# Patient Record
Sex: Female | Born: 1950
Health system: Southern US, Community
[De-identification: ages and names within clinical notes are randomized; demographics above are authoritative.]

## PROBLEM LIST (undated history)

## (undated) DIAGNOSIS — I10 Essential (primary) hypertension: Secondary | ICD-10-CM

## (undated) DIAGNOSIS — I509 Heart failure, unspecified: Secondary | ICD-10-CM

## (undated) DIAGNOSIS — G8190 Hemiplegia, unspecified affecting unspecified side: Secondary | ICD-10-CM

## (undated) DIAGNOSIS — D571 Sickle-cell disease without crisis: Secondary | ICD-10-CM

## (undated) DIAGNOSIS — I639 Cerebral infarction, unspecified: Secondary | ICD-10-CM

## (undated) DIAGNOSIS — R569 Unspecified convulsions: Secondary | ICD-10-CM

## (undated) DIAGNOSIS — E559 Vitamin D deficiency, unspecified: Secondary | ICD-10-CM

## (undated) DIAGNOSIS — D126 Benign neoplasm of colon, unspecified: Secondary | ICD-10-CM

## (undated) DIAGNOSIS — E785 Hyperlipidemia, unspecified: Secondary | ICD-10-CM

## (undated) HISTORY — DX: Unspecified convulsions: R56.9

## (undated) HISTORY — DX: Heart failure, unspecified: I50.9

## (undated) HISTORY — PX: HIP SURGERY: SHX245

## (undated) HISTORY — DX: Hyperlipidemia, unspecified: E78.5

## (undated) HISTORY — PX: TUBAL LIGATION: SHX77

## (undated) HISTORY — DX: Sickle-cell disease without crisis: D57.1

## (undated) HISTORY — DX: Hemiplegia, unspecified affecting unspecified side: G81.90

## (undated) HISTORY — PX: ABDOMINAL HYSTERECTOMY: SHX81

## (undated) HISTORY — DX: Cerebral infarction, unspecified: I63.9

## (undated) HISTORY — PX: FRACTURE SURGERY: SHX138

## (undated) HISTORY — DX: Benign neoplasm of colon, unspecified: D12.6

## (undated) HISTORY — DX: Vitamin D deficiency, unspecified: E55.9

---

## 1998-04-28 ENCOUNTER — Other Ambulatory Visit: Admission: RE | Admit: 1998-04-28 | Discharge: 1998-04-28 | Payer: Self-pay | Admitting: Obstetrics and Gynecology

## 2000-05-29 ENCOUNTER — Ambulatory Visit (HOSPITAL_COMMUNITY): Admission: RE | Admit: 2000-05-29 | Discharge: 2000-05-29 | Payer: Self-pay | Admitting: Gastroenterology

## 2000-06-03 ENCOUNTER — Other Ambulatory Visit: Admission: RE | Admit: 2000-06-03 | Discharge: 2000-06-03 | Payer: Self-pay | Admitting: Obstetrics and Gynecology

## 2003-08-27 ENCOUNTER — Ambulatory Visit (HOSPITAL_COMMUNITY): Admission: RE | Admit: 2003-08-27 | Discharge: 2003-08-27 | Payer: Self-pay | Admitting: Gastroenterology

## 2005-08-23 ENCOUNTER — Emergency Department (HOSPITAL_COMMUNITY): Admission: EM | Admit: 2005-08-23 | Discharge: 2005-08-23 | Payer: Self-pay | Admitting: Emergency Medicine

## 2005-09-06 ENCOUNTER — Ambulatory Visit (HOSPITAL_COMMUNITY): Admission: RE | Admit: 2005-09-06 | Discharge: 2005-09-06 | Payer: Self-pay | Admitting: Family Medicine

## 2007-01-12 ENCOUNTER — Emergency Department (HOSPITAL_COMMUNITY): Admission: EM | Admit: 2007-01-12 | Discharge: 2007-01-12 | Payer: Self-pay | Admitting: Emergency Medicine

## 2007-04-12 ENCOUNTER — Emergency Department (HOSPITAL_COMMUNITY): Admission: EM | Admit: 2007-04-12 | Discharge: 2007-04-12 | Payer: Self-pay | Admitting: Emergency Medicine

## 2007-12-23 DIAGNOSIS — I639 Cerebral infarction, unspecified: Secondary | ICD-10-CM

## 2007-12-23 HISTORY — DX: Cerebral infarction, unspecified: I63.9

## 2008-01-13 ENCOUNTER — Ambulatory Visit: Payer: Self-pay | Admitting: Cardiology

## 2008-01-13 ENCOUNTER — Inpatient Hospital Stay (HOSPITAL_COMMUNITY): Admission: EM | Admit: 2008-01-13 | Discharge: 2008-02-04 | Payer: Self-pay | Admitting: Emergency Medicine

## 2008-01-14 ENCOUNTER — Encounter (INDEPENDENT_AMBULATORY_CARE_PROVIDER_SITE_OTHER): Payer: Self-pay | Admitting: Neurology

## 2008-01-15 ENCOUNTER — Encounter (INDEPENDENT_AMBULATORY_CARE_PROVIDER_SITE_OTHER): Payer: Self-pay | Admitting: Neurology

## 2008-01-20 ENCOUNTER — Ambulatory Visit: Payer: Self-pay | Admitting: Physical Medicine & Rehabilitation

## 2008-01-23 DIAGNOSIS — E559 Vitamin D deficiency, unspecified: Secondary | ICD-10-CM

## 2008-01-23 HISTORY — DX: Vitamin D deficiency, unspecified: E55.9

## 2008-01-27 ENCOUNTER — Encounter (INDEPENDENT_AMBULATORY_CARE_PROVIDER_SITE_OTHER): Payer: Self-pay | Admitting: Neurology

## 2008-08-03 ENCOUNTER — Encounter: Admission: RE | Admit: 2008-08-03 | Discharge: 2008-09-01 | Payer: Self-pay | Admitting: Pediatrics

## 2008-08-26 ENCOUNTER — Ambulatory Visit: Payer: Self-pay | Admitting: Family Medicine

## 2008-08-26 ENCOUNTER — Inpatient Hospital Stay (HOSPITAL_COMMUNITY): Admission: EM | Admit: 2008-08-26 | Discharge: 2008-09-03 | Payer: Self-pay | Admitting: Emergency Medicine

## 2008-10-14 ENCOUNTER — Encounter: Admission: RE | Admit: 2008-10-14 | Discharge: 2009-01-03 | Payer: Self-pay | Admitting: Orthopaedic Surgery

## 2008-10-29 ENCOUNTER — Encounter: Admission: RE | Admit: 2008-10-29 | Discharge: 2009-01-03 | Payer: Self-pay | Admitting: Pediatrics

## 2009-02-16 DIAGNOSIS — I639 Cerebral infarction, unspecified: Secondary | ICD-10-CM | POA: Insufficient documentation

## 2009-02-16 DIAGNOSIS — Z7901 Long term (current) use of anticoagulants: Secondary | ICD-10-CM | POA: Insufficient documentation

## 2009-03-07 ENCOUNTER — Encounter: Admission: RE | Admit: 2009-03-07 | Discharge: 2009-04-20 | Payer: Self-pay | Admitting: Family

## 2009-09-03 IMAGING — CR DG ABD PORTABLE 1V
1 series · 1 of 1 positions shown · non-contrast
Comparison: 01/18/2008.

CLINICAL DATA: Feeding tube placement.

ABDOMEN - 1 VIEW

[view not recorded]
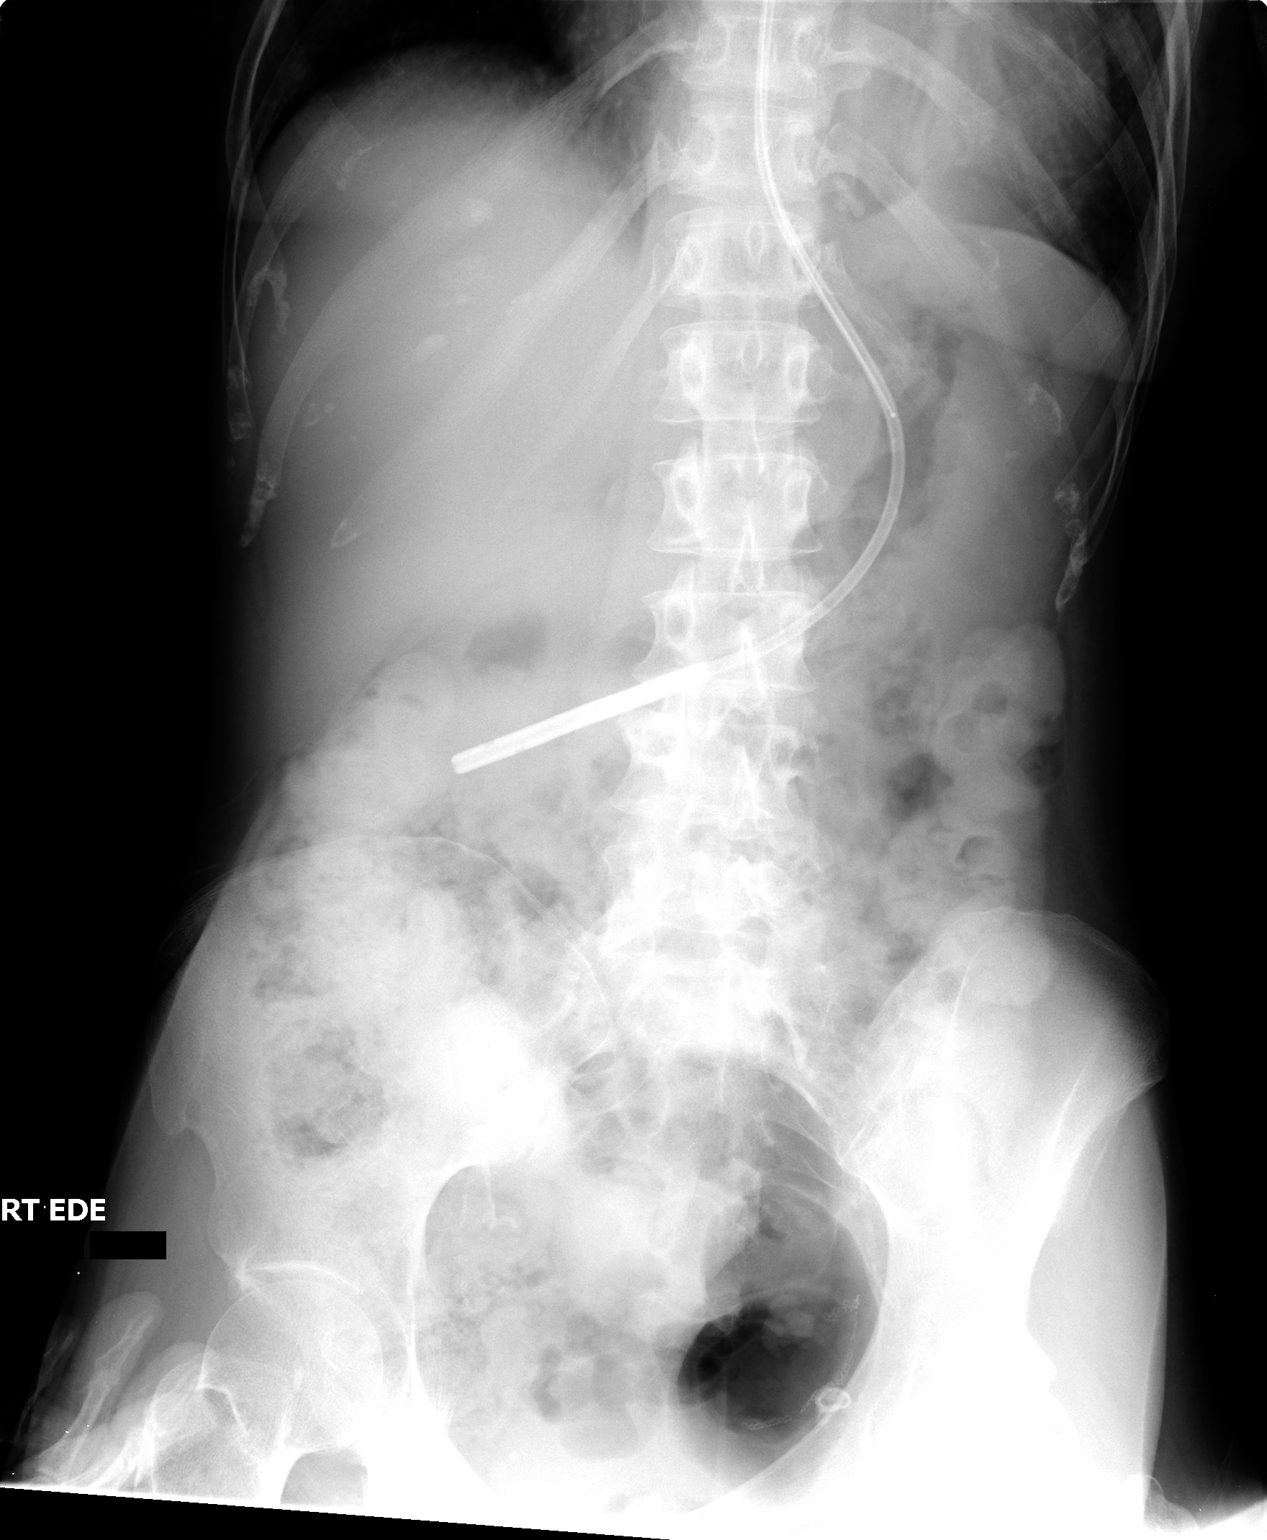

[1 of 1 positions shown; findings below may reference images not displayed]

FINDINGS: The tip of the feeding tube is in the distal stomach, in
the antral pyloric region.  The tip is directed towards the
pylorus.  Bowel gas pattern is nonspecific with contrast material
visible in the colon.  Atelectasis or infiltrate is seen in the
retrocardiac left lung base.
IMPRESSION: Feeding tube tip is in the distal stomach, directed towards the
pylorus.

## 2010-04-29 LAB — CBC
HCT: 25.9 % — ABNORMAL LOW (ref 36.0–46.0)
HCT: 30.6 % — ABNORMAL LOW (ref 36.0–46.0)
HCT: 36.1 % (ref 36.0–46.0)
Hemoglobin: 9 g/dL — ABNORMAL LOW (ref 12.0–15.0)
MCHC: 33.5 g/dL (ref 30.0–36.0)
MCHC: 33.5 g/dL (ref 30.0–36.0)
MCHC: 33.6 g/dL (ref 30.0–36.0)
MCHC: 33.8 g/dL (ref 30.0–36.0)
MCV: 85.2 fL (ref 78.0–100.0)
MCV: 86 fL (ref 78.0–100.0)
Platelets: 241 10*3/uL (ref 150–400)
Platelets: 243 10*3/uL (ref 150–400)
Platelets: 269 10*3/uL (ref 150–400)
Platelets: 276 10*3/uL (ref 150–400)
Platelets: 276 10*3/uL (ref 150–400)
Platelets: 285 10*3/uL (ref 150–400)
Platelets: 288 10*3/uL (ref 150–400)
Platelets: 322 10*3/uL (ref 150–400)
RBC: 3.03 MIL/uL — ABNORMAL LOW (ref 3.87–5.11)
RBC: 3.43 MIL/uL — ABNORMAL LOW (ref 3.87–5.11)
RBC: 3.65 MIL/uL — ABNORMAL LOW (ref 3.87–5.11)
RDW: 13.7 % (ref 11.5–15.5)
RDW: 14 % (ref 11.5–15.5)
RDW: 14.1 % (ref 11.5–15.5)
RDW: 14.1 % (ref 11.5–15.5)
RDW: 14.2 % (ref 11.5–15.5)
WBC: 10.3 10*3/uL (ref 4.0–10.5)
WBC: 12.9 10*3/uL — ABNORMAL HIGH (ref 4.0–10.5)
WBC: 13.9 10*3/uL — ABNORMAL HIGH (ref 4.0–10.5)
WBC: 8.9 10*3/uL (ref 4.0–10.5)

## 2010-04-29 LAB — BASIC METABOLIC PANEL
BUN: 11 mg/dL (ref 6–23)
BUN: 17 mg/dL (ref 6–23)
BUN: 18 mg/dL (ref 6–23)
BUN: 21 mg/dL (ref 6–23)
CO2: 24 mEq/L (ref 19–32)
CO2: 26 mEq/L (ref 19–32)
CO2: 27 mEq/L (ref 19–32)
Calcium: 8.5 mg/dL (ref 8.4–10.5)
Calcium: 8.6 mg/dL (ref 8.4–10.5)
Calcium: 8.7 mg/dL (ref 8.4–10.5)
Calcium: 8.7 mg/dL (ref 8.4–10.5)
Calcium: 8.9 mg/dL (ref 8.4–10.5)
Creatinine, Ser: 0.6 mg/dL (ref 0.4–1.2)
Creatinine, Ser: 0.67 mg/dL (ref 0.4–1.2)
Creatinine, Ser: 0.67 mg/dL (ref 0.4–1.2)
Creatinine, Ser: 0.73 mg/dL (ref 0.4–1.2)
Creatinine, Ser: 0.85 mg/dL (ref 0.4–1.2)
GFR calc Af Amer: >60 mL/min (ref >60)
GFR calc Af Amer: >60 mL/min (ref >60)
GFR calc Af Amer: >60 mL/min (ref >60)
GFR calc non Af Amer: >60 mL/min (ref >60)
GFR calc non Af Amer: >60 mL/min (ref >60)
GFR calc non Af Amer: >60 mL/min (ref >60)
GFR calc non Af Amer: >60 mL/min (ref >60)
Glucose, Bld: 100 mg/dL — ABNORMAL HIGH (ref 70–99)
Glucose, Bld: 101 mg/dL — ABNORMAL HIGH (ref 70–99)
Glucose, Bld: 113 mg/dL — ABNORMAL HIGH (ref 70–99)
Glucose, Bld: 98 mg/dL (ref 70–99)
Potassium: 3.7 mEq/L (ref 3.5–5.1)
Potassium: 4.1 mEq/L (ref 3.5–5.1)
Potassium: 4.4 mEq/L (ref 3.5–5.1)
Sodium: 133 mEq/L — ABNORMAL LOW (ref 135–145)
Sodium: 135 mEq/L (ref 135–145)

## 2010-04-29 LAB — URINE MICROSCOPIC-ADD ON

## 2010-04-29 LAB — CROSSMATCH
ABO/RH(D): O POS
Antibody Screen: NEGATIVE

## 2010-04-29 LAB — DIFFERENTIAL
Basophils Absolute: 0.1 10*3/uL (ref 0.0–0.1)
Lymphocytes Relative: 9 % — ABNORMAL LOW (ref 12–46)
Monocytes Absolute: 0.8 10*3/uL (ref 0.1–1.0)
Neutro Abs: 11 10*3/uL — ABNORMAL HIGH (ref 1.7–7.7)

## 2010-04-29 LAB — POCT CARDIAC MARKERS: Troponin i, poc: <0.05 ng/mL (ref 0.00–0.09)

## 2010-04-29 LAB — URINE CULTURE
Colony Count: NO GROWTH
Culture: NO GROWTH

## 2010-04-29 LAB — PROTIME-INR
INR: 1.6 — ABNORMAL HIGH (ref 0.00–1.49)
INR: 2 — ABNORMAL HIGH (ref 0.00–1.49)
INR: 2.2 — ABNORMAL HIGH (ref 0.00–1.49)
INR: 2.6 — ABNORMAL HIGH (ref 0.00–1.49)
INR: 5.8 (ref 0.00–1.49)
Prothrombin Time: 14.3 seconds (ref 11.6–15.2)
Prothrombin Time: 16.7 seconds — ABNORMAL HIGH (ref 11.6–15.2)
Prothrombin Time: 22.2 seconds — ABNORMAL HIGH (ref 11.6–15.2)
Prothrombin Time: 51.5 seconds — ABNORMAL HIGH (ref 11.6–15.2)
Prothrombin Time: 52 seconds — ABNORMAL HIGH (ref 11.6–15.2)

## 2010-04-29 LAB — COMPREHENSIVE METABOLIC PANEL
Albumin: 4.2 g/dL (ref 3.5–5.2)
BUN: 15 mg/dL (ref 6–23)
Calcium: 9.2 mg/dL (ref 8.4–10.5)
Chloride: 104 mEq/L (ref 96–112)
Creatinine, Ser: 0.65 mg/dL (ref 0.4–1.2)
Total Bilirubin: 0.7 mg/dL (ref 0.3–1.2)
Total Protein: 7.6 g/dL (ref 6.0–8.3)

## 2010-04-29 LAB — URINALYSIS, ROUTINE W REFLEX MICROSCOPIC
Glucose, UA: NEGATIVE mg/dL
Ketones, ur: 15 mg/dL — AB
pH: 6 (ref 5.0–8.0)

## 2010-04-29 LAB — BRAIN NATRIURETIC PEPTIDE: Pro B Natriuretic peptide (BNP): <30 pg/mL (ref 0.0–100.0)

## 2010-04-29 LAB — APTT: aPTT: 63 seconds — ABNORMAL HIGH (ref 24–37)

## 2010-04-29 LAB — DIGOXIN LEVEL: Digoxin Level: 1.1 ng/mL (ref 0.8–2.0)

## 2010-05-08 LAB — PROTIME-INR
INR: 1 (ref 0.00–1.49)
INR: 1 (ref 0.00–1.49)
INR: 1.1 (ref 0.00–1.49)
INR: 1.1 (ref 0.00–1.49)
Prothrombin Time: 13.1 seconds (ref 11.6–15.2)
Prothrombin Time: 13.5 seconds (ref 11.6–15.2)
Prothrombin Time: 14.1 seconds (ref 11.6–15.2)
Prothrombin Time: 14.3 seconds (ref 11.6–15.2)

## 2010-05-08 LAB — CBC
HCT: 29.6 % — ABNORMAL LOW (ref 36.0–46.0)
HCT: 34.3 % — ABNORMAL LOW (ref 36.0–46.0)
HCT: 34.5 % — ABNORMAL LOW (ref 36.0–46.0)
HCT: 36.6 % (ref 36.0–46.0)
HCT: 36.7 % (ref 36.0–46.0)
HCT: 36.8 % (ref 36.0–46.0)
HCT: 38.9 % (ref 36.0–46.0)
Hemoglobin: 11 g/dL — ABNORMAL LOW (ref 12.0–15.0)
Hemoglobin: 11.6 g/dL — ABNORMAL LOW (ref 12.0–15.0)
Hemoglobin: 12 g/dL (ref 12.0–15.0)
Hemoglobin: 12.3 g/dL (ref 12.0–15.0)
Hemoglobin: 12.4 g/dL (ref 12.0–15.0)
Hemoglobin: 9.6 g/dL — ABNORMAL LOW (ref 12.0–15.0)
Hemoglobin: 9.8 g/dL — ABNORMAL LOW (ref 12.0–15.0)
MCHC: 32.3 g/dL (ref 30.0–36.0)
MCHC: 32.6 g/dL (ref 30.0–36.0)
MCHC: 32.7 g/dL (ref 30.0–36.0)
MCHC: 32.9 g/dL (ref 30.0–36.0)
MCHC: 33 g/dL (ref 30.0–36.0)
MCHC: 33.1 g/dL (ref 30.0–36.0)
MCV: 89.9 fL (ref 78.0–100.0)
MCV: 90 fL (ref 78.0–100.0)
MCV: 90.5 fL (ref 78.0–100.0)
MCV: 90.8 fL (ref 78.0–100.0)
MCV: 90.9 fL (ref 78.0–100.0)
MCV: 91.7 fL (ref 78.0–100.0)
MCV: 91.8 fL (ref 78.0–100.0)
MCV: 92.1 fL (ref 78.0–100.0)
Platelets: 324 10*3/uL (ref 150–400)
Platelets: 389 10*3/uL (ref 150–400)
Platelets: 402 10*3/uL — ABNORMAL HIGH (ref 150–400)
Platelets: 403 10*3/uL — ABNORMAL HIGH (ref 150–400)
Platelets: 418 10*3/uL — ABNORMAL HIGH (ref 150–400)
Platelets: 424 10*3/uL — ABNORMAL HIGH (ref 150–400)
RBC: 3.23 MIL/uL — ABNORMAL LOW (ref 3.87–5.11)
RBC: 3.3 MIL/uL — ABNORMAL LOW (ref 3.87–5.11)
RBC: 3.47 MIL/uL — ABNORMAL LOW (ref 3.87–5.11)
RBC: 3.56 MIL/uL — ABNORMAL LOW (ref 3.87–5.11)
RBC: 3.79 MIL/uL — ABNORMAL LOW (ref 3.87–5.11)
RBC: 3.84 MIL/uL — ABNORMAL LOW (ref 3.87–5.11)
RBC: 4 MIL/uL (ref 3.87–5.11)
RBC: 4.01 MIL/uL (ref 3.87–5.11)
RBC: 4.06 MIL/uL (ref 3.87–5.11)
RDW: 13.9 % (ref 11.5–15.5)
RDW: 13.9 % (ref 11.5–15.5)
RDW: 14 % (ref 11.5–15.5)
RDW: 14.2 % (ref 11.5–15.5)
RDW: 14.7 % (ref 11.5–15.5)
RDW: 14.7 % (ref 11.5–15.5)
WBC: 10 10*3/uL (ref 4.0–10.5)
WBC: 10.6 10*3/uL — ABNORMAL HIGH (ref 4.0–10.5)
WBC: 10.9 10*3/uL — ABNORMAL HIGH (ref 4.0–10.5)
WBC: 11.3 10*3/uL — ABNORMAL HIGH (ref 4.0–10.5)
WBC: 8 10*3/uL (ref 4.0–10.5)
WBC: 9 10*3/uL (ref 4.0–10.5)
WBC: 9.2 10*3/uL (ref 4.0–10.5)

## 2010-05-08 LAB — BASIC METABOLIC PANEL
BUN: 14 mg/dL (ref 6–23)
BUN: 15 mg/dL (ref 6–23)
BUN: 19 mg/dL (ref 6–23)
CO2: 25 mEq/L (ref 19–32)
CO2: 25 mEq/L (ref 19–32)
CO2: 25 mEq/L (ref 19–32)
CO2: 27 mEq/L (ref 19–32)
CO2: 27 mEq/L (ref 19–32)
Calcium: 8.6 mg/dL (ref 8.4–10.5)
Calcium: 8.9 mg/dL (ref 8.4–10.5)
Calcium: 9 mg/dL (ref 8.4–10.5)
Calcium: 9 mg/dL (ref 8.4–10.5)
Chloride: 100 mEq/L (ref 96–112)
Chloride: 101 mEq/L (ref 96–112)
Chloride: 101 mEq/L (ref 96–112)
Chloride: 106 mEq/L (ref 96–112)
Chloride: 106 mEq/L (ref 96–112)
Creatinine, Ser: 0.45 mg/dL (ref 0.4–1.2)
Creatinine, Ser: 0.48 mg/dL (ref 0.4–1.2)
Creatinine, Ser: 0.5 mg/dL (ref 0.4–1.2)
Creatinine, Ser: 0.54 mg/dL (ref 0.4–1.2)
Creatinine, Ser: 0.57 mg/dL (ref 0.4–1.2)
Creatinine, Ser: 0.64 mg/dL (ref 0.4–1.2)
GFR calc Af Amer: >60 mL/min (ref >60)
GFR calc Af Amer: >60 mL/min (ref >60)
GFR calc Af Amer: >60 mL/min (ref >60)
GFR calc Af Amer: >60 mL/min (ref >60)
GFR calc Af Amer: >60 mL/min (ref >60)
GFR calc Af Amer: >60 mL/min (ref >60)
GFR calc Af Amer: >60 mL/min (ref >60)
GFR calc Af Amer: >60 mL/min (ref >60)
GFR calc Af Amer: >60 mL/min (ref >60)
GFR calc Af Amer: >60 mL/min (ref >60)
GFR calc non Af Amer: >60 mL/min (ref >60)
GFR calc non Af Amer: >60 mL/min (ref >60)
GFR calc non Af Amer: >60 mL/min (ref >60)
GFR calc non Af Amer: >60 mL/min (ref >60)
GFR calc non Af Amer: >60 mL/min (ref >60)
GFR calc non Af Amer: >60 mL/min (ref >60)
GFR calc non Af Amer: >60 mL/min (ref >60)
GFR calc non Af Amer: >60 mL/min (ref >60)
Glucose, Bld: 101 mg/dL — ABNORMAL HIGH (ref 70–99)
Glucose, Bld: 104 mg/dL — ABNORMAL HIGH (ref 70–99)
Glucose, Bld: 121 mg/dL — ABNORMAL HIGH (ref 70–99)
Potassium: 4.5 mEq/L (ref 3.5–5.1)
Potassium: 4.6 mEq/L (ref 3.5–5.1)
Potassium: 4.6 mEq/L (ref 3.5–5.1)
Potassium: 4.7 mEq/L (ref 3.5–5.1)
Potassium: 5.1 mEq/L (ref 3.5–5.1)
Sodium: 133 mEq/L — ABNORMAL LOW (ref 135–145)
Sodium: 134 mEq/L — ABNORMAL LOW (ref 135–145)
Sodium: 134 mEq/L — ABNORMAL LOW (ref 135–145)
Sodium: 135 mEq/L (ref 135–145)
Sodium: 136 mEq/L (ref 135–145)
Sodium: 137 mEq/L (ref 135–145)
Sodium: 138 mEq/L (ref 135–145)

## 2010-05-08 LAB — HEPARIN LEVEL (UNFRACTIONATED)
Heparin Unfractionated: 0.19 IU/mL — ABNORMAL LOW (ref 0.30–0.70)
Heparin Unfractionated: 0.22 IU/mL — ABNORMAL LOW (ref 0.30–0.70)
Heparin Unfractionated: 0.34 IU/mL (ref 0.30–0.70)
Heparin Unfractionated: 0.36 IU/mL (ref 0.30–0.70)
Heparin Unfractionated: 0.41 IU/mL (ref 0.30–0.70)
Heparin Unfractionated: 0.42 IU/mL (ref 0.30–0.70)
Heparin Unfractionated: 0.47 IU/mL (ref 0.30–0.70)
Heparin Unfractionated: 0.48 IU/mL (ref 0.30–0.70)
Heparin Unfractionated: 0.56 IU/mL (ref 0.30–0.70)
Heparin Unfractionated: 0.61 IU/mL (ref 0.30–0.70)
Heparin Unfractionated: 0.62 IU/mL (ref 0.30–0.70)
Heparin Unfractionated: 0.76 IU/mL — ABNORMAL HIGH (ref 0.30–0.70)
Heparin Unfractionated: <0.1 IU/mL — ABNORMAL LOW (ref 0.30–0.70)

## 2010-05-08 LAB — GLUCOSE, CAPILLARY
Glucose-Capillary: 100 mg/dL — ABNORMAL HIGH (ref 70–99)
Glucose-Capillary: 102 mg/dL — ABNORMAL HIGH (ref 70–99)
Glucose-Capillary: 107 mg/dL — ABNORMAL HIGH (ref 70–99)
Glucose-Capillary: 113 mg/dL — ABNORMAL HIGH (ref 70–99)
Glucose-Capillary: 121 mg/dL — ABNORMAL HIGH (ref 70–99)
Glucose-Capillary: 122 mg/dL — ABNORMAL HIGH (ref 70–99)
Glucose-Capillary: 123 mg/dL — ABNORMAL HIGH (ref 70–99)
Glucose-Capillary: 124 mg/dL — ABNORMAL HIGH (ref 70–99)
Glucose-Capillary: 127 mg/dL — ABNORMAL HIGH (ref 70–99)
Glucose-Capillary: 131 mg/dL — ABNORMAL HIGH (ref 70–99)
Glucose-Capillary: 131 mg/dL — ABNORMAL HIGH (ref 70–99)
Glucose-Capillary: 132 mg/dL — ABNORMAL HIGH (ref 70–99)
Glucose-Capillary: 136 mg/dL — ABNORMAL HIGH (ref 70–99)
Glucose-Capillary: 136 mg/dL — ABNORMAL HIGH (ref 70–99)
Glucose-Capillary: 137 mg/dL — ABNORMAL HIGH (ref 70–99)
Glucose-Capillary: 140 mg/dL — ABNORMAL HIGH (ref 70–99)
Glucose-Capillary: 142 mg/dL — ABNORMAL HIGH (ref 70–99)
Glucose-Capillary: 144 mg/dL — ABNORMAL HIGH (ref 70–99)
Glucose-Capillary: 145 mg/dL — ABNORMAL HIGH (ref 70–99)
Glucose-Capillary: 145 mg/dL — ABNORMAL HIGH (ref 70–99)
Glucose-Capillary: 145 mg/dL — ABNORMAL HIGH (ref 70–99)
Glucose-Capillary: 154 mg/dL — ABNORMAL HIGH (ref 70–99)

## 2010-05-08 LAB — MAGNESIUM
Magnesium: 2 mg/dL (ref 1.5–2.5)
Magnesium: 2 mg/dL (ref 1.5–2.5)
Magnesium: 2.1 mg/dL (ref 1.5–2.5)
Magnesium: 2.2 mg/dL (ref 1.5–2.5)
Magnesium: 2.3 mg/dL (ref 1.5–2.5)

## 2010-05-08 LAB — URINALYSIS, ROUTINE W REFLEX MICROSCOPIC
Glucose, UA: NEGATIVE mg/dL
Nitrite: POSITIVE — AB
pH: 7.5 (ref 5.0–8.0)

## 2010-05-08 LAB — BRAIN NATRIURETIC PEPTIDE
Pro B Natriuretic peptide (BNP): 105 pg/mL — ABNORMAL HIGH (ref 0.0–100.0)
Pro B Natriuretic peptide (BNP): 105 pg/mL — ABNORMAL HIGH (ref 0.0–100.0)
Pro B Natriuretic peptide (BNP): 147 pg/mL — ABNORMAL HIGH (ref 0.0–100.0)
Pro B Natriuretic peptide (BNP): 74 pg/mL (ref 0.0–100.0)
Pro B Natriuretic peptide (BNP): 81 pg/mL (ref 0.0–100.0)
Pro B Natriuretic peptide (BNP): 89 pg/mL (ref 0.0–100.0)

## 2010-05-08 LAB — URINE MICROSCOPIC-ADD ON

## 2010-05-08 LAB — TYPE AND SCREEN
ABO/RH(D): O POS
Antibody Screen: NEGATIVE

## 2010-06-06 NOTE — H&P (Signed)
NAME:  [name], [name]              ACCOUNT NO.:  [id number]   MEDICAL RECORD NO.:  [id number]          PATIENT TYPE:  INP   LOCATION:  4733                         FACILITY:  [facility]   PHYSICIAN:  Wayne A. Sheffield Slider, M.D.    DATE OF BIRTH:  [birth year]   DATE OF ADMISSION:  08/26/2008  DATE OF DISCHARGE:                              HISTORY & PHYSICAL   PRIMARY CARE PHYSICIAN:  [facility].   NEUROLOGY:  Deanna Artis. Hickling, MD   CHIEF COMPLAINT:  Right hip fracture, supratherapeutic INR.   CODE STATUS:  Per patient, she would wish to be DNR/DNI; however  daughter is health care power of attorney.   HISTORY OF PRESENT ILLNESS:  This is a 60 year old female with past  medical history significant for hypertension, CVA, systolic CHF, seizure  disorder, on Coumadin therapy who presents with right intertrochanteric  hip fracture.  History is given per the patient and caregiver.  Patient  with right hemiparesis, expressive aphasia at baseline.  The patient was  on her way to the bathroom at 3 a.m. on August 24, 2008, using her four  prong cane when her right foot got caught up and she tripped and fell on  her right side.  The patient denies hitting head, loss of consciousness,  seizure activity, chest pain, dyspnea, presyncope, nausea, vomiting,  diarrhea, fever, urinary, or bladder issues.  The patient has had  worsening hip pain and unable to bear weight since fall.  The patient  reports swelling and bruising in right upper extremity and right hip.  The patient is on Coumadin per caregiver, last appointment for INR check  was in May 2010.   PAST MEDICAL HISTORY:  1. Left MCA CVA in December 2009 on Coumadin therapy.  2. Hypertension.  3. Systolic CHF with EF of 15% to 20% secondary to ischemic      cardiomyopathy.  4. Seizure disorder on Keppra, last seizure July 2009.  5. Small hiatal hernia.  6. History of right basal ganglia infarct several years ago.  7. Former  tobacco abuse.   PAST SURGICAL HISTORY:  None.   ALLERGIES:  None.   MEDICATIONS:  1. Keppra 750 mg p.o. q.12 h.  2. Claritin 10 mg p.o. q.a.m.  3. Lisinopril 10 mg p.o. daily.  4. Crestor 20 mg p.o. at bedtime.  5. Coreg 6.25 mg p.o. b.i.d.  6. Digoxin 0.25 mg p.o. q.a.m.  7. Isordil 20 mg p.o. t.i.d.  8. Prilosec 20 mg p.o. daily.  9. Coumadin 10 mg p.o. daily.   FAMILY HISTORY:  Unable to obtain secondary to patient aphasia.   SOCIAL HISTORY:  Lives at home with husband.  Has caregiver 5 days a  week, during day.  The patient receives speech therapy, physical  therapy, occupational therapy weekly.  The patient denies alcohol or  drug use.  The patient is a former smoker.  Daughter, Marylene Land is health  care power of attorney, [phone].   REVIEW OF SYSTEMS:  As per HPI, otherwise negative.   PHYSICAL EXAMINATION:  VITAL SIGNS:  Temperature 98.9, heart rate 95,  blood pressure 127/67, respiratory rate 20, 98% on room air.  GENERAL:  NAD, alert, expressive aphasia; however, able to answer by  shaking and nodding head and occasional one-word answers.  HEENT:  NCAT, PERRL, EOMI, moist mucous membranes.  NECK:  Supple.  No carotid bruits.  LUNGS:  Clear to auscultation bilaterally anteriorly, good work of  breathing.  CARDIOVASCULAR:  Chest nontender.  Regular rate and rhythm.  No murmurs  or rubs, 2+ radials, faint pedal pulses bilaterally.  ABDOMEN:  Soft, nontender, nondistended, positive bowel sounds.  MSK:  4-/5 strength left lower extremity, not moving right upper or  lower extremities.  The patient unwilling to have this exam in lower  extremities secondary to pain.  Large bruise lateral surface of right  upper arm and shoulder.  NEUROLOGIC:  Cranial nerves II through XII grossly intact, right  hemiparesis upper and lower extremity.  Again the patient unwilling  after a while to participate in neurological exam secondary to pain.  EXTREMITIES:  No edema or  cyanosis.   LABORATORY DATA:  BMET, sodium 138, potassium 3.9, chloride 104, bicarb  24, BUN 15, creatinine 0.65, glucose 123, AST 25, ALT 14, protein 7.6,  albumin 4.2, calcium 9.2.  CBC is as follows; white blood cells 6.9,  hemoglobin 12.3, hematocrit 36.2, platelets 233, INR 5.8.  Chest x-ray,  no active disease.  Head CT, remote large left MCA infarction and  additional remote lacunar-type infarctions.  No definite acute  intracranial finding by noncontrast CT.  Right temporal scalp  hematoma/swelling without underlying fracture.  Right hip film shows  acute right intertrochanteric fracture with an element of impaction.   ASSESSMENT/PLAN:  This is a 60 year old female with a past medical  history significant for CVA on Coumadin, systolic CHF, ejection fraction  15% to 20%, hypertension who presents with right hip fracture and  supratherapeutic INR.  1. Fall with right hip fracture.  Orthopedic is following.  Family      Practice Teaching Service asked to admit for medical optimization      prior to surgical repair.  We will consult pharmacy regarding      reversing anticoagulation with vitamin K versus expectant      management of INR.  Pain control with morphine 1-2 mg IV q.2 h.      p.r.n. with scheduled Tylenol.  We will avoid NSAIDs with bleeding      risk, fracture.  We will obtain records from [facility] with film of      upper extremity to ensure no fracture.  CT negative for      intracranial hemorrhage.  Chest x-ray negative for fracture or      acute disease.  2. Supratherapeutic INR.  INR is 5.8.  We will attempt to obtain      records from [facility] regarding her anticoagulation management.  We      will consult pharmacy regarding reversing anticoagulation in this      patient with surgical repair of right hip fracture as above.  3. Seizure disorder, stable.  We will continue on home dose Keppra.  4. Hypertension, pressure is stable.  We will continue on home dose       Coreg, digoxin, isosorbide dinitrate, lisinopril.  5. Hyperlipidemia, Crestor 20 mg p.o. daily.  6. Prophylaxis.  INR is supratherapeutic.  No heparin at this time.      We will hold off on SCDs with hip fracture.  Protonix in place of  Prilosec.  7. Fluid, electrolytes, nutrition/gastrointestinal.  We will give IV      with normal saline at this time.  Regular diet for now, is not a      candidate for surgery currently with supratherapeutic INR.  8. Disposition.  The patient will go to OR for surgical repair of      right hip fracture after INR is within normal limits, medical      optimization for surgery.      Bobby Rumpf, MD  Electronically Signed      Arnette Norris. Sheffield Slider, M.D.  Electronically Signed    KC/MEDQ  D:  08/26/2008  T:  08/27/2008  Job:  [id number]

## 2010-06-06 NOTE — Op Note (Signed)
NAME:  [name], [name]              ACCOUNT NO.:  [id number]   MEDICAL RECORD NO.:  [id number]          PATIENT TYPE:  INP   LOCATION:  3005                         FACILITY:  [facility]   PHYSICIAN:  Petra Kuba, M.D.    DATE OF BIRTH:  03-Apr-1950   DATE OF PROCEDURE:  01/27/2008  DATE OF DISCHARGE:                               OPERATIVE REPORT   PROCEDURE:  PEG placement.   INDICATION:  Patient with CVA failed swallowing study.  Consent was  signed after risks, benefits, methods, options thoroughly discussed with  multiple family members.   MEDICINES USED:  1. Fentanyl 75 mcg.  2. Versed 3 mg.   PROCEDURE:  The video endoscope was inserted by direct vision following  the feeding tube into her esophagus.  The feeding tube was removed.  The  esophagus was normal.  She did have a small hiatal hernia.  Scope passed  into the stomach and there were a few erosions probably from her feeding  tube in her stomach proximally but no other abnormalities were seen.  Scope was advanced through a normal antrum, pylorus to a normal duodenal  bulb around the C-loop to a normal second portion of the duodenum.  Scope was withdrawn back to the bulb and a good look there ruled out  abnormalities in that location.  Scope was withdrawn back to the stomach  to the distal greater curve.  The light reflex was seen in the  midepigastric area.  One-to-one finger palpation confirmed the proper  position while the abdomen was being dressed and draped.  We went ahead  and evaluated the stomach on retroflex and straight visualization.  No  additional findings were seen except for the hiatal hernia being  confirmed in the cardia.  The scope was slowly withdrawn back to 18 cm  into her esophagus which confirmed its normal appearance.  Scope was  then re-advanced down to the distal greater curve.  Again the light  reflex was confirmed and the proper position of the placing the PEG was  confirmed with one-to-one  finger palpation.  With the assistance of our  physician's assistant Algis Downs, a small wheal was made to numb the  skin using 1% lidocaine in the customary fashion.  A small incision was  used with the blade.  The introducer needle was advanced into the  stomach on the first attempt and the blue [place] cord was advanced  through the introducer, grabbed with the snare and the cord, snare, and  scope were withdrawn through the patient's mouth.  The 24-[employer] [place]  [employer] Pull PEG was attached to the cord in the customary fashion  and withdrawn through the abdominal incision to about the three and  quarter mark while the bumper was being fastened.  The scope was  reinserted.  There were no signs of bleeding or obvious complication.  The proper tension on the bumper was confirmed endoscopically and with  turning.  The scope was removed after air was suctioned.  The PEG was  dressed.  The patient tolerated the procedure well.  There was no  obvious  immediate complication.   ENDOSCOPIC DIAGNOSES:  1. Small hiatal hernia.  2. A few stomach erosions proximally from the nasogastric tube      probably.  3. Otherwise, normal EGD status post 24-[employer] Pull      PEG placed without obvious complication or problem.   PLAN:  Customary post PEG orders.  We will need dilation of the bumper  in a few days and begin pump inhibitors based on her hiatal hernia but  may not need to continue long term.  We will follow with you.  If no  complications, may resume heparin later today without a bolus and  aspirin tomorrow.           ______________________________  Petra Kuba, M.D.     MEM/MEDQ  D:  01/27/2008  T:  01/27/2008  Job:  [id number]   cc:   Pramod P. Pearlean Brownie, MD

## 2010-06-06 NOTE — Op Note (Signed)
NAME:  [name], [name]              ACCOUNT NO.:  [id number]   MEDICAL RECORD NO.:  [id number]          PATIENT TYPE:  INP   LOCATION:  4733                         FACILITY:  [facility]   PHYSICIAN:  Lubertha Basque. Dalldorf, M.D.DATE OF BIRTH:  05/07/1950   DATE OF PROCEDURE:  08/30/2008  DATE OF DISCHARGE:                               OPERATIVE REPORT   PREOPERATIVE DIAGNOSIS:  Right hip intertrochanteric fracture.   POSTOPERATIVE DIAGNOSIS:  Right hip intertrochanteric fracture.   PROCEDURE:  Right hip open reduction and internal fixation.   ANESTHESIA:  General.   ATTENDING SURGEON:  Lubertha Basque. Dalldorf, MD   INDICATIONS FOR PROCEDURE:  The patient is a 60 year old woman who fell  several days ago in an unwitnessed fashion.  She could not get up and  had terrible pain.  By x-ray, she has a minimally displaced  intertrochanteric fracture of the right hip.  She is beset with a stroke  and is on Coumadin.  With an INR of more than 5, we elected to wait on  surgery for several days to let this equilibrate somewhat to a safe  range.  She is offered ORIF and informed operative consent was obtained  after discussion of possible complications including reaction to  anesthesia, infection, DVT, CVA, and death.   SUMMARY OF FINDINGS AND PROCEDURE:  Under general anesthesia through a  lateral incision, a right hip fracture was exposed and stabilized with a  TK2 compression hip screw by DePuy.  We used a 85 length hip screw with  a four-hole 140-degree side plate.  She had good bone quality.  We used  fluoroscopy throughout the case to make appropriate intraoperative  decisions and I read all these views myself.  She was closed primarily  admitted for appropriate care to include immediate resumption of her  Coumadin along with some Lovenox and a course of perioperative  antibiotic.   DESCRIPTION OF PROCEDURE:  The patient was taken to the operating suite  where general anesthetic was applied  without difficulty.  She was  positioned on the fracture table in traction and her fracture was  reduced under fluoroscopic guidance.  She was then prepped and draped in  normal sterile fashion.  After administration of IV Kefzol, an incision  was made on the lateral aspect of the right hip.  Dissection was carried  down to the IT band, which was incised.  Below this, the vastus  lateralis fascia was incised and blunt dissection was carried down to  the full layer of the femur.  I then placed a guidewire up in to the  femoral head, seen to be central on 2 views, and over reamed to a depth  of 80 mm followed by placement of an 85 mm length TK2 hip screw.  We  then affixed a Mellody Dance 140-degree four-hole side plate.  This was affixed  to the femur with four screws achieving bicortical purchase on each.  Fluoroscopy was used to confirm adequate placement of hardware in 2  planes and I read these views myself.  The wound was irrigated followed  by reapproximation of vastus  lateralis with 0-Vicryl in running fashion.  IT band was reapproximated with #1 Vicryl in running fashion.  Subcutaneous tissues were reapproximated with 0 and 2-0 undyed Vicryl in  interrupted fashion and skin was closed with staples.  A Mepilex  dressing was applied.  Estimated blood loss was 50 mL and intraoperative  fluids can be obtained from anesthesia records.   DISPOSITION:  The patient was extubated in the operating room and taken  to recovery room in stable addition.  She was admitted back to the  medical service for appropriate care to include immediate resumption of  her Coumadin and Lovenox along with a course of perioperative  antibiotic.      Lubertha Basque Jerl Santos, M.D.  Electronically Signed     PGD/MEDQ  D:  08/30/2008  T:  08/31/2008  Job:  [id number]

## 2010-06-06 NOTE — H&P (Signed)
NAME:  [name], Keitra              ACCOUNT NO.:  [id number]   MEDICAL RECORD NO.:  [id number]          PATIENT TYPE:  INP   LOCATION:                               FACILITY:  [facility]   PHYSICIAN:  Pramod P. Pearlean Brownie, MD    DATE OF BIRTH:  10-22-1950   DATE OF ADMISSION:  01/13/2008  DATE OF DISCHARGE:                              HISTORY & PHYSICAL   REFERRING PHYSICIAN:  Katherine Roan, MD   REASON FOR REFERRAL:  Code stroke.   HISTORY OF PRESENT ILLNESS:  [name] is a 60 year old African American  lady who was brought in by EMS as a code stroke.  She was apparently  last seen normal at 7:30.  According to EMS when husband noticed, she  had speech difficulties and trouble moving the right side.  However,  after the patient reached here and I spoke to the husband, the husband  stated that he last saw her normal at 2:00 a.m. when he had fallen  asleep watching television on a couch and his wife woke him up and they  both went back to sleep.  At about 7 o'clock, when he woke up, he  noticed that she was mumbling, trying to speak, but unable to get words  out and she was weak on the right side and he had to help her up.  He  called EMS, and code stroke was called en route.  The patient has not  had any witnessed seizure at this time but does have a previous history  of seizure disorder for which she has seen Dr. Nash Shearer in our office,  and she was on Keppra 500 twice a day.  According to the husband, there  have been no recent seizures.  The patient's past neurological history  is significant for right basal ganglia infarction a couple of years ago  with practically no significant residual deficits.   Past medical history also includes hypertension.   Her home medications include aspirin, Hyzaar, Keppra, fosinopril, and  Vytorin.   SOCIAL HISTORY:  The patient is married, lives with her husband.  She  smokes half a pack per day.  He does not drink alcohol.  She is  independent with  activities of daily living.   MEDICATION ALLERGIES:  None known.   FAMILY HISTORY:  Noncontributory.   SOCIAL HISTORY:  As discussed above.   REVIEW OF SYSTEMS:  Negative for any recent fever, cough, shortness of  breath, diarrhea, or other illness.   PHYSICAL EXAMINATION:  GENERAL:  A frail, middle-aged [place] American  lady who is at present not in distress.  VITAL SIGNS:  She is afebrile with pulse rate of 104 per minute,  regular, sinus.  Blood pressure was 161/94, respiratory rate is 20 per  minute but variable.  She has irregular respiration which varies from 20-  40 per minute; however, her saturations are stable.  HEENT:  Head is nontraumatic.  ENT exam is unremarkable.  NECK:  Supple.  There is no bruit.  CARDIAC:  Regular heart sounds.  No gallop or murmur.  NEUROLOGIC:  The patient is  awake.  She is alert.  She is aphasic.  She  is unable to speak any words.  She follows simple midline commands only.  Her tongue deviates to the right, and she sticks it out.  She has  significant right lower facial weakness.  She does not blink to threat  on the right side.  She does follow gaze in all directions.  She has  dense right hemiplegia, 0/5 strength.  She withdraws minimally on the  right side to painful stimuli.  Right plantar is upgoing, left is  downgoing.  Sensation is decreased on the right compared to the left.  On NIH stroke scale, she scored 14.  Modified Rankin is 1.   DATA REVIEWED:  Noncontrast CAT scan of the head done today reveals no  acute abnormality.  Old right basal ganglia infarct of remote age is  noted.   ADMISSION LABORATORY DATA:  Coagulation labs, CBC, and basic chemistries  are all normal.   IMPRESSION:  A 60 year old lady with what appears to be a large left  middle cerebral artery infarct, exact time of onset is unclear, but she  was last seen normal at about 7 hours ago.  The patient does not qualify  for thrombolysis with the tPA or  catheter-based intervention because of  the time of onset.   PLAN:  I had a long discussion with the patient's husband regarding her  symptoms, discussed plan for evaluation and treatment, and answered  questions.  She remains critically ill and at significant risk for  worsening.  The patient may perhaps qualify for the SENTIS acute stroke  intervention trial.  I have discussed this with the patient's husband in  detail, and he was given opportunity to ask questions, which were  answered.  The patient does meet criteria in my opinion and will qualify  for the trial.  We will consider study specific testing only after the  patient's husband signs the consent form.  The patient is critically ill  and at risk for worsening.  I spent 1 hour of critical care time  monitoring and delivering the care and following up on the patient's  status  We will check MRI scan of the brain, admit to the intensive care  unit, and further stroke risk stratification workup.  We will check an  echocardiogram, Doppler studies, stroke risk stratification labs.  Aspirin for now.           ______________________________  Sunny Schlein. Pearlean Brownie, MD     PPS/MEDQ  D:  01/13/2008  T:  01/13/2008  Job:  [id number]

## 2010-06-06 NOTE — Discharge Summary (Signed)
NAME:  [name], Vianne              ACCOUNT NO.:  [id number]   MEDICAL RECORD NO.:  [id number]          PATIENT TYPE:  INP   LOCATION:  3005                         FACILITY:  [facility]   PHYSICIAN:  Pramod P. Pearlean Brownie, MD    DATE OF BIRTH:  [birth year]   DATE OF ADMISSION:  01/13/2008  DATE OF DISCHARGE:  02/03/2008                               DISCHARGE SUMMARY   DISCHARGE DIAGNOSES:  1. Left middle cerebral artery cardioembolic infarct, enrolled in      Synthes trial status post percutaneous endoscopic gastrostomy.  2. Dysphagia secondary to stroke status post percutaneous endoscopic      gastrostomy.  3. Anemia thought to be secondary to frequent blood draws.  No      bleeding source found once bleeding was stopped from percutaneous      endoscopic gastrostomy site insertion which was only minimal over      about 24 hour.  4. Hypertension.  5. Ischemic cardiomyopathy with ejection fraction 15-20%.  6. Elevated cardiac enzymes, questionable significance.  Cardiologist      did not necessarily feel it was indicative of a myocardial necrosis      of a significant/clinical event, i.e., he felt there was no      myocardial infarction.  7. Small hiatal hernia.  8. History of seizure disorder prior to admission.  9. History of right basal ganglia infarct several years ago with no      residual deficits.  10.Cigarette smoker.   DISCHARGE MEDICATIONS:  1. Keppra 750 mg q.12 h.  2. Loratadine 10 mg p.r.n.  3. Crestor 10 mg q.h.s.  4. Lisinopril 10 mg a day.  5. Coreg 12.5 mg b.i.d.  6. Lanoxin 0.25 mg a day.  7. Isosorbide 20/hydralazine 37.5 t.i.d.  8. Protonix 40 mg a day.  9. Coumadin 10 mg a day, adjust until INR is greater than or equal to      2.0.  10.Tube feeding which is Jevity at 55 mL an hour from 7 p.m. to 7 a.m.      daily.   DIAGNOSTICS:  1. CT of the brain on admission shows no acute abnormality.  2. MRI of the brain showed area of acute infarct in the left middle    cerebral artery territory.  3. MRA of the head shows occlusion of the left middle cerebral artery      less than 1 cm from its origin, markedly diminished flow in the      left MCA branch vessels.  4. CT perfusion shows significant increase time to peak diffusion in      left MCA distribution, suspicion for high-grade proximal occlusion      possibly of the left middle cerebral artery.  The near symmetrical      blood volumes in the CT perfusion map suggests possible extensive      left meningeal collateralization via the anterior cerebral and the      posterior cerebral artery branches on the left, probable focal area      of irreversible ischemia just posterior and lateral to the left  putamen.  Follow up CT at 24 hours shows a developing low density      in the left insular subinfarct and corona radiata region.  No      hemorrhage.  5. Chest x-ray shows probable congestive heart failure with bilateral      pneumonia, less likely right greater than left pleural effusions      and that was performed on January 14, 2008.  6. Last chest x-ray performed on January 19, 2008 showed improving      congestive heart failure and bibasilar airspace disease.  7. Abdominal x-rays showed feeding tube in distal stomach directed      towards the pylorus and that was on January 23, 2008.  8. A swallow study on January 29, 2008 allowed dysphagia I nectar-thick      liquid diet.  9. On January 14, 2008, a 2-D echocardiogram showed EF of 25% with      ventricular hypokinesis with severe LV and RV dysfunction and      dilatation of the IVC.  10.Repeat 2-D echo cardiogram 24 hours later showed EF in the 15-20%      range consistent with nondilated cardiomyopathy and severe global      hypokinesis.  No cardiac embolus was seen.  No significant changes      from prior exam.  11.Carotid Doppler shows no ICA stenosis bilaterally.  12.Transesophageal echocardiogram shows EF of 40-45% with mild diffuse       left ventricular hypokinesis, arch atheroma, left atrial appendage      without thrombus identified.  13.  EKG shows sinus rhythm with      short PR, left axis deviation, right bundle branch block and T-wave      abnormality.   LABORATORY DATA:  INR on day of discharge 1.1 with PT of 14.3.  CBC with  hemoglobin 9.6.  Chemistry with glucose 121, otherwise normal.  Urine  pregnancy negative.  Urinalysis with 0-2 white blood cells, 0-2 red  blood cells, no bacteria.  Urine drug screen negative.  Hemoglobin A1c  5.4.  Cardiac enzymes were slightly elevated, troponin 1.2, CK 13.2 and  relative index of 8.5.  Repeat showed troponin 1.17, CK-MB 10.3 and  relative index of 7.6, troponin 1.03, CK 7.8 and index 6.8.  Following  troponin 1.02, CK-MB 6.7 and relative index of 6.3.  There was a  homocystine of 18.9, cholesterol 178, triglycerides 100, HDL 23 and LDL  135.   HISTORY OF PRESENT ILLNESS:  [name] is a 60 year old African  American female who was brought in by EMS as a code stroke.  She was  last seen normal at 7:30.  According to EMS when husband noticed she had  speech difficulties and trouble moving the right side.  However after  the patient reached the hospital, I spoke to the husband who stated she  was last seen normal at 2: a.m. when he had fallen asleep watching TV on  the couch and his wife woke up, and they both went back to sleep.  At  about 7 a.m. when he woke up, he noticed that she was mumbling trying to  speak, but unable to get words out.  She was weak on her right side.  He  had to help her up.  He called EMS and a code stroke was called en  route.  The patient has not had any witnessed seizure, but does have a  history of previous seizure disorder seen  by Dr. Nash Shearer at [facility].  According to her husband, she has had no recent seizures.  The patient has a significant history of right basal ganglia infarct a  few years ago with no  significant residual deficits.  She was not a TPA  candidate secondary to time of onset.  She was a Synthes candidate and  was enrolled in to the trial and was put into the medical treatment arm.  She was admitted to the hospital for further stroke evaluation.   HOSPITAL COURSE:  MRI did confirm an acute stroke in the left MCA  distribution secondary to a left MCA occlusion.  Her stroke was felt to  be embolic secondary to ischemic cardiomyopathy.  [facility] was consulted to perform a TEE to rule out other thrombotic  source as well as to adjust with her medications.  Of note, the patient  did have elevated troponins during her initial hospital stay with  fluctuating periods of apnea.  It was not felt that this was a cardiac  ischemic event.  The patient was moved to the step-down unit for close  monitoring.  She quickly returned to the floor even though she had some  mild congestive heart failure that was improving.  The patient was  placed on Coumadin for low EF for and for secondary stroke prevention.  She was evaluated by speech therapy and was able to be started on a  dysphasia I nectar-thick liquid diet.  At this point, her caloric intake  is not good enough that she can survive on p.o.'s alone and will  continue with tube feedings in the evening hours.   The patient was initially evaluated by rehab, but felt to be unable to  tolerate 3 hours of therapy a day.  Family decided on skilled nursing  facility placement initially wanting her to be in [place] closer to  them and then changed their minds to [place].  At this point, a  hospital bed is available to her.  She is not yet therapeutic on  Coumadin as she has been significantly resistant, this is hospital day  number 7on Coumadin.  We will stop IV heparin at this time and continue  Coumadin without heparin bridge at skilled nursing facility until goal  INR of greater than or equal to 2.0 is reached.   Of note, the patient  did have PEG place during hospitalization secondary to poor p.o. intake  through which her tube feeding is running at night.  The patient remains  severely aphasic with right hemiparesis.  She needs ongoing therapy.   CONDITION ON DISCHARGE:  Awake, alert, expressive aphasia, speaks a few  words.  She follows a few commands, though still likely will have some  receptive aphasia.  Her heart rate is regular.  Her breath sounds are  clear.  She moves her left side and it crosses the midline.  She has  dense right hemiparesis.   DISCHARGE/PLAN:  1. Discharge to a skilled nursing facility for continuation of PT, OT      and speech therapy.  2. Coumadin to goal INR of greater than or equal to 2.0-3.0.  Will      need adjustment at skilled nursing facility and daily PT/INR draws      until therapeutic.  3. Continue ongoing risk factor management.  4. Continue tube feedings at bedtime until p.o. intake increases once      calorie intake is adequate p.o. may stop tube  feedings.  5. The patient is enrolled in Synthes trial and will need follow up      with [name] per their plans.      Annie Main, N.P.    ______________________________  Sunny Schlein. Pearlean Brownie, MD    SB/MEDQ  D:  02/03/2008  T:  02/03/2008  Job:  [id number]   cc:   Pramod P. Pearlean Brownie, MD  Petra Kuba, M.D.  [facility]  Madaline Savage, M.D.  Jonita Albee, M.D.

## 2010-06-06 NOTE — Consult Note (Signed)
NAME:  [name], [name]              ACCOUNT NO.:  [id number]   MEDICAL RECORD NO.:  [id number]          PATIENT TYPE:  INP   LOCATION:  3005                         FACILITY:  [facility]   PHYSICIAN:  Petra Kuba, M.D.    DATE OF BIRTH:  Jul 18, 1950   DATE OF CONSULTATION:  01/26/2008  DATE OF DISCHARGE:                                 CONSULTATION   We were asked to see [name] today in consultation for possible PEG  placement by Dr. Pearlean Brownie of the Stroke Service.   HISTORY OF PRESENT ILLNESS:  This is a patient who has had a previous  stroke who was admitted on January 13, 2008 with a left-sided CVA.  Per  her modified barium swallow study done on January 20, 2008, she has  severe oral dysphagia.  Per followup speech note on January 23, 2008, she  has somewhat improved timeliness of swallow, but still unable to feed  herself and appears to have lack of ability to acknowledge that she  needs nutrition.  The patient does not speak but nods yes to all  questions.  Consequently, history was collected mostly from the chart  and notes.  I spoke with the patient's daughter, [name], who is  aware of the need for PEG tube and she understands the risks of  bleeding, perforation, and sedation.  She wants to discuss the PEG tube  with her father before giving final consent.  [name]'s home phone  number is [phone].  The patient's past medical history is  significant for hypertension.  There are no surgeries listed in E-chart.  Past medical history is also significant for colonoscopy for rectal  bleeding x2 in 2002 and 2005.  Both of these colonoscopies were done by  Dr. Dorena Cookey and were normal except for bleeding internal hemorrhoids.  The patient has a history of right basal ganglia infarct.   Current medications include Aleve, Keppra, loratadine, BC powders,  Vytorin.   She has no known drug allergies.   Review of systems, social history, and family history are per H and  P.   On physical exam, she is awake, drooling, nonverbal but does nod yes to  all my questions.  Her temperature is 97.6, pulse is 76, respirations  18, blood pressure is 119/73.  Heart has a regular rate and rhythm.  Lungs are clear to auscultation without deep inspiration.  Abdomen is  soft, nontender, nondistended with good bowel sounds.  There are no  obvious scars on her abdomen.  She has an NG tube in place, receiving  tube feeds and appears to be tolerating them well.   Current labs show a hemoglobin of 11.8, hematocrit 36.6, white count  8.4, platelets 424,000, BUN is 19, creatinine is 0.51, glucose is 139.   ASSESSMENT:  Dr. Vida Rigger has seen and examined the patient, collected  history and reviewed her chart.  His impression is that this is an  unfortunate 60 year old female who had a stroke in late December and is  unable to sustain herself with nutrition, needs a PEG tube, hopefully  just temporarily.  PLAN:  We will finalize consent with the family and schedule PEG  placement for January 27, 2008.      Stephani Police, PA    ______________________________  Petra Kuba, M.D.    MLY/MEDQ  D:  01/26/2008  T:  01/26/2008  Job:  [id number]

## 2010-06-09 NOTE — Procedures (Signed)
[facility]  Patient:    [name], [name]                     MRN: [id number] Proc. Date: 05/29/00 Adm. Date:  [id number] Attending:  Louie Bun CC:         Rande Brunt. Eda Paschal, M.D.   Procedure Report  PROCEDURE:  Colonoscopy.  SURGEON:  John C. Madilyn Fireman, M.D.  INDICATIONS FOR PROCEDURE:  Diarrhea with fecal incontinence and occasional rectal bleeding in [name] 60 year old patient with no previous colon imaging done.  DESCRIPTION OF PROCEDURE:  The patient was placed in the left lateral decubitus position and placed on the pulse monitor with continuous low flow oxygen delivered by nasal cannula.  She was sedated with 90 mg of IV Demerol and 9 mg of IV Versed.  The Olympus video colonoscope was inserted into the rectum and advanced to the cecum, confirmed by transillumination at McBurneys point, and visualization of the ileocecal valve and appendiceal orifice.  The prep was good.  The cecum, ascending, transverse, descending, and sigmoid colon all appeared normal with no masses, polyps, diverticula, or other mucosal abnormalities.  The rectum likewise appeared normal, and retroflexed view of the anus revealed moderate size internal hemorrhoids with no stigma of hemorrhage.  The colonoscope was then withdrawn and the patient returned to the recovery room in stable condition.  She tolerated the procedure well and there were no immediate complications.  IMPRESSION:  Internal hemorrhoids, otherwise normal colonoscopy.  PLAN:  Will try her on [name] course of Levbid and dietary fiber supplement. Followup in the office in [name] few weeks. DD:  05/29/00 TD:  05/30/00 Job: [id number]

## 2010-06-09 NOTE — Procedures (Signed)
EEG NUMBER:  [id number].   HISTORY:  This 60 year old patient who is being evaluated for episodes of  syncope.  This is a routine EEG.  No skull defection noted.   MEDICATIONS:  Not listed.   EEG CLASSIFICATION:  Normal awake.   DESCRIPTION:  According to the background rhythm, this is coexist of a  fairly well modulated medium amplitude alpha rhythm of 9 Hz as reaction to  eye opening and closure.  As the record progresses, the patient appears to  remain awake and steady throughout the recording period.  Photic stimulation  is performed resulting in the bilateral and symmetric photic drive response.  Hyperventilation is also performed resulting in some minimal build up of the  background rhythm.  Activity is without significant swelling seen.  At no  time during the recording did there appear to be episodes of spike or spike  wave discharges or evidence of focal slowing.  EKG monitor shows no evidence  of cardiac rhythm abnormalities with a heart rate 84.   IMPRESSION:  This is a normal EEG recording awake and steady.  No evidence  of ictal or interictal discharges were seen.      Marlan Palau, M.D.  Electronically Signed     [id number]  D:  09/06/2005 16:05:27  T:  09/06/2005 22:07:06  Job #:  [id number]

## 2010-06-09 NOTE — Discharge Summary (Signed)
NAME:  [name], [name]              ACCOUNT NO.:  [id number]   MEDICAL RECORD NO.:  [id number]          PATIENT TYPE:  INP   LOCATION:  4733                         FACILITY:  [facility]   PHYSICIAN:  Wayne A. Sheffield Slider, M.D.    DATE OF BIRTH:  July 26, 1950   DATE OF ADMISSION:  08/26/2008  DATE OF DISCHARGE:  09/02/2008                               DISCHARGE SUMMARY   PRIMARY CARE Kassius Battiste:  [facility].   DISCHARGE DIAGNOSES:  1. Right intertrochanteric hip fracture.  2. Systolic heart failure secondary to ischemic cardiomyopathy.  3. Seizure disorder.  4. Hypertension.  5. Vitamin D deficiency.  6. Hyperlipidemia.  7. Right hemiparesis.  8. Expressive aphasia.   DISCHARGE MEDICATIONS:  1. Coreg 3.125 mg p.o. b.i.d.  2. Digoxin 0.25 mg p.o. daily.  3. Keppra 750 mg p.o. b.i.d.  4. Zestril 10 mg p.o. daily.  5. Crestor 20 mg p.o. daily.  6. Claritin 10 mg p.o. daily.  7. MiraLax 17 g p.o. at bedtime p.r.n. constipation.  8. Percocet 5 mg/325 mg p.o. q.4 h. p.r.n. pain.  9. Coumadin 5 mg p.o. daily.   DISCONTINUED MEDICATIONS:  Isordil.   CONSULTS:  Orthopedics, Dr. Jerl Santos, we appreciate your assistance with  this patient.   PROCEDURES:  1. Right hip complete x-ray on August 26, 2008, acute right      intratrochanteric hip fracture with element of impaction.  2. Chest x-ray on August 26, 2008, no congestive heart failure or      active disease, normal heart size.  3. CT head on August 26, 2008, remote large left middle cerebral artery      infarct with additional remote lacunar infarcts, no evidence of      intracranial hemorrhage, or acute abnormality.  4. Right hip operative x-ray on August 30, 2008, open reduction and      internal fixation of right intratrochanteric femur fracture.   LABORATORY DATA:  On day of discharge, INR 1.7.  CBC, BMET within normal  limits.  Digoxin level 1.1, vitamin D level 10.   HOSPITAL COURSE:  This is a 60 year old female with  a history of right  hemiparesis and expressive aphasia who presented with a right  intertrochanteric hip fracture and supratherapeutic INR.   PROBLEM:  1. Right hip fracture.  Upon presentation, right hip film showed an      intertrochanteric fracture with some element of impaction.  The      patient was seen by Orthopedics who recommended ORIF.  The patient      had a supratherapeutic INR which prevented immediate surgery.      After normalization of the INR, the patient was taken to the OR on      August 30, 2008, for ORIF with pinning of the right femur.  Postop      course was complicated by a decreasing hemoglobin, which was      managed with blood transfusion.  The patient was seen by PT/OT for      evaluation and treatment who recommended 24-hour supervision at      home with  home health.  2. Anemia.  On admission, the patient's hemoglobin was 12.1 with a      supratherapeutic INR.  After surgery, her hemoglobin trended down      to 8.8.  Due to the patient's systolic dysfunction with an      estimated ejection fraction of 10-20%, goal hemoglobin level was      determined to be greater than 10.  She was transfused 1 unit of      PRBCs followed by 10 mg of Lasix to prevent volume overload.      Following initial transfusion, hemoglobin improved to 9.9,      continued to trend up to 10.5 on day of discharge.  3. Supratherapeutic INR.  The patient was taking Coumadin 7.5 mg daily      for systolic heart failure.  On admission, her INR was 5.8.      Coumadin was held.  She was given a total of 7.5 mg of vitamin K      per pharmacy protocol to normalize her INR for surgery.  The INR      dropped to 1.1 on August 30, 2008, and the patient was taken to      surgery.  Following surgery, the patient was bridged with Lovenox      on a treatment dose for her cardiomyopathy 50 mg b.i.d. until her      INR was therapeutic.  The patient was restarted on 5 mg of Coumadin      on postop day 1.   INR trended back up to 1.7 on day of discharge.      Lovenox was discontinued on discharge.  4. Heart failure.  The patient has a history of ischemic      cardiomyopathy with an ejection fraction of 10-20%, for which she      takes Coumadin and digoxin throughout this admission.  The patient      remained asymptomatic.  5. Hypertension.  The patient developed hypotension after admission.      Her Isordil was discontinued and her Coreg 6.25 mg was held until      her pressures normalized.  The patient was then restarted on half      dose of Coreg at 3.125 mg.  Pressures remained controlled for the      remainder of the admission.  6. Vitamin D deficiency.  The patient was found to have a vitamin D      level of 10.  We recommend the patient that her vitamin D level be      checked as an outpatient and her vitamin D level will be repleted      as an outpatient.  7. Seizure disorder. The patient was maintained on home dose of Keppra      and remained seizure free throughout the duration of this      admission.   DISCHARGE INSTRUCTIONS:  The patient may resume a normal diet.  She  needs 24 hour supervision with daytime home health.  She will need  assistance with ambulation.  She will need to continue her rehab with  PT/OT on discharge.  She is touched down weightbearing on the right  side.  She may wash her wound with soap and warm water and pat dry.  Do  not immerse the wound in bath water for 1 week after discharge.  The  patient will return to clinic for INR check and management of Coumadin.   FOLLOWUP APPOINTMENTS:  On  September 06, 2008, at 1 p.m. [facility] with Dr. Hal Hope.  The patient will have INR  checked at this appointment.   DISCHARGE CONDITION/LOCATION:  The patient was stable on discharge.  She  was discharged to home.     Wayne A. Sheffield Slider, M.D.  Electronically Signed    WAH/MEDQ  D:  09/06/2008  T:  09/06/2008  Job:  [id number]   cc:   Dr.  Hal Hope

## 2010-10-16 LAB — DIFFERENTIAL
Basophils Relative: 0
Lymphs Abs: 1.1
Monocytes Absolute: 0.2
Monocytes Relative: 6
Neutro Abs: 2.5
Neutrophils Relative %: 65

## 2010-10-16 LAB — COMPREHENSIVE METABOLIC PANEL
Albumin: 3.6
Alkaline Phosphatase: 95
BUN: 14
Calcium: 8.8
Potassium: 4
Sodium: 138
Total Protein: 6.2

## 2010-10-16 LAB — CBC
HCT: 36.7
MCHC: 34.2
Platelets: 273
RDW: 16.1 — ABNORMAL HIGH

## 2010-10-27 LAB — BASIC METABOLIC PANEL
BUN: 19 mg/dL (ref 6–23)
BUN: 23 mg/dL (ref 6–23)
BUN: 28 mg/dL — ABNORMAL HIGH (ref 6–23)
BUN: 30 mg/dL — ABNORMAL HIGH (ref 6–23)
BUN: 15
CO2: 17 mEq/L — ABNORMAL LOW (ref 19–32)
CO2: 20 mEq/L (ref 19–32)
CO2: 24 mEq/L (ref 19–32)
CO2: 29 mEq/L (ref 19–32)
CO2: 32 mEq/L (ref 19–32)
CO2: 25
Calcium: 7.9 mg/dL — ABNORMAL LOW (ref 8.4–10.5)
Calcium: 8.1 mg/dL — ABNORMAL LOW (ref 8.4–10.5)
Calcium: 8.9 mg/dL (ref 8.4–10.5)
Calcium: 9.1 mg/dL (ref 8.4–10.5)
Calcium: 9.2 mg/dL (ref 8.4–10.5)
Calcium: 8.3 — ABNORMAL LOW
Chloride: 102 mEq/L (ref 96–112)
Chloride: 102 mEq/L (ref 96–112)
Chloride: 112 mEq/L (ref 96–112)
Chloride: 114 mEq/L — ABNORMAL HIGH (ref 96–112)
Chloride: 114 mEq/L — ABNORMAL HIGH (ref 96–112)
Chloride: 104
Creatinine, Ser: 0.59 mg/dL (ref 0.4–1.2)
Creatinine, Ser: 0.79 mg/dL (ref 0.4–1.2)
Creatinine, Ser: 0.85 mg/dL (ref 0.4–1.2)
Creatinine, Ser: 1.01 mg/dL (ref 0.4–1.2)
Creatinine, Ser: 1.09 mg/dL (ref 0.4–1.2)
Creatinine, Ser: 1.14 mg/dL (ref 0.4–1.2)
Creatinine, Ser: 0.51
GFR calc Af Amer: 59 mL/min — ABNORMAL LOW (ref >60)
GFR calc Af Amer: >60 mL/min (ref >60)
GFR calc Af Amer: >60 mL/min (ref >60)
GFR calc Af Amer: >60 mL/min (ref >60)
GFR calc Af Amer: >60 mL/min (ref >60)
GFR calc Af Amer: >60
GFR calc non Af Amer: >60 mL/min (ref >60)
GFR calc non Af Amer: >60 mL/min (ref >60)
GFR calc non Af Amer: >60 mL/min (ref >60)
GFR calc non Af Amer: >60 mL/min (ref >60)
GFR calc non Af Amer: >60 mL/min (ref >60)
GFR calc non Af Amer: >60
Glucose, Bld: 112 mg/dL — ABNORMAL HIGH (ref 70–99)
Glucose, Bld: 113 mg/dL — ABNORMAL HIGH (ref 70–99)
Glucose, Bld: 120 mg/dL — ABNORMAL HIGH (ref 70–99)
Glucose, Bld: 138 mg/dL — ABNORMAL HIGH (ref 70–99)
Glucose, Bld: 154 mg/dL — ABNORMAL HIGH (ref 70–99)
Glucose, Bld: 103 — ABNORMAL HIGH
Potassium: 3.6 mEq/L (ref 3.5–5.1)
Potassium: 3.7 mEq/L (ref 3.5–5.1)
Potassium: 4 mEq/L (ref 3.5–5.1)
Potassium: 4 mEq/L (ref 3.5–5.1)
Potassium: 3.4 — ABNORMAL LOW
Sodium: 137 mEq/L (ref 135–145)
Sodium: 142 mEq/L (ref 135–145)
Sodium: 143 mEq/L (ref 135–145)
Sodium: 139

## 2010-10-27 LAB — CBC
HCT: 37.9 % (ref 36.0–46.0)
HCT: 38.6 % (ref 36.0–46.0)
HCT: 40 % (ref 36.0–46.0)
HCT: 40.1 % (ref 36.0–46.0)
HCT: 40.8 % (ref 36.0–46.0)
HCT: 41.3 % (ref 36.0–46.0)
HCT: 44.6 % (ref 36.0–46.0)
HCT: 36.1
Hemoglobin: 12.8 g/dL (ref 12.0–15.0)
Hemoglobin: 13 g/dL (ref 12.0–15.0)
Hemoglobin: 13.3 g/dL (ref 12.0–15.0)
Hemoglobin: 13.6 g/dL (ref 12.0–15.0)
Hemoglobin: 13.9 g/dL (ref 12.0–15.0)
Hemoglobin: 14.7 g/dL (ref 12.0–15.0)
Hemoglobin: 12.5
MCHC: 31.7 g/dL (ref 30.0–36.0)
MCHC: 31.9 g/dL (ref 30.0–36.0)
MCHC: 32 g/dL (ref 30.0–36.0)
MCHC: 32.1 g/dL (ref 30.0–36.0)
MCHC: 32.3 g/dL (ref 30.0–36.0)
MCHC: 32.6 g/dL (ref 30.0–36.0)
MCHC: 32.8 g/dL (ref 30.0–36.0)
MCHC: 32.9 g/dL (ref 30.0–36.0)
MCHC: 33 g/dL (ref 30.0–36.0)
MCHC: 34.7
MCV: 91.4 fL (ref 78.0–100.0)
MCV: 91.5 fL (ref 78.0–100.0)
MCV: 92.4 fL (ref 78.0–100.0)
MCV: 93.4 fL (ref 78.0–100.0)
MCV: 93.4 fL (ref 78.0–100.0)
MCV: 93.5 fL (ref 78.0–100.0)
MCV: 93.7 fL (ref 78.0–100.0)
MCV: 96.9
Platelets: 193 10*3/uL (ref 150–400)
Platelets: 223 10*3/uL (ref 150–400)
Platelets: 228 10*3/uL (ref 150–400)
Platelets: 236 10*3/uL (ref 150–400)
Platelets: 154
RBC: 4.22 MIL/uL (ref 3.87–5.11)
RBC: 4.38 MIL/uL (ref 3.87–5.11)
RBC: 4.42 MIL/uL (ref 3.87–5.11)
RBC: 4.5 MIL/uL (ref 3.87–5.11)
RBC: 4.83 MIL/uL (ref 3.87–5.11)
RBC: 3.72 — ABNORMAL LOW
RDW: 14.3 % (ref 11.5–15.5)
RDW: 14.4 % (ref 11.5–15.5)
RDW: 14.6 % (ref 11.5–15.5)
RDW: 14.7 % (ref 11.5–15.5)
RDW: 14.9 % (ref 11.5–15.5)
RDW: 15.1 % (ref 11.5–15.5)
RDW: 15.1 % (ref 11.5–15.5)
RDW: 14.5
WBC: 11.1 10*3/uL — ABNORMAL HIGH (ref 4.0–10.5)
WBC: 7.4 10*3/uL (ref 4.0–10.5)
WBC: 8 10*3/uL (ref 4.0–10.5)
WBC: 9.4 10*3/uL (ref 4.0–10.5)
WBC: 6.1

## 2010-10-27 LAB — CARDIAC PANEL(CRET KIN+CKTOT+MB+TROPI)
CK, MB: 10.3 ng/mL — ABNORMAL HIGH (ref 0.3–4.0)
CK, MB: 6.7 ng/mL — ABNORMAL HIGH (ref 0.3–4.0)
Relative Index: 6.3 — ABNORMAL HIGH (ref 0.0–2.5)
Relative Index: INVALID (ref 0.0–2.5)
Total CK: 107 U/L (ref 7–177)
Troponin I: 1.02 ng/mL (ref 0.00–0.06)
Troponin I: 1.03 ng/mL (ref 0.00–0.06)
Troponin I: 1.2 ng/mL (ref 0.00–0.06)

## 2010-10-27 LAB — DIFFERENTIAL
Basophils Absolute: 0 10*3/uL (ref 0.0–0.1)
Basophils Absolute: 0
Basophils Relative: 1 % (ref 0–1)
Basophils Relative: 0
Eosinophils Absolute: 0 10*3/uL (ref 0.0–0.7)
Eosinophils Absolute: 0 — ABNORMAL LOW
Eosinophils Relative: 0
Lymphocytes Relative: 39
Lymphs Abs: 2.4
Monocytes Absolute: 0.3
Monocytes Relative: 4 % (ref 3–12)
Monocytes Relative: 4
Neutro Abs: 6 10*3/uL (ref 1.7–7.7)
Neutro Abs: 3.4
Neutrophils Relative %: 70 % (ref 43–77)
Neutrophils Relative %: 56

## 2010-10-27 LAB — COMPREHENSIVE METABOLIC PANEL
ALT: 20 U/L (ref 0–35)
ALT: 25 U/L (ref 0–35)
ALT: 26 U/L (ref 0–35)
AST: 37 U/L (ref 0–37)
AST: 41 U/L — ABNORMAL HIGH (ref 0–37)
Albumin: 3.1 g/dL — ABNORMAL LOW (ref 3.5–5.2)
Alkaline Phosphatase: 74 U/L (ref 39–117)
Alkaline Phosphatase: 82 U/L (ref 39–117)
Alkaline Phosphatase: 89 U/L (ref 39–117)
BUN: 12 mg/dL (ref 6–23)
BUN: 18 mg/dL (ref 6–23)
BUN: 19 mg/dL (ref 6–23)
CO2: 21 mEq/L (ref 19–32)
CO2: 22 mEq/L (ref 19–32)
CO2: 22 mEq/L (ref 19–32)
Calcium: 7.5 mg/dL — ABNORMAL LOW (ref 8.4–10.5)
Calcium: 8.1 mg/dL — ABNORMAL LOW (ref 8.4–10.5)
Chloride: 102 mEq/L (ref 96–112)
Chloride: 106 mEq/L (ref 96–112)
Chloride: 112 mEq/L (ref 96–112)
Chloride: 96 mEq/L (ref 96–112)
Creatinine, Ser: 0.78 mg/dL (ref 0.4–1.2)
Creatinine, Ser: 1.06 mg/dL (ref 0.4–1.2)
GFR calc Af Amer: >60 mL/min (ref >60)
GFR calc Af Amer: >60 mL/min (ref >60)
GFR calc non Af Amer: 53 mL/min — ABNORMAL LOW (ref >60)
GFR calc non Af Amer: >60 mL/min (ref >60)
Glucose, Bld: 105 mg/dL — ABNORMAL HIGH (ref 70–99)
Glucose, Bld: 127 mg/dL — ABNORMAL HIGH (ref 70–99)
Glucose, Bld: 164 mg/dL — ABNORMAL HIGH (ref 70–99)
Potassium: 2.9 mEq/L — ABNORMAL LOW (ref 3.5–5.1)
Potassium: 3.5 mEq/L (ref 3.5–5.1)
Potassium: 3.5 mEq/L (ref 3.5–5.1)
Sodium: 140 mEq/L (ref 135–145)
Sodium: 143 mEq/L (ref 135–145)
Sodium: 143 mEq/L (ref 135–145)
Total Bilirubin: 0.8 mg/dL (ref 0.3–1.2)
Total Bilirubin: 1.1 mg/dL (ref 0.3–1.2)
Total Bilirubin: 1.2 mg/dL (ref 0.3–1.2)
Total Protein: 5.5 g/dL — ABNORMAL LOW (ref 6.0–8.3)
Total Protein: 6.2 g/dL (ref 6.0–8.3)
Total Protein: 6.4 g/dL (ref 6.0–8.3)

## 2010-10-27 LAB — URINALYSIS, ROUTINE W REFLEX MICROSCOPIC
Glucose, UA: NEGATIVE mg/dL
Ketones, ur: 15 mg/dL — AB
Ketones, ur: 15 mg/dL — AB
Leukocytes, UA: NEGATIVE
Nitrite: NEGATIVE
Protein, ur: 100 mg/dL — AB
Protein, ur: >300 mg/dL — AB
pH: 6 (ref 5.0–8.0)

## 2010-10-27 LAB — GLUCOSE, CAPILLARY
Glucose-Capillary: 108 mg/dL — ABNORMAL HIGH (ref 70–99)
Glucose-Capillary: 130 mg/dL — ABNORMAL HIGH (ref 70–99)
Glucose-Capillary: 130 mg/dL — ABNORMAL HIGH (ref 70–99)
Glucose-Capillary: 138 mg/dL — ABNORMAL HIGH (ref 70–99)
Glucose-Capillary: 141 mg/dL — ABNORMAL HIGH (ref 70–99)
Glucose-Capillary: 143 mg/dL — ABNORMAL HIGH (ref 70–99)
Glucose-Capillary: 156 mg/dL — ABNORMAL HIGH (ref 70–99)
Glucose-Capillary: 156 mg/dL — ABNORMAL HIGH (ref 70–99)
Glucose-Capillary: 94 mg/dL (ref 70–99)

## 2010-10-27 LAB — HEPARIN LEVEL (UNFRACTIONATED)
Heparin Unfractionated: 0.19 IU/mL — ABNORMAL LOW (ref 0.30–0.70)
Heparin Unfractionated: 0.2 IU/mL — ABNORMAL LOW (ref 0.30–0.70)
Heparin Unfractionated: 0.28 IU/mL — ABNORMAL LOW (ref 0.30–0.70)
Heparin Unfractionated: 0.36 IU/mL (ref 0.30–0.70)
Heparin Unfractionated: 0.42 IU/mL (ref 0.30–0.70)
Heparin Unfractionated: 0.6 IU/mL (ref 0.30–0.70)
Heparin Unfractionated: 1.22 IU/mL — ABNORMAL HIGH (ref 0.30–0.70)
Heparin Unfractionated: <0.1 IU/mL — ABNORMAL LOW (ref 0.30–0.70)

## 2010-10-27 LAB — POCT I-STAT 3, ART BLOOD GAS (G3+)
Acid-Base Excess: 3 mmol/L — ABNORMAL HIGH (ref 0.0–2.0)
O2 Saturation: 100 %

## 2010-10-27 LAB — LIPID PANEL
Cholesterol: 178 mg/dL (ref 0–200)
LDL Cholesterol: 135 mg/dL — ABNORMAL HIGH (ref 0–99)

## 2010-10-27 LAB — MAGNESIUM
Magnesium: 2 mg/dL (ref 1.5–2.5)
Magnesium: 2.3 mg/dL (ref 1.5–2.5)
Magnesium: 2.7 mg/dL — ABNORMAL HIGH (ref 1.5–2.5)

## 2010-10-27 LAB — RAPID URINE DRUG SCREEN, HOSP PERFORMED
Amphetamines: NOT DETECTED
Barbiturates: NOT DETECTED
Benzodiazepines: NOT DETECTED
Opiates: NOT DETECTED

## 2010-10-27 LAB — HEMOGLOBIN A1C: Mean Plasma Glucose: 108 mg/dL

## 2010-10-27 LAB — URINE MICROSCOPIC-ADD ON

## 2010-10-27 LAB — B-NATRIURETIC PEPTIDE (CONVERTED LAB)
Pro B Natriuretic peptide (BNP): 150 pg/mL — ABNORMAL HIGH (ref 0.0–100.0)
Pro B Natriuretic peptide (BNP): 155 pg/mL — ABNORMAL HIGH (ref 0.0–100.0)
Pro B Natriuretic peptide (BNP): 162 pg/mL — ABNORMAL HIGH (ref 0.0–100.0)
Pro B Natriuretic peptide (BNP): 321 pg/mL — ABNORMAL HIGH (ref 0.0–100.0)
Pro B Natriuretic peptide (BNP): >3200 pg/mL — ABNORMAL HIGH (ref 0.0–100.0)
Pro B Natriuretic peptide (BNP): >3200 pg/mL — ABNORMAL HIGH (ref 0.0–100.0)

## 2010-10-27 LAB — APTT
aPTT: 21 seconds — ABNORMAL LOW (ref 24–37)
aPTT: 27 seconds (ref 24–37)

## 2010-10-27 LAB — PROTIME-INR
INR: 1.1 (ref 0.00–1.49)
INR: 1.1 (ref 0.00–1.49)
Prothrombin Time: 14.3 seconds (ref 11.6–15.2)

## 2010-10-27 LAB — CK TOTAL AND CKMB (NOT AT ARMC): CK, MB: 5.9 ng/mL — ABNORMAL HIGH (ref 0.3–4.0)

## 2011-03-12 ENCOUNTER — Other Ambulatory Visit: Payer: Self-pay | Admitting: Physician Assistant

## 2011-03-12 ENCOUNTER — Other Ambulatory Visit: Payer: Self-pay | Admitting: Family Medicine

## 2011-03-12 MED ORDER — LISINOPRIL 10 MG PO TABS: 10.0000 mg | ORAL_TABLET | Freq: Every day | ORAL | Status: DC

## 2011-03-28 ENCOUNTER — Other Ambulatory Visit: Payer: Self-pay | Admitting: Physician Assistant

## 2011-04-11 ENCOUNTER — Other Ambulatory Visit: Payer: Self-pay | Admitting: Physician Assistant

## 2011-04-28 ENCOUNTER — Other Ambulatory Visit: Payer: Self-pay | Admitting: Physician Assistant

## 2011-05-08 ENCOUNTER — Other Ambulatory Visit: Payer: Self-pay | Admitting: Internal Medicine

## 2011-05-11 ENCOUNTER — Other Ambulatory Visit: Payer: Self-pay | Admitting: Physician Assistant

## 2011-05-17 ENCOUNTER — Ambulatory Visit (INDEPENDENT_AMBULATORY_CARE_PROVIDER_SITE_OTHER): Payer: Medicare Other | Admitting: Family Medicine

## 2011-05-17 VITALS — BP 138/69 | HR 71 | Temp 97.4°F | Resp 16 | Ht 66.0 in | Wt 124.0 lb

## 2011-05-17 DIAGNOSIS — Z8679 Personal history of other diseases of the circulatory system: Secondary | ICD-10-CM

## 2011-05-17 DIAGNOSIS — K219 Gastro-esophageal reflux disease without esophagitis: Secondary | ICD-10-CM

## 2011-05-17 DIAGNOSIS — E785 Hyperlipidemia, unspecified: Secondary | ICD-10-CM

## 2011-05-17 DIAGNOSIS — L600 Ingrowing nail: Secondary | ICD-10-CM

## 2011-05-17 DIAGNOSIS — I639 Cerebral infarction, unspecified: Secondary | ICD-10-CM

## 2011-05-17 DIAGNOSIS — I10 Essential (primary) hypertension: Secondary | ICD-10-CM

## 2011-05-17 DIAGNOSIS — Z7901 Long term (current) use of anticoagulants: Secondary | ICD-10-CM

## 2011-05-17 DIAGNOSIS — I6350 Cerebral infarction due to unspecified occlusion or stenosis of unspecified cerebral artery: Secondary | ICD-10-CM

## 2011-05-17 LAB — COMPREHENSIVE METABOLIC PANEL
Albumin: 5 g/dL (ref 3.5–5.2)
Alkaline Phosphatase: 117 U/L (ref 39–117)
BUN: 15 mg/dL (ref 6–23)
CO2: 25 mEq/L (ref 19–32)
Glucose, Bld: 98 mg/dL (ref 70–99)
Total Bilirubin: 0.3 mg/dL (ref 0.3–1.2)
Total Protein: 7.9 g/dL (ref 6.0–8.3)

## 2011-05-17 LAB — TSH: TSH: 1.238 u[IU]/mL (ref 0.350–4.500)

## 2011-05-17 LAB — LIPID PANEL
LDL Cholesterol: 107 mg/dL — ABNORMAL HIGH (ref 0–99)
Total CHOL/HDL Ratio: 4.2 Ratio
VLDL: 38 mg/dL (ref 0–40)

## 2011-05-17 MED ORDER — CARVEDILOL 12.5 MG PO TABS: 12.5000 mg | ORAL_TABLET | Freq: Two times a day (BID) | ORAL | Status: DC

## 2011-05-17 MED ORDER — OMEPRAZOLE 20 MG PO CPDR: 20.0000 mg | DELAYED_RELEASE_CAPSULE | Freq: Every day | ORAL | Status: DC

## 2011-05-17 MED ORDER — LISINOPRIL 10 MG PO TABS: ORAL_TABLET | ORAL | Status: DC

## 2011-05-17 MED ORDER — DIGOXIN 250 MCG PO TABS: 250.0000 ug | ORAL_TABLET | Freq: Every day | ORAL | Status: DC

## 2011-05-17 MED ORDER — PRAVASTATIN SODIUM 80 MG PO TABS: 80.0000 mg | ORAL_TABLET | Freq: Every day | ORAL | Status: DC

## 2011-05-17 NOTE — Progress Notes (Signed)
  Subjective:    Patient ID: [name], female    DOB: 07-Feb-1950, 61 y.o.   MRN: [id number]  HPI Patient presents for medication refills.  ASCVD- S/P large (L) hemispheric stroke. Doing very well with speech therapy.   Speech much more intelligible. Gait more stable. Using 4 point cane without any falls. See's Wynne Dust who monitors PT/INR  Seizure disorder- followed by Neurology; currently seizure free on Keppra  HTN- needs refills of Lisinopril  Ingrown (R) toenail- more pain recently  (R) wrist pain- not wearing wrist splint. Actively participating in stroke rehab.   Review of Systems     Objective:   Physical Exam  Constitutional: She appears well-developed.  Neck: Neck supple. No thyromegaly present.  Cardiovascular: Normal rate, regular rhythm and normal heart sounds.   Pulmonary/Chest: Effort normal and breath sounds normal.  Neurological: She is alert.       (R) sided hemiplegia with increase tone  Skin:       Ingrown ((R) toenail; lateral edge          Assessment & Plan:   1. HTN (hypertension)  lisinopril (PRINIVIL,ZESTRIL) 10 MG tablet, carvedilol (COREG) 12.5 MG tablet, Comprehensive metabolic panel, TSH  2. Stroke, large (L) hemispheric    3. Ingrown toenail without infection  Follow up at 104 for wedge resection of ingrown nail  4. Dyslipidemia  pravastatin (PRAVACHOL) 80 MG tablet, Lipid panel  5. GERD (gastroesophageal reflux disease)  omeprazole (PRILOSEC) 20 MG capsule  6. History of CHF (congestive heart failure)  digoxin (LANOXIN) 0.25 MG tablet, Digoxin level, Ambulatory referral to Cardiology  7. Chronic anticoagulation      Follow up in 2 weeks for CPE

## 2011-05-18 LAB — DIGOXIN LEVEL: Digoxin Level: 1.2 ng/mL (ref 0.8–2.0)

## 2011-05-28 ENCOUNTER — Emergency Department (HOSPITAL_COMMUNITY): Payer: Medicare Other

## 2011-05-28 ENCOUNTER — Inpatient Hospital Stay (HOSPITAL_COMMUNITY)
Admission: EM | Admit: 2011-05-28 | Discharge: 2011-05-30 | DRG: 556 | Disposition: A | Discharge status: Home / Self Care | Payer: Medicare Other | Source: Ambulatory Visit | Attending: Family Medicine | Admitting: Family Medicine

## 2011-05-28 ENCOUNTER — Encounter (HOSPITAL_COMMUNITY): Payer: Self-pay | Admitting: *Deleted

## 2011-05-28 ENCOUNTER — Ambulatory Visit (INDEPENDENT_AMBULATORY_CARE_PROVIDER_SITE_OTHER): Payer: Medicare Other | Admitting: Family Medicine

## 2011-05-28 VITALS — BP 125/65 | HR 64 | Temp 97.9°F | Resp 16

## 2011-05-28 DIAGNOSIS — I10 Essential (primary) hypertension: Secondary | ICD-10-CM | POA: Diagnosis present

## 2011-05-28 DIAGNOSIS — Z87891 Personal history of nicotine dependence: Secondary | ICD-10-CM

## 2011-05-28 DIAGNOSIS — I509 Heart failure, unspecified: Secondary | ICD-10-CM | POA: Diagnosis present

## 2011-05-28 DIAGNOSIS — Z8673 Personal history of transient ischemic attack (TIA), and cerebral infarction without residual deficits: Secondary | ICD-10-CM

## 2011-05-28 DIAGNOSIS — I69920 Aphasia following unspecified cerebrovascular disease: Secondary | ICD-10-CM

## 2011-05-28 DIAGNOSIS — Z79899 Other long term (current) drug therapy: Secondary | ICD-10-CM

## 2011-05-28 DIAGNOSIS — G40909 Epilepsy, unspecified, not intractable, without status epilepticus: Secondary | ICD-10-CM | POA: Diagnosis present

## 2011-05-28 DIAGNOSIS — I6350 Cerebral infarction due to unspecified occlusion or stenosis of unspecified cerebral artery: Secondary | ICD-10-CM

## 2011-05-28 DIAGNOSIS — E785 Hyperlipidemia, unspecified: Secondary | ICD-10-CM | POA: Diagnosis present

## 2011-05-28 DIAGNOSIS — I6359 Cerebral infarction due to unspecified occlusion or stenosis of other cerebral artery: Secondary | ICD-10-CM

## 2011-05-28 DIAGNOSIS — I6329 Cerebral infarction due to unspecified occlusion or stenosis of other precerebral arteries: Secondary | ICD-10-CM

## 2011-05-28 DIAGNOSIS — R531 Weakness: Secondary | ICD-10-CM | POA: Diagnosis present

## 2011-05-28 DIAGNOSIS — R5383 Other fatigue: Secondary | ICD-10-CM

## 2011-05-28 DIAGNOSIS — I639 Cerebral infarction, unspecified: Secondary | ICD-10-CM

## 2011-05-28 DIAGNOSIS — R269 Unspecified abnormalities of gait and mobility: Secondary | ICD-10-CM

## 2011-05-28 DIAGNOSIS — R29898 Other symptoms and signs involving the musculoskeletal system: Principal | ICD-10-CM | POA: Diagnosis present

## 2011-05-28 DIAGNOSIS — R5381 Other malaise: Secondary | ICD-10-CM

## 2011-05-28 DIAGNOSIS — I5022 Chronic systolic (congestive) heart failure: Secondary | ICD-10-CM | POA: Diagnosis present

## 2011-05-28 DIAGNOSIS — I69959 Hemiplegia and hemiparesis following unspecified cerebrovascular disease affecting unspecified side: Secondary | ICD-10-CM

## 2011-05-28 DIAGNOSIS — Z7901 Long term (current) use of anticoagulants: Secondary | ICD-10-CM

## 2011-05-28 HISTORY — DX: Cerebral infarction, unspecified: I63.9

## 2011-05-28 HISTORY — DX: Essential (primary) hypertension: I10

## 2011-05-28 LAB — BASIC METABOLIC PANEL
Calcium: 9.9 mg/dL (ref 8.4–10.5)
GFR calc Af Amer: 82 mL/min — ABNORMAL LOW (ref >90)
GFR calc non Af Amer: 71 mL/min — ABNORMAL LOW (ref >90)
Potassium: 4.5 mEq/L (ref 3.5–5.1)
Sodium: 138 mEq/L (ref 135–145)

## 2011-05-28 LAB — URINALYSIS, ROUTINE W REFLEX MICROSCOPIC
Bilirubin Urine: NEGATIVE
Hgb urine dipstick: NEGATIVE
Nitrite: NEGATIVE
Protein, ur: NEGATIVE mg/dL
Specific Gravity, Urine: 1.019 (ref 1.005–1.030)
Urobilinogen, UA: 0.2 mg/dL (ref 0.0–1.0)

## 2011-05-28 LAB — PROTIME-INR: INR: 2.24 — ABNORMAL HIGH (ref 0.00–1.49)

## 2011-05-28 LAB — CBC
MCH: 28.1 pg (ref 26.0–34.0)
MCHC: 33.7 g/dL (ref 30.0–36.0)
RDW: 13.7 % (ref 11.5–15.5)

## 2011-05-28 NOTE — ED Notes (Signed)
Pt is in "good spirits" and has not been feeling unwell despite this generalized weakness which has been increasing since Friday.  Pt is alert and oriented

## 2011-05-28 NOTE — Progress Notes (Signed)
Date: 05/28/2011  Rate: 65  Rhythm: normal sinus rhythm  QRS Axis: normal  Intervals: normal  ST/T Wave abnormalities: normal  Conduction Disutrbances:right bundle branch block  Narrative Interpretation: Abnormal EKG  Old EKG Reviewed: unchanged

## 2011-05-28 NOTE — Progress Notes (Signed)
Patient Name: [name] Date of Birth: 1950-02-17 Medical Record Number: [id number] Gender: female Date of Encounter: 05/28/2011  History of Present Illness:  [name] is a 61 y.o. very pleasant female patient who presents with the following:  History of stroke- large left hemispheric in 2009.  However, she had been doing well with her rehabilitation and was able to walk with a cane fairly well until the past Friday (today is Monday).  On Friday she went to use the restroom but was feeling weak and had difficulty walking.  Felt better Saturday- then yesterday could not stand up and walk at all.  Her speech has changed according to her husband.  Her right side is normally weak but she feels that it is more weak now.  Her husband is afraid that he may have mixed up her medications- she has not had her keppra in the last few days it seems.  No seizures that they have noted.  No GI symptoms. No dysuria.  She also notes a right sided HA especially when she" tries to get up," ok when she is lying down.    There is no problem list on file for this patient.  No past medical history on file. No past surgical history on file. History  Substance Use Topics  . Smoking status: Former Games developer  . Smokeless tobacco: Not on file  . Alcohol Use: Not on file   No family history on file. No Known Allergies  Medication list has been reviewed and updated.  Review of Systems: As per HPI- otherwise negative.   Physical Examination: Filed Vitals:   05/28/11 1531  BP: 125/65  Pulse: 64  Temp: 97.9 F (36.6 C)  TempSrc: Oral  Resp: 16    There is no height or weight on file to calculate BMI.  GEN: WDWN, NAD, Non-toxic, A & O x 3- looks comfortable   HEENT: Atraumatic, Normocephalic. Neck supple. No masses, No LAD.  TM and oropharynx wnl.  PEERL, EOMI Ears and Nose: No external deformity. CV: RRR, No M/G/R. No JVD. No thrill. No extra heart sounds. PULM: CTA B, no wheezes, crackles,  rhonchi. No retractions. No resp. distress. No accessory muscle use. PSYCH: Normally interactive. Conversant. Not depressed or anxious appearing.  Calm demeanor.  Neuro: I cannot understand the majority of the patient's speech, but she seems to be answering questions appropriately according to her husband who is able to understand her.  She is sometimes slow to follow commands.  Normal strength and sensation left arm and leg, reduced DTR left biceps and patellar tendons.  Normal sensation right arm and leg. No movement of right arm, contracture right wrist.  Minimal movement right leg- strength 1/5.  Increased reflexes on right.   Left sided facial drooping.   [name] is able to get on her feet with two people to support her, but she is not able to walk- according to her husband this is not her baseline  Assessment and Plan: 1. CVA (cerebral infarction)   2. Weakness    [name] has a history of a major CVA and residual right sided weakness.  However, her symptoms are currently worse than her baseline.  She has also been off her Keppra for a few days.  She reports that she had her coumadin level checked quite recently and that it was normal.  I did call and touch base with the physician on call for [name] (she is a patient of Dr. Vickey Huger).  He  advised that [name] go to the ED for evaluation.  [name] and her husband agreed with this plan and will proceed to the ED.

## 2011-05-28 NOTE — ED Provider Notes (Signed)
History     CSN: [id number]  Arrival date & time 05/28/11  1635   First MD Initiated Contact with Patient 05/28/11 1717      Chief Complaint  Patient presents with  . Weakness    (Consider location/radiation/quality/duration/timing/severity/associated sxs/prior treatment) HPI  PCP Dr. Arbutus Leas- Family Practice Medicine Neurologist: Dr. Glenice Laine  Patient presents to the ED with complaints of right side weakness for 3 days. Pt had major stroke which has left her with significant right sided weakness of her arm and leg in which she has been mobile with a cane. Now, over the past 3 days, her weakness has been much more significant. She has been unable to walk with her cane or move her leg or arm. She denies feeling any weakness on her left side. The patient lifts up her left leg and kicks it around while telling me this. She admits to having a headache over the past couple of days. Her husband admits to maybe "messing up on her medications". The patients speech is very difficult to understand. The husband tells me that this speech is her baseline.  Past Medical History  Diagnosis Date  . CVA (cerebral infarction)   . Hypertension     Past Surgical History  Procedure Date  . Hip surgery     No family history on file.  History  Substance Use Topics  . Smoking status: Former Games developer  . Smokeless tobacco: Not on file  . Alcohol Use: No    OB History    Grav Para Term Preterm Abortions TAB SAB Ect Mult Living                  Review of Systems   HEENT: denies blurry vision or change in hearing PULMONARY: Denies difficulty breathing and SOB CARDIAC: denies chest pain or heart palpitations MUSCULOSKELETAL:  denies being unable to ambulate ABDOMEN AL: denies abdominal pain GU: denies loss of bowel or urinary control NEURO: denies numbness and tingling in extremities. Admits to weakness is right side. Denies weakness in the left side   Allergies  Review of patient's  allergies indicates no known allergies.  Home Medications   Current Outpatient Rx  Name Route Sig Dispense Refill  . CARVEDILOL 12.5 MG PO TABS Oral Take 6.25 mg by mouth 2 (two) times daily with a meal.    . DIGOXIN 0.25 MG PO TABS Oral Take 250 mcg by mouth daily. Need office visit for additional refills.    Marland Kitchen LEVETIRACETAM 500 MG PO TABS Oral Take 500 mg by mouth 2 (two) times daily.    Marland Kitchen LISINOPRIL 10 MG PO TABS Oral Take 10 mg by mouth daily.    Marland Kitchen PRAVASTATIN SODIUM 80 MG PO TABS Oral Take 80 mg by mouth daily.    . WARFARIN SODIUM 5 MG PO TABS Oral Take 5 mg by mouth daily.      BP 118/61  Pulse 64  Temp(Src) 98.1 F (36.7 C) (Oral)  Resp 20  SpO2 99%  Physical Exam  Nursing note and vitals reviewed. Constitutional: She is oriented to person, place, and time. She appears well-developed and well-nourished. No distress.  HENT:  Head: Normocephalic and atraumatic.  Eyes: Pupils are equal, round, and reactive to light.  Neck: Normal range of motion. Neck supple.  Cardiovascular: Normal rate and regular rhythm.   Pulmonary/Chest: Effort normal.  Abdominal: Soft.  Neurological: She is oriented to person, place, and time. A cranial nerve deficit is present.  Pt unable to move right leg and right arm. Pt has right side facial weakness.  Skin: Skin is warm and dry.    ED Course  Procedures (including critical care time)  Labs Reviewed  CBC - Abnormal; Notable for the following:    WBC 11.6 (*)    All other components within normal limits  BASIC METABOLIC PANEL - Abnormal; Notable for the following:    GFR calc non Af Amer 71 (*)    GFR calc Af Amer 82 (*)    All other components within normal limits  URINALYSIS, ROUTINE W REFLEX MICROSCOPIC - Abnormal; Notable for the following:    Leukocytes, UA SMALL (*)    All other components within normal limits  PROTIME-INR - Abnormal; Notable for the following:    Prothrombin Time 25.2 (*)    INR 2.24 (*)    All other  components within normal limits  URINE MICROSCOPIC-ADD ON   Ct Head Wo Contrast  05/28/2011  *RADIOLOGY REPORT*  Clinical Data: 61 year old female recently unable to walk.  Right side pain.  History of 2009 stroke.  CT HEAD WITHOUT CONTRAST  Technique:  Contiguous axial images were obtained from the base of the skull through the vertex without contrast.  Comparison: 08/26/2008 and earlier.  Findings: Visualized paranasal sinuses and mastoids are clear. Visualized orbits and scalp soft tissues are within normal limits. No acute osseous abnormality identified.  Calcified atherosclerosis at the skull base.  Chronic encephalomalacia in the left MCA territory with ex vacuo changes to the ventricles.  Chronic right anterior external capsule lacunar infarct has not significantly changed.  Hypodensity in the ventral left thalamus does appear increased, but chronic in nature. Sequelae of layering degeneration at the mid brain.  No midline shift, mass effect, or evidence of mass lesion.  No acute intracranial hemorrhage identified.  No evidence of cortically based acute infarction identified.  No suspicious intracranial vascular hyperdensity.  IMPRESSION: Chronic ischemic disease with some progression in the left hemisphere since 2010, but no acute intracranial abnormality.  Original Report Authenticated By: Harley Hallmark, M.D.   Mr Brain Wo Contrast  05/28/2011  *RADIOLOGY REPORT*  Clinical Data: 61 year old female with recent difficulties with speech.  History of left brain stroke in 2009.  MRI HEAD WITHOUT CONTRAST  Technique:  Multiplanar, multiecho pulse sequences of the brain and surrounding structures were obtained according to standard protocol without intravenous contrast.  Comparison: Head CT from the same day and earlier.  Findings: Widespread left MCA territory encephalomalacia. Involvement of the left deep gray matter nuclei including the left thalamus.  Diffusion imaging is without convincing area of  restricted diffusion to suggest acute infarct.  Ex vacuo changes to the ventricles. No midline shift, mass effect, or evidence of mass lesion.  Small chronic micro hemorrhages in the brainstem. No acute intracranial hemorrhage identified.  Negative pituitary, cervicomedullary junction and visualized cervical spine. Major intracranial vascular flow voids are preserved.  Chronic white matter infarcts in the right corona radiata extending toward the external capsule have not significantly changed. Wallerian degeneration in the brainstem and interval increased pontine white matter T2 and FLAIR hyperintensity.  Mildly increased cerebellar gray matter T2 and FLAIR hyperintensity.  Rightward gaze deviation. Visualized orbit soft tissues are within normal limits.  Visualized bone marrow signal is within normal limits.  Visualized paranasal sinuses and mastoids are clear. Negative scalp soft tissues.  IMPRESSION: 1.  No acute intracranial abnormality. 2.  Extensive left MCA and deep gray matter nuclei chronic  ischemia. Comparatively mild brainstem and cerebellar white matter signal abnormality, likely due to small vessel disease.  Original Report Authenticated By: Harley Hallmark, M.D.     1. Right leg weakness       MDM  pts labs and images have come back without any new changes and her PT/INR is therapeutic. Dr. Ignacia Palma has seen patient and is now going to consult with neurology regarding the patient.  I have consulted Family Practice who has agreed to admit patient.  Patient status has remained stable while in the ED.      Dorthula Matas, PA 05/28/11 2210

## 2011-05-28 NOTE — ED Notes (Signed)
Pt has been getting progressively weaker since Friday (was able to ambulate with cane until then).  She is now unable to ambulate at all per her husband.  Per her husband she has been having pain in her right legs and left since Friday also.  Pt and husband deny any worsening in speech, LOC or any new focal weakness, the weakness is generalized increasing weakness since Friday.  Pt has had several previous stokes with right sided deficit and some speech limitations from this.

## 2011-05-28 NOTE — ED Notes (Signed)
The pt has  Been unable to walk since Friday intermittently.  She had  A stroke 2009 buthas been able to walk with a walker or cane.  She has been having some pain on her rt side.  She is unable to talk very well since the stroke.  The husband is her caretaker and he says the pt has not fallen

## 2011-05-28 NOTE — ED Notes (Signed)
To ct

## 2011-05-28 NOTE — ED Notes (Signed)
The pt remains in mri 

## 2011-05-28 NOTE — ED Notes (Signed)
The pt cannot cough on command her brain does not process the information.  She failed the swallow screen.  Past stroke

## 2011-05-28 NOTE — ED Notes (Signed)
Patient unable to urinate on bedpan. Will I&O cath when patient returns from CT.

## 2011-05-28 NOTE — H&P (Signed)
[name] is an 61 y.o. female.   Chief Complaint: Worsening right LE weakness HPI: 61 yo F with h/o large left hemispheric stroke in 2009 with residual speech and motor deficits presents with worsening R sided weakness.  Normally, at her baseline, patient is able to ambulate with a cane and is actively participating in stroke rehab.  For the past 3 days, she has been unable to ambulate due to increasing weakness on her right side, specifically her leg.  No seizures. No fevers or chills. No nausea, vomiting, diarrhea. No vision changes.  No cough, SOB, chest pain, URI symptoms.  No changes in her speech or facial movements.  No difficulties swallowing at home.  Mood and mentation are at her baseline.  No new falls.  No confusion or mental status changes.  Pt's husband does not she has been using increased weight at PT and wonders if this may have contributed to her weakness.  Patient complaints of some pain in her right leg, most significant in her right foot.  Denies joint pain or swelling, no redness or edema, no warmth.    While in the ED, pt had head CT and MRI done which were negative for new stroke.  Neuro was called and wanted medicine to admit pt.  Husband and pt report mild spontaneous improvement since patient is now able to lift right leg against gravity and some resistance, which she was unable to do earlier today.    Past Medical History  Diagnosis Date  . CVA (cerebral infarction)   . Hypertension     Past Surgical History  Procedure Date  . Hip surgery     No family history on file. Social History:  reports that she has quit smoking. She does not have any smokeless tobacco history on file. She reports that she does not drink alcohol. Her drug history not on file.  Allergies: No Known Allergies   (Not in a hospital admission)  Results for orders placed during the hospital encounter of 05/28/11 (from the past 48 hour(s))  CBC     Status: Abnormal   Collection Time   05/28/11  4:48 PM      Component Value Range Comment   WBC 11.6 (*) 4.0 - 10.5 (K/uL)    RBC 4.99  3.87 - 5.11 (MIL/uL)    Hemoglobin 14.0  12.0 - 15.0 (g/dL)    HCT 16.1  09.6 - 04.5 (%)    MCV 83.4  78.0 - 100.0 (fL)    MCH 28.1  26.0 - 34.0 (pg)    MCHC 33.7  30.0 - 36.0 (g/dL)    RDW 40.9  81.1 - 91.4 (%)    Platelets 326  150 - 400 (K/uL)   BASIC METABOLIC PANEL     Status: Abnormal   Collection Time   05/28/11  4:48 PM      Component Value Range Comment   Sodium 138  135 - 145 (mEq/L)    Potassium 4.5  3.5 - 5.1 (mEq/L)    Chloride 101  96 - 112 (mEq/L)    CO2 26  19 - 32 (mEq/L)    Glucose, Bld 96  70 - 99 (mg/dL)    BUN 15  6 - 23 (mg/dL)    Creatinine, Ser 7.82  0.50 - 1.10 (mg/dL)    Calcium 9.9  8.4 - 10.5 (mg/dL)    GFR calc non Af Amer 71 (*) >90 (mL/min)    GFR calc Af Amer 82 (*) >  90 (mL/min)   PROTIME-INR     Status: Abnormal   Collection Time   05/28/11  5:45 PM      Component Value Range Comment   Prothrombin Time 25.2 (*) 11.6 - 15.2 (seconds)    INR 2.24 (*) 0.00 - 1.49    URINALYSIS, ROUTINE W REFLEX MICROSCOPIC     Status: Abnormal   Collection Time   05/28/11  6:45 PM      Component Value Range Comment   Color, Urine YELLOW  YELLOW     APPearance CLEAR  CLEAR     Specific Gravity, Urine 1.019  1.005 - 1.030     pH 6.0  5.0 - 8.0     Glucose, UA NEGATIVE  NEGATIVE (mg/dL)    Hgb urine dipstick NEGATIVE  NEGATIVE     Bilirubin Urine NEGATIVE  NEGATIVE     Ketones, ur NEGATIVE  NEGATIVE (mg/dL)    Protein, ur NEGATIVE  NEGATIVE (mg/dL)    Urobilinogen, UA 0.2  0.0 - 1.0 (mg/dL)    Nitrite NEGATIVE  NEGATIVE     Leukocytes, UA SMALL (*) NEGATIVE    URINE MICROSCOPIC-ADD ON     Status: Normal   Collection Time   05/28/11  6:45 PM      Component Value Range Comment   Squamous Epithelial / LPF RARE  RARE     WBC, UA 0-2  <3 (WBC/hpf)    Ct Head Wo Contrast  05/28/2011  *RADIOLOGY REPORT*  Clinical Data: 61 year old female recently unable to walk.  Right  side pain.  History of 2009 stroke.  CT HEAD WITHOUT CONTRAST  Technique:  Contiguous axial images were obtained from the base of the skull through the vertex without contrast.  Comparison: 08/26/2008 and earlier.  Findings: Visualized paranasal sinuses and mastoids are clear. Visualized orbits and scalp soft tissues are within normal limits. No acute osseous abnormality identified.  Calcified atherosclerosis at the skull base.  Chronic encephalomalacia in the left MCA territory with ex vacuo changes to the ventricles.  Chronic right anterior external capsule lacunar infarct has not significantly changed.  Hypodensity in the ventral left thalamus does appear increased, but chronic in nature. Sequelae of layering degeneration at the mid brain.  No midline shift, mass effect, or evidence of mass lesion.  No acute intracranial hemorrhage identified.  No evidence of cortically based acute infarction identified.  No suspicious intracranial vascular hyperdensity.  IMPRESSION: Chronic ischemic disease with some progression in the left hemisphere since 2010, but no acute intracranial abnormality.  Original Report Authenticated By: Harley Hallmark, M.D.   Mr Brain Wo Contrast  05/28/2011  *RADIOLOGY REPORT*  Clinical Data: 61 year old female with recent difficulties with speech.  History of left brain stroke in 2009.  MRI HEAD WITHOUT CONTRAST  Technique:  Multiplanar, multiecho pulse sequences of the brain and surrounding structures were obtained according to standard protocol without intravenous contrast.  Comparison: Head CT from the same day and earlier.  Findings: Widespread left MCA territory encephalomalacia. Involvement of the left deep gray matter nuclei including the left thalamus.  Diffusion imaging is without convincing area of restricted diffusion to suggest acute infarct.  Ex vacuo changes to the ventricles. No midline shift, mass effect, or evidence of mass lesion.  Small chronic micro hemorrhages in the  brainstem. No acute intracranial hemorrhage identified.  Negative pituitary, cervicomedullary junction and visualized cervical spine. Major intracranial vascular flow voids are preserved.  Chronic white matter infarcts in the right corona radiata extending  toward the external capsule have not significantly changed. Wallerian degeneration in the brainstem and interval increased pontine white matter T2 and FLAIR hyperintensity.  Mildly increased cerebellar gray matter T2 and FLAIR hyperintensity.  Rightward gaze deviation. Visualized orbit soft tissues are within normal limits.  Visualized bone marrow signal is within normal limits.  Visualized paranasal sinuses and mastoids are clear. Negative scalp soft tissues.  IMPRESSION: 1.  No acute intracranial abnormality. 2.  Extensive left MCA and deep gray matter nuclei chronic ischemia. Comparatively mild brainstem and cerebellar white matter signal abnormality, likely due to small vessel disease.  Original Report Authenticated By: Harley Hallmark, M.D.    Review of Systems  Constitutional: Negative for fever, chills, weight loss and malaise/fatigue.  HENT: Negative for congestion and sore throat.   Eyes: Negative for blurred vision, double vision, discharge and redness.  Respiratory: Negative for cough, shortness of breath and wheezing.   Cardiovascular: Negative for chest pain, palpitations, orthopnea and leg swelling.  Gastrointestinal: Negative for nausea, vomiting, abdominal pain, diarrhea, constipation and blood in stool.  Genitourinary: Negative for dysuria.  Musculoskeletal: Negative for joint pain and falls.  Skin: Negative for rash.  Neurological: Positive for focal weakness, weakness and headaches. Negative for dizziness, sensory change, speech change and seizures.    Blood pressure 118/61, pulse 64, temperature 98.1 F (36.7 C), temperature source Oral, resp. rate 20, SpO2 99.00%. Physical Exam  Constitutional: She appears well-developed and  well-nourished. No distress.  HENT:  Head: Normocephalic and atraumatic.  Right Ear: External ear normal.  Left Ear: External ear normal.  Nose: Nose normal.  Mouth/Throat: Oropharynx is clear and moist. No oropharyngeal exudate.  Eyes: Conjunctivae and EOM are normal. Pupils are equal, round, and reactive to light.  Neck: Normal range of motion. Neck supple.  Cardiovascular: Normal rate, regular rhythm and normal heart sounds.   No murmur heard. Respiratory: Effort normal and breath sounds normal. No respiratory distress. She has no wheezes. She has no rales.  GI: Soft. Bowel sounds are normal. She exhibits no distension. There is no tenderness.  Musculoskeletal:       Decreased ROM RUE (baseline)  Lymphadenopathy:    She has no cervical adenopathy.  Neurological: She is alert. A cranial nerve deficit is present. Coordination abnormal.       4/5 strength left upper and lower ext; 2/5 strength RLE, 0/5 RUE; right sided facial droop  Skin: Skin is warm and dry.     Assessment/Plan 61 yo female with h/o large left sided CVA in 2009 and resdidual weakness p/w worsening R sided weakness  1. Weakness: workup per [facility]; pt is Dr. Oliva Bustard patient -CT and MRI negative for acute stroke -appears to be slowly resolving spontaneously according to pt and husband -will have PT/OT evaluate in AM for possible new patient need -ST to evaluate as well  2. HTN: currently normotensive -continue home lisinopril 10mg , coreg 6.25mg  BID  3. H/o CVA: -continue coumadin, dosing per pharmacy; INR currently 2.24 -continue keppra for seizure ppx  4. H/o CHF: -continue home digoxin 0.25mg   -last dig level, 05/17/11, was 1.2  5. HLD: -continue home statin  6. FEN/GI: NPO until passes swallow evaluation, NS @ 75cc/hr  7. Ppx: coumadin per pharm, prilosec (home med)  8. Dispo: pending Neuro recs, PT/OT eval and rec's    MCGILL,JACQUELYN 05/28/2011, 10:13 PM     I have seen and  examined this patient. I have discussed with Dr Fara Boros.  I agree with their findings  and plans as documented in their admission note.

## 2011-05-28 NOTE — Consult Note (Signed)
[facility] CONSULT NOTE     Reason for Consult: Acute loss of ability to stand and walk.    HPI:    [name] is an 61 y.o. female with a history of left cerebral infarction with severe right hemiparesis and expressive aphasia, hypertension, hyperlipidemia, seizure disorder and right and left ventricular dysfunction, presenting with new onset of inability to stand and walk. Patient has severe expressive aphasia as well as non-complete paralysis of right upper extremity. She had residual spasticity of the right lower extremity but was able to ambulate independently until about 2-3 days ago. She is unable to stand without support at this point and unable to walk including with assistance. Repeat CT scan of her head today showed no acute abnormality. Repeat MRI of her brain showed no signs of acute stroke. She's been on anticoagulation with Coumadin presumably for right and left ventricular severe dysfunction. INR today was 2.4. No seizure activity has been reported. Her husband is not sure she's been taking her medications properly however and doesn't think that she is taking the Keppra otherwise couple days.  Past Medical History  Diagnosis Date  . CVA (cerebral infarction)   . Hypertension     Past Surgical History  Procedure Date  . Hip surgery     No family history on file.  Social History:  reports that she has quit smoking. She does not have any smokeless tobacco history on file. She reports that she does not drink alcohol. Her drug history not on file.  No Known Allergies  Medications:    Medications: Prior to Admission  Coreg 12.5 mg twice a day Digoxin 0.25 mg per day Keppra 500 mg twice a day Fosinopril 10 mg per day Pravachol 80 mg per day Coumadin 5 mg per day  Blood pressure 118/61, pulse 64, temperature 98.1 F (36.7 C), temperature source Oral, resp. rate 20, SpO2 99.00%.   Neurologic Examination:  Mental Status: Alert  and in no distress.  Severe expressive aphasia with essentially unintelligible speech except for single word responses. Able to follow commands without difficulty. Cranial Nerves: II-Visual fields were normal with finger counting bilaterally. III/IV/VI-Pupils were equal and reacted. Extraocular movements were full and conjugate.    V/VII-no facial numbness; moderately severe right lower facial weakness. VIII-normal. X-unintelligible except for single word responses. Motor: 5/5 strength and normal tone of left upper and lower extremities; complete paralysis of right upper extremity; moderate weakness proximally of right lower extremity as well as severe weakness distally; marked spasticity of right lower extremity Sensory: Reduced perception of tactile sensation over right extremities compared to the left. Deep Tendon Reflexes: Asymmetric with greater responses elicited from right extremities compared to her left. Plantars: Right Babinski sign. Cerebellar: Normal finger-to-nose testing with left upper extremity. Carotid auscultation: Normal   Ct Head Wo Contrast  05/28/2011  *RADIOLOGY REPORT*  Clinical Data: 61 year old female recently unable to walk.  Right side pain.  History of 2009 stroke.  CT HEAD WITHOUT CONTRAST  Technique:  Contiguous axial images were obtained from the base of the skull through the vertex without contrast.  Comparison: 08/26/2008 and earlier.  Findings: Visualized paranasal sinuses and mastoids are clear. Visualized orbits and scalp soft tissues are within normal limits. No acute osseous abnormality identified.  Calcified atherosclerosis at the skull base.  Chronic encephalomalacia in the left MCA territory with ex vacuo  changes to the ventricles.  Chronic right anterior external capsule lacunar infarct has not significantly changed.  Hypodensity in the ventral left thalamus does appear increased, but chronic in nature. Sequelae of layering degeneration at the mid brain.  No  midline shift, mass effect, or evidence of mass lesion.  No acute intracranial hemorrhage identified.  No evidence of cortically based acute infarction identified.  No suspicious intracranial vascular hyperdensity.  IMPRESSION: Chronic ischemic disease with some progression in the left hemisphere since 2010, but no acute intracranial abnormality.  Original Report Authenticated By: Harley Hallmark, M.D.   Mr Brain Wo Contrast  05/28/2011  *RADIOLOGY REPORT*  Clinical Data: 61 year old female with recent difficulties with speech.  History of left brain stroke in 2009.  MRI HEAD WITHOUT CONTRAST  Technique:  Multiplanar, multiecho pulse sequences of the brain and surrounding structures were obtained according to standard protocol without intravenous contrast.  Comparison: Head CT from the same day and earlier.  Findings: Widespread left MCA territory encephalomalacia. Involvement of the left deep gray matter nuclei including the left thalamus.  Diffusion imaging is without convincing area of restricted diffusion to suggest acute infarct.  Ex vacuo changes to the ventricles. No midline shift, mass effect, or evidence of mass lesion.  Small chronic micro hemorrhages in the brainstem. No acute intracranial hemorrhage identified.  Negative pituitary, cervicomedullary junction and visualized cervical spine. Major intracranial vascular flow voids are preserved.  Chronic white matter infarcts in the right corona radiata extending toward the external capsule have not significantly changed. Wallerian degeneration in the brainstem and interval increased pontine white matter T2 and FLAIR hyperintensity.  Mildly increased cerebellar gray matter T2 and FLAIR hyperintensity.  Rightward gaze deviation. Visualized orbit soft tissues are within normal limits.  Visualized bone marrow signal is within normal limits.  Visualized paranasal sinuses and mastoids are clear. Negative scalp soft tissues.  IMPRESSION: 1.  No acute intracranial  abnormality. 2.  Extensive left MCA and deep gray matter nuclei chronic ischemia. Comparatively mild brainstem and cerebellar white matter signal abnormality, likely due to small vessel disease.  Original Report Authenticated By: Harley Hallmark, M.D.    Assessment/Plan:  1. Etiology for acute exacerbation of dysfunction of right lower extremity is unclear. Patient has no evidence clinically nor on MRI study of acute stroke. There is no indication of acute neuropathy involving right lower extremity. Conceivable that if she has missed anticonvulsant medication that she could have had focal seizure activity and subsequent Todd's paralysis involving right lower extremity. 2. Status post left middle cerebral artery territory stroke with residual severe expressive aphasia as well as severe right hemiparesis. 3. Chronic seizure disorder. 4. Right and left severe ventricular dysfunction on anticoagulation with Coumadin, with therapeutic INR.  Recommendations:  1. Anticonvulsant management with Keppra 500 mg twice a day. 2. No further neurodiagnostic studies are indicated at this point. 3. Physical therapy evaluation and recommendations regarding lower extremity function and ambulation. 4. No change in anticoagulation management.  Venetia Maxon M.D. [facility] [phone]  05/28/2011, 10:35 PM

## 2011-05-29 DIAGNOSIS — I6990 Unspecified sequelae of unspecified cerebrovascular disease: Secondary | ICD-10-CM

## 2011-05-29 DIAGNOSIS — R5383 Other fatigue: Secondary | ICD-10-CM

## 2011-05-29 DIAGNOSIS — R471 Dysarthria and anarthria: Secondary | ICD-10-CM

## 2011-05-29 DIAGNOSIS — I6359 Cerebral infarction due to unspecified occlusion or stenosis of other cerebral artery: Secondary | ICD-10-CM

## 2011-05-29 DIAGNOSIS — R5381 Other malaise: Secondary | ICD-10-CM

## 2011-05-29 DIAGNOSIS — R269 Unspecified abnormalities of gait and mobility: Secondary | ICD-10-CM

## 2011-05-29 DIAGNOSIS — I6329 Cerebral infarction due to unspecified occlusion or stenosis of other precerebral arteries: Secondary | ICD-10-CM

## 2011-05-29 LAB — CBC
HCT: 37.9 % (ref 36.0–46.0)
Hemoglobin: 12.6 g/dL (ref 12.0–15.0)
MCH: 27.6 pg (ref 26.0–34.0)
MCHC: 33.2 g/dL (ref 30.0–36.0)
MCV: 82.9 fL (ref 78.0–100.0)

## 2011-05-29 LAB — BASIC METABOLIC PANEL
BUN: 16 mg/dL (ref 6–23)
Chloride: 104 mEq/L (ref 96–112)
Glucose, Bld: 95 mg/dL (ref 70–99)
Potassium: 4.1 mEq/L (ref 3.5–5.1)

## 2011-05-29 MED ORDER — CARVEDILOL 6.25 MG PO TABS
6.2500 mg | ORAL_TABLET | Freq: Two times a day (BID) | ORAL | Status: DC
  Administered 2011-05-30: 6.25 mg via ORAL
  Filled 2011-05-29 (×5): qty 1

## 2011-05-29 MED ORDER — SODIUM CHLORIDE 0.9 % IV SOLN
INTRAVENOUS | Status: DC
  Administered 2011-05-29: 75 mL/h via INTRAVENOUS
  Administered 2011-05-29 – 2011-05-30 (×2): via INTRAVENOUS

## 2011-05-29 MED ORDER — ACETAMINOPHEN 650 MG RE SUPP: 650.0000 mg | Freq: Four times a day (QID) | RECTAL | Status: DC | PRN

## 2011-05-29 MED ORDER — WARFARIN SODIUM 5 MG PO TABS
5.0000 mg | ORAL_TABLET | Freq: Every day | ORAL | Status: AC
  Administered 2011-05-29: 5 mg via ORAL
  Filled 2011-05-29: qty 1

## 2011-05-29 MED ORDER — WHITE PETROLATUM GEL
Status: AC
  Administered 2011-05-29: 01:00:00
  Filled 2011-05-29: qty 5

## 2011-05-29 MED ORDER — SIMVASTATIN 40 MG PO TABS
40.0000 mg | ORAL_TABLET | Freq: Every day | ORAL | Status: DC
  Administered 2011-05-29: 40 mg via ORAL
  Filled 2011-05-29 (×2): qty 1

## 2011-05-29 MED ORDER — LEVETIRACETAM 500 MG PO TABS
500.0000 mg | ORAL_TABLET | Freq: Two times a day (BID) | ORAL | Status: DC
  Administered 2011-05-29 – 2011-05-30 (×2): 500 mg via ORAL
  Filled 2011-05-29 (×5): qty 1

## 2011-05-29 MED ORDER — ACETAMINOPHEN 325 MG PO TABS: 650.0000 mg | ORAL_TABLET | Freq: Four times a day (QID) | ORAL | Status: DC | PRN

## 2011-05-29 MED ORDER — BACITRACIN-NEOMYCIN-POLYMYXIN 400-5-5000 EX OINT
TOPICAL_OINTMENT | Freq: Three times a day (TID) | CUTANEOUS | Status: DC
  Filled 2011-05-29 (×3): qty 1

## 2011-05-29 MED ORDER — SODIUM CHLORIDE 0.9 % IV SOLN: 1000.0000 mg | Freq: Once | INTRAVENOUS | Status: DC

## 2011-05-29 MED ORDER — LISINOPRIL 10 MG PO TABS
10.0000 mg | ORAL_TABLET | Freq: Every day | ORAL | Status: DC
  Administered 2011-05-30: 10 mg via ORAL
  Filled 2011-05-29 (×2): qty 1

## 2011-05-29 MED ORDER — BACITRACIN-NEOMYCIN-POLYMYXIN OINTMENT TUBE
TOPICAL_OINTMENT | Freq: Three times a day (TID) | CUTANEOUS | Status: DC
  Administered 2011-05-29: 22:00:00 via TOPICAL
  Administered 2011-05-29: 1 via TOPICAL
  Administered 2011-05-30: 12:00:00 via TOPICAL
  Filled 2011-05-29: qty 15

## 2011-05-29 MED ORDER — DIGOXIN 250 MCG PO TABS
250.0000 ug | ORAL_TABLET | Freq: Every day | ORAL | Status: DC
  Filled 2011-05-29 (×2): qty 1

## 2011-05-29 MED ORDER — SODIUM CHLORIDE 0.9 % IV SOLN
1000.0000 mg | Freq: Once | INTRAVENOUS | Status: AC
  Administered 2011-05-29: 1000 mg via INTRAVENOUS
  Filled 2011-05-29: qty 10

## 2011-05-29 MED ORDER — WARFARIN - PHARMACIST DOSING INPATIENT: Freq: Every day | Status: DC

## 2011-05-29 NOTE — Progress Notes (Signed)
I have seen and examined this patient. I have discussed with Dr Gwenlyn Saran.  I agree with their findings and plans as documented in their progress note for today. See my H&P for today for details.

## 2011-05-29 NOTE — Evaluation (Signed)
Clinical/Bedside Swallow Evaluation Patient Details  Name: [name] MRN: [id number] Date of Birth: Aug 12, 1950  Today's Date: 05/29/2011 Time: 1115-1130 SLP Time Calculation (min): 15 min  Past Medical History:  Past Medical History  Diagnosis Date  . CVA (cerebral infarction)   . Hypertension    Past Surgical History:  Past Surgical History  Procedure Date  . Hip surgery    HPI:  61 y.o. female with a history of left cerebral infarction with severe right hemiparesis and expressive aphasia, hypertension, hyperlipidemia, seizure disorder and right and left ventricular dysfunction, presenting with new onset of inability to stand and walk. Swallow evaluation ordered due to not passing the RN stroke swallow screen due to inability to cough on command.  Husband denied any h/o dysphagia.   Assessment / Plan / Recommendation Clinical Impression  Demonstrates an overall functional oral-pharyngeal swallow without any overt s/s of aspiration or a dysphagia.    Aspiration Risk  None    Diet Recommendation Regular;Thin liquid   Other  Recommendations     Follow Up Recommendations  None    Frequency and Duration        Pertinent Vitals/Pain n/a    Swallow Study Prior Functional Status  Type of Home: House Lives With: Spouse Available Help at Discharge: Family;Available 24 hours/day Vocation: On disability    General     Oral/Motor/Sensory Function Overall Oral Motor/Sensory Function: Appears within functional limits for tasks assessed   Ice Chips Ice chips: Not tested   Thin Liquid Thin Liquid: Within functional limits Presentation: Self Fed;Cup;Straw    Nectar Thick Nectar Thick Liquid: Not tested   Honey Thick Honey Thick Liquid: Not tested   Puree Puree: Within functional limits Presentation: Self Fed   Solid Solid: Within functional limits Presentation: Self Dewaine Oats, M.S.,CCC-SLP Pager [phone] 05/29/2011,12:21 PM

## 2011-05-29 NOTE — Progress Notes (Signed)
OT Cancellation Note  Treatment cancelled today due to Pt. is sleeping soundly.  Chart reviewed, read PT note, husband is reporting pt is back to baseline, however, PT gone for the day, so unable to confirm if pt. at baseline from an ADL standpoint, and husband not present.  Will follow up tomorrow to determine appropriateness of initiating OT eval..  [name] [phone] 05/29/2011, 4:16 PM

## 2011-05-29 NOTE — ED Provider Notes (Signed)
Medical screening examination/treatment/procedure(s) were conducted as a shared visit with non-physician practitioner(s) and myself.  I personally evaluated the patient during the encounter Pt is a 61 year old woman who had a catastrophic stroke 3 years ago, with persistent dysphasia, R arm spastic paralysis, and was able to stand and walk.  For 3 days she has been unable to walk.  Exam shows her to be unable to move the right leg.  She is dysphasic.  Her right arm is held rigidly against her chest.  CT of head and MRI of brain showed no new stroke.  Called Dr. Noel Christmas for neurologic consultation and Dr. Hayden Rasmussen to admit pt.  Has new onset of right leg weakness of uncertain etiology.  Valerie Cooper III, MD 05/29/11 1056

## 2011-05-29 NOTE — Progress Notes (Signed)
[facility] PROGRESS NOTE    SUBJECTIVE  No seizures reported.  Patient failed RN swallowing screen.  STx pending.  Told her husband fed her anyway last night without difficulty.  She indicates she is hungry.  Neuro consult reviewed.  MRI reported as no new stroke.  Indicates no low back pain/ache.   OBJECTIVE   Vital signs in last 24 hours: Temp:  [97.9 F (36.6 C)-98.8 F (37.1 C)] 98.8 F (37.1 C) (05/07 0554) Pulse Rate:  [60-76] 60  (05/07 0751) Resp:  [16-20] 18  (05/07 0554) BP: (93-139)/(52-82) 110/52 mmHg (05/07 0751) SpO2:  [94 %-99 %] 95 % (05/07 0554) Weight:  [56 kg (123 lb 7.3 oz)] 56 kg (123 lb 7.3 oz) (05/07 0037)  Intake/Output from previous day:   Intake/Output this shift:   Nutritional status: NPO  Past Medical History  Diagnosis Date  . CVA (cerebral infarction)   . Hypertension   Dyslipidemia  Neurologic Exam:  Awake, alert, only few sensible words.  Can mimic some instructions.  Does seem to understand some conversation.  No facial asymmetry.  No movement for me in RUE.  RUE in spastic flexion.  Maybe 1-2/5 right hip flexion.  She lifted the right leg up with her left arm but it may have stayed up some on its own.  Power grossly 5/5 LUE and LLE.  Feels pinch (grimace) RLE.  Hyperreflexic both biceps and knees.  Lab Results:  4/25 LFT's normal.  4/25 TSH normal.  4/25 FLP: LDL 107.  Results for orders placed during the hospital encounter of 05/28/11 (from the past 24 hour(s))  CBC     Status: Abnormal   Collection Time   05/28/11  4:48 PM      Component Value Range   WBC 11.6 (*) 4.0 - 10.5 (K/uL)   RBC 4.99  3.87 - 5.11 (MIL/uL)   Hemoglobin 14.0  12.0 - 15.0 (g/dL)   HCT 08.6  57.8 - 46.9 (%)   MCV 83.4  78.0 - 100.0 (fL)   MCH 28.1  26.0 - 34.0 (pg)   MCHC 33.7  30.0 - 36.0 (g/dL)   RDW 62.9  52.8 - 41.3 (%)   Platelets 326  150 - 400 (K/uL)  BASIC METABOLIC PANEL     Status: Abnormal   Collection Time   05/28/11  4:48 PM      Component Value Range   Sodium 138  135 - 145 (mEq/L)   Potassium 4.5  3.5 - 5.1 (mEq/L)   Chloride 101  96 - 112 (mEq/L)   CO2 26  19 - 32 (mEq/L)   Glucose, Bld 96  70 - 99 (mg/dL)   BUN 15  6 - 23 (mg/dL)   Creatinine, Ser 2.44  0.50 - 1.10 (mg/dL)   Calcium 9.9  8.4 - 01.0 (mg/dL)   GFR calc non Af Amer 71 (*) >90 (mL/min)   GFR calc Af Amer 82 (*) >90 (mL/min)  PROTIME-INR     Status: Abnormal   Collection Time   05/28/11  5:45 PM      Component Value Range   Prothrombin Time 25.2 (*) 11.6 - 15.2 (seconds)   INR 2.24 (*) 0.00 - 1.49   URINALYSIS, ROUTINE W REFLEX MICROSCOPIC     Status:  Abnormal   Collection Time   05/28/11  6:45 PM      Component Value Range   Color, Urine YELLOW  YELLOW    APPearance CLEAR  CLEAR    Specific Gravity, Urine 1.019  1.005 - 1.030    pH 6.0  5.0 - 8.0    Glucose, UA NEGATIVE  NEGATIVE (mg/dL)   Hgb urine dipstick NEGATIVE  NEGATIVE    Bilirubin Urine NEGATIVE  NEGATIVE    Ketones, ur NEGATIVE  NEGATIVE (mg/dL)   Protein, ur NEGATIVE  NEGATIVE (mg/dL)   Urobilinogen, UA 0.2  0.0 - 1.0 (mg/dL)   Nitrite NEGATIVE  NEGATIVE    Leukocytes, UA SMALL (*) NEGATIVE   URINE MICROSCOPIC-ADD ON     Status: Normal   Collection Time   05/28/11  6:45 PM      Component Value Range   Squamous Epithelial / LPF RARE  RARE    WBC, UA 0-2  <3 (WBC/hpf)  BASIC METABOLIC PANEL     Status: Abnormal   Collection Time   05/29/11  5:00 AM      Component Value Range   Sodium 139  135 - 145 (mEq/L)   Potassium 4.1  3.5 - 5.1 (mEq/L)   Chloride 104  96 - 112 (mEq/L)   CO2 22  19 - 32 (mEq/L)   Glucose, Bld 95  70 - 99 (mg/dL)   BUN 16  6 - 23 (mg/dL)   Creatinine, Ser 1.61  0.50 - 1.10 (mg/dL)   Calcium 9.2  8.4 - 09.6 (mg/dL)   GFR calc non Af Amer 77 (*) >90 (mL/min)   GFR calc Af Amer 90 (*) >90 (mL/min)  CBC     Status: Abnormal   Collection Time   05/29/11  5:00 AM      Component Value Range   WBC 10.6 (*) 4.0 - 10.5  (K/uL)   RBC 4.57  3.87 - 5.11 (MIL/uL)   Hemoglobin 12.6  12.0 - 15.0 (g/dL)   HCT 04.5  40.9 - 81.1 (%)   MCV 82.9  78.0 - 100.0 (fL)   MCH 27.6  26.0 - 34.0 (pg)   MCHC 33.2  30.0 - 36.0 (g/dL)   RDW 91.4  78.2 - 95.6 (%)   Platelets 300  150 - 400 (K/uL)  PROTIME-INR     Status: Abnormal   Collection Time   05/29/11  5:00 AM      Component Value Range   Prothrombin Time 22.8 (*) 11.6 - 15.2 (seconds)   INR 1.97 (*) 0.00 - 1.49    Studies/Results: Ct Head Wo Contrast  05/28/2011  *RADIOLOGY REPORT*  Clinical Data: 61 year old female recently unable to walk.  Right side pain.  History of 2009 stroke.  CT HEAD WITHOUT CONTRAST  Technique:  Contiguous axial images were obtained from the base of the skull through the vertex without contrast.  Comparison: 08/26/2008 and earlier.  Findings: Visualized paranasal sinuses and mastoids are clear. Visualized orbits and scalp soft tissues are within normal limits. No acute osseous abnormality identified.  Calcified atherosclerosis at the skull base.  Chronic encephalomalacia in the left MCA territory with ex vacuo changes to the ventricles.  Chronic right anterior external capsule lacunar infarct has not significantly changed.  Hypodensity in the ventral left thalamus does appear increased, but chronic in nature. Sequelae of layering degeneration at the mid brain.  No midline shift, mass effect, or evidence of mass lesion.  No acute intracranial hemorrhage identified.  No  evidence of cortically based acute infarction identified.  No suspicious intracranial vascular hyperdensity.  IMPRESSION: Chronic ischemic disease with some progression in the left hemisphere since 2010, but no acute intracranial abnormality.  Original Report Authenticated By: Harley Hallmark, M.D.   Mr Brain Wo Contrast  05/28/2011  *RADIOLOGY REPORT*  Clinical Data: 61 year old female with recent difficulties with speech.  History of left brain stroke in 2009.  MRI HEAD WITHOUT CONTRAST   Technique:  Multiplanar, multiecho pulse sequences of the brain and surrounding structures were obtained according to standard protocol without intravenous contrast.  Comparison: Head CT from the same day and earlier.  Findings: Widespread left MCA territory encephalomalacia. Involvement of the left deep gray matter nuclei including the left thalamus.  Diffusion imaging is without convincing area of restricted diffusion to suggest acute infarct.  Ex vacuo changes to the ventricles. No midline shift, mass effect, or evidence of mass lesion.  Small chronic micro hemorrhages in the brainstem. No acute intracranial hemorrhage identified.  Negative pituitary, cervicomedullary junction and visualized cervical spine. Major intracranial vascular flow voids are preserved.  Chronic white matter infarcts in the right corona radiata extending toward the external capsule have not significantly changed. Wallerian degeneration in the brainstem and interval increased pontine white matter T2 and FLAIR hyperintensity.  Mildly increased cerebellar gray matter T2 and FLAIR hyperintensity.  Rightward gaze deviation. Visualized orbit soft tissues are within normal limits.  Visualized bone marrow signal is within normal limits.  Visualized paranasal sinuses and mastoids are clear. Negative scalp soft tissues.  IMPRESSION: 1.  No acute intracranial abnormality. 2.  Extensive left MCA and deep gray matter nuclei chronic ischemia. Comparatively mild brainstem and cerebellar white matter signal abnormality, likely due to small vessel disease.  Original Report Authenticated By: Harley Hallmark, M.D.    Medications:     Prior to Admission:  Prescriptions prior to admission  Medication Sig Dispense Refill  . carvedilol (COREG) 12.5 MG tablet Take 6.25 mg by mouth 2 (two) times daily with a meal.      . digoxin (LANOXIN) 0.25 MG tablet Take 250 mcg by mouth daily. Need office visit for additional refills.      . levETIRAcetam (KEPPRA)  500 MG tablet Take 500 mg by mouth 2 (two) times daily.      Marland Kitchen lisinopril (PRINIVIL,ZESTRIL) 10 MG tablet Take 10 mg by mouth daily.      . pravastatin (PRAVACHOL) 80 MG tablet Take 80 mg by mouth daily.      Marland Kitchen warfarin (COUMADIN) 5 MG tablet Take 5 mg by mouth daily.       Scheduled:   . carvedilol  6.25 mg Oral BID WC  . digoxin  250 mcg Oral Daily  . levETIRAcetam  500 mg Oral BID  . lisinopril  10 mg Oral Daily  . simvastatin  40 mg Oral q1800  . warfarin  5 mg Oral q1800  . Warfarin - Pharmacist Dosing Inpatient   Does not apply q1800  . white petrolatum        Assessment/Plan:   Assessment:  1.  Old left hemisphere stroke with right hemiparesis with decline in function of RLE of known etiology.  Dr. Roseanne Reno postulated a Todd's paresis phenomenon as her husband said she may not have been taking her Keppra.  No new stroke per MRI.  Does not seem to have pain in the leg.  Does not look toxic. 2.  Presumed focal seizure disorder with secondary generaliation but seizure type  not defined so far, said to be on 1000 mg/d Keppra but may have missed doses.  Recommendations: 1.  F/U pending PTx and STx 2.  Trial of Keppra IV load to see if it makes any difference. 3.   Digoxin level  Hayden Rasmussen, MD [facility] [phone]  05/29/2011, 7:55 AM

## 2011-05-29 NOTE — Progress Notes (Signed)
PHARMACY - ANTICOAGULATION CONSULT Initial Consult Note  Pharmacy Consult for: Coumadin  Indication: H/o CVA    Patient Data:   Allergies: No Known Allergies  Patient Measurements: Height: 5\' 7"  (170.2 cm) Weight: 123 lb 7.3 oz (56 kg) IBW/kg (Calculated) : 61.6    Vital Signs: Temp:  [97.9 F (36.6 C)-98.2 F (36.8 C)] 98.2 F (36.8 C) (05/07 0037) Pulse Rate:  [64-72] 67  (05/07 0037) Resp:  [16-20] 18  (05/07 0037) BP: (118-139)/(61-82) 139/75 mmHg (05/07 0037) SpO2:  [94 %-99 %] 94 % (05/07 0037) Weight:  [123 lb 7.3 oz (56 kg)] 123 lb 7.3 oz (56 kg) (05/07 0037)  Intake/Output from previous day: No intake or output data in the 24 hours ending 05/29/11 0057  Labs:  Basename 05/28/11 1745 05/28/11 1648  HGB -- 14.0  HCT -- 41.6  PLT -- 326  APTT -- --  LABPROT 25.2* --  INR 2.24* --  HEPARINUNFRC -- --  CREATININE -- 0.86  CKTOTAL -- --  CKMB -- --  TROPONINI -- --   Estimated Creatinine Clearance: 60 ml/min (by C-G formula based on Cr of 0.86).  Medical History: Past Medical History  Diagnosis Date  . CVA (cerebral infarction)   . Hypertension     Scheduled medications:     . carvedilol  6.25 mg Oral BID WC  . digoxin  250 mcg Oral Daily  . levETIRAcetam  500 mg Oral BID  . lisinopril  10 mg Oral Daily  . simvastatin  40 mg Oral q1800     Assessment:  61 y.o. female admitted on 05/28/2011, with h/o CVA. Pharmacy consulted to manage Coumadin. INR (2.24) is at-goal.   Goal of Therapy:  1. INR 2-3  Plan:  1. Continue home Coumadin regimen of 5mg  daily. 2. Daily PT / INR.   Dineen Kid Thad Ranger, PharmD 05/29/2011, 12:57 AM

## 2011-05-29 NOTE — Evaluation (Signed)
Physical Therapy Evaluation Patient Details Name: [name] MRN: [id number] DOB: 02/26/1950 Today's Date: 05/29/2011 Time: 9629-5284 PT Time Calculation (min): 30 min  PT Assessment / Plan / Recommendation Clinical Impression  pt presents with concern for increased R sided weakness.  pt with hisptory of CVA causing R sided weakness and per husband appears to be near baseline.  pt with expressive aphasia, but yes/no appropriate.  Husband confirms that pt has 24hr care at home and will continue this after D/C.  Had attempted eval earlier this am however pt frustrated that this PT not understanding her when pt would attempt to speak long sentences to this PT.  RN made aware.  Returned later in am so husband able to A.      PT Assessment  Patent does not need any further PT services    Follow Up Recommendations  No PT follow up    Equipment Recommendations  None recommended by PT    Frequency      Precautions / Restrictions Precautions Precautions: Fall Required Braces or Orthoses: Other Brace/Splint Other Brace/Splint: pt has R AFO.   Restrictions Weight Bearing Restrictions: No   Pertinent Vitals/Pain       Mobility  Bed Mobility Bed Mobility: Supine to Sit;Sitting - Scoot to Edge of Bed Supine to Sit: 6: Modified independent (Device/Increase time) Sitting - Scoot to Edge of Bed: 6: Modified independent (Device/Increase time) Details for Bed Mobility Assistance: pt requires increased time and encouragement to do without PT A as husband notes at home she normally does this on her own.   Transfers Transfers: Sit to Stand;Stand to Sit Sit to Stand: 6: Modified independent (Device/Increase time);With upper extremity assist;From bed Stand to Sit: 6: Modified independent (Device/Increase time);With upper extremity assist;To bed Details for Transfer Assistance: Needs increased time and leans L.  Per husband this is baseline.   Ambulation/Gait Ambulation/Gait Assistance: 5:  Supervision Ambulation Distance (Feet): 180 Feet Assistive device: Large base quad cane Ambulation/Gait Assistance Details: pt demos good use of QC and awareness of obstacles.  pt with one LOB initially during ambulating, however was able to self-correct.   Gait Pattern: Step-to pattern Stairs: No Wheelchair Mobility Wheelchair Mobility: No    Exercises     PT Goals    Visit Information  Last PT Received On: 05/29/11 Assistance Needed: +1    Subjective Data  Subjective: She is normally pretty Independent.   Patient Stated Goal: Per Husband to return home at [facility].     Prior Functioning  Home Living Lives With: Spouse Available Help at Discharge: Family;Available 24 hours/day Type of Home: House Home Access: Stairs to enter [employer] of Steps: 1 Entrance Stairs-Rails: None Home Layout: One level Home Adaptive Equipment: Quad cane Prior Function Level of Independence: Needs assistance Needs Assistance: Dressing;Bathing;Light Housekeeping Bath: Minimal Dressing: Minimal Light Housekeeping: Moderate Able to Take Stairs?: Yes Driving: No Vocation: On disability Comments: pt only needs A with bra and bathing L UE.   Communication Communication: Expressive difficulties    Cognition       Extremity/Trunk Assessment Right Lower Extremity Assessment RLE ROM/Strength/Tone: Deficits RLE ROM/Strength/Tone Deficits: pt with history CVA.  Hip grossly 3+/5, knee 2/5, ankle 1/5.   RLE Sensation: WFL - Light Touch Left Lower Extremity Assessment LLE ROM/Strength/Tone: Within functional levels LLE Sensation: WFL - Light Touch   Balance Balance Balance Assessed: Yes Dynamic Standing Balance Dynamic Standing - Comments: pt needed QC to maintain balance during activities, however this is baseline per husband.  pt seems to have good awareness of safety.    End of Session PT - End of Session Equipment Utilized During Treatment: Gait belt;Right ankle foot  orthosis Activity Tolerance: Patient tolerated treatment well Patient left: in bed;with family/visitor present (Sitting EOB with SLP.  ) Nurse Communication: Mobility status   Magin Balbi, Alison Murray, PT [phone] 05/29/2011, 2:10 PM

## 2011-05-29 NOTE — H&P (Signed)
I have seen and examined this patient. I have discussed with Dr McGill.  I agree with their findings and plans as documented in their admission note.  

## 2011-05-29 NOTE — Progress Notes (Signed)
Family Medicine Teaching Service Daily Progress Note: [id number] Subjective: No acute events overnight. Difficult to communicate with patient.  Objective: Vital signs in last 24 hours: Temp:  [97.9 F (36.6 C)-98.8 F (37.1 C)] 98.8 F (37.1 C) (05/07 0554) Pulse Rate:  [60-76] 60  (05/07 0751) Resp:  [16-20] 18  (05/07 0554) BP: (93-139)/(52-82) 110/52 mmHg (05/07 0751) SpO2:  [94 %-99 %] 95 % (05/07 0554) Weight:  [123 lb 7.3 oz (56 kg)] 123 lb 7.3 oz (56 kg) (05/07 0037) Weight change:  Last BM Date:  (PTA)  Intake/Output from previous day:   Intake/Output this shift:    General appearance: alert, cooperative, I was unable to understand her responses to person, time and place.  Resp: clear to auscultation bilaterally Cardio: S1S2, RRR, 2/6 systolic murmur best heard at left upper sternal border GI: soft, non-tender; bowel sounds normal; no masses,  no organomegaly Neurologic: CN: minimal facial droop on right side, otherwise CN2-12 grossly intact; 4+/5 strength in left leg, 3/5 strength in right leg, decreased sensation in right leg compared to left. Contracture in right arm. 5/5 grip in left hand.  Speech: dysphasia Lab Results:  Basename 05/29/11 0500 05/28/11 1648  WBC 10.6* 11.6*  HGB 12.6 14.0  HCT 37.9 41.6  PLT 300 326   BMET  Basename 05/29/11 0500 05/28/11 1648  NA 139 138  K 4.1 4.5  CL 104 101  CO2 22 26  GLUCOSE 95 96  BUN 16 15  CREATININE 0.80 0.86  CALCIUM 9.2 9.9    Studies/Results: Ct Head Wo Contrast  05/28/2011  *RADIOLOGY REPORT*  Clinical Data: 61 year old female recently unable to walk.  Right side pain.  History of 2009 stroke.  CT HEAD WITHOUT CONTRAST  Technique:  Contiguous axial images were obtained from the base of the skull through the vertex without contrast.  Comparison: 08/26/2008 and earlier.  Findings: Visualized paranasal sinuses and mastoids are clear. Visualized orbits and scalp soft tissues are within normal limits. No acute  osseous abnormality identified.  Calcified atherosclerosis at the skull base.  Chronic encephalomalacia in the left MCA territory with ex vacuo changes to the ventricles.  Chronic right anterior external capsule lacunar infarct has not significantly changed.  Hypodensity in the ventral left thalamus does appear increased, but chronic in nature. Sequelae of layering degeneration at the mid brain.  No midline shift, mass effect, or evidence of mass lesion.  No acute intracranial hemorrhage identified.  No evidence of cortically based acute infarction identified.  No suspicious intracranial vascular hyperdensity.  IMPRESSION: Chronic ischemic disease with some progression in the left hemisphere since 2010, but no acute intracranial abnormality.  Original Report Authenticated By: Harley Hallmark, M.D.   Mr Brain Wo Contrast  05/28/2011  *RADIOLOGY REPORT*  Clinical Data: 61 year old female with recent difficulties with speech.  History of left brain stroke in 2009.  MRI HEAD WITHOUT CONTRAST  Technique:  Multiplanar, multiecho pulse sequences of the brain and surrounding structures were obtained according to standard protocol without intravenous contrast.  Comparison: Head CT from the same day and earlier.  Findings: Widespread left MCA territory encephalomalacia. Involvement of the left deep gray matter nuclei including the left thalamus.  Diffusion imaging is without convincing area of restricted diffusion to suggest acute infarct.  Ex vacuo changes to the ventricles. No midline shift, mass effect, or evidence of mass lesion.  Small chronic micro hemorrhages in the brainstem. No acute intracranial hemorrhage identified.  Negative pituitary, cervicomedullary junction and visualized cervical  spine. Major intracranial vascular flow voids are preserved.  Chronic white matter infarcts in the right corona radiata extending toward the external capsule have not significantly changed. Wallerian degeneration in the brainstem  and interval increased pontine white matter T2 and FLAIR hyperintensity.  Mildly increased cerebellar gray matter T2 and FLAIR hyperintensity.  Rightward gaze deviation. Visualized orbit soft tissues are within normal limits.  Visualized bone marrow signal is within normal limits.  Visualized paranasal sinuses and mastoids are clear. Negative scalp soft tissues.  IMPRESSION: 1.  No acute intracranial abnormality. 2.  Extensive left MCA and deep gray matter nuclei chronic ischemia. Comparatively mild brainstem and cerebellar white matter signal abnormality, likely due to small vessel disease.  Original Report Authenticated By: Harley Hallmark, M.D.    Medications:  Scheduled:   . carvedilol  6.25 mg Oral BID WC  . digoxin  250 mcg Oral Daily  . levetiracetam  1,000 mg Intravenous Once  . levETIRAcetam  500 mg Oral BID  . lisinopril  10 mg Oral Daily  . neomycin-bacitracin-polymyxin   Topical TID  . simvastatin  40 mg Oral q1800  . warfarin  5 mg Oral q1800  . Warfarin - Pharmacist Dosing Inpatient   Does not apply q1800  . white petrolatum      . DISCONTD: levETIRAcetam (KEPPRA) NICU IV syringe 5 mg/mL  1,000 mg Intravenous Once  . DISCONTD: neomycin-bacitracin-polymyxin   Topical TID   Continuous:   . sodium chloride 75 mL/hr at 05/29/11 0100    Assessment/Plan: 61 yo female with h/o large left cerebral hemisphere stroke who presented with worsening right leg weakness in the last 3-4 days.  1. New onset right lower leg weakness  - history of large left cerebral hemisphere stroke with residual right arm contracture and weakness in 2009- outpatient neurologist: Dr. Oliva Bustard. New weakness in right leg is unclear in etiology. no acute stroke on brain MRI or head CT. Possible Todd's paralysis if patient had seizure from missed Kepra doses.  *no meds Hospital Course:   - Head CT and brain MRI: no acute findings - neurology consulted and recommended a trial of Keppra IV load - failed swallow  study since patient cannot cough on demand  PLAN: - trial of IV Keppra load per neuro  - PT/OT to evaluate patient to review any new needs at home since her mobility has been   significantly reduced in the last couple of days. Currently patient has outpatient PT, speech therapy and 4 point cane at home with wheel chair.   2. Hypertension  - stable in the hospital * Meds: lisinopril 10mg  daily Carvedilol 6.25 daily Hospital Course:   - stable on home meds.  To do:  - monitor BP - continue home meds  3. Systolic Congestive Heart failure: EF: 15-20% with severe diffuse left ventricular hypokinesis Echo: 2009  - stable. No evidence of CHF exacerbation.  *Meds: * Digoxin qd * carvedilol 6.25 bid * lisinopril 10mg  daily  Hospital Course:  - Digoxin level: 1.2 on 05/17/11 - on warfarin for left ventricular hypokinesis: INR: 1.97 today. Was therapeutic yesterday  To do:  - monitor fluid status - check INR  4.   Hyperlipidemia:  stable *simvastatin 40 qd    --- FEN NPO since failed swallow study-does eat at home without problems *NS at 75cc/hr  --- PPx: on warfarin    LOS: 1 day   Anaja Monts 05/29/2011, 9:55 AM

## 2011-05-30 LAB — PROTIME-INR: INR: 1.95 — ABNORMAL HIGH (ref 0.00–1.49)

## 2011-05-30 LAB — CBC
HCT: 34.4 % — ABNORMAL LOW (ref 36.0–46.0)
MCV: 83.3 fL (ref 78.0–100.0)
Platelets: 268 10*3/uL (ref 150–400)
RBC: 4.13 MIL/uL (ref 3.87–5.11)
WBC: 8.4 10*3/uL (ref 4.0–10.5)

## 2011-05-30 LAB — BASIC METABOLIC PANEL
CO2: 23 mEq/L (ref 19–32)
Calcium: 8.7 mg/dL (ref 8.4–10.5)
Chloride: 108 mEq/L (ref 96–112)
Sodium: 141 mEq/L (ref 135–145)

## 2011-05-30 MED ORDER — BACITRACIN-NEOMYCIN-POLYMYXIN OINTMENT TUBE: 1.0000 "application " | TOPICAL_OINTMENT | CUTANEOUS | Status: DC | PRN

## 2011-05-30 MED ORDER — WARFARIN SODIUM 7.5 MG PO TABS
7.5000 mg | ORAL_TABLET | Freq: Once | ORAL | Status: DC
  Filled 2011-05-30: qty 1

## 2011-05-30 NOTE — Progress Notes (Signed)
PHARMACY - ANTICOAGULATION CONSULT Follow-up  Pharmacy Consult for: Coumadin  Indication: H/o CVA    Patient Data:   Allergies: No Known Allergies  Patient Measurements: Height: 5\' 7"  (170.2 cm) Weight: 123 lb 7.3 oz (56 kg) IBW/kg (Calculated) : 61.6    Vital Signs: Temp:  [98.2 F (36.8 C)-99 F (37.2 C)] 98.4 F (36.9 C) (05/08 0534) Pulse Rate:  [67-81] 70  (05/08 0843) Resp:  [16-18] 16  (05/08 0534) BP: (96-122)/(54-69) 118/69 mmHg (05/08 0843) SpO2:  [95 %-97 %] 97 % (05/08 0534)  Intake/Output from previous day:  Intake/Output Summary (Last 24 hours) at 05/30/11 1207 Last data filed at 05/29/11 1700  Gross per 24 hour  Intake    360 ml  Output      0 ml  Net    360 ml    Labs:  Basename 05/30/11 0515 05/29/11 0500 05/28/11 1745 05/28/11 1648  HGB 11.4* 12.6 -- --  HCT 34.4* 37.9 -- 41.6  PLT 268 300 -- 326  APTT -- -- -- --  LABPROT 22.6* 22.8* 25.2* --  INR 1.95* 1.97* 2.24* --  HEPARINUNFRC -- -- -- --  CREATININE 0.84 0.80 -- 0.86  CKTOTAL -- -- -- --  CKMB -- -- -- --  TROPONINI -- -- -- --   Estimated Creatinine Clearance: 61.4 ml/min (by C-G formula based on Cr of 0.84).  Medical History: Past Medical History  Diagnosis Date  . CVA (cerebral infarction)   . Hypertension     Scheduled medications:     . carvedilol  6.25 mg Oral BID WC  . digoxin  250 mcg Oral Daily  . levETIRAcetam  500 mg Oral BID  . lisinopril  10 mg Oral Daily  . neomycin-bacitracin-polymyxin   Topical TID  . simvastatin  40 mg Oral q1800  . warfarin  5 mg Oral q1800  . Warfarin - Pharmacist Dosing Inpatient   Does not apply q1800     Assessment:  61 y.o. female admitted on 05/28/2011, with h/o CVA admitted with new RLE weakness. Pharmacy consulted to manage Coumadin. INR is 1.95 (slightly below goal). No bleeding noted, Hgb trending down from admission, plts stable.   Goal of Therapy:  1. INR 2-3  Plan:  1. INR showing signs of trending down, will make  adjustment w/  tonight's coumadin dose, give coumadin 7.5mg  PO x1. 2. Daily PT / INR.   Juliette Alcide, PharmD, BCPS.  Pager: [phone] 05/30/2011 12:13 PM

## 2011-05-30 NOTE — Progress Notes (Signed)
Patient D/C instructions provided to patient and family. All questions answered to patient's satisfaction. D/C home with no signs of acute distress.

## 2011-05-30 NOTE — Care Management Note (Signed)
    Page 1 of 1   05/30/2011     4:35:20 PM   CARE MANAGEMENT NOTE 05/30/2011  Patient:  [name], [name]   Account Number:  [id number]  Date Initiated:  05/30/2011  Documentation initiated by:  Onnie Boer  Subjective/Objective Assessment:   PT WAS ADMITTED WITH CVA     Action/Plan:   PROGRESSION OF CARE AND DISCHARGE PLANNING   Anticipated DC Date:  05/30/2011   Anticipated DC Plan:  HOME/SELF CARE      DC Planning Services  CM consult      Choice offered to / List presented to:             Status of service:  Completed, signed off Medicare Important Message given?   (If response is "NO", the following Medicare IM given date fields will be blank) Date Medicare IM given:   Date Additional Medicare IM given:    Discharge Disposition:  HOME/SELF CARE  Per UR Regulation:    If discussed at Long Length of Stay Meetings, dates discussed:    Comments:  05/30/11 Onnie Boer, RN, BSN [phone] PT WAS ADMITTED WITH CVA.  PTA PT WAS AT HOME WITH SELF CARE.  PT HAS BEEN DC'D TO HOME WITH HER FAMILY AND HAS NO DC NEEDS.

## 2011-05-30 NOTE — Progress Notes (Signed)
OT Discharge Note  Patient is being discharged from OT services secondary to:    Spoke with PT Megan and no acute Ot needs at this time   Pt is at baseline per husband during PT evaluation  Please see latest Therapy Progress Note for current level of functioning and progress toward goals.   OT to sign off  Lucile Shutters   OTR/L Pager: [phone] Office: [phone] .

## 2011-05-30 NOTE — Progress Notes (Signed)
UR COMPLETED  

## 2011-05-31 NOTE — Discharge Summary (Signed)
Family Medicine Teaching Service  DISCHARGE SUMMARY   Patient name: [name] [name] Medical record number: [id number]  Date of birth: 1950/04/06 Age: 61 y.o. Gender: female  Attending Physician: Leighton Roach McDiarmid, MD  Primary Care Provider: Dois Davenport., MD, MD  Consultants: Neurology  Dates of Hospitalization: 05/28/2011 to 05/30/11  Length of Stay: 2 days  Admission Diagnoses:   1. Right lower extremity weakness 2. H/o CVA 3. HTN 4. Hyperlipidemia 5. Seizure disorder Discharge Diagnoses:   No resolved problems to display.  Active Hospital Problems   Diagnoses  Date Noted     .  Weakness  05/28/2011    .  History of CVA (cerebrovascular accident)  05/28/2011    .  HTN (hypertension)  05/28/2011    .  HLD (hyperlipidemia)  05/28/2011    .  Seizure disorder  05/28/2011       Resolved Hospital Problems    Diagnoses  Date Noted  Date Resolved    Patient Active Problem List   Diagnoses  Date Noted   .  Weakness  05/28/2011   .  History of CVA (cerebrovascular accident)  05/28/2011   .  HTN (hypertension)  05/28/2011   .  HLD (hyperlipidemia)  05/28/2011   .  Seizure disorder  05/28/2011    Brief Hospital Course   61 yo female with h/o large left cerebral hemispheric stroke who presented with worsening right leg weakness in the last 3-4 days.   New onset right lower leg weakness .  Patient has a history of large left cerebral hemisphere stroke with residual right arm contracture and weakness from 2009 and was found to have new onset weakness in the right leg. Head CT and brain MRI did not show any acute findings. Neurology was consulted and thought that weakness could be from Todd's paralysis from presumed focal seizure disorder with secondary generalization (seizure type not defined) secondary to missed home Kepra doses. Patient received a load of IV Kepra 1000mg  in the hospital. Patient's symptoms slowly resolved during admission. Physical Therapy was consulted and since  patient's mobility was back to her home baseline, no added PT intervention was needed. Patient did not meet any OT needs either. On discharge, She was indicating that she was at her baseline and that she was ready to resume her home therapy regimen that was established prior to admission.    Hypertension  Stable during hospitalization on home lisinopril 10mg  and carvedilol 6.25mg .   Systolic Congestive Heart failure: EF: 15-20% with severe diffuse left ventricular hypokinesis Echo: 2009  There was no evidence of fluid overload or CHF exacerbation on exam. Patient was continued on digoxin (last digoxin on 05/17/11 being therapeutic at 1.2), carvedilol 6.25 bid and lisinopril 10mg . Patient was also kept on warfarin for ventricular hypokinesis, which was adjusted per pharmacy. INR on discharge was 1.95 and warfarin was adjusted accordingly.   Hyperlipidemia:  Resumed home simvastatin 40mg  daily.   Significant Diagnostics:   LABS:  Basename  05/30/11 0515  05/29/11 0500  05/28/11 1648   WBC  8.4  10.6*  11.6*   HGB  11.4*  12.6  14.0   HCT  34.4*  37.9  41.6   PLT  268  300  326    Basename  05/30/11 0515  05/29/11 0500  05/28/11 1648  05/17/11 1437   NA  141  139  138  140   K  4.1  4.1  4.5  4.7   CL  108  104  101  104   CO2  23  22  26  25    BUN  17  16  15  15    CREATININE  0.84  0.80  0.86  0.91   CALCIUM  8.7  9.2  9.9  9.4   GLUCOSE  98  95  96  98    Basename  05/17/11 1437   ALT  10   AST  22   ALKPHOS  117   BILITOT  0.3   BILIDIR  --    Basename  05/17/11 1437   PROT  7.9   ALBUMIN  5.0   PREALBUMIN  --    Basename  05/17/11 1437   CHOL  191   TRIG  191*   HDL  46     Basename  05/17/11 1437   TSH  1.238   T3FREE  --   FREET4  --   T3TOTAL  --   T4TOTAL  --   THYROIDAB  --    Basename  05/30/11 0515  05/29/11 0500  05/28/11 1648   RDW  13.7  13.8  13.7   MCV  83.3  82.9  83.4   MCHC  33.1  33.2  33.7   MRET  --  --  --    Basename  05/30/11  0515  05/29/11 0500  05/28/11 1745   INR  1.95*  1.97*  2.24*   APTT  --  --  --     Alvira Philips  05/29/11 0905  05/17/11 1437   DIGOXIN  0.8  1.2   Urinalysis  Basename  05/28/11 1845   COLORURINE  YELLOW   APPEARANCEUR  CLEAR   LABSPEC  1.019   PHURINE  6.0   GLUCOSEU  NEGATIVE   HGBUR  NEGATIVE   BILIRUBINUR  NEGATIVE   KETONESUR  NEGATIVE   PROTEINUR  NEGATIVE   UROBILINOGEN  0.2   NITRITE  NEGATIVE   LEUKOCYTESUR  SMALL*     IMAGING:  Ct Head Wo Contrast  05/28/2011 *RADIOLOGY REPORT* Clinical Data: 61 year old female recently unable to walk. Right side pain. History of 2009 stroke. CT HEAD WITHOUT CONTRAST Technique: Contiguous axial images were obtained from the base of the skull through the vertex without contrast. Comparison: 08/26/2008 and earlier. Findings: Visualized paranasal sinuses and mastoids are clear. Visualized orbits and scalp soft tissues are within normal limits. No acute osseous abnormality identified. Calcified atherosclerosis at the skull base. Chronic encephalomalacia in the left MCA territory with ex vacuo changes to the ventricles. Chronic right anterior external capsule lacunar infarct has not significantly changed. Hypodensity in the ventral left thalamus does appear increased, but chronic in nature. Sequelae of layering degeneration at the mid brain. No midline shift, mass effect, or evidence of mass lesion. No acute intracranial hemorrhage identified. No evidence of cortically based acute infarction identified. No suspicious intracranial vascular hyperdensity. IMPRESSION: Chronic ischemic disease with some progression in the left hemisphere since 2010, but no acute intracranial abnormality. Original Report Authenticated By: Harley Hallmark, M.D.  Mr Brain Wo Contrast  05/28/2011 *RADIOLOGY REPORT* Clinical Data: 61 year old female with recent difficulties with speech. History of left brain stroke in 2009. MRI HEAD WITHOUT CONTRAST Technique: Multiplanar, multiecho  pulse sequences of the brain and surrounding structures were obtained according to standard protocol without intravenous contrast. Comparison: Head CT from the same day and earlier. Findings: Widespread left MCA territory encephalomalacia. Involvement of the left deep gray matter nuclei including the left  thalamus. Diffusion imaging is without convincing area of restricted diffusion to suggest acute infarct. Ex vacuo changes to the ventricles. No midline shift, mass effect, or evidence of mass lesion. Small chronic micro hemorrhages in the brainstem. No acute intracranial hemorrhage identified. Negative pituitary, cervicomedullary junction and visualized cervical spine. Major intracranial vascular flow voids are preserved. Chronic white matter infarcts in the right corona radiata extending toward the external capsule have not significantly changed. Wallerian degeneration in the brainstem and interval increased pontine white matter T2 and FLAIR hyperintensity. Mildly increased cerebellar gray matter T2 and FLAIR hyperintensity. Rightward gaze deviation. Visualized orbit soft tissues are within normal limits. Visualized bone marrow signal is within normal limits. Visualized paranasal sinuses and mastoids are clear. Negative scalp soft tissues. IMPRESSION: 1. No acute intracranial abnormality. 2. Extensive left MCA and deep gray matter nuclei chronic ischemia. Comparatively mild brainstem and cerebellar white matter signal abnormality, likely due to small vessel disease. Original Report Authenticated By: Harley Hallmark, M.D.  Procedures:   none Discharge Vitals and Exam:   Vitals:  Patient Vitals for the past 24 hrs:   BP  Temp  Temp src  Pulse  Resp  SpO2   05/30/11 0843  118/69 mmHg  -  -  70  -  -   05/30/11 0534  122/62 mmHg  98.4 F (36.9 C)  Oral  67  16  97 %   05/29/11 2126  96/54 mmHg  99 F (37.2 C)  Oral  81  16  95 %   05/29/11 1711  97/60 mmHg  -  -  78  -  -   05/29/11 1400  103/63 mmHg  98.2  F (36.8 C)  Oral  70  18  97 %    Wt Readings from Last 3 Encounters:   05/29/11  123 lb 7.3 oz (56 kg)   05/17/11  124 lb (56.246 kg)   Intake/Output Summary (Last 24 hours) at 05/30/11 0951 Last data filed at 05/29/11 1700   Gross per 24 hour   Intake  360 ml   Output  0 ml   Net  360 ml   PE:  GENERAL: Adult AA female. Examined in [facility] Laying comfortably in bed In no discomfort; norespiratory distress.  PSYCH: Alert and appropriately interactive; expressive dysphasia but appears to have intact comprehension H&N: AT/[place], MMM, no scleral icterus, EOMi THORAX:  HEART: RRR, S1/S2 heard, 2/6 murmur  LUNGS: CTA B, no wheezes, no crackles ABDOMEN: +BS, soft, non-tender, no rigidity, no guarding, no masses/organomegaly EXTREMITIES: Spastic flexion contracture of R UE;  RLE with sustained clonus. LLE and LUE with 5/5 strength in all myotomes; RLE 3/5 hip flexion; 0/5 vs apraxia to ankle dorsiflexion or plantar flexion >NEURO: minimal facial droop appreciated on R .    Discharge Information   Disposition: To home Discharge Diet: Resume diet  Discharge Condition: Improved  Discharge Activity: Ad lib  Medication List  As of 05/31/2011  2:02 AM   TAKE these medications         carvedilol 12.5 MG tablet   Commonly known as: COREG   Take 6.25 mg by mouth 2 (two) times daily with a meal.      digoxin 0.25 MG tablet   Commonly known as: LANOXIN   Take 250 mcg by mouth daily. Need office visit for additional refills.      levETIRAcetam 500 MG tablet   Commonly known as: KEPPRA   Take 500 mg by mouth 2 (two)  times daily.      lisinopril 10 MG tablet   Commonly known as: PRINIVIL,ZESTRIL   Take 10 mg by mouth daily.      neomycin-bacitracin-polymyxin Oint   Commonly known as: NEOSPORIN   Apply 1 application topically as needed. To toe      pravastatin 80 MG tablet   Commonly known as: PRAVACHOL   Take 80 mg by mouth daily.      warfarin 5 MG tablet   Commonly known as: COUMADIN     Take 5 mg by mouth daily.            Follow Up Issues and Recommendations:    Discharge Orders    Future Appointments:  Provider:  Department:  Dept Phone:  Center:    06/11/2011 3:00 PM  Dois Davenport, MD  [facility]  [phone]  [facility]    06/11/2011 4:00 PM  Sondra Barges, PA-C  [facility]  [phone]  [facility]     Pending Results: none  Issues and Medications to address:  INR as it was at 1.9 on discharge.  Mobility and right leg strength Right big toe paronychia: treated with neosporin ointment  Andrena Mews, DO  [facility] Resident - PGY-1  05/30/2011 9:51 AM

## 2011-06-01 NOTE — Discharge Summary (Signed)
I discussed with Dr Berline Chough.  I agree with their plans documented in their discharge note.

## 2011-06-11 ENCOUNTER — Ambulatory Visit (INDEPENDENT_AMBULATORY_CARE_PROVIDER_SITE_OTHER): Payer: Medicare Other | Admitting: Family Medicine

## 2011-06-11 ENCOUNTER — Ambulatory Visit: Payer: Medicare Other | Admitting: Physician Assistant

## 2011-06-11 ENCOUNTER — Encounter: Payer: Self-pay | Admitting: Physician Assistant

## 2011-06-11 VITALS — BP 120/65 | HR 66 | Temp 97.8°F | Resp 18 | Ht 67.0 in

## 2011-06-11 DIAGNOSIS — Z7901 Long term (current) use of anticoagulants: Secondary | ICD-10-CM

## 2011-06-11 DIAGNOSIS — G40909 Epilepsy, unspecified, not intractable, without status epilepticus: Secondary | ICD-10-CM

## 2011-06-11 DIAGNOSIS — L600 Ingrowing nail: Secondary | ICD-10-CM

## 2011-06-11 DIAGNOSIS — I619 Nontraumatic intracerebral hemorrhage, unspecified: Secondary | ICD-10-CM

## 2011-06-11 DIAGNOSIS — Z0000 Encounter for general adult medical examination without abnormal findings: Secondary | ICD-10-CM

## 2011-06-11 DIAGNOSIS — Z8679 Personal history of other diseases of the circulatory system: Secondary | ICD-10-CM

## 2011-06-11 DIAGNOSIS — E785 Hyperlipidemia, unspecified: Secondary | ICD-10-CM

## 2011-06-11 DIAGNOSIS — I10 Essential (primary) hypertension: Secondary | ICD-10-CM

## 2011-06-11 NOTE — Progress Notes (Signed)
   Patient ID: [name] MRN: [id number], DOB: 20-Mar-1950, 61 y.o. Date of Encounter: 06/11/2011, 6:21 PM  See Dr. Wendee Copp note from today for history and A/P.   PROCEDURE NOTE: Verbal consent obtained. Sterile technique employed. Numbing: Anesthesia obtained with 1:1 ratio 2% plain lidocaine with 0.5% Marcaine  Betadine prep per usual protocol.  Total nail lifted without difficulty and removed in total. Proximal aspect of nail bed explored revealing no nail remnants. Ingrown tissue debrided and irrigated. Xeroform dressing applied. Wound care instructions including precautions covered with patient. Handout given.   SignedEula Listen, PA-C 06/11/2011 6:21 PM

## 2011-06-12 ENCOUNTER — Telehealth: Payer: Self-pay

## 2011-06-12 NOTE — Telephone Encounter (Signed)
[name] PERFORMED A PROCEDURE ON TOE YESTERDAY AND HUSBAND HAS A QUESTION ON HOW TO DRESS IT

## 2011-06-12 NOTE — Telephone Encounter (Signed)
Tried to call patient but the voicemail has not been set up yet.  We will need to try to contact patient again later. LA,cma

## 2011-06-12 NOTE — Telephone Encounter (Signed)
Soak the foot in warm soapy water for 5 minutes daily until the yellow gauze comes off. After that let the foot air dry and can place a regular band-aid on the toe.

## 2011-06-14 NOTE — Telephone Encounter (Signed)
Spoke w/husband and gave him instr's from [place]. He stated that the gauze has come off and he is keeping a band aid on it.

## 2011-06-17 ENCOUNTER — Encounter: Payer: Self-pay | Admitting: Family Medicine

## 2011-06-17 NOTE — Progress Notes (Signed)
  Subjective:    Patient ID: [name], female    DOB: [birth year], 61 y.o.   MRN: [id number]  HPI  Patient presents for CPE   Review of Systems See scanned intake form    Objective:   Physical Exam  Constitutional: She appears well-developed.  HENT:  Head: Normocephalic and atraumatic.  Right Ear: External ear normal.  Left Ear: External ear normal.  Nose: Nose normal.  Mouth/Throat: Oropharynx is clear and moist.  Eyes: EOM are normal. Pupils are equal, round, and reactive to light.  Neck: Neck supple. No JVD present. No thyromegaly present.       No carotid, supra or infraclavicular bruits (B)  Cardiovascular: Normal rate, regular rhythm, normal heart sounds and intact distal pulses.   Pulmonary/Chest: Effort normal and breath sounds normal.  Abdominal: Soft. Bowel sounds are normal.       No HSM  Musculoskeletal:       Right shoulder: Decreased range of motion: contractures of (R) UE and (R) LE.  Neurological: She is alert. No sensory deficit.       Dysarthria though speak much more intelligible Dense (R) sided hemiplegia   Skin: Skin is warm. Lesion: no decubitus. Clubbing: ingrown (R) toenail; s/p removal(see PA surgical note)  Psychiatric: She has a normal mood and affect.          Assessment & Plan:   1. Routine general medical examination at a health care facility   2. HTN (hypertension)   3. Dyslipidemia   4. Seizure disorder   5. Chronic anticoagulation   6. Stroke due to intracerebral hemorrhage   7. H/O CHF   8. Ingrown toenail      See labs from 05/28/11 Coumadin monitoring per [facility] Continue current medications Referral to Dr. Jacinto Halim for 2 D Echo to see if Digoxin still necessary. Wound care for ingrown nail Dr. Audria Nine to care for patient in follow up

## 2011-06-25 ENCOUNTER — Telehealth: Payer: Self-pay | Admitting: *Deleted

## 2011-06-25 ENCOUNTER — Ambulatory Visit: Payer: Medicare Other | Admitting: Family Medicine

## 2011-06-25 ENCOUNTER — Encounter: Payer: Self-pay | Admitting: Family Medicine

## 2011-06-25 VITALS — BP 119/68 | HR 69 | Temp 97.7°F | Resp 18 | Ht 66.5 in

## 2011-06-25 DIAGNOSIS — Z87891 Personal history of nicotine dependence: Secondary | ICD-10-CM | POA: Insufficient documentation

## 2011-06-25 DIAGNOSIS — G40909 Epilepsy, unspecified, not intractable, without status epilepticus: Secondary | ICD-10-CM

## 2011-06-25 DIAGNOSIS — E785 Hyperlipidemia, unspecified: Secondary | ICD-10-CM

## 2011-06-25 DIAGNOSIS — L600 Ingrowing nail: Secondary | ICD-10-CM

## 2011-06-25 DIAGNOSIS — I10 Essential (primary) hypertension: Secondary | ICD-10-CM

## 2011-06-25 DIAGNOSIS — I639 Cerebral infarction, unspecified: Secondary | ICD-10-CM | POA: Insufficient documentation

## 2011-06-25 LAB — LIPID PANEL
HDL: 40 mg/dL (ref >39)
Total CHOL/HDL Ratio: 4.3 Ratio
Triglycerides: 140 mg/dL (ref <150)

## 2011-06-25 LAB — COMPREHENSIVE METABOLIC PANEL
Alkaline Phosphatase: 102 U/L (ref 39–117)
Creat: 0.95 mg/dL (ref 0.50–1.10)
Glucose, Bld: 87 mg/dL (ref 70–99)
Sodium: 139 mEq/L (ref 135–145)
Total Bilirubin: 0.3 mg/dL (ref 0.3–1.2)
Total Protein: 7.7 g/dL (ref 6.0–8.3)

## 2011-06-25 MED ORDER — ACETAMINOPHEN-CODEINE #3 300-30 MG PO TABS: 1.0000 | ORAL_TABLET | ORAL | Status: AC | PRN

## 2011-06-25 NOTE — Progress Notes (Signed)
  Subjective:    Patient ID: [name], female    DOB: 24-Apr-1950, 61 y.o.   MRN: [id number]  HPI  1) Patient presents in follow up of (R) first toenail removal.     States some pain and requests pain medication  2) (L) MCA cardio embolic stroke on chronic coumadin therapy-              - continues to make gains in speech and ROM of (R) extremity to prevent contractures.  3)I Systolic CHF         - ischemic cardiomyopathy with reduced EF            Recent OV to Dr. Jacinto Halim to reassess cardiac function and if improved the need to continue            digoxin   4) Seizure disorder controlled on Keppra         -extensive workup to include structural, metabolic and infectious causes.            Work up did not reveal cause of seizures.(Dr. Shelly Flatten, [name])      Review of Systems     Objective:   Physical Exam  Constitutional: She appears well-developed.  Neck: Neck supple. No thyromegaly present.       No carotid, supra or infraclavicular bruits (B)  Cardiovascular: Normal rate, regular rhythm, normal heart sounds and intact distal pulses.   Pulmonary/Chest: Effort normal and breath sounds normal.  Abdominal: Soft. Bowel sounds are normal.  Neurological: She is alert.       Dense (R) hemiplegia Dysarthric speech  Skin:       Resection of ingrown nail healing without erythema, induration or drainage          Assessment & Plan:   1. Dyslipidemia  Lipid panel, Comprehensive metabolic panel  2. (L) MCA cardio embolic stroke   Continue with stroke rehabilitation   chronic anticoagulation  3. HTN (hypertension)  Comprehensive metabolic panel  4. Seizure disorder     extensive workup without clear etiology  5. Ingrown toenail  acetaminophen-codeine (TYLENOL #3) 300-30 MG per tablet  6. H/O tobacco use     Continue all medications Patient to keep follow up with Dr. Jacinto Halim to assess cardiac function and need for digoxin. Anticipatory guidance

## 2011-06-25 NOTE — Telephone Encounter (Signed)
Called prescription to [facility] @9 :19a [phone] for Tylenol #3 w/ no refills, per Dr Hal Hope.

## 2011-08-09 ENCOUNTER — Other Ambulatory Visit: Payer: Self-pay | Admitting: Physician Assistant

## 2011-08-24 ENCOUNTER — Encounter: Payer: Self-pay | Admitting: Physician Assistant

## 2011-08-24 DIAGNOSIS — I429 Cardiomyopathy, unspecified: Secondary | ICD-10-CM | POA: Insufficient documentation

## 2011-08-24 DIAGNOSIS — I43 Cardiomyopathy in diseases classified elsewhere: Secondary | ICD-10-CM

## 2011-08-24 DIAGNOSIS — I639 Cerebral infarction, unspecified: Secondary | ICD-10-CM

## 2011-08-24 DIAGNOSIS — E785 Hyperlipidemia, unspecified: Secondary | ICD-10-CM

## 2011-08-24 DIAGNOSIS — I10 Essential (primary) hypertension: Secondary | ICD-10-CM

## 2011-09-19 ENCOUNTER — Encounter: Payer: Self-pay | Admitting: Physician Assistant

## 2011-09-19 DIAGNOSIS — Z7901 Long term (current) use of anticoagulants: Secondary | ICD-10-CM

## 2011-11-15 ENCOUNTER — Encounter: Payer: Self-pay | Admitting: Family Medicine

## 2011-11-15 DIAGNOSIS — Z7901 Long term (current) use of anticoagulants: Secondary | ICD-10-CM

## 2011-11-16 ENCOUNTER — Encounter: Payer: Self-pay | Admitting: Family Medicine

## 2012-02-11 ENCOUNTER — Other Ambulatory Visit: Payer: Self-pay | Admitting: *Deleted

## 2012-02-11 MED ORDER — LISINOPRIL 10 MG PO TABS: 10.0000 mg | ORAL_TABLET | Freq: Every day | ORAL | Status: DC

## 2012-02-12 ENCOUNTER — Other Ambulatory Visit: Payer: Self-pay | Admitting: Physician Assistant

## 2012-03-11 ENCOUNTER — Other Ambulatory Visit: Payer: Self-pay | Admitting: Physician Assistant

## 2012-03-13 ENCOUNTER — Telehealth: Payer: Self-pay

## 2012-03-13 NOTE — Telephone Encounter (Signed)
PT SAYS PHARMACY HAS SENT OVER A REQUEST FOR A REFILL ON THE LISINOPRIL SEVERAL TIMES, BUT THEY HAVE NOT HEARD FROM [place].  PLEASE ADVISE PT ASAP. [phone]

## 2012-03-13 NOTE — Telephone Encounter (Signed)
We sent in the Rx for Lisinopril to the pharmacy I have advised patient. #15 is the most we can send in, she is overdue for appt.

## 2012-03-26 ENCOUNTER — Telehealth: Payer: Self-pay

## 2012-03-26 NOTE — Telephone Encounter (Signed)
DUPLICATE

## 2012-03-26 NOTE — Telephone Encounter (Signed)
NEEDS REFILL ON ALL CURRENT MEDS. MADE APPT FOR 3/21 W/DR Clelia Croft

## 2012-04-01 MED ORDER — DIGOXIN 250 MCG PO TABS: 0.2500 mg | ORAL_TABLET | Freq: Every day | ORAL | Status: DC

## 2012-04-01 MED ORDER — LISINOPRIL 10 MG PO TABS: 10.0000 mg | ORAL_TABLET | Freq: Every day | ORAL | Status: DC

## 2012-04-01 MED ORDER — PRAVASTATIN SODIUM 80 MG PO TABS: 80.0000 mg | ORAL_TABLET | Freq: Every day | ORAL | Status: DC

## 2012-04-01 NOTE — Telephone Encounter (Signed)
Pt's husband came into 69 and Darl Pikes called here to ask about RF of meds until pt's appt on 04/11/12. He wasn't sure which meds she needed to have RFd. I called [facility] and pharmacist advised that pt needs RFs of lisinopril, pravastatin and digoxin. I authorized 1 mos RFs of all three since pt has appt scheduled w/in the month.

## 2012-04-11 ENCOUNTER — Encounter: Payer: Self-pay | Admitting: Family Medicine

## 2012-04-11 ENCOUNTER — Ambulatory Visit (INDEPENDENT_AMBULATORY_CARE_PROVIDER_SITE_OTHER): Payer: Medicare Other | Admitting: Family Medicine

## 2012-04-11 ENCOUNTER — Ambulatory Visit: Payer: Medicare Other

## 2012-04-11 VITALS — BP 106/76 | HR 68 | Temp 98.1°F | Resp 16 | Ht 66.5 in | Wt 124.4 lb

## 2012-04-11 DIAGNOSIS — E785 Hyperlipidemia, unspecified: Secondary | ICD-10-CM

## 2012-04-11 DIAGNOSIS — L03039 Cellulitis of unspecified toe: Secondary | ICD-10-CM

## 2012-04-11 DIAGNOSIS — M722 Plantar fascial fibromatosis: Secondary | ICD-10-CM

## 2012-04-11 DIAGNOSIS — M79671 Pain in right foot: Secondary | ICD-10-CM

## 2012-04-11 DIAGNOSIS — I10 Essential (primary) hypertension: Secondary | ICD-10-CM

## 2012-04-11 DIAGNOSIS — L03031 Cellulitis of right toe: Secondary | ICD-10-CM

## 2012-04-11 DIAGNOSIS — Z79899 Other long term (current) drug therapy: Secondary | ICD-10-CM

## 2012-04-11 DIAGNOSIS — G40909 Epilepsy, unspecified, not intractable, without status epilepticus: Secondary | ICD-10-CM

## 2012-04-11 DIAGNOSIS — M79609 Pain in unspecified limb: Secondary | ICD-10-CM

## 2012-04-11 DIAGNOSIS — Z8673 Personal history of transient ischemic attack (TIA), and cerebral infarction without residual deficits: Secondary | ICD-10-CM

## 2012-04-11 DIAGNOSIS — I517 Cardiomegaly: Secondary | ICD-10-CM

## 2012-04-11 LAB — CBC WITH DIFFERENTIAL/PLATELET
Basophils Absolute: 0 10*3/uL (ref 0.0–0.1)
Eosinophils Relative: 1 % (ref 0–5)
HCT: 38.1 % (ref 36.0–46.0)
Hemoglobin: 12.7 g/dL (ref 12.0–15.0)
Lymphocytes Relative: 39 % (ref 12–46)
Lymphs Abs: 2.7 10*3/uL (ref 0.7–4.0)
MCV: 81.2 fL (ref 78.0–100.0)
Monocytes Absolute: 0.6 10*3/uL (ref 0.1–1.0)
Neutro Abs: 3.6 10*3/uL (ref 1.7–7.7)
RBC: 4.69 MIL/uL (ref 3.87–5.11)
RDW: 14.2 % (ref 11.5–15.5)
WBC: 7 10*3/uL (ref 4.0–10.5)

## 2012-04-11 LAB — COMPREHENSIVE METABOLIC PANEL
ALT: 10 U/L (ref 0–35)
AST: 18 U/L (ref 0–37)
Alkaline Phosphatase: 109 U/L (ref 39–117)
Calcium: 9.3 mg/dL (ref 8.4–10.5)
Chloride: 104 mEq/L (ref 96–112)
Creat: 0.77 mg/dL (ref 0.50–1.10)
Potassium: 4.9 mEq/L (ref 3.5–5.3)

## 2012-04-11 LAB — LIPID PANEL: LDL Cholesterol: 87 mg/dL (ref 0–99)

## 2012-04-11 MED ORDER — CEPHALEXIN 500 MG PO CAPS: 500.0000 mg | ORAL_CAPSULE | Freq: Four times a day (QID) | ORAL | Status: DC

## 2012-04-11 MED ORDER — MUPIROCIN 2 % EX OINT: TOPICAL_OINTMENT | Freq: Three times a day (TID) | CUTANEOUS | Status: DC

## 2012-04-11 MED ORDER — LISINOPRIL 10 MG PO TABS: 10.0000 mg | ORAL_TABLET | Freq: Every day | ORAL | Status: DC

## 2012-04-11 MED ORDER — PRAVASTATIN SODIUM 80 MG PO TABS: 80.0000 mg | ORAL_TABLET | Freq: Every day | ORAL | Status: DC

## 2012-04-11 MED ORDER — LEVETIRACETAM 500 MG PO TABS: 500.0000 mg | ORAL_TABLET | Freq: Two times a day (BID) | ORAL | Status: DC

## 2012-04-11 MED ORDER — OMEPRAZOLE 20 MG PO CPDR: 20.0000 mg | DELAYED_RELEASE_CAPSULE | Freq: Every day | ORAL | Status: DC

## 2012-04-11 MED ORDER — DIGOXIN 250 MCG PO TABS: 0.2500 mg | ORAL_TABLET | Freq: Every day | ORAL | Status: DC

## 2012-04-11 MED ORDER — ACETAMINOPHEN-CODEINE #3 300-30 MG PO TABS: 1.0000 | ORAL_TABLET | ORAL | Status: DC | PRN

## 2012-04-11 MED ORDER — CARVEDILOL 6.25 MG PO TABS: 6.2500 mg | ORAL_TABLET | Freq: Two times a day (BID) | ORAL | Status: DC

## 2012-04-11 NOTE — Patient Instructions (Addendum)
Plantar Fasciitis (Heel Spur Syndrome) with Rehab The plantar fascia is a fibrous, ligament-like, soft-tissue structure that spans the bottom of the foot. Plantar fasciitis is a condition that causes pain in the foot due to inflammation of the tissue. SYMPTOMS   Pain and tenderness on the underneath side of the foot.  Pain that worsens with standing or walking. CAUSES  Plantar fasciitis is caused by irritation and injury to the plantar fascia on the underneath side of the foot. Common mechanisms of injury include:  Direct trauma to bottom of the foot.  Damage to a small nerve that runs under the foot where the main fascia attaches to the heel bone.  Stress placed on the plantar fascia due to bone spurs. RISK INCREASES WITH:   Activities that place stress on the plantar fascia (running, jumping, pivoting, or cutting).  Poor strength and flexibility.  Improperly fitted shoes.  Tight calf muscles.  Flat feet.  Failure to warm-up properly before activity.  Obesity. PREVENTION  Warm up and stretch properly before activity.  Allow for adequate recovery between workouts.  Maintain physical fitness:  Strength, flexibility, and endurance.  Cardiovascular fitness.  Maintain a health body weight.  Avoid stress on the plantar fascia.  Wear properly fitted shoes, including arch supports for individuals who have flat feet. PROGNOSIS  If treated properly, then the symptoms of plantar fasciitis usually resolve without surgery. However, occasionally surgery is necessary. RELATED COMPLICATIONS   Recurrent symptoms that may result in a chronic condition.  Problems of the lower back that are caused by compensating for the injury, such as limping.  Pain or weakness of the foot during push-off following surgery.  Chronic inflammation, scarring, and partial or complete fascia tear, occurring more often from repeated injections. TREATMENT  Treatment initially involves the use of  ice and medication to help reduce pain and inflammation. The use of strengthening and stretching exercises may help reduce pain with activity, especially stretches of the Achilles tendon. These exercises may be performed at home or with a therapist. Your caregiver may recommend that you use heel cups of arch supports to help reduce stress on the plantar fascia. Occasionally, corticosteroid injections are given to reduce inflammation. If symptoms persist for greater than 6 months despite non-surgical (conservative), then surgery may be recommended.  MEDICATION   If pain medication is necessary, then nonsteroidal anti-inflammatory medications, such as aspirin and ibuprofen, or other minor pain relievers, such as acetaminophen, are often recommended.  Do not take pain medication within 7 days before surgery.  Prescription pain relievers may be given if deemed necessary by your caregiver. Use only as directed and only as much as you need.  Corticosteroid injections may be given by your caregiver. These injections should be reserved for the most serious cases, because they may only be given a certain number of times. HEAT AND COLD  Cold treatment (icing) relieves pain and reduces inflammation. Cold treatment should be applied for 10 to 15 minutes every 2 to 3 hours for inflammation and pain and immediately after any activity that aggravates your symptoms. Use ice packs or massage the area with a piece of ice (ice massage).  Heat treatment may be used prior to performing the stretching and strengthening activities prescribed by your caregiver, physical therapist, or athletic trainer. Use a heat pack or soak the injury in warm water. SEEK IMMEDIATE MEDICAL CARE IF:  Treatment seems to offer no benefit, or the condition worsens.  Any medications produce adverse side effects. EXERCISES RANGE   OF MOTION (ROM) AND STRETCHING EXERCISES - Plantar Fasciitis (Heel Spur Syndrome) These exercises may help you  when beginning to rehabilitate your injury. Your symptoms may resolve with or without further involvement from your physician, physical therapist or athletic trainer. While completing these exercises, remember:   Restoring tissue flexibility helps normal motion to return to the joints. This allows healthier, less painful movement and activity.  An effective stretch should be held for at least 30 seconds.  A stretch should never be painful. You should only feel a gentle lengthening or release in the stretched tissue. RANGE OF MOTION - Toe Extension, Flexion  Sit with your right / left leg crossed over your opposite knee.  Grasp your toes and gently pull them back toward the top of your foot. You should feel a stretch on the bottom of your toes and/or foot.  Hold this stretch for __________ seconds.  Now, gently pull your toes toward the bottom of your foot. You should feel a stretch on the top of your toes and or foot.  Hold this stretch for __________ seconds. Repeat __________ times. Complete this stretch __________ times per day.  RANGE OF MOTION - Ankle Dorsiflexion, Active Assisted  Remove shoes and sit on a chair that is preferably not on a carpeted surface.  Place right / left foot under knee. Extend your opposite leg for support.  Keeping your heel down, slide your right / left foot back toward the chair until you feel a stretch at your ankle or calf. If you do not feel a stretch, slide your bottom forward to the edge of the chair, while still keeping your heel down.  Hold this stretch for __________ seconds. Repeat __________ times. Complete this stretch __________ times per day.  STRETCH  Gastroc, Standing  Place hands on wall.  Extend right / left leg, keeping the front knee somewhat bent.  Slightly point your toes inward on your back foot.  Keeping your right / left heel on the floor and your knee straight, shift your weight toward the wall, not allowing your back to  arch.  You should feel a gentle stretch in the right / left calf. Hold this position for __________ seconds. Repeat __________ times. Complete this stretch __________ times per day. STRETCH  Soleus, Standing  Place hands on wall.  Extend right / left leg, keeping the other knee somewhat bent.  Slightly point your toes inward on your back foot.  Keep your right / left heel on the floor, bend your back knee, and slightly shift your weight over the back leg so that you feel a gentle stretch deep in your back calf.  Hold this position for __________ seconds. Repeat __________ times. Complete this stretch __________ times per day. STRETCH  Gastrocsoleus, Standing  Note: This exercise can place a lot of stress on your foot and ankle. Please complete this exercise only if specifically instructed by your caregiver.   Place the ball of your right / left foot on a step, keeping your other foot firmly on the same step.  Hold on to the wall or a rail for balance.  Slowly lift your other foot, allowing your body weight to press your heel down over the edge of the step.  You should feel a stretch in your right / left calf.  Hold this position for __________ seconds.  Repeat this exercise with a slight bend in your right / left knee. Repeat __________ times. Complete this stretch __________ times per day.    STRENGTHENING EXERCISES - Plantar Fasciitis (Heel Spur Syndrome)  These exercises may help you when beginning to rehabilitate your injury. They may resolve your symptoms with or without further involvement from your physician, physical therapist or athletic trainer. While completing these exercises, remember:   Muscles can gain both the endurance and the strength needed for everyday activities through controlled exercises.  Complete these exercises as instructed by your physician, physical therapist or athletic trainer. Progress the resistance and repetitions only as guided. STRENGTH - Towel  Curls  Sit in a chair positioned on a non-carpeted surface.  Place your foot on a towel, keeping your heel on the floor.  Pull the towel toward your heel by only curling your toes. Keep your heel on the floor.  If instructed by your physician, physical therapist or athletic trainer, add ____________________ at the end of the towel. Repeat __________ times. Complete this exercise __________ times per day. STRENGTH - Ankle Inversion  Secure one end of a rubber exercise band/tubing to a fixed object (table, pole). Loop the other end around your foot just before your toes.  Place your fists between your knees. This will focus your strengthening at your ankle.  Slowly, pull your big toe up and in, making sure the band/tubing is positioned to resist the entire motion.  Hold this position for __________ seconds.  Have your muscles resist the band/tubing as it slowly pulls your foot back to the starting position. Repeat __________ times. Complete this exercises __________ times per day.  Document Released: 01/08/2005 Document Revised: 04/02/2011 Document Reviewed: 04/22/2008 [facility] Patient Information 2013 [place], [place].   Paronychia Paronychia is an inflammatory reaction involving the folds of the skin surrounding the fingernail. This is commonly caused by an infection in the skin around a nail. The most common cause of paronychia is frequent wetting of the hands (as seen with bartenders, food servers, nurses or others who wet their hands). This makes the skin around the fingernail susceptible to infection by bacteria (germs) or fungus. Other predisposing factors are:  Aggressive manicuring.  Nail biting.  Thumb sucking. The most common cause is a staphylococcal (a type of germ) infection, or a fungal (Candida) infection. When caused by a germ, it usually comes on suddenly with redness, swelling, pus and is often painful. It may get under the nail and form an abscess (collection of  pus), or form an abscess around the nail. If the nail itself is infected with a fungus, the treatment is usually prolonged and may require oral medicine for up to one year. Your caregiver will determine the length of time treatment is required. The paronychia caused by bacteria (germs) may largely be avoided by not pulling on hangnails or picking at cuticles. When the infection occurs at the tips of the finger it is called felon. When the cause of paronychia is from the herpes simplex virus (HSV) it is called herpetic whitlow. TREATMENT  When an abscess is present treatment is often incision and drainage. This means that the abscess must be cut open so the pus can get out. When this is done, the following home care instructions should be followed. HOME CARE INSTRUCTIONS   It is important to keep the affected fingers very dry. Rubber or plastic gloves over cotton gloves should be used whenever the hand must be placed in water.  Keep wound clean, dry and dressed as suggested by your caregiver between warm soaks or warm compresses.  Soak in warm water for fifteen to twenty minutes three  to four times per day for bacterial infections. Fungal infections are very difficult to treat, so often require treatment for long periods of time.  For bacterial (germ) infections take antibiotics (medicine which kill germs) as directed and finish the prescription, even if the problem appears to be solved before the medicine is gone.  Only take over-the-counter or prescription medicines for pain, discomfort, or fever as directed by your caregiver. SEEK IMMEDIATE MEDICAL CARE IF:  You have redness, swelling, or increasing pain in the wound.  You notice pus coming from the wound.  You have a fever.  You notice a bad smell coming from the wound or dressing. Document Released: 07/04/2000 Document Revised: 04/02/2011 Document Reviewed: 03/05/2008 [facility] Patient Information 2013 [place], [place].

## 2012-04-11 NOTE — Progress Notes (Signed)
Subjective:    Patient ID: [name], female    DOB: 04-29-1950, 62 y.o.   MRN: [id number] Chief Complaint  Patient presents with  . Follow-up  . Hyperlipidemia  . Hypertension  . right foot hurts x 2 weeks    HPI  [name] is a 62 yo woman with an unfortunate medical history of a Left cardio-embolic MCA stroke which has left her with right hemiparesis and severe dysarthria.  She is here with her husband who helps with the history.  She has not been here in over 9 months, despite several chronic illnesses. Her prior PCP was Dr. Hal [name] who is no longer working here. She was previously also followed by specialists but reports she is no longer actively seeing any of them. Reports that she saw Dr. Nadara [name], cardiology, saw several times and he didn't want her to come back.  last saw the neurologist for her seizure d/o last yr - has seen 3 different neurologist so hasn't established a realationship with any - they think that they said that they also did not need to see her anymore, just have her Keppra rx'ed by her PCP.  No seizures since she had the stroke in 2009. Her seizure was prior to her stroke - unsure if it was unprovoked or not. Has been on Keppra for the last 5 yrs and seizure free.  Does see [name] at [facility] - I think this is probably a nurse - they follow her coumadin only and might do her vitals (BP checks?) - but pt and her husband do not agree on this.  C/o pain in her right heal with walking x 2 wks.  Also medial aspect of Rt first toe with serosanguinous drainage and pain.  Past Medical History  Diagnosis Date  . CVA (cerebral infarction)   . Hypertension   . Seizures   . Stroke    Past Surgical History  Procedure Laterality Date  . Hip surgery    . Fracture surgery     Current Outpatient Prescriptions on File Prior to Visit  Medication Sig Dispense Refill  . neomycin-bacitracin-polymyxin (NEOSPORIN) OINT Apply 1 application topically as  needed. To toe      . warfarin (COUMADIN) 5 MG tablet Take 5 mg by mouth daily.       No current facility-administered medications on file prior to visit.   No Known Allergies History   Social History  . Marital Status: Married    Spouse Name: N/A    Number of Children: N/A  . Years of Education: 12   Occupational History  .     Social History Main Topics  . Smoking status: Former Games developer  . Smokeless tobacco: None  . Alcohol Use: No  . Drug Use: No  . Sexually Active: No   Other Topics Concern  . None   Social History Narrative  . None   Family History  Problem Relation Age of Onset  . Diabetes Daughter        Review of Systems  Constitutional: Negative for fever, chills, diaphoresis, activity change, appetite change, fatigue and unexpected weight change.  Respiratory: Negative for cough and shortness of breath.   Cardiovascular: Positive for leg swelling. Negative for chest pain and palpitations.  Gastrointestinal: Positive for abdominal pain.  Musculoskeletal: Positive for myalgias, back pain, joint swelling, arthralgias and gait problem.  Skin: Positive for wound. Negative for rash.  Neurological: Positive for dizziness, facial asymmetry, speech difficulty, weakness, light-headedness and  numbness. Negative for seizures and syncope.      BP 106/76  Pulse 68  Temp(Src) 98.1 F (36.7 C) (Oral)  Resp 16  Ht 5' 6.5" (1.689 m)  Wt 124 lb 6.4 oz (56.427 kg)  BMI 19.78 kg/m2  SpO2 97% Objective:   Physical Exam  Constitutional: She appears well-developed and well-nourished. No distress.  HENT:  Head: Normocephalic and atraumatic.  Right Ear: External ear normal.  Left Ear: External ear normal.  Eyes: Conjunctivae are normal. No scleral icterus.  Neck: Normal range of motion. Neck supple. No thyromegaly present.  Cardiovascular: Normal rate, regular rhythm, normal heart sounds and intact distal pulses.   Pulmonary/Chest: Effort normal and breath sounds  normal. No respiratory distress.  Musculoskeletal: She exhibits no edema.       Left foot: She exhibits decreased range of motion, tenderness, bony tenderness, swelling and deformity.  Right first toe with serosanguinous drainage and mild tenderness along medial nail fold. No sig induration or fluctuance, no erythema or warmth.  Right heal with tenderness to palpation over direct plantar palpation  Lymphadenopathy:    She has no cervical adenopathy.  Neurological: She is alert. She displays atrophy. A sensory deficit is present. She exhibits abnormal muscle tone. Coordination and gait abnormal.  Skin: Skin is warm and dry. She is not diaphoretic. No erythema.  Psychiatric: She has a normal mood and affect. Her behavior is normal.     [facility] reading (PRIMARY) by  [name]. Right foot xray: No acute bony abnormality or bony cause of heal pain seen. Thickening of the plantar fascia noted. Assessment & Plan:  Paronychia of great toe of right foot - Plan: CBC with Differential, Ambulatory referral to Podiatry - pt wants her toenail removed but I am seeing [facility] at the [facility] where I do not have the capability of doing this. Offered pt that she could go next door to [facility] but husband declined - will try oral antibiotic, top bactroban, soapy water soaks qid , peeling back nail fold for a wk first and if getting worse or not persisting, then pt will f/u at [facility] for eval vs f/u with podiatry.  Seizure disorder - Get neurology records to confirm if the plan was to keep her on Kepra indefinitely? And to whether the seizure d/o is organic vs induced by other CVA?  HTN (hypertension) - Plan: POCT glucose (manual entry) - BP low - if persists, cons decreasing lisinopril  HLD (hyperlipidemia) - Plan: Lipid panel  History of CVA (cerebrovascular accident) - extensive disability form for insurance completed today which was difficult as this was our first visit. Copy made to scan into chart as looks like she will  need this yearly.  In summary - there is no way pt can work at any time of job due to here severe dysarthria - she can barely be understood at all and this is clearly frustrating to her - as well has her right-sided weakness which leaves her only able to ambulate a few feet and very slowly with cane at that.  Heel pain, right - Plan: DG Foot Complete Right, TSH, Ambulatory referral to Podiatry, acetaminophen-codeine (TYLENOL #3) 300-30 MG per tablet, Ambulatory referral to Physical Therapy. Try tylenol #3 for pain for short-term only.  Encounter for long-term (current) use of other medications - Plan: Comprehensive metabolic panel, POCT glucose (manual entry)   Cardiomegaly  - Plan: TSH - Have released signed so we can get cardiology records to see the plan,  if she needs another echo, etc  Plantar fasciitis of right foot - Plan: Ambulatory referral to Podiatry, Ambulatory referral to Physical Therapy - no heal spurs, i suspect this is plantar fasciitis but not sure. She likely needs new orthotics as well so will defer to PT or podiatry.  I tried to explain the stretching exercises and icing for plantar fasciitis but difficult for pt due to weakness so hopefully PT can helps.  HM - pt is long overdue for physical - not UTD on colonoscopy, pelvic, mammogram, etc - discuss at f/u  Meds ordered this encounter  Medications         . carvedilol (COREG) 6.25 MG tablet    Sig: Take 1 tablet (6.25 mg total) by mouth 2 (two) times daily with a meal.    Dispense:  180 tablet    Refill:  1  . digoxin (LANOXIN) 0.25 MG tablet    Sig: Take 1 tablet (0.25 mg total) by mouth daily.    Dispense:  90 tablet    Refill:  1  . levETIRAcetam (KEPPRA) 500 MG tablet    Sig: Take 1 tablet (500 mg total) by mouth 2 (two) times daily.    Dispense:  180 tablet    Refill:  1  . omeprazole (PRILOSEC) 20 MG capsule    Sig: Take 1 capsule (20 mg total) by mouth daily.    Dispense:  90 capsule    Refill:  1  .  lisinopril (PRINIVIL,ZESTRIL) 10 MG tablet    Sig: Take 1 tablet (10 mg total) by mouth daily.    Dispense:  90 tablet    Refill:  1  . pravastatin (PRAVACHOL) 80 MG tablet    Sig: Take 1 tablet (80 mg total) by mouth daily.    Dispense:  90 tablet    Refill:  1  . cephALEXin (KEFLEX) 500 MG capsule    Sig: Take 1 capsule (500 mg total) by mouth 4 (four) times daily.    Dispense:  28 capsule    Refill:  0  . mupirocin ointment (BACTROBAN) 2 %    Sig: Apply topically 3 (three) times daily.    Dispense:  22 g    Refill:  1  . acetaminophen-codeine (TYLENOL #3) 300-30 MG per tablet    Sig: Take 1 tablet by mouth every 4 (four) hours as needed for pain.    Dispense:  30 tablet    Refill:  0    Spent over 40 min face to face with pt and her husband reviewing her medical care and correlating information.

## 2012-04-12 LAB — TSH: TSH: 1.296 u[IU]/mL (ref 0.350–4.500)

## 2012-04-17 ENCOUNTER — Telehealth: Payer: Self-pay

## 2012-04-17 MED ORDER — LISINOPRIL 10 MG PO TABS: 10.0000 mg | ORAL_TABLET | Freq: Every day | ORAL | Status: DC

## 2012-04-17 NOTE — Telephone Encounter (Signed)
NEEDS REFILL ON LISINOPRIL 1OMG. PHARMACY SAID THAT SHE NEEDS AN OV, BUT PT WAS JUST HERE AND SAW DR Clelia Croft. CAN YOU CHECK ON THIS? THANKS      [facility] ON [address]

## 2012-04-17 NOTE — Telephone Encounter (Signed)
Was sent in 3/21 for 6 mos supply. Called her to advise. It is resent also in case they did not get this. Spoke to her husband, he is going to the pharmacy now to pick up

## 2012-05-01 ENCOUNTER — Other Ambulatory Visit: Payer: Self-pay | Admitting: Family Medicine

## 2012-05-01 ENCOUNTER — Other Ambulatory Visit: Payer: Self-pay | Admitting: Physician Assistant

## 2012-05-01 NOTE — Telephone Encounter (Signed)
LMOM on H# to CB. No VM set up on cell #.

## 2012-05-01 NOTE — Telephone Encounter (Signed)
This was just for pt to use for her foot pain until she saw the podiatrist. And I saw that she saw the podiatrist to take off her ingrown toenail but what is being done about the heal pain?? Ok to refill x 1 but this is not a chronic med - she needs to see the podiatrist for help with her heal pain.

## 2012-05-02 ENCOUNTER — Other Ambulatory Visit: Payer: Self-pay | Admitting: Radiology

## 2012-05-02 NOTE — Telephone Encounter (Signed)
Spoke w/husband and he verified that pt has been to the podiatrist and they didn't rx any other medication for her heal since we had already given her some. Advised husband to have pt f/up w/podiatrist to see what tx they suggest and they will need to manage her treatment and pain medication after this RF Dr Clelia Croft authorized. Husband agreed.

## 2012-05-06 ENCOUNTER — Encounter: Payer: Self-pay | Admitting: Radiology

## 2012-05-06 DIAGNOSIS — Z7901 Long term (current) use of anticoagulants: Secondary | ICD-10-CM

## 2012-06-10 ENCOUNTER — Other Ambulatory Visit: Payer: Self-pay

## 2012-06-10 MED ORDER — DIGOXIN 250 MCG PO TABS: 0.2500 mg | ORAL_TABLET | Freq: Every day | ORAL | Status: DC

## 2012-06-18 ENCOUNTER — Other Ambulatory Visit: Payer: Self-pay | Admitting: Family Medicine

## 2012-07-10 ENCOUNTER — Telehealth: Payer: Self-pay

## 2012-07-10 NOTE — Telephone Encounter (Signed)
Please advise [facility] that the patient was given #90 with RF 1 on 04/11/12. They should not need a RF yet.

## 2012-07-10 NOTE — Telephone Encounter (Signed)
[facility] request refill on Pravastatin 80 mg

## 2012-07-11 MED ORDER — PRAVASTATIN SODIUM 80 MG PO TABS: 80.0000 mg | ORAL_TABLET | Freq: Every day | ORAL | Status: DC

## 2012-07-11 NOTE — Telephone Encounter (Signed)
They do not have record of the refill it is resent.

## 2012-08-15 ENCOUNTER — Ambulatory Visit (INDEPENDENT_AMBULATORY_CARE_PROVIDER_SITE_OTHER): Payer: Medicare Other | Admitting: Family Medicine

## 2012-08-15 ENCOUNTER — Encounter: Payer: Self-pay | Admitting: Family Medicine

## 2012-08-15 VITALS — BP 141/69 | HR 72 | Temp 98.2°F | Resp 16

## 2012-08-15 DIAGNOSIS — Z1211 Encounter for screening for malignant neoplasm of colon: Secondary | ICD-10-CM

## 2012-08-15 DIAGNOSIS — R7309 Other abnormal glucose: Secondary | ICD-10-CM

## 2012-08-15 DIAGNOSIS — R531 Weakness: Secondary | ICD-10-CM

## 2012-08-15 DIAGNOSIS — I639 Cerebral infarction, unspecified: Secondary | ICD-10-CM

## 2012-08-15 DIAGNOSIS — G40909 Epilepsy, unspecified, not intractable, without status epilepticus: Secondary | ICD-10-CM

## 2012-08-15 DIAGNOSIS — I10 Essential (primary) hypertension: Secondary | ICD-10-CM

## 2012-08-15 DIAGNOSIS — I69359 Hemiplegia and hemiparesis following cerebral infarction affecting unspecified side: Secondary | ICD-10-CM

## 2012-08-15 DIAGNOSIS — Z1239 Encounter for other screening for malignant neoplasm of breast: Secondary | ICD-10-CM

## 2012-08-15 DIAGNOSIS — Z8673 Personal history of transient ischemic attack (TIA), and cerebral infarction without residual deficits: Secondary | ICD-10-CM

## 2012-08-15 DIAGNOSIS — Z1231 Encounter for screening mammogram for malignant neoplasm of breast: Secondary | ICD-10-CM

## 2012-08-15 DIAGNOSIS — IMO0002 Reserved for concepts with insufficient information to code with codable children: Secondary | ICD-10-CM

## 2012-08-15 DIAGNOSIS — E785 Hyperlipidemia, unspecified: Secondary | ICD-10-CM

## 2012-08-15 LAB — POCT GLYCOSYLATED HEMOGLOBIN (HGB A1C): Hemoglobin A1C: 5.3

## 2012-08-15 MED ORDER — PRAVASTATIN SODIUM 80 MG PO TABS: 80.0000 mg | ORAL_TABLET | Freq: Every day | ORAL | Status: DC

## 2012-08-15 MED ORDER — LEVETIRACETAM 500 MG PO TABS: 500.0000 mg | ORAL_TABLET | Freq: Two times a day (BID) | ORAL | Status: DC

## 2012-08-15 MED ORDER — DIGOXIN 250 MCG PO TABS: 0.2500 mg | ORAL_TABLET | Freq: Every day | ORAL | Status: DC

## 2012-08-15 MED ORDER — LISINOPRIL 10 MG PO TABS: 10.0000 mg | ORAL_TABLET | Freq: Every day | ORAL | Status: DC

## 2012-08-15 MED ORDER — CARVEDILOL 6.25 MG PO TABS: 6.2500 mg | ORAL_TABLET | Freq: Two times a day (BID) | ORAL | Status: DC

## 2012-08-15 NOTE — Patient Instructions (Addendum)
I am going to refer you to the [employer] - they try to make medical care easier by coordinating a team of nurses, social workers, physical therapists, and physicians (like me and the other doctors here at [facility]) to work with you and make sure that you have the access to community resources that they need.  You should be contacted soon by a case worker or nurse from [facility] - if you don't here from them in 1-2 wks, let us know so we can follow-up and figure out what happened to the referral.  Try going off the omeprazole. If you have more indigestion or heartburn, you can always restart it but you might not need it every day.  In 4 months we will see you for a complete medicare physical along with a pelvic exam at that visit.  We will refer you to follow-up on your mammogram and colonoscopy today.  I recommend that you be FASTING at your next OV in 4 months for routine labs.  Consider checking out the pricing at [facility] - a pharmacy in [place] that has free mail-order shipping - they generally have the best prices around and really don't mark things up as much as most other pharmacies.  [phone]  M-F 9am-6pm & Sat 9am-12pm.  Closed Sunday

## 2012-08-15 NOTE — Progress Notes (Signed)
Subjective:    Patient ID: [name], female    DOB: 24-Mar-1950, 62 y.o.   MRN: [id number] Chief Complaint  Patient presents with  . med review    HPI  [name] is doing well.  She saw the podiatrist who helped with her toenail and she is no longer c/o heal pain.  Her husband has noticed that she is having swelling over her right cheek for the past several weeks, non focal. She has not complained about it though in office states that both cheeks are painful. No rhinitis, sneezing, no changes in skin with redness, warmth, or tenderness. No falls or injuries to area.  They are trying to determine what pharmacy would be cheapest to get her medications at so they have queried several and will start at [facility] for now.  Ran out of lisinopril and pravastatin sev wks ago and never got refills (looks like we may have [facility] when [facility] uses [facility]).  Husband is providing all of pt's home care.  She is having more right hand contractures and weakness (used to be right-dominant).   Past Medical History  Diagnosis Date  . CVA (cerebral infarction)   . Hypertension   . Seizures   . Stroke    Current Outpatient Prescriptions on File Prior to Visit  Medication Sig Dispense Refill  . omeprazole (PRILOSEC) 20 MG capsule Take 1 capsule (20 mg total) by mouth daily.  90 capsule  1  . warfarin (COUMADIN) 5 MG tablet Take 5 mg by mouth daily.       No current facility-administered medications on file prior to visit.   No Known Allergies   Review of Systems  Constitutional: Negative for activity change and appetite change.  HENT: Positive for facial swelling and trouble swallowing. Negative for ear pain, nosebleeds, congestion, rhinorrhea, sneezing, voice change and sinus pressure.   Gastrointestinal: Negative for nausea, abdominal pain and abdominal distention.  Musculoskeletal: Positive for gait problem. Negative for joint swelling and arthralgias.  Skin: Negative for color change,  pallor, rash and wound.  Neurological: Positive for speech difficulty and weakness. Negative for seizures and syncope.  Hematological: Negative for adenopathy. Bruises/bleeds easily.      BP 141/69  Pulse 72  Temp(Src) 98.2 F (36.8 C)  Resp 16 Objective:   Physical Exam  Constitutional: She appears well-developed and well-nourished. No distress.  HENT:  Head: Normocephalic and atraumatic. Head is without raccoon's eyes, without abrasion, without contusion, without right periorbital erythema and without left periorbital erythema.  Right Ear: Tympanic membrane, external ear and ear canal normal.  Left Ear: Tympanic membrane, external ear and ear canal normal.  Nose: No mucosal edema or rhinorrhea. Right sinus exhibits maxillary sinus tenderness. Left sinus exhibits maxillary sinus tenderness.  Mouth/Throat: Uvula is midline, oropharynx is clear and moist and mucous membranes are normal. Abnormal dentition.  Eyes: Conjunctivae are normal. No scleral icterus.  Neck: Normal range of motion. Neck supple. No thyromegaly present.  Cardiovascular: Normal rate, regular rhythm, normal heart sounds and intact distal pulses.   Pulmonary/Chest: Effort normal and breath sounds normal. No respiratory distress.  Musculoskeletal: She exhibits no edema.  Lymphadenopathy:    She has no cervical adenopathy.  Neurological: She is alert. She displays atrophy. She exhibits abnormal muscle tone. Coordination and gait abnormal.  Skin: Skin is warm and dry. She is not diaphoretic. No erythema.      Results for orders placed in visit on 08/15/12  GLUCOSE, POCT (MANUAL RESULT ENTRY)  Result Value Range   POC Glucose 94  70 - 99 mg/dl  POCT GLYCOSYLATED HEMOGLOBIN (HGB A1C)      Result Value Range   Hemoglobin A1C 5.3      Assessment & Plan:  Elevated glucose - Plan: POCT glucose (manual entry), POCT glycosylated hemoglobin (Hb A1C), CANCELED: POCT A1C  Weakness - Plan: Ambulatory referral to Home  Health  Seizure disorder - Pt has seizure d/o PRIOR to CVA, started in 2007, so likely did need life long management on Keppra by neuro note in 2009.  HTN (hypertension) - elev today. restart lisinopril - I suspect this was called to the wrong pharmacy.  HLD (hyperlipidemia) - restart statin.  History of CVA (cerebrovascular accident) in 2009- Plan: Ambulatory referral to Home Health - pt severely disabled and as she is so young, she could really benefit from additional community resources - perhaps even an adult day program?  Will refer to [facility] case management to try to coordinate medicare resources and home care.  (L) MCA cardio embolic stroke   Dysarthria due to cerebrovascular accident - Plan: Ambulatory referral to Home Health  Hemiparesis affecting dominant side as late effect of cerebrovascular accident - Plan: Ambulatory referral to Home Health  HM - referred for mammogram- none prior in chart though noted on flow sheet to have been done in 2001.  Does not need pap as had hysterectomy for benign reasons. Colonoscopy marked on flow sheet to have been done in 1998 but did not know where or results - no records in chart so will refer to GI for screening colonoscopy.  Has been on protonix for years - try d/c - can always restart if necessary.  Meds ordered this encounter  Medications  . carvedilol (COREG) 6.25 MG tablet    Sig: Take 1 tablet (6.25 mg total) by mouth 2 (two) times daily with a meal.    Dispense:  180 tablet    Refill:  1  . digoxin (LANOXIN) 0.25 MG tablet    Sig: Take 1 tablet (0.25 mg total) by mouth daily.    Dispense:  90 tablet    Refill:  1  . levETIRAcetam (KEPPRA) 500 MG tablet    Sig: Take 1 tablet (500 mg total) by mouth 2 (two) times daily.    Dispense:  180 tablet    Refill:  1  . lisinopril (PRINIVIL,ZESTRIL) 10 MG tablet    Sig: Take 1 tablet (10 mg total) by mouth daily.    Dispense:  90 tablet    Refill:  1  . pravastatin (PRAVACHOL) 80 MG  tablet    Sig: Take 1 tablet (80 mg total) by mouth daily.    Dispense:  90 tablet    Refill:  1

## 2012-08-26 ENCOUNTER — Telehealth: Payer: Self-pay

## 2012-08-26 DIAGNOSIS — G8190 Hemiplegia, unspecified affecting unspecified side: Secondary | ICD-10-CM

## 2012-08-26 DIAGNOSIS — Z8673 Personal history of transient ischemic attack (TIA), and cerebral infarction without residual deficits: Secondary | ICD-10-CM

## 2012-08-26 DIAGNOSIS — R531 Weakness: Secondary | ICD-10-CM

## 2012-08-26 NOTE — Telephone Encounter (Signed)
ALLISON FROM [facility] SINCE PT ISN'T HOMEBOUND SHE WOULD LIKE TO REFER HER TO OUT PATIENT THERAPY AND HER FAMILY IS OK WITH IT PLEASE CALL [phone]

## 2012-08-26 NOTE — Telephone Encounter (Signed)
Please advise, pended order for outpatient PT

## 2012-08-28 NOTE — Telephone Encounter (Signed)
Perfect, thanks 

## 2012-09-08 ENCOUNTER — Ambulatory Visit
Admission: RE | Admit: 2012-09-08 | Discharge: 2012-09-08 | Disposition: A | Discharge status: Home / Self Care | Payer: Medicare Other | Source: Ambulatory Visit | Attending: Family Medicine | Admitting: Family Medicine

## 2012-09-08 DIAGNOSIS — Z1231 Encounter for screening mammogram for malignant neoplasm of breast: Secondary | ICD-10-CM

## 2012-09-19 ENCOUNTER — Encounter: Payer: Self-pay | Admitting: Nurse Practitioner

## 2012-09-22 DIAGNOSIS — D126 Benign neoplasm of colon, unspecified: Secondary | ICD-10-CM

## 2012-09-22 HISTORY — DX: Benign neoplasm of colon, unspecified: D12.6

## 2012-09-24 ENCOUNTER — Encounter: Payer: Self-pay | Admitting: *Deleted

## 2012-09-25 ENCOUNTER — Encounter: Payer: Self-pay | Admitting: Nurse Practitioner

## 2012-09-25 ENCOUNTER — Ambulatory Visit (INDEPENDENT_AMBULATORY_CARE_PROVIDER_SITE_OTHER): Payer: Medicare Other | Admitting: Nurse Practitioner

## 2012-09-25 ENCOUNTER — Telehealth: Payer: Self-pay | Admitting: Radiology

## 2012-09-25 VITALS — BP 100/64 | HR 56 | Ht 66.5 in | Wt 127.0 lb

## 2012-09-25 DIAGNOSIS — Z1211 Encounter for screening for malignant neoplasm of colon: Secondary | ICD-10-CM

## 2012-09-25 DIAGNOSIS — Z7901 Long term (current) use of anticoagulants: Secondary | ICD-10-CM

## 2012-09-25 DIAGNOSIS — Z5181 Encounter for therapeutic drug level monitoring: Secondary | ICD-10-CM

## 2012-09-25 NOTE — Telephone Encounter (Signed)
Spoke to [employer] at [place], she is asking why we are seeing patient for primary care but not managing coumadin. I advised we do not have the equipment or the lab capability to dose /manage coumadin here, we send patient to coumadin clinics for this. She is advised we can manage after acute DVT/ or PE, but we then send to the coumadin clinics/ to you [facility]

## 2012-09-25 NOTE — Progress Notes (Signed)
HPI :  Patient is a 62 year old female, new to this practice, here to be evaluated for any colon cancer screening. She is on chronic Coumadin for history of cardioembolic CVA which has left her with right hemiparesis and speech difficulties.  residual right hemiparesis and speech problems. Patient had problems with dysphagia post CVA, apparently had a PEG placed. After working with SLP swallowing improved she can eat anything now per husband. Patient has never had a colonoscopy. She has no gastrointestinal complaints such as abdominal pain, bowel changes, blood in stool, weight loss. Her grandfather had colon cancer.  Past Medical History  Diagnosis Date  . CVA (cerebral infarction)   . Hypertension   . Seizures   . Hyperlipidemia   . Hemiparesis   . CHF (congestive heart failure)     EF 15-20% as of 05/28/11 (Dr [facility] summary)    Family History  Problem Relation Age of Onset  . Diabetes Daughter   . Colon cancer Maternal Grandfather    History  Substance Use Topics  . Smoking status: Former Smoker    Types: Cigarettes    Quit date: 09/26/2007  . Smokeless tobacco: Never Used  . Alcohol Use: No   Current Outpatient Prescriptions  Medication Sig Dispense Refill  . carvedilol (COREG) 6.25 MG tablet Take 1 tablet (6.25 mg total) by mouth 2 (two) times daily with a meal.  180 tablet  1  . digoxin (LANOXIN) 0.25 MG tablet Take 1 tablet (0.25 mg total) by mouth daily.  90 tablet  1  . levETIRAcetam (KEPPRA) 500 MG tablet Take 1 tablet (500 mg total) by mouth 2 (two) times daily.  180 tablet  1  . lisinopril (PRINIVIL,ZESTRIL) 10 MG tablet Take 1 tablet (10 mg total) by mouth daily.  90 tablet  1  . omeprazole (PRILOSEC) 20 MG capsule Take 1 capsule (20 mg total) by mouth daily.  90 capsule  1  . pravastatin (PRAVACHOL) 80 MG tablet Take 1 tablet (80 mg total) by mouth daily.  90 tablet  1  . warfarin (COUMADIN) 5 MG tablet Take 5 mg by mouth daily.       No current  facility-administered medications for this visit.   No Known Allergies   Review of Systems: Positive for swelling of feet / legs. All other reviewed and negative except where noted in HPI.   Physical Exam: BP 100/64  Pulse 56  Ht 5' 6.5" (1.689 m)  Wt 127 lb (57.607 kg)  BMI 20.19 kg/m2 Constitutional: Pleasant,well-developed, black female in no acute distress. HEENT: Normocephalic and atraumatic. Conjunctivae are normal. No scleral icterus. Neck supple.  Cardiovascular: Normal rate, regular rhythm. Murmur heard Pulmonary/chest: Effort normal and breath sounds normal. No wheezing, rales or rhonchi. Abdominal: Soft, nondistended, nontender. Bowel sounds active throughout. There are no masses palpable. No hepatomegaly. Extremities: no edema. Right hemiparesis.  Lymphadenopathy: No cervical adenopathy noted. Neurological: Alert and oriented. Right hemiparesis, speech difficulties.  Skin: Skin is warm and dry. No rashes noted. Psychiatric:pleasant, cooperative  ASSESSMENT AND PLAN: 71. 62 year old female here for colorectal cancer screening. Patient is at increased risk for procedures as she is on chronic coumadin for history of a cardioembolic stroke in 2009. Patient has been tentatively scheduled for colonoscopy with Dr. Jarold Motto. The risks, benefits, and alternatives to colonoscopy with possible biopsy and possible polypectomy were discussed with the patient and she consents to proceed. Will contact Dr. Jacky Kindle about holding coumadin for the procedure. If coumadin cannot be safely held then  we will need to entertain other options (virtual colonoscopy vrs not pursuing screening colonoscopy). Of note, patient has no GI symptoms, hgb in March of this year was normal at 12.7. .   2. Seizure disorder, on Keppra  3. Cardioembolic stroke 2009 with residual right hemiparesis and speech difficulties. On chronic coumadin managed by Dr. Jacky Kindle though patient's PCP is at [facility].   4. History of  congestive heart failure. Her ejection fraction was 5-20% in 2009. Patient didn't know which cardiologist treated her in the past but we did finally determine that she has been seen by Dr. Jacinto Halim. We called his office, a repeat echo last year showed EF had significantly improved to 50%. Cardiology didn't feel need for continued follow up.   5. HTN / hyperlipidemia

## 2012-09-25 NOTE — Telephone Encounter (Signed)
Agree and thanks

## 2012-09-25 NOTE — Patient Instructions (Addendum)
You have been scheduled for a colonoscopy with propofol. Please follow written instructions given to you at your visit today.  Please pick up your prep kit at the pharmacy within the next 1-3 days. If you use inhalers (even only as needed), please bring them with you on the day of your procedure. Your physician has requested that you go to www.startemmi.com and enter the access code given to you at your visit today. This web site gives a general overview about your procedure. However, you should still follow specific instructions given to you by our office regarding your preparation for the procedure.  You will be contaced by our office prior to your procedure for directions on holding your Coumadin/Warfarin.  If you do not hear from our office 1 week prior to your scheduled procedure, please call [phone] to discuss.  CC: Dr Jacinto Halim, Dr Jacky Kindle, Dr Norberto Sorenson

## 2012-10-02 ENCOUNTER — Telehealth: Payer: Self-pay | Admitting: Radiology

## 2012-10-02 ENCOUNTER — Telehealth: Payer: Self-pay | Admitting: Gastroenterology

## 2012-10-02 NOTE — Telephone Encounter (Signed)
[name] states that she spoken with Dr Jacky Kindle (dr rx'ing coumadin) and Wynne Dust (who manages coumadin) and they are unwilling to advise on whether patient can hold coumadin for procedure. They would like Dr Norberto Sorenson (at [facility]) to decide. I have contacted Dr Alver Fisher office and they will ask for Dr Alver Fisher advice.

## 2012-10-02 NOTE — Telephone Encounter (Signed)
Patients PCP is Dr Clelia Croft, Dr Jacky Kindle controls her Coumadin, but will not advise if patient will be able to discontinue the coumadin for a procedure, will you advise if she can d/c coumadin for 5 days for her colonoscopy, call Dottie [phone]. Please advise, and I will call Dottie please advise.

## 2012-10-08 ENCOUNTER — Telehealth: Payer: Self-pay | Admitting: Gastroenterology

## 2012-10-08 MED ORDER — MOVIPREP 100 G PO SOLR: 1.0000 | Freq: Once | ORAL | Status: DC

## 2012-10-08 NOTE — Telephone Encounter (Signed)
I have spoken to patient's caregiver/spouse, [name] and have advised him that per Dr Clelia Croft, patient may hold her warfarin 5 days prior to the procedure and resume day of procedure (provided our dr does not instruct otherwise due to biopsies etc). I have also advised that patient will need PT/INR rechecked 2 weeks after procedure. Patient's husband verbalizes understanding and has repeated this information back to me. He also states that the patient has an appointment with Wynne Dust [facility]) on 10/15/12 at which time they will discuss. I will also send this correspondence to Ms. Miller for her records.

## 2012-10-08 NOTE — Telephone Encounter (Signed)
Yes, that if fine to stop coumadin 5d before the procedure. Restart coumadin the day of the procedure and have INR checked in 2 weeks after the procedure.

## 2012-10-08 NOTE — Telephone Encounter (Signed)
rx sent

## 2012-10-08 NOTE — Telephone Encounter (Signed)
Called Dottie at [employer]. LMOM letting her know pt can stop coumadin 5 days prior to procedure.

## 2012-10-21 ENCOUNTER — Encounter: Payer: Self-pay | Admitting: Gastroenterology

## 2012-10-21 ENCOUNTER — Ambulatory Visit (HOSPITAL_COMMUNITY): Admit: 2012-10-21 | Payer: Self-pay | Admitting: Internal Medicine

## 2012-10-21 ENCOUNTER — Encounter (HOSPITAL_COMMUNITY): Payer: Self-pay

## 2012-10-21 ENCOUNTER — Ambulatory Visit (AMBULATORY_SURGERY_CENTER): Payer: Medicare Other | Admitting: Gastroenterology

## 2012-10-21 VITALS — BP 140/74 | HR 49 | Temp 97.5°F | Resp 31 | Ht 66.0 in | Wt 127.0 lb

## 2012-10-21 DIAGNOSIS — Z1211 Encounter for screening for malignant neoplasm of colon: Secondary | ICD-10-CM

## 2012-10-21 DIAGNOSIS — D126 Benign neoplasm of colon, unspecified: Secondary | ICD-10-CM

## 2012-10-21 SURGERY — COLONOSCOPY WITH PROPOFOL
Anesthesia: Monitor Anesthesia Care

## 2012-10-21 MED ORDER — SODIUM CHLORIDE 0.9 % IV SOLN: 500.0000 mL | INTRAVENOUS | Status: DC

## 2012-10-21 NOTE — Progress Notes (Signed)
Patient did not experience any of the following events: a burn prior to discharge; a fall within the facility; wrong site/side/patient/procedure/implant event; or a hospital transfer or hospital admission upon discharge from the facility. (G8907) Patient did not have preoperative order for IV antibiotic SSI prophylaxis. (G8918)  

## 2012-10-21 NOTE — Patient Instructions (Addendum)
Discharge instructions given with verbal understanding. Handout on polyps given. Resume previous medications. YOU HAD AN ENDOSCOPIC PROCEDURE TODAY AT THE [facility]: Refer to the procedure report that was given to you for any specific questions about what was found during the examination.  If the procedure report does not answer your questions, please call your gastroenterologist to clarify.  If you requested that your care partner not be given the details of your procedure findings, then the procedure report has been included in a sealed envelope for you to review at your convenience later.  YOU SHOULD EXPECT: Some feelings of bloating in the abdomen. Passage of more gas than usual.  Walking can help get rid of the air that was put into your GI tract during the procedure and reduce the bloating. If you had a lower endoscopy (such as a colonoscopy or flexible sigmoidoscopy) you may notice spotting of blood in your stool or on the toilet paper. If you underwent a bowel prep for your procedure, then you may not have a normal bowel movement for a few days.  DIET: Your first meal following the procedure should be a light meal and then it is ok to progress to your normal diet.  A half-sandwich or bowl of soup is an example of a good first meal.  Heavy or fried foods are harder to digest and may make you feel nauseous or bloated.  Likewise meals heavy in dairy and vegetables can cause extra gas to form and this can also increase the bloating.  Drink plenty of fluids but you should avoid alcoholic beverages for 24 hours.  ACTIVITY: Your care partner should take you home directly after the procedure.  You should plan to take it easy, moving slowly for the rest of the day.  You can resume normal activity the day after the procedure however you should NOT DRIVE or use heavy machinery for 24 hours (because of the sedation medicines used during the test).    SYMPTOMS TO REPORT IMMEDIATELY: A  gastroenterologist can be reached at any hour.  During normal business hours, 8:30 AM to 5:00 PM Monday through Friday, call [phone].  After hours and on weekends, please call the GI answering service at [phone] who will take a message and have the physician on call contact you.   Following lower endoscopy (colonoscopy or flexible sigmoidoscopy):  Excessive amounts of blood in the stool  Significant tenderness or worsening of abdominal pains  Swelling of the abdomen that is new, acute  Fever of 100F or higher  FOLLOW UP: If any biopsies were taken you will be contacted by phone or by letter within the next 1-3 weeks.  Call your gastroenterologist if you have not heard about the biopsies in 3 weeks.  Our staff will call the home number listed on your records the next business day following your procedure to check on you and address any questions or concerns that you may have at that time regarding the information given to you following your procedure. This is a courtesy call and so if there is no answer at the home number and we have not heard from you through the emergency physician on call, we will assume that you have returned to your regular daily activities without incident.  SIGNATURES/CONFIDENTIALITY: You and/or your care partner have signed paperwork which will be entered into your electronic medical record.  These signatures attest to the fact that that the information above on your After Visit Summary has   been reviewed and is understood.  Full responsibility of the confidentiality of this discharge information lies with you and/or your care-partner. 

## 2012-10-21 NOTE — Progress Notes (Signed)
Report to pacu rn, vss, bbs=clear, is a paraplegic, more sever on right side, from CVA, . Is Dysphsic. Alert as per preop, x3, ansewers questions...comfortable.

## 2012-10-21 NOTE — Progress Notes (Signed)
Called to room to assist during endoscopic procedure.  Patient ID and intended procedure confirmed with present staff. Received instructions for my participation in the procedure from the performing physician.  

## 2012-10-21 NOTE — Op Note (Signed)
[facility] [address] [place], [address]   COLONOSCOPY PROCEDURE REPORT  PATIENT: [name], Valerie M.  MR#: [id number] BIRTHDATE: 07/12/1950 , 61  yrs. old GENDER: Female ENDOSCOPIST: Mardella Layman, MD, [facility] REFERRED AO:[id number] Jacky Kindle, M.D. PROCEDURE DATE:  10/21/2012 PROCEDURE:   Colonoscopy with snare polypectomy Prior Negative Screening - Now for repeat screening.  N/A History of Adenoma - Now for follow-up colonoscopy & has been > or = to 3 yrs.  N/A  Polyps Removed Today? Yes. ASA CLASS:   Class III INDICATIONS:average risk screening. MEDICATIONS: Propofol (Diprivan) 130 mg IV  DESCRIPTION OF PROCEDURE:   After the risks benefits and alternatives of the procedure were thoroughly explained, informed consent was obtained.  A digital rectal exam revealed no abnormalities of the rectum.   The LB [id number]  endoscope was introduced through the anus and advanced to the cecum, which was identified by both the appendix and ileocecal valve. No adverse events experienced.   The quality of the prep was good, using MoviPrep  The instrument was then slowly withdrawn as the colon was fully examined.      COLON FINDINGS: A smooth sessile polyp ranging between 3-85mm in size was found in the transverse colon.  A polypectomy was performed with a cold snare.  The resection was complete and the polyp tissue was completely retrieved.   The colon was otherwise normal.  There was no diverticulosis, inflammation, polyps or cancers unless previously stated.  Retroflexed views revealed no abnormalities. The time to cecum=4 minutes 37 seconds.  Withdrawal time=8 minutes 01 seconds.  The scope was withdrawn and the procedure completed. COMPLICATIONS: There were no complications.  ENDOSCOPIC IMPRESSION: 1.   Sessile polyp ranging between 3-65mm in size was found in the transverse colon; polypectomy was performed with a cold snare 2.   The colon was otherwise  normal  RECOMMENDATIONS: 1.  Repeat colonoscopy in 5 years if polyp adenomatous; otherwise 10 years 2.  Continue current medications 3.  Resume Coumadin (warfarin) today and have your PT/INR checked within 1 week.   eSigned:  Mardella Layman, MD, [facility] 10/21/2012 2:36 PM   cc: Willette Cluster ACNP-BC

## 2012-10-22 ENCOUNTER — Telehealth: Payer: Self-pay | Admitting: *Deleted

## 2012-10-22 NOTE — Telephone Encounter (Signed)
No answer, message left for the patient. 

## 2012-10-27 ENCOUNTER — Encounter: Payer: Self-pay | Admitting: Gastroenterology

## 2012-11-05 LAB — POCT INR: INR: 1.7 — AB (ref <=1.1)

## 2012-12-03 ENCOUNTER — Encounter: Payer: Self-pay | Admitting: Family Medicine

## 2012-12-12 ENCOUNTER — Encounter: Payer: Self-pay | Admitting: Family Medicine

## 2012-12-12 ENCOUNTER — Ambulatory Visit (INDEPENDENT_AMBULATORY_CARE_PROVIDER_SITE_OTHER): Payer: Medicare Other | Admitting: Family Medicine

## 2012-12-12 VITALS — BP 132/67 | HR 64 | Temp 98.0°F | Resp 16 | Ht 66.0 in | Wt 126.0 lb

## 2012-12-12 DIAGNOSIS — M2450 Contracture, unspecified joint: Secondary | ICD-10-CM

## 2012-12-12 DIAGNOSIS — E559 Vitamin D deficiency, unspecified: Secondary | ICD-10-CM

## 2012-12-12 DIAGNOSIS — R2681 Unsteadiness on feet: Secondary | ICD-10-CM | POA: Insufficient documentation

## 2012-12-12 DIAGNOSIS — G40909 Epilepsy, unspecified, not intractable, without status epilepticus: Secondary | ICD-10-CM

## 2012-12-12 DIAGNOSIS — Z79899 Other long term (current) drug therapy: Secondary | ICD-10-CM

## 2012-12-12 DIAGNOSIS — Z23 Encounter for immunization: Secondary | ICD-10-CM

## 2012-12-12 DIAGNOSIS — I69959 Hemiplegia and hemiparesis following unspecified cerebrovascular disease affecting unspecified side: Secondary | ICD-10-CM

## 2012-12-12 DIAGNOSIS — E785 Hyperlipidemia, unspecified: Secondary | ICD-10-CM

## 2012-12-12 DIAGNOSIS — IMO0002 Reserved for concepts with insufficient information to code with codable children: Secondary | ICD-10-CM

## 2012-12-12 DIAGNOSIS — I10 Essential (primary) hypertension: Secondary | ICD-10-CM

## 2012-12-12 DIAGNOSIS — I69322 Dysarthria following cerebral infarction: Secondary | ICD-10-CM | POA: Insufficient documentation

## 2012-12-12 DIAGNOSIS — Z0000 Encounter for general adult medical examination without abnormal findings: Secondary | ICD-10-CM

## 2012-12-12 DIAGNOSIS — R531 Weakness: Secondary | ICD-10-CM

## 2012-12-12 DIAGNOSIS — Z8673 Personal history of transient ischemic attack (TIA), and cerebral infarction without residual deficits: Secondary | ICD-10-CM

## 2012-12-12 DIAGNOSIS — I69922 Dysarthria following unspecified cerebrovascular disease: Secondary | ICD-10-CM

## 2012-12-12 LAB — CBC WITH DIFFERENTIAL/PLATELET
Basophils Absolute: 0 10*3/uL (ref 0.0–0.1)
Basophils Relative: 1 % (ref 0–1)
Eosinophils Relative: 1 % (ref 0–5)
HCT: 38.1 % (ref 36.0–46.0)
Hemoglobin: 13.3 g/dL (ref 12.0–15.0)
Lymphocytes Relative: 35 % (ref 12–46)
Lymphs Abs: 2.9 10*3/uL (ref 0.7–4.0)
MCV: 82.8 fL (ref 78.0–100.0)
Monocytes Absolute: 0.6 10*3/uL (ref 0.1–1.0)
Monocytes Relative: 7 % (ref 3–12)
Neutro Abs: 4.7 10*3/uL (ref 1.7–7.7)
RBC: 4.6 MIL/uL (ref 3.87–5.11)
RDW: 14.8 % (ref 11.5–15.5)
WBC: 8.2 10*3/uL (ref 4.0–10.5)

## 2012-12-12 LAB — COMPREHENSIVE METABOLIC PANEL
AST: 21 U/L (ref 0–37)
Albumin: 4.8 g/dL (ref 3.5–5.2)
Alkaline Phosphatase: 110 U/L (ref 39–117)
BUN: 20 mg/dL (ref 6–23)
CO2: 28 mEq/L (ref 19–32)
Calcium: 9.5 mg/dL (ref 8.4–10.5)
Chloride: 104 mEq/L (ref 96–112)
Glucose, Bld: 84 mg/dL (ref 70–99)
Potassium: 4.6 mEq/L (ref 3.5–5.3)
Total Bilirubin: 0.4 mg/dL (ref 0.3–1.2)

## 2012-12-12 LAB — LIPID PANEL
HDL: 38 mg/dL — ABNORMAL LOW (ref >39)
LDL Cholesterol: 96 mg/dL (ref 0–99)
VLDL: 32 mg/dL (ref 0–40)

## 2012-12-12 MED ORDER — ZOSTER VACCINE LIVE 19400 UNT/0.65ML ~~LOC~~ SOLR: 0.6500 mL | Freq: Once | SUBCUTANEOUS | Status: DC

## 2012-12-12 MED ORDER — BACLOFEN 20 MG PO TABS: 20.0000 mg | ORAL_TABLET | Freq: Three times a day (TID) | ORAL | Status: DC | PRN

## 2012-12-12 MED ORDER — LEVETIRACETAM 500 MG PO TABS: 500.0000 mg | ORAL_TABLET | Freq: Two times a day (BID) | ORAL | Status: DC

## 2012-12-12 MED ORDER — CARVEDILOL 6.25 MG PO TABS: 6.2500 mg | ORAL_TABLET | Freq: Two times a day (BID) | ORAL | Status: DC

## 2012-12-12 MED ORDER — LISINOPRIL 10 MG PO TABS: 10.0000 mg | ORAL_TABLET | Freq: Every day | ORAL | Status: DC

## 2012-12-12 MED ORDER — PRAVASTATIN SODIUM 80 MG PO TABS: 80.0000 mg | ORAL_TABLET | Freq: Every day | ORAL | Status: DC

## 2012-12-12 MED ORDER — DIGOXIN 250 MCG PO TABS: 0.2500 mg | ORAL_TABLET | Freq: Every day | ORAL | Status: DC

## 2012-12-12 MED ORDER — OMEPRAZOLE 20 MG PO CPDR: 20.0000 mg | DELAYED_RELEASE_CAPSULE | Freq: Every day | ORAL | Status: DC

## 2012-12-12 NOTE — Patient Instructions (Signed)
Keeping You Healthy  Get These Tests  Blood Pressure- Have your blood pressure checked by your healthcare provider at least once a year.  Normal blood pressure is 120/80.  Weight- Have your body mass index (BMI) calculated to screen for obesity.  BMI is a measure of body fat based on height and weight.  You can calculate your own BMI at www.nhlbisupport.com/bmi/  Cholesterol- Have your cholesterol checked every year.  Diabetes- Have your blood sugar checked every year if you have high blood pressure, high cholesterol, a family history of diabetes or if you are overweight.  Pap Smear- Have a pap smear every 1 to 3 years if you have been sexually active.  If you are older than 65 and recent pap smears have been normal you may not need additional pap smears.  In addition, if you have had a hysterectomy  For benign disease additional pap smears are not necessary.  Mammogram-Yearly mammograms are essential for early detection of breast cancer  Screening for Colon Cancer- Colonoscopy starting at age 50. Screening may begin sooner depending on your family history and other health conditions.  Follow up colonoscopy as directed by your Gastroenterologist.  Screening for Osteoporosis- Screening begins at age 65 with bone density scanning, sooner if you are at higher risk for developing Osteoporosis.  Get these medicines  Calcium with Vitamin D- Your body requires 1200-1500 mg of Calcium a day and 800-1000 IU of Vitamin D a day.  You can only absorb 500 mg of Calcium at a time therefore Calcium must be taken in 2 or 3 separate doses throughout the day.  Hormones- Hormone therapy has been associated with increased risk for certain cancers and heart disease.  Talk to your healthcare provider about if you need relief from menopausal symptoms.  Aspirin- Ask your healthcare provider about taking Aspirin to prevent Heart Disease and Stroke.  Get these Immuniztions  Flu shot- Every fall  Pneumonia  shot- Once after the age of 65; if you are younger ask your healthcare provider if you need a pneumonia shot.  Tetanus- Every ten years.  Zostavax- Once after the age of 60 to prevent shingles.  Take these steps  Don't smoke- Your healthcare provider can help you quit. For tips on how to quit, ask your healthcare provider or go to www.smokefree.gov or call 1-800 QUIT-NOW.  Be physically active- Exercise 5 days a week for a minimum of 30 minutes.  If you are not already physically active, start slow and gradually work up to 30 minutes of moderate physical activity.  Try walking, dancing, bike riding, swimming, etc.  Eat a healthy diet- Eat a variety of healthy foods such as fruits, vegetables, whole grains, low fat milk, low fat cheeses, yogurt, lean meats, chicken, fish, eggs, dried beans, tofu, etc.  For more information go to www.thenutritionsource.org  Dental visit- Brush and floss teeth twice daily; visit your dentist twice a year.  Eye exam- Visit your Optometrist or Ophthalmologist yearly.  Drink alcohol in moderation- Limit alcohol intake to one drink or less a day.  Never drink and drive.  Depression- Your emotional health is as important as your physical health.  If you're feeling down or losing interest in things you normally enjoy, please talk to your healthcare provider.  Seat Belts- can save your life; always wear one  Smoke/Carbon Monoxide detectors- These detectors need to be installed on the appropriate level of your home.  Replace batteries at least once a year.  Violence- If anyone   is threatening or hurting you, please tell your healthcare provider.  Living Will/ Health care power of attorney- Discuss with your healthcare provider and family. Women and Heart Disease Heart disease (HD) risk factors for both men and women are similar. Most studies addressing the diagnosis and treatment of heart disease have focused primarily on men. More research is now being done on  women and heart disease.  GENDER DIFFERENCES Symptoms of a heart attack for women may be more subtle, less typical and harder to identify. Women are often slow to recognize heart disease risk factors and symptoms. More women than men die from a heart attack before reaching a hospital. Results of medical tests can vary by gender, especially electrocardiogram stress testing. A common perception is that men are more likely to have heart problems. This is not true, especially in women after menopause. Effects of estrogen, birth control and hormone therapy can have unique effects on the heart. Women are more likely to be referred for a mental health evaluation of their symptoms. CAUSES Heart disease may be caused from conditions such as: Buildup of fat-like deposits (plaques) in the blood vessels (coronary arteries) of the heart. The build up of fat-like deposits cause blockages that decrease the blood flow to the heart muscle. Blockage or narrowing of the coronary arteries decreases oxygen to the heart muscle. Abnormal heart rhythms or problems with the electrical system of the heart. Heart muscle that has become enlarged or weak and cannot pump well. Abnormal heart valves that either leak or are thickened and do not open and close properly. Damage from infection or drugs. Heart problems present at birth. RISK FACTORS Family history. Elevated blood lipid levels (cholesterol). High blood pressure. Diabetes. Smoking. Inactivity, lack of exercise. Weighing 30% more than your ideal weight. Age. Past history of heart problems. SYMPTOMS Chest discomfort or pressure, which may include the following: Discomfort, fullness, tightness, squeezing in center of chest that stays for a few minutes or comes and goes. Discomfort or pressure that spreads to upper back, shoulders, neck, jaw or stomach. Discomfort or tingling in the arms. Profuse or clammy sweating. Difficulty breathing. Nausea. Feeling  your heart "flutter" or "jump." Unexplained feelings of anxiety, fatigue or weakness. Dizziness. DIAGNOSIS Diagnosis may include a test that: Records the electrical activity of the heart and looks for changes (EKG [electrocardiogram]) . Detects the presence of special proteins and enzymes that may show damage to the heart muscle (blood tests). Looks at the blood flow through the heart and coronary arteries by using special dyes and X-rays (coronary angiography). Uses sound waves to examine your heart valves, muscle function and blood flow within the heart (echo or echocardiogram). Looks for symptoms as your heart works harder under stress (stress tests). Creates images of the heart by detecting radiation following administration of a radioactive tracer (nuclear imaging). Records the electrical activity of the heart and helps in detecting abnormalities of heart rhythm (electrophysiology). Creates an image of the anatomy of the heart (CT heart scan). TREATMENT  Medications may be used to control your blood pressure, keep your heart beating regularly, reduce pain, and help your breathing. If you are admitted to the hospital, the length of your stay depends on the amount of heart damage and any complications you may have. Severe heart problems may require open heart surgery. This is a procedure where blocked coronary arteries are bypassed with a vein from your leg. If you have a single small coronary artery blockage and no heart damage, you may  have a balloon angioplasty. This procedure uses stents that may help open the blockage and restore normal heart circulation. Stents are small, wire, mesh-like tubes that help keep the artery open. If needed, blood thinners may be used to dissolve clots. HOME CARE INSTRUCTIONS Follow the treatment plan your caregiver prescribes. Keep a list of every medicine you are taking. Keep it up to date and with you all the time. Get help from your caregiver or  pharmacist to learn the following about each medicine: Why you are taking it. What time of day to take it. Possible side effects. What foods to take with your medication and which foods to avoid. When to stop taking your medication. Try to maintain normal cholesterol levels. Eat a heart healthy diet with salt and fat restrictions as advised. PREVENTION  You can reduce your risk of heart disease by doing the following: Visit your caregiver regularly and determine whether you are at risk. Quit smoking and keep away from those who smoke. Get your blood pressure checked regularly and make sure it remains within normal limits. Limit salt in your diet. Maintain normal blood sugar and cholesterol levels. Exercise regularly (walking or another form of aerobic activity, preferably 30 minutes continuously). Maintain ideal body weight. Reduce stress, anger, and depression. If you have already had a heart attack, consult your caregiver about methods and medicines that may help in reducing your risk of having a second heart attack. Be aware of the symptoms of heart disease and seek medical care if you develop these symptoms. SEEK IMMEDIATE MEDICAL CARE IF: You have severe chest pain or the above symptoms, call your local emergency services (911 if in [place] THIS IS AN EMERGENCY. Do not wait to see if the pain will go away. DO NOT drive yourself to the hospital. You notice increasing shortness of breath during rest, sleeping or with activity. Insist that providers take your complaints seriously and do a thorough heart evaluation. Document Released: 06/27/2007 Document Revised: 04/02/2011 Document Reviewed: 06/27/2007 [facility] Patient Information 2014 [place], [place].

## 2012-12-12 NOTE — Progress Notes (Addendum)
Subjective:    Patient ID: [name], female    DOB: 1950-03-14, 62 y.o.   MRN: [id number] Chief Complaint  Patient presents with  . Annual Exam    requesting wrist brace  . Gynecologic Exam    HPI  Did go to refres[facility] PT for 4-6 wks but they just did similar exercises and treatments that pt normally does with her personal trainer though TENS unit helped a little.  Had a foot brace OHenry made for her recently but now she needs a right wrist brace made to prevent right wrist spasms - has old one at home but is falling apart.  Speech therapist came over for about 2 wks and tried to get her to use programs on the tablet but she didn't like using them - to complicated.  Needs home health to help w/ pts ADLs like bathing.  Has not heard from any case manager w/ [facility] from the referral I placed sev mos ago.  Colonoscopy went well - no complications. Did have adenomatous polyp x 1 so Dr. Jarold Motto told pt she would have to have it repeated in 5 yrs.  Over the past wk pt has been crying sev times. Her husband thinks she was probably just really warn out from the PT. Pt denies any depressed mood or anhedonia - likes to cook (huband has to help her chop and open things, etc).  Past Medical History  Diagnosis Date  . CVA (cerebral infarction)   . Hypertension   . Seizures   . Hyperlipidemia   . Hemiparesis   . CHF (congestive heart failure)     EF 15-20% as of 05/28/11 (Dr Casimiro [facility]le [facility] D/C summary)  . Vitamin D deficiency 2010   Past Surgical History  Procedure Laterality Date  . Hip surgery    . Fracture surgery    . Abdominal hysterectomy    . Tubal ligation     Current Outpatient Prescriptions on File Prior to Visit  Medication Sig Dispense Refill  . warfarin (COUMADIN) 5 MG tablet Take 5 mg by mouth daily.       No current facility-administered medications on file prior to visit.   No Known Allergies Family History  Problem Relation Age of Onset  . Diabetes  Daughter   . Colon cancer Maternal Grandfather    History   Social History  . Marital Status: Married    Spouse Name: N/A    Number of Children: 2  . Years of Education: 12   Occupational History  .     Social History Main Topics  . Smoking status: Former Smoker    Types: Cigarettes    Quit date: 09/26/2007  . Smokeless tobacco: Never Used  . Alcohol Use: No  . Drug Use: No  . Sexual Activity: No   Other Topics Concern  . None   Social History Narrative  . None     Review of Systems  Neurological: Positive for speech difficulty.  Psychiatric/Behavioral: Positive for dysphoric mood.  All other systems reviewed and are negative.      BP 132/67  Pulse 64  Temp(Src) 98 F (36.7 C)  Resp 16  Ht 5\' 6"  (1.676 m)  Wt 126 lb (57.153 kg)  BMI 20.35 kg/m2 Objective:   Physical Exam  Constitutional: She appears well-developed and well-nourished. No distress.  HENT:  Head: Normocephalic and atraumatic.  Right Ear: Tympanic membrane, external ear and ear canal normal.  Left Ear: Tympanic membrane, external ear and ear  canal normal.  Nose: Mucosal edema present. No rhinorrhea.  Mouth/Throat: Uvula is midline, oropharynx is clear and moist and mucous membranes are normal. No posterior oropharyngeal erythema.  Eyes: Conjunctivae and EOM are normal. Pupils are equal, round, and reactive to light. Right eye exhibits no discharge. Left eye exhibits no discharge. No scleral icterus.  Neck: Normal range of motion. Neck supple. No thyromegaly present.  Cardiovascular: Normal rate, regular rhythm, normal heart sounds and intact distal pulses.   Pulmonary/Chest: Effort normal and breath sounds normal. No respiratory distress.  Abdominal: Soft. Bowel sounds are normal. There is no tenderness.  Genitourinary: Vagina normal. No breast swelling, tenderness, discharge or bleeding. No labial fusion. There is no rash, tenderness or lesion on the right labia. There is no rash, tenderness  or lesion on the left labia. Right adnexum displays no mass, no tenderness and no fullness. Left adnexum displays no mass, no tenderness and no fullness. No erythema or tenderness around the vagina. No vaginal discharge found.  Cervix and uterus surgically absent  Musculoskeletal:       Right elbow: She exhibits decreased range of motion.       Right wrist: She exhibits decreased range of motion.       Right hand: She exhibits decreased range of motion.  Lymphadenopathy:    She has no cervical adenopathy.       Right: No inguinal adenopathy present.       Left: No inguinal adenopathy present.  Neurological: She is alert. She is not disoriented. She displays atrophy. She displays no tremor. A cranial nerve deficit is present. She exhibits abnormal muscle tone. She displays no seizure activity. Coordination and gait abnormal.  Skin: Skin is warm and dry. She is not diaphoretic. No erythema.  Psychiatric: She has a normal mood and affect. Her behavior is normal.      Assessment & Plan:   Need for prophylactic vaccination and inoculation against influenza - Plan: Flu Vaccine QUAD 36+ mos IM - will need pneumovax after pt turns 62 yo next yr.  Need for Tdap vaccination - Plan: Tdap vaccine greater than or equal to 7yo IM  Need for zoster vaccination - Plan: zoster vaccine live, PF, (ZOSTAVAX) 13244 UNT/0.65ML injection  Weakness - Plan: Ambulatory referral to Hand Surgery  Seizure disorder - Plan: Digoxin level  HTN (hypertension) - Plan: CBC with Differential, Comprehensive metabolic panel  HLD (hyperlipidemia) - Plan: Lipid panel - unclear goal LDL - has been <100 and controlled on pravastatin 80mg  but may need to change LDL goal to <70 due to multiple comorbidities of h/o CHF (though seems to have resolved over time), cardioembolic CVA, and HTN. Could cons changing pt to atorvastatin 40mg  in future.  History of CVA (cerebrovascular accident)  Dysarthria due to cerebrovascular accident  - Plan: Ambulatory referral to Home Health - pt was unable to communicate something about her ears to her husband and I during her visit today - she got very upset w/ [place] not understanding her - denied trouble hearing but seemed to poss be c/o ringing in her ears. Unable to fully assess today so husband will try to cont to find out what problem is or if pt c/o this further.  Contracture of joint - Plan: Ambulatory referral to Home Health, Ambulatory referral to Hand Surgery  - prior referral for home health PT/OT/ST placed and completed but pt and her husband really need an nurse aide several times/wk to help w/ pt bathing and personal care/ADLs.  Needs DEXA due to h/o intertrochanteric hip fracture after fall at home - fell on same level, not from a height. Rec DEXA w/ mammogram next yr.  Gait instability  Hemiplegia affecting dominant side, late effect of cerebrovascular disease - Plan: Ambulatory referral to Home Health, Ambulatory referral to Hand Surgery - needs new right wrist/hand orthotic/brace to support hand and prevent contractures - I think they will prob be able to make pt a molded plastic splint for this at a hand rehab center. Does not need to see a Hydrographic surveyor.  Encounter for long-term (current) use of other medications - Plan: Digoxin level - As pt's EF had improved immensely on last echo w/ Dr. Jacinto Halim may want to consider whether we could stop digoxin. Would like Dr. Verl Dicker input on this - discuss at f/u.  Vitamin D deficiency - Noted to be 10 in 2010 - needs to be rechecked at next OV.  Meds ordered this encounter  Medications  . zoster vaccine live, PF, (ZOSTAVAX) 16109 UNT/0.65ML injection    Sig: Inject 19,400 Units into the skin once.    Dispense:  1 each    Refill:  0  . baclofen (LIORESAL) 20 MG tablet    Sig: Take 1 tablet (20 mg total) by mouth 3 (three) times daily as needed for muscle spasms.    Dispense:  90 each    Refill:  1  . carvedilol (COREG) 6.25 MG tablet     Sig: Take 1 tablet (6.25 mg total) by mouth 2 (two) times daily with a meal.    Dispense:  180 tablet    Refill:  3  . digoxin (LANOXIN) 0.25 MG tablet    Sig: Take 1 tablet (0.25 mg total) by mouth daily.    Dispense:  90 tablet    Refill:  3  . levETIRAcetam (KEPPRA) 500 MG tablet    Sig: Take 1 tablet (500 mg total) by mouth 2 (two) times daily.    Dispense:  180 tablet    Refill:  3  . lisinopril (PRINIVIL,ZESTRIL) 10 MG tablet    Sig: Take 1 tablet (10 mg total) by mouth daily.    Dispense:  90 tablet    Refill:  3  . omeprazole (PRILOSEC) 20 MG capsule    Sig: Take 1 capsule (20 mg total) by mouth daily.    Dispense:  90 capsule    Refill:  3  . pravastatin (PRAVACHOL) 80 MG tablet    Sig: Take 1 tablet (80 mg total) by mouth daily.    Dispense:  90 tablet    Refill:  3     Norberto Sorenson, MD MPH

## 2012-12-12 NOTE — Progress Notes (Addendum)
Subjective:    [name] is a 62 y.o. female who presents for Medicare Annual/Subsequent preventive examination.  Preventive Screening-Counseling & Management  Tobacco History  Smoking status  . Former Smoker  . Types: Cigarettes  . Quit date: 09/26/2007  Smokeless tobacco  . Never Used     Problems Prior to Visit 1. Feels like there is a problem with her right ear. Right cheek/eye with intermittent mild swelling. 2. Severe dysarthria - can be very frustrating for pt.  Recently saw speech therapist again who set up programs on pt's tablet to help but they are to confusing for her to use. 3.  Has right wrist brace to stabilize her hand and prevent contractures - very old and falling apart - needs new. 4. Has had several episodes of crying this week but suspect due to fatigue - not feeling depressed prior or today. 5.  Seizure disorder predating CVA, on Keppra  6. Cardioembolic stroke 2009 with residual right hemiparesis and speech difficulties. On chronic coumadin managed by Dr. Jacky Kindle though patient's PCP is at [facility].  7. History of congestive heart failure. Her ejection fraction was 5-20% in 2009. Seen by Dr. Jacinto Halim. Repeat echo 2013 showed EF had significantly improved to 50%. No further f/u needed at this time. 8. HTN 9. Hyperlipidemia 10.  ADLs - Needs home health aide for bathing, dressing, etc.   Current Problems (verified) Patient Active Problem List   Diagnosis Date Noted  . Anticoagulated on Coumadin 09/19/2011  . Cardiomyopathy in other diseases classified elsewhere 08/24/2011  . (L) MCA cardio embolic stroke  16/10/9602  . Dyslipidemia 06/25/2011  . H/O tobacco use 06/25/2011  . Weakness 05/28/2011  . History of CVA (cerebrovascular accident) 05/28/2011  . HTN (hypertension) 05/28/2011  . HLD (hyperlipidemia) 05/28/2011  . Seizure disorder 05/28/2011    Medications Prior to Visit Current Outpatient Prescriptions on File Prior to Visit  Medication Sig  Dispense Refill  . carvedilol (COREG) 6.25 MG tablet Take 1 tablet (6.25 mg total) by mouth 2 (two) times daily with a meal.  180 tablet  1  . digoxin (LANOXIN) 0.25 MG tablet Take 1 tablet (0.25 mg total) by mouth daily.  90 tablet  1  . levETIRAcetam (KEPPRA) 500 MG tablet Take 1 tablet (500 mg total) by mouth 2 (two) times daily.  180 tablet  1  . lisinopril (PRINIVIL,ZESTRIL) 10 MG tablet Take 1 tablet (10 mg total) by mouth daily.  90 tablet  1  . omeprazole (PRILOSEC) 20 MG capsule Take 1 capsule (20 mg total) by mouth daily.  90 capsule  1  . pravastatin (PRAVACHOL) 80 MG tablet Take 1 tablet (80 mg total) by mouth daily.  90 tablet  1  . warfarin (COUMADIN) 5 MG tablet Take 5 mg by mouth daily.       No current facility-administered medications on file prior to visit.    Current Medications (verified) Current Outpatient Prescriptions  Medication Sig Dispense Refill  . carvedilol (COREG) 6.25 MG tablet Take 1 tablet (6.25 mg total) by mouth 2 (two) times daily with a meal.  180 tablet  1  . digoxin (LANOXIN) 0.25 MG tablet Take 1 tablet (0.25 mg total) by mouth daily.  90 tablet  1  . levETIRAcetam (KEPPRA) 500 MG tablet Take 1 tablet (500 mg total) by mouth 2 (two) times daily.  180 tablet  1  . lisinopril (PRINIVIL,ZESTRIL) 10 MG tablet Take 1 tablet (10 mg total) by mouth daily.  90 tablet  1  . omeprazole (PRILOSEC) 20 MG capsule Take 1 capsule (20 mg total) by mouth daily.  90 capsule  1  . pravastatin (PRAVACHOL) 80 MG tablet Take 1 tablet (80 mg total) by mouth daily.  90 tablet  1  . warfarin (COUMADIN) 5 MG tablet Take 5 mg by mouth daily.      Marland Kitchen zoster vaccine live, PF, (ZOSTAVAX) 16109 UNT/0.65ML injection Inject 19,400 Units into the skin once.  1 each  0   No current facility-administered medications for this visit.     Allergies (verified) Review of patient's allergies indicates no known allergies.   PAST HISTORY  Family History Family History  Problem Relation  Age of Onset  . Diabetes Daughter   . Colon cancer Maternal Grandfather     Social History History  Substance Use Topics  . Smoking status: Former Smoker    Types: Cigarettes    Quit date: 09/26/2007  . Smokeless tobacco: Never Used  . Alcohol Use: No     Are there smokers in your home (other than you)? Yes - husband smokes but not in the house - in the wash room  Risk Factors Current exercise habits: Home exercise routine includes doing chair exercises given to her from trainer. Gym/ health club routine includes .. works w/ Psychologist, educational 2x/wk - Julaine Hua at [employer] [phone] Dietary issues discussed: trying to do low chol   Cardiac risk factors: dyslipidemia, hypertension and sedentary lifestyle.  Depression Screen (Note: if answer to either of the following is "Yes", a more complete depression screening is indicated)   Over the past two weeks, have you felt down, depressed or hopeless? No  Over the past two weeks, have you felt little interest or pleasure in doing things? No  Have you lost interest or pleasure in daily life? No  Do you often feel hopeless? Yes  Do you cry easily over simple problems? No  Activities of Daily Living In your present state of health, do you have any difficulty performing the following activities?:  Driving? No Managing money?  No Feeding yourself? Yes Getting from bed to chair? Yes Climbing a flight of stairs? No - CAN HOLD A rail and walk up but husband monitors Preparing food and eating?: No- husband helps peels and cut Bathing or showering? Yes Getting dressed: Yes Getting to the toilet? Yes Using the toilet:No Moving around from place to place: Yes - using cane and foot brace at all times. In the past year have you fallen or had a near fall?:Yes   Are you sexually active?  No  Do you have more than one partner?  No  Hearing Difficulties: No Do you often ask people to speak up or repeat themselves? No Do you  experience ringing or noises in your ears? Yes Do you have difficulty understanding soft or whispered voices? Yes   Do you feel that you have a problem with memory? No  Do you often misplace items? Yes  Do you feel safe at home?  Yes  Cognitive Testing  Alert? Yes  Normal Appearance?Yes  Oriented to person? Yes  Place? Yes   Time? Yes  Recall of three objects?  No  Can perform simple calculations? No  Displays appropriate judgment?Yes  Can read the correct time from a watch face?Yes   Advanced Directives have been discussed with the patient? Yes  List the Names of Other Physician/Practitioners you currently use: 1.  Dr. Jacky Kindle - coumadin clinic 2. Dr. Jarold Motto -  [facility] for screening colonoscopy 3.  Dr. Shelly Flatten w/ [name] -- h/o seizure d/o predating CVA 4.  Dr. Jacinto Halim - cardiology 5. Dr. Harriet Pho at [facility] 6. [facility] 7. Physical trainer - Julaine Hua at [employer] [phone] 8. Referred to [employer] several mos ago - had telephone case manager assigned - Ms. Todd - pt and her husband not aware of any contact from [facility].   Indicate any recent Medical Services you may have received from other than [facility] providers in the past year (date may be approximate).  There is no immunization history for the selected administration types on file for this patient.  Screening Tests Health Maintenance  Topic Date Due  . Pap Smear  01/09/1967  . Tetanus/tdap  01/09/1968  . Zostavax  01/08/2009  . Influenza Vaccine  08/22/2012  . Mammogram  09/09/2014  . Colonoscopy  10/22/2022    All answers were reviewed with the patient and necessary referrals were made:  [facility], [place]   12/12/2012   History reviewed: allergies, current medications, past family history, past medical history, past social history, past surgical history and problem list  Review of Systems Pertinent items are noted in HPI.   Neurological: Positive for speech difficulty.  Psychiatric/Behavioral: Positive for dysphoric mood.   Objective:     Vision by Snellen chart: right AVW:UJWJXB to measure, left JYN:WGNFAO to measure  Both eyes 20/25 - difficult to test due to severe dysarthria.  Hearing screen: Used the [employer] hearing machine. Patient could hear very good with left ear and had diminished sounds out of right ear but could hear  Body mass index is 20.35 kg/(m^2). BP 132/67  Pulse 64  Temp(Src) 98 F (36.7 C)  Resp 16  Ht 5\' 6"  (1.676 m)  Wt 126 lb (57.153 kg)  BMI 20.35 kg/m2  BP 132/67  Pulse 64  Temp(Src) 98 F (36.7 C)  Resp 16  Ht 5\' 6"  (1.676 m)  Wt 126 lb (57.153 kg)  BMI 20.35 kg/m2  General Appearance:    Alert, cooperative, no distress, appears stated age  Head:    Normocephalic, without obvious abnormality, atraumatic  Eyes:    PERRL, conjunctiva/corneas clear, EOM's intact, fundi    benign, both eyes  Ears:    Normal TM's and external ear canals, both ears  Nose:   Nares normal, septum midline, mucosa normal, no drainage    or sinus tenderness  Throat:   Lips, mucosa, and tongue normal; teeth and gums normal  Neck:   Supple, symmetrical, trachea midline, no adenopathy;    thyroid:  no enlargement/tenderness/nodules  Back:     Symmetric, no curvature, ROM normal, no CVA tenderness  Lungs:     Clear to auscultation bilaterally, respirations unlabored  Chest Wall:    No tenderness or deformity   Heart:    Regular rate and rhythm, S1 and S2 normal, no murmur, rub   or gallop  Breast Exam:    No tenderness, masses, or nipple abnormality  Abdomen:     Soft, non-tender, bowel sounds active all four quadrants,    no masses, no organomegaly  Genitalia:    Normal female without lesion, discharge or tenderness, normal bimanual exam, speculum exam not done     Extremities:   Extremities atraumatic, no cyanosis, trace lower ext edema  Pulses:   2+ and symmetric all extremities   Skin:   Skin color, texture, turgor normal, no rashes or lesions  Lymph  nodes:   Cervical, supraclavicular, and axillary nodes normal          Assessment:     Annual Medicare Wellness Exam      Plan:     During the course of the visit the patient was educated and counseled about appropriate screening and preventive services including:    Pneumococcal vaccine - none prior in epic found - rec pneumovax in 1 yr after pt turns 62 yo.  Influenza vaccine - done today  Td vaccine - done today  Herpes zoster vaccine - gave pt rx to fill at pharmacy  Screening mammography - neg 09/08/12  Screening Pap smear and pelvic exam - no further paps needed due to abd hysterectomy for benign reasons (fibroids?). Pelvic/bimanual done today and normal.  Bone densitometry screening - none prior seen in Epic - pt likely needs DEXA as did have a right intertrochanteric hip fracture in 2010 after fall at home - un witnessed but did not fall from any height.  Rec checking DEXA w/ mammogram next year.  Needs vit D level at f/u due to h/o deficiency.  Colorectal cancer screening - done 09/2012 at [facility] - repeat in 5 yrs 09/2017 due to adenomatous polyp.  Diet review for nutrition referral? Yes ____  Not Indicated _X___   Patient Instructions (the written plan) was given to the patient.  Medicare Attestation I have personally reviewed: The patient's medical and social history Their use of alcohol, tobacco or illicit drugs Their current medications and supplements The patient's functional ability including ADLs,fall risks, home safety risks, cognitive, and hearing and visual impairment Diet and physical activities Evidence for depression or mood disorders  The patient's weight, height, BMI, and visual acuity have been recorded in the chart.  I have made referrals, counseling, and provided education to the patient based on review of the above and I have provided the patient with a written  personalized care plan for preventive services.     Norberto Sorenson, MD   12/12/2012

## 2012-12-13 LAB — DIGOXIN LEVEL: Digoxin Level: 1.3 ng/mL (ref 0.8–2.0)

## 2012-12-19 ENCOUNTER — Encounter: Payer: Self-pay | Admitting: Family Medicine

## 2012-12-19 ENCOUNTER — Other Ambulatory Visit: Payer: Self-pay | Admitting: Radiology

## 2012-12-19 DIAGNOSIS — I639 Cerebral infarction, unspecified: Secondary | ICD-10-CM

## 2012-12-19 DIAGNOSIS — E559 Vitamin D deficiency, unspecified: Secondary | ICD-10-CM | POA: Insufficient documentation

## 2012-12-20 ENCOUNTER — Encounter: Payer: Self-pay | Admitting: Family Medicine

## 2012-12-22 ENCOUNTER — Other Ambulatory Visit: Payer: Self-pay | Admitting: Radiology

## 2012-12-22 DIAGNOSIS — M24541 Contracture, right hand: Secondary | ICD-10-CM

## 2013-01-02 ENCOUNTER — Telehealth: Payer: Self-pay | Admitting: Radiology

## 2013-01-02 NOTE — Telephone Encounter (Signed)
I have a face to face encounter from [facility] for physical therapy and nursing care, in your box please sign and return to me

## 2013-01-03 NOTE — Telephone Encounter (Signed)
Signed and placed in fax back box.

## 2013-01-16 ENCOUNTER — Telehealth: Payer: Self-pay

## 2013-01-16 NOTE — Telephone Encounter (Signed)
Please advise if okay? I can type the letter.

## 2013-01-16 NOTE — Telephone Encounter (Addendum)
PT HAS HAD A STROKE AND PRIMARY CARETAKER IS HER HUSBAND [name],  HE HAVE BEEN CALLED FOR JURY DUTY AND WOULD LIKE A LETTER STATING HE WON'T BE ABLE TO DO SINCE SHE NEED HIS ATTENTION ALL DAY. PLEASE CALL [phone] OR [phone]  PT'S DOB IS [birth year] AND NOT IN THE EPIC SYSTEM

## 2013-01-16 NOTE — Telephone Encounter (Signed)
Yes, absolutely ok.     Thank you.

## 2013-01-19 NOTE — Telephone Encounter (Signed)
Printed. Called him to advise.

## 2013-01-28 ENCOUNTER — Telehealth: Payer: Self-pay

## 2013-01-28 MED ORDER — CARVEDILOL 6.25 MG PO TABS: 6.2500 mg | ORAL_TABLET | Freq: Two times a day (BID) | ORAL | Status: DC

## 2013-01-28 MED ORDER — PRAVASTATIN SODIUM 80 MG PO TABS: 80.0000 mg | ORAL_TABLET | Freq: Every day | ORAL | Status: DC

## 2013-01-28 MED ORDER — BACLOFEN 20 MG PO TABS: 20.0000 mg | ORAL_TABLET | Freq: Three times a day (TID) | ORAL | Status: DC | PRN

## 2013-01-28 MED ORDER — LISINOPRIL 10 MG PO TABS: 10.0000 mg | ORAL_TABLET | Freq: Every day | ORAL | Status: DC

## 2013-01-28 MED ORDER — LEVETIRACETAM 500 MG PO TABS
500.0000 mg | ORAL_TABLET | Freq: Two times a day (BID) | ORAL | Status: DC
Start: 2013-01-28 — End: 2013-10-05

## 2013-01-28 MED ORDER — WARFARIN SODIUM 5 MG PO TABS: ORAL_TABLET | ORAL | Status: DC

## 2013-01-28 MED ORDER — DIGOXIN 250 MCG PO TABS: 0.2500 mg | ORAL_TABLET | Freq: Every day | ORAL | Status: DC

## 2013-01-28 NOTE — Telephone Encounter (Signed)
Looks like they were sent to [facility], have resent all to [facility] He is asking for the coumadin, but this one was not written by you, do you want to send this? please advise, pended

## 2013-01-28 NOTE — Telephone Encounter (Signed)
[name] STATES WE WERE GOING TO ORDER A 90 DAY SUPPLY OF HIS WIFE'S MEDICINE TO [facility] AND WOULD LIKE TO KNOW THE STATUS PLEASE CALL [phone]

## 2013-01-28 NOTE — Telephone Encounter (Signed)
Coumadin refilled - [name] always sends copies of her coumadin clinic visits - scanned in and reviewed.

## 2013-02-19 ENCOUNTER — Encounter: Payer: Self-pay | Admitting: Family Medicine

## 2013-04-11 ENCOUNTER — Telehealth: Payer: Self-pay

## 2013-04-11 NOTE — Telephone Encounter (Signed)
[facility] is needing [place] to call to have a nurse go out to the patients home.  Patient's spouse [name] called [facility] and they stated the doctor needed to call to ok them to go out to patients home.   Best# for [facility]: [phone]   # for patient: [phone]

## 2013-04-11 NOTE — Telephone Encounter (Signed)
Patient husband called stated Shekira need a fax from doctor office sent to [facility] stating she need a nurse to be sent to her home for patient care. Stated patient is not feeling well. Please call  [place] [phone]

## 2013-04-12 ENCOUNTER — Ambulatory Visit: Payer: Medicare Other

## 2013-04-12 ENCOUNTER — Ambulatory Visit (INDEPENDENT_AMBULATORY_CARE_PROVIDER_SITE_OTHER): Payer: Medicare HMO | Admitting: Emergency Medicine

## 2013-04-12 VITALS — BP 114/104 | HR 74 | Temp 98.1°F | Resp 18 | Wt 122.0 lb

## 2013-04-12 DIAGNOSIS — R1011 Right upper quadrant pain: Secondary | ICD-10-CM

## 2013-04-12 LAB — COMPREHENSIVE METABOLIC PANEL WITH GFR
ALT: 12 U/L (ref 0–35)
AST: 22 U/L (ref 0–37)
Albumin: 4.7 g/dL (ref 3.5–5.2)
Alkaline Phosphatase: 100 U/L (ref 39–117)
BUN: 18 mg/dL (ref 6–23)
CO2: 25 meq/L (ref 19–32)
Calcium: 9.9 mg/dL (ref 8.4–10.5)
Chloride: 102 meq/L (ref 96–112)
Creat: 0.88 mg/dL (ref 0.50–1.10)
Glucose, Bld: 83 mg/dL (ref 70–99)
Potassium: 5.1 meq/L (ref 3.5–5.3)
Sodium: 138 meq/L (ref 135–145)
Total Bilirubin: 0.5 mg/dL (ref 0.2–1.2)
Total Protein: 8.2 g/dL (ref 6.0–8.3)

## 2013-04-12 LAB — POCT CBC
Granulocyte percent: 58.5 % (ref 37–80)
HCT, POC: 43.5 % (ref 37.7–47.9)
Hemoglobin: 13.5 g/dL (ref 12.2–16.2)
Lymph, poc: 4.1 — AB (ref 0.6–3.4)
MCH, POC: 26.8 pg — AB (ref 27–31.2)
MCHC: 31 g/dL — AB (ref 31.8–35.4)
MCV: 86.4 fL (ref 80–97)
MID (cbc): 0.7 (ref 0–0.9)
MPV: 10.6 fL (ref 0–99.8)
POC Granulocyte: 6.8 (ref 2–6.9)
POC LYMPH PERCENT: 35.3 % (ref 10–50)
POC MID %: 6.2 % (ref 0–12)
Platelet Count, POC: 164 10*3/uL (ref 142–424)
RBC: 5.03 M/uL (ref 4.04–5.48)
RDW, POC: 16 %
WBC: 11.7 10*3/uL — AB (ref 4.6–10.2)

## 2013-04-12 LAB — AMYLASE: Amylase: 81 U/L (ref 0–105)

## 2013-04-12 LAB — LIPASE: LIPASE: 31 U/L (ref 0–75)

## 2013-04-12 MED ORDER — POLYETHYLENE GLYCOL 3350 17 GM/SCOOP PO POWD: 17.0000 g | Freq: Every day | ORAL | Status: DC

## 2013-04-12 NOTE — Progress Notes (Addendum)
[facility] [address], [place] [address] [phone]  Date:  04/12/2013   Name:  [name]   DOB:  17-Jan-1951   MRN:  [id number]  PCP:  Delman Cheadle, MD    Chief Complaint: spot under right breast   History of Present Illness:  [name] is a 63 y.o. very pleasant female patient who presents with the following:  Thursday noted to have a "mass" in her RUQ and tenderness and acted as though she was in pain.  Poor appetite.  No nausea or vomiting.  No stool change.  No specific food intolerance per daughter who is the informant.  Patient is not meaningfully verbal.  No fever or chills.  Less active than normal. Normal level of alertness and communication.  No improvement with over the counter medications or other home remedies. Denies other complaint or health concern today.   Patient Active Problem List   Diagnosis Date Noted  . Unspecified vitamin D deficiency 12/19/2012  . Dysarthria due to cerebrovascular accident 12/12/2012  . Gait instability 12/12/2012  . Contracture of joint 12/12/2012  . Hemiplegia affecting dominant side, late effect of cerebrovascular disease 12/12/2012  . Anticoagulated on Coumadin 09/19/2011  . Cardiomyopathy in other diseases classified elsewhere 08/24/2011  . (L) MCA cardio embolic stroke  12/45/8099  . Dyslipidemia 06/25/2011  . H/O tobacco use 06/25/2011  . Weakness 05/28/2011  . History of CVA (cerebrovascular accident) 05/28/2011  . HTN (hypertension) 05/28/2011  . HLD (hyperlipidemia) 05/28/2011  . Seizure disorder 05/28/2011    Past Medical History  Diagnosis Date  . CVA (cerebral infarction)   . Hypertension   . Seizures   . Hyperlipidemia   . Hemiparesis   . CHF (congestive heart failure)     EF 15-20% as of 05/28/11 (Dr [facility] summary)  . Vitamin D deficiency 2010  . Adenomatous polyp of colon 09/2012    repeat colonoscopy in 5 years by Dr. Sharlett Iles    Past Surgical  History  Procedure Laterality Date  . Hip surgery    . Fracture surgery    . Abdominal hysterectomy    . Tubal ligation      History  Substance Use Topics  . Smoking status: Former Smoker    Types: Cigarettes    Quit date: 09/26/2007  . Smokeless tobacco: Never Used  . Alcohol Use: No    Family History  Problem Relation Age of Onset  . Diabetes Daughter   . Colon cancer Maternal Grandfather     No Known Allergies  Medication list has been reviewed and updated.  Current Outpatient Prescriptions on File Prior to Visit  Medication Sig Dispense Refill  . baclofen (LIORESAL) 20 MG tablet Take 1 tablet (20 mg total) by mouth 3 (three) times daily as needed for muscle spasms.  90 each  0  . carvedilol (COREG) 6.25 MG tablet Take 1 tablet (6.25 mg total) by mouth 2 (two) times daily with a meal.  180 tablet  2  . digoxin (LANOXIN) 0.25 MG tablet Take 1 tablet (0.25 mg total) by mouth daily.  90 tablet  2  . lisinopril (PRINIVIL,ZESTRIL) 10 MG tablet Take 1 tablet (10 mg total) by mouth daily.  90 tablet  2  . pravastatin (PRAVACHOL) 80 MG tablet Take 1 tablet (80 mg total) by mouth daily.  90 tablet  2  . warfarin (COUMADIN) 5 MG tablet Take as directed by coumadin clinic.  90 tablet  1  .  levETIRAcetam (KEPPRA) 500 MG tablet Take 1 tablet (500 mg total) by mouth 2 (two) times daily.  180 tablet  2   No current facility-administered medications on file prior to visit.    Review of Systems:  As per HPI, otherwise negative.    Physical Examination: Filed Vitals:   04/12/13 1132  BP: 114/104  Pulse: 74  Temp: 98.1 F (36.7 C)  Resp: 18   Filed Vitals:   04/12/13 1132  Weight: 122 lb (55.339 kg)   Body mass index is 19.7 kg/(m^2). Ideal Body Weight:    GEN: WDWN, NAD, Non-toxic, Alert and communicative.  Not verbal HEENT: Atraumatic, Normocephalic. Neck supple. No masses, No LAD. Ears and Nose: No external deformity. CV: RRR, No M/G/R. No JVD. No thrill. No extra  heart sounds. PULM: CTA B, no wheezes, crackles, rhonchi. No retractions. No resp. distress. No accessory muscle use. ABD: S, tender in RUQ minimally , ND, +BS. No rebound. No HSM. EXTR: No c/c/e NEURO assisted ataxic gait.  Spastic contracture right arm.  PSYCH: Normally interactive. Not depressed or anxious appearing.  Calm demeanor.    Assessment and Plan: RUQ pain Constipation Fleets miralax Follow up if no improvement or new or worsened symptoms   Signed,  Ellison Carwin, MD   Results for orders placed in visit on 04/12/13  POCT CBC      Result Value Ref Range   WBC 11.7 (*) 4.6 - 10.2 K/uL   Lymph, poc 4.1 (*) 0.6 - 3.4   POC LYMPH PERCENT 35.3  10 - 50 %L   MID (cbc) 0.7  0 - 0.9   POC MID % 6.2  0 - 12 %M   POC Granulocyte 6.8  2 - 6.9   Granulocyte percent 58.5  37 - 80 %G   RBC 5.03  4.04 - 5.48 M/uL   Hemoglobin 13.5  12.2 - 16.2 g/dL   HCT, POC 43.5  37.7 - 47.9 %   MCV 86.4  80 - 97 fL   MCH, POC 26.8 (*) 27 - 31.2 pg   MCHC 31.0 (*) 31.8 - 35.4 g/dL   RDW, POC 16.0     Platelet Count, POC 164  142 - 424 K/uL   MPV 10.6  0 - 99.8 fL   [facility] reading (PRIMARY) by  Dr. Ouida Sills.  Scoliosis.  Constipation.  No free air or SBO.  Patient requires continued home care nursing.

## 2013-04-12 NOTE — Patient Instructions (Signed)
Abdominal Pain During Pregnancy Abdominal pain is common in pregnancy. Most of the time, it does not cause harm. There are many causes of abdominal pain. Some causes are more serious than others. Some of the causes of abdominal pain in pregnancy are easily diagnosed. Occasionally, the diagnosis takes time to understand. Other times, the cause is not determined. Abdominal pain can be a sign that something is very wrong with the pregnancy, or the pain may have nothing to do with the pregnancy at all. For this reason, always tell your health care provider if you have any abdominal discomfort. HOME CARE INSTRUCTIONS  Monitor your abdominal pain for any changes. The following actions may help to alleviate any discomfort you are experiencing:  Do not have sexual intercourse or put anything in your vagina until your symptoms go away completely.  Get plenty of rest until your pain improves.  Drink clear fluids if you feel nauseous. Avoid solid food as long as you are uncomfortable or nauseous.  Only take over-the-counter or prescription medicine as directed by your health care provider.  Keep all follow-up appointments with your health care provider. SEEK IMMEDIATE MEDICAL CARE IF:  You are bleeding, leaking fluid, or passing tissue from the vagina.  You have increasing pain or cramping.  You have persistent vomiting.  You have painful or bloody urination.  You have a fever.  You notice a decrease in your baby's movements.  You have extreme weakness or feel faint.  You have shortness of breath, with or without abdominal pain.  You develop a severe headache with abdominal pain.  You have abnormal vaginal discharge with abdominal pain.  You have persistent diarrhea.  You have abdominal pain that continues even after rest, or gets worse. MAKE SURE YOU:   Understand these instructions.  Will watch your condition.  Will get help right away if you are not doing well or get  worse. Document Released: 01/08/2005 Document Revised: 10/29/2012 Document Reviewed: 08/07/2012 [facility] Patient Information 2014 [place], [place]. Constipation, Adult Constipation is when a person has fewer than 3 bowel movements a week; has difficulty having a bowel movement; or has stools that are dry, hard, or larger than normal. As people grow older, constipation is more common. If you try to fix constipation with medicines that make you have a bowel movement (laxatives), the problem may get worse. Long-term laxative use may cause the muscles of the colon to become weak. A low-fiber diet, not taking in enough fluids, and taking certain medicines may make constipation worse. CAUSES   Certain medicines, such as antidepressants, pain medicine, iron supplements, antacids, and water pills.   Certain diseases, such as diabetes, irritable bowel syndrome (IBS), thyroid disease, or depression.   Not drinking enough water.   Not eating enough fiber-rich foods.   Stress or travel.  Lack of physical activity or exercise.  Not going to the restroom when there is the urge to have a bowel movement.  Ignoring the urge to have a bowel movement.  Using laxatives too much. SYMPTOMS   Having fewer than 3 bowel movements a week.   Straining to have a bowel movement.   Having hard, dry, or larger than normal stools.   Feeling full or bloated.   Pain in the lower abdomen.  Not feeling relief after having a bowel movement. DIAGNOSIS  Your caregiver will take a medical history and perform a physical exam. Further testing may be done for severe constipation. Some tests may include:   A barium  enema X-ray to examine your rectum, colon, and sometimes, your small intestine.  A sigmoidoscopy to examine your lower colon.  A colonoscopy to examine your entire colon. TREATMENT  Treatment will depend on the severity of your constipation and what is causing it. Some dietary treatments include  drinking more fluids and eating more fiber-rich foods. Lifestyle treatments may include regular exercise. If these diet and lifestyle recommendations do not help, your caregiver may recommend taking over-the-counter laxative medicines to help you have bowel movements. Prescription medicines may be prescribed if over-the-counter medicines do not work.  HOME CARE INSTRUCTIONS   Increase dietary fiber in your diet, such as fruits, vegetables, whole grains, and beans. Limit high-fat and processed sugars in your diet, such as [place] fries, hamburgers, cookies, candies, and soda.   A fiber supplement may be added to your diet if you cannot get enough fiber from foods.   Drink enough fluids to keep your urine clear or pale yellow.   Exercise regularly or as directed by your caregiver.   Go to the restroom when you have the urge to go. Do not hold it.  Only take medicines as directed by your caregiver. Do not take other medicines for constipation without talking to your caregiver first. [place] IF:   You have bright red blood in your stool.   Your constipation lasts for more than 4 days or gets worse.   You have abdominal or rectal pain.   You have thin, pencil-like stools.  You have unexplained weight loss. MAKE SURE YOU:   Understand these instructions.  Will watch your condition.  Will get help right away if you are not doing well or get worse. Document Released: 10/07/2003 Document Revised: 04/02/2011 Document Reviewed: 10/20/2012 [facility] Patient Information 2014 [place], [place].

## 2013-04-12 NOTE — Telephone Encounter (Signed)
Lm for pt to RTC or rtn call. Duplicate messages.

## 2013-05-22 ENCOUNTER — Ambulatory Visit: Payer: Medicare Other | Admitting: Family Medicine

## 2013-06-14 ENCOUNTER — Other Ambulatory Visit: Payer: Self-pay | Admitting: Family Medicine

## 2013-06-14 NOTE — Telephone Encounter (Signed)
Dr. Brigitte Pulse - do you want to refill pt's warfarin?  The last INR I see is from Oct 2014, she last saw you in Jan 2015

## 2013-08-19 ENCOUNTER — Emergency Department (HOSPITAL_COMMUNITY): Payer: Medicare HMO

## 2013-08-19 ENCOUNTER — Emergency Department (HOSPITAL_COMMUNITY)
Admission: EM | Admit: 2013-08-19 | Discharge: 2013-08-19 | Disposition: A | Discharge status: Home / Self Care | Payer: Medicare HMO | Attending: Emergency Medicine | Admitting: Emergency Medicine

## 2013-08-19 ENCOUNTER — Encounter (HOSPITAL_COMMUNITY): Payer: Self-pay | Admitting: Emergency Medicine

## 2013-08-19 ENCOUNTER — Ambulatory Visit (INDEPENDENT_AMBULATORY_CARE_PROVIDER_SITE_OTHER): Payer: Medicare HMO | Admitting: Family Medicine

## 2013-08-19 VITALS — BP 130/80 | HR 67 | Temp 98.1°F | Resp 16 | Wt 119.6 lb

## 2013-08-19 DIAGNOSIS — R5383 Other fatigue: Secondary | ICD-10-CM

## 2013-08-19 DIAGNOSIS — I10 Essential (primary) hypertension: Secondary | ICD-10-CM | POA: Diagnosis not present

## 2013-08-19 DIAGNOSIS — Z87891 Personal history of nicotine dependence: Secondary | ICD-10-CM | POA: Insufficient documentation

## 2013-08-19 DIAGNOSIS — R079 Chest pain, unspecified: Secondary | ICD-10-CM

## 2013-08-19 DIAGNOSIS — J159 Unspecified bacterial pneumonia: Secondary | ICD-10-CM | POA: Diagnosis not present

## 2013-08-19 DIAGNOSIS — Z860100 Personal history of colon polyps, unspecified: Secondary | ICD-10-CM | POA: Insufficient documentation

## 2013-08-19 DIAGNOSIS — R52 Pain, unspecified: Secondary | ICD-10-CM

## 2013-08-19 DIAGNOSIS — Z79899 Other long term (current) drug therapy: Secondary | ICD-10-CM | POA: Insufficient documentation

## 2013-08-19 DIAGNOSIS — Z8673 Personal history of transient ischemic attack (TIA), and cerebral infarction without residual deficits: Secondary | ICD-10-CM | POA: Insufficient documentation

## 2013-08-19 DIAGNOSIS — Z7902 Long term (current) use of antithrombotics/antiplatelets: Secondary | ICD-10-CM | POA: Diagnosis not present

## 2013-08-19 DIAGNOSIS — E785 Hyperlipidemia, unspecified: Secondary | ICD-10-CM | POA: Insufficient documentation

## 2013-08-19 DIAGNOSIS — R531 Weakness: Secondary | ICD-10-CM

## 2013-08-19 DIAGNOSIS — G40909 Epilepsy, unspecified, not intractable, without status epilepticus: Secondary | ICD-10-CM | POA: Insufficient documentation

## 2013-08-19 DIAGNOSIS — R5381 Other malaise: Secondary | ICD-10-CM | POA: Diagnosis present

## 2013-08-19 DIAGNOSIS — R29818 Other symptoms and signs involving the nervous system: Secondary | ICD-10-CM | POA: Diagnosis not present

## 2013-08-19 DIAGNOSIS — I509 Heart failure, unspecified: Secondary | ICD-10-CM | POA: Diagnosis not present

## 2013-08-19 DIAGNOSIS — J189 Pneumonia, unspecified organism: Secondary | ICD-10-CM

## 2013-08-19 DIAGNOSIS — R4701 Aphasia: Secondary | ICD-10-CM

## 2013-08-19 LAB — COMPREHENSIVE METABOLIC PANEL
ALT: 14 U/L (ref 0–35)
ANION GAP: 15 (ref 5–15)
AST: 29 U/L (ref 0–37)
Albumin: 4.3 g/dL (ref 3.5–5.2)
Alkaline Phosphatase: 103 U/L (ref 39–117)
BILIRUBIN TOTAL: 0.3 mg/dL (ref 0.3–1.2)
BUN: 16 mg/dL (ref 6–23)
CHLORIDE: 101 meq/L (ref 96–112)
CO2: 25 mEq/L (ref 19–32)
CREATININE: 0.85 mg/dL (ref 0.50–1.10)
Calcium: 9.4 mg/dL (ref 8.4–10.5)
GFR calc Af Amer: 82 mL/min — ABNORMAL LOW (ref >90)
GFR calc non Af Amer: 71 mL/min — ABNORMAL LOW (ref >90)
Glucose, Bld: 91 mg/dL (ref 70–99)
Potassium: 5.7 mEq/L — ABNORMAL HIGH (ref 3.7–5.3)
Sodium: 141 mEq/L (ref 137–147)
Total Protein: 8.2 g/dL (ref 6.0–8.3)

## 2013-08-19 LAB — CBC WITH DIFFERENTIAL/PLATELET
BASOS PCT: 0 % (ref 0–1)
Basophils Absolute: 0 10*3/uL (ref 0.0–0.1)
EOS PCT: 0 % (ref 0–5)
Eosinophils Absolute: 0 10*3/uL (ref 0.0–0.7)
HEMATOCRIT: 42.5 % (ref 36.0–46.0)
Hemoglobin: 13.8 g/dL (ref 12.0–15.0)
Lymphocytes Relative: 33 % (ref 12–46)
Lymphs Abs: 3.1 10*3/uL (ref 0.7–4.0)
MCH: 27.9 pg (ref 26.0–34.0)
MCHC: 32.5 g/dL (ref 30.0–36.0)
MCV: 86 fL (ref 78.0–100.0)
MONO ABS: 0.5 10*3/uL (ref 0.1–1.0)
MONOS PCT: 6 % (ref 3–12)
NEUTROS ABS: 5.7 10*3/uL (ref 1.7–7.7)
Neutrophils Relative %: 61 % (ref 43–77)
Platelets: 297 10*3/uL (ref 150–400)
RBC: 4.94 MIL/uL (ref 3.87–5.11)
RDW: 14 % (ref 11.5–15.5)
WBC: 9.3 10*3/uL (ref 4.0–10.5)

## 2013-08-19 LAB — DIGOXIN LEVEL: Digoxin Level: 1.4 ng/mL (ref 0.8–2.0)

## 2013-08-19 LAB — I-STAT TROPONIN, ED: Troponin i, poc: 0.01 ng/mL (ref 0.00–0.08)

## 2013-08-19 LAB — URINALYSIS, ROUTINE W REFLEX MICROSCOPIC
Bilirubin Urine: NEGATIVE
Glucose, UA: NEGATIVE mg/dL
Hgb urine dipstick: NEGATIVE
Ketones, ur: NEGATIVE mg/dL
Nitrite: NEGATIVE
PH: 5.5 (ref 5.0–8.0)
Protein, ur: NEGATIVE mg/dL
SPECIFIC GRAVITY, URINE: 1.014 (ref 1.005–1.030)
Urobilinogen, UA: 1 mg/dL (ref 0.0–1.0)

## 2013-08-19 LAB — PROTIME-INR
INR: 2.12 — ABNORMAL HIGH (ref 0.00–1.49)
Prothrombin Time: 23.7 seconds — ABNORMAL HIGH (ref 11.6–15.2)

## 2013-08-19 LAB — URINE MICROSCOPIC-ADD ON

## 2013-08-19 MED ORDER — AZITHROMYCIN 250 MG PO TABS: ORAL_TABLET | ORAL | Status: DC

## 2013-08-19 NOTE — ED Notes (Signed)
Per EMS pt comes from [facility], with h/o stroke in 2009, with c/o generalized pain and malaise. Pt fell 6 weeks ago and was not hurt from fall. Pt A&Ox4 in NAD. Pt denies chest pain, n/v/d. VSS: BP 145/70, P48, R16, 97% rm air, sinus brady on monitor. No meds given in route.

## 2013-08-19 NOTE — ED Provider Notes (Signed)
CSN: [id number]     Arrival date & time 08/19/13  1205 History   First MD Initiated Contact with Patient 08/19/13 1225     Chief Complaint  Patient presents with  . Generalized pain      (Consider location/radiation/quality/duration/timing/severity/associated sxs/prior Treatment) HPI Comments: Patient is a 63 year old female with history of CVA in 2009 with resulting right hemiparesis and expressive aphasia. She was sent from urgent care for further workup of weakness and generalized pain. History is somewhat difficult to obtain secondary to baseline neurologic deficits, however she reports "hurting all over". According to her husband, and she sleeps a lot and does not have the same energy she used to have. The husband denies any medication changes. The patient denies any fevers or chills.  Patient is a 63 y.o. female presenting with weakness. The history is provided by the patient.  Weakness This is a new problem. Episode onset: One week ago. The problem occurs constantly. The problem has been gradually worsening. Pertinent negatives include no chest pain. Nothing aggravates the symptoms. Nothing relieves the symptoms. She has tried nothing for the symptoms. The treatment provided no relief.    Past Medical History  Diagnosis Date  . CVA (cerebral infarction)   . Hypertension   . Seizures   . Hyperlipidemia   . Hemiparesis   . CHF (congestive heart failure)     EF 15-20% as of 05/28/11 (Dr [facility] summary)  . Vitamin D deficiency 2010  . Adenomatous polyp of colon 09/2012    repeat colonoscopy in 5 years by Dr. Sharlett Iles   Past Surgical History  Procedure Laterality Date  . Hip surgery    . Fracture surgery    . Abdominal hysterectomy    . Tubal ligation     Family History  Problem Relation Age of Onset  . Diabetes Daughter   . Colon cancer Maternal Grandfather    History  Substance Use Topics  . Smoking status: Former Smoker    Types: Cigarettes   Quit date: 09/26/2007  . Smokeless tobacco: Never Used  . Alcohol Use: No   OB History   Grav Para Term Preterm Abortions TAB SAB Ect Mult Living                 Review of Systems  Constitutional: Positive for fatigue. Negative for fever.  Cardiovascular: Negative for chest pain.  Neurological: Positive for weakness.  All other systems reviewed and are negative.     Allergies  Review of patient's allergies indicates no known allergies.  Home Medications   Prior to Admission medications   Medication Sig Start Date End Date Taking? Authorizing Provider  baclofen (LIORESAL) 20 MG tablet Take 1 tablet (20 mg total) by mouth 3 (three) times daily as needed for muscle spasms. 01/28/13   [name], MD  carvedilol (COREG) 6.25 MG tablet Take 1 tablet (6.25 mg total) by mouth 2 (two) times daily with a meal. 01/28/13   [name], MD  digoxin (LANOXIN) 0.25 MG tablet Take 1 tablet (0.25 mg total) by mouth daily. 01/28/13   [name], MD  levETIRAcetam (KEPPRA) 500 MG tablet Take 1 tablet (500 mg total) by mouth 2 (two) times daily. 01/28/13   [name], MD  lisinopril (PRINIVIL,ZESTRIL) 10 MG tablet Take 1 tablet (10 mg total) by mouth daily. 01/28/13   [name], MD  polyethylene glycol powder (GLYCOLAX/MIRALAX) powder Take 17 g by mouth daily. 04/12/13   Ellison Carwin,  MD  pravastatin (PRAVACHOL) 80 MG tablet Take 1 tablet (80 mg total) by mouth daily. 01/28/13   [name], MD  rosuvastatin (CRESTOR) 20 MG tablet Take 20 mg by mouth daily.    Historical Provider, MD  warfarin (COUMADIN) 5 MG tablet TAKE AS DIRECTED BY COUMADIN CLINIC.    [name], MD   BP 114/95  Pulse 59  Resp 22  SpO2 98% Physical Exam  Nursing note and vitals reviewed. Constitutional: No distress.  HENT:  Head: Normocephalic and atraumatic.  Eyes: EOM are normal. Pupils are equal, round, and reactive to light.  Neck: Normal range of motion. Neck supple.  Cardiovascular: Normal rate and regular rhythm.   No  murmur heard. Pulmonary/Chest: Effort normal and breath sounds normal. No respiratory distress. She has no wheezes.  Abdominal: Soft. Bowel sounds are normal. She exhibits no distension. There is no tenderness.  Musculoskeletal: Normal range of motion. She exhibits no edema.  Lymphadenopathy:    She has no cervical adenopathy.  Neurological: She is alert. No cranial nerve deficit. She exhibits abnormal muscle tone.  Patient is awake and alert. She appears to be oriented to person, place, and time. A right sided hemiparesis is noted. She also appears to have an expressive aphasia and speech that is extremely difficult to comprehend.  Skin: Skin is warm and dry. She is not diaphoretic.    ED Course  Procedures (including critical care time) Labs Review Labs Reviewed  CBC WITH DIFFERENTIAL  COMPREHENSIVE METABOLIC PANEL  PROTIME-INR  DIGOXIN LEVEL  I-STAT Grandfather, ED    Imaging Review No results found.   EKG Interpretation   Date/Time:  Wednesday August 19 2013 13:05:21 EDT Ventricular Rate:  51 PR Interval:  47 QRS Duration: 126 QT Interval:  418 QTC Calculation: 385 R Axis:   -62 Text Interpretation:  Sinus rhythm Right bundle branch block Inferior  infarct, age indeterminate Baseline wander in lead(s) I III aVL Confirmed  by DELOS  MD, Angelo Caroll ([id number]) on 08/19/2013 1:28:23 PM      MDM   Final diagnoses:  None    Patient brought for evaluation of generalized weakness and pain. She has a history of CVA with an expressive aphasia. She was sent from urgent care for further evaluation. Workup reveals no elevation of white count, unremarkable electrolytes, therapeutic dig level. Chest x-ray does reveal evidence for atelectasis versus developing pneumonia in the lung bases. She recently developed a runny nose and some congestion. This may well be the cause of her symptoms. She will be treated with Zithromax for the possible infiltrate in the lungs and is to return as needed if  she worsens. There is no fever or hypoxia or respiratory distress, and I doubt an emergent, life-threatening condition.    Veryl Speak, MD 08/19/13 [id number]

## 2013-08-19 NOTE — Discharge Instructions (Signed)
Zithromax as prescribed.   Return to the emergency department if you develop difficulty breathing, high fever, vomiting, or other concerning symptoms.   Pneumonia Pneumonia is an infection of the lungs.  CAUSES Pneumonia may be caused by bacteria or a virus. Usually, these infections are caused by breathing infectious particles into the lungs (respiratory tract). SIGNS AND SYMPTOMS   Cough.  Fever.  Chest pain.  Increased rate of breathing.  Wheezing.  Mucus production. DIAGNOSIS  If you have the common symptoms of pneumonia, your health care provider will typically confirm the diagnosis with a chest X-ray. The X-ray will show an abnormality in the lung (pulmonary infiltrate) if you have pneumonia. Other tests of your blood, urine, or sputum may be done to find the specific cause of your pneumonia. Your health care provider may also do tests (blood gases or pulse oximetry) to see how well your lungs are working. TREATMENT  Some forms of pneumonia may be spread to other people when you cough or sneeze. You may be asked to wear a mask before and during your exam. Pneumonia that is caused by bacteria is treated with antibiotic medicine. Pneumonia that is caused by the influenza virus may be treated with an antiviral medicine. Most other viral infections must run their course. These infections will not respond to antibiotics.  HOME CARE INSTRUCTIONS   Cough suppressants may be used if you are losing too much rest. However, coughing protects you by clearing your lungs. You should avoid using cough suppressants if you can.  Your health care provider may have prescribed medicine if he or she thinks your pneumonia is caused by bacteria or influenza. Finish your medicine even if you start to feel better.  Your health care provider may also prescribe an expectorant. This loosens the mucus to be coughed up.  Take medicines only as directed by your health care provider.  Do not smoke. Smoking  is a common cause of bronchitis and can contribute to pneumonia. If you are a smoker and continue to smoke, your cough may last several weeks after your pneumonia has cleared.  A cold steam vaporizer or humidifier in your room or home may help loosen mucus.  Coughing is often worse at night. Sleeping in a semi-upright position in a recliner or using a couple pillows under your head will help with this.  Get rest as you feel it is needed. Your body will usually let you know when you need to rest. PREVENTION A pneumococcal shot (vaccine) is available to prevent a common bacterial cause of pneumonia. This is usually suggested for:  People over 29 years old.  Patients on chemotherapy.  People with chronic lung problems, such as bronchitis or emphysema.  People with immune system problems. If you are over 65 or have a high risk condition, you may receive the pneumococcal vaccine if you have not received it before. In some countries, a routine influenza vaccine is also recommended. This vaccine can help prevent some cases of pneumonia.You may be offered the influenza vaccine as part of your care. If you smoke, it is time to quit. You may receive instructions on how to stop smoking. Your health care provider can provide medicines and counseling to help you quit. SEEK MEDICAL CARE IF: You have a fever. SEEK IMMEDIATE MEDICAL CARE IF:   Your illness becomes worse. This is especially true if you are elderly or weakened from any other disease.  You cannot control your cough with suppressants and are losing sleep.  You begin coughing up blood.  You develop pain which is getting worse or is uncontrolled with medicines.  Any of the symptoms which initially brought you in for treatment are getting worse rather than better.  You develop shortness of breath or chest pain. MAKE SURE YOU:   Understand these instructions.  Will watch your condition.  Will get help right away if you are not doing  well or get worse. Document Released: 01/08/2005 Document Revised: 05/25/2013 Document Reviewed: 03/30/2010 [facility] Patient Information 2015 [place], [place]. This information is not intended to replace advice given to you by your health care provider. Make sure you discuss any questions you have with your health care provider.

## 2013-08-19 NOTE — ED Notes (Signed)
Dr. Delo at bedside. 

## 2013-08-19 NOTE — Progress Notes (Signed)
[facility] [address], [place] [address] [phone]  Date:  08/19/2013   Name:  [name]   DOB:  16-Apr-1950   MRN:  [id number]  PCP:  Delman Cheadle, MD    Chief Complaint: Anorexia and Generalized Body Aches   History of Present Illness:  [name] is a 63 y.o. very pleasant female patient who presents with the following:  Here today with illness.  She is s/p CVA and has expressive aphasia which limits her ability to communicate. Here with her husband today.  Per his report she has complaint of decreased appetite, and "pain all over.".  She fell about 6 weeks ago but did not seem to get hurt.  Today is Wednesday- current sx seemed to start on Monday.  She has complaint of chest pain, abdominal pain, back pain and has not seemed like herself.   He has not noted any fever, vomiting or diarrhea, or cough.   History of CVA and multiple other medical issues as listed below.    Patient Active Problem List   Diagnosis Date Noted  . Unspecified vitamin D deficiency 12/19/2012  . Dysarthria due to cerebrovascular accident 12/12/2012  . Gait instability 12/12/2012  . Contracture of joint 12/12/2012  . Hemiplegia affecting dominant side, late effect of cerebrovascular disease 12/12/2012  . Anticoagulated on Coumadin 09/19/2011  . Cardiomyopathy in other diseases classified elsewhere 08/24/2011  . (L) MCA cardio embolic stroke  12/45/8099  . Dyslipidemia 06/25/2011  . H/O tobacco use 06/25/2011  . Weakness 05/28/2011  . History of CVA (cerebrovascular accident) 05/28/2011  . HTN (hypertension) 05/28/2011  . HLD (hyperlipidemia) 05/28/2011  . Seizure disorder 05/28/2011    Past Medical History  Diagnosis Date  . CVA (cerebral infarction)   . Hypertension   . Seizures   . Hyperlipidemia   . Hemiparesis   . CHF (congestive heart failure)     EF 15-20% as of 05/28/11 (Dr [facility] summary)  . Vitamin D deficiency 2010  .  Adenomatous polyp of colon 09/2012    repeat colonoscopy in 5 years by Dr. Sharlett Iles    Past Surgical History  Procedure Laterality Date  . Hip surgery    . Fracture surgery    . Abdominal hysterectomy    . Tubal ligation      History  Substance Use Topics  . Smoking status: Former Smoker    Types: Cigarettes    Quit date: 09/26/2007  . Smokeless tobacco: Never Used  . Alcohol Use: No    Family History  Problem Relation Age of Onset  . Diabetes Daughter   . Colon cancer Maternal Grandfather     No Known Allergies  Medication list has been reviewed and updated.  Current Outpatient Prescriptions on File Prior to Visit  Medication Sig Dispense Refill  . baclofen (LIORESAL) 20 MG tablet Take 1 tablet (20 mg total) by mouth 3 (three) times daily as needed for muscle spasms.  90 each  0  . carvedilol (COREG) 6.25 MG tablet Take 1 tablet (6.25 mg total) by mouth 2 (two) times daily with a meal.  180 tablet  2  . digoxin (LANOXIN) 0.25 MG tablet Take 1 tablet (0.25 mg total) by mouth daily.  90 tablet  2  . levETIRAcetam (KEPPRA) 500 MG tablet Take 1 tablet (500 mg total) by mouth 2 (two) times daily.  180 tablet  2  . lisinopril (PRINIVIL,ZESTRIL) 10 MG tablet Take 1 tablet (10  mg total) by mouth daily.  90 tablet  2  . pravastatin (PRAVACHOL) 80 MG tablet Take 1 tablet (80 mg total) by mouth daily.  90 tablet  2  . rosuvastatin (CRESTOR) 20 MG tablet Take 20 mg by mouth daily.      Marland Kitchen warfarin (COUMADIN) 5 MG tablet TAKE AS DIRECTED BY COUMADIN CLINIC.  90 tablet  1  . polyethylene glycol powder (GLYCOLAX/MIRALAX) powder Take 17 g by mouth daily.  3350 g  1   No current facility-administered medications on file prior to visit.    Review of Systems:  As per HPI- otherwise negative.  Physical Examination: Filed Vitals:   08/19/13 1003  BP: 126/84  Pulse: 67  Temp: 98.1 F (36.7 C)  Resp: 16   Filed Vitals:   08/19/13 1003  Weight: 119 lb 9.6 oz (54.25 kg)   Body  mass index is 19.31 kg/(m^2). Ideal Body Weight:    GEN: WDWN, NAD, Non-toxic,  Spasticity of right upper limb and wears orthoses RUE and RLE.  S/p sigfnicant stroke with severe aphasia.  I am not able to decipher her speech but her husband can understand some.  She resists exam somewhat due to pain.  Through gestures and vocalizations she indicates pain in her ears, neck, arms, trunk, chest, back and abdomen.   HEENT: Atraumatic, Normocephalic. Neck supple. No masses, No LAD. Bilateral TM wnl, oropharynx normal.  PEERL,EOMI.  Poor dentition Ears and Nose: No external deformity. CV: RRR, No M/G/R. No JVD. No thrill. No extra heart sounds. Candida rash under right breast.   PULM: CTA B, no wheezes, crackles, rhonchi. No retractions. No resp. distress. No accessory muscle use. ABD: S, NT, ND, +BS. No rebound. No HSM. She indicates pain under her breasts- left more than right, and around her back EXTR: No c/c/e NEURO Normal gait.  PSYCH: Normally interactive per her baseline.  She seems upset and is sometimes tearful.   EKG: she has a right BBB which limits interpretation but does suggest possible ischemia.   Assessment and Plan: Chest pain, unspecified chest pain type - Plan: EKG 12-Lead  Total body pain - Plan: POCT CBC  Aphasia  [name] is here today with complaint of pain all over her body, including her left sided chest.  Unfortunately her aphasia severely limits our ability to obtain a history or to gain information from the physical pain (if pain is reproducible, etc).  The only safe option is to transfer her to the ED where further testing will help to rule- out any dangerous condition.   Called EMS for transport.      Signed Lamar Blinks, MD

## 2013-10-05 ENCOUNTER — Other Ambulatory Visit: Payer: Self-pay | Admitting: Family Medicine

## 2013-10-08 ENCOUNTER — Telehealth: Payer: Self-pay

## 2013-10-08 NOTE — Telephone Encounter (Signed)
Patient requesting all of her medications to now be written from a 30 day supply to a 90 day supply. Per patient with [facility] mail order pharmacy she can get them for the same price. Patient requesting the following medications "Digoxin 0.25", "Levetiracetam 500mg ", Pravastatin 80mg ", "Lisinopril 10mg ", "Carvedilol 6.25mg " and "Warfarin 5mg ". Patients call back number is [phone]

## 2013-10-13 NOTE — Telephone Encounter (Signed)
Pt rxs sent in to mail order for 30 day supply due to needed follow up appt. LM  For pt to rtn call to schedule an appt

## 2013-10-23 ENCOUNTER — Ambulatory Visit (INDEPENDENT_AMBULATORY_CARE_PROVIDER_SITE_OTHER): Payer: Commercial Managed Care - HMO

## 2013-10-23 ENCOUNTER — Ambulatory Visit (INDEPENDENT_AMBULATORY_CARE_PROVIDER_SITE_OTHER): Payer: Medicare HMO | Admitting: Family Medicine

## 2013-10-23 ENCOUNTER — Encounter: Payer: Self-pay | Admitting: Family Medicine

## 2013-10-23 VITALS — BP 152/64 | HR 63 | Temp 97.9°F | Resp 16 | Ht 67.0 in | Wt 119.4 lb

## 2013-10-23 DIAGNOSIS — R2681 Unsteadiness on feet: Secondary | ICD-10-CM

## 2013-10-23 DIAGNOSIS — E785 Hyperlipidemia, unspecified: Secondary | ICD-10-CM

## 2013-10-23 DIAGNOSIS — G8190 Hemiplegia, unspecified affecting unspecified side: Secondary | ICD-10-CM

## 2013-10-23 DIAGNOSIS — I69959 Hemiplegia and hemiparesis following unspecified cerebrovascular disease affecting unspecified side: Secondary | ICD-10-CM

## 2013-10-23 DIAGNOSIS — Z8701 Personal history of pneumonia (recurrent): Secondary | ICD-10-CM

## 2013-10-23 DIAGNOSIS — Z23 Encounter for immunization: Secondary | ICD-10-CM

## 2013-10-23 DIAGNOSIS — Z79899 Other long term (current) drug therapy: Secondary | ICD-10-CM

## 2013-10-23 DIAGNOSIS — Z8673 Personal history of transient ischemic attack (TIA), and cerebral infarction without residual deficits: Secondary | ICD-10-CM

## 2013-10-23 DIAGNOSIS — I10 Essential (primary) hypertension: Secondary | ICD-10-CM

## 2013-10-23 DIAGNOSIS — E559 Vitamin D deficiency, unspecified: Secondary | ICD-10-CM

## 2013-10-23 LAB — TSH: TSH: 1.846 u[IU]/mL (ref 0.350–4.500)

## 2013-10-23 LAB — CBC WITH DIFFERENTIAL/PLATELET
BASOS ABS: 0.1 10*3/uL (ref 0.0–0.1)
Basophils Relative: 1 % (ref 0–1)
Eosinophils Absolute: 0.1 10*3/uL (ref 0.0–0.7)
Eosinophils Relative: 1 % (ref 0–5)
HCT: 38.7 % (ref 36.0–46.0)
Hemoglobin: 13.1 g/dL (ref 12.0–15.0)
LYMPHS PCT: 33 % (ref 12–46)
Lymphs Abs: 3.5 10*3/uL (ref 0.7–4.0)
MCH: 27.7 pg (ref 26.0–34.0)
MCHC: 33.9 g/dL (ref 30.0–36.0)
MCV: 81.8 fL (ref 78.0–100.0)
MONO ABS: 0.6 10*3/uL (ref 0.1–1.0)
MONOS PCT: 6 % (ref 3–12)
Neutro Abs: 6.2 10*3/uL (ref 1.7–7.7)
Neutrophils Relative %: 59 % (ref 43–77)
Platelets: 377 10*3/uL (ref 150–400)
RBC: 4.73 MIL/uL (ref 3.87–5.11)
RDW: 14.3 % (ref 11.5–15.5)
WBC: 10.5 10*3/uL (ref 4.0–10.5)

## 2013-10-23 LAB — COMPLETE METABOLIC PANEL WITH GFR
ALBUMIN: 4.9 g/dL (ref 3.5–5.2)
ALT: 10 U/L (ref 0–35)
AST: 21 U/L (ref 0–37)
Alkaline Phosphatase: 103 U/L (ref 39–117)
BUN: 17 mg/dL (ref 6–23)
CO2: 27 mEq/L (ref 19–32)
Calcium: 9.9 mg/dL (ref 8.4–10.5)
Chloride: 100 mEq/L (ref 96–112)
Creat: 0.79 mg/dL (ref 0.50–1.10)
GFR, Est African American: >89 mL/min
GFR, Est Non African American: 79 mL/min
Glucose, Bld: 82 mg/dL (ref 70–99)
POTASSIUM: 4.9 meq/L (ref 3.5–5.3)
SODIUM: 139 meq/L (ref 135–145)
Total Bilirubin: 0.4 mg/dL (ref 0.2–1.2)
Total Protein: 8.1 g/dL (ref 6.0–8.3)

## 2013-10-23 LAB — LIPID PANEL
CHOL/HDL RATIO: 4.8 ratio
CHOLESTEROL: 202 mg/dL — AB (ref 0–200)
HDL: 42 mg/dL (ref >39)
LDL Cholesterol: 116 mg/dL — ABNORMAL HIGH (ref 0–99)
Triglycerides: 221 mg/dL — ABNORMAL HIGH (ref <150)
VLDL: 44 mg/dL — AB (ref 0–40)

## 2013-10-23 MED ORDER — LEVETIRACETAM 500 MG PO TABS: 500.0000 mg | ORAL_TABLET | Freq: Two times a day (BID) | ORAL | Status: DC

## 2013-10-23 MED ORDER — BACLOFEN 20 MG PO TABS: 20.0000 mg | ORAL_TABLET | Freq: Three times a day (TID) | ORAL | Status: DC | PRN

## 2013-10-23 MED ORDER — CARVEDILOL 6.25 MG PO TABS: 6.2500 mg | ORAL_TABLET | Freq: Two times a day (BID) | ORAL | Status: DC

## 2013-10-23 MED ORDER — LISINOPRIL 10 MG PO TABS: 10.0000 mg | ORAL_TABLET | Freq: Every day | ORAL | Status: DC

## 2013-10-23 MED ORDER — DIGOXIN 250 MCG PO TABS: 0.2500 mg | ORAL_TABLET | Freq: Every day | ORAL | Status: DC

## 2013-10-23 MED ORDER — PRAVASTATIN SODIUM 80 MG PO TABS: 80.0000 mg | ORAL_TABLET | Freq: Every day | ORAL | Status: DC

## 2013-10-23 MED ORDER — ZOSTER VACCINE LIVE 19400 UNT/0.65ML ~~LOC~~ SOLR: 0.6500 mL | Freq: Once | SUBCUTANEOUS | Status: DC

## 2013-10-23 NOTE — Patient Instructions (Signed)
Medicare should pay for a home health aide to help with activities of daily living- normally they will cover a certain amount of hours per year (like 120 hours).  Please contact your [employer] to find out about this and I will try to look into it to.  There will be papers that I have to complete and sign but am happy to do this.  Also, looking through DSS to find out more about the community assistance program which should help provide similar services - there is a waiting list for this but it would be good to at least get you on it.  Vitamin K Foods and Warfarin Warfarin is a medicine that helps prevent harmful blood clots by causing blood to clot more slowly. It does this by decreasing the activity of vitamin K, which promotes normal blood clotting. For the dose of warfarin you have been prescribed to work well, you need to get about the same amount of vitamin K from your food from day to day. Suddenly getting a lot more vitamin K could cause your blood to clot too quickly. A sudden decrease in vitamin K intake could cause your blood to clot too slowly. These changes in vitamin K intake could lead to dangerous blood clotsor to bleeding. WHAT GENERAL GUIDELINES DO I NEED TO FOLLOW?  Keep your intake of vitamin K consistent from day to day. To do this, you must be aware of which foods contain moderate or high amounts of vitamin K. Listed below are some foods that are very high, high, or moderately high in vitamin K. If you eat these foods, make sure you eat a consistent amount of them from day to day.  Avoid major changes in your diet, or tell your health care provider before changing your diet.  If you take a multivitamin that contains vitamin K, be sure to take it every day.  If you drink green tea, drink the same amount each day. WHAT FOODS ARE VERY HIGH IN VITAMIN K?   Greens, such as Swiss chard and beet, collard, mustard, or turnip greens (fresh or frozen, cooked).  Kale (fresh or  frozen, cooked).   Parsley (raw).  Spinach (cooked).  WHAT FOODS ARE HIGH IN VITAMIN K?  Asparagus (frozen, cooked).  Beans, green (frozen, cooked).  Broccoli.   Bok choy (cooked).   Brussels sprouts (fresh or frozen, cooked).  Cabbage (cooked).  Coleslaw. WHAT FOODS ARE MODERATELY HIGH IN VITAMIN K?  Blueberries.  Black-eyed peas.  Endive (raw).   Green leaf lettuce (raw).   Green scallions (raw).  Kale (raw).  Okra (frozen, cooked).  Plantains (fried).  Romaine lettuce (raw).   Sauerkraut (canned).   Spinach (raw). Document Released: 11/05/2008 Document Revised: 01/13/2013 Document Reviewed: 11/12/2012 [facility] Patient Information 2015 [place], [place]. This information is not intended to replace advice given to you by your health care provider. Make sure you discuss any questions you have with your health care provider.  DASH Eating Plan DASH stands for "Dietary Approaches to Stop Hypertension." The DASH eating plan is a healthy eating plan that has been shown to reduce high blood pressure (hypertension). Additional health benefits may include reducing the risk of type 2 diabetes mellitus, heart disease, and stroke. The DASH eating plan may also help with weight loss. WHAT DO I NEED TO KNOW ABOUT THE DASH EATING PLAN? For the DASH eating plan, you will follow these general guidelines:  Choose foods with a percent daily value for sodium of less than  5% (as listed on the food label).  Use salt-free seasonings or herbs instead of table salt or sea salt.  Check with your health care provider or pharmacist before using salt substitutes.  Eat lower-sodium products, often labeled as "lower sodium" or "no salt added."  Eat fresh foods.  Eat more vegetables, fruits, and low-fat dairy products.  Choose whole grains. Look for the word "whole" as the first word in the ingredient list.  Choose fish and skinless chicken or [place] more often than red  meat. Limit fish, poultry, and meat to 6 oz (170 g) each day.  Limit sweets, desserts, sugars, and sugary drinks.  Choose heart-healthy fats.  Limit cheese to 1 oz (28 g) per day.  Eat more home-cooked food and less restaurant, buffet, and fast food.  Limit fried foods.  Cook foods using methods other than frying.  Limit canned vegetables. If you do use them, rinse them well to decrease the sodium.  When eating at a restaurant, ask that your food be prepared with less salt, or no salt if possible. WHAT FOODS CAN I EAT? Seek help from a dietitian for individual calorie needs. Grains Whole grain or whole wheat bread. Brown rice. Whole grain or whole wheat pasta. Quinoa, bulgur, and whole grain cereals. Low-sodium cereals. Corn or whole wheat flour tortillas. Whole grain cornbread. Whole grain crackers. Low-sodium crackers. Vegetables Fresh or frozen vegetables (raw, steamed, roasted, or grilled). Low-sodium or reduced-sodium tomato and vegetable juices. Low-sodium or reduced-sodium tomato sauce and paste. Low-sodium or reduced-sodium canned vegetables.  Fruits All fresh, canned (in natural juice), or frozen fruits. Meat and Other Protein Products Ground beef (85% or leaner), grass-fed beef, or beef trimmed of fat. Skinless chicken or [place]. Ground chicken or [place]. Pork trimmed of fat. All fish and seafood. Eggs. Dried beans, peas, or lentils. Unsalted nuts and seeds. Unsalted canned beans. Dairy Low-fat dairy products, such as skim or 1% milk, 2% or reduced-fat cheeses, low-fat ricotta or cottage cheese, or plain low-fat yogurt. Low-sodium or reduced-sodium cheeses. Fats and Oils Tub margarines without trans fats. Light or reduced-fat mayonnaise and salad dressings (reduced sodium). Avocado. Safflower, olive, or canola oils. Natural peanut or almond butter. Other Unsalted popcorn and pretzels. The items listed above may not be a complete list of recommended foods or beverages.  Contact your dietitian for more options. WHAT FOODS ARE NOT RECOMMENDED? Grains White bread. White pasta. White rice. Refined cornbread. Bagels and croissants. Crackers that contain trans fat. Vegetables Creamed or fried vegetables. Vegetables in a cheese sauce. Regular canned vegetables. Regular canned tomato sauce and paste. Regular tomato and vegetable juices. Fruits Dried fruits. Canned fruit in light or heavy syrup. Fruit juice. Meat and Other Protein Products Fatty cuts of meat. Ribs, chicken wings, bacon, sausage, bologna, salami, chitterlings, fatback, hot dogs, bratwurst, and packaged luncheon meats. Salted nuts and seeds. Canned beans with salt. Dairy Whole or 2% milk, cream, half-and-half, and cream cheese. Whole-fat or sweetened yogurt. Full-fat cheeses or blue cheese. Nondairy creamers and whipped toppings. Processed cheese, cheese spreads, or cheese curds. Condiments Onion and garlic salt, seasoned salt, table salt, and sea salt. Canned and packaged gravies. Worcestershire sauce. Tartar sauce. Barbecue sauce. Teriyaki sauce. Soy sauce, including reduced sodium. Steak sauce. Fish sauce. Oyster sauce. Cocktail sauce. Horseradish. Ketchup and mustard. Meat flavorings and tenderizers. Bouillon cubes. Hot sauce. Tabasco sauce. Marinades. Taco seasonings. Relishes. Fats and Oils Butter, stick margarine, lard, shortening, ghee, and bacon fat. Coconut, palm kernel, or palm oils. Regular salad  dressings. Other Pickles and olives. Salted popcorn and pretzels. The items listed above may not be a complete list of foods and beverages to avoid. Contact your dietitian for more information. WHERE CAN I FIND MORE INFORMATION? National Heart, Lung, and Blood Institute: travelstabloid.com Document Released: 12/28/2010 Document Revised: 05/25/2013 Document Reviewed: 11/12/2012 [facility] Patient Information 2015 [place], [place]. This information is not intended to  replace advice given to you by your health care provider. Make sure you discuss any questions you have with your health care provider.

## 2013-10-23 NOTE — Progress Notes (Signed)
[facility] reading (PRIMARY) by  Dr. Brigitte Pulse.  CXR: no infiltrate, some bibasilar atelectasis. Mediastinum shift towards right due to hemiplegia.  EXAM: CHEST 2 VIEW  COMPARISON: PA and lateral chest x-ray of August 19, 2013  FINDINGS: The lungs are adequately inflated. There is no focal infiltrate. The lung bases have improved in appearance since the previous study. There is no pleural effusion or pneumothorax. The heart and mediastinal structures are normal. The bony thorax exhibits no acute abnormality.  IMPRESSION: There is no evidence of pneumonia nor other acute cardiopulmonary abnormality.  Need for prophylactic vaccination and inoculation against influenza - Plan: Flu Vaccine QUAD 36+ mos IM  History of pneumonia - Plan: DG Chest 2 View, CBC with Differential  Essential hypertension  Hemiplegia of dominant side as late effect following cerebrovascular disease  HLD (hyperlipidemia) - Plan: Lipid panel - continues to be above goal - change from pravastatin 80 to atorvastatin 80. Recheck in 4-6 mos.  History of CVA (cerebrovascular accident) - Plan: Lipid panel  Vitamin D deficiency - Plan: Vitamin D, 25-hydroxy  Gait instability - Plan: TSH  Encounter for long-term (current) use of medications - Plan: COMPLETE METABOLIC PANEL WITH GFR, CBC with Differential  Need for zoster vaccine - Plan: zoster vaccine live, PF, (ZOSTAVAX) 49449 UNT/0.65ML injection  Meds ordered this encounter  Medications  . carvedilol (COREG) 6.25 MG tablet    Sig: Take 1 tablet (6.25 mg total) by mouth 2 (two) times daily.    Dispense:  180 tablet    Refill:  1    Order Specific Question:  Supervising Provider    Answer:  Carlota Raspberry, JEFFREY R [[id number]]  . digoxin (LANOXIN) 0.25 MG tablet    Sig: Take 1 tablet (0.25 mg total) by mouth daily.    Dispense:  90 tablet    Refill:  1    Order Specific Question:  Supervising Provider    Answer:  Carlota Raspberry, JEFFREY R [[id number]]  . levETIRAcetam (KEPPRA) 500 MG tablet     Sig: Take 1 tablet (500 mg total) by mouth 2 (two) times daily.    Dispense:  180 tablet    Refill:  1    Order Specific Question:  Supervising Provider    Answer:  Carlota Raspberry, JEFFREY R [[id number]]  . lisinopril (PRINIVIL,ZESTRIL) 10 MG tablet    Sig: Take 1 tablet (10 mg total) by mouth daily.    Dispense:  90 tablet    Refill:  1    Order Specific Question:  Supervising Provider    Answer:  Carlota Raspberry, JEFFREY R [[id number]]  . pravastatin (PRAVACHOL) 80 MG tablet    Sig: Take 1 tablet (80 mg total) by mouth daily.    Dispense:  90 tablet    Refill:  1    Order Specific Question:  Supervising Provider    Answer:  Carlota Raspberry, JEFFREY R [[id number]]  . baclofen (LIORESAL) 20 MG tablet    Sig: Take 1 tablet (20 mg total) by mouth 3 (three) times daily as needed for muscle spasms.    Dispense:  270 each    Refill:  1    Order Specific Question:  Supervising Provider    Answer:  Carlota Raspberry, JEFFREY R [[id number]]  . zoster vaccine live, PF, (ZOSTAVAX) 67591 UNT/0.65ML injection    Sig: Inject 19,400 Units into the skin once.    Dispense:  1 vial    Refill:  0     Delman Cheadle, MD MPH

## 2013-10-23 NOTE — Progress Notes (Signed)
IDENTIFYING INFORMATION  [name] / female / Jun 09, 1950 / 63 y.o. / MRN: [id number]  SUBJECTIVE  Chief Complaint: had a chief complaint of 3 month check up.  History of present illness:  [name] has been doing generally well over the last three months and she, and her husband who is present, are primarily concerned with medication refills.  They are in need of refilling Coreg, Lanoxin, Keppra, Prinivil and Pravachol. They request 90 supplies.    [name] does complain of pain in the right upper and lower extremity, the area where she historically suffers from spasticity.  Baclofen has worked well in the past and she requests a refill of that medication at this time.    She reports feeling depressed as of late, mostly 2/2 her inability to communicate with others.  However, she denies anhedonia, sleep disturbance or difficulty, appetite changes, suicidality, as well as changes in appetite.    She has a history of falls, however, husband denies any falling incidents in the last three months.    She is followed at an outside clinic for PT/INR, and husband reports that the next appoint is on 10/26/13.    She has not been immunized against the seasonal flu, and is agreeable to the annual flu vaccine today.    The problem list, allergies, medications, family, surgical, and social history were reviewed by me and exist elsewhere in the encounter.    Review of Systems  Constitutional: Negative.   HENT: Positive for ear pain and hearing loss. Negative for congestion and sore throat.   Eyes: Negative.   Respiratory: Negative.   Cardiovascular: Negative.  Negative for leg swelling and PND.  Gastrointestinal: Positive for constipation.  Genitourinary: Negative.   Musculoskeletal: Positive for myalgias.  Skin: Negative.   Endo/Heme/Allergies: Negative.     OBJECTIVE  Blood pressure 152/64, pulse 63, temperature 97.9 F (36.6 C), temperature source Oral, resp.  rate 16, height 5\' 7"  (1.702 m), weight 119 lb 6.4 oz (54.159 kg), SpO2 96.00%.  Physical Exam  Constitutional: She is oriented to person, place, and time and well-developed, well-nourished, and in no distress. No distress.  HENT:  Head: Normocephalic.  Right Ear: Decreased hearing is noted.  Left Ear: Tympanic membrane and ear canal normal. No decreased hearing is noted.  Copious cerumen buildup noted in R ear canal.   Eyes: EOM are normal. Pupils are equal, round, and reactive to light. No scleral icterus.  Neck: Normal range of motion. No thyromegaly present.  Cardiovascular: Normal rate, regular rhythm, normal heart sounds and intact distal pulses.   Pulmonary/Chest: Effort normal and breath sounds normal. No respiratory distress. She has no wheezes. She has no rales.  Abdominal: Soft. Bowel sounds are normal. She exhibits no distension and no mass. There is no guarding.  Lymphadenopathy:    She has no cervical adenopathy.  Neurological: She is alert and oriented to person, place, and time. She is not disoriented. She displays weakness (Not new), facial asymmetry (Not new) and abnormal speech (Not new.). She displays no tremor. She exhibits abnormal muscle tone (Spasticity in the right upper and lower extremity.  Not new.). Gait (Not new) abnormal.  Skin: Skin is warm and dry. No rash noted. She is not diaphoretic. No pallor.  Psychiatric: Affect normal. Her affect is not inappropriate. She is not agitated. She exhibits a depressed mood. She expresses no suicidal ideation. She expresses no suicidal plans and no homicidal plans. She is not  apathetic. She does not have a flat affect.    ASSESSMENT & PLAN  Need for prophylactic vaccination and inoculation against influenza - Plan: Flu Vaccine QUAD 36+ mos IM  Medications refilled:  Meds ordered this encounter  Medications  . carvedilol (COREG) 6.25 MG tablet    Sig: Take 1 tablet (6.25 mg total) by mouth 2 (two) times daily.     Dispense:  180 tablet    Refill:  1    Order Specific Question:  Supervising Provider    Answer:  Carlota Raspberry, JEFFREY R [[id number]]  . digoxin (LANOXIN) 0.25 MG tablet    Sig: Take 1 tablet (0.25 mg total) by mouth daily.    Dispense:  90 tablet    Refill:  1    Order Specific Question:  Supervising Provider    Answer:  Carlota Raspberry, JEFFREY R [[id number]]  . levETIRAcetam (KEPPRA) 500 MG tablet    Sig: Take 1 tablet (500 mg total) by mouth 2 (two) times daily.    Dispense:  180 tablet    Refill:  1    Order Specific Question:  Supervising Provider    Answer:  Carlota Raspberry, JEFFREY R [[id number]]  . lisinopril (PRINIVIL,ZESTRIL) 10 MG tablet    Sig: Take 1 tablet (10 mg total) by mouth daily.    Dispense:  90 tablet    Refill:  1    Order Specific Question:  Supervising Provider    Answer:  Carlota Raspberry, JEFFREY R [[id number]]  . pravastatin (PRAVACHOL) 80 MG tablet    Sig: Take 1 tablet (80 mg total) by mouth daily.    Dispense:  90 tablet    Refill:  1    Order Specific Question:  Supervising Provider    Answer:  Carlota Raspberry, JEFFREY R [[id number]]  . baclofen (LIORESAL) 20 MG tablet    Sig: Take 1 tablet (20 mg total) by mouth 3 (three) times daily as needed for muscle spasms.    Dispense:  270 each    Refill:  1    Order Specific Question:  Supervising Provider    Answer:  Carlota Raspberry, JEFFREY R [[id number]]  . zoster vaccine live, PF, (ZOSTAVAX) 06237 UNT/0.65ML injection    Sig: Inject 19,400 Units into the skin once.    Dispense:  1 vial    Refill:  0   Need for Zoster Vaccination: Zostavax 194000 Unt/0.65ML injection prescription written.  History of pneumonia - Plan: DG Chest 2 View, CBC with Differential.    Essential hypertension: Stable, continue current plan.   Hemiplegia of dominant side as late effect following cerebrovascular disease: Pt to continue her personal training session twice weekly.   HLD (hyperlipidemia) - Plan: Lipid panel, will plan to continue statin medication.  History of CVA (cerebrovascular accident)  - Plan: Lipid panel.  Will plan to continue BP and statin medication.   Vitamin D deficiency - Plan: Vitamin D, 25-hydroxy.  F/U with patient should results come back low.  Likely to begin or continue Vitamin D with Calcium supplementation.     Gait instability - Plan: TSH.  Pt to continu her personal training sessions twice weekly.   Encounter for long-term (current) use of medications - Plan: COMPLETE METABOLIC PANEL WITH GFR, CBC with Differential  The patient was instructed to to call or comeback to clinic as needed, or should symptoms warrant.  Philis Fendt, MS, PA-C [facility] 10/23/2013 12:53 PM

## 2013-10-24 LAB — VITAMIN D 25 HYDROXY (VIT D DEFICIENCY, FRACTURES): Vit D, 25-Hydroxy: 37 ng/mL (ref 30–89)

## 2013-10-27 ENCOUNTER — Encounter: Payer: Self-pay | Admitting: Family Medicine

## 2013-10-27 MED ORDER — ATORVASTATIN CALCIUM 80 MG PO TABS: 80.0000 mg | ORAL_TABLET | Freq: Every day | ORAL | Status: DC

## 2013-11-02 ENCOUNTER — Telehealth: Payer: Self-pay

## 2013-11-02 NOTE — Telephone Encounter (Signed)
Patient has questions concerning the prescriptions he picked up at the pharmacy   prasvatan 80 mg and lipitor both.    Does she take both, did the pharmacy make a mistake   [phone]  alonza

## 2013-11-19 ENCOUNTER — Ambulatory Visit (INDEPENDENT_AMBULATORY_CARE_PROVIDER_SITE_OTHER): Payer: Commercial Managed Care - HMO | Admitting: Family Medicine

## 2013-11-19 VITALS — BP 160/100 | HR 57 | Temp 98.3°F | Resp 16 | Ht 66.0 in | Wt 120.0 lb

## 2013-11-19 DIAGNOSIS — Z8673 Personal history of transient ischemic attack (TIA), and cerebral infarction without residual deficits: Secondary | ICD-10-CM

## 2013-11-19 DIAGNOSIS — L03032 Cellulitis of left toe: Secondary | ICD-10-CM

## 2013-11-19 DIAGNOSIS — L600 Ingrowing nail: Secondary | ICD-10-CM

## 2013-11-19 DIAGNOSIS — D699 Hemorrhagic condition, unspecified: Secondary | ICD-10-CM

## 2013-11-19 MED ORDER — DOXYCYCLINE HYCLATE 100 MG PO TABS: 100.0000 mg | ORAL_TABLET | Freq: Two times a day (BID) | ORAL | Status: DC

## 2013-11-19 NOTE — Addendum Note (Signed)
Addended by: Merri Ray R on: 11/19/2013 10:49 AM   Modules accepted: Orders, Medications

## 2013-11-19 NOTE — Patient Instructions (Signed)
Stop Keflex, start doxycycline to cover for other types of infection.  We will have you evaluated by podiatry after you leave here .  You may need to have the toenail removed as well as your toe appears infected.  I will change your antibiotic to doxycycline to cover for other types of infection, and sent a wound culture.  Call the practice managing your coumadin and let them know the antibiotic you took last week and that you were started on doxycycline today for the infection as it may change your blood thinner dose.   Cellulitis Cellulitis is an infection of the skin and the tissue beneath it. The infected area is usually red and tender. Cellulitis occurs most often in the arms and lower legs.  CAUSES  Cellulitis is caused by bacteria that enter the skin through cracks or cuts in the skin. The most common types of bacteria that cause cellulitis are staphylococci and streptococci. SIGNS AND SYMPTOMS   Redness and warmth.  Swelling.  Tenderness or pain.  Fever. DIAGNOSIS  Your health care provider can usually determine what is wrong based on a physical exam. Blood tests may also be done. TREATMENT  Treatment usually involves taking an antibiotic medicine. HOME CARE INSTRUCTIONS   Take your antibiotic medicine as directed by your health care provider. Finish the antibiotic even if you start to feel better.  Keep the infected arm or leg elevated to reduce swelling.  Apply a warm cloth to the affected area up to 4 times per day to relieve pain.  Take medicines only as directed by your health care provider.  Keep all follow-up visits as directed by your health care provider. SEEK MEDICAL CARE IF:   You notice red streaks coming from the infected area.  Your red area gets larger or turns dark in color.  Your bone or joint underneath the infected area becomes painful after the skin has healed.  Your infection returns in the same area or another area.  You notice a swollen bump in  the infected area.  You develop new symptoms.  You have a fever. SEEK IMMEDIATE MEDICAL CARE IF:   You feel very sleepy.  You develop vomiting or diarrhea.  You have a general ill feeling (malaise) with muscle aches and pains. MAKE SURE YOU:   Understand these instructions.  Will watch your condition.  Will get help right away if you are not doing well or get worse. Document Released: 10/18/2004 Document Revised: 05/25/2013 Document Reviewed: 03/26/2011 [facility] Patient Information 2015 [place], [place]. This information is not intended to replace advice given to you by your health care provider. Make sure you discuss any questions you have with your health care provider.

## 2013-11-19 NOTE — Progress Notes (Signed)
Subjective:    Patient ID: [name], female    DOB: 1950/03/23, 63 y.o.   MRN: [id number]  HPI [name] is a 63 y.o. female  Hx of multiple medical problems including HTN, Hx of CVA, with residual dysarthria and hemiplegia, on Coumadin.   Grandbaby stepped on L great toe about 3 weeks ago when visiting family in [place]. Seen 1 week ago in [place], [place] at urgent care. Started on Keflex 500mg  TID, and Norco for cellulitis of toe and ingrown toenail. Referred to podiatrist, went there 2 days ago - [facility], unable to be seen there as needed referral form primary care. PCP Dr. Brigitte Pulse at our office.   Had been doing ok for a few days, but more sore since last night. R 2nd toe also ingrown and bleeding some now.  Wears AFO brace/orthotic on R foot (residual R sided weakness as above).   Hx of HTN - has not taken meds yet today. Repeat BP 140/70.    Patient Active Problem List   Diagnosis Date Noted  . Vitamin D deficiency 12/19/2012  . Dysarthria due to cerebrovascular accident 12/12/2012  . Gait instability 12/12/2012  . Contracture of joint 12/12/2012  . Hemiplegia of dominant side as late effect following cerebrovascular disease 12/12/2012  . Anticoagulated on Coumadin 09/19/2011  . Cardiomyopathy in other diseases classified elsewhere 08/24/2011  . (L) MCA cardio embolic stroke  63/84/5364  . Dyslipidemia 06/25/2011  . H/O tobacco use 06/25/2011  . Weakness 05/28/2011  . History of CVA (cerebrovascular accident) 05/28/2011  . HTN (hypertension) 05/28/2011  . HLD (hyperlipidemia) 05/28/2011  . Seizure disorder 05/28/2011   Past Medical History  Diagnosis Date  . CVA (cerebral infarction)   . Hypertension   . Seizures   . Hyperlipidemia   . Hemiparesis   . CHF (congestive heart failure)     EF 15-20% as of 05/28/11 ([facility])  . Vitamin D deficiency 2010  . Adenomatous polyp of colon 09/2012    repeat  colonoscopy in 5 years by Dr. Sharlett Iles  . Stroke    Past Surgical History  Procedure Laterality Date  . Hip surgery    . Fracture surgery    . Abdominal hysterectomy    . Tubal ligation     No Known Allergies Prior to Admission medications   Medication Sig Start Date End Date Taking? Authorizing Provider  atorvastatin (LIPITOR) 80 MG tablet Take 1 tablet (80 mg total) by mouth daily at 6 PM. 10/27/13  Yes [name], MD  baclofen (LIORESAL) 20 MG tablet Take 1 tablet (20 mg total) by mouth 3 (three) times daily as needed for muscle spasms. 10/23/13  Yes [name], MD  carvedilol (COREG) 6.25 MG tablet Take 1 tablet (6.25 mg total) by mouth 2 (two) times daily. 10/23/13  Yes [name], MD  cephALEXin (KEFLEX) 500 MG capsule Take 500 mg by mouth 3 (three) times daily.   Yes Historical Provider, MD  digoxin (LANOXIN) 0.25 MG tablet Take 1 tablet (0.25 mg total) by mouth daily. 10/23/13  Yes [name], MD  HYDROcodone-acetaminophen (NORCO/VICODIN) 5-325 MG per tablet Take 1 tablet by mouth every 4 (four) hours as needed for moderate pain.   Yes Historical Provider, MD  levETIRAcetam (KEPPRA) 500 MG tablet Take 1 tablet (500 mg total) by mouth 2 (two) times daily. 10/23/13  Yes [name], MD  Liniments (HEET TRIPLE ACTION EX) Apply 1 application  topically as needed (for pain).   Yes Historical Provider, MD  lisinopril (PRINIVIL,ZESTRIL) 10 MG tablet Take 1 tablet (10 mg total) by mouth daily. 10/23/13  Yes [name], MD  polyethylene glycol [facility] / GLYCOLAX) packet Take 17 g by mouth daily as needed for moderate constipation.   Yes Historical Provider, MD  warfarin (COUMADIN) 5 MG tablet Take 2.5-5 mg by mouth daily. Take 5mg  by mouth on Sunday, Monday, Wednesday, Thursday and Friday. Take 2.5mg  by mouth on Tuesday.   Yes Historical Provider, MD  zoster vaccine live, PF, (ZOSTAVAX) 91638 UNT/0.65ML injection Inject 19,400 Units into the skin once. 10/23/13  Yes [name], MD   History    Social History  . Marital Status: Married    Spouse Name: N/A    Number of Children: 2  . Years of Education: 12   Occupational History  .     Social History Main Topics  . Smoking status: Former Smoker    Types: Cigarettes    Quit date: 09/26/2007  . Smokeless tobacco: Never Used  . Alcohol Use: No  . Drug Use: No  . Sexual Activity: No   Other Topics Concern  . Not on file   Social History Narrative  . No narrative on file       Review of Systems  Constitutional: Negative for fever.  Musculoskeletal: Positive for arthralgias and joint swelling.  Skin: Positive for color change and wound (L great toe. ).       Objective:   Physical Exam  Vitals reviewed. Constitutional: She is oriented to person, place, and time. She appears well-developed and well-nourished. No distress.  Pulmonary/Chest: Effort normal.  Musculoskeletal:       Feet:  Neurological: She is alert and oriented to person, place, and time.  residual weakness R side, dysarthric.   Skin: There is erythema.  Psychiatric: She has a normal mood and affect. Her behavior is normal.   Filed Vitals:   11/19/13 0845  BP: 160/100  Pulse: 57  Temp: 98.3 F (36.8 C)  TempSrc: Oral  Resp: 16  Height: 5\' 6"  (1.676 m)  Weight: 120 lb (54.432 kg)  SpO2: 97%      Assessment & Plan:   [name] is a 63 y.o. female Ingrown left big toenail - Plan: Wound culture, doxycycline (VIBRA-TABS) 100 MG tablet, Ambulatory referral to Podiatry,Cellulitis of toe of left foot - Plan: doxycycline (VIBRA-TABS) 100 MG tablet, Ambulatory referral to Podiatry  - change to doxycycline, wound culture, refer to podiatry for eval today as appears to not only have ingrown nail, but cellulitis that has persisted on Keflex. May need nail removed, and with underlying bleeding tendency - can have performed at podiatry.    Bleeding tendency - on coumadin - pt to call provider checking INR to advise on Doxycycline as may  affect coumadin doing or INR interval.   History of CVA (cerebrovascular accident).    Meds ordered this encounter  Medications  . cephALEXin (KEFLEX) 500 MG capsule    Sig: Take 500 mg by mouth 3 (three) times daily.  Marland Kitchen HYDROcodone-acetaminophen (NORCO/VICODIN) 5-325 MG per tablet    Sig: Take 1 tablet by mouth every 4 (four) hours as needed for moderate pain.  Marland Kitchen doxycycline (VIBRA-TABS) 100 MG tablet    Sig: Take 1 tablet (100 mg total) by mouth 2 (two) times daily.    Dispense:  20 tablet    Refill:  0   Patient Instructions  Stop Keflex, start doxycycline to cover for other types of infection.  We will have you evaluated by podiatry after you leave here .  You may need to have the toenail removed as well as your toe appears infected.  I will change your antibiotic to doxycycline to cover for other types of infection, and sent a wound culture.  Call the practice managing your coumadin and let them know the antibiotic you took last week and that you were started on doxycycline today for the infection as it may change your blood thinner dose.   Cellulitis Cellulitis is an infection of the skin and the tissue beneath it. The infected area is usually red and tender. Cellulitis occurs most often in the arms and lower legs.  CAUSES  Cellulitis is caused by bacteria that enter the skin through cracks or cuts in the skin. The most common types of bacteria that cause cellulitis are staphylococci and streptococci. SIGNS AND SYMPTOMS   Redness and warmth.  Swelling.  Tenderness or pain.  Fever. DIAGNOSIS  Your health care provider can usually determine what is wrong based on a physical exam. Blood tests may also be done. TREATMENT  Treatment usually involves taking an antibiotic medicine. HOME CARE INSTRUCTIONS   Take your antibiotic medicine as directed by your health care provider. Finish the antibiotic even if you start to feel better.  Keep the infected arm or leg elevated to  reduce swelling.  Apply a warm cloth to the affected area up to 4 times per day to relieve pain.  Take medicines only as directed by your health care provider.  Keep all follow-up visits as directed by your health care provider. SEEK MEDICAL CARE IF:   You notice red streaks coming from the infected area.  Your red area gets larger or turns dark in color.  Your bone or joint underneath the infected area becomes painful after the skin has healed.  Your infection returns in the same area or another area.  You notice a swollen bump in the infected area.  You develop new symptoms.  You have a fever. SEEK IMMEDIATE MEDICAL CARE IF:   You feel very sleepy.  You develop vomiting or diarrhea.  You have a general ill feeling (malaise) with muscle aches and pains. MAKE SURE YOU:   Understand these instructions.  Will watch your condition.  Will get help right away if you are not doing well or get worse. Document Released: 10/18/2004 Document Revised: 05/25/2013 Document Reviewed: 03/26/2011 [facility] Patient Information 2015 [place], [place]. This information is not intended to replace advice given to you by your health care provider. Make sure you discuss any questions you have with your health care provider.

## 2013-11-21 LAB — WOUND CULTURE
Gram Stain: NONE SEEN
Gram Stain: NONE SEEN

## 2014-01-05 ENCOUNTER — Telehealth: Payer: Self-pay | Admitting: Family Medicine

## 2014-01-05 NOTE — Telephone Encounter (Signed)
Spoke to [facility]- he states that in October Dr Brigitte Pulse had referred pt to have a home health aid come to assist her. The referral is not in the system and no mention of this in the OV. Please advise if this was to be ordered.

## 2014-01-05 NOTE — Telephone Encounter (Signed)
I am happy to put in for a home health order for skilled nursing - they had this Jan-March of 2015 through [facility].  If they thought it was helpful then can do again.   What I think they could use more was a home health AIDE - someone to help give a bath, get pt dressed, help with ADLs, etc.  I told them that I think that Medicare should pay for this for a certain amount of hours per year (like 120 hours) but was not sure so recommended they call their insurance to find out if they have this benefit.   If we can put in a referral for an aide that would be great - due to hemiplegia, spacticity, debility, and dysarthria after CVA - but when we put in the standard home health order it always ends up as skilled nursing.   Also told [name] that I thought there was a program run through [facility] that was to keep patients at home instead of in nursing homes where more assistance is provided so recommended he contact DSS to see if this is something for which his wife would be eligible. . . .

## 2014-01-05 NOTE — Telephone Encounter (Signed)
[name] called for patient. States that Dr. Brigitte Pulse put in orders for a home health nurse to assist patient. States that they have provided a fax number if we need to send anything to them. Fax is [phone]. Phone is [phone] ext [phone].  Contact [name] for more information [phone]

## 2014-01-07 ENCOUNTER — Other Ambulatory Visit: Payer: Self-pay

## 2014-01-07 DIAGNOSIS — G8190 Hemiplegia, unspecified affecting unspecified side: Secondary | ICD-10-CM

## 2014-01-07 DIAGNOSIS — R269 Unspecified abnormalities of gait and mobility: Secondary | ICD-10-CM

## 2014-01-07 DIAGNOSIS — G460 Middle cerebral artery syndrome: Secondary | ICD-10-CM

## 2014-01-07 DIAGNOSIS — Z8673 Personal history of transient ischemic attack (TIA), and cerebral infarction without residual deficits: Secondary | ICD-10-CM

## 2014-01-07 DIAGNOSIS — R569 Unspecified convulsions: Secondary | ICD-10-CM

## 2014-01-07 NOTE — Telephone Encounter (Signed)
I have put in the home health care order for skilled nursing and home health aide.

## 2014-01-26 ENCOUNTER — Other Ambulatory Visit: Payer: Self-pay | Admitting: Family Medicine

## 2014-01-27 NOTE — Telephone Encounter (Signed)
Dr Brigitte Pulse, do you manage pt's coumadin, and want to RF?

## 2014-01-28 DIAGNOSIS — Z7901 Long term (current) use of anticoagulants: Secondary | ICD-10-CM | POA: Diagnosis not present

## 2014-01-28 DIAGNOSIS — I639 Cerebral infarction, unspecified: Secondary | ICD-10-CM | POA: Diagnosis not present

## 2014-02-03 ENCOUNTER — Telehealth: Payer: Self-pay | Admitting: Family Medicine

## 2014-02-03 NOTE — Telephone Encounter (Signed)
Patient's husband states that she needs a refill on Coumadin.   [phone] [facility] [place])

## 2014-02-04 NOTE — Telephone Encounter (Signed)
Looks like i refilled the coumadin on 01/29/14 for 90 tabs w/ 1 refill - take as instructed by [facility].  If pharmacy didn't receive for some reason, please resend.

## 2014-02-04 NOTE — Telephone Encounter (Signed)
Left detailed message on vm,  Call back with any further questions

## 2014-02-04 NOTE — Telephone Encounter (Signed)
Please advise 

## 2014-03-11 DIAGNOSIS — Z7901 Long term (current) use of anticoagulants: Secondary | ICD-10-CM | POA: Diagnosis not present

## 2014-03-11 DIAGNOSIS — I639 Cerebral infarction, unspecified: Secondary | ICD-10-CM | POA: Diagnosis not present

## 2014-03-26 ENCOUNTER — Ambulatory Visit (INDEPENDENT_AMBULATORY_CARE_PROVIDER_SITE_OTHER): Payer: Commercial Managed Care - HMO | Admitting: Family Medicine

## 2014-03-26 ENCOUNTER — Encounter: Payer: Self-pay | Admitting: Family Medicine

## 2014-03-26 VITALS — BP 118/63 | HR 63 | Temp 98.2°F | Resp 16

## 2014-03-26 DIAGNOSIS — M79671 Pain in right foot: Secondary | ICD-10-CM

## 2014-03-26 DIAGNOSIS — E785 Hyperlipidemia, unspecified: Secondary | ICD-10-CM

## 2014-03-26 DIAGNOSIS — R252 Cramp and spasm: Secondary | ICD-10-CM

## 2014-03-26 DIAGNOSIS — I69922 Dysarthria following unspecified cerebrovascular disease: Secondary | ICD-10-CM

## 2014-03-26 DIAGNOSIS — R21 Rash and other nonspecific skin eruption: Secondary | ICD-10-CM | POA: Diagnosis not present

## 2014-03-26 DIAGNOSIS — I10 Essential (primary) hypertension: Secondary | ICD-10-CM

## 2014-03-26 DIAGNOSIS — R258 Other abnormal involuntary movements: Secondary | ICD-10-CM

## 2014-03-26 DIAGNOSIS — M79609 Pain in unspecified limb: Secondary | ICD-10-CM | POA: Diagnosis not present

## 2014-03-26 DIAGNOSIS — I69359 Hemiplegia and hemiparesis following cerebral infarction affecting unspecified side: Secondary | ICD-10-CM

## 2014-03-26 LAB — LIPID PANEL
Cholesterol: 143 mg/dL (ref 0–200)
HDL: 39 mg/dL — AB (ref >=46)
LDL CALC: 82 mg/dL (ref 0–99)
TRIGLYCERIDES: 108 mg/dL (ref <150)
Total CHOL/HDL Ratio: 3.7 Ratio
VLDL: 22 mg/dL (ref 0–40)

## 2014-03-26 LAB — SEDIMENTATION RATE: SED RATE: 30 mm/h (ref 0–30)

## 2014-03-26 LAB — COMPLETE METABOLIC PANEL WITH GFR
ALT: 24 U/L (ref 0–35)
AST: 29 U/L (ref 0–37)
Albumin: 4.7 g/dL (ref 3.5–5.2)
Alkaline Phosphatase: 129 U/L — ABNORMAL HIGH (ref 39–117)
BUN: 18 mg/dL (ref 6–23)
CO2: 27 meq/L (ref 19–32)
CREATININE: 0.82 mg/dL (ref 0.50–1.10)
Calcium: 9.8 mg/dL (ref 8.4–10.5)
Chloride: 102 mEq/L (ref 96–112)
GFR, Est African American: 87 mL/min
GFR, Est Non African American: 75 mL/min
Glucose, Bld: 66 mg/dL — ABNORMAL LOW (ref 70–99)
POTASSIUM: 4.5 meq/L (ref 3.5–5.3)
SODIUM: 142 meq/L (ref 135–145)
Total Bilirubin: 0.4 mg/dL (ref 0.2–1.2)
Total Protein: 7.8 g/dL (ref 6.0–8.3)

## 2014-03-26 LAB — URIC ACID: Uric Acid, Serum: 4.9 mg/dL (ref 2.4–7.0)

## 2014-03-26 MED ORDER — LISINOPRIL 10 MG PO TABS: 10.0000 mg | ORAL_TABLET | Freq: Every day | ORAL | Status: DC

## 2014-03-26 MED ORDER — TRIAMCINOLONE ACETONIDE 0.1 % EX CREA: 1.0000 "application " | TOPICAL_CREAM | Freq: Two times a day (BID) | CUTANEOUS | Status: DC

## 2014-03-26 MED ORDER — CARVEDILOL 6.25 MG PO TABS: 6.2500 mg | ORAL_TABLET | Freq: Two times a day (BID) | ORAL | Status: DC

## 2014-03-26 MED ORDER — BACLOFEN 20 MG PO TABS: 20.0000 mg | ORAL_TABLET | Freq: Two times a day (BID) | ORAL | Status: DC

## 2014-03-26 MED ORDER — ATORVASTATIN CALCIUM 80 MG PO TABS: 80.0000 mg | ORAL_TABLET | Freq: Every day | ORAL | Status: DC

## 2014-03-26 MED ORDER — HYDROCODONE-ACETAMINOPHEN 5-325 MG PO TABS: 1.0000 | ORAL_TABLET | Freq: Four times a day (QID) | ORAL | Status: DC | PRN

## 2014-03-26 MED ORDER — LEVETIRACETAM 500 MG PO TABS: 500.0000 mg | ORAL_TABLET | Freq: Two times a day (BID) | ORAL | Status: DC

## 2014-03-26 MED ORDER — DOCUSATE SODIUM 50 MG PO CAPS: 50.0000 mg | ORAL_CAPSULE | Freq: Every day | ORAL | Status: DC

## 2014-03-26 MED ORDER — DIGOXIN 250 MCG PO TABS: 0.2500 mg | ORAL_TABLET | Freq: Every day | ORAL | Status: DC

## 2014-03-26 NOTE — Progress Notes (Addendum)
Subjective:    Patient ID: [name], female    DOB: 08/02/1950, 64 y.o.   MRN: [id number]  This chart was scribed for Valerie Knapp, MD by Stephania Fragmin, ED Scribe. This patient was seen in room 25 and the patient's care was started at 10:15 AM.   HPI  Chief Complaint  Patient presents with  . Hypertension  . Hyperlipidemia  . Foot Pain    This history is provided by the patient and her husband [name] .  HPI Comments: [name] is a 64 y.o. female who presents to the [facility] complaining of hypertension, hyperlipidemia, and right foot pain.   Extemities: Patient had been to the podiatrist for her left foot. She now has a pruritic rash on her right foot, which she noticed today. She denies of pain in the area. However, she does complain of pain on her right great toe. Her husband notes that she works with a physical therapist twice a week.  She hasn't been wearing her right foot splint because she hasn't been going out often, and the Velcro and padding has been wearing out. She denies use of pain medication, what has not been effective. She has been taking baclofen with some relief, but has run out of Vicodin.  Coumadin: Patient had an INR checkup a couple weeks ago, per husband, and she was told she was doing well.   GI: Patient's husband gives her Miralax as needed for constipation.   Psych: Patient reports her mood has been well. Her husband notes that she seemed more active and alert around the house lately, expressing more interest in wanting to clean up and walk around the house. She does seem to have reduced stamina when cooking in the kitchen, which may be due to not going to the gym as often as she used to.  Health Maintenance: Patient reports eating relatively well, with plenty of protein. Her husband cooks for her.   Misc.: Patient's husband states that [facility] has been trying to contact them in regards to setting up a home health aide, but the  process has been difficulty. Patient hasn't eaten yet today.  Past Medical History  Diagnosis Date  . CVA (cerebral infarction)   . Hypertension   . Seizures   . Hyperlipidemia   . Hemiparesis   . CHF (congestive heart failure)     EF 15-20% as of 05/28/11 (Dr [facility] D[facility] summary)  . Vitamin D deficiency 2010  . Adenomatous polyp of colon 09/2012    repeat colonoscopy in 5 years by Dr. Sharlett Iles  . Stroke    Current Outpatient Prescriptions on File Prior to Visit  Medication Sig Dispense Refill  . Liniments (HEET TRIPLE ACTION EX) Apply 1 application topically as needed (for pain).    . polyethylene glycol (MIRALAX / GLYCOLAX) packet Take 17 g by mouth daily as needed for moderate constipation.    Marland Kitchen warfarin (COUMADIN) 5 MG tablet TAKE AS DIRECTED BY COUMADIN CLINIC. 90 tablet 1  . zoster vaccine live, PF, (ZOSTAVAX) 53794 UNT/0.65ML injection Inject 19,400 Units into the skin once. (Patient not taking: Reported on 03/26/2014) 1 vial 0   No current facility-administered medications on file prior to visit.   No Known Allergies   Review of Systems  Constitutional: Negative for fever and chills.  HENT: Negative for rhinorrhea and sore throat.   Eyes: Negative for visual disturbance.  Respiratory: Negative for cough and shortness of breath.  Cardiovascular: Negative for chest pain and leg swelling.  Gastrointestinal: Negative for nausea, vomiting, abdominal pain and diarrhea.  Genitourinary: Negative for dysuria.  Musculoskeletal: Positive for myalgias and arthralgias. Negative for back pain.  Skin: Positive for rash.  Neurological: Negative for headaches.  Psychiatric/Behavioral: Negative for dysphoric mood.       Objective:   Patient Vitals for the past 24 hrs:  BP Temp Temp src Pulse Resp SpO2  03/26/14 1006 118/63 mmHg 98.2 F (36.8 C) Oral 63 16 97 %     Physical Exam  Constitutional: She is oriented to person, place, and time. She appears  well-developed and well-nourished. No distress.  HENT:  Head: Normocephalic and atraumatic.  Eyes: Conjunctivae and EOM are normal.  Neck: Neck supple. No tracheal deviation present.  Cardiovascular: Normal rate.   Pulmonary/Chest: Effort normal. No respiratory distress.  Musculoskeletal: She exhibits tenderness.  Fine, rough, erythematous papular rash on medial right ankle; no blanching. Pain on the right first MTP joint.   Neurological: She is alert and oriented to person, place, and time.  Skin: Skin is warm and dry.  Psychiatric: She has a normal mood and affect. Her behavior is normal.  Nursing note and vitals reviewed.    Assessment & Plan:   Hemiparesis affecting dominant side as late effect of cerebrovascular accident - pt needs home AIDE to help w/ ADLs - give DSS info to call for adult care - can't afford private, could also really benefit from getting out to adult day care 1-2x/wk likely - high fall risk - walks using furniture or cane in house - transfers by herself.  Does regular PT sev x/wk - was switched to in-home over the winter so encouraged them to switch back to doing this at gym - good for pt to get out into the community - encouraged pt to try water therapy - is a member at the [facility] - through [employer] - they have lift chairs to get pt into pool then having pt walk several times across the shallow 3 ft end would likely be of huge benefit to her - will help w/ balance and strength.  Foot pain, right - Plan: Uric acid, Sedimentation rate - appears normal on exam - concern about bolts on brace rubbing - recommend covering areas of brace w/ mole skin - consider repeat podiatry referral if pain increases. Try icing  Spasticity- try increasing baclofen to bid sched from qd - can use more prn.  Dysarthria as late effect of cerebrovascular disease - worsening - pt very difficult to understand - she will need recurrent ST - seems to be worsening so will refer for repeat  speech eval - has not had since hosp sev yrs ago I suspect  Essential hypertension - Plan: COMPLETE METABOLIC PANEL WITH GFR  Dyslipidemia - Plan: Lipid panel  Rash - try top triamcinolone to lower left inner leg - suspect contact irritation  HM - Pt turned 64 yo sev mos ago - does not appear to have had a medicare wellness visit recently.  Needs prevnar at next OV.  Needs zostavax.  Meds ordered this encounter  Medications  . docusate sodium (COLACE) 50 MG capsule    Sig: Take 1 capsule (50 mg total) by mouth daily.    Dispense:  90 capsule    Refill:  3  . lisinopril (PRINIVIL,ZESTRIL) 10 MG tablet    Sig: Take 1 tablet (10 mg total) by mouth daily.    Dispense:  90 tablet  Refill:  1  . levETIRAcetam (KEPPRA) 500 MG tablet    Sig: Take 1 tablet (500 mg total) by mouth 2 (two) times daily.    Dispense:  180 tablet    Refill:  1  . HYDROcodone-acetaminophen (NORCO/VICODIN) 5-325 MG per tablet    Sig: Take 1-2 tablets by mouth every 6 (six) hours as needed for moderate pain.    Dispense:  120 tablet    Refill:  0  . digoxin (LANOXIN) 0.25 MG tablet    Sig: Take 1 tablet (0.25 mg total) by mouth daily.    Dispense:  90 tablet    Refill:  1  . carvedilol (COREG) 6.25 MG tablet    Sig: Take 1 tablet (6.25 mg total) by mouth 2 (two) times daily.    Dispense:  180 tablet    Refill:  1  . atorvastatin (LIPITOR) 80 MG tablet    Sig: Take 1 tablet (80 mg total) by mouth daily at 6 PM.    Dispense:  90 tablet    Refill:  1    D/c prior rx for pravastatin  . baclofen (LIORESAL) 20 MG tablet    Sig: Take 1 tablet (20 mg total) by mouth 2 (two) times daily. Can take an additional tablet mid-day or before bed as needed for muscle spasms    Dispense:  270 each    Refill:  1  . triamcinolone cream (KENALOG) 0.1 %    Sig: Apply 1 application topically 2 (two) times daily.    Dispense:  80 g    Refill:  0   Over 40 min spent in face-to-face evaluation of and consultation with  patient and coordination of care.  Over 50% of this time was spent counseling this patient.  Language level caveat due to severe dysarthria.  I personally performed the services described in this documentation, which was scribed in my presence. The recorded information has been reviewed and considered, and addended by me as needed.  Delman Cheadle, MD MPH

## 2014-03-26 NOTE — Patient Instructions (Addendum)
Call your physical therapist to make an appointment at the gym instead of the house. Get some mole skin to place over the bolts and area that are rubbing on your splints.  Ice your first right toe to decrease irritation  Apply the triamcinolone to the rash on your lower left inner leg - please call if it worsens.  Check into the Silver Sneakers program - this is often offered for free on certain medicare programs and so could give you access to the pool at the [facility] which would be really great for you to work on balance and strength.   [place]: [phone] - [place]: [phone]  The program contracts with community home care agencies to provide in home services, when there is a need, such as light housekeeping and personal care services (bathing, hair washing, toileting) that assist clients/customers with safely remaining in their homes.   [place]: [phone] - [place]: [phone]  Provides Medicaid eligible clients with a Special Assistance payment that will supplement their income in order to safely remain in their homes. Must meet eligibility requirements.   Adult Day Care/Health Services [place]: [phone] - [place]: [phone]  Provides assistance to aged/disabled adults to remain living independently, reduce social isolation, loneliness, and stimulate interest in leisure activities. The program is designated by the state to monitor certification compliance of (5) adult day centers in [place].   [place] Work Supervisor [address] [place], [place] [address] Phone: [phone] Fax: [phone] Email: [email or web address] .In-Home Aide .Special Assistance In-Home Aide .Adult Day Care   Blain Pais, Program Manager  Aging & Adult Services [place], [place] Phone: [phone] Fax: [phone] Email: [email or web address] .In-Home Aide/Adult Day  Care   [employer] Information Description of Services Cost  A Matter of Balance Class locations vary. Call [place] on Aging for more information.  [email or web address]/ [phone] 8-Session program addressing the fear of falling and increasing activity levels of older adults Free to minimal cost  A.C.T. By [employer] [address], [place], [place] [address].  BetaBlues.[email or web address] [phone]  Personal training, gym, classes including Silver Sneakers* and ACTion for Aging Adults Fee-based  A.H.O.Y. (Add Health to Gap) Airs on Time [employer] 13, M-F at [place]: [employer],  [place] [employer] [place],  [place], [facility], [address] [facility], [place], [place], [place] [address]  [place] Location: Sharrell Ku. [employer] [place]      [phone]  [phone]  [phone]  [phone]  [phone]  [phone]  [phone]  [phone]  [phone]  [phone]    [phone] A total-body conditioning class for adults 49 and older; designed to increase muscular strength, endurance, range of movement, flexibility, balance, agility and coordination Free  [facility] [place],  [address] [place]      [address]      [phone]      Pilate's class for individualsreturning to exercise after an injury, before or after surgery or for individuals with  complex musculoskeletal issues; designed to improve strength, balance , flexibility      $15/class   [place] [address] [place], [place] [address] [email or web address] [place] classes for beginners to advanced [place], [place] [address] [email or web address] [place] Chair Exercises Free, ages 35 and older; Ages 17-59 fee based  Marvia Pickles, [facility] [address] [place], [place] [address] Seniorcenter_1 .Beverlee Nims [phone]  A.H.O.Y. Tai Chi Fee-based Donation based or free  [place] Class locations vary.  Call or email Angela Burke or view website for more information. [email or web address] [email or web address] [phone] Ongoing classes at local YMCAs and gyms Fee-based  Silver Sneakers A.C.T. By [place] [employer]: [place] [phone]  [phone] Classes designed for older adults who want to improve their strength, flexibility, balance and endurance.   Silver sneakers is covered by some insurance plans and includes a fitness center membership at participating locations. Find out more by calling [phone] or visiting [email or web address] Covered by some insurance plans  [facility] [place] [phone] A.H.O.Y., fitness room, personal training, fitness classes for injury prevention, strength, balance, flexibility, water fitness classes Ages 55+: $54 for 6 months; Ages 81-54: $21 for 6 months  Tai Chi for Everybody  [facility] [address] [place], [place] [address] Taichiforeverybody_3 .Patsi Sears [phone] Tai Chi classes for beginners to advanced; geared for seniors Donation Based      [employer] ([place] Others Participate in Exercise     Loyal Gambler. Rosana Hoes, PhD, [place] [email or web address] [place]     [phone]     A comprehensive fitness program for adults.  The program paris senior-level undergraduates Kinesiology students with adults who desire to learn how to exercise safely.  Includes a structural exercise class focusing on functional fitnesss     $100/semester in fall and spring; $75 in summer (no trainers)    *Silver Sneakers is covered by some Personal assistant and includes a  Radio producer at participating locations.  Find out more by calling [phone] or visiting [email or web address]  For additional health and human services resources for senior adults, please contact SeniorLine at [phone] in [place] and [place] at [phone] in all other areas.

## 2014-03-29 ENCOUNTER — Encounter: Payer: Self-pay | Admitting: Family Medicine

## 2014-04-12 ENCOUNTER — Ambulatory Visit: Payer: Commercial Managed Care - HMO | Attending: Family Medicine

## 2014-04-12 DIAGNOSIS — I69920 Aphasia following unspecified cerebrovascular disease: Secondary | ICD-10-CM | POA: Insufficient documentation

## 2014-04-12 DIAGNOSIS — I69320 Aphasia following cerebral infarction: Secondary | ICD-10-CM

## 2014-04-12 NOTE — Therapy (Signed)
[facility] [address] [place], [place], [address] Phone: [phone]   Fax:  [phone]  Speech Language Pathology Evaluation  Patient Details  Name: Valerie M [place] MRN: [id number] Date of Birth: 23-Aug-1950 Referring Provider:  [name], MD  Encounter Date: 04/12/2014      End of Session - 04/12/14 1425    Visit Number 1   Number of Visits 1   Date for SLP Re-Evaluation 04/12/14   SLP Start Time 17   SLP Stop Time  1114   SLP Time Calculation (min) 54 min   Activity Tolerance Patient tolerated treatment well      Past Medical History  Diagnosis Date  . CVA (cerebral infarction)   . Hypertension   . Seizures   . Hyperlipidemia   . Hemiparesis   . CHF (congestive heart failure)     EF 15-20% as of 05/28/11 (Dr [facility] D[facility] summary)  . Vitamin D deficiency 2010  . Adenomatous polyp of colon 09/2012    repeat colonoscopy in 5 years by Dr. Sharlett Iles  . Stroke     Past Surgical History  Procedure Laterality Date  . Hip surgery    . Fracture surgery    . Abdominal hysterectomy    . Tubal ligation      There were no vitals filed for this visit.  Visit Diagnosis: Aphasia as late effect of cerebrovascular accident      Subjective Assessment - 04/12/14 1421    Symptoms (unintelligible) Pt not demonstrating understanding of simple questions re: health history and meds - looked at husband with quizzical expression. Husband states no real changes with language or speech in last 6-12 months.            SLP Evaluation [facility] - 04/12/14 1027    SLP Visit Information   [place] Date December 2009   Medical Diagnosis CVA   Subjective   Subjective Unintelligible   Pain Assessment   Pain Score 0-No pain   General Information   HPI Pt with 18 sessions ST in fall-winter 2010 at this facility after receiving extensive ST via [facility] and at SNF following CVA in December 2009. Pt with 2 years of  ST at [facility], ended approx 2014   Prior Functional Status   Cognitive/Linguistic Baseline Baseline deficits   Baseline deficit details Severe receptive and severe/profound expressive language deficits with likely possible verbal apraxia.    Lives With Spouse   Available Support Family   Cognition   Overall Cognitive Status Difficult to assess   Difficult to assess due to Impaired communication   Auditory Comprehension   Overall Auditory Comprehension Impaired at baseline   Yes/No Questions Impaired   Basic Biographical Questions Other (comment)  anwered yes to 90% of questions with yes/no board-40% succes   Other Yes/No Questions Comments` --  50% with simple logical yes/no with yes/no board   Commands Impaired   One Step Basic Commands 25-49% accurate   Other Commands Comment 50% fucntional commands (2/4)   Interfering Components Processing speed  ? verbal apraxia due to difficulty with oral motor tasks   EffectiveTechniques Repetition;Extra processing time;Pausing;Visual/Gestural cues  multiple repetitions   Overall Auditory Comprehension Comments Pt did not understand simple health history questions - looked at husband with quizzical look.   Expression   Primary Mode of Expression Nonverbal - gestures   Verbal Expression   Overall Verbal Expression Impaired at baseline   Automatic Speech Counting  singing-correct syllable structure  75% with happy birthday   Level of Generative/Spontaneous Verbalization Word  common/rote words ("thanks", "no") only    Repetition Impaired   Level of Impairment Word level   Interfering Components Premorbid deficit  severity of impairments   Effective Techniques Semantic cues;Phonemic cues;Articulatory cues   Non-Verbal Means of Communication Gestures;Communication board  pt with alternative communication board via [employer]   Other Verbal Expression Comments Pt's verbal communication is nonfunctional. She has a communication device (iPad) via [place]  that husband states is no longer used.   Oral Motor/Sensory Function   Overall Oral Motor/Sensory Function Impaired at baseline  had difficulty with imitation of SLP oral-motor tasks                    Plan - 04/25/2014 1426    Clinical Impression Statement Pt presents today essentially unchanged in most areas as in 2010. Basic receptive language skills have improved slightly, but expressive skills have remained nonfunctional. I recommend a re-evaluation of skills at [facility] in approx 3-6 months due to that facility seeing pt for approx 2 years, finishing therapy (reportedly) in 2014. At this time, they know pt best. She  may need refresher on how to use communication device practiced at that facility, or may need updating of the device.   Speech Therapy Frequency --  Eval only   Consulted and Agree with Plan of Care Patient;Family member/caregiver   Family Member Consulted husband          G-Codes - 2014/04/25 1431    Functional Assessment Tool Used noms   Functional Limitations Spoken language expressive   Spoken Language Expression Current Status [phone]) At least 37 percent but less than 100 percent impaired, limited or restricted   Spoken Language Expression Goal Status [phone]) At least 57 percent but less than 100 percent impaired, limited or restricted   Spoken Language Expression Discharge Status [phone]) At least 80 percent but less than 100 percent impaired, limited or restricted      Problem List Patient Active Problem List   Diagnosis Date Noted  . Vitamin D deficiency 12/19/2012  . Dysarthria due to cerebrovascular accident 12/12/2012  . Gait instability 12/12/2012  . Contracture of joint 12/12/2012  . Hemiplegia of dominant side as late effect following cerebrovascular disease 12/12/2012  . Anticoagulated on Coumadin 09/19/2011  . Cardiomyopathy in other diseases classified elsewhere 08/24/2011  . (L) MCA cardio embolic stroke  01/30/3233  . Dyslipidemia  06/25/2011  . H/O tobacco use 06/25/2011  . Weakness 05/28/2011  . History of CVA (cerebrovascular accident) 05/28/2011  . HTN (hypertension) 05/28/2011  . HLD (hyperlipidemia) 05/28/2011  . Seizure disorder 05/28/2011    [facility], SLP 04/25/2014, 2:32 PM  [facility] [address] [place], [place], [address] Phone: [phone]   Fax:  [phone]

## 2014-04-12 NOTE — Patient Instructions (Addendum)
  I suggest a re-eval at [facility] in 3-6 months, due to she may need to update/upgrade/modify her communication device (iPad) she worked with at [employer], or may need a refresher on how to use it. From what you said, they worked with Mardene Celeste for two years, finishing in 2014. They know her best at this point.

## 2014-04-12 NOTE — Addendum Note (Signed)
Addended by: Garald Balding B on: 04/12/2014 03:43 PM   Modules accepted: Orders

## 2014-04-21 ENCOUNTER — Telehealth: Payer: Self-pay

## 2014-04-21 NOTE — Telephone Encounter (Signed)
carvedilol (COREG) 6.25 MG tablet   Husband states the medication is not available through [facility], can something else be called in   [phone]

## 2014-04-22 DIAGNOSIS — I639 Cerebral infarction, unspecified: Secondary | ICD-10-CM | POA: Diagnosis not present

## 2014-04-22 DIAGNOSIS — Z7901 Long term (current) use of anticoagulants: Secondary | ICD-10-CM | POA: Diagnosis not present

## 2014-04-26 NOTE — Telephone Encounter (Signed)
Checked and carvedilol is on $4 list at [facility] and HT. Called and spoke to husband and advised and during conversation he realized that he thinks he told us the wrong medication. He believes it is for the Colace because he said he thinks it is a mild laxative. Advised him that colace is available OTC at the same strength as Rxd and this is probably why ins won't cover it. He agreed to go to pharm and buy OTC, and I advised him on dosage that Dr Brigitte Pulse Rxd for pt.

## 2014-04-26 NOTE — Telephone Encounter (Signed)
Very suprising as carvedilol is generic and is on the $4 list at most pharmacies. could you please contact [employer] and see if there is a PA we could do or something as it would be best for pt not to switch a med she has been doing well on.  If for some reason it is cost-prohibitive and pt doesn't want to or can't pay out of pocket for it (e.g. just get it at [facility] without running it  through their insurance carrier) then I guess we could switch pt to metoprolol tartrate 25mg  bid - disp 180 refill 1 - check BP at home 2-3x/wk to make sure new med dose is correct.

## 2014-06-01 DIAGNOSIS — I639 Cerebral infarction, unspecified: Secondary | ICD-10-CM | POA: Diagnosis not present

## 2014-06-01 DIAGNOSIS — Z7901 Long term (current) use of anticoagulants: Secondary | ICD-10-CM | POA: Diagnosis not present

## 2014-07-13 DIAGNOSIS — I639 Cerebral infarction, unspecified: Secondary | ICD-10-CM | POA: Diagnosis not present

## 2014-07-13 DIAGNOSIS — Z7901 Long term (current) use of anticoagulants: Secondary | ICD-10-CM | POA: Diagnosis not present

## 2014-09-02 DIAGNOSIS — Z7901 Long term (current) use of anticoagulants: Secondary | ICD-10-CM | POA: Diagnosis not present

## 2014-09-02 DIAGNOSIS — I639 Cerebral infarction, unspecified: Secondary | ICD-10-CM | POA: Diagnosis not present

## 2014-09-30 ENCOUNTER — Ambulatory Visit: Payer: Commercial Managed Care - HMO | Admitting: Family Medicine

## 2014-09-30 ENCOUNTER — Encounter: Payer: Self-pay | Admitting: Family Medicine

## 2014-09-30 ENCOUNTER — Ambulatory Visit (INDEPENDENT_AMBULATORY_CARE_PROVIDER_SITE_OTHER): Payer: Commercial Managed Care - HMO | Admitting: Family Medicine

## 2014-09-30 VITALS — BP 125/70 | HR 74 | Temp 98.1°F | Resp 16 | Wt 120.0 lb

## 2014-09-30 DIAGNOSIS — M2450 Contracture, unspecified joint: Secondary | ICD-10-CM | POA: Diagnosis not present

## 2014-09-30 DIAGNOSIS — I43 Cardiomyopathy in diseases classified elsewhere: Secondary | ICD-10-CM

## 2014-09-30 DIAGNOSIS — I10 Essential (primary) hypertension: Secondary | ICD-10-CM

## 2014-09-30 DIAGNOSIS — Z23 Encounter for immunization: Secondary | ICD-10-CM

## 2014-09-30 DIAGNOSIS — Z8673 Personal history of transient ischemic attack (TIA), and cerebral infarction without residual deficits: Secondary | ICD-10-CM

## 2014-09-30 DIAGNOSIS — Z1239 Encounter for other screening for malignant neoplasm of breast: Secondary | ICD-10-CM | POA: Diagnosis not present

## 2014-09-30 DIAGNOSIS — Z13820 Encounter for screening for osteoporosis: Secondary | ICD-10-CM

## 2014-09-30 DIAGNOSIS — G40909 Epilepsy, unspecified, not intractable, without status epilepticus: Secondary | ICD-10-CM

## 2014-09-30 DIAGNOSIS — I69922 Dysarthria following unspecified cerebrovascular disease: Secondary | ICD-10-CM

## 2014-09-30 DIAGNOSIS — Z780 Asymptomatic menopausal state: Secondary | ICD-10-CM | POA: Diagnosis not present

## 2014-09-30 DIAGNOSIS — R2681 Unsteadiness on feet: Secondary | ICD-10-CM | POA: Diagnosis not present

## 2014-09-30 DIAGNOSIS — R258 Other abnormal involuntary movements: Secondary | ICD-10-CM

## 2014-09-30 DIAGNOSIS — R252 Cramp and spasm: Secondary | ICD-10-CM

## 2014-09-30 DIAGNOSIS — Z79899 Other long term (current) drug therapy: Secondary | ICD-10-CM | POA: Diagnosis not present

## 2014-09-30 DIAGNOSIS — I69359 Hemiplegia and hemiparesis following cerebral infarction affecting unspecified side: Secondary | ICD-10-CM | POA: Diagnosis not present

## 2014-09-30 DIAGNOSIS — E785 Hyperlipidemia, unspecified: Secondary | ICD-10-CM

## 2014-09-30 DIAGNOSIS — Z0000 Encounter for general adult medical examination without abnormal findings: Secondary | ICD-10-CM

## 2014-09-30 LAB — COMPREHENSIVE METABOLIC PANEL
ALT: 13 U/L (ref 6–29)
AST: 20 U/L (ref 10–35)
Albumin: 4.1 g/dL (ref 3.6–5.1)
Alkaline Phosphatase: 133 U/L — ABNORMAL HIGH (ref 33–130)
BUN: 23 mg/dL (ref 7–25)
CO2: 24 mmol/L (ref 20–31)
Calcium: 9.2 mg/dL (ref 8.6–10.4)
Chloride: 105 mmol/L (ref 98–110)
Creat: 0.83 mg/dL (ref 0.50–0.99)
Glucose, Bld: 91 mg/dL (ref 65–99)
POTASSIUM: 4.5 mmol/L (ref 3.5–5.3)
SODIUM: 140 mmol/L (ref 135–146)
TOTAL PROTEIN: 7.1 g/dL (ref 6.1–8.1)
Total Bilirubin: 0.3 mg/dL (ref 0.2–1.2)

## 2014-09-30 LAB — POCT URINALYSIS DIPSTICK
GLUCOSE UA: NEGATIVE
Nitrite, UA: NEGATIVE
PH UA: 5.5
Protein, UA: NEGATIVE
RBC UA: NEGATIVE
SPEC GRAV UA: 1.02
UROBILINOGEN UA: 0.2

## 2014-09-30 LAB — LIPID PANEL
CHOL/HDL RATIO: 3.5 ratio (ref <=5.0)
CHOLESTEROL: 125 mg/dL (ref 125–200)
HDL: 36 mg/dL — ABNORMAL LOW (ref >=46)
LDL CALC: 70 mg/dL (ref <130)
Triglycerides: 93 mg/dL (ref <150)
VLDL: 19 mg/dL (ref <30)

## 2014-09-30 LAB — DIGOXIN LEVEL: DIGOXIN LVL: 1.1 ug/L (ref 0.8–2.0)

## 2014-09-30 NOTE — Progress Notes (Deleted)
   Subjective:    Patient ID: [name], female    DOB: 23-Sep-1950, 64 y.o.   MRN: [id number] If pt doesn't have any specific prob today lets do her AWV HPI  [name] is a delightful 64 yo woman who is here today with her husband [name].  [name] suffered a severe stroke in 12/2007 resulting in right hemiplegia and severe dysarthria so her husband often has to speak for her and help interpret her statements.  Hemplegia:  At visit 6 mos ago she was working with home PT twice a week.  She often has right foot pain and does have a rt foot splint from the podiatrist.  Baclofen has helped some so we increased this to bid at her last visit with more prn, vicodin provided minimal relief.  S/p CVA on warfarin: Had a sz before CVA so unsure if risk for recurrence but pt has been very stable on Keppra 500 bidso left on. INR followed at [facility].  [facility].  Unfortunately, when they gave her a yes/no board, pt only answered simple logic and basic biographical questions correctly 40-50% of the time.  Through a program at [facility] speech therapy, pt was given an ipad to communicate with which pt does not use.  The SLP from [facility] in 03/2014 recommended to pt that she return to [facility] since she did over 2 yrs of therapy there ending in 2014.  Husband reports that pt does help cook meals and occ clean, can walk around their house some.  Was going to the [facility] and PT prev.  Would really benefit from a home health aide but difficult to afford so referred to [facility] and DSS to see if a case manager could help them utilize any ins benefit for this. Encouraged pt and husband to look into an adult day program for pt to attend occ.  Looks like we placed referral to [facility] to provide personal care services around last Dec 2015? Had skilled nursing for [facility] prior to that  GI: Had routine screening colonoscopy 05/2012 by Dr. Sharlett Iles - repeat in 5 years  due to adenomatous polyp.  Uses miralax reg w/ prn colace for chronic constipation  CHF and HTN, HPL:  Stable on lisinopril 10, carvedilol 6.25 bid, lipitor 80 (swiched from pravastatin), digoxin 0.25.  Cardiologist is Dr. Einar Gip  Who saw pt in 2013 and thought reduced ER 15-20% was likely secondary to stress (see scanned note in 03/2012) and pt likely did NOT have CHF - repeated echo which showed grade 1 diastolic, EF was 55% so no further f/u needed unless pt developed changes, signs of fluid overload, etc.  Vit D Def, weight:  TSH and vit D nml 11 mos ago, cbc and digoxin level nml nml 1 yr ago, no recent h/o UTI - last was 6 yrs ago Mam done 08/2012 at [facility], no prior DEXA seen Review of Systems     Objective:   Physical Exam        Assessment & Plan:  Needs prevnar today and flu shot.   Get shingles shot? Therapeutic digoxin level 0.5-0.9 ng/mL, toxic >2 - should be drawn just before next dose or >6 hrs after last dose.  Taking it once dialy EKG done 07/2013 Refer for repeat mam - last was >2 yrs prior; refer for dexa - none prior.

## 2014-09-30 NOTE — Patient Instructions (Addendum)

## 2014-10-01 ENCOUNTER — Ambulatory Visit: Payer: Commercial Managed Care - HMO | Admitting: Family Medicine

## 2014-10-06 ENCOUNTER — Other Ambulatory Visit: Payer: Self-pay

## 2014-10-06 DIAGNOSIS — Z1231 Encounter for screening mammogram for malignant neoplasm of breast: Secondary | ICD-10-CM

## 2014-10-13 DIAGNOSIS — Z7901 Long term (current) use of anticoagulants: Secondary | ICD-10-CM | POA: Diagnosis not present

## 2014-10-13 DIAGNOSIS — I639 Cerebral infarction, unspecified: Secondary | ICD-10-CM | POA: Diagnosis not present

## 2014-10-18 MED ORDER — WARFARIN SODIUM 5 MG PO TABS: ORAL_TABLET | ORAL | Status: DC

## 2014-10-18 MED ORDER — LEVETIRACETAM 500 MG PO TABS: 500.0000 mg | ORAL_TABLET | Freq: Two times a day (BID) | ORAL | Status: DC

## 2014-10-18 MED ORDER — CARVEDILOL 6.25 MG PO TABS: 6.2500 mg | ORAL_TABLET | Freq: Two times a day (BID) | ORAL | Status: DC

## 2014-10-18 MED ORDER — BACLOFEN 20 MG PO TABS: 20.0000 mg | ORAL_TABLET | Freq: Three times a day (TID) | ORAL | Status: DC

## 2014-10-18 MED ORDER — ATORVASTATIN CALCIUM 80 MG PO TABS: 80.0000 mg | ORAL_TABLET | Freq: Every day | ORAL | Status: DC

## 2014-10-18 MED ORDER — DIGOXIN 250 MCG PO TABS: 0.2500 mg | ORAL_TABLET | Freq: Every day | ORAL | Status: DC

## 2014-10-18 MED ORDER — LISINOPRIL 10 MG PO TABS: 10.0000 mg | ORAL_TABLET | Freq: Every day | ORAL | Status: DC

## 2014-10-18 NOTE — Progress Notes (Signed)
Subjective:    Patient ID: [name], female    DOB: [birth year], 64 y.o.   MRN: [id number]  Chief Complaint  Patient presents with  . Annual Exam    no pap  . Medication Refill    all meds except kenalog cream    HPI  [name] is a delightful 64 yo woman who is here today with her husband [name].  [name] suffered a severe stroke in 12/2007 resulting in right hemiplegia and severe dysarthria so her husband often has to speak for her and help interpret her statements.  S/p CVA on warfarin: INR followed at [place] [facility].    Saw speech therapist at [facility]p [facility].  Unfortunately, when they gave her a yes/no board, pt only answered simple logic and basic biographical questions correctly 40-50% of the time.  Through a program at [facility] speech therapy, pt was given an ipad to communicate with which pt does not use.  The SLP from [facility] in 03/2014 recommended to pt that she return to [place] since she did over 2 yrs of therapy there ending in 2014.    Hemplegia:  At visit 6 mos ago she was working with home PT twice a week.  She often has right foot pain and does have a rt foot splint from the podiatrist.  Baclofen has helped some so we increased this to bid at her last visit with more prn, vicodin provided minimal relief.  Husband reports that pt does help cook meals and occ clean, can walk around their house some.  Was going to the [facility] and PT prev.  Would really benefit from a home health aide but difficult to afford so referred to [facility] and DSS to see if a case manager could help them utilize any ins benefit for this. Encouraged pt and husband to look into an adult day program for pt to attend occ.  Looks like we placed referral to [facility] to provide personal care services around last Dec 2015? Had skilled nursing for [facility] prior to that.  Had a sz before CVA so unsure if risk for recurrence but pt has been very stable on Keppra 500 bidso left on.   GI:  Had routine screening colonoscopy 05/2012 by Dr. Sharlett Iles - repeat in 5 years due to adenomatous polyp.  Uses miralax reg w/ prn colace for chronic constipation  CHF and HTN, HPL:  Stable on lisinopril 10, carvedilol 6.25 bid, lipitor 80 (swiched from pravastatin), digoxin 0.25.  Cardiologist is Dr. Einar Gip  Who saw pt in 2013 and thought reduced ER 15-20% was likely secondary to stress (see scanned note in 03/2012) and pt likely did NOT have CHF - repeated echo which showed grade 1 diastolic, EF was 22% so no further f/u needed unless pt developed changes, signs of fluid overload, etc.  Vit D Def, weight:  TSH and vit D nml 11 mos ago, cbc and digoxin level nml nml 1 yr ago, no recent h/o UTI - last was 6 yrs ago Mam done 08/2012 at [facility], no prior DEXA seen  Depression screen [facility] 2/9 09/30/2014 12/12/2012  Decreased Interest 0 0  Down, Depressed, Hopeless 0 1  PHQ - 2 Score 0 1   Past Medical History  Diagnosis Date  . CVA (cerebral infarction)   . Hypertension   . Seizures   . Hyperlipidemia   . Hemiparesis   . CHF (congestive heart failure)     EF 15-20% as of  05/28/11 (Dr [facility] summary)  . Vitamin D deficiency 2010  . Adenomatous polyp of colon 09/2012    repeat colonoscopy in 5 years by Dr. Sharlett Iles  . Stroke    Past Surgical History  Procedure Laterality Date  . Hip surgery    . Fracture surgery    . Abdominal hysterectomy    . Tubal ligation     Current Outpatient Prescriptions on File Prior to Visit  Medication Sig Dispense Refill  . docusate sodium (COLACE) 50 MG capsule Take 1 capsule (50 mg total) by mouth daily. 90 capsule 3  . HYDROcodone-acetaminophen (NORCO/VICODIN) 5-325 MG per tablet Take 1-2 tablets by mouth every 6 (six) hours as needed for moderate pain. 120 tablet 0  . Liniments (HEET TRIPLE ACTION EX) Apply 1 application topically as needed (for pain).    . polyethylene glycol (MIRALAX / GLYCOLAX) packet Take 17 g by mouth daily as  needed for moderate constipation.    . triamcinolone cream (KENALOG) 0.1 % Apply 1 application topically 2 (two) times daily. 80 g 0  . zoster vaccine live, PF, (ZOSTAVAX) 69629 UNT/0.65ML injection Inject 19,400 Units into the skin once. (Patient not taking: Reported on 09/30/2014) 1 vial 0   No current facility-administered medications on file prior to visit.   Not on File Family History  Problem Relation Age of Onset  . Diabetes Daughter   . Colon cancer Maternal Grandfather    Social History   Social History  . Marital Status: Married    Spouse Name: N/A  . Number of Children: 2  . Years of Education: 12   Occupational History  .     Social History Main Topics  . Smoking status: Former Smoker    Types: Cigarettes    Quit date: 09/26/2007  . Smokeless tobacco: Never Used  . Alcohol Use: No  . Drug Use: No  . Sexual Activity: No   Other Topics Concern  . None   Social History Narrative     Review of Systems  Constitutional: Positive for activity change. Negative for fever, chills, appetite change and unexpected weight change.  Cardiovascular: Negative for leg swelling.  Gastrointestinal: Negative for vomiting and constipation.  Endocrine: Negative for polydipsia, polyphagia and polyuria.  Genitourinary: Negative for dysuria and difficulty urinating.  Musculoskeletal: Positive for myalgias, arthralgias and gait problem. Negative for back pain and joint swelling.  Allergic/Immunologic: Negative for immunocompromised state.  Neurological: Positive for tremors, speech difficulty and weakness. Negative for dizziness, seizures, syncope, light-headedness and numbness.  Hematological: Negative for adenopathy. Bruises/bleeds easily.  Psychiatric/Behavioral: Positive for confusion, decreased concentration and agitation. Negative for dysphoric mood. The patient is not nervous/anxious.        Objective:  BP 125/70 mmHg  Pulse 74  Temp(Src) 98.1 F (36.7 C) (Oral)  Resp  16  Wt 120 lb (54.432 kg)  Physical Exam  Constitutional: She appears well-developed and well-nourished. No distress.  In wheelchair  HENT:  Head: Normocephalic and atraumatic.  Right Ear: External ear normal.  Left Ear: External ear normal.  Eyes: Conjunctivae are normal. No scleral icterus.  Neck: Normal range of motion. Neck supple. No thyromegaly present.  Cardiovascular: Normal rate, regular rhythm, normal heart sounds and intact distal pulses.   Pulmonary/Chest: Effort normal and breath sounds normal. No respiratory distress.  Musculoskeletal: She exhibits no edema.  Lymphadenopathy:    She has no cervical adenopathy.  Neurological: She is alert. She displays atrophy and tremor. She exhibits abnormal muscle tone. Gait  abnormal.  Unable to understand pt at all due to her severe dysarthria.  Skin: Skin is warm and dry. She is not diaphoretic. No erythema.  Some dry skin on legs  Psychiatric: She has a normal mood and affect. Her speech is slurred. She is agitated. Cognition and memory are impaired.  Pt clearly becomes frustratred while trying to communicate   EKG done 07/2013    Assessment & Plan:  Needs to get shingles shot.     1. Hemiparesis affecting dominant side as late effect of cerebrovascular accident - cont baclofen, increase as needed.  Needs new wrist splint for right wrist contracture - husband thinks that pt initially had a really good one made through neuroPT or OT so will refer back for that  2. Dysarthria as late effect of cerebrovascular disease - worsening - pt has been to SLP through [facility] and prior to that [facility] but unwilling to use yes/no board or ipad to communicate - husband thinks pt gets confused to easily and frustrated to remember how to do it so no desire to f/u w/ [employer] to see if better equipment.  Seems to be worsening  3. Essential hypertension - well controlled on lisinopril and coreg, cont  4. Dyslipidemia - ldl at goal on atrovastatin 80  5.  History of CVA (cerebrovascular accident) - cont coumadin  6. Need for prophylactic vaccination and inoculation against influenza - done today  7. Gait instability - pt no longer going to gym but did recently complete PT and will try to restart  8. Encounter for long-term (current) use of medications   9. Spasticity   10. Contracture of joint   11. Screening for breast cancer - Refer for repeat mam - last was >2 yrs prior; refer for dexa - none prior.  12. Postmenopausal estrogen deficiency   13. Screening for osteoporosis - sched for DEXA with next mammogram  14. Need for prophylactic vaccination against Streptococcus pneumoniae (pneumococcus) - prevar given today  15.    H/o seizures - well controlled for 3 yrs on Keppra but when she accidentally missed a few days there was a concern she had a focal sz so cont indefinitely 16.    H/o CHF/cardiomyopathy - seen by Dr. Einar Gip in 2014 who suspected pt did not have CHF and that CM was stress induced during CVA - repeat echo not seen - on digoxin.  Therapeutic digoxin level 0.5-0.9 ng/mL, toxic >2 - should be drawn just before next dose or >6 hrs after last dose.  Taking it once dialy  F/u w/ me in 6 mos for revew of chronic med conditions. Cont to f/u at [facility] coumadin clinic as instructed.  Orders Placed This Encounter  Procedures  . MM Digital Screening    Pf 09/08/2012 bcg    Standing Status: Future     Number of Occurrences:      Standing Expiration Date: 11/30/2015    Order Specific Question:  Reason for Exam (SYMPTOM  OR DIAGNOSIS REQUIRED)    Answer:  screening    Order Specific Question:  Preferred imaging location?    Answer:  [facility]  . DG Bone Density    estrogen deficiency/no pf/wt-120    Standing Status: Future     Number of Occurrences:      Standing Expiration Date: 11/30/2015    Order Specific Question:  Reason for Exam (SYMPTOM  OR DIAGNOSIS REQUIRED)    Answer:  estrogen deficiency    Order Specific Question:  Preferred imaging location?    Answer:  [facility]  . Flu Vaccine QUAD 36+ mos IM  . Pneumococcal conjugate vaccine 13-valent IM  . Comprehensive metabolic panel    Order Specific Question:  Has the patient fasted?    Answer:  Yes  . Lipid panel    Order Specific Question:  Has the patient fasted?    Answer:  Yes  . Digoxin level  . POCT urinalysis dipstick     Delman Cheadle, MD MPH  Results for orders placed or performed in visit on 09/30/14  Comprehensive metabolic panel  Result Value Ref Range   Sodium 140 135 - 146 mmol/L   Potassium 4.5 3.5 - 5.3 mmol/L   Chloride 105 98 - 110 mmol/L   CO2 24 20 - 31 mmol/L   Glucose, Bld 91 65 - 99 mg/dL   BUN 23 7 - 25 mg/dL   Creat 0.83 0.50 - 0.99 mg/dL   Total Bilirubin 0.3 0.2 - 1.2 mg/dL   Alkaline Phosphatase 133 (H) 33 - 130 U/L   AST 20 10 - 35 U/L   ALT 13 6 - 29 U/L   Total Protein 7.1 6.1 - 8.1 g/dL   Albumin 4.1 3.6 - 5.1 g/dL   Calcium 9.2 8.6 - 10.4 mg/dL  Lipid panel  Result Value Ref Range   Cholesterol 125 125 - 200 mg/dL   Triglycerides 93 <150 mg/dL   HDL 36 (L) >=46 mg/dL   Total CHOL/HDL Ratio 3.5 <=5.0 Ratio   VLDL 19 <30 mg/dL   LDL Cholesterol 70 <130 mg/dL  Digoxin level  Result Value Ref Range   Digoxin Level 1.1 0.8 - 2.0 ug/L  POCT urinalysis dipstick  Result Value Ref Range   Color, UA yellow    Clarity, UA clear    Glucose, UA neg    Bilirubin, UA small    Ketones, UA trace    Spec Grav, UA 1.020    Blood, UA neg    pH, UA 5.5    Protein, UA neg    Urobilinogen, UA 0.2    Nitrite, UA neg    Leukocytes, UA moderate (2+) (A) Negative

## 2014-10-18 NOTE — Addendum Note (Signed)
Addended by: Delman Cheadle on: 10/18/2014 09:07 PM   Modules accepted: Miquel Dunn

## 2014-11-23 DIAGNOSIS — I639 Cerebral infarction, unspecified: Secondary | ICD-10-CM | POA: Diagnosis not present

## 2014-11-23 DIAGNOSIS — Z7901 Long term (current) use of anticoagulants: Secondary | ICD-10-CM | POA: Diagnosis not present

## 2014-11-24 ENCOUNTER — Ambulatory Visit: Payer: Commercial Managed Care - HMO | Attending: Family Medicine | Admitting: Occupational Therapy

## 2014-11-24 DIAGNOSIS — M25641 Stiffness of right hand, not elsewhere classified: Secondary | ICD-10-CM | POA: Insufficient documentation

## 2014-11-24 DIAGNOSIS — R258 Other abnormal involuntary movements: Secondary | ICD-10-CM | POA: Insufficient documentation

## 2014-11-24 DIAGNOSIS — R252 Cramp and spasm: Secondary | ICD-10-CM

## 2014-11-24 NOTE — Patient Instructions (Signed)
Your Splint This splint should initially be fitted by a healthcare practitioner.  The healthcare practitioner is responsible for providing wearing instructions and precautions to the patient, other healthcare practitioners and care provider involved in the patient's care.  This splint was custom made for you. Please read the following instructions to learn about wearing and caring for your splint.  Precautions Should your splint cause any of the following problems, remove the splint immediately and contact your therapist/physician.  Swelling  Severe Pain  Pressure Areas  Stiffness  Numbness  Do not wear your splint while operating machinery unless it has been fabricated for that purpose.  When To Wear Your Splint Where your splint according to your therapist/physician instructions. Daytime for 8 hours  Care and Cleaning of Your Splint 1. Keep your splint away from open flames. 2. Your splint will lose its shape in temperatures over 135 degrees Farenheit, ( in car windows, near radiators, ovens or in hot water).  Never make any adjustments to your splint, if the splint needs adjusting remove it and make an appointment to see your therapist. 3. Your splint, including the cushion liner may be cleaned with soap and lukewarm water or rubbing alcohol.  Do not immerse in hot water over 135 degrees Farenheit. 4. For ink or hard to remove spots use a scouring cleanser which contains chlorine.  Rinse the splint thoroughly after using chlorine cleanser.

## 2014-11-24 NOTE — Therapy (Signed)
[facility] [address] [place], [place], [address] Phone: [phone]   Fax:  [phone]  Occupational Therapy Evaluation  Patient Details  Name: [name] MRN: [id number] Date of Birth: 1950-12-11 Referring Provider: Delman Cheadle  Encounter Date: 11/24/2014      OT End of Session - 11/24/14 1426    Visit Number 1   Number of Visits 3   Date for OT Re-Evaluation 12/22/14   Authorization Type MCR    OT Start Time 0930   OT Stop Time 1025   OT Time Calculation (min) 55 min   Equipment Utilized During Treatment SPLINT   Activity Tolerance Patient tolerated treatment well      Past Medical History  Diagnosis Date  . CVA (cerebral infarction)   . Hypertension   . Seizures   . Hyperlipidemia   . Hemiparesis   . CHF (congestive heart failure)     EF 15-20% as of 05/28/11 (Dr [facility] summary)  . Vitamin D deficiency 2010  . Adenomatous polyp of colon 09/2012    repeat colonoscopy in 5 years by Dr. Sharlett Iles  . Stroke     Past Surgical History  Procedure Laterality Date  . Hip surgery    . Fracture surgery    . Abdominal hysterectomy    . Tubal ligation      There were no vitals filed for this visit.  Visit Diagnosis:  Stiffness of joint, hand, right - Plan: Ot plan of care cert/re-cert  Spasticity - Plan: Ot plan of care cert/re-cert      Subjective Assessment - 11/24/14 0940    Subjective  Pt reports splint feels fine with yes response    Patient is accompained by: Family member  husband   Pertinent History CVA 2009 with residual spastic hemiplegia affecting dominant side   Patient Stated Goals To make new splint   Currently in Pain? No/denies           [facility] OT Assessment - 11/24/14 0001    Assessment   Diagnosis Hemiparesis affecting dominant side as late effect of CVA (2009), h/o CVA, Spasticity, contracture of joint   Referring Provider Delman Cheadle   Onset Date --   2009   Assessment Pt requires quad cane and supervision to CGA for safe ambulation. Pt with Rt side hemiplegia. Pt also has severe expressive aphasia and cannot independently communicate but can answer yes/no and say a few words   Prior Therapy outpatient therapy in 2009   Precautions   Precautions Pump Back With Spouse   ADL   ADL comments Pt requires assist for all ADLS                  OT Treatments/Exercises (OP) - 11/24/14 0001    Splinting   Splinting New referral was sent for new splint to be fabricated for Rt hand/wrist. Pt's previous resting hand splint was cracking and all straps were falling apart. Fabricated and fitted new resting hand splint and issued. Reviewed splint wear and care and hand hygiene with patient/husband               OT Education - 11/24/14 1022    Education provided Yes   Education Details splint wear and care   Person(s) Educated Patient;Spouse   Methods Explanation;Handout   Comprehension Verbalized understanding             OT Long Term Goals -  28-Nov-2014 1430    OT LONG TERM GOAL #1   Title Independent w/ splint wear and care   Status Achieved               Plan - November 28, 2014 1427    Clinical Impression Statement Pt is a 64 y.o. female who returns to outpatient rehab for splinting purposes s/p CVA in 2009 with residual spastic hemiplegia on Rt dominant side. Pt referred for new resting hand splint and fabricated/issued today.    Pt will benefit from skilled therapeutic intervention in order to improve on the following deficits (Retired) Impaired UE functional use;Impaired flexibility;Decreased range of motion   Rehab Potential Good   OT Frequency --  3 visits over a month for splinting adjustments prn   OT Treatment/Interventions Patient/family education;Splinting   Plan splint adjustments only prn. Will resolve episode of care if patient does not return within the month   Consulted and Agree  with Plan of Care Patient;Family member/caregiver   Family Member Consulted pt's husband          G-Codes - 11-28-14 1432    Functional Assessment Tool Used New splint provided for positioning   Functional Limitation Other OT primary   Other OT Primary Current Status [phone]) At least 1 percent but less than 20 percent impaired, limited or restricted   Other OT Primary Goal Status (V2536) At least 1 percent but less than 20 percent impaired, limited or restricted   Other OT Primary Discharge Status [id number]) At least 1 percent but less than 20 percent impaired, limited or restricted      Problem List Patient Active Problem List   Diagnosis Date Noted  . Vitamin D deficiency 12/19/2012  . Dysarthria due to cerebrovascular accident 12/12/2012  . Gait instability 12/12/2012  . Contracture of joint 12/12/2012  . Hemiplegia of dominant side as late effect following cerebrovascular disease ([place]) 12/12/2012  . Anticoagulated on Coumadin 09/19/2011  . Cardiomyopathy in other diseases classified elsewhere 08/24/2011  . (L) MCA cardio embolic stroke  47/42/5956  . Dyslipidemia 06/25/2011  . H/O tobacco use 06/25/2011  . Weakness 05/28/2011  . History of CVA (cerebrovascular accident) 05/28/2011  . HTN (hypertension) 05/28/2011  . HLD (hyperlipidemia) 05/28/2011  . Seizure disorder ([place]) 05/28/2011    Carey Bullocks, OTR/L 2014-11-28, 2:35 PM  [facility] [address] [place], [place], [address] Phone: [phone]   Fax:  [phone]  Name: [name] MRN: [id number] Date of Birth: 11-13-1950

## 2014-12-02 ENCOUNTER — Ambulatory Visit
Admission: RE | Admit: 2014-12-02 | Discharge: 2014-12-02 | Disposition: A | Discharge status: Home / Self Care | Payer: Commercial Managed Care - HMO | Source: Ambulatory Visit | Attending: Family Medicine | Admitting: Family Medicine

## 2014-12-02 DIAGNOSIS — M810 Age-related osteoporosis without current pathological fracture: Secondary | ICD-10-CM | POA: Diagnosis not present

## 2014-12-02 DIAGNOSIS — R252 Cramp and spasm: Secondary | ICD-10-CM

## 2014-12-02 DIAGNOSIS — Z13820 Encounter for screening for osteoporosis: Secondary | ICD-10-CM

## 2014-12-02 DIAGNOSIS — M2450 Contracture, unspecified joint: Secondary | ICD-10-CM

## 2014-12-02 DIAGNOSIS — Z1231 Encounter for screening mammogram for malignant neoplasm of breast: Secondary | ICD-10-CM | POA: Diagnosis not present

## 2014-12-02 DIAGNOSIS — Z780 Asymptomatic menopausal state: Secondary | ICD-10-CM

## 2014-12-02 DIAGNOSIS — Z1239 Encounter for other screening for malignant neoplasm of breast: Secondary | ICD-10-CM

## 2014-12-02 DIAGNOSIS — R2681 Unsteadiness on feet: Secondary | ICD-10-CM

## 2014-12-03 ENCOUNTER — Other Ambulatory Visit: Payer: Self-pay | Admitting: Family Medicine

## 2014-12-03 ENCOUNTER — Encounter: Payer: Self-pay | Admitting: Family Medicine

## 2014-12-03 DIAGNOSIS — M810 Age-related osteoporosis without current pathological fracture: Secondary | ICD-10-CM

## 2014-12-03 MED ORDER — ALENDRONATE SODIUM 70 MG PO TABS
70.0000 mg | ORAL_TABLET | ORAL | Status: DC
Start: 2014-12-03 — End: 2015-10-23

## 2015-01-06 DIAGNOSIS — I639 Cerebral infarction, unspecified: Secondary | ICD-10-CM | POA: Diagnosis not present

## 2015-01-06 DIAGNOSIS — Z7901 Long term (current) use of anticoagulants: Secondary | ICD-10-CM | POA: Diagnosis not present

## 2015-02-17 DIAGNOSIS — Z7901 Long term (current) use of anticoagulants: Secondary | ICD-10-CM | POA: Diagnosis not present

## 2015-02-17 DIAGNOSIS — I639 Cerebral infarction, unspecified: Secondary | ICD-10-CM | POA: Diagnosis not present

## 2015-03-29 DIAGNOSIS — I639 Cerebral infarction, unspecified: Secondary | ICD-10-CM | POA: Diagnosis not present

## 2015-03-29 DIAGNOSIS — Z7901 Long term (current) use of anticoagulants: Secondary | ICD-10-CM | POA: Diagnosis not present

## 2015-05-10 DIAGNOSIS — I639 Cerebral infarction, unspecified: Secondary | ICD-10-CM | POA: Diagnosis not present

## 2015-05-10 DIAGNOSIS — Z7901 Long term (current) use of anticoagulants: Secondary | ICD-10-CM | POA: Diagnosis not present

## 2015-06-29 DIAGNOSIS — Z7901 Long term (current) use of anticoagulants: Secondary | ICD-10-CM | POA: Diagnosis not present

## 2015-06-29 DIAGNOSIS — I639 Cerebral infarction, unspecified: Secondary | ICD-10-CM | POA: Diagnosis not present

## 2015-08-11 DIAGNOSIS — Z7901 Long term (current) use of anticoagulants: Secondary | ICD-10-CM | POA: Diagnosis not present

## 2015-08-11 DIAGNOSIS — I639 Cerebral infarction, unspecified: Secondary | ICD-10-CM | POA: Diagnosis not present

## 2015-09-22 DIAGNOSIS — I639 Cerebral infarction, unspecified: Secondary | ICD-10-CM | POA: Diagnosis not present

## 2015-09-22 DIAGNOSIS — Z7901 Long term (current) use of anticoagulants: Secondary | ICD-10-CM | POA: Diagnosis not present

## 2015-10-23 ENCOUNTER — Other Ambulatory Visit: Payer: Self-pay | Admitting: Family Medicine

## 2015-10-25 NOTE — Telephone Encounter (Signed)
Last ov and lab  09/2014 Due for exam

## 2015-10-27 NOTE — Telephone Encounter (Signed)
PATIENT'S HUSBAND ([name]) CALLED TO LET DR. SHAW KNOW THAT HIS WIFE NEEDS A 90 DAY SUPPLY OF ALL HER MEDICATIONS: DIGOXIN 0.25 MG, BACLOFEN 20 MG, WARFARIN 5 MG, LISINOPRIL 10 MG, ATORVASTATIN 80 MG, LEVETIRACETAM 500 MG, CARVEDILOL 6.25 MG AND ALENDRONATE 70 MG. HE WOULD LIKE THEM SENT TO [facility].  (I SAW WHERE KAREN SAID LAST OV AND LAB 09/2014 AND DUE FOR EXAM, BUT I DIDN'T KNOW IF SHE WAS ASKING DR. SHAW OR IF THIS WAS WHAT I NEEDED TO TELL THE PATIENT?) BEST PHONE [phone] (HUSBAND IS [name] [place].)  [place]

## 2015-10-28 NOTE — Telephone Encounter (Signed)
Refills sent but please call Alfonzo have pt sched an annual wellness visit with me with fasting labs sometime within the next 6 mos prior to needing any additional refills.  Last OV was wellness visit 09/2014.

## 2015-10-31 ENCOUNTER — Telehealth: Payer: Self-pay

## 2015-10-31 NOTE — Telephone Encounter (Signed)
Notified husband of Dr Raul Del message. He agreed.

## 2015-10-31 NOTE — Telephone Encounter (Signed)
Pt is calling to check on the refill request of several medications  That was put in last week   The fax

## 2015-11-02 DIAGNOSIS — Z7901 Long term (current) use of anticoagulants: Secondary | ICD-10-CM | POA: Diagnosis not present

## 2015-11-02 DIAGNOSIS — I639 Cerebral infarction, unspecified: Secondary | ICD-10-CM | POA: Diagnosis not present

## 2015-12-20 DIAGNOSIS — Z7901 Long term (current) use of anticoagulants: Secondary | ICD-10-CM | POA: Diagnosis not present

## 2015-12-20 DIAGNOSIS — I639 Cerebral infarction, unspecified: Secondary | ICD-10-CM | POA: Diagnosis not present

## 2016-01-31 DIAGNOSIS — I639 Cerebral infarction, unspecified: Secondary | ICD-10-CM | POA: Diagnosis not present

## 2016-01-31 DIAGNOSIS — Z7901 Long term (current) use of anticoagulants: Secondary | ICD-10-CM | POA: Diagnosis not present

## 2016-03-13 DIAGNOSIS — Z7901 Long term (current) use of anticoagulants: Secondary | ICD-10-CM | POA: Diagnosis not present

## 2016-03-13 DIAGNOSIS — I639 Cerebral infarction, unspecified: Secondary | ICD-10-CM | POA: Diagnosis not present

## 2016-04-02 ENCOUNTER — Ambulatory Visit: Payer: Commercial Managed Care - HMO | Admitting: Family Medicine

## 2016-04-04 NOTE — Progress Notes (Addendum)
Subjective:    Patient ID: [name], female    DOB: 07-30-1950, 66 y.o.   MRN: [id number] Chief Complaint  Patient presents with  . Medication Refill    all  . Follow-up    HPI [name] is a delightful 66 yo woman who is here today with her husband [name] for refills of her routine medications. I have not seen her in 18 months.    S/p CVA: [name] suffered a severe stroke in 12/2007 resulting in right hemiplegia and severe dysarthria so her husband often has to speak for her and help interpret her statements.  Last saw SLP at [facility] o/p center in 03/2014 recommended to pt that she return to [place] since she did over 2 yrs of therapy there ending in 2014.  Unfortunately, when they gave her a yes/no board, pt only answered simple logic and basic biographical questions correctly 40-50% of the time.  Through a program at [facility] speech therapy, pt was given an ipad to communicate with which pt does not use.  Husband thinks pt gets confused to easily and frustrated to remember how to do it so no desire to f/u w/ [employer] to see if better equipment.    Anticoag due to h/o CVA: On warfarin. INR followed at [facility] clinic.  No sig problems.  Hemplegia:  Last round of home PT was twice a week about 2 yrs ago.  She often has right foot pain and does have a rt foot splint from the podiatrist.  Baclofen has helped some so taking bid as well as prn, hydrocodone provides minimal relief.  S/o do: Had a sz before CVA so unsure if risk for recurrence. When she accidentally missed a few days of Keppra, there was a concern she had a focal sz and pt has been very stable on Keppra 500 bid so continue indefinitely.  CHF and HTN, HPL:  Stable on lisinopril 10, carvedilol 6.25 bid, lipitor 80 (swiched from pravastatin), and digoxin 0.25mg  qd. Lipids at goal.  Cardiologist is Dr. Einar Gip who thought initial reduced EF of 15-20% was likely secondary to stress during CVA (see scanned  note in 03/2012) and pt likely did NOT have CHF - repeated echo which showed grade 1 diastolic, EF was 48% so no further f/u needed unless pt developed changes, signs of fluid overload, etc.  She is fasting today.  Vit D Def: vit D nml @ 37 > 2 yrs ago Osteoporosis: DEXA scan done 12/02/14 showed T score -2.5 in left femur so patient was started on fosamax and advised on calcium/vitamin D supp.  no recent h/o UTI - last was 7 yrs ago  GI: Had routine screening colonoscopy 05/2012 by Dr. Sharlett Iles - repeat in 5 years due to adenomatous polyp.  Uses miralax reg w/ prn colace for chronic constipation. Did have a blood stool once the other day but they had stopped the stool softener.  She doesn't like to take it. Happened 4 years.   c/o right foot and knee pain, knee swelling c/o vagincal discharge gblood stool  Depression screen [facility] 2/9 04/05/2016 09/30/2014 12/12/2012  Decreased Interest 0 0 0  Down, Depressed, Hopeless 0 0 1  PHQ - 2 Score 0 0 1   Past Medical History:  Diagnosis Date  . Adenomatous polyp of colon 09/2012   repeat colonoscopy in 5 years by Dr. Sharlett Iles  . CHF (congestive heart failure) ([place])    EF 15-20% as of 05/28/11 (  Dr Legrand [facility] D/C summary)  . CVA (cerebral infarction)   . Hemiparesis ([place])   . Hyperlipidemia   . Hypertension   . Seizures ([place])   . Stroke ([place])   . Vitamin D deficiency 2010   Past Surgical History:  Procedure Laterality Date  . ABDOMINAL HYSTERECTOMY    . FRACTURE SURGERY    . HIP SURGERY    . TUBAL LIGATION     Current Outpatient Prescriptions on File Prior to Visit  Medication Sig Dispense Refill  . baclofen (LIORESAL) 20 MG tablet TAKE 1 TABLET THREE TIMES DAILY (MAY TAKE AN ADDITIONAL TABLET MID-DAY OR BEFORE BED AS NEEDED FOR MUSCLE SPASMS) 360 tablet 1  . docusate sodium (COLACE) 50 MG capsule Take 1 capsule (50 mg total) by mouth daily. 90 capsule 3  . Liniments (HEET TRIPLE ACTION EX) Apply 1 application topically as  needed (for pain).    . polyethylene glycol (MIRALAX / GLYCOLAX) packet Take 17 g by mouth daily as needed for moderate constipation.    . triamcinolone cream (KENALOG) 0.1 % Apply 1 application topically 2 (two) times daily. (Patient not taking: Reported on 04/05/2016) 80 g 0  . zoster vaccine live, PF, (ZOSTAVAX) 16109 UNT/0.65ML injection Inject 19,400 Units into the skin once. (Patient not taking: Reported on 04/05/2016) 1 vial 0   No current facility-administered medications on file prior to visit.    Not on File Family History  Problem Relation Age of Onset  . Diabetes Daughter   . Colon cancer Maternal Grandfather    Social History   Social History  . Marital status: Married    Spouse name: N/A  . Number of children: 2  . Years of education: 12   Occupational History  .  Retired   Social History Main Topics  . Smoking status: Former Smoker    Types: Cigarettes    Quit date: 09/26/2007  . Smokeless tobacco: Never Used  . Alcohol use No  . Drug use: No  . Sexual activity: No   Other Topics Concern  . None   Social History Narrative  . None     Review of Systems  Constitutional: Positive for unexpected weight change. Negative for activity change, appetite change, chills, diaphoresis, fatigue and fever.  Respiratory: Negative for cough and shortness of breath.   Cardiovascular: Negative for leg swelling.  Genitourinary: Positive for vaginal discharge.  Musculoskeletal: Positive for arthralgias, back pain, gait problem and myalgias. Negative for joint swelling.  Neurological: Positive for speech difficulty and weakness. Negative for seizures and syncope.  Hematological: Does not bruise/bleed easily.  Psychiatric/Behavioral: Positive for agitation, confusion and decreased concentration. Negative for dysphoric mood. The patient is not nervous/anxious.    See hpi    Objective:   Physical Exam  Constitutional: She appears well-developed and well-nourished. No  distress.  HENT:  Head: Normocephalic and atraumatic.  Right Ear: External ear normal.  Left Ear: External ear normal.  Eyes: Conjunctivae are normal. No scleral icterus.  Neck: Normal range of motion. Neck supple. No thyromegaly present.  Cardiovascular: Normal rate, regular rhythm, normal heart sounds and intact distal pulses.   Pulmonary/Chest: Effort normal and breath sounds normal. No respiratory distress.  Musculoskeletal: She exhibits no edema.  Lymphadenopathy:    She has no cervical adenopathy.  Neurological: She is alert. She is not disoriented. She displays atrophy. A cranial nerve deficit is present. She exhibits abnormal muscle tone. Coordination and gait abnormal.  Skin: Skin is warm and dry. She is  not diaphoretic. No erythema.  Psychiatric: Her affect is blunt. Her speech is delayed and slurred. She is slowed. Cognition and memory are impaired. She exhibits abnormal recent memory.    BP 118/65   Pulse 68   Temp 98.3 F (36.8 C) (Oral)   Resp 16   Ht 5\' 6"  (1.676 m)   Wt 130 lb (59 kg)   SpO2 98%   BMI 20.98 kg/m      Assessment & Plan:  sched AWV next wk to eval bloody stool and vag d/c. Vit D, cmp, lipid, digoxin, cbc Therapeutic digoxin level 0.5-0.9 ng/mL, toxic >2 - should be drawn just before next dose or >6 hrs after last dose. Needs pneumovax-25 and flu shot Refill baclofen prn x 6 mo supply Refer back to OT for new splint - is torn  1. Essential hypertension   2. Dysarthria due to cerebrovascular accident ([place])   3. Hemiplegia of dominant side as late effect following cerebrovascular disease ([place])   4. Seizure disorder ([place])   5. Localized osteoporosis without current pathological fracture   6. Vitamin D deficiency   7. Hyperlipidemia, unspecified hyperlipidemia type   8. Anticoagulated on Coumadin - cont to follow w/ [facility] coumadin clinic  9. Encounter for long-term (current) use of high-risk medication   10. Need for prophylactic vaccination and  inoculation against influenza   11. Stool bloody  - suspect hemorrhoids as was years prior but asked pt to RTC in 1 wk for eval - she discussed this and vag discharge at the end of her visit and it takes sig time for pt to get prepared and perform pelvic/rectal exam due to the hemiplegia and dysarthria so asked pt to RTC for another appt asap - within 1 wk for pelvic/rectal exam to eval vag d/c and bloody stool complaint.   ADDENDUM: Pt on digoxin from reduced EF after CVA.  No h/o a. Fib that I am aware of.  Pt did take labs this morning but is fasting. Digoxin level is supposed to be drawn at minimum 6 hours after dose and ideally 12-24 hrs so is likely falsely elevated but was still >200% x ULN so had pt reduce dig dose from 0.25mg  qd to 0.125mg  qd - dose and level are linear.  Likely pt may not even need to be on dig as Dr. Irven Shelling last note suspected that the reduced EF at the time of the CVA was due to extremes stress and EF had returned no normal.  Recheck level at appt in 9d.  LAB ADDENDUM: Called and spoke to husband [name]. Pt has had normal med compliance, eating well, no n/v. Did take digoxin approx 2-4 hrs before blood draw and was otherwise fasting.  Will cut digoxin dose in half to 0.125 mg daily. Patient has appointment with me in 9 days for pelvic exam so we will recheck level at that time. Advised to not take the digoxin medication that morning as level is best drawn when in a steady state 7-10 days after dose change.  Orders Placed This Encounter  Procedures  . Flu Vaccine QUAD 36+ mos IM  . Pneumococcal polysaccharide vaccine 23-valent greater than or equal to 2yo subcutaneous/IM  . Lipid panel    Order Specific Question:   Has the patient fasted?    Answer:   Yes  . Comprehensive metabolic panel    Order Specific Question:   Has the patient fasted?    Answer:   Yes  . VITAMIN D  25 Hydroxy (Vit-D Deficiency, Fractures)  . Digoxin level  . CBC  . Care order/instruction:     Scheduling Instructions:     Complete orders, AVS and go.  Marland Kitchen POC Hemoccult Bld/Stl (3-Cd Home Screen)    Standing Status:   Future    Standing Expiration Date:   04/05/2017    Meds ordered this encounter  Medications  . atorvastatin (LIPITOR) 80 MG tablet    Sig: Take 1 tablet (80 mg total) by mouth at bedtime.    Dispense:  90 tablet    Refill:  3  . alendronate (FOSAMAX) 70 MG tablet    Sig: TAKE 1 TABLET EVERY 7 DAYS WITH FULL GLASS OF WATER ON AN EMPTY STOMACH    Dispense:  12 tablet    Refill:  3  . carvedilol (COREG) 6.25 MG tablet    Sig: Take 1 tablet (6.25 mg total) by mouth 2 (two) times daily.    Dispense:  180 tablet    Refill:  3  . digoxin (LANOXIN) 0.125 MG tablet    Sig: Take 1 tablet (125 mcg total) by mouth daily.    Dispense:  90 tablet    Refill:  0  . levETIRAcetam (KEPPRA) 500 MG tablet    Sig: Take 1 tablet (500 mg total) by mouth 2 (two) times daily.    Dispense:  180 tablet    Refill:  3  . lisinopril (PRINIVIL,ZESTRIL) 10 MG tablet    Sig: Take 1 tablet (10 mg total) by mouth daily.    Dispense:  90 tablet    Refill:  3  . warfarin (COUMADIN) 5 MG tablet    Sig: TAKE AS DIRECTED BY COUMADIN CLINIC    Dispense:  90 tablet    Refill:  1     Delman Cheadle, M.D.  Primary Care at [facility] [address] [place], [place] [address] [phone] phone [phone] fax  04/08/16 12:46 AM

## 2016-04-05 ENCOUNTER — Encounter: Payer: Self-pay | Admitting: Family Medicine

## 2016-04-05 ENCOUNTER — Ambulatory Visit (INDEPENDENT_AMBULATORY_CARE_PROVIDER_SITE_OTHER): Payer: Medicare HMO | Admitting: Family Medicine

## 2016-04-05 VITALS — BP 118/65 | HR 68 | Temp 98.3°F | Resp 16 | Ht 66.0 in | Wt 130.0 lb

## 2016-04-05 DIAGNOSIS — R531 Weakness: Secondary | ICD-10-CM

## 2016-04-05 DIAGNOSIS — E559 Vitamin D deficiency, unspecified: Secondary | ICD-10-CM | POA: Diagnosis not present

## 2016-04-05 DIAGNOSIS — G40909 Epilepsy, unspecified, not intractable, without status epilepticus: Secondary | ICD-10-CM

## 2016-04-05 DIAGNOSIS — Z7901 Long term (current) use of anticoagulants: Secondary | ICD-10-CM | POA: Diagnosis not present

## 2016-04-05 DIAGNOSIS — K921 Melena: Secondary | ICD-10-CM

## 2016-04-05 DIAGNOSIS — I69959 Hemiplegia and hemiparesis following unspecified cerebrovascular disease affecting unspecified side: Secondary | ICD-10-CM | POA: Diagnosis not present

## 2016-04-05 DIAGNOSIS — Z79899 Other long term (current) drug therapy: Secondary | ICD-10-CM

## 2016-04-05 DIAGNOSIS — I10 Essential (primary) hypertension: Secondary | ICD-10-CM

## 2016-04-05 DIAGNOSIS — Z5181 Encounter for therapeutic drug level monitoring: Secondary | ICD-10-CM | POA: Diagnosis not present

## 2016-04-05 DIAGNOSIS — E785 Hyperlipidemia, unspecified: Secondary | ICD-10-CM | POA: Diagnosis not present

## 2016-04-05 DIAGNOSIS — I639 Cerebral infarction, unspecified: Secondary | ICD-10-CM

## 2016-04-05 DIAGNOSIS — Z23 Encounter for immunization: Secondary | ICD-10-CM

## 2016-04-05 DIAGNOSIS — R471 Dysarthria and anarthria: Secondary | ICD-10-CM | POA: Diagnosis not present

## 2016-04-05 DIAGNOSIS — M816 Localized osteoporosis [Lequesne]: Secondary | ICD-10-CM | POA: Diagnosis not present

## 2016-04-05 DIAGNOSIS — IMO0002 Reserved for concepts with insufficient information to code with codable children: Secondary | ICD-10-CM

## 2016-04-05 NOTE — Patient Instructions (Addendum)
     IF you received an x-ray today, you will receive an invoice from [facility]. Please contact [facility] at [phone] with questions or concerns regarding your invoice.   IF you received labwork today, you will receive an invoice from [facility]. Please contact [facility] at [phone] with questions or concerns regarding your invoice.   Our billing staff will not be able to assist you with questions regarding bills from these companies.  You will be contacted with the lab results as soon as they are available. The fastest way to get your results is to activate your My Chart account. Instructions are located on the last page of this paperwork. If you have not heard from [place] regarding the results in 2 weeks, please contact this office.      Constipation, Adult Constipation is when a person has fewer bowel movements in a week than normal, has difficulty having a bowel movement, or has stools that are dry, hard, or larger than normal. Constipation may be caused by an underlying condition. It may become worse with age if a person takes certain medicines and does not take in enough fluids. Follow these instructions at home: Eating and drinking    Eat foods that have a lot of fiber, such as fresh fruits and vegetables, whole grains, and beans.  Limit foods that are high in fat, low in fiber, or overly processed, such as french fries, hamburgers, cookies, candies, and soda.  Drink enough fluid to keep your urine clear or pale yellow. General instructions   Exercise regularly or as told by your health care provider.  Go to the restroom when you have the urge to go. Do not hold it in.  Take over-the-counter and prescription medicines only as told by your health care provider. These include any fiber supplements.  Practice pelvic floor retraining exercises, such as deep breathing while relaxing the lower abdomen and pelvic floor relaxation during bowel  movements.  Watch your condition for any changes.  Keep all follow-up visits as told by your health care provider. This is important. Contact a health care provider if:  You have pain that gets worse.  You have a fever.  You do not have a bowel movement after 4 days.  You vomit.  You are not hungry.  You lose weight.  You are bleeding from the anus.  You have thin, pencil-like stools. Get help right away if:  You have a fever and your symptoms suddenly get worse.  You leak stool or have blood in your stool.  Your abdomen is bloated.  You have severe pain in your abdomen.  You feel dizzy or you faint. This information is not intended to replace advice given to you by your health care provider. Make sure you discuss any questions you have with your health care provider. Document Released: 10/07/2003 Document Revised: 07/29/2015 Document Reviewed: 06/29/2015 Elsevier Interactive Patient Education  2017 [employer].

## 2016-04-06 LAB — LIPID PANEL
CHOL/HDL RATIO: 3.7 ratio (ref 0.0–4.4)
CHOLESTEROL TOTAL: 126 mg/dL (ref 100–199)
HDL: 34 mg/dL — AB (ref >39)
LDL CALC: 66 mg/dL (ref 0–99)
TRIGLYCERIDES: 128 mg/dL (ref 0–149)
VLDL Cholesterol Cal: 26 mg/dL (ref 5–40)

## 2016-04-06 LAB — VITAMIN D 25 HYDROXY (VIT D DEFICIENCY, FRACTURES): Vit D, 25-Hydroxy: 23.8 ng/mL — ABNORMAL LOW (ref 30.0–100.0)

## 2016-04-06 LAB — COMPREHENSIVE METABOLIC PANEL
A/G RATIO: 1.3 (ref 1.2–2.2)
ALT: 17 IU/L (ref 0–32)
AST: 25 IU/L (ref 0–40)
Albumin: 4.3 g/dL (ref 3.6–4.8)
Alkaline Phosphatase: 134 IU/L — ABNORMAL HIGH (ref 39–117)
BILIRUBIN TOTAL: 0.3 mg/dL (ref 0.0–1.2)
BUN/Creatinine Ratio: 16 (ref 12–28)
BUN: 15 mg/dL (ref 8–27)
CHLORIDE: 101 mmol/L (ref 96–106)
CO2: 24 mmol/L (ref 18–29)
Calcium: 9.6 mg/dL (ref 8.7–10.3)
Creatinine, Ser: 0.93 mg/dL (ref 0.57–1.00)
GFR, EST AFRICAN AMERICAN: 74 mL/min/{1.73_m2} (ref >59)
GFR, EST NON AFRICAN AMERICAN: 64 mL/min/{1.73_m2} (ref >59)
GLOBULIN, TOTAL: 3.4 g/dL (ref 1.5–4.5)
Glucose: 99 mg/dL (ref 65–99)
POTASSIUM: 5.1 mmol/L (ref 3.5–5.2)
SODIUM: 142 mmol/L (ref 134–144)
TOTAL PROTEIN: 7.7 g/dL (ref 6.0–8.5)

## 2016-04-06 LAB — CBC
Hematocrit: 39.8 % (ref 34.0–46.6)
Hemoglobin: 13.4 g/dL (ref 11.1–15.9)
MCH: 27.8 pg (ref 26.6–33.0)
MCHC: 33.7 g/dL (ref 31.5–35.7)
MCV: 83 fL (ref 79–97)
PLATELETS: 341 10*3/uL (ref 150–379)
RBC: 4.82 x10E6/uL (ref 3.77–5.28)
RDW: 14.7 % (ref 12.3–15.4)
WBC: 8.8 10*3/uL (ref 3.4–10.8)

## 2016-04-06 LAB — DIGOXIN LEVEL: Digoxin, Serum: 2.7 ng/mL (ref 0.5–0.9)

## 2016-04-08 MED ORDER — DIGOXIN 125 MCG PO TABS: 125.0000 ug | ORAL_TABLET | Freq: Every day | ORAL | 0 refills | Status: DC

## 2016-04-08 MED ORDER — LISINOPRIL 10 MG PO TABS: 10.0000 mg | ORAL_TABLET | Freq: Every day | ORAL | 3 refills | Status: DC

## 2016-04-08 MED ORDER — ATORVASTATIN CALCIUM 80 MG PO TABS: 80.0000 mg | ORAL_TABLET | Freq: Every day | ORAL | 3 refills | Status: DC

## 2016-04-08 MED ORDER — LEVETIRACETAM 500 MG PO TABS: 500.0000 mg | ORAL_TABLET | Freq: Two times a day (BID) | ORAL | 3 refills | Status: DC

## 2016-04-08 MED ORDER — ALENDRONATE SODIUM 70 MG PO TABS: ORAL_TABLET | ORAL | 3 refills | Status: DC

## 2016-04-08 MED ORDER — WARFARIN SODIUM 5 MG PO TABS: ORAL_TABLET | ORAL | 1 refills | Status: DC

## 2016-04-08 MED ORDER — CARVEDILOL 6.25 MG PO TABS: 6.2500 mg | ORAL_TABLET | Freq: Two times a day (BID) | ORAL | 3 refills | Status: DC

## 2016-04-14 NOTE — Progress Notes (Signed)
Patient ID: [name], female   DOB: Aug 28, 1950, 66 y.o.   MRN: [id number] Subjective:    [name] is a 66 y.o. female who presents for Medicare Annual/Subsequent preventive examination.  I saw her 10d prior for medication refills.  Her last AWV was 18 mos ago.  Preventive Screening-Counseling & Management  Tobacco History  Smoking Status  . Former Smoker  . Types: Cigarettes  . Quit date: 09/26/2007  Smokeless Tobacco  . Never Used     Problems Prior to Visit S/p CVA: [name] suffered a severe stroke in 12/2007 resulting in right hemiplegia and severe dysarthria so her husband often has to speak for her and help interpret her statements.  Last saw SLP at [facility] o/p center in 03/2014 recommended to pt that she return to [place] since she did over 2 yrs of therapy there ending in 2014. Unfortunately, when they gave her a yes/no board, pt only answered simple logic and basic biographical questions correctly 40-50% of the time. Through a program at [facility] speech therapy, pt was given an ipad to communicate with which pt does not use.  Husband thinks pt gets confused to easily and frustrated to remember how to do it so no desire to f/u w/ [employer] to see if better equipment.   Anticoag due to h/o CVA: On warfarin. INR followed at [facility] Coumadin clinic. No sig problems.  Rt Hemplegia s/p CVA: Last round of home PT was twice a week about 2 yrs ago. She often has right foot pain and does have a rt foot splint from the podiatrist. Baclofen has helped some so taking bid as well as prn, hydrocodone provides minimal relief.  S/o do: Had a sz before CVA so unsure if risk for recurrence. When she accidentally missed a few days of Keppra, there was a concern she had a focal sz and pt has been very stable on Keppra 500 bid so continue indefinitely.  HTN, HPL with H/o CHF : Stable on lisinopril 10, carvedilol 6.25 bid, lipitor 80 (swiched from pravastatin), and  digoxin 0.25mg  qd.Cardiologist is Dr. Einar Gip who thought initial reduced EF of 15-20% was likely secondary to stress during CVA (see scanned note in 03/2012) and pt likely did NOT have CHF - repeated echo which showed grade 1 diastolic, EF was 48% so no further f/u needed unless pt developed changes, signs of fluid overload, etc. Her digoxin level last week was drawn just several hr after taking her meds on an empty stomach (so NOT at the nadir of ideally 12 hrs after dose) and was critically to high so had pt reduce to 1/2 tab/d and not take this morning for more accurate level. She had fasting labs last week. Lipids at goal.    Vit D Def: vit D nml @ 37 > 2 yrs ago Osteoporosis: DEXA scan done 12/02/14 showed T score -2.5 in left femur so patient was started on fosamax and advised on calcium/vitamin D supp.  no recent h/o UTI - last was 7 yrs ago  GI: Had routine screening colonoscopy 05/2012 by Dr. Sharlett Iles - repeat in 5 years due to adenomatous polyp. Uses miralax reg w/ prn colace for chronic constipation. Did have a blood stool once the other day but they had stopped the stool softener so assume due to hemorrhoid as it had sev yrs prior. BRBPR was self-limited once.  She doesn't like to take the stool softener so Alfonzo sometimes gets worn out from  pressing it on her.   Current Problems (verified) Patient Active Problem List   Diagnosis Date Noted  . Osteoporosis 12/03/2014  . Vitamin D deficiency 12/19/2012  . Dysarthria due to cerebrovascular accident ([place]) 12/12/2012  . Gait instability 12/12/2012  . Contracture of joint 12/12/2012  . Hemiplegia of dominant side as late effect following cerebrovascular disease ([place]) 12/12/2012  . Anticoagulated on Coumadin 09/19/2011  . Cardiomyopathy in other diseases classified elsewhere 08/24/2011  . (L) MCA cardio embolic stroke  36/64/4034  . Dyslipidemia 06/25/2011  . H/O tobacco use 06/25/2011  . Weakness 05/28/2011  . History of CVA  (cerebrovascular accident) 05/28/2011  . HTN (hypertension) 05/28/2011  . HLD (hyperlipidemia) 05/28/2011  . Seizure disorder ([place]) 05/28/2011    Medications Prior to Visit Current Outpatient Prescriptions on File Prior to Visit  Medication Sig Dispense Refill  . alendronate (FOSAMAX) 70 MG tablet TAKE 1 TABLET EVERY 7 DAYS WITH FULL GLASS OF WATER ON AN EMPTY STOMACH 12 tablet 3  . atorvastatin (LIPITOR) 80 MG tablet Take 1 tablet (80 mg total) by mouth at bedtime. 90 tablet 3  . baclofen (LIORESAL) 20 MG tablet TAKE 1 TABLET THREE TIMES DAILY (MAY TAKE AN ADDITIONAL TABLET MID-DAY OR BEFORE BED AS NEEDED FOR MUSCLE SPASMS) 360 tablet 1  . carvedilol (COREG) 6.25 MG tablet Take 1 tablet (6.25 mg total) by mouth 2 (two) times daily. 180 tablet 3  . digoxin (LANOXIN) 0.125 MG tablet Take 1 tablet (125 mcg total) by mouth daily. 90 tablet 0  . docusate sodium (COLACE) 50 MG capsule Take 1 capsule (50 mg total) by mouth daily. 90 capsule 3  . levETIRAcetam (KEPPRA) 500 MG tablet Take 1 tablet (500 mg total) by mouth 2 (two) times daily. 180 tablet 3  . Liniments (HEET TRIPLE ACTION EX) Apply 1 application topically as needed (for pain).    Marland Kitchen lisinopril (PRINIVIL,ZESTRIL) 10 MG tablet Take 1 tablet (10 mg total) by mouth daily. 90 tablet 3  . polyethylene glycol (MIRALAX / GLYCOLAX) packet Take 17 g by mouth daily as needed for moderate constipation.    . triamcinolone cream (KENALOG) 0.1 % Apply 1 application topically 2 (two) times daily. (Patient not taking: Reported on 04/05/2016) 80 g 0  . warfarin (COUMADIN) 5 MG tablet TAKE AS DIRECTED BY COUMADIN CLINIC 90 tablet 1  . zoster vaccine live, PF, (ZOSTAVAX) 74259 UNT/0.65ML injection Inject 19,400 Units into the skin once. (Patient not taking: Reported on 04/05/2016) 1 vial 0   No current facility-administered medications on file prior to visit.     Current Medications (verified) Current Outpatient Prescriptions  Medication Sig Dispense  Refill  . alendronate (FOSAMAX) 70 MG tablet TAKE 1 TABLET EVERY 7 DAYS WITH FULL GLASS OF WATER ON AN EMPTY STOMACH 12 tablet 3  . atorvastatin (LIPITOR) 80 MG tablet Take 1 tablet (80 mg total) by mouth at bedtime. 90 tablet 3  . baclofen (LIORESAL) 20 MG tablet TAKE 1 TABLET THREE TIMES DAILY (MAY TAKE AN ADDITIONAL TABLET MID-DAY OR BEFORE BED AS NEEDED FOR MUSCLE SPASMS) 360 tablet 1  . carvedilol (COREG) 6.25 MG tablet Take 1 tablet (6.25 mg total) by mouth 2 (two) times daily. 180 tablet 3  . digoxin (LANOXIN) 0.125 MG tablet Take 1 tablet (125 mcg total) by mouth daily. 90 tablet 0  . docusate sodium (COLACE) 50 MG capsule Take 1 capsule (50 mg total) by mouth daily. 90 capsule 3  . levETIRAcetam (KEPPRA) 500 MG tablet Take 1 tablet (500  mg total) by mouth 2 (two) times daily. 180 tablet 3  . Liniments (HEET TRIPLE ACTION EX) Apply 1 application topically as needed (for pain).    Marland Kitchen lisinopril (PRINIVIL,ZESTRIL) 10 MG tablet Take 1 tablet (10 mg total) by mouth daily. 90 tablet 3  . polyethylene glycol (MIRALAX / GLYCOLAX) packet Take 17 g by mouth daily as needed for moderate constipation.    . triamcinolone cream (KENALOG) 0.1 % Apply 1 application topically 2 (two) times daily. (Patient not taking: Reported on 04/05/2016) 80 g 0  . warfarin (COUMADIN) 5 MG tablet TAKE AS DIRECTED BY COUMADIN CLINIC 90 tablet 1  . zoster vaccine live, PF, (ZOSTAVAX) 65784 UNT/0.65ML injection Inject 19,400 Units into the skin once. (Patient not taking: Reported on 04/05/2016) 1 vial 0   No current facility-administered medications for this visit.      Allergies (verified) Patient has no allergy information on record.   PAST HISTORY  Family History Family History  Problem Relation Age of Onset  . Diabetes Daughter   . Colon cancer Maternal Grandfather     Social History Social History  Substance Use Topics  . Smoking status: Former Smoker    Types: Cigarettes    Quit date: 09/26/2007  .  Smokeless tobacco: Never Used  . Alcohol use No     Are there smokers in your home (other than you)? No  Risk Factors Current exercise habits: Exercise is limited by neurologic condition(s): hemiplegia, orthopedic condition(s): contractures.  Dietary issues discussed: eats normal diet, weight steady   Cardiac risk factors: advanced age (older than 60 for men, 50 for women) and sedentary lifestyle.  Depression Screen Depression screen [facility] 2/9 04/16/2016 04/05/2016 09/30/2014 12/12/2012  Decreased Interest 0 0 0 0  Down, Depressed, Hopeless 0 0 0 1  PHQ - 2 Score 0 0 0 1    Activities of Daily Living In your present state of health, do you have any difficulty performing the following activities?:  Driving? Yes Managing money?  Yes Feeding yourself? No Getting from bed to chair? Yes Climbing a flight of stairs? Yes Preparing food and eating?: No Bathing or showering? Yes Getting dressed: Yes Getting to the toilet? Yes Using the toilet:No Moving around from place to place: Yes In the past year have you fallen or had a near fall?:No   Are you sexually active?  No  Do you have more than one partner?  No - is married in a monogamous relationship but not sexually active due to hemiplegia and contractures  Hearing Difficulties: No Do you often ask people to speak up or repeat themselves? No Do you experience ringing or noises in your ears? Yes Do you have difficulty understanding soft or whispered voices? No   Do you feel that you have a problem with memory? Yes  Do you often misplace items? No  Do you feel safe at home?  Yes  Cognitive Testing  Alert? Yes  Normal Appearance?Yes  Oriented to person? Yes  Place? Yes   Time? No  Recall of three objects?  Unable to assess  Can perform simple calculations? Unable to assess  Displays appropriate judgment? Unable to assess   Advanced Directives have been discussed with the patient? Yes  List the Names of Other  Physician/Practitioners you currently use: 1.    Indicate any recent Medical Services you may have received from other than [facility] providers in the past year (date may be approximate).  Immunization History  Administered Date(s) Administered  .  Influenza,inj,Quad PF,36+ Mos 12/12/2012, 10/23/2013, 09/30/2014, 04/05/2016  . Pneumococcal Conjugate-13 09/30/2014  . Pneumococcal Polysaccharide-23 04/05/2016  . Tdap 12/12/2012    Screening Tests Health Maintenance  Topic Date Due  . Hepatitis C Screening  10-14-1950  . MAMMOGRAM  12/01/2016  . COLONOSCOPY  10/21/2017  . TETANUS/TDAP  12/13/2022  . INFLUENZA VACCINE  Completed  . DEXA SCAN  Completed  . PNA vac Low Risk Adult  Completed    All answers were reviewed with the patient and necessary referrals were made:  Woodson Macha, MD   04/14/2016   History reviewed: allergies, current medications, past family history, past medical history, past social history, past surgical history and problem list  Review of Systems Pertinent items are noted in HPI.    Objective:  BP (!) 149/64   Pulse 74   Temp 98 F (36.7 C) (Oral)   Resp 18   Ht 5\' 6"  (1.676 m)   Wt 123 lb (55.8 kg)   SpO2 97%   BMI 19.85 kg/m   General appearance: alert, cooperative and no distress  Breast exam: normal breast texture with some moderate fibrocystic changes, no axillary adenopathy, nipple normal, breast symmetry and appearance normal. Rt>Lt with hyperpigmented rash spanning in oval shape between lower breast and torso with erythema and exudate collected along inframammary fold with tenderness and moisture. Pelvic: external genitalia normal, no adnexal masses or tenderness, no bladder tenderness, no cervical motion tenderness, perianal skin: no external genital warts noted, positive findings: vaginal discharge:  copious, white, yellow, thick and odorless, uterus normal size, shape, and consistency and vagina normal without discharge Skin: Skin color, texture,  turgor normal. No rashes or lesions  Rectum with numerous large internal and external hemorrhoids, none thrombosed, prolapsed or bleeding.  Hemosure negative.  Right large toe painful to palp - nail slightly thickened with onychomycosis and surrounding skin with some mild erythema and tenderness but no warmth, induration, fluctuance, or drainage C/o pain over right medial malleolus but nml to palpation, skin nml - looks like brace might be causing pain??   Assessment:     1. Medicare annual wellness visit, subsequent   2. Vaginal discharge   3. External hemorrhoid, bleeding   4. Rectal bleeding - due to severe internal and external hemorrhoids but was very self-limited and pt denies sxs the large majority of the time so I think using prn anusol suppositories for now should suffice and will see GI next yr for colnoscopy but refer back sooner to discuss banding/treatment if bleeding becomes a prob.  5. Right foot pain   6. Onychomycosis of right great toe   7. Inflammation of toenail of right foot - recurrent - does not look acute or severe enough to warrant antibiotic treatment at this time but as she also seems to be having ankle pain from brace will refer to podiatry for further eval of both  8. Candidiasis of breast - top nystatin  9. Medication monitoring encounter         Plan:     During the course of the visit the patient was educated and counseled about appropriate screening and preventive services including:    Pneumococcal vaccine - completed, prevnar 2016 at 66 yo, pneumovax last wk  Td vaccine - UTD 2014  Screening electrocardiogram - last baseline done 07/2013, saw Dr. Einar Gip and no acute coronary concerns  Screening mammography - due in Nov 2018, nml in 11/2014  Screening Pap smear and pelvic exam   Bone densitometry screening - +  osteoporosis with T score -2.5 11/2014, started on fosamax - will want to repeat after 11/2016  Colorectal cancer screening - due for  repeat colonoscopy in 1 year 09/2017   Diabetes screening - blood sugars nml  Diet review for nutrition referral? Yes ____  Not Indicated _x___   Patient Instructions (the written plan) was given to the patient.   Orders Placed This Encounter  Procedures  . Wet Prep for [employer], Clue  . Vaginitis/Vaginosis, DNA Probe  . TEST CODE CHANGE  . Specimen status report  . Digoxin, Random, Serum    Standing Status:   Future    Standing Expiration Date:   10/18/2016  . Ambulatory referral to Podiatry    Referral Priority:   Routine    Referral Type:   Consultation    Referral Reason:   Specialty Services Required    Requested Specialty:   Podiatry    Number of Visits Requested:   1  . IFOBT POC (occult bld, rslt in office)    Meds ordered this encounter  Medications  . hydrocortisone (ANUSOL-HC) 25 MG suppository    Sig: Place 1 suppository (25 mg total) rectally 2 (two) times daily as needed for hemorrhoids or itching.    Dispense:  12 suppository    Refill:  2  . nystatin (MYCOSTATIN/NYSTOP) powder    Sig: Apply topically 4 (four) times daily.    Dispense:  60 g    Refill:  3    Delman Cheadle, M.D.  Primary Care at [facility] [address] [place],  [address] [phone] phone [phone] fax  04/17/16 8:15 PM  Medicare Attestation I have personally reviewed: The patient's medical and social history Their use of alcohol, tobacco or illicit drugs Their current medications and supplements The patient's functional ability including ADLs,fall risks, home safety risks, cognitive, and hearing and visual impairment Diet and physical activities Evidence for depression or mood disorders  The patient's weight, height, BMI, and visual acuity have been recorded in the chart.  I have made referrals, counseling, and provided education to the patient based on review of the above and I have provided the patient with a written personalized care plan for  preventive services.     Delman Cheadle, MD   04/14/2016

## 2016-04-16 ENCOUNTER — Ambulatory Visit (INDEPENDENT_AMBULATORY_CARE_PROVIDER_SITE_OTHER): Payer: Medicare HMO | Admitting: Family Medicine

## 2016-04-16 ENCOUNTER — Encounter: Payer: Self-pay | Admitting: Family Medicine

## 2016-04-16 VITALS — BP 149/64 | HR 74 | Temp 98.0°F | Resp 18 | Ht 66.0 in | Wt 123.0 lb

## 2016-04-16 DIAGNOSIS — K625 Hemorrhage of anus and rectum: Secondary | ICD-10-CM | POA: Diagnosis not present

## 2016-04-16 DIAGNOSIS — M79671 Pain in right foot: Secondary | ICD-10-CM

## 2016-04-16 DIAGNOSIS — Z1239 Encounter for other screening for malignant neoplasm of breast: Secondary | ICD-10-CM

## 2016-04-16 DIAGNOSIS — Z5181 Encounter for therapeutic drug level monitoring: Secondary | ICD-10-CM

## 2016-04-16 DIAGNOSIS — B3789 Other sites of candidiasis: Secondary | ICD-10-CM

## 2016-04-16 DIAGNOSIS — Z113 Encounter for screening for infections with a predominantly sexual mode of transmission: Secondary | ICD-10-CM

## 2016-04-16 DIAGNOSIS — B351 Tinea unguium: Secondary | ICD-10-CM

## 2016-04-16 DIAGNOSIS — Z1231 Encounter for screening mammogram for malignant neoplasm of breast: Secondary | ICD-10-CM | POA: Diagnosis not present

## 2016-04-16 DIAGNOSIS — Z0000 Encounter for general adult medical examination without abnormal findings: Secondary | ICD-10-CM | POA: Diagnosis not present

## 2016-04-16 DIAGNOSIS — K644 Residual hemorrhoidal skin tags: Secondary | ICD-10-CM

## 2016-04-16 DIAGNOSIS — N898 Other specified noninflammatory disorders of vagina: Secondary | ICD-10-CM

## 2016-04-16 DIAGNOSIS — L03031 Cellulitis of right toe: Secondary | ICD-10-CM | POA: Diagnosis not present

## 2016-04-16 LAB — IFOBT (OCCULT BLOOD): IFOBT: NEGATIVE

## 2016-04-16 MED ORDER — NYSTATIN 100000 UNIT/GM EX POWD: Freq: Four times a day (QID) | CUTANEOUS | 3 refills | Status: DC

## 2016-04-16 MED ORDER — HYDROCORTISONE ACETATE 25 MG RE SUPP: 25.0000 mg | Freq: Two times a day (BID) | RECTAL | 2 refills | Status: DC | PRN

## 2016-04-16 NOTE — Patient Instructions (Addendum)
Apply the nystatin powder under your breasts twice a day until rash gone and then use as needed to prevent recurrence.  You have severe external hemorrhoids. They don't need to be lanced at this moment but you would be a good candidate to have these banded off I would think so you don't continue to have problems with them - let me know if you would like a referral to surgery or GI to have this done.  Your right toenail has been causing pain and inflammation for a long time so I would like you to see podiatry for this and for the continued pain in the right inside ankle which maybe from your brace continuing to rub.    IF you received an x-ray today, you will receive an invoice from [facility]. Please contact [facility] at [phone] with questions or concerns regarding your invoice.   IF you received labwork today, you will receive an invoice from [place]. Please contact [facility] at [phone] with questions or concerns regarding your invoice.   Our billing staff will not be able to assist you with questions regarding bills from these companies.  You will be contacted with the lab results as soon as they are available. The fastest way to get your results is to activate your My Chart account. Instructions are located on the last page of this paperwork. If you have not heard from [place] regarding the results in 2 weeks, please contact this office.      Skin Yeast Infection Skin yeast infection is a condition in which there is an overgrowth of yeast (candida) that normally lives on the skin. This condition usually occurs in areas of the skin that are constantly warm and moist, such as the armpits or the groin. What are the causes? This condition is caused by a change in the normal balance of the yeast and bacteria that live on the skin. What increases the risk? This condition is more likely to develop in:  People who are obese.  Pregnant women.  Women who take birth  control pills.  People who have diabetes.  People who take antibiotic medicines.  People who take steroid medicines.  People who are malnourished.  People who have a weak defense (immune) system.  People who are 69 years of age or older. What are the signs or symptoms? Symptoms of this condition include:  A red, swollen area of the skin.  Bumps on the skin.  Itchiness. How is this diagnosed? This condition is diagnosed with a medical history and physical exam. Your health care provider may check for yeast by taking light scrapings of the skin to be viewed under a microscope. How is this treated? This condition is treated with medicine. Medicines may be prescribed or be available over-the-counter. The medicines may be:  Taken by mouth (orally).  Applied as a cream. Follow these instructions at home:  Take or apply over-the-counter and prescription medicines only as told by your health care provider.  Eat more yogurt. This may help to keep your yeast infection from returning.  Maintain a healthy weight. If you need help losing weight, talk with your health care provider.  Keep your skin clean and dry.  If you have diabetes, keep your blood sugar under control. Contact a health care provider if:  Your symptoms go away and then return.  Your symptoms do not get better with treatment.  Your symptoms get worse.  Your rash spreads.  You have a fever or chills.  You have new symptoms.  You have new warmth or redness of your skin. This information is not intended to replace advice given to you by your health care provider. Make sure you discuss any questions you have with your health care provider. Document Released: 09/26/2010 Document Revised: 09/04/2015 Document Reviewed: 07/12/2014 Elsevier Interactive Patient Education  2017 [place].  Skin Yeast Infection Skin yeast infection is a condition in which there is an overgrowth of yeast (candida) that normally  lives on the skin. This condition usually occurs in areas of the skin that are constantly warm and moist, such as the armpits or the groin. What are the causes? This condition is caused by a change in the normal balance of the yeast and bacteria that live on the skin. What increases the risk? This condition is more likely to develop in:  People who are obese.  Pregnant women.  Women who take birth control pills.  People who have diabetes.  People who take antibiotic medicines.  People who take steroid medicines.  People who are malnourished.  People who have a weak defense (immune) system.  People who are 32 years of age or older. What are the signs or symptoms? Symptoms of this condition include:  A red, swollen area of the skin.  Bumps on the skin.  Itchiness. How is this diagnosed? This condition is diagnosed with a medical history and physical exam. Your health care provider may check for yeast by taking light scrapings of the skin to be viewed under a microscope. How is this treated? This condition is treated with medicine. Medicines may be prescribed or be available over-the-counter. The medicines may be:  Taken by mouth (orally).  Applied as a cream. Follow these instructions at home:  Take or apply over-the-counter and prescription medicines only as told by your health care provider.  Eat more yogurt. This may help to keep your yeast infection from returning.  Maintain a healthy weight. If you need help losing weight, talk with your health care provider.  Keep your skin clean and dry.  If you have diabetes, keep your blood sugar under control. Contact a health care provider if:  Your symptoms go away and then return.  Your symptoms do not get better with treatment.  Your symptoms get worse.  Your rash spreads.  You have a fever or chills.  You have new symptoms.  You have new warmth or redness of your skin. This information is not intended to  replace advice given to you by your health care provider. Make sure you discuss any questions you have with your health care provider. Document Released: 09/26/2010 Document Revised: 09/04/2015 Document Reviewed: 07/12/2014 Elsevier Interactive Patient Education  2017 [employer].

## 2016-04-17 ENCOUNTER — Telehealth: Payer: Self-pay | Admitting: Family Medicine

## 2016-04-17 LAB — WET PREP FOR TRICH, YEAST, CLUE

## 2016-04-17 LAB — SPECIMEN STATUS REPORT

## 2016-04-17 NOTE — Telephone Encounter (Signed)
I forgot to draw her blood for a digoxin level at her visit so have put in future orders. If she can drop by in the late afternoon or before taking it one morning that would be best. Also need to make sure they don't get charged for the expensive wet prep since we apparently sent over the wrong sample for the reasonably priced one.

## 2016-04-18 LAB — TEST CODE CHANGE

## 2016-04-18 LAB — VAGINITIS/VAGINOSIS, DNA PROBE
CANDIDA SPECIES: NEGATIVE
Gardnerella vaginalis: NEGATIVE
Trichomonas vaginosis: NEGATIVE

## 2016-04-19 NOTE — Telephone Encounter (Signed)
Called and LVM on home phone asking to come in for lab only visit.

## 2016-04-24 DIAGNOSIS — Z7901 Long term (current) use of anticoagulants: Secondary | ICD-10-CM | POA: Diagnosis not present

## 2016-04-24 DIAGNOSIS — I639 Cerebral infarction, unspecified: Secondary | ICD-10-CM | POA: Diagnosis not present

## 2016-04-26 ENCOUNTER — Ambulatory Visit: Payer: Medicare HMO | Attending: Family Medicine | Admitting: Occupational Therapy

## 2016-04-26 DIAGNOSIS — R252 Cramp and spasm: Secondary | ICD-10-CM | POA: Diagnosis not present

## 2016-04-26 DIAGNOSIS — M25641 Stiffness of right hand, not elsewhere classified: Secondary | ICD-10-CM | POA: Diagnosis not present

## 2016-04-26 DIAGNOSIS — I69351 Hemiplegia and hemiparesis following cerebral infarction affecting right dominant side: Secondary | ICD-10-CM | POA: Diagnosis not present

## 2016-04-26 NOTE — Therapy (Signed)
[facility] [address] [place], [place], [address] Phone: [phone]   Fax:  [phone]  Occupational Therapy Evaluation  Patient Details  Name: [name] MRN: [id number] Date of Birth: 01/20/1951 Referring Provider: Dr. Delman Cheadle  Encounter Date: 04/26/2016      OT End of Session - 04/26/16 1145    Visit Number 1   Number of Visits 4   Date for OT Re-Evaluation 05/26/16   Authorization Type [employer] MCR   Authorization - Visit Number 1   Authorization - Number of Visits 10   OT Start Time 5329   OT Stop Time 1140   OT Time Calculation (min) 85 min   Activity Tolerance Patient tolerated treatment well      Past Medical History:  Diagnosis Date  . Adenomatous polyp of colon 09/2012   repeat colonoscopy in 5 years by Dr. Sharlett Iles  . CHF (congestive heart failure) (HCC)    EF 15-20% as of 05/28/11 (Dr [facility] D/[facility] summary)  . CVA (cerebral infarction)   . Hemiparesis ([place])   . Hyperlipidemia   . Hypertension   . Seizures ([place])   . Sickle cell anemia (HCC)   . Stroke ([place])   . Vitamin D deficiency 2010    Past Surgical History:  Procedure Laterality Date  . ABDOMINAL HYSTERECTOMY    . FRACTURE SURGERY    . HIP SURGERY    . TUBAL LIGATION      There were no vitals filed for this visit.      Subjective Assessment - 04/26/16 1026    Patient is accompained by: Family member  husband   Pertinent History CVA 2009   Patient Stated Goals Need new splint   Currently in Pain? No/denies           [facility] OT Assessment - 04/26/16 0001      Assessment   Diagnosis Hemiplegia Rt dominant side  from CVA 2009   Referring Provider Dr. Delman Cheadle   Onset Date --  2009   Assessment Pt returns today for a new resting hand splint (since last one is cracked)    Prior Therapy approx 2 years ago     Balance Screen   Has the patient fallen in the past 6 months No   Has the patient had a  decrease in activity level because of a fear of falling?  No   Is the patient reluctant to leave their home because of a fear of falling?  No     Home  Environment   Additional Comments Pt lives in 1 story home with 1 step to enter   Lives With Spouse     ADL   Eating/Feeding Modified independent   Grooming Modified independent   Upper Body Bathing Moderate assistance   Lower Body Bathing Moderate assistance   Upper Body Dressing Minimal assistance  for bra, zipping jackets   Lower Body Dressing Minimal assistance  assist tying shoes   Toilet Tranfer Modified independent  with grab bar, elevated toilet seat   Toileting - Clothing Manipulation Independent   Toileting -  Hygiene Independent   Tub/Shower Transfer --  sponge bathing, although has tub bench   ADL comments DME: tub bench, grab bars, quad cane, w/c, elevated toilet seat. Pt does some cooking, husband chops/cuts and does cleaning.      Mobility   Mobility Status Needs assist   Mobility Status Comments ambulates with quad cane  Written Expression   Dominant Hand Left  since 2009     Sensation   Additional Comments unable to assess d/t aphasia and cognition     Tone   Assessment Location Right Upper Extremity     ROM / Strength   AROM / PROM / Strength AROM     AROM   Overall AROM Comments LUE A/ROM WNL's. No A/ROM RUE     RUE Tone   RUE Tone Hypertonic     RUE Tone   Hypertonic Details With elbow extension, wrist extension, and finger extension (approx 2/4 MAS elbow and fingers, 3/4 wrist)                  OT Treatments/Exercises (OP) - 04/26/16 0001      ADLs   ADL Comments Pt/spouse given alternative options for hooking bra and tying shoes and provided handout on potential A/E.      Splinting   Splinting Fabricated and fitted new resting hand splint and issued. Reviewed splint wear and care with pt/husband               OT Education - 04/26/16 1130    Education provided Yes    Education Details splint wear and care, task modifications and potential A/E needs   Person(s) Educated Patient;Spouse   Methods Explanation;Demonstration;Handout   Comprehension Verbalized understanding             OT Long Term Goals - 04/26/16 1150      OT LONG TERM GOAL #1   Title Independent w/ splint wear and care   Time 4   Period Weeks   Status On-going     OT LONG TERM GOAL #2   Title pt/husband to verbalize understanding with alternatives and potential A/E to hook bra and tie shoes   Time 4   Period Weeks   Status On-going               Plan - 04/26/16 1146    Clinical Impression Statement Pt is a 66 y.o. female who presents to outpatient rehab for O.T. evaluation and new resting hand splint. Pt had CVA in 2009 with residual dense spastic hemiplegia Rt dominant UE.   Rehab Potential Good   OT Frequency 1x / week   OT Duration 4 weeks  Anticipate only 2 visits needed   OT Treatment/Interventions Self-care/ADL training;Splinting;Patient/family education;Passive range of motion   Plan splint assessment and adjustments prn, practice use of A/E if pt/spouse wish, d/c if no further issues. Pt's husband said he will call to cancel if no further issues with splint and wants to d/c   Consulted and Agree with Plan of Care Patient;Family member/caregiver   Family Member Consulted spouse      Patient will benefit from skilled therapeutic intervention in order to improve the following deficits and impairments:  Decreased range of motion, Impaired UE functional use, Pain, Impaired tone, Impaired sensation  Visit Diagnosis: Stiffness of joint, hand, right - Plan: Ot plan of care cert/re-cert  Hemiplegia and hemiparesis following cerebral infarction affecting right dominant side ([place]) - Plan: Ot plan of care cert/re-cert      G-Codes - 41/66/06 1151    Functional Assessment Tool Used (Outpatient only) splinting needs - able to don/doff with min assist    Functional Limitation Other OT primary   Other OT Primary Current Status (T0160) At least 20 percent but less than 40 percent impaired, limited or restricted   Other OT Primary Goal Status (  G8991) At least 1 percent but less than 20 percent impaired, limited or restricted      Problem List Patient Active Problem List   Diagnosis Date Noted  . Osteoporosis 12/03/2014  . Vitamin D deficiency 12/19/2012  . Dysarthria due to cerebrovascular accident ([place]) 12/12/2012  . Gait instability 12/12/2012  . Contracture of joint 12/12/2012  . Hemiplegia of dominant side as late effect following cerebrovascular disease ([place]) 12/12/2012  . Anticoagulated on Coumadin 09/19/2011  . Cardiomyopathy in other diseases classified elsewhere 08/24/2011  . (L) MCA cardio embolic stroke  39/79/5369  . Dyslipidemia 06/25/2011  . H/O tobacco use 06/25/2011  . Weakness 05/28/2011  . History of CVA (cerebrovascular accident) 05/28/2011  . HTN (hypertension) 05/28/2011  . HLD (hyperlipidemia) 05/28/2011  . Seizure disorder ([place]) 05/28/2011    Carey Bullocks, OTR/L 04/26/2016, 11:55 AM  [facility] [address] [place], [place], [address] Phone: [phone]   Fax:  [phone]  Name: [name] MRN: [id number] Date of Birth: 07/27/1950

## 2016-04-26 NOTE — Patient Instructions (Signed)
Your Splint This splint should initially be fitted by a healthcare practitioner.  The healthcare practitioner is responsible for providing wearing instructions and precautions to the patient, other healthcare practitioners and care provider involved in the patient's care.  This splint was custom made for you. Please read the following instructions to learn about wearing and caring for your splint.  Precautions Should your splint cause any of the following problems, remove the splint immediately and contact your therapist/physician.  Swelling  Severe Pain  Pressure Areas  Stiffness  Numbness  Do not wear your splint while operating machinery unless it has been fabricated for that purpose.  When To Wear Your Splint Where your splint according to your therapist/physician instructions. Daytime for up to 10 hours  Care and Cleaning of Your Splint 1. Keep your splint away from open flames. 2. Your splint will lose its shape in temperatures over 135 degrees Farenheit, ( in car windows, near radiators, ovens or in hot water).  Never make any adjustments to your splint, if the splint needs adjusting remove it and make an appointment to see your therapist. 3. Your splint may be cleaned with soap and lukewarm water, or rubbing alcohol.  Do not immerse in hot water over 135 degrees Farenheit. 4. For ink or hard to remove spots use a scouring cleanser which contains chlorine.  Rinse the splint thoroughly after using chlorine cleanser.

## 2016-04-30 ENCOUNTER — Ambulatory Visit: Payer: Medicare HMO | Admitting: Occupational Therapy

## 2016-04-30 DIAGNOSIS — M25641 Stiffness of right hand, not elsewhere classified: Secondary | ICD-10-CM

## 2016-04-30 DIAGNOSIS — R252 Cramp and spasm: Secondary | ICD-10-CM | POA: Diagnosis not present

## 2016-04-30 DIAGNOSIS — I69351 Hemiplegia and hemiparesis following cerebral infarction affecting right dominant side: Secondary | ICD-10-CM

## 2016-04-30 NOTE — Therapy (Signed)
[facility] [address] [place], [place], [address] Phone: [phone]   Fax:  [phone]  Occupational Therapy Treatment  Patient Details  Name: [name] MRN: [id number] Date of Birth: November 22, 1950 Referring Provider: Dr. Delman Cheadle  Encounter Date: 04/30/2016      OT End of Session - 04/30/16 1304    Visit Number 2   Number of Visits 4   Date for OT Re-Evaluation 05/26/16   Authorization Type [employer] MCR   Authorization - Visit Number 2   Authorization - Number of Visits 10   OT Start Time 1100   OT Stop Time 1145   OT Time Calculation (min) 45 min   Activity Tolerance Patient tolerated treatment well      Past Medical History:  Diagnosis Date  . Adenomatous polyp of colon 09/2012   repeat colonoscopy in 5 years by Dr. Sharlett Iles  . CHF (congestive heart failure) (HCC)    EF 15-20% as of 05/28/11 (Dr [facility] D/C summary)  . CVA (cerebral infarction)   . Hemiparesis ([place])   . Hyperlipidemia   . Hypertension   . Seizures ([place])   . Sickle cell anemia (HCC)   . Stroke ([place])   . Vitamin D deficiency 2010    Past Surgical History:  Procedure Laterality Date  . ABDOMINAL HYSTERECTOMY    . FRACTURE SURGERY    . HIP SURGERY    . TUBAL LIGATION      There were no vitals filed for this visit.      Subjective Assessment - 04/30/16 1105    Subjective  Husband reports splint going well with no complaints or signs of discomfort   Patient is accompained by: Family member   Pertinent History CVA 2009   Patient Stated Goals Need new splint   Currently in Pain? No/denies                      OT Treatments/Exercises (OP) - 04/30/16 0001      ADLs   UB Dressing Pt/husband shown how to properly use bra angel for hooking bra one handed since other alternatives did not work   LB Dressing Practiced donning/doffing shoe and use of shoe button on Rt hemiparetic side. Pt/family  verbalized understanding with use of shoe button. Pt only required assist with getting shoe all the way on with AFO, but I'ly doffed shoe, AFO, re-donned AFO and used shoe button correctly   ADL Comments Reviewed how to acquire A/E recommendations. Also referred to P.T. to direct pt/husband correctly for adjustments to AFO prn     Splinting   Splinting Assessed splint with no signs of pressure areas/sores, however did flare splint out away from skin radial side of forearm d/t potential risk.                      OT Long Term Goals - 04/30/16 1304      OT LONG TERM GOAL #1   Title Independent w/ splint wear and care   Time 4   Period Weeks   Status Achieved     OT LONG TERM GOAL #2   Title pt/husband to verbalize understanding with alternatives and potential A/E to hook bra and tie shoes   Time 4   Period Weeks   Status Achieved               Plan - 04/30/16 1305    Clinical Impression Statement  Pt met both LTG's and pt/husband agrees to d/c due to meeting goals   OT Treatment/Interventions Self-care/ADL training;Splinting;Patient/family education;Passive range of motion   Plan D/C O.Donnajean Lopes and Agree with Plan of Care Patient;Family member/caregiver   Family Member Consulted spouse      Patient will benefit from skilled therapeutic intervention in order to improve the following deficits and impairments:  Decreased range of motion, Impaired UE functional use, Pain, Impaired tone, Impaired sensation  Visit Diagnosis: Stiffness of joint, hand, right  Hemiplegia and hemiparesis following cerebral infarction affecting right dominant side (HCC)  Spasticity      G-Codes - May 01, 2016 1307    Functional Assessment Tool Used (Outpatient only) splinting needs - able to don/doff Independently   Functional Limitation Other OT primary   Other OT Primary Goal Status (W1027) At least 1 percent but less than 20 percent impaired, limited or restricted   Other  OT Primary Discharge Status (O5366) At least 1 percent but less than 20 percent impaired, limited or restricted      Problem List Patient Active Problem List   Diagnosis Date Noted  . Osteoporosis 12/03/2014  . Vitamin D deficiency 12/19/2012  . Dysarthria due to cerebrovascular accident ([place]) 12/12/2012  . Gait instability 12/12/2012  . Contracture of joint 12/12/2012  . Hemiplegia of dominant side as late effect following cerebrovascular disease ([place]) 12/12/2012  . Anticoagulated on Coumadin 09/19/2011  . Cardiomyopathy in other diseases classified elsewhere 08/24/2011  . (L) MCA cardio embolic stroke  44/03/4740  . Dyslipidemia 06/25/2011  . H/O tobacco use 06/25/2011  . Weakness 05/28/2011  . History of CVA (cerebrovascular accident) 05/28/2011  . HTN (hypertension) 05/28/2011  . HLD (hyperlipidemia) 05/28/2011  . Seizure disorder ([place]) 05/28/2011   OCCUPATIONAL THERAPY DISCHARGE SUMMARY  Visits from Start of Care: 2  Current functional level related to goals / functional outcomes: See above   Remaining deficits: Same as initial evaluation   Education / Equipment: Splint wear and care, A/E recommendations  Plan: Patient agrees to discharge.  Patient goals were met. Patient is being discharged due to meeting the stated rehab goals.  ?????        Carey Bullocks, OTR/L 05/01/2016, 1:08 PM  [facility] [address] [place], [place], [address] Phone: [phone]   Fax:  [phone]  Name: [name] MRN: [id number] Date of Birth: Apr 11, 1950

## 2016-05-02 ENCOUNTER — Encounter: Payer: Self-pay | Admitting: Podiatry

## 2016-05-02 ENCOUNTER — Ambulatory Visit (INDEPENDENT_AMBULATORY_CARE_PROVIDER_SITE_OTHER): Payer: Medicare HMO | Admitting: Podiatry

## 2016-05-02 ENCOUNTER — Ambulatory Visit (INDEPENDENT_AMBULATORY_CARE_PROVIDER_SITE_OTHER): Payer: Medicare HMO

## 2016-05-02 VITALS — Ht 66.0 in | Wt 128.0 lb

## 2016-05-02 DIAGNOSIS — L03031 Cellulitis of right toe: Secondary | ICD-10-CM | POA: Diagnosis not present

## 2016-05-02 DIAGNOSIS — B351 Tinea unguium: Secondary | ICD-10-CM | POA: Diagnosis not present

## 2016-05-02 DIAGNOSIS — M79671 Pain in right foot: Secondary | ICD-10-CM

## 2016-05-02 DIAGNOSIS — M79604 Pain in right leg: Secondary | ICD-10-CM | POA: Diagnosis not present

## 2016-05-02 DIAGNOSIS — M21371 Foot drop, right foot: Secondary | ICD-10-CM | POA: Diagnosis not present

## 2016-05-02 DIAGNOSIS — M79605 Pain in left leg: Secondary | ICD-10-CM | POA: Diagnosis not present

## 2016-05-02 NOTE — Progress Notes (Signed)
   Subjective:    Patient ID: [name], female    DOB: 16-Jan-1951, 66 y.o.   MRN: [id number]  HPI  Chief Complaint  Patient presents with  . Nail Problem    Right foot; great toe-medial side; pt stated, "Has hurt for the past 6 months; had a stroke in 2009"  . Ankle Pain    Right foot-medial side; pt stated, "Dr. Brigitte Pulse thinks pain is coming from the brace they are wearing"     Review of Systems  All other systems reviewed and are negative.      Objective:   Physical Exam        Assessment & Plan:

## 2016-05-02 NOTE — Progress Notes (Signed)
Subjective:     Patient ID: [name], female   DOB: 04-20-1950, 66 y.o.   MRN: [id number]  HPI patient presents with caregiver with severe foot drop deformity right secondary to stroke who has been a day AFO type brace which is causing a lot of irritation and is now approximately 66 years old. Patient tries to stand [name] but it's increasingly difficult and is also noted to have nail disease 1-5 of both feet that they cannot manage and becomes painful   Review of Systems  All other systems reviewed and are negative.      Objective:   Physical Exam  Cardiovascular: Intact distal pulses.   Skin: Skin is warm and dry.  Nursing note and vitals reviewed.  Vascular status was found to be mildly diminished both PT and DP pulses and patient has diminished sharp Dole vibratory bilateral. Range of motion of the subtalar midtarsal joint was diminished and there was noted to be again flatfoot deformity and foot drop right with inability to bring the foot to 90. The patient has a brace which is not articulating and not fitted properly for this patient and the nails were all thickened 1-5 both feet and dystrophic     Assessment:     Severe foot drop deformity right with history of stroke and thick yellow brittle nailbeds 1-5 both feet    Plan:     H&P conditions reviewed and at this point I have the patient see the pad orthotist who agrees that an articulating AFO will be of great benefit to her. Patient will have this done and today I debrided nailbeds 1-5 both feet to reduce the pressure against the nails

## 2016-05-02 NOTE — Patient Instructions (Signed)

## 2016-05-03 ENCOUNTER — Other Ambulatory Visit: Payer: Self-pay | Admitting: Family Medicine

## 2016-05-03 DIAGNOSIS — Z1231 Encounter for screening mammogram for malignant neoplasm of breast: Secondary | ICD-10-CM

## 2016-05-29 ENCOUNTER — Ambulatory Visit
Admission: RE | Admit: 2016-05-29 | Discharge: 2016-05-29 | Disposition: A | Discharge status: Home / Self Care | Payer: Medicare HMO | Source: Ambulatory Visit | Attending: Family Medicine | Admitting: Family Medicine

## 2016-05-29 DIAGNOSIS — Z1231 Encounter for screening mammogram for malignant neoplasm of breast: Secondary | ICD-10-CM

## 2016-06-05 DIAGNOSIS — Z7901 Long term (current) use of anticoagulants: Secondary | ICD-10-CM | POA: Diagnosis not present

## 2016-06-05 DIAGNOSIS — I639 Cerebral infarction, unspecified: Secondary | ICD-10-CM | POA: Diagnosis not present

## 2016-07-14 ENCOUNTER — Other Ambulatory Visit: Payer: Self-pay | Admitting: Family Medicine

## 2016-07-24 DIAGNOSIS — Z7901 Long term (current) use of anticoagulants: Secondary | ICD-10-CM | POA: Diagnosis not present

## 2016-07-24 DIAGNOSIS — I639 Cerebral infarction, unspecified: Secondary | ICD-10-CM | POA: Diagnosis not present

## 2016-09-05 DIAGNOSIS — Z7901 Long term (current) use of anticoagulants: Secondary | ICD-10-CM | POA: Diagnosis not present

## 2016-09-05 DIAGNOSIS — I639 Cerebral infarction, unspecified: Secondary | ICD-10-CM | POA: Diagnosis not present

## 2016-10-15 ENCOUNTER — Telehealth: Payer: Self-pay | Admitting: Family Medicine

## 2016-10-15 NOTE — Telephone Encounter (Signed)
Pt is needing a refill on digxin and warfarin  Best number [phone]

## 2016-10-16 MED ORDER — DIGOXIN 125 MCG PO TABS: 125.0000 ug | ORAL_TABLET | Freq: Every day | ORAL | 0 refills | Status: DC

## 2016-10-16 MED ORDER — WARFARIN SODIUM 5 MG PO TABS: ORAL_TABLET | ORAL | 0 refills | Status: DC

## 2016-10-16 NOTE — Telephone Encounter (Signed)
Please advise 

## 2016-10-16 NOTE — Telephone Encounter (Signed)
Sent in 3 mo supply to [facility] but would like to see pt in for f/u labs asap - her last digoxin level was way to high - in the toxic range - which we assumed was an error due to timing of the draw but definitely need it rechecked before we continue her on the same dose. Need to come in for office visit and blood draw either first thing in the morning BEFORE she has taken the digoxin or in the late afternoon 6 HOURS AFTER her daily digoxin dose.

## 2017-01-03 ENCOUNTER — Telehealth: Payer: Self-pay | Admitting: Family Medicine

## 2017-01-03 NOTE — Telephone Encounter (Signed)
Anticoagulant and Digoxin needing refill.   Last OV 6 mo. Ago.

## 2017-01-03 NOTE — Telephone Encounter (Signed)
Copied from [facility]. Topic: Quick Communication - Rx Refill/Question >> Jan 03, 2017  9:27 AM Robina Ade, Helene Kelp D wrote: Has the patient contacted their pharmacy? Yes (Agent: If no, request that the patient contact the pharmacy for the refill.) Preferred Pharmacy (with phone number or street name): [facility], [place]: Please be advised that RX refills may take up to 3 business days. We ask that you follow-up with your pharmacy. [facility] called and request med refill for patient on her  warfarin (COUMADIN) 5 MG tablet  and digoxin (LANOXIN) 0.125 MG tablet.

## 2017-01-03 NOTE — Telephone Encounter (Signed)
LMOVM at home Spoke to husband on cell - they are out of town. Appt made for Sat 12/22 @8am  for pt to see Dr. Brigitte Pulse and get labs for elevated dig level Advised husband - do not let pt take her digoxin that morning.  He verbalized understanding.

## 2017-01-10 ENCOUNTER — Ambulatory Visit: Payer: Medicare HMO | Admitting: Family Medicine

## 2017-01-11 NOTE — Progress Notes (Deleted)
Subjective:    Patient ID: [name], female    DOB: 16-Jul-1950, 66 y.o.   MRN: [id number]  HPI  66 yo woman who suffered a severe stroke in 12/2007 resulting in right hemiplegia and severe dysarthria. Her husband Alfonzo accompanies her to all her visit as has to help speak for her and interpret her statements which are very guttural expressions.  Anticoag due to h/o CVA: On warfarin.INR followed at [facility]. No sig problems.  No h/o cardiac arrhythmias. Rt Hemplegia s/p CVA: Uses Rt foot splint for drop foot - saw podiatry Dr. Paulla Dolly 04/2016. Uses Rt wrist splint to prevent contractures - remade by OT 04/2016. S/o do: stable on Keppra 500 bid- plan to continue indefinitely.  HTN, HPL with H/o CHF : Stable on lisinopril 10, carvedilol 6.25 bid, lipitor 80, and digoxin 0.25mg  qd.  Cardiologist is Dr. Einar Gip who thought initial reduced EF of15-20% was likely secondary to stress during CVA(see scanned note in 03/2012) and pt likely did NOT have CHF - repeated echo which showed grade 1 diastolic, EF was 62% so no further f/u needed unless pt developed changes, signs of fluid overload, etc. Her last digoxin level03/2018 was drawn just several hr after taking her meds on an empty stomach (so NOT at the nadir of ideally 12 hrs after dose) and was critically to high so had pt reduce to 1/2 tab/d - SO NOW ON DIGOXIN 0.125mg ??. Lipids at goal 03/2016.   Osteoporosis: DEXA scan done 12/02/14 showed T score -2.5 in left femur so patient was started on fosamax and advised on calcium/vitamin D supp. vit D LOW at 23.8 9 mos ago.  Past Medical History:  Diagnosis Date  . Adenomatous polyp of colon 09/2012   repeat colonoscopy in 5 years by Dr. Sharlett Iles  . CHF (congestive heart failure) (HCC)    EF 15-20% as of 05/28/11 (Dr [facility] D/C summary)  . CVA (cerebral infarction)   . Hemiparesis ([place])   . Hyperlipidemia   . Hypertension   . Seizures ([place])     . Sickle cell anemia (HCC)   . Stroke ([place])   . Vitamin D deficiency 2010   Past Surgical History:  Procedure Laterality Date  . ABDOMINAL HYSTERECTOMY    . FRACTURE SURGERY    . HIP SURGERY    . TUBAL LIGATION     Current Outpatient Medications on File Prior to Visit  Medication Sig Dispense Refill  . alendronate (FOSAMAX) 70 MG tablet TAKE 1 TABLET EVERY 7 DAYS WITH FULL GLASS OF WATER ON AN EMPTY STOMACH 12 tablet 3  . atorvastatin (LIPITOR) 80 MG tablet Take 1 tablet (80 mg total) by mouth at bedtime. 90 tablet 3  . baclofen (LIORESAL) 20 MG tablet TAKE 1 TABLET THREE TIMES DAILY (MAY TAKE AN ADDITIONAL TABLET MID-DAY OR BEFORE BED AS NEEDED FOR MUSCLE SPASMS) 360 tablet 1  . carvedilol (COREG) 6.25 MG tablet Take 1 tablet (6.25 mg total) by mouth 2 (two) times daily. 180 tablet 3  . digoxin (LANOXIN) 0.125 MG tablet Take 1 tablet (125 mcg total) by mouth daily. 90 tablet 0  . docusate sodium (COLACE) 50 MG capsule Take 1 capsule (50 mg total) by mouth daily. 90 capsule 3  . hydrocortisone (ANUSOL-HC) 25 MG suppository Place 1 suppository (25 mg total) rectally 2 (two) times daily as needed for hemorrhoids or itching. 12 suppository 2  . levETIRAcetam (KEPPRA) 500 MG tablet Take 1 tablet (500 mg total)  by mouth 2 (two) times daily. 180 tablet 3  . Liniments (HEET TRIPLE ACTION EX) Apply 1 application topically as needed (for pain).    Marland Kitchen lisinopril (PRINIVIL,ZESTRIL) 10 MG tablet Take 1 tablet (10 mg total) by mouth daily. 90 tablet 3  . nystatin (MYCOSTATIN/NYSTOP) powder Apply topically 4 (four) times daily. 60 g 3  . polyethylene glycol (MIRALAX / GLYCOLAX) packet Take 17 g by mouth daily as needed for moderate constipation.    . triamcinolone cream (KENALOG) 0.1 % Apply 1 application topically 2 (two) times daily. 80 g 0  . warfarin (COUMADIN) 5 MG tablet TAKE AS DIRECTED BY COUMADIN CLINIC 90 tablet 0  . zoster vaccine live, PF, (ZOSTAVAX) 75643 UNT/0.65ML injection Inject 19,400  Units into the skin once. 1 vial 0   No current facility-administered medications on file prior to visit.    No Known Allergies Family History  Problem Relation Age of Onset  . Diabetes Daughter   . Colon cancer Maternal Grandfather    Social History   Socioeconomic History  . Marital status: Married    Spouse name: Not on file  . Number of children: 2  . Years of education: 25  . Highest education level: Not on file  Social Needs  . Financial resource strain: Not on file  . Food insecurity - worry: Not on file  . Food insecurity - inability: Not on file  . Transportation needs - medical: Not on file  . Transportation needs - non-medical: Not on file  Occupational History    Employer: RETIRED  Tobacco Use  . Smoking status: Former Smoker    Types: Cigarettes    Last attempt to quit: 09/26/2007    Years since quitting: 9.3  . Smokeless tobacco: Never Used  Substance and Sexual Activity  . Alcohol use: No  . Drug use: No  . Sexual activity: No  Other Topics Concern  . Not on file  Social History Narrative  . Not on file    Review of Systems     Objective:   Physical Exam    There were no vitals taken for this visit.     Assessment & Plan:  F/U for AWV with NHA followed by CPE w/ me in 3-6 mos. nml digoxin is at nadir after dose - ideally drawn 12 hrs after dose. Did she take it this a.m.?  Still taking ? Tab dig.  Pt on dig for h/o CHF so therapeutic drug level 0.5-0.9 ng/ml (rather than a. Fib is 0.8-2) at last >6 hrs after last dose DEXA needed since on fosamax - 2 yrs since last - can get in May with next mammogram. Supplementing with D? How much?  Recheck?? Weighing self at home?  Consider weaning down/off digoxin and watch for signs of fluid overload.  Baseline pro-BNP <30 (though was to high to read when pt presented w/ her CVA initially).  CXR 10/2013 nml - no effusions, last EKG 07/2013 - consider repeating EKG today. At CVA 12/2007 had an elevated  homocysteine - B12 def????  NOT

## 2017-01-12 ENCOUNTER — Ambulatory Visit (INDEPENDENT_AMBULATORY_CARE_PROVIDER_SITE_OTHER): Payer: Medicare HMO

## 2017-01-12 ENCOUNTER — Encounter: Payer: Self-pay | Admitting: Family Medicine

## 2017-01-12 ENCOUNTER — Other Ambulatory Visit: Payer: Self-pay

## 2017-01-12 ENCOUNTER — Ambulatory Visit: Payer: Medicare HMO | Admitting: Family Medicine

## 2017-01-12 VITALS — BP 144/66 | HR 90 | Temp 98.6°F | Resp 16 | Ht 66.0 in | Wt 132.8 lb

## 2017-01-12 DIAGNOSIS — R059 Cough, unspecified: Secondary | ICD-10-CM

## 2017-01-12 DIAGNOSIS — G40909 Epilepsy, unspecified, not intractable, without status epilepticus: Secondary | ICD-10-CM

## 2017-01-12 DIAGNOSIS — I10 Essential (primary) hypertension: Secondary | ICD-10-CM | POA: Diagnosis not present

## 2017-01-12 DIAGNOSIS — Z5181 Encounter for therapeutic drug level monitoring: Secondary | ICD-10-CM | POA: Diagnosis not present

## 2017-01-12 DIAGNOSIS — I429 Cardiomyopathy, unspecified: Secondary | ICD-10-CM

## 2017-01-12 DIAGNOSIS — Z1231 Encounter for screening mammogram for malignant neoplasm of breast: Secondary | ICD-10-CM | POA: Diagnosis not present

## 2017-01-12 DIAGNOSIS — E559 Vitamin D deficiency, unspecified: Secondary | ICD-10-CM

## 2017-01-12 DIAGNOSIS — Z119 Encounter for screening for infectious and parasitic diseases, unspecified: Secondary | ICD-10-CM

## 2017-01-12 DIAGNOSIS — M816 Localized osteoporosis [Lequesne]: Secondary | ICD-10-CM

## 2017-01-12 DIAGNOSIS — R05 Cough: Secondary | ICD-10-CM

## 2017-01-12 DIAGNOSIS — I69959 Hemiplegia and hemiparesis following unspecified cerebrovascular disease affecting unspecified side: Secondary | ICD-10-CM

## 2017-01-12 DIAGNOSIS — Z1239 Encounter for other screening for malignant neoplasm of breast: Secondary | ICD-10-CM

## 2017-01-12 DIAGNOSIS — E782 Mixed hyperlipidemia: Secondary | ICD-10-CM

## 2017-01-12 DIAGNOSIS — M21371 Foot drop, right foot: Secondary | ICD-10-CM

## 2017-01-12 DIAGNOSIS — Z7901 Long term (current) use of anticoagulants: Secondary | ICD-10-CM

## 2017-01-12 DIAGNOSIS — I69322 Dysarthria following cerebral infarction: Secondary | ICD-10-CM | POA: Diagnosis not present

## 2017-01-12 DIAGNOSIS — E2839 Other primary ovarian failure: Secondary | ICD-10-CM

## 2017-01-12 LAB — POCT CBC
GRANULOCYTE PERCENT: 64.5 % (ref 37–80)
HCT, POC: 39.1 % (ref 37.7–47.9)
Hemoglobin: 13.1 g/dL (ref 12.2–16.2)
Lymph, poc: 2.9 (ref 0.6–3.4)
MCH, POC: 28.2 pg (ref 27–31.2)
MCHC: 33.4 g/dL (ref 31.8–35.4)
MCV: 84.3 fL (ref 80–97)
MID (CBC): 0.3 (ref 0–0.9)
MPV: 7.8 fL (ref 0–99.8)
PLATELET COUNT, POC: 326 10*3/uL (ref 142–424)
POC Granulocyte: 5.7 (ref 2–6.9)
POC LYMPH %: 32.6 % (ref 10–50)
POC MID %: 2.9 %M (ref 0–12)
RBC: 4.64 M/uL (ref 4.04–5.48)
RDW, POC: 14 %
WBC: 8.8 10*3/uL (ref 4.6–10.2)

## 2017-01-12 MED ORDER — WARFARIN SODIUM 5 MG PO TABS: ORAL_TABLET | ORAL | 1 refills | Status: DC

## 2017-01-12 MED ORDER — MUCINEX DM MAXIMUM STRENGTH 60-1200 MG PO TB12: 1.0000 | ORAL_TABLET | Freq: Two times a day (BID) | ORAL | 1 refills | Status: DC

## 2017-01-12 MED ORDER — BENZONATATE 200 MG PO CAPS: 200.0000 mg | ORAL_CAPSULE | Freq: Three times a day (TID) | ORAL | 0 refills | Status: DC | PRN

## 2017-01-12 MED ORDER — VITAMIN D (ERGOCALCIFEROL) 1.25 MG (50000 UNIT) PO CAPS: 50000.0000 [IU] | ORAL_CAPSULE | ORAL | 1 refills | Status: DC

## 2017-01-12 MED ORDER — AZITHROMYCIN 250 MG PO TABS
ORAL_TABLET | ORAL | 0 refills | Status: DC
Start: 2017-01-12 — End: 2017-04-12

## 2017-01-12 NOTE — Patient Instructions (Addendum)
You need to schedule your mammogram and your DEXA bone scan for around MAY 05/2017 - have this done about a month before your CPE with me (in 6 mos).  Please call [facility] at [phone] to schedule.  Please make an appointment at the scheduling desk with Dewitt Hoes, our Nurse Health and Wellness Advisor, for your Wellness Visit done in 3-6 mos at [address] appointment clinic, which is a FREE annual benefit with your insurance. Then, you can then schedule to have your yearly complete physical exam with me at our next visit in 6 months.   Your cough will last for 3 weeks total - so probably through the first week in January so use cough suppressents. If you start feeling worse at any point, then you can fill the zpack (azithromycin antibiotic).  Some homemade cough syrups can be just as effective as the over-the-counter medications with much fewer side effects. Also they can be combined with over-the-counter and prescription cough regimens, to make them even more effective together. Below is a recipe for my favorite:  Sweet lemon, honey, and thyme cough syrup recipe Place half of a chopped lemon in a container and cover with half a cup of honey (raw is best but any will do).  Then boil a handful of fresh thyme sprigs or organic dry leaves with 2 cups of water uncovered until it is reduced to one cup. Strain this liquid into the jar with the lemon and honey to remove the steams and leaves and then shake it up. Use 1 teaspoonful as often as needed and it will store in the refrigerator for at least a month.     IF you received an x-ray today, you will receive an invoice from [facility]. Please contact [facility] at [phone] with questions or concerns regarding your invoice.   IF you received labwork today, you will receive an invoice from [facility]. Please contact [facility] at [phone] with questions or concerns regarding your invoice.    Our billing staff will not be able to assist you with questions regarding bills from these companies.  You will be contacted with the lab results as soon as they are available. The fastest way to get your results is to activate your My Chart account. Instructions are located on the last page of this paperwork. If you have not heard from [place] regarding the results in 2 weeks, please contact this office.      Acute Bronchitis, Adult Acute bronchitis is sudden (acute) swelling of the air tubes (bronchi) in the lungs. Acute bronchitis causes these tubes to fill with mucus, which can make it hard to breathe. It can also cause coughing or wheezing. In adults, acute bronchitis usually goes away within 2 weeks. A cough caused by bronchitis may last up to 3 weeks. Smoking, allergies, and asthma can make the condition worse. Repeated episodes of bronchitis may cause further lung problems, such as chronic obstructive pulmonary disease (COPD). What are the causes? This condition can be caused by germs and by substances that irritate the lungs, including:  Cold and flu viruses. This condition is most often caused by the same virus that causes a cold.  Bacteria.  Exposure to tobacco smoke, dust, fumes, and air pollution.  What increases the risk? This condition is more likely to develop in people who:  Have close contact with someone with acute bronchitis.  Are exposed to lung irritants, such as tobacco smoke, dust, fumes, and vapors.  Have  a weak immune system.  Have a respiratory condition such as asthma.  What are the signs or symptoms? Symptoms of this condition include:  A cough.  Coughing up clear, yellow, or green mucus.  Wheezing.  Chest congestion.  Shortness of breath.  A fever.  Body aches.  Chills.  A sore throat.  How is this diagnosed? This condition is usually diagnosed with a physical exam. During the exam, your health care provider may order tests, such as  chest X-rays, to rule out other conditions. He or she may also:  Test a sample of your mucus for bacterial infection.  Check the level of oxygen in your blood. This is done to check for pneumonia.  Do a chest X-ray or lung function testing to rule out pneumonia and other conditions.  Perform blood tests.  Your health care provider will also ask about your symptoms and medical history. How is this treated? Most cases of acute bronchitis clear up over time without treatment. Your health care provider may recommend:  Drinking more fluids. Drinking more makes your mucus thinner, which may make it easier to breathe.  Taking a medicine for a fever or cough.  Taking an antibiotic medicine.  Using an inhaler to help improve shortness of breath and to control a cough.  Using a cool mist vaporizer or humidifier to make it easier to breathe.  Follow these instructions at home: Medicines  Take over-the-counter and prescription medicines only as told by your health care provider.  If you were prescribed an antibiotic, take it as told by your health care provider. Do not stop taking the antibiotic even if you start to feel better. General instructions  Get plenty of rest.  Drink enough fluids to keep your urine clear or pale yellow.  Avoid smoking and secondhand smoke. Exposure to cigarette smoke or irritating chemicals will make bronchitis worse. If you smoke and you need help quitting, ask your health care provider. Quitting smoking will help your lungs heal faster.  Use an inhaler, cool mist vaporizer, or humidifier as told by your health care provider.  Keep all follow-up visits as told by your health care provider. This is important. How is this prevented? To lower your risk of getting this condition again:  Wash your hands often with soap and water. If soap and water are not available, use hand sanitizer.  Avoid contact with people who have cold symptoms.  Try not to touch  your hands to your mouth, nose, or eyes.  Make sure to get the flu shot every year.  Contact a health care provider if:  Your symptoms do not improve in 2 weeks of treatment. Get help right away if:  You cough up blood.  You have chest pain.  You have severe shortness of breath.  You become dehydrated.  You faint or keep feeling like you are going to faint.  You keep vomiting.  You have a severe headache.  Your fever or chills gets worse. This information is not intended to replace advice given to you by your health care provider. Make sure you discuss any questions you have with your health care provider. Document Released: 02/16/2004 Document Revised: 08/03/2015 Document Reviewed: 06/29/2015 Elsevier Interactive Patient Education  [employer].

## 2017-01-12 NOTE — Progress Notes (Signed)
Subjective:  By signing my name below, I, Moises Blood, attest that this documentation has been prepared under the direction and in the presence of Delman Cheadle, MD. Electronically Signed: Moises Blood, [place]. 01/12/2017 , 8:41 AM .  Patient was seen in Room 1 .   Patient ID: [name], female    DOB: 1950/12/27, 66 y.o.   MRN: [id number] Chief Complaint  Patient presents with  . Follow-up    labwork, dry cough x 5-6 days    HPI  66 yo woman who suffered a severe stroke in 12/2007 resulting in right hemiplegia and severe dysarthria. Her husband Alfonzo accompanies her to all her visit as has to help speak for her and interpret her statements which are very guttural expressions.   Dry cough Patient complains having dry cough that started 5-6 days ago. She states she coughs all day long, feeling congested. She would rarely wake up with dry cough, and some regurgitation with coughing really hard. She denies appetite loss, or hard to take deep breath.   Anticoag due to h/o CVA: On warfarin.INR followed at [facility]. No sig problems.  No h/o cardiac arrhythmias. Coumadin levels doing well.  Rt Hemplegia s/p CVA: Uses Rt foot splint for drop foot - saw podiatry Dr. Paulla Dolly 04/2016. Uses Rt wrist splint to prevent contractures - remade by OT 04/2016. She hasn't received her right foot splint, because podiatry office was closed.  S/o do: stable on Keppra 500 bid- plan to continue indefinitely.  HTN, HPL with H/o CHF : Stable on lisinopril 10, carvedilol 6.25 bid, lipitor 80, and digoxin 0.25mg  qd.  Cardiologist is Dr. Einar Gip who thought initial reduced EF of15-20% was likely secondary to stress during CVA(see scanned note in 03/2012) and pt likely did NOT have CHF - repeated echo which showed grade 1 diastolic, EF was 78% so no further f/u needed unless pt developed changes, signs of fluid overload, etc. Her last digoxin level 03/2016 was drawn just several hr  after taking her meds on an empty stomach (so NOT at the nadir of ideally 12 hrs after dose) and was critically to high so had pt reduce to 1/2 tab/d of the digoxin 0.25 - however, when I got a refill request for the digoxin 3 mos ago, I changed the rx to the 0.125mg  tab to have pt take a whole tab a day but Alfonzo reports that they have still been cutting the digoxin pill in half and he unsure if the mg changed - SO NOW ON DIGOXIN 0.0.625mg  x 3 mos ???. Lipids at goal 03/2016.  She's been taking her digoxin half tablet QD. Her husband ordered a new prescription of the digoxin but requires office visit before prescription can be filled.   Osteoporosis: DEXA scan done 12/02/14 showed T score -2.5 in left femur so patient was started on fosamax and advised on calcium/vitamin D supp. Vit D LOW at 23.8 9 mos ago.   Past Medical History:  Diagnosis Date  . Adenomatous polyp of colon 09/2012   repeat colonoscopy in 5 years by Dr. Sharlett Iles  . CHF (congestive heart failure) (HCC)    EF 15-20% as of 05/28/11 (Dr [facility] summary)  . CVA (cerebral infarction)   . Hemiparesis ([place])   . Hyperlipidemia   . Hypertension   . Seizures ([place])   . Sickle cell anemia (HCC)   . Stroke ([place])   . Vitamin D deficiency 2010   Past Surgical History:  Procedure Laterality Date  . ABDOMINAL HYSTERECTOMY    . FRACTURE SURGERY    . HIP SURGERY    . TUBAL LIGATION     Current Outpatient Medications on File Prior to Visit  Medication Sig Dispense Refill  . alendronate (FOSAMAX) 70 MG tablet TAKE 1 TABLET EVERY 7 DAYS WITH FULL GLASS OF WATER ON AN EMPTY STOMACH 12 tablet 3  . atorvastatin (LIPITOR) 80 MG tablet Take 1 tablet (80 mg total) by mouth at bedtime. 90 tablet 3  . baclofen (LIORESAL) 20 MG tablet TAKE 1 TABLET THREE TIMES DAILY (MAY TAKE AN ADDITIONAL TABLET MID-DAY OR BEFORE BED AS NEEDED FOR MUSCLE SPASMS) 360 tablet 1  . carvedilol (COREG) 6.25 MG tablet Take 1 tablet (6.25 mg total)  by mouth 2 (two) times daily. 180 tablet 3  . digoxin (LANOXIN) 0.125 MG tablet Take 1 tablet (125 mcg total) by mouth daily. 90 tablet 0  . docusate sodium (COLACE) 50 MG capsule Take 1 capsule (50 mg total) by mouth daily. 90 capsule 3  . hydrocortisone (ANUSOL-HC) 25 MG suppository Place 1 suppository (25 mg total) rectally 2 (two) times daily as needed for hemorrhoids or itching. 12 suppository 2  . levETIRAcetam (KEPPRA) 500 MG tablet Take 1 tablet (500 mg total) by mouth 2 (two) times daily. 180 tablet 3  . Liniments (HEET TRIPLE ACTION EX) Apply 1 application topically as needed (for pain).    Marland Kitchen lisinopril (PRINIVIL,ZESTRIL) 10 MG tablet Take 1 tablet (10 mg total) by mouth daily. 90 tablet 3  . nystatin (MYCOSTATIN/NYSTOP) powder Apply topically 4 (four) times daily. 60 g 3  . polyethylene glycol (MIRALAX / GLYCOLAX) packet Take 17 g by mouth daily as needed for moderate constipation.    . triamcinolone cream (KENALOG) 0.1 % Apply 1 application topically 2 (two) times daily. 80 g 0  . warfarin (COUMADIN) 5 MG tablet TAKE AS DIRECTED BY COUMADIN CLINIC 90 tablet 0  . zoster vaccine live, PF, (ZOSTAVAX) 98921 UNT/0.65ML injection Inject 19,400 Units into the skin once. 1 vial 0   No current facility-administered medications on file prior to visit.    No Known Allergies Family History  Problem Relation Age of Onset  . Diabetes Daughter   . Colon cancer Maternal Grandfather    Social History   Socioeconomic History  . Marital status: Married    Spouse name: None  . Number of children: 2  . Years of education: 72  . Highest education level: None  Social Needs  . Financial resource strain: None  . Food insecurity - worry: None  . Food insecurity - inability: None  . Transportation needs - medical: None  . Transportation needs - non-medical: None  Occupational History    Employer: RETIRED  Tobacco Use  . Smoking status: Former Smoker    Types: Cigarettes    Last attempt to  quit: 09/26/2007    Years since quitting: 9.3  . Smokeless tobacco: Never Used  Substance and Sexual Activity  . Alcohol use: No  . Drug use: No  . Sexual activity: No  Other Topics Concern  . None  Social History Narrative  . None   Depression screen [facility] 2/9 01/12/2017 04/16/2016 04/05/2016 09/30/2014 12/12/2012  Decreased Interest 0 0 0 0 0  Down, Depressed, Hopeless 0 0 0 0 1  PHQ - 2 Score 0 0 0 0 1     Review of Systems  Constitutional: Negative for appetite change, chills, fatigue, fever and unexpected weight change.  HENT: Positive for congestion and postnasal drip.   Respiratory: Positive for cough. Negative for shortness of breath and wheezing.   Gastrointestinal: Negative for constipation, diarrhea, nausea and vomiting.  Skin: Negative for rash and wound.  Neurological: Negative for dizziness, weakness and headaches.       Objective:   Physical Exam  Constitutional: She is oriented to person, place, and time. She appears well-developed and well-nourished. No distress.  HENT:  Head: Normocephalic and atraumatic.  Right Ear: Tympanic membrane normal.  Mouth/Throat: No posterior oropharyngeal erythema.  Nares with rhinitis Post oropharynx: postnasal drip present  Eyes: EOM are normal. Pupils are equal, round, and reactive to light.  Neck: Neck supple. No thyromegaly present.  Cardiovascular: Normal rate, regular rhythm, S1 normal, S2 normal and normal heart sounds.  Pulmonary/Chest: Effort normal. No respiratory distress.  Decreased air movement throughout, but lungs clear  Musculoskeletal: She exhibits no edema or tenderness.       Right shoulder: She exhibits decreased range of motion and decreased strength. She exhibits no tenderness, no bony tenderness, no swelling, no effusion and no pain.  Rt foot with trace pedal pulses but no focal tenderness nor abnml seen on exam. No rash, wounds, skin changes, edema, effusion, erythema.   Lymphadenopathy:    She has no  cervical adenopathy.  Neurological: She is alert and oriented to person, place, and time.  Skin: Skin is warm and dry.  Psychiatric: She has a normal mood and affect. Her behavior is normal.  Nursing note and vitals reviewed.   BP (!) 144/66   Pulse 90   Temp 98.6 F (37 C)   Resp 16   Ht 5\' 6"  (1.676 m)   Wt 132 lb 13.6 oz (60.3 kg)   SpO2 96%   BMI 21.44 kg/m   Dg Chest 2 View  Result Date: 01/12/2017 CLINICAL DATA:  cough x 5d, SHoB, CP high risk for aspiration with severe dysarthria s/p CVA 2009 EXAM: CHEST  2 VIEW COMPARISON:  10/23/2013 FINDINGS: Cardiac silhouette is normal in size. No mediastinal or hilar masses. No evidence of adenopathy. Clear lungs.  No pleural effusion or pneumothorax. Skeletal structures are intact. IMPRESSION: No active cardiopulmonary disease. Electronically Signed   By: Lajean Manes M.D.   On: 01/12/2017 09:12       Results for orders placed or performed in visit on 01/12/17  POCT CBC  Result Value Ref Range   WBC 8.8 4.6 - 10.2 K/uL   Lymph, poc 2.9 0.6 - 3.4   POC LYMPH PERCENT 32.6 10 - 50 %L   MID (cbc) 0.3 0 - 0.9   POC MID % 2.9 0 - 12 %M   POC Granulocyte 5.7 2 - 6.9   Granulocyte percent 64.5 37 - 80 %G   RBC 4.64 4.04 - 5.48 M/uL   Hemoglobin 13.1 12.2 - 16.2 g/dL   HCT, POC 39.1 37.7 - 47.9 %   MCV 84.3 80 - 97 fL   MCH, POC 28.2 27 - 31.2 pg   MCHC 33.4 31.8 - 35.4 g/dL   RDW, POC 14.0 %   Platelet Count, POC 326 142 - 424 K/uL   MPV 7.8 0 - 99.8 fL    Assessment & Plan:  F/U for AWV with NHA followed by CPE w/ me in 3-6 mos.  At CVA 12/2007 had an elevated homocysteine - B12 def????  Consider checking folate, B6, and B12 at next OV. refill digoxin after labs return  1. Essential hypertension -  well-controlled on lisinopril 10 and coreg 6.25 bid - cont  2. Secondary cardiomyopathy ([place]) -  Recheck baseline pro-BNP - last was <30 (though was to high to read when pt presented w/ her CVA initially).  CXR 10/2013 nml - no  effusions, last EKG 07/2013 - recommend updating EKG at next OV/CPE. Weigh self at home once/wk. If gaining 3-5 lbs or notes edema, DOE, SHoB, cough, orthopnea, RTC as may need to increase/resume digoxin.   I suspect she has only been taking dig 0.0625mg  qd (=1/2 of the 0.125mg  tab qd but then not sure as 90 tabs was sent in 3 mos ago so would actually still have 3 mos left. . . Marland Kitchen)  Advised pt that I will send in new rx for digoxin within the week after which they are to [place] on the bottle and then recheck level in 6 mos. nml digoxin is at nadir after dose - ideally drawn 12 hrs after dose. Did she take it this a.m.?  Still taking ? Tab dig.  Pt on dig for h/o CHF so therapeutic drug level 0.5-0.9 ng/ml (rather than a. Fib is 0.8-2) at last >6 hrs after last dose Consider weaning down/off digoxin since Dr. Einar Gip noted that he thinks her HF might have recovered overtime as EF likely secondary to severe dysfxn/stress at time of CVA rather than permanent cardiac muscle damage - but will watch closely for signs of fluid overload as tapering down on dig dose.   3. Seizure disorder ([place]) - cont keppra  4. Hemiplegia of dominant side as late effect following cerebrovascular disease (HCC) - severe Rt foot drop deformity - never got a new articulating AFO brace - current is still irritated her so she doesn't like to wear it - will f/u with Dr. Paulla Dolly, podiatrist, as it sounds like in his note from their 05/02/16 visit that their orthotist was going to make pt a new brace. . ..  5. Dysarthria as late effect of cerebrovascular accident (CVA) - severe, husband communicates for pt. Pt refuses to use other forms for communication (tablet).  6. Localized osteoporosis without current pathological fracture - DEXA needed since on fosamax - 2 yrs since last - can get in May with next mammogram.  7. Mixed hyperlipidemia - cont lipitor 80  8. Anticoagulated on Coumadin - 2/2 CVA - pt and husband reports that they are  cont to f/u at [facility] coumadin clinic P [phone]. Dose stable per husband - I have not received coumadin clinic note since 09/22/15 so will request updates. Warfarin refilled at prior dose of 5mg  qd.  9. Vitamin D deficiency - never started replacement for prior low low. High dose once wkly x 6 mos - recheck at f/u visit.  10. Screening examination for infectious disease   11. Screening for breast cancer   12. Estrogen deficiency   13. Medication monitoring encounter   14. Cough - suspect viral bronchitis, conservative care with anti-tussives and mucinex for sev wks. If worsening -> ok to fill snap for zpack - paper rx given.    Orders Placed This Encounter  Procedures  . DG Bone Density    Standing Status:   Future    Standing Expiration Date:   03/14/2018    Order Specific Question:   Reason for Exam (SYMPTOM  OR DIAGNOSIS REQUIRED)    Answer:   f/u osteoporosis, on medication    Order Specific Question:   Preferred imaging location?    Answer:  [facility]  . MM Digital Screening    Standing Status:   Future    Standing Expiration Date:   03/14/2018    Order Specific Question:   Reason for Exam (SYMPTOM  OR DIAGNOSIS REQUIRED)    Answer:   screening    Order Specific Question:   Preferred imaging location?    Answer:   [facility]  . DG Chest 2 View    Standing Status:   Future    Number of Occurrences:   1    Standing Expiration Date:   01/12/2018    Order Specific Question:   Reason for Exam (SYMPTOM  OR DIAGNOSIS REQUIRED)    Answer:   cough x 5d, SHoB, CP high risk for aspiration with severe dysarthria s/p CVA 2009    Order Specific Question:   Preferred imaging location?    Answer:   [facility]  . Digoxin level  . Lipid panel    Order Specific Question:   Has the patient fasted?    Answer:   Yes  . Comprehensive metabolic panel    Order Specific Question:   Has the patient fasted?    Answer:   Yes  . HCV Ab w/Rflx to Verification  . Pro b natriuretic peptide    . POCT CBC    Meds ordered this encounter  Medications  . Vitamin D, Ergocalciferol, (DRISDOL) 50000 units CAPS capsule    Sig: Take 1 capsule (50,000 Units total) by mouth every 7 (seven) days.    Dispense:  12 capsule    Refill:  1  . warfarin (COUMADIN) 5 MG tablet    Sig: TAKE AS DIRECTED BY COUMADIN CLINIC    Dispense:  90 tablet    Refill:  1  . DISCONTD: Dextromethorphan-Guaifenesin (MUCINEX DM MAXIMUM STRENGTH) 60-1200 MG TB12    Sig: Take 1 tablet by mouth every 12 (twelve) hours.    Dispense:  14 each    Refill:  1  . benzonatate (TESSALON) 200 MG capsule    Sig: Take 1 capsule (200 mg total) by mouth 3 (three) times daily as needed for cough.    Dispense:  30 capsule    Refill:  0  . azithromycin (ZITHROMAX) 250 MG tablet    Sig: Take 2 tabs PO x 1 dose, then 1 tab PO QD x 4 days    Dispense:  6 tablet    Refill:  0  . Dextromethorphan-Guaifenesin (MUCINEX DM MAXIMUM STRENGTH) 60-1200 MG TB12    Sig: Take 1 tablet by mouth every 12 (twelve) hours.    Dispense:  14 each    Refill:  1   Over 40 min spent in face-to-face evaluation of and consultation with patient and coordination of care.  Over 50% of this time was spent counseling this patient regarding potential of coming off of digoxin, risk of worsening CHF, s/sxs to watch for.  I personally performed the services described in this documentation, which was scribed in my presence. The recorded information has been reviewed and considered, and addended by me as needed.   Delman Cheadle, M.D.  Primary Care at [facility] [address] [place], [place] [address] [phone] phone [phone] fax  01/12/17 6:04 PM

## 2017-01-13 LAB — HCV INTERPRETATION

## 2017-01-13 LAB — COMPREHENSIVE METABOLIC PANEL
A/G RATIO: 1.5 (ref 1.2–2.2)
ALBUMIN: 4.4 g/dL (ref 3.6–4.8)
ALT: 16 IU/L (ref 0–32)
AST: 26 IU/L (ref 0–40)
Alkaline Phosphatase: 125 IU/L — ABNORMAL HIGH (ref 39–117)
BILIRUBIN TOTAL: 0.4 mg/dL (ref 0.0–1.2)
BUN / CREAT RATIO: 15 (ref 12–28)
BUN: 14 mg/dL (ref 8–27)
CALCIUM: 9.1 mg/dL (ref 8.7–10.3)
CHLORIDE: 103 mmol/L (ref 96–106)
CO2: 21 mmol/L (ref 20–29)
Creatinine, Ser: 0.92 mg/dL (ref 0.57–1.00)
GFR, EST AFRICAN AMERICAN: 74 mL/min/{1.73_m2} (ref >59)
GFR, EST NON AFRICAN AMERICAN: 64 mL/min/{1.73_m2} (ref >59)
GLOBULIN, TOTAL: 3 g/dL (ref 1.5–4.5)
Glucose: 88 mg/dL (ref 65–99)
POTASSIUM: 4.1 mmol/L (ref 3.5–5.2)
SODIUM: 141 mmol/L (ref 134–144)
TOTAL PROTEIN: 7.4 g/dL (ref 6.0–8.5)

## 2017-01-13 LAB — DIGOXIN LEVEL: DIGOXIN, SERUM: 0.4 ng/mL — AB (ref 0.5–0.9)

## 2017-01-13 LAB — HCV AB W/RFLX TO VERIFICATION: HCV Ab: 0.1 s/co ratio (ref 0.0–0.9)

## 2017-01-13 LAB — LIPID PANEL
CHOL/HDL RATIO: 3.1 ratio (ref 0.0–4.4)
CHOLESTEROL TOTAL: 121 mg/dL (ref 100–199)
HDL: 39 mg/dL — AB (ref >39)
LDL Calculated: 61 mg/dL (ref 0–99)
TRIGLYCERIDES: 107 mg/dL (ref 0–149)
VLDL Cholesterol Cal: 21 mg/dL (ref 5–40)

## 2017-01-13 LAB — PRO B NATRIURETIC PEPTIDE: NT-Pro BNP: 64 pg/mL (ref 0–301)

## 2017-01-14 NOTE — Progress Notes (Signed)
lmvm to call us back  

## 2017-01-14 NOTE — Progress Notes (Signed)
Called and notified them of you recommendations and they are faxing over her last coumadin level.

## 2017-02-05 ENCOUNTER — Ambulatory Visit: Payer: Medicare HMO | Admitting: Orthotics

## 2017-02-05 DIAGNOSIS — M21371 Foot drop, right foot: Secondary | ICD-10-CM

## 2017-02-05 NOTE — Progress Notes (Signed)
Patient presents today for evaluation/casting for Thermoplastic  brace (r).   Patient has hx of the following conditions: Gait instability,  Ankle instabilty,  Gait analysis done and patient displays abnormality of gait in both sagittial and frontal planes, and could benefit in aggressive ankle support.  Due to severe foot drop secondary to CVA, needs dorsi-assist.

## 2017-02-14 DIAGNOSIS — Z7901 Long term (current) use of anticoagulants: Secondary | ICD-10-CM | POA: Diagnosis not present

## 2017-02-14 DIAGNOSIS — I639 Cerebral infarction, unspecified: Secondary | ICD-10-CM | POA: Diagnosis not present

## 2017-02-18 DIAGNOSIS — Z7901 Long term (current) use of anticoagulants: Secondary | ICD-10-CM

## 2017-03-07 ENCOUNTER — Ambulatory Visit: Payer: Medicare HMO | Admitting: Orthotics

## 2017-03-07 DIAGNOSIS — M21371 Foot drop, right foot: Secondary | ICD-10-CM

## 2017-03-07 DIAGNOSIS — L03031 Cellulitis of right toe: Secondary | ICD-10-CM

## 2017-03-07 NOTE — Progress Notes (Signed)
Patient wasn't able to p/u brace due to the fact that her shoes just will not fit for an articulated brace.  We ordered a MBB shoe, in hopes it will fit better.  Patient is also spastic with little tone in affected foot; brace may have to returned to [place] to add dorsal strap to help secure brace.

## 2017-03-26 ENCOUNTER — Other Ambulatory Visit: Payer: Medicare HMO | Admitting: Orthotics

## 2017-03-28 DIAGNOSIS — I639 Cerebral infarction, unspecified: Secondary | ICD-10-CM | POA: Diagnosis not present

## 2017-03-28 DIAGNOSIS — Z7901 Long term (current) use of anticoagulants: Secondary | ICD-10-CM | POA: Diagnosis not present

## 2017-04-01 ENCOUNTER — Other Ambulatory Visit: Payer: Self-pay | Admitting: Family Medicine

## 2017-04-02 NOTE — Telephone Encounter (Signed)
baclofen refill Last OV: 01/12/17 Last Refill:12/27/16 Pharmacy:[facility]

## 2017-04-11 NOTE — Telephone Encounter (Signed)
Patient's spouse [name] would like a call back to discuss the refill request on 04/01/17. [phone]

## 2017-04-12 ENCOUNTER — Other Ambulatory Visit: Payer: Self-pay | Admitting: Family Medicine

## 2017-04-12 MED ORDER — BACLOFEN 20 MG PO TABS: ORAL_TABLET | ORAL | 0 refills | Status: DC

## 2017-04-12 MED ORDER — LISINOPRIL 10 MG PO TABS: 10.0000 mg | ORAL_TABLET | Freq: Every day | ORAL | 0 refills | Status: DC

## 2017-04-12 MED ORDER — ATORVASTATIN CALCIUM 80 MG PO TABS: 80.0000 mg | ORAL_TABLET | Freq: Every day | ORAL | 0 refills | Status: DC

## 2017-04-12 MED ORDER — LEVETIRACETAM 500 MG PO TABS: 500.0000 mg | ORAL_TABLET | Freq: Two times a day (BID) | ORAL | 0 refills | Status: DC

## 2017-04-12 MED ORDER — CARVEDILOL 6.25 MG PO TABS: 6.2500 mg | ORAL_TABLET | Freq: Two times a day (BID) | ORAL | 0 refills | Status: DC

## 2017-04-12 MED ORDER — ALENDRONATE SODIUM 70 MG PO TABS: ORAL_TABLET | ORAL | 0 refills | Status: DC

## 2017-04-12 MED ORDER — DIGOXIN 125 MCG PO TABS: 0.0625 mg | ORAL_TABLET | Freq: Every day | ORAL | 0 refills | Status: DC

## 2017-04-12 NOTE — Telephone Encounter (Signed)
Called and LVM - med request was 10d ago so I am sure they are completely out. Sent [facility]d supply ot local [facility] and 1 yr to [facility] of all meds.

## 2017-04-22 ENCOUNTER — Other Ambulatory Visit: Payer: Self-pay | Admitting: Family Medicine

## 2017-05-09 DIAGNOSIS — I639 Cerebral infarction, unspecified: Secondary | ICD-10-CM | POA: Diagnosis not present

## 2017-05-09 DIAGNOSIS — Z7901 Long term (current) use of anticoagulants: Secondary | ICD-10-CM | POA: Diagnosis not present

## 2017-06-05 ENCOUNTER — Ambulatory Visit
Admission: RE | Admit: 2017-06-05 | Discharge: 2017-06-05 | Disposition: A | Discharge status: Home / Self Care | Payer: Medicare HMO | Source: Ambulatory Visit | Attending: Family Medicine | Admitting: Family Medicine

## 2017-06-05 DIAGNOSIS — Z1239 Encounter for other screening for malignant neoplasm of breast: Secondary | ICD-10-CM

## 2017-06-05 DIAGNOSIS — M816 Localized osteoporosis [Lequesne]: Secondary | ICD-10-CM

## 2017-06-05 DIAGNOSIS — E2839 Other primary ovarian failure: Secondary | ICD-10-CM

## 2017-06-05 DIAGNOSIS — Z780 Asymptomatic menopausal state: Secondary | ICD-10-CM | POA: Diagnosis not present

## 2017-06-05 DIAGNOSIS — M85852 Other specified disorders of bone density and structure, left thigh: Secondary | ICD-10-CM | POA: Diagnosis not present

## 2017-06-05 DIAGNOSIS — Z1231 Encounter for screening mammogram for malignant neoplasm of breast: Secondary | ICD-10-CM | POA: Diagnosis not present

## 2017-06-08 ENCOUNTER — Other Ambulatory Visit: Payer: Self-pay | Admitting: Family Medicine

## 2017-06-12 ENCOUNTER — Encounter: Payer: Self-pay | Admitting: *Deleted

## 2017-06-24 ENCOUNTER — Other Ambulatory Visit: Payer: Self-pay | Admitting: Family Medicine

## 2017-06-26 NOTE — Telephone Encounter (Signed)
Warfarin 5 mg and vitamin D 50,000 unit refill requests  LOV 01/12/17 with Dr. Brigitte Pulse  [facility] - [place], [place].

## 2017-06-28 ENCOUNTER — Telehealth: Payer: Self-pay | Admitting: Family Medicine

## 2017-06-28 NOTE — Telephone Encounter (Signed)
Copied from [place] [phone]. Topic: Quick Communication - See Telephone Encounter >> Jun 28, 2017  3:10 PM Hewitt Shorts wrote: Pt is needing a refill on warfarin and vitamin d -[facility] mail order [phone]   Best number [phone]

## 2017-06-28 NOTE — Telephone Encounter (Signed)
Duplicate request see TE from 06/24/17

## 2017-07-01 NOTE — Telephone Encounter (Signed)
Time for f/u OV with the next 3 mos

## 2017-07-30 ENCOUNTER — Ambulatory Visit: Payer: Self-pay

## 2017-07-30 NOTE — Telephone Encounter (Signed)
Husband reports pt. Has had "some facial puffiness x 4 weeks." No pain or itching with this. Eating and drinking well. No difficulty swallowing. Request appointment with Dr. Brigitte Pulse. Can not come in this week due to other appointments. Scheduled for next week as requested. Instructed if symptoms worsen to call back. Verbalizes understanding.  Reason for Disposition . [1] Mild facial swelling (puffiness) AND [2] persists > 3 days  Answer Assessment - Initial Assessment Questions 1. ONSET: "When did the swelling start?" (e.g., minutes, hours, days)     Started 4-5 weeks 2. LOCATION: "What part of the face is swollen?"     Cheeks  3. SEVERITY: "How swollen is it?"     Mild 4. ITCHING: "Is there any itching?" If so, ask: "How much?"   (Scale 1-10; mild, moderate or severe)     No 5. PAIN: "Is the swelling painful to touch?" If so, ask: "How painful is it?"   (Scale 1-10; mild, moderate or severe)     No 6. FEVER: "Do you have a fever?" If so, ask: "What is it, how was it measured, and when did it start?"      No 7. CAUSE: "What do you think is causing the face swelling?"     Unsure 8. RECURRENT SYMPTOM: "Have you had face swelling before?" If so, ask: "When was the last time?" "What happened that time?"     Yes 9. OTHER SYMPTOMS: "Do you have any other symptoms?" (e.g., toothache, leg swelling)     Right knee is swollen 10. PREGNANCY: "Is there any chance you are pregnant?" "When was your last menstrual period?"       No  Protocols used: [facility]

## 2017-08-01 DIAGNOSIS — Z7901 Long term (current) use of anticoagulants: Secondary | ICD-10-CM | POA: Diagnosis not present

## 2017-08-01 DIAGNOSIS — I639 Cerebral infarction, unspecified: Secondary | ICD-10-CM | POA: Diagnosis not present

## 2017-08-07 ENCOUNTER — Ambulatory Visit (INDEPENDENT_AMBULATORY_CARE_PROVIDER_SITE_OTHER): Payer: Medicare HMO | Admitting: Family Medicine

## 2017-08-07 ENCOUNTER — Encounter: Payer: Self-pay | Admitting: Family Medicine

## 2017-08-07 ENCOUNTER — Other Ambulatory Visit: Payer: Self-pay

## 2017-08-07 VITALS — BP 120/66 | HR 79 | Temp 99.0°F | Ht 66.0 in | Wt 124.0 lb

## 2017-08-07 DIAGNOSIS — I509 Heart failure, unspecified: Secondary | ICD-10-CM | POA: Diagnosis not present

## 2017-08-07 DIAGNOSIS — R531 Weakness: Secondary | ICD-10-CM

## 2017-08-07 DIAGNOSIS — R2681 Unsteadiness on feet: Secondary | ICD-10-CM | POA: Diagnosis not present

## 2017-08-07 DIAGNOSIS — M21371 Foot drop, right foot: Secondary | ICD-10-CM

## 2017-08-07 DIAGNOSIS — R220 Localized swelling, mass and lump, head: Secondary | ICD-10-CM

## 2017-08-07 DIAGNOSIS — L600 Ingrowing nail: Secondary | ICD-10-CM

## 2017-08-07 DIAGNOSIS — B029 Zoster without complications: Secondary | ICD-10-CM | POA: Diagnosis not present

## 2017-08-07 DIAGNOSIS — M109 Gout, unspecified: Secondary | ICD-10-CM

## 2017-08-07 DIAGNOSIS — Z79899 Other long term (current) drug therapy: Secondary | ICD-10-CM

## 2017-08-07 DIAGNOSIS — I69959 Hemiplegia and hemiparesis following unspecified cerebrovascular disease affecting unspecified side: Secondary | ICD-10-CM

## 2017-08-07 DIAGNOSIS — Z7901 Long term (current) use of anticoagulants: Secondary | ICD-10-CM | POA: Diagnosis not present

## 2017-08-07 DIAGNOSIS — M25461 Effusion, right knee: Secondary | ICD-10-CM

## 2017-08-07 DIAGNOSIS — M2450 Contracture, unspecified joint: Secondary | ICD-10-CM | POA: Diagnosis not present

## 2017-08-07 LAB — POCT CBC
GRANULOCYTE PERCENT: 64.9 % (ref 37–80)
HCT, POC: 42.3 % (ref 37.7–47.9)
HEMOGLOBIN: 12.5 g/dL (ref 12.2–16.2)
Lymph, poc: 2.2 (ref 0.6–3.4)
MCH, POC: 24.9 pg — AB (ref 27–31.2)
MCHC: 29.5 g/dL — AB (ref 31.8–35.4)
MCV: 84.4 fL (ref 80–97)
MID (cbc): 0.5 (ref 0–0.9)
MPV: 7.7 fL (ref 0–99.8)
PLATELET COUNT, POC: 241 10*3/uL (ref 142–424)
POC GRANULOCYTE: 5 (ref 2–6.9)
POC LYMPH %: 28.4 % (ref 10–50)
POC MID %: 6.7 %M (ref 0–12)
RBC: 5.01 M/uL (ref 4.04–5.48)
RDW, POC: 14.6 %
WBC: 7.7 10*3/uL (ref 4.6–10.2)

## 2017-08-07 MED ORDER — WHEELCHAIR MISC: 1.0000 [IU] | 0 refills | Status: DC | PRN

## 2017-08-07 MED ORDER — VALACYCLOVIR HCL 1 G PO TABS: 1000.0000 mg | ORAL_TABLET | Freq: Three times a day (TID) | ORAL | 0 refills | Status: DC

## 2017-08-07 NOTE — Patient Instructions (Addendum)
Soak your toe in warm water with soap or tea tree oil in it got 10 min 3x/d and try to keep gently pulling back the tissue from around the toe. Make an appointment with one of our PAs (for instance Philis Fendt, Tenna Delaine, Whitney McVae) to have a section of your toenail removed to treat your ingrown toenail if it is continue to bother you.  Ice your knee for 15 min 3x/d and wear an ace wrap for 5 days. Whenever the swelling comes back, restart this immediately and leave the ace wrap on for at least 3d.  Stop the lisinopril 10mg   Recommend going to [place] for a new wheelchair though likely any Durable Medical Equipment/Medical supply store would be adequate.  [address] [place], [place] [address] Telephone number: [phone]  Other locations: [facility] supply Address: [address] Telephone Number: [phone]  [place] Address: [address] Telephone Number: [phone]    Recommend calling occupational therapy at [facility] to get an appointment for a new brace. If they can't make one for you, I would be happy to send you to a different brace shop.        IF you received an x-ray today, you will receive an invoice from [facility]. Please contact [facility] at [phone] with questions or concerns regarding your invoice.   IF you received labwork today, you will receive an invoice from [facility]. Please contact [facility] at [phone] with questions or concerns regarding your invoice.   Our billing staff will not be able to assist you with questions regarding bills from these companies.  You will be contacted with the lab results as soon as they are available. The fastest way to get your results is to activate your My Chart account. Instructions are located on the last page of this paperwork. If you have not heard from [place] regarding the results in 2 weeks, please contact this  office.      Shingles Shingles, which is also known as herpes zoster, is an infection that causes a painful skin rash and fluid-filled blisters. Shingles is not related to genital herpes, which is a sexually transmitted infection. Shingles only develops in people who:  Have had chickenpox.  Have received the chickenpox vaccine. (This is rare.)  What are the causes? Shingles is caused by varicella-zoster virus (VZV). This is the same virus that causes chickenpox. After exposure to VZV, the virus stays in the body in an inactive (dormant) state. Shingles develops if the virus reactivates. This can happen many years after the initial exposure to VZV. It is not known what causes this virus to reactivate. What increases the risk? People who have had chickenpox or received the chickenpox vaccine are at risk for shingles. Infection is more common in people who:  Are older than age 33.  Have a weakened defense (immune) system, such as those with HIV, AIDS, or cancer.  Are taking medicines that weaken the immune system, such as transplant medicines.  Are under great stress.  What are the signs or symptoms? Early symptoms of this condition include itching, tingling, and pain in an area on your skin. Pain may be described as burning, stabbing, or throbbing. A few days or weeks after symptoms start, a painful red rash appears, usually on one side of the body in a bandlike or beltlike pattern. The rash eventually turns into fluid-filled blisters that break open, scab over, and dry up in  about 2-3 weeks. At any time during the infection, you may also develop:  A fever.  Chills.  A headache.  An upset stomach.  How is this diagnosed? This condition is diagnosed with a skin exam. Sometimes, skin or fluid samples are taken from the blisters before a diagnosis is made. These samples are examined under a microscope or sent to a lab for testing. How is this treated? There is no specific cure  for this condition. Your health care provider will probably prescribe medicines to help you manage pain, recover more quickly, and avoid long-term problems. Medicines may include:  Antiviral drugs.  Anti-inflammatory drugs.  Pain medicines.  If the area involved is on your face, you may be referred to a specialist, such as an eye doctor (ophthalmologist) or an ear, nose, and throat (ENT) doctor to help you avoid eye problems, chronic pain, or disability. Follow these instructions at home: Medicines  Take medicines only as directed by your health care provider.  Apply an anti-itch or numbing cream to the affected area as directed by your health care provider. Blister and Rash Care  Take a cool bath or apply cool compresses to the area of the rash or blisters as directed by your health care provider. This may help with pain and itching.  Keep your rash covered with a loose bandage (dressing). Wear loose-fitting clothing to help ease the pain of material rubbing against the rash.  Keep your rash and blisters clean with mild soap and cool water or as directed by your health care provider.  Check your rash every day for signs of infection. These include redness, swelling, and pain that lasts or increases.  Do not pick your blisters.  Do not scratch your rash. General instructions  Rest as directed by your health care provider.  Keep all follow-up visits as directed by your health care provider. This is important.  Until your blisters scab over, your infection can cause chickenpox in people who have never had it or been vaccinated against it. To prevent this from happening, avoid contact with other people, especially: ? Babies. ? Pregnant women. ? Children who have eczema. ? Elderly people who have transplants. ? People who have chronic illnesses, such as leukemia or AIDS. Contact a health care provider if:  Your pain is not relieved with prescribed medicines.  Your pain does  not get better after the rash heals.  Your rash looks infected. Signs of infection include redness, swelling, and pain that lasts or increases. Get help right away if:  The rash is on your face or nose.  You have facial pain, pain around your eye area, or loss of feeling on one side of your face.  You have ear pain or you have ringing in your ear.  You have loss of taste.  Your condition gets worse. This information is not intended to replace advice given to you by your health care provider. Make sure you discuss any questions you have with your health care provider. Document Released: 01/08/2005 Document Revised: 09/04/2015 Document Reviewed: 11/19/2013 Elsevier Interactive Patient Education  2018 [place].  Knee Injection A knee injection is a procedure to get medicine into your knee joint. Your health care provider puts a needle into the joint and injects medicine with an attached syringe. The injected medicine may relieve the pain, swelling, and stiffness of arthritis. The injected medicine may also help to lubricate and cushion your knee joint. You may need more than one injection. Tell a  health care provider about:  Any allergies you have.  All medicines you are taking, including vitamins, herbs, eye drops, creams, and over-the-counter medicines.  Any problems you or family members have had with anesthetic medicines.  Any blood disorders you have.  Any surgeries you have had.  Any medical conditions you have. What are the risks? Generally, this is a safe procedure. However, problems may occur, including:  Infection.  Bleeding.  Worsening symptoms.  Damage to the area around your knee.  Allergic reaction to any of the medicines.  Skin reactions from repeated injections.  What happens before the procedure?  Ask your health care provider about changing or stopping your regular medicines. This is especially important if you are taking diabetes medicines or  blood thinners.  Plan to have someone take you home after the procedure. What happens during the procedure?  You will sit or lie down in a position for your knee to be treated.  The skin over your kneecap will be cleaned with a germ-killing solution (antiseptic).  You will be given a medicine that numbs the area (local anesthetic). You may feel some stinging.  After your knee becomes numb, you will have a second injection. This is the medicine. This needle is carefully placed between your kneecap and your knee. The medicine is injected into the joint space.  At the end of the procedure, the needle will be removed.  A bandage (dressing) may be placed over the injection site. The procedure may vary among health care providers and hospitals. What happens after the procedure?  You may have to move your knee through its full range of motion. This helps to get all of the medicine into your joint space.  Your blood pressure, heart rate, breathing rate, and blood oxygen level will be monitored often until the medicines you were given have worn off.  You will be watched to make sure that you do not have a reaction to the injected medicine. This information is not intended to replace advice given to you by your health care provider. Make sure you discuss any questions you have with your health care provider. Document Released: 04/01/2006 Document Revised: 06/10/2015 Document Reviewed: 11/18/2013 Elsevier Interactive Patient Education  2018 [place] Knee Injection, Care After Refer to this sheet in the next few weeks. These instructions provide you with information about caring for yourself after your procedure. Your health care provider may also give you more specific instructions. Your treatment has been planned according to current medical practices, but problems sometimes occur. Call your health care provider if you have any problems or questions after your procedure. What can I expect  after the procedure? After the procedure, it is common to have:  Soreness.  Warmth.  Swelling.  You may have more pain, swelling, and warmth than you did before the injection. This reaction may last for about one day. Follow these instructions at home: Bathing  If you were given a bandage (dressing), keep it dry until your health care provider says it can be removed. Ask your health care provider when you can start showering or taking a bath. Managing pain, stiffness, and swelling  If directed, apply ice to the injection area: ? Put ice in a plastic bag. ? Place a towel between your skin and the bag. ? Leave the ice on for 20 minutes, 2-3 times per day.  Do not apply heat to your knee.  Raise the injection area above the level of your heart while you  are sitting or lying down. Activity  Avoid strenuous activities for as long as directed by your health care provider. Ask your health care provider when you can return to your normal activities. General instructions  Take medicines only as directed by your health care provider.  Do not take aspirin or other over-the-counter medicines unless your health care provider says you can.  Check your injection site every day for signs of infection. Watch for: ? Redness, swelling, or pain. ? Fluid, blood, or pus.  Follow your health care provider's instructions about dressing changes and removal. Contact a health care provider if:  You have symptoms at your injection site that last longer than two days after your procedure.  You have redness, swelling, or pain in your injection area.  You have fluid, blood, or pus coming from your injection site.  You have warmth in your injection area.  You have a fever.  Your pain is not controlled with medicine. Get help right away if:  Your knee turns very red.  Your knee becomes very swollen.  Your knee pain is severe. This information is not intended to replace advice given to you by  your health care provider. Make sure you discuss any questions you have with your health care provider. Document Released: 01/29/2014 Document Revised: 09/14/2015 Document Reviewed: 11/18/2013 Elsevier Interactive Patient Education  [employer].

## 2017-08-07 NOTE — Progress Notes (Signed)
Subjective:  By signing my name below, I, Moises Blood, attest that this documentation has been prepared under the direction and in the presence of Delman Cheadle, MD. Electronically Signed: Moises Blood, [place]. 08/07/2017 , 3:25 PM .  Patient was seen in Room 1 .   Patient ID: [name], female    DOB: Apr 27, 1950, 67 y.o.   MRN: [id number] Chief Complaint  Patient presents with  . right side body swelling    along with rash that comes out in different areas of the body, Now its the inner arm and left shoulder area   HPI [name] is a 67 y.o. female who presents to Primary Care at [facility] complaining of right sided body swelling with a rash. Her husband noticed a rash appearing behind her left arm in her left upper back, about 2 weeks ago. Her husband's been applying ointment over the affected areas for some improvement, but then broke out more over her left arm. Over night, an area appeared over her left chest upper chest wall. She denies any pain.   Patient also states having swelling in her right knee starting about 4-6 weeks ago. She then had swelling over the right side of her jaw as well as left great toe. She had mentioned right knee swelling has been intermittent and now more constant. Her left great toe has been constantly painful and swelling for 4 weeks. She denies history of gout.   Her husband also mentions patient had received a right wrist brace from occupational therapy, but it broke. They've been taping the broken piece over but it's been very inconvenient and not helping her heal. Her occupational therapist has given her an ankle brace that she does wear to help with her ankles. Her podiatrist recently made her a new right leg brace, but it's uncomfortable to wear compared to her older one. She's been wearing the old leg brace more than the new one. Her husband also requests a new wheelchair, as their current one is about 69 years old. Discussed option of electric  wheelchair in the past, but she's able to utilize a cane with her braces at home; additionally, had thought about the inconvenience of having an electric wheelchair.   Patient is brought in by her husband.   Past Medical History:  Diagnosis Date  . Adenomatous polyp of colon 09/2012   repeat colonoscopy in 5 years by Dr. Sharlett Iles  . CHF (congestive heart failure) (HCC)    EF 15-20% as of 05/28/11 (Dr [facility] summary)  . CVA (cerebral infarction)   . Hemiparesis ([place])   . Hyperlipidemia   . Hypertension   . Seizures ([place])   . Sickle cell anemia (HCC)   . Stroke ([place])   . Vitamin D deficiency 2010   Past Surgical History:  Procedure Laterality Date  . ABDOMINAL HYSTERECTOMY    . FRACTURE SURGERY    . HIP SURGERY    . TUBAL LIGATION     Prior to Admission medications   Medication Sig Start Date End Date Taking? Authorizing Provider  alendronate (FOSAMAX) 70 MG tablet TAKE 1 TABLET EVERY 7 DAYS WITH FULL GLASS OF WATER ON AN EMPTY STOMACH 04/12/17   [name], MD  alendronate (FOSAMAX) 70 MG tablet TAKE 1 TABLET EVERY 7 DAYS WITH FULL GLASS OF WATER ON AN EMPTY STOMACH 04/12/17   [name], MD  atorvastatin (LIPITOR) 80 MG tablet TAKE 1 TABLET AT BEDTIME 04/12/17   [name],  MD  atorvastatin (LIPITOR) 80 MG tablet Take 1 tablet (80 mg total) by mouth at bedtime. 04/12/17   [name], MD  baclofen (LIORESAL) 20 MG tablet TAKE 1 TABLET THREE TIMES DAILY (MAY TAKE AN ADDITIONAL TABLET MID DAY OR BEFORE BED AS NEEDED FOR MUSCLE SPASMS) 04/12/17   [name], MD  baclofen (LIORESAL) 20 MG tablet TAKE 1 TABLET THREE TIMES DAILY (MAY TAKE AN ADDITIONAL TABLET MID-DAY OR BEFORE BED AS NEEDED FOR MUSCLE SPASMS) 04/12/17   [name], MD  benzonatate (TESSALON) 200 MG capsule Take 1 capsule (200 mg total) by mouth 3 (three) times daily as needed for cough. 01/12/17   [name], MD  carvedilol (COREG) 6.25 MG tablet TAKE 1 TABLET TWICE DAILY 04/12/17   [name], MD    carvedilol (COREG) 6.25 MG tablet Take 1 tablet (6.25 mg total) by mouth 2 (two) times daily. 04/12/17   [name], MD  Dextromethorphan-Guaifenesin [facility] DM MAXIMUM STRENGTH) 60-1200 MG TB12 Take 1 tablet by mouth every 12 (twelve) hours. 01/12/17   [name], MD  digoxin (LANOXIN) 0.125 MG tablet Take 0.5 tablets (0.0625 mg total) by mouth daily. 04/12/17   [name], MD  digoxin (LANOXIN) 0.125 MG tablet TAKE 1 TABLET EVERY DAY 06/09/17   [name], MD  docusate sodium (COLACE) 50 MG capsule Take 1 capsule (50 mg total) by mouth daily. 03/26/14   [name], MD  hydrocortisone (ANUSOL-HC) 25 MG suppository Place 1 suppository (25 mg total) rectally 2 (two) times daily as needed for hemorrhoids or itching. 04/16/16   [name], MD  levETIRAcetam (KEPPRA) 500 MG tablet TAKE 1 TABLET TWICE DAILY 04/12/17   [name], MD  levETIRAcetam (KEPPRA) 500 MG tablet Take 1 tablet (500 mg total) by mouth 2 (two) times daily. 04/12/17   [name], MD  Liniments (HEET TRIPLE ACTION EX) Apply 1 application topically as needed (for pain).    [provider]  lisinopril (PRINIVIL,ZESTRIL) 10 MG tablet TAKE 1 TABLET (10 MG TOTAL) BY MOUTH DAILY. 04/12/17   [name], MD  lisinopril (PRINIVIL,ZESTRIL) 10 MG tablet Take 1 tablet (10 mg total) by mouth daily. 04/12/17   [name], MD  nystatin (MYCOSTATIN/NYSTOP) powder APPLY TOPICALLY FOUR TIMES DAILY. 04/22/17   [name], MD  polyethylene glycol [facility] Floria Raveling) packet Take 17 g by mouth daily as needed for moderate constipation.    [provider]  triamcinolone cream (KENALOG) 0.1 % Apply 1 application topically 2 (two) times daily. 03/26/14   [name], MD  Vitamin D, Ergocalciferol, (DRISDOL) 50000 units CAPS capsule TAKE 1 CAPSULE EVERY 7 DAYS 07/01/17   [name], MD  warfarin (COUMADIN) 5 MG tablet TAKE AS DIRECTED BY COUMADIN CLINIC 07/01/17   [name], MD   No Known Allergies Family History  Problem Relation Age of  Onset  . Diabetes Daughter   . Colon cancer Maternal Grandfather    Social History   Socioeconomic History  . Marital status: Married    Spouse name: Not on file  . Number of children: 2  . Years of education: 66  . Highest education level: Not on file  Occupational History    Employer: RETIRED  Social Needs  . Financial resource strain: Not on file  . Food insecurity:    Worry: Not on file    Inability: Not on file  . Transportation needs:  Medical: Not on file    Non-medical: Not on file  Tobacco Use  . Smoking status: Former Smoker    Types: Cigarettes    Last attempt to quit: 09/26/2007    Years since quitting: 9.8  . Smokeless tobacco: Never Used  Substance and Sexual Activity  . Alcohol use: No  . Drug use: No  . Sexual activity: Never  Lifestyle  . Physical activity:    Days per week: Not on file    Minutes per session: Not on file  . Stress: Not on file  Relationships  . Social connections:    Talks on phone: Not on file    Gets together: Not on file    Attends religious service: Not on file    Active member of club or organization: Not on file    Attends meetings of clubs or organizations: Not on file    Relationship status: Not on file  Other Topics Concern  . Not on file  Social History Narrative  . Not on file   Depression screen [facility] 2/9 08/07/2017 01/12/2017 04/16/2016 04/05/2016 09/30/2014  Decreased Interest 0 0 0 0 0  Down, Depressed, Hopeless 0 0 0 0 0  PHQ - 2 Score 0 0 0 0 0    Review of Systems  Constitutional: Negative for chills, fatigue, fever and unexpected weight change.  Respiratory: Negative for cough.   Gastrointestinal: Negative for constipation, diarrhea, nausea and vomiting.  Musculoskeletal: Positive for arthralgias, gait problem, joint swelling and myalgias.  Skin: Positive for rash. Negative for wound.  Neurological: Negative for dizziness, weakness and headaches.       Objective:   Physical Exam  Constitutional: She is  oriented to person, place, and time. She appears well-developed and well-nourished. No distress.  HENT:  Head: Normocephalic and atraumatic.  Eyes: Pupils are equal, round, and reactive to light. EOM are normal.  Neck: Neck supple.  Cardiovascular: Normal rate.  Pulmonary/Chest: Effort normal. No respiratory distress.  Musculoskeletal: Normal range of motion.  Left foot: tender over the first MCP on the left but hyperaesthetic over the first distal phalanx, there is callus and hardening of the lateral distal nail fold Right great toe: lateral nail fold with slight ecchymosis  Neurological: She is alert and oriented to person, place, and time.  Skin: Skin is warm and dry.  Psychiatric: She has a normal mood and affect. Her behavior is normal.  Nursing note and vitals reviewed.   BP 120/66 (BP Location: Right Arm, Patient Position: Sitting, Cuff Size: Normal)   Pulse 79   Temp 99 F (37.2 C) (Oral)   Ht 5\' 6"  (1.676 m)   Wt 124 lb (56.2 kg)   SpO2 96%   BMI 20.01 kg/m      Assessment & Plan:   1. Contracture of joint   2. Acquired right foot drop   3. Gait instability   4. Weakness   5. Hemiplegia of dominant side as late effect following cerebrovascular disease ([place])   6. Effusion of right knee   7. Podagra   8. Ingrown toenail of right foot   9. Herpes zoster without complication   10. Right facial swelling   11. Anticoagulated on Coumadin   12. Encounter for long-term current use of high risk medication     Orders Placed This Encounter  Procedures  . Uric acid  . C-reactive protein  . Sedimentation Rate  . Digoxin, Random, Serum  . Protime-INR  . Comprehensive metabolic panel  .  Uric acid  . Sedimentation rate  . C-reactive protein  . POCT CBC    Meds ordered this encounter  Medications  . Misc. Devices [facility]) MISC    Sig: 1 Units by Does not apply route as needed. Reason: I69.959, R53.1, R26.81, M21.371, M24.50    Dispense:  1 each    Refill:  0    . DISCONTD: valACYclovir (VALTREX) 1000 MG tablet    Sig: Take 1 tablet (1,000 mg total) by mouth 3 (three) times daily.    Dispense:  21 tablet    Refill:  0    I personally performed the services described in this documentation, which was scribed in my presence. The recorded information has been reviewed and considered, and addended by me as needed.   Delman Cheadle, M.D.  Primary Care at [facility] [address] [place], [place] [address] [phone] phone [phone] fax  08/28/17 10:11 AM

## 2017-08-08 LAB — COMPREHENSIVE METABOLIC PANEL
ALK PHOS: 160 IU/L — AB (ref 39–117)
ALT: 11 IU/L (ref 0–32)
AST: 25 IU/L (ref 0–40)
Albumin/Globulin Ratio: 1.4 (ref 1.2–2.2)
Albumin: 4.6 g/dL (ref 3.6–4.8)
BUN/Creatinine Ratio: 14 (ref 12–28)
BUN: 13 mg/dL (ref 8–27)
Bilirubin Total: 0.3 mg/dL (ref 0.0–1.2)
CO2: 22 mmol/L (ref 20–29)
CREATININE: 0.96 mg/dL (ref 0.57–1.00)
Calcium: 9.3 mg/dL (ref 8.7–10.3)
Chloride: 103 mmol/L (ref 96–106)
GFR calc Af Amer: 70 mL/min/{1.73_m2} (ref >59)
GFR calc non Af Amer: 61 mL/min/{1.73_m2} (ref >59)
GLUCOSE: 96 mg/dL (ref 65–99)
Globulin, Total: 3.4 g/dL (ref 1.5–4.5)
Potassium: 4.7 mmol/L (ref 3.5–5.2)
Sodium: 142 mmol/L (ref 134–144)
Total Protein: 8 g/dL (ref 6.0–8.5)

## 2017-08-08 LAB — PROTIME-INR
INR: 2.3 — AB (ref 0.8–1.2)
Prothrombin Time: 22.6 s — ABNORMAL HIGH (ref 9.1–12.0)

## 2017-08-08 LAB — DIGOXIN, RANDOM, SERUM: DIGOXIN, RANDOM, SERUM: 1.1 ng/mL

## 2017-08-08 LAB — C-REACTIVE PROTEIN: CRP: 13 mg/L — ABNORMAL HIGH (ref 0–10)

## 2017-08-08 LAB — SEDIMENTATION RATE: SED RATE: 33 mm/h (ref 0–40)

## 2017-08-08 LAB — URIC ACID: URIC ACID: 5.3 mg/dL (ref 2.5–7.1)

## 2017-08-28 ENCOUNTER — Ambulatory Visit (INDEPENDENT_AMBULATORY_CARE_PROVIDER_SITE_OTHER): Payer: Medicare HMO | Admitting: Family Medicine

## 2017-08-28 ENCOUNTER — Encounter: Payer: Self-pay | Admitting: Family Medicine

## 2017-08-28 ENCOUNTER — Other Ambulatory Visit: Payer: Self-pay

## 2017-08-28 VITALS — BP 118/70 | HR 68 | Temp 98.0°F | Resp 16 | Ht 66.0 in | Wt 124.0 lb

## 2017-08-28 DIAGNOSIS — M2450 Contracture, unspecified joint: Secondary | ICD-10-CM | POA: Diagnosis not present

## 2017-08-28 DIAGNOSIS — R2681 Unsteadiness on feet: Secondary | ICD-10-CM | POA: Diagnosis not present

## 2017-08-28 DIAGNOSIS — I69959 Hemiplegia and hemiparesis following unspecified cerebrovascular disease affecting unspecified side: Secondary | ICD-10-CM

## 2017-08-28 DIAGNOSIS — M21371 Foot drop, right foot: Secondary | ICD-10-CM

## 2017-08-28 DIAGNOSIS — R531 Weakness: Secondary | ICD-10-CM | POA: Diagnosis not present

## 2017-08-28 NOTE — Patient Instructions (Signed)
     IF you received an x-ray today, you will receive an invoice from [facility]. Please contact [facility] at [phone] with questions or concerns regarding your invoice.   IF you received labwork today, you will receive an invoice from [facility]. Please contact [facility] at [phone] with questions or concerns regarding your invoice.   Our billing staff will not be able to assist you with questions regarding bills from these companies.  You will be contacted with the lab results as soon as they are available. The fastest way to get your results is to activate your My Chart account. Instructions are located on the last page of this paperwork. If you have not heard from us regarding the results in 2 weeks, please contact this office.     

## 2017-08-29 ENCOUNTER — Telehealth: Payer: Self-pay | Admitting: Family Medicine

## 2017-08-29 ENCOUNTER — Encounter: Payer: Self-pay | Admitting: Gastroenterology

## 2017-08-29 NOTE — Telephone Encounter (Signed)
Copied from [place] [phone]. Topic: Inquiry >> Aug 28, 2017  2:24 PM Synthia Innocent wrote: Reason for CRM: Husband stating [place] is going to fax over additional paper work for wheel chair, [facility] # [phone]

## 2017-09-03 NOTE — Telephone Encounter (Signed)
Please advise.  Can you check your box for this correspondence. Dgaddy, CMA

## 2017-09-05 ENCOUNTER — Other Ambulatory Visit: Payer: Self-pay | Admitting: Family Medicine

## 2017-09-05 NOTE — Telephone Encounter (Signed)
Baclofen refill Last Refill:04/12/17 #120 Last OV: 08/28/17 PCP: Dr. Brigitte Pulse Pharmacy: [facility]

## 2017-09-05 NOTE — Telephone Encounter (Signed)
Copied from [place] [id number]. Topic: Quick Communication - Rx Refill/Question >> Sep 05, 2017  5:48 PM Selinda Flavin B, NT wrote: Medication: baclofen (LIORESAL) 20 MG tablet  Has the patient contacted their pharmacy? Yes.   (Agent: If no, request that the patient contact the pharmacy for the refill.) (Agent: If yes, when and what did the pharmacy advise?)  Preferred Pharmacy (with phone number or street name): [facility]  Agent: Please be advised that RX refills may take up to 3 business days. We ask that you follow-up with your pharmacy.

## 2017-09-09 MED ORDER — BACLOFEN 20 MG PO TABS: ORAL_TABLET | ORAL | 2 refills | Status: DC

## 2017-09-09 NOTE — Telephone Encounter (Signed)
pls advise

## 2017-09-10 ENCOUNTER — Telehealth: Payer: Self-pay | Admitting: Family Medicine

## 2017-09-10 ENCOUNTER — Other Ambulatory Visit: Payer: Self-pay | Admitting: Family Medicine

## 2017-09-10 NOTE — Telephone Encounter (Signed)
Copied from [place]. Topic: General - Other >> Sep 10, 2017 10:35 AM Cecelia Byars, NT wrote: Reason for CRM: Patients husband called and said they were given a prescription for  wheel chair and  they are getting [facility] to fill they  stated,  they need more information before they are able to fill, the request ,and they network with [facility] please call advance   [phone] this is also a follow up call please give a return call at [phone]

## 2017-09-10 NOTE — Telephone Encounter (Signed)
Warfarin sodium refill Last Refill:07/01/17 # 90 tabs No RF Last OV: 08/07/17 PCP: Dr Brigitte Pulse Pharmacy:[facility]

## 2017-09-12 DIAGNOSIS — I639 Cerebral infarction, unspecified: Secondary | ICD-10-CM | POA: Diagnosis not present

## 2017-09-12 DIAGNOSIS — Z7901 Long term (current) use of anticoagulants: Secondary | ICD-10-CM | POA: Diagnosis not present

## 2017-09-12 NOTE — Telephone Encounter (Signed)
Pts husband calling to check status on the wheelchair order. Please advise.

## 2017-09-17 ENCOUNTER — Telehealth: Payer: Self-pay | Admitting: Family Medicine

## 2017-09-17 NOTE — Telephone Encounter (Signed)
Copied from [place]. Topic: General - Other >> Sep 17, 2017 12:06 PM Keene Breath wrote: Patient's husband called again to get the status of his wife's wheelchair.  He was told by [facility] that the doctor has to give them more information in order for them to get the wheelchair.  This has been in process since 08/27/17 when the patient requested the chair.  Family is very concerned and would like a response as soon as possible.  CB# [phone].

## 2017-09-17 NOTE — Telephone Encounter (Signed)
Copied from [place]. Topic: General - Other >> Sep 10, 2017 10:35 AM Cecelia Byars, NT wrote: Reason for CRM: Patients husband called and said they were given a prescription for  wheel chair and  they are getting [facility] to fill they  stated,  they need more information before they are able to fill, the request ,and they network with [facility] please call advance   [phone] x [phone] this is also a follow up call please give a return call at [phone] >> Sep 17, 2017 12:06 PM Keene Breath wrote: Patient's husband called again to get the status of his wife's wheelchair.  He was told by [facility] that the doctor has to give them more information in order for them to get the wheelchair.  This has been in process since 08/27/17 when the patient requested the chair.  Family is very concerned and would like a response as soon as possible.  CB# [phone].

## 2017-09-18 NOTE — Telephone Encounter (Addendum)
There are 3 different telephone encounters on the same thing - looks like some request a call to [facility], some to [facility], and 1 requests papers that our staff will need to complete and bring to me for sig. Wheelchair was discussed and ordered in [place] in 08/07/17 OV  As noted on 8/20 telephone call: Please call [employer] see what is needed - I THINK that husband reported that they needed a 60 in wheelchair (ok to be a pediatric version) rather than the standard 18 in as that can't fit through the doorways in their house.  Pt is non-ambulatory due to CVA - she can transfer with minimal assist and walk short distances.  It was for a reg push wheelchair - not electrical or motorized. Marland Kitchen Marland Kitchen

## 2017-09-18 NOTE — Progress Notes (Signed)
Subjective:    Patient ID: [name], female    DOB: [birth year], 67 y.o.   MRN: [id number] Chief Complaint  Patient presents with  . Contracture of Joint    follow-up form OV 7/17, pt states she feels better since last OV. Pt also states her toe is better since last OV  . Herpes Zoster    pt states she feeling the shingles is clearing up     HPI Rash is completely gone and pt doesn't have any more pain.  Pt's husband though that they got her hand splint which she needs to wear to prevent contractures just 6 mos ago at [facility] and already falling apart so pt refuses to wear it. When they initially had one made at a different private location after her acute CVA it was much more durable and lasted for many yrs - they would like to know where to go to get a stronger one like that.  Needs a 16 in wheelchair (ok to be a pediatric version) rather than the standard 18 in as that can't fit through the doorways in their house. They need a rx for this and need to know where to go get it.   Past Medical History:  Diagnosis Date  . Adenomatous polyp of colon 09/2012   repeat colonoscopy in 5 years by Dr. Sharlett Iles  . CHF (congestive heart failure) (HCC)    EF 15-20% as of 05/28/11 (Dr [facility] summary)  . CVA (cerebral infarction)   . Hemiparesis ([place])   . Hyperlipidemia   . Hypertension   . Seizures ([place])   . Sickle cell anemia (HCC)   . Stroke ([place])   . Vitamin D deficiency 2010   Past Surgical History:  Procedure Laterality Date  . ABDOMINAL HYSTERECTOMY    . FRACTURE SURGERY    . HIP SURGERY    . TUBAL LIGATION     Current Outpatient Medications on File Prior to Visit  Medication Sig Dispense Refill  . alendronate (FOSAMAX) 70 MG tablet TAKE 1 TABLET EVERY 7 DAYS WITH FULL GLASS OF WATER ON AN EMPTY STOMACH 4 tablet 0  . atorvastatin (LIPITOR) 80 MG tablet Take 1 tablet (80 mg total) by mouth at bedtime. 30 tablet 0  . carvedilol (COREG) 6.25 MG tablet Take  1 tablet (6.25 mg total) by mouth 2 (two) times daily. 60 tablet 0  . digoxin (LANOXIN) 0.125 MG tablet TAKE 1 TABLET EVERY DAY 90 tablet 3  . docusate sodium (COLACE) 50 MG capsule Take 1 capsule (50 mg total) by mouth daily. 90 capsule 3  . hydrocortisone (ANUSOL-HC) 25 MG suppository Place 1 suppository (25 mg total) rectally 2 (two) times daily as needed for hemorrhoids or itching. 12 suppository 2  . levETIRAcetam (KEPPRA) 500 MG tablet Take 1 tablet (500 mg total) by mouth 2 (two) times daily. 60 tablet 0  . Liniments (HEET TRIPLE ACTION EX) Apply 1 application topically as needed (for pain).    . Misc. Devices [facility]) MISC 1 Units by Does not apply route as needed. Reason: I69.959, R53.1, R26.81, M21.371, M24.50 1 each 0  . nystatin (MYCOSTATIN/NYSTOP) powder APPLY TOPICALLY FOUR TIMES DAILY. 60 g 1  . polyethylene glycol (MIRALAX / GLYCOLAX) packet Take 17 g by mouth daily as needed for moderate constipation.    . triamcinolone cream (KENALOG) 0.1 % Apply 1 application topically 2 (two) times daily. 80 g 0  . Vitamin D, Ergocalciferol, (DRISDOL) 50000 units CAPS capsule TAKE 1  CAPSULE EVERY 7 DAYS 12 capsule 0  . warfarin (COUMADIN) 5 MG tablet TAKE AS DIRECTED BY COUMADIN CLINIC 90 tablet 0   No current facility-administered medications on file prior to visit.    No Known Allergies Family History  Problem Relation Age of Onset  . Diabetes Daughter   . Colon cancer Maternal Grandfather    Social History   Socioeconomic History  . Marital status: Married    Spouse name: Not on file  . Number of children: 2  . Years of education: 56  . Highest education level: Not on file  Occupational History    Employer: RETIRED  Social Needs  . Financial resource strain: Not on file  . Food insecurity:    Worry: Not on file    Inability: Not on file  . Transportation needs:    Medical: Not on file    Non-medical: Not on file  Tobacco Use  . Smoking status: Former Smoker     Types: Cigarettes    Last attempt to quit: 09/26/2007    Years since quitting: 9.9  . Smokeless tobacco: Never Used  Substance and Sexual Activity  . Alcohol use: No  . Drug use: No  . Sexual activity: Never  Lifestyle  . Physical activity:    Days per week: Not on file    Minutes per session: Not on file  . Stress: Not on file  Relationships  . Social connections:    Talks on phone: Not on file    Gets together: Not on file    Attends religious service: Not on file    Active member of club or organization: Not on file    Attends meetings of clubs or organizations: Not on file    Relationship status: Not on file  Other Topics Concern  . Not on file  Social History Narrative  . Not on file   Depression screen [facility] 2/9 08/28/2017 08/07/2017 01/12/2017 04/16/2016 04/05/2016  Decreased Interest 0 0 0 0 0  Down, Depressed, Hopeless 0 0 0 0 0  PHQ - 2 Score 0 0 0 0 0     Review of Systems  Constitutional: Negative for activity change, appetite change, chills, diaphoresis, fatigue, fever and unexpected weight change.  Cardiovascular: Positive for leg swelling.  Musculoskeletal: Positive for arthralgias, gait problem, joint swelling and myalgias.       Objective:   Physical Exam  Constitutional: She is oriented to person, place, and time. She appears well-developed and well-nourished. No distress.  HENT:  Head: Normocephalic and atraumatic.  Right Ear: External ear normal.  Eyes: Conjunctivae are normal. No scleral icterus.  Pulmonary/Chest: Effort normal.  Neurological: She is alert and oriented to person, place, and time.  Skin: Skin is warm and dry. She is not diaphoretic. No erythema.  Psychiatric: She has a normal mood and affect. Her behavior is normal.         BP 118/70   Pulse 68   Temp 98 F (36.7 C) (Oral)   Resp 16   Ht 5\' 6"  (1.676 m)   Wt 124 lb (56.2 kg)   SpO2 96%   BMI 20.01 kg/m   Assessment & Plan:   1. Hemiplegia of dominant side as late effect  following cerebrovascular disease ([place])   2. Contracture of joint - needs new right wrist splint - CMA contacted [facility] but it has actually been about 2 yrs since they made brace - Alanzo was surprised - he still would like  a more durable product like they received from a company immed after her CVA that lasted for many years - advise contacted [facility] to see if they have different materials that can be utilized for a more durable product that were used for her prior and if they do not then send to other local prosthetic/personalized orthostic manufacturers - the brace shoppe???  3. Gait instability - at 08/07/17 OV printed pt paper rx from [place] for 16 in manual wheelchair for pt to take to [place]. Pt is non-ambulatory due to CVA - she can transfer with minimal assist and take a few steps with support of a person or walker but cannot walk >50 ft Misc. Devices [facility]) MISC     Sig: 1 Units by Does not apply route as needed. Reason: I69.959, R53.1, R26.81, M21.371, M24.50    Dispense:  1 each    Refill:  0     4. Acquired right foot drop    BP still looking great since stopping lisinopril 10 3 wks ago so will leave off ofr now though perhaps acei was indicated as EF was 10-15% at time of CVA though cardiology later thought that was secondary to the sz and cva rather than CAD and likely to make good recovery with no cardiology f/u needed - may want to consider restarting pt on 2.5mg  lisinopril . . ? Delman Cheadle, M.D.  Primary Care at [facility] [address] [place], [place] [address] [phone] phone [phone] fax  09/18/17 1:31 AM

## 2017-09-18 NOTE — Telephone Encounter (Addendum)
Please check my box, complete the form appropriately, and bring to me for sig. Wheelchair was discussed and ordered in [place] in 08/07/17 OV Thanks.  There are also telephone messages on 8/20 and 8/27 asking our office to call both [place] to provider more info - the #s are listed.  My copied message: Please call [employer] see what is needed - I THINK that husband reported that they needed a 43 in wheelchair (ok to be a pediatric version) rather than the standard 18 in as that can't fit through the doorways in their house.  Pt is non-ambulatory due to CVA - she can transfer with minimal assist and walk short distances.  It was for a reg push wheelchair - not electrical or motorized. Marland Kitchen Marland Kitchen

## 2017-09-18 NOTE — Telephone Encounter (Addendum)
Please call [employer] see what is needed - I THINK that husband reported that they needed a 64 in wheelchair (ok to be a pediatric version) rather than the standard 18 in as that can't fit through the doorways in their house.  Pt is non-ambulatory due to CVA - she can transfer with minimal assist and walk short distances.  It was for a reg push wheelchair - not electrical or motorized. . .  Wheelchair was discussed and ordered in [place] in 08/07/17 OV

## 2017-09-20 NOTE — Telephone Encounter (Signed)
Angie from [facility] calling about the RX for the 16 inch wheel chair they stating that they had re faxed a form for additional information on 09-02-17 to be done please call Levada Dy from [facility] at [phone]

## 2017-09-30 ENCOUNTER — Other Ambulatory Visit: Payer: Self-pay

## 2017-09-30 MED ORDER — WHEELCHAIR MISC: 1.0000 [IU] | 0 refills | Status: DC | PRN

## 2017-09-30 NOTE — Telephone Encounter (Signed)
Called to follow-up with pt LVM to call the office with anymore questions or concerns.

## 2017-10-02 DIAGNOSIS — I6789 Other cerebrovascular disease: Secondary | ICD-10-CM | POA: Diagnosis not present

## 2017-10-02 DIAGNOSIS — R1310 Dysphagia, unspecified: Secondary | ICD-10-CM | POA: Diagnosis not present

## 2017-10-02 DIAGNOSIS — I69959 Hemiplegia and hemiparesis following unspecified cerebrovascular disease affecting unspecified side: Secondary | ICD-10-CM | POA: Diagnosis not present

## 2017-10-02 DIAGNOSIS — R2689 Other abnormalities of gait and mobility: Secondary | ICD-10-CM | POA: Diagnosis not present

## 2017-10-02 DIAGNOSIS — R2681 Unsteadiness on feet: Secondary | ICD-10-CM | POA: Diagnosis not present

## 2017-10-02 DIAGNOSIS — R531 Weakness: Secondary | ICD-10-CM | POA: Diagnosis not present

## 2017-10-09 NOTE — Telephone Encounter (Signed)
Spoke with pt husband and he informed me that all information was received and they will be picking up her wheel chair tomorrow. He states he will give [place] a call if he has any more concerns.

## 2017-10-14 ENCOUNTER — Other Ambulatory Visit: Payer: Self-pay | Admitting: Family Medicine

## 2017-10-14 NOTE — Telephone Encounter (Signed)
Vitamin D refill Last Refill:07/01/17 # 12 cap No RF Last OV: 04/05/16 that addresses med PCP: Delman Cheadle MD [place]

## 2017-10-24 DIAGNOSIS — Z7901 Long term (current) use of anticoagulants: Secondary | ICD-10-CM | POA: Diagnosis not present

## 2017-10-24 DIAGNOSIS — I639 Cerebral infarction, unspecified: Secondary | ICD-10-CM | POA: Diagnosis not present

## 2017-11-01 DIAGNOSIS — R2689 Other abnormalities of gait and mobility: Secondary | ICD-10-CM | POA: Diagnosis not present

## 2017-11-01 DIAGNOSIS — I6789 Other cerebrovascular disease: Secondary | ICD-10-CM | POA: Diagnosis not present

## 2017-11-01 DIAGNOSIS — R531 Weakness: Secondary | ICD-10-CM | POA: Diagnosis not present

## 2017-11-01 DIAGNOSIS — R1310 Dysphagia, unspecified: Secondary | ICD-10-CM | POA: Diagnosis not present

## 2017-11-01 DIAGNOSIS — I69959 Hemiplegia and hemiparesis following unspecified cerebrovascular disease affecting unspecified side: Secondary | ICD-10-CM | POA: Diagnosis not present

## 2017-11-01 DIAGNOSIS — R2681 Unsteadiness on feet: Secondary | ICD-10-CM | POA: Diagnosis not present

## 2017-12-02 DIAGNOSIS — R2689 Other abnormalities of gait and mobility: Secondary | ICD-10-CM | POA: Diagnosis not present

## 2017-12-02 DIAGNOSIS — I6789 Other cerebrovascular disease: Secondary | ICD-10-CM | POA: Diagnosis not present

## 2017-12-02 DIAGNOSIS — R2681 Unsteadiness on feet: Secondary | ICD-10-CM | POA: Diagnosis not present

## 2017-12-02 DIAGNOSIS — I69959 Hemiplegia and hemiparesis following unspecified cerebrovascular disease affecting unspecified side: Secondary | ICD-10-CM | POA: Diagnosis not present

## 2017-12-02 DIAGNOSIS — R1310 Dysphagia, unspecified: Secondary | ICD-10-CM | POA: Diagnosis not present

## 2017-12-02 DIAGNOSIS — R531 Weakness: Secondary | ICD-10-CM | POA: Diagnosis not present

## 2017-12-10 DIAGNOSIS — I639 Cerebral infarction, unspecified: Secondary | ICD-10-CM | POA: Diagnosis not present

## 2017-12-10 DIAGNOSIS — Z7901 Long term (current) use of anticoagulants: Secondary | ICD-10-CM | POA: Diagnosis not present

## 2018-01-01 DIAGNOSIS — R531 Weakness: Secondary | ICD-10-CM | POA: Diagnosis not present

## 2018-01-01 DIAGNOSIS — R1310 Dysphagia, unspecified: Secondary | ICD-10-CM | POA: Diagnosis not present

## 2018-01-01 DIAGNOSIS — I6789 Other cerebrovascular disease: Secondary | ICD-10-CM | POA: Diagnosis not present

## 2018-01-01 DIAGNOSIS — R2689 Other abnormalities of gait and mobility: Secondary | ICD-10-CM | POA: Diagnosis not present

## 2018-01-01 DIAGNOSIS — R2681 Unsteadiness on feet: Secondary | ICD-10-CM | POA: Diagnosis not present

## 2018-01-01 DIAGNOSIS — I69959 Hemiplegia and hemiparesis following unspecified cerebrovascular disease affecting unspecified side: Secondary | ICD-10-CM | POA: Diagnosis not present

## 2018-01-14 ENCOUNTER — Other Ambulatory Visit: Payer: Self-pay | Admitting: Family Medicine

## 2018-01-14 NOTE — Telephone Encounter (Signed)
Requested medication (s) are due for refill today: yes  Requested medication (s) are on the active medication list: yes  Last refill:  10/15/17  Future visit scheduled: no  Notes to clinic:  Med not delegated.  Requested Prescriptions  Pending Prescriptions Disp Refills   Vitamin D, Ergocalciferol, (DRISDOL) 1.25 MG (50000 UT) CAPS capsule [Pharmacy Med Name: VITAMIN D 1.25 MG (50000 UT) Capsule] 12 capsule 0    Sig: TAKE 1 CAPSULE EVERY 7 DAYS     Endocrinology:  Vitamins - Vitamin D Supplementation Failed - 01/14/2018  3:26 PM      Failed - 50,000 IU strengths are not delegated      Failed - Phosphate in normal range and within 360 days    No results found for: PHOS       Failed - Vit D in normal range and within 360 days    Vit D, 25-Hydroxy  Date Value Ref Range Status  04/05/2016 23.8 (L) 30.0 - 100.0 ng/mL Final    Comment:    Vitamin D deficiency has been defined by the Institute of Medicine and an Endocrine Society practice guideline as a level of serum 25-OH vitamin D less than 20 ng/mL (1,2). The Endocrine Society went on to further define vitamin D insufficiency as a level between 21 and 29 ng/mL (2). 1. IOM (Institute of Medicine). 2010. Dietary reference    intakes for calcium and D. Jet: The    [employer]. 2. Holick MF, Binkley [place], Bischoff-Ferrari HA, et al.    Evaluation, treatment, and prevention of vitamin D    deficiency: an Endocrine Society clinical practice    guideline. JCEM. 2011 Jul; 96(7):1911-30.          Passed - Ca in normal range and within 360 days    Calcium  Date Value Ref Range Status  08/07/2017 9.3 8.7 - 10.3 mg/dL Final         Passed - Valid encounter within last 12 months    Recent Outpatient Visits          4 months ago Hemiplegia of dominant side as late effect following cerebrovascular disease ([place])   [facility] at [facility], Laurey Arrow, MD   5 months ago Contracture of joint   [facility] at  [facility], Laurey Arrow, MD   1 year ago Essential hypertension   [facility] at [facility], Laurey Arrow, MD   1 year ago Medicare annual wellness visit, subsequent   [facility] at [facility], Laurey Arrow, MD   1 year ago Essential hypertension   [facility] at [facility], Laurey Arrow, MD

## 2018-01-21 DIAGNOSIS — Z7901 Long term (current) use of anticoagulants: Secondary | ICD-10-CM | POA: Diagnosis not present

## 2018-01-21 DIAGNOSIS — I639 Cerebral infarction, unspecified: Secondary | ICD-10-CM | POA: Diagnosis not present

## 2018-02-01 DIAGNOSIS — R2689 Other abnormalities of gait and mobility: Secondary | ICD-10-CM | POA: Diagnosis not present

## 2018-02-01 DIAGNOSIS — R1310 Dysphagia, unspecified: Secondary | ICD-10-CM | POA: Diagnosis not present

## 2018-02-01 DIAGNOSIS — R2681 Unsteadiness on feet: Secondary | ICD-10-CM | POA: Diagnosis not present

## 2018-02-01 DIAGNOSIS — I69959 Hemiplegia and hemiparesis following unspecified cerebrovascular disease affecting unspecified side: Secondary | ICD-10-CM | POA: Diagnosis not present

## 2018-02-01 DIAGNOSIS — R531 Weakness: Secondary | ICD-10-CM | POA: Diagnosis not present

## 2018-02-01 DIAGNOSIS — I6789 Other cerebrovascular disease: Secondary | ICD-10-CM | POA: Diagnosis not present

## 2018-02-25 ENCOUNTER — Other Ambulatory Visit: Payer: Self-pay | Admitting: Family Medicine

## 2018-02-25 DIAGNOSIS — I10 Essential (primary) hypertension: Secondary | ICD-10-CM

## 2018-02-25 DIAGNOSIS — E559 Vitamin D deficiency, unspecified: Secondary | ICD-10-CM

## 2018-02-25 DIAGNOSIS — E782 Mixed hyperlipidemia: Secondary | ICD-10-CM

## 2018-02-25 DIAGNOSIS — Z79899 Other long term (current) drug therapy: Secondary | ICD-10-CM

## 2018-02-25 DIAGNOSIS — Z7901 Long term (current) use of anticoagulants: Secondary | ICD-10-CM

## 2018-02-25 NOTE — Telephone Encounter (Signed)
Requested medication (s) are due for refill today: yes  Requested medication (s) are on the active medication list: yes    Last refill:      Keppra and coumadin not delegated    Lipitor failed protocol   Future visit scheduled no  Notes to clinic: Pt needs appt for Coreg and Fosamax refills  Requested Prescriptions  Pending Prescriptions Disp Refills   levETIRAcetam (KEPPRA) 500 MG tablet [Pharmacy Med Name: LEVETIRACETAM 500 MG Tablet] 180 tablet     Sig: TAKE 1 TABLET TWICE DAILY     Not Delegated - Neurology:  Anticonvulsants Failed - 02/25/2018  5:42 PM      Failed - This refill cannot be delegated      Passed - HCT in normal range and within 360 days    HCT, POC  Date Value Ref Range Status  08/07/2017 42.3 37.7 - 47.9 % Final   Hematocrit  Date Value Ref Range Status  04/05/2016 39.8 34.0 - 46.6 % Final         Passed - HGB in normal range and within 360 days    Hemoglobin  Date Value Ref Range Status  08/07/2017 12.5 12.2 - 16.2 g/dL Final  04/05/2016 13.4 11.1 - 15.9 g/dL Final         Passed - PLT in normal range and within 360 days    Platelets  Date Value Ref Range Status  04/05/2016 341 150 - 379 x10E3/uL Final   Platelet Count, POC  Date Value Ref Range Status  08/07/2017 241 142 - 424 K/uL Final         Passed - WBC in normal range and within 360 days    WBC  Date Value Ref Range Status  08/07/2017 7.7 4.6 - 10.2 K/uL Final  10/23/2013 10.5 4.0 - 10.5 K/uL Final         Passed - Valid encounter within last 12 months    Recent Outpatient Visits          6 months ago Hemiplegia of dominant side as late effect following cerebrovascular disease ([place])   [facility], Laurey Arrow, MD   6 months ago Contracture of joint   [facility], Laurey Arrow, MD   1 year ago Essential hypertension   [facility], Laurey Arrow, MD   1 year ago Medicare annual wellness visit, subsequent   [facility], Laurey Arrow, MD   1 year  ago Essential hypertension   [facility], Laurey Arrow, MD            carvedilol (COREG) 6.25 MG tablet [Pharmacy Med Name: CARVEDILOL 6.25 MG Tablet] 180 tablet     Sig: TAKE 1 TABLET TWICE DAILY     Cardiovascular:  Beta Blockers Failed - 02/25/2018  5:42 PM      Failed - Valid encounter within last 6 months    Recent Outpatient Visits          6 months ago Hemiplegia of dominant side as late effect following cerebrovascular disease ([place])   [facility], Laurey Arrow, MD   6 months ago Contracture of joint   [facility], Laurey Arrow, MD   1 year ago Essential hypertension   [facility], Laurey Arrow, MD   1 year ago Medicare annual wellness visit, subsequent   [facility], Laurey Arrow, MD  1 year ago Essential hypertension   [facility], MD             Passed - Last BP in normal range    BP Readings from Last 1 Encounters:  08/28/17 118/70         Passed - Last Heart Rate in normal range    Pulse Readings from Last 1 Encounters:  08/28/17 68        alendronate (FOSAMAX) 70 MG tablet [Pharmacy Med Name: ALENDRONATE SODIUM 70 MG Tablet] 12 tablet     Sig: TAKE 1 TABLET EVERY 7 DAYS WITH FULL GLASS OF WATER ON AN EMPTY STOMACH     Endocrinology:  Bisphosphonates Failed - 02/25/2018  5:42 PM      Failed - Vitamin D in normal range and within 360 days    Vit D, 25-Hydroxy  Date Value Ref Range Status  04/05/2016 23.8 (L) 30.0 - 100.0 ng/mL Final    Comment:    Vitamin D deficiency has been defined by the Institute of Medicine and an Endocrine Society practice guideline as a level of serum 25-OH vitamin D less than 20 ng/mL (1,2). The Endocrine Society went on to further define vitamin D insufficiency as a level between 21 and 29 ng/mL (2). 1. IOM (Institute of Medicine). 2010. Dietary reference    intakes for calcium and D. [place]: The    [employer]. 2. Holick MF, [place]inkley [place],  Bischoff-Ferrari HA, et al.    Evaluation, treatment, and prevention of vitamin D    deficiency: an Endocrine Society clinical practice    guideline. JCEM. 2011 Jul; 96(7):1911-30.          Passed - Ca in normal range and within 360 days    Calcium  Date Value Ref Range Status  08/07/2017 9.3 8.7 - 10.3 mg/dL Final         Passed - Valid encounter within last 12 months    Recent Outpatient Visits          6 months ago Hemiplegia of dominant side as late effect following cerebrovascular disease ([place])   [facility], Laurey Arrow, MD   6 months ago Contracture of joint   [facility], Laurey Arrow, MD   1 year ago Essential hypertension   [facility], Laurey Arrow, MD   1 year ago Medicare annual wellness visit, subsequent   [facility], Laurey Arrow, MD   1 year ago Essential hypertension   [facility], Laurey Arrow, MD            warfarin (COUMADIN) 5 MG tablet [Pharmacy Med Name: WARFARIN SODIUM 5 MG Tablet] 90 tablet 1    Sig: TAKE 1 TABLET (5 MG TOTAL) BY MOUTH DAILY. TAKE AS DIRECTED BY COUMADIN CLINIC     Hematology:  Anticoagulants - warfarin Failed - 02/25/2018  5:42 PM      Failed - This refill cannot be delegated      Failed - If the patient is managed by Coumadin Clinic - route to their Pool. If not, forward to the provider.      Failed - INR in normal range and within 30 days    INR  Date Value Ref Range Status  08/07/2017 2.3 (H) 0.8 - 1.2 Final    Comment:    Reference interval is for non-anticoagulated patients. Suggested INR therapeutic range for Vitamin  K antagonist therapy:    Standard Dose (moderate intensity                   therapeutic range):       2.0 - 3.0    Higher intensity therapeutic range       2.5 - 3.5          Failed - Valid encounter within last 3 months    Recent Outpatient Visits          6 months ago Hemiplegia of dominant side as late effect following cerebrovascular disease ([place])    [facility], Laurey Arrow, MD   6 months ago Contracture of joint   [facility], Laurey Arrow, MD   1 year ago Essential hypertension   [facility], Laurey Arrow, MD   1 year ago Medicare annual wellness visit, subsequent   [facility], Laurey Arrow, MD   1 year ago Essential hypertension   [facility], Laurey Arrow, MD            atorvastatin (LIPITOR) 80 MG tablet [Pharmacy Med Name: ATORVASTATIN CALCIUM 80 MG Tablet] 90 tablet     Sig: TAKE 1 TABLET AT BEDTIME     Cardiovascular:  Antilipid - Statins Failed - 02/25/2018  5:42 PM      Failed - Total Cholesterol in normal range and within 360 days    Cholesterol, Total  Date Value Ref Range Status  01/12/2017 121 100 - 199 mg/dL Final         Failed - LDL in normal range and within 360 days    LDL Calculated  Date Value Ref Range Status  01/12/2017 61 0 - 99 mg/dL Final         Failed - HDL in normal range and within 360 days    HDL  Date Value Ref Range Status  01/12/2017 39 (L) >39 mg/dL Final         Failed - Triglycerides in normal range and within 360 days    Triglycerides  Date Value Ref Range Status  01/12/2017 107 0 - 149 mg/dL Final         Passed - Patient is not pregnant      Passed - Valid encounter within last 12 months    Recent Outpatient Visits          6 months ago Hemiplegia of dominant side as late effect following cerebrovascular disease ([place])   [facility], Laurey Arrow, MD   6 months ago Contracture of joint   [facility], Laurey Arrow, MD   1 year ago Essential hypertension   [facility], Laurey Arrow, MD   1 year ago Medicare annual wellness visit, subsequent   [facility], Laurey Arrow, MD   1 year ago Essential hypertension   [facility], Laurey Arrow, MD

## 2018-03-04 DIAGNOSIS — R2689 Other abnormalities of gait and mobility: Secondary | ICD-10-CM | POA: Diagnosis not present

## 2018-03-04 DIAGNOSIS — R2681 Unsteadiness on feet: Secondary | ICD-10-CM | POA: Diagnosis not present

## 2018-03-04 DIAGNOSIS — Z7901 Long term (current) use of anticoagulants: Secondary | ICD-10-CM | POA: Diagnosis not present

## 2018-03-04 DIAGNOSIS — I639 Cerebral infarction, unspecified: Secondary | ICD-10-CM | POA: Diagnosis not present

## 2018-03-04 DIAGNOSIS — I6789 Other cerebrovascular disease: Secondary | ICD-10-CM | POA: Diagnosis not present

## 2018-03-04 DIAGNOSIS — R1310 Dysphagia, unspecified: Secondary | ICD-10-CM | POA: Diagnosis not present

## 2018-03-04 DIAGNOSIS — R531 Weakness: Secondary | ICD-10-CM | POA: Diagnosis not present

## 2018-03-04 DIAGNOSIS — I69959 Hemiplegia and hemiparesis following unspecified cerebrovascular disease affecting unspecified side: Secondary | ICD-10-CM | POA: Diagnosis not present

## 2018-03-04 NOTE — Telephone Encounter (Signed)
Due for office visit with FASTING labs - sent in 90d refill (or longer) to bridge pt to appointment - can schedule this with whoever they choose for their new PCP since I am leaving the [facility] in April 2020.  If they want one last appointment with me to get 6-12 mos of refills so thy have more time to find new PCP to switch to, rec scheduling appt for March as my last day in clinic may be early April.  Also, need to know if pt is having her INR/blood thinner followed at cardiology coumadin clinic or where?  If so, she should have them rx her coumadin and also have them send [place] a copy of the note from her most recent and future visits.  If not, please come in asap for FASTING lab only visit and then can discuss lab results and any further refills/changes/issues at her next OV.

## 2018-03-04 NOTE — Telephone Encounter (Signed)
Please advise on refill of medications. Pt was last seen on 08/28/17 and I do not see a future appointment

## 2018-04-01 ENCOUNTER — Other Ambulatory Visit: Payer: Self-pay | Admitting: Family Medicine

## 2018-04-01 NOTE — Telephone Encounter (Signed)
Vitamin D 50,000 units refill request  Was pt of Dr. Brigitte Pulse.   No future appts noted.   Not established with another provider.

## 2018-04-02 DIAGNOSIS — R2689 Other abnormalities of gait and mobility: Secondary | ICD-10-CM | POA: Diagnosis not present

## 2018-04-02 DIAGNOSIS — I6789 Other cerebrovascular disease: Secondary | ICD-10-CM | POA: Diagnosis not present

## 2018-04-02 DIAGNOSIS — I69959 Hemiplegia and hemiparesis following unspecified cerebrovascular disease affecting unspecified side: Secondary | ICD-10-CM | POA: Diagnosis not present

## 2018-04-02 DIAGNOSIS — R531 Weakness: Secondary | ICD-10-CM | POA: Diagnosis not present

## 2018-05-03 DIAGNOSIS — R531 Weakness: Secondary | ICD-10-CM | POA: Diagnosis not present

## 2018-05-03 DIAGNOSIS — R2689 Other abnormalities of gait and mobility: Secondary | ICD-10-CM | POA: Diagnosis not present

## 2018-05-03 DIAGNOSIS — I69959 Hemiplegia and hemiparesis following unspecified cerebrovascular disease affecting unspecified side: Secondary | ICD-10-CM | POA: Diagnosis not present

## 2018-05-03 DIAGNOSIS — I6789 Other cerebrovascular disease: Secondary | ICD-10-CM | POA: Diagnosis not present

## 2018-05-07 DIAGNOSIS — I639 Cerebral infarction, unspecified: Secondary | ICD-10-CM | POA: Diagnosis not present

## 2018-05-07 DIAGNOSIS — Z7901 Long term (current) use of anticoagulants: Secondary | ICD-10-CM | POA: Diagnosis not present

## 2018-05-21 ENCOUNTER — Telehealth: Payer: Self-pay | Admitting: Family Medicine

## 2018-05-21 NOTE — Telephone Encounter (Signed)
[employer] called wanting Dr. Pamella Pert to do a retro- refferal for a wheelchair. Pt has a TOC appointment on 06/09/18 with SantiagoThe first one was improperly handled. [employer] would like a CB at [phone] concerning this issue.

## 2018-05-22 NOTE — Telephone Encounter (Signed)
Spoke with [employer] at - [phone] they stated didn't know what was needed Called patients husband he states he is getting a bill from [facility] for the wheelchair, and a form needs to be filled .   Waiting on fax from [facility] to fill out.

## 2018-05-22 NOTE — Telephone Encounter (Signed)
Please investigate, thanks

## 2018-05-22 NOTE — Telephone Encounter (Signed)
Please see note below. 

## 2018-05-27 ENCOUNTER — Other Ambulatory Visit: Payer: Self-pay | Admitting: *Deleted

## 2018-05-27 NOTE — Telephone Encounter (Signed)
Spoke with Mcarthur Rossetti again they will be faxing another form.

## 2018-05-28 ENCOUNTER — Telehealth: Payer: Self-pay | Admitting: Family Medicine

## 2018-05-28 NOTE — Telephone Encounter (Signed)
Copied from [place] [id number]. Topic: Quick Communication - Rx Refill/Question >> May 28, 2018  5:22 PM Nils Flack, Marland Kitchen wrote: Medication: baclofen (LIORESAL) 20 MG tablet, digoxin (LANOXIN) 0.125 MG tablet, warfarin (COUMADIN) 5 MG tablet  Has the patient contacted their pharmacy? Yes.   (Agent: If no, request that the patient contact the pharmacy for the refill.) (Agent: If yes, when and what did the pharmacy advise?)  Preferred Pharmacy (with phone number or street name): [facility]  They said that they faxed over request on 04/29 also   Agent: Please be advised that RX refills may take up to 3 business days. We ask that you follow-up with your pharmacy.

## 2018-05-29 NOTE — Telephone Encounter (Signed)
Spoke with [employer] for a 3rd time they are stated will fax correct forms.  they were given both fax numbers on three different occasions.   Fax numbers were confirmed with representatives never received forms. [name] states they don't know what we are calling for, after speaking with patient he states they are getting a bill and he was told  a retro-referral form was needed .   Checked [employer] website no form to print off.

## 2018-06-02 ENCOUNTER — Telehealth: Payer: Self-pay | Admitting: Family Medicine

## 2018-06-02 DIAGNOSIS — I6789 Other cerebrovascular disease: Secondary | ICD-10-CM | POA: Diagnosis not present

## 2018-06-02 DIAGNOSIS — R531 Weakness: Secondary | ICD-10-CM | POA: Diagnosis not present

## 2018-06-02 DIAGNOSIS — R2689 Other abnormalities of gait and mobility: Secondary | ICD-10-CM | POA: Diagnosis not present

## 2018-06-02 DIAGNOSIS — I69959 Hemiplegia and hemiparesis following unspecified cerebrovascular disease affecting unspecified side: Secondary | ICD-10-CM | POA: Diagnosis not present

## 2018-06-02 NOTE — Telephone Encounter (Signed)
Pt called in for a medication refill. Pt called pharmacy and they sent a fax with the needed prescriptions.

## 2018-06-05 NOTE — Telephone Encounter (Signed)
Pt has ov scheduled with santiago on 06/09/2018 and these refill request can be addressed at that time.

## 2018-06-09 ENCOUNTER — Telehealth (INDEPENDENT_AMBULATORY_CARE_PROVIDER_SITE_OTHER): Payer: Medicare HMO | Admitting: Family Medicine

## 2018-06-09 ENCOUNTER — Other Ambulatory Visit: Payer: Self-pay

## 2018-06-09 DIAGNOSIS — E559 Vitamin D deficiency, unspecified: Secondary | ICD-10-CM

## 2018-06-09 DIAGNOSIS — Z7901 Long term (current) use of anticoagulants: Secondary | ICD-10-CM

## 2018-06-09 DIAGNOSIS — I10 Essential (primary) hypertension: Secondary | ICD-10-CM

## 2018-06-09 DIAGNOSIS — I69959 Hemiplegia and hemiparesis following unspecified cerebrovascular disease affecting unspecified side: Secondary | ICD-10-CM

## 2018-06-09 DIAGNOSIS — Z5181 Encounter for therapeutic drug level monitoring: Secondary | ICD-10-CM

## 2018-06-09 DIAGNOSIS — I69322 Dysarthria following cerebral infarction: Secondary | ICD-10-CM

## 2018-06-09 DIAGNOSIS — E782 Mixed hyperlipidemia: Secondary | ICD-10-CM

## 2018-06-09 MED ORDER — BACLOFEN 20 MG PO TABS: ORAL_TABLET | ORAL | 2 refills | Status: DC

## 2018-06-09 MED ORDER — DIGOXIN 125 MCG PO TABS: 125.0000 ug | ORAL_TABLET | Freq: Every day | ORAL | 0 refills | Status: DC

## 2018-06-09 NOTE — Progress Notes (Signed)
Spoke with pt husband "[name]", pt is needing refills on pended medication

## 2018-06-09 NOTE — Progress Notes (Signed)
Virtual Visit Note  I connected with patient on 06/09/18 at 1205pm by phone and verified that I am speaking with the correct person using two identifiers. [name] is currently located at home and patient and her husband is currently with them during visit. The provider, Rutherford Guys, MD is located in their office at time of visit.  I discussed the limitations, risks, security and privacy concerns of performing an evaluation and management service by telephone and the availability of in person appointments. I also discussed with the patient that there may be a patient responsible charge related to this service. The patient expressed understanding and agreed to proceed.   CC: routine followup  HPI ? History provided by husband as patient answers only yes and no  Last OV with Dr Brigitte Pulse in July 1610 Stroke, embolic, 10 years ago Dr Sabra Heck at [facility] , checks INR every 6 weeks Reports that his wife has been doing fine Patient has right hand contractures and has right foot drop She is able to walk with a 4 pronged arm cane, uses left arm Has good appetite, denies any dysphagia He reports mild comprehension issues He reports that she overall has good control of bladder or bowels He reports she sleeps well and has not noticed any changes in her mood He reports any complaints of CP, SOB, or edema Has upcoming appt with dentist as she needs teeth pulled They do not check BP at home Has not had seizure in years   No Known Allergies  Prior to Admission medications   Medication Sig Start Date End Date Taking? Authorizing Provider  alendronate (FOSAMAX) 70 MG tablet TAKE 1 TABLET EVERY 7 DAYS WITH FULL GLASS OF WATER ON AN EMPTY STOMACH 03/04/18   [name], MD  atorvastatin (LIPITOR) 80 MG tablet TAKE 1 TABLET AT BEDTIME 03/04/18   [name], MD  baclofen (LIORESAL) 20 MG tablet TAKE 1 TABLET THREE TIMES DAILY (MAY TAKE AN ADDITIONAL TABLET MID-DAY OR BEFORE BED AS NEEDED  FOR MUSCLE SPASMS) 09/09/17   [name], MD  carvedilol (COREG) 6.25 MG tablet TAKE 1 TABLET TWICE DAILY 03/04/18   [name], MD  digoxin (LANOXIN) 0.125 MG tablet TAKE 1 TABLET EVERY DAY 06/09/17   [name], MD  docusate sodium (COLACE) 50 MG capsule Take 1 capsule (50 mg total) by mouth daily. 03/26/14   [name], MD  hydrocortisone (ANUSOL-HC) 25 MG suppository Place 1 suppository (25 mg total) rectally 2 (two) times daily as needed for hemorrhoids or itching. 04/16/16   [name], MD  levETIRAcetam (KEPPRA) 500 MG tablet TAKE 1 TABLET TWICE DAILY 03/04/18   [name], MD  Liniments (HEET TRIPLE ACTION EX) Apply 1 application topically as needed (for pain).    [provider]  Misc. Devices [facility]) MISC 1 Units by Does not apply route as needed. Reason: I69.959, Marylen Ponto, M21.371, M24.50 09/30/17   [name], MD  nystatin (MYCOSTATIN/NYSTOP) powder APPLY TOPICALLY FOUR TIMES DAILY. 04/22/17   [name], MD  polyethylene glycol [facility] / Floria Raveling) packet Take 17 g by mouth daily as needed for moderate constipation.    [provider]  triamcinolone cream (KENALOG) 0.1 % Apply 1 application topically 2 (two) times daily. 03/26/14   [name], MD  Vitamin D, Ergocalciferol, (DRISDOL) 1.25 MG (50000 UT) CAPS capsule TAKE 1 CAPSULE EVERY 7 DAYS 04/03/18   Forrest Moron, MD  warfarin (  COUMADIN) 5 MG tablet TAKE AS DIRECTED BY COUMADIN CLINIC. NEED OFFICE VISIT WITH FASTING LABS FOR REFILLS 03/04/18   [name], MD    Past Medical History:  Diagnosis Date  . Adenomatous polyp of colon 09/2012   repeat colonoscopy in 5 years by Dr. Sharlett Iles  . CHF (congestive heart failure) (HCC)    EF 15-20% as of 05/28/11 (Dr [facility] summary)  . CVA (cerebral infarction)   . Hemiparesis ([place])   . Hyperlipidemia   . Hypertension   . Seizures ([place])   . Sickle cell anemia (HCC)   . Stroke ([place])   . Vitamin D deficiency 2010    Past Surgical History:   Procedure Laterality Date  . ABDOMINAL HYSTERECTOMY    . FRACTURE SURGERY    . HIP SURGERY    . TUBAL LIGATION      Social History   Tobacco Use  . Smoking status: Former Smoker    Types: Cigarettes    Last attempt to quit: 09/26/2007    Years since quitting: 10.7  . Smokeless tobacco: Never Used  Substance Use Topics  . Alcohol use: No    Family History  Problem Relation Age of Onset  . Diabetes Daughter   . Colon cancer Maternal Grandfather     ROS Per hpi  Objective  Vitals as reported by the patient: none   ASSESSMENT and PLAN  1. Hemiplegia of dominant side as late effect following cerebrovascular disease ([place])  2. Essential hypertension  3. Vitamin D deficiency - VITAMIN D 25 Hydroxy (Vit-D Deficiency, Fractures); Future  4. Mixed hyperlipidemia - Comprehensive metabolic panel; Future - Lipid panel; Future  5. Dysarthria as late effect of cerebrovascular accident (CVA)  6. Anticoagulated on Coumadin - CBC; Future  7. Medication monitoring encounter - Digoxin level; Future  Patients overall health and well being is stable. Husband has no concerns. INR and warfarin managed by coumadin clinic. Labs as above.   Other orders - baclofen (LIORESAL) 20 MG tablet; TAKE 1 TABLET THREE TIMES DAILY (MAY TAKE AN ADDITIONAL TABLET MID-DAY OR BEFORE BED AS NEEDED FOR MUSCLE SPASMS) - digoxin (LANOXIN) 0.125 MG tablet; Take 1 tablet (125 mcg total) by mouth daily.  FOLLOW-UP: labs within 1 week, followup in 4 months   The above assessment and management plan was discussed with the patient. The patient verbalized understanding of and has agreed to the management plan. Patient is aware to call the clinic if symptoms persist or worsen. Patient is aware when to return to the clinic for a follow-up visit. Patient educated on when it is appropriate to go to the emergency department.    I provided 18 minutes of non-face-to-face time during this encounter.  Rutherford Guys, MD [facility] [place], [place] [address] Ph.  [phone] Fax [phone]

## 2018-06-11 DIAGNOSIS — Z7901 Long term (current) use of anticoagulants: Secondary | ICD-10-CM | POA: Diagnosis not present

## 2018-06-11 DIAGNOSIS — I639 Cerebral infarction, unspecified: Secondary | ICD-10-CM | POA: Diagnosis not present

## 2018-06-25 ENCOUNTER — Other Ambulatory Visit: Payer: Self-pay

## 2018-06-25 ENCOUNTER — Ambulatory Visit (INDEPENDENT_AMBULATORY_CARE_PROVIDER_SITE_OTHER): Payer: Medicare HMO | Admitting: Family Medicine

## 2018-06-25 DIAGNOSIS — E559 Vitamin D deficiency, unspecified: Secondary | ICD-10-CM | POA: Diagnosis not present

## 2018-06-25 DIAGNOSIS — Z7901 Long term (current) use of anticoagulants: Secondary | ICD-10-CM

## 2018-06-25 DIAGNOSIS — E782 Mixed hyperlipidemia: Secondary | ICD-10-CM

## 2018-06-25 DIAGNOSIS — Z5181 Encounter for therapeutic drug level monitoring: Secondary | ICD-10-CM | POA: Diagnosis not present

## 2018-06-25 DIAGNOSIS — M816 Localized osteoporosis [Lequesne]: Secondary | ICD-10-CM

## 2018-06-25 LAB — LIPID PANEL

## 2018-06-25 MED ORDER — WARFARIN SODIUM 5 MG PO TABS: ORAL_TABLET | ORAL | 0 refills | Status: DC

## 2018-06-25 MED ORDER — VITAMIN D (ERGOCALCIFEROL) 1.25 MG (50000 UNIT) PO CAPS: ORAL_CAPSULE | ORAL | 0 refills | Status: DC

## 2018-06-26 LAB — COMPREHENSIVE METABOLIC PANEL
ALT: 27 IU/L (ref 0–32)
AST: 34 IU/L (ref 0–40)
Albumin/Globulin Ratio: 1.4 (ref 1.2–2.2)
Albumin: 4.5 g/dL (ref 3.8–4.8)
Alkaline Phosphatase: 146 IU/L — ABNORMAL HIGH (ref 39–117)
BUN/Creatinine Ratio: 14 (ref 12–28)
BUN: 12 mg/dL (ref 8–27)
Bilirubin Total: 0.4 mg/dL (ref 0.0–1.2)
CO2: 18 mmol/L — ABNORMAL LOW (ref 20–29)
Calcium: 9.3 mg/dL (ref 8.7–10.3)
Chloride: 102 mmol/L (ref 96–106)
Creatinine, Ser: 0.83 mg/dL (ref 0.57–1.00)
GFR calc Af Amer: 83 mL/min/{1.73_m2} (ref >59)
GFR calc non Af Amer: 72 mL/min/{1.73_m2} (ref >59)
Globulin, Total: 3.3 g/dL (ref 1.5–4.5)
Glucose: 91 mg/dL (ref 65–99)
Potassium: 4.5 mmol/L (ref 3.5–5.2)
Sodium: 140 mmol/L (ref 134–144)
Total Protein: 7.8 g/dL (ref 6.0–8.5)

## 2018-06-26 LAB — LIPID PANEL
Chol/HDL Ratio: 3.2 ratio (ref 0.0–4.4)
Cholesterol, Total: 134 mg/dL (ref 100–199)
HDL: 42 mg/dL (ref >39)
LDL Calculated: 71 mg/dL (ref 0–99)
Triglycerides: 103 mg/dL (ref 0–149)
VLDL Cholesterol Cal: 21 mg/dL (ref 5–40)

## 2018-06-26 LAB — CBC
Hematocrit: 40.3 % (ref 34.0–46.6)
Hemoglobin: 13.5 g/dL (ref 11.1–15.9)
MCH: 28.1 pg (ref 26.6–33.0)
MCHC: 33.5 g/dL (ref 31.5–35.7)
MCV: 84 fL (ref 79–97)
Platelets: 313 10*3/uL (ref 150–450)
RBC: 4.8 x10E6/uL (ref 3.77–5.28)
RDW: 13.3 % (ref 11.7–15.4)
WBC: 8.8 10*3/uL (ref 3.4–10.8)

## 2018-06-26 LAB — VITAMIN D 25 HYDROXY (VIT D DEFICIENCY, FRACTURES): Vit D, 25-Hydroxy: 41.1 ng/mL (ref 30.0–100.0)

## 2018-06-26 LAB — DIGOXIN LEVEL: Digoxin, Serum: 1.4 ng/mL — ABNORMAL HIGH (ref 0.5–0.9)

## 2018-06-30 ENCOUNTER — Ambulatory Visit: Payer: Medicare HMO

## 2018-06-30 ENCOUNTER — Other Ambulatory Visit: Payer: Self-pay | Admitting: Family Medicine

## 2018-06-30 DIAGNOSIS — Z8673 Personal history of transient ischemic attack (TIA), and cerebral infarction without residual deficits: Secondary | ICD-10-CM

## 2018-06-30 DIAGNOSIS — I429 Cardiomyopathy, unspecified: Secondary | ICD-10-CM

## 2018-06-30 DIAGNOSIS — Z5181 Encounter for therapeutic drug level monitoring: Secondary | ICD-10-CM

## 2018-06-30 MED ORDER — DIGOXIN 125 MCG PO TABS: 0.0625 mg | ORAL_TABLET | Freq: Every day | ORAL | 0 refills | Status: DC

## 2018-07-03 DIAGNOSIS — I69959 Hemiplegia and hemiparesis following unspecified cerebrovascular disease affecting unspecified side: Secondary | ICD-10-CM | POA: Diagnosis not present

## 2018-07-03 DIAGNOSIS — I6789 Other cerebrovascular disease: Secondary | ICD-10-CM | POA: Diagnosis not present

## 2018-07-03 DIAGNOSIS — R2689 Other abnormalities of gait and mobility: Secondary | ICD-10-CM | POA: Diagnosis not present

## 2018-07-03 DIAGNOSIS — R531 Weakness: Secondary | ICD-10-CM | POA: Diagnosis not present

## 2018-07-31 DIAGNOSIS — Z7901 Long term (current) use of anticoagulants: Secondary | ICD-10-CM | POA: Diagnosis not present

## 2018-07-31 DIAGNOSIS — I639 Cerebral infarction, unspecified: Secondary | ICD-10-CM | POA: Diagnosis not present

## 2018-08-02 DIAGNOSIS — I69959 Hemiplegia and hemiparesis following unspecified cerebrovascular disease affecting unspecified side: Secondary | ICD-10-CM | POA: Diagnosis not present

## 2018-08-02 DIAGNOSIS — I6789 Other cerebrovascular disease: Secondary | ICD-10-CM | POA: Diagnosis not present

## 2018-08-02 DIAGNOSIS — R531 Weakness: Secondary | ICD-10-CM | POA: Diagnosis not present

## 2018-08-02 DIAGNOSIS — R2689 Other abnormalities of gait and mobility: Secondary | ICD-10-CM | POA: Diagnosis not present

## 2018-08-04 ENCOUNTER — Other Ambulatory Visit: Payer: Self-pay

## 2018-08-04 ENCOUNTER — Ambulatory Visit (INDEPENDENT_AMBULATORY_CARE_PROVIDER_SITE_OTHER): Payer: Medicare HMO | Admitting: Family Medicine

## 2018-08-04 DIAGNOSIS — I429 Cardiomyopathy, unspecified: Secondary | ICD-10-CM | POA: Diagnosis not present

## 2018-08-04 DIAGNOSIS — Z8673 Personal history of transient ischemic attack (TIA), and cerebral infarction without residual deficits: Secondary | ICD-10-CM | POA: Diagnosis not present

## 2018-08-04 DIAGNOSIS — Z5181 Encounter for therapeutic drug level monitoring: Secondary | ICD-10-CM | POA: Diagnosis not present

## 2018-08-05 LAB — DIGOXIN LEVEL: Digoxin, Serum: 0.7 ng/mL (ref 0.5–0.9)

## 2018-09-01 ENCOUNTER — Other Ambulatory Visit: Payer: Self-pay | Admitting: Family Medicine

## 2018-09-01 DIAGNOSIS — Z7901 Long term (current) use of anticoagulants: Secondary | ICD-10-CM

## 2018-09-01 NOTE — Telephone Encounter (Signed)
Please advise 

## 2018-09-02 DIAGNOSIS — I6789 Other cerebrovascular disease: Secondary | ICD-10-CM | POA: Diagnosis not present

## 2018-09-02 DIAGNOSIS — I69959 Hemiplegia and hemiparesis following unspecified cerebrovascular disease affecting unspecified side: Secondary | ICD-10-CM | POA: Diagnosis not present

## 2018-09-02 DIAGNOSIS — R2689 Other abnormalities of gait and mobility: Secondary | ICD-10-CM | POA: Diagnosis not present

## 2018-09-02 DIAGNOSIS — R531 Weakness: Secondary | ICD-10-CM | POA: Diagnosis not present

## 2018-09-03 ENCOUNTER — Other Ambulatory Visit: Payer: Self-pay | Admitting: Family Medicine

## 2018-09-03 DIAGNOSIS — M816 Localized osteoporosis [Lequesne]: Secondary | ICD-10-CM

## 2018-09-03 NOTE — Telephone Encounter (Signed)
Forwarding medication refill request to the clinical pool for review. 

## 2018-09-04 ENCOUNTER — Other Ambulatory Visit: Payer: Self-pay | Admitting: Family Medicine

## 2018-09-04 NOTE — Telephone Encounter (Signed)
Pt request refill  atorvastatin (LIPITOR) 80 MG tablet carvedilol (COREG) 6.25 MG tablet levETIRAcetam (KEPPRA) 500 MG tablet  90 day  [place], [place] [phone] (Phone) [phone] (Fax)

## 2018-09-04 NOTE — Telephone Encounter (Signed)
Will Dr. Pamella Pert be taking over these rxs?

## 2018-09-10 MED ORDER — CARVEDILOL 6.25 MG PO TABS: 6.2500 mg | ORAL_TABLET | Freq: Two times a day (BID) | ORAL | 0 refills | Status: DC

## 2018-09-10 MED ORDER — LEVETIRACETAM 500 MG PO TABS: 500.0000 mg | ORAL_TABLET | Freq: Two times a day (BID) | ORAL | 1 refills | Status: DC

## 2018-09-10 MED ORDER — ATORVASTATIN CALCIUM 80 MG PO TABS: 80.0000 mg | ORAL_TABLET | Freq: Every day | ORAL | 1 refills | Status: DC

## 2018-09-12 ENCOUNTER — Emergency Department (HOSPITAL_COMMUNITY): Payer: Medicare HMO

## 2018-09-12 ENCOUNTER — Inpatient Hospital Stay (HOSPITAL_COMMUNITY)
Admission: EM | Admit: 2018-09-12 | Discharge: 2018-09-20 | DRG: 100 | Disposition: A | Discharge status: Skilled Nursing Facility | Payer: Medicare HMO | Attending: Internal Medicine | Admitting: Internal Medicine

## 2018-09-12 DIAGNOSIS — N179 Acute kidney failure, unspecified: Secondary | ICD-10-CM | POA: Diagnosis present

## 2018-09-12 DIAGNOSIS — Z9911 Dependence on respirator [ventilator] status: Secondary | ICD-10-CM | POA: Diagnosis not present

## 2018-09-12 DIAGNOSIS — E875 Hyperkalemia: Secondary | ICD-10-CM | POA: Diagnosis present

## 2018-09-12 DIAGNOSIS — T4276XA Underdosing of unspecified antiepileptic and sedative-hypnotic drugs, initial encounter: Secondary | ICD-10-CM | POA: Diagnosis present

## 2018-09-12 DIAGNOSIS — E559 Vitamin D deficiency, unspecified: Secondary | ICD-10-CM | POA: Diagnosis present

## 2018-09-12 DIAGNOSIS — G40119 Localization-related (focal) (partial) symptomatic epilepsy and epileptic syndromes with simple partial seizures, intractable, without status epilepticus: Secondary | ICD-10-CM | POA: Diagnosis not present

## 2018-09-12 DIAGNOSIS — E872 Acidosis: Secondary | ICD-10-CM | POA: Diagnosis present

## 2018-09-12 DIAGNOSIS — Z800 Family history of malignant neoplasm of digestive organs: Secondary | ICD-10-CM

## 2018-09-12 DIAGNOSIS — I10 Essential (primary) hypertension: Secondary | ICD-10-CM | POA: Diagnosis not present

## 2018-09-12 DIAGNOSIS — R0902 Hypoxemia: Secondary | ICD-10-CM | POA: Diagnosis not present

## 2018-09-12 DIAGNOSIS — Z7901 Long term (current) use of anticoagulants: Secondary | ICD-10-CM

## 2018-09-12 DIAGNOSIS — G40101 Localization-related (focal) (partial) symptomatic epilepsy and epileptic syndromes with simple partial seizures, not intractable, with status epilepticus: Principal | ICD-10-CM | POA: Diagnosis present

## 2018-09-12 DIAGNOSIS — R2681 Unsteadiness on feet: Secondary | ICD-10-CM | POA: Diagnosis not present

## 2018-09-12 DIAGNOSIS — I351 Nonrheumatic aortic (valve) insufficiency: Secondary | ICD-10-CM | POA: Diagnosis present

## 2018-09-12 DIAGNOSIS — I248 Other forms of acute ischemic heart disease: Secondary | ICD-10-CM | POA: Diagnosis not present

## 2018-09-12 DIAGNOSIS — Z87891 Personal history of nicotine dependence: Secondary | ICD-10-CM

## 2018-09-12 DIAGNOSIS — Z79899 Other long term (current) drug therapy: Secondary | ICD-10-CM

## 2018-09-12 DIAGNOSIS — E162 Hypoglycemia, unspecified: Secondary | ICD-10-CM | POA: Diagnosis present

## 2018-09-12 DIAGNOSIS — R4020 Unspecified coma: Secondary | ICD-10-CM | POA: Diagnosis not present

## 2018-09-12 DIAGNOSIS — Z9851 Tubal ligation status: Secondary | ICD-10-CM

## 2018-09-12 DIAGNOSIS — E785 Hyperlipidemia, unspecified: Secondary | ICD-10-CM | POA: Diagnosis present

## 2018-09-12 DIAGNOSIS — I4510 Unspecified right bundle-branch block: Secondary | ICD-10-CM | POA: Diagnosis present

## 2018-09-12 DIAGNOSIS — I69051 Hemiplegia and hemiparesis following nontraumatic subarachnoid hemorrhage affecting right dominant side: Secondary | ICD-10-CM | POA: Diagnosis not present

## 2018-09-12 DIAGNOSIS — Z91128 Patient's intentional underdosing of medication regimen for other reason: Secondary | ICD-10-CM

## 2018-09-12 DIAGNOSIS — R2689 Other abnormalities of gait and mobility: Secondary | ICD-10-CM | POA: Diagnosis not present

## 2018-09-12 DIAGNOSIS — Z9114 Patient's other noncompliance with medication regimen: Secondary | ICD-10-CM | POA: Diagnosis not present

## 2018-09-12 DIAGNOSIS — I110 Hypertensive heart disease with heart failure: Secondary | ICD-10-CM | POA: Diagnosis present

## 2018-09-12 DIAGNOSIS — I2489 Other forms of acute ischemic heart disease: Secondary | ICD-10-CM

## 2018-09-12 DIAGNOSIS — Z7401 Bed confinement status: Secondary | ICD-10-CM | POA: Diagnosis not present

## 2018-09-12 DIAGNOSIS — I5042 Chronic combined systolic (congestive) and diastolic (congestive) heart failure: Secondary | ICD-10-CM

## 2018-09-12 DIAGNOSIS — J9600 Acute respiratory failure, unspecified whether with hypoxia or hypercapnia: Secondary | ICD-10-CM | POA: Diagnosis not present

## 2018-09-12 DIAGNOSIS — M6281 Muscle weakness (generalized): Secondary | ICD-10-CM | POA: Diagnosis not present

## 2018-09-12 DIAGNOSIS — I5021 Acute systolic (congestive) heart failure: Secondary | ICD-10-CM | POA: Diagnosis not present

## 2018-09-12 DIAGNOSIS — G40901 Epilepsy, unspecified, not intractable, with status epilepticus: Secondary | ICD-10-CM | POA: Diagnosis not present

## 2018-09-12 DIAGNOSIS — R1310 Dysphagia, unspecified: Secondary | ICD-10-CM | POA: Diagnosis not present

## 2018-09-12 DIAGNOSIS — M21371 Foot drop, right foot: Secondary | ICD-10-CM | POA: Diagnosis present

## 2018-09-12 DIAGNOSIS — R0602 Shortness of breath: Secondary | ICD-10-CM | POA: Diagnosis not present

## 2018-09-12 DIAGNOSIS — Z20828 Contact with and (suspected) exposure to other viral communicable diseases: Secondary | ICD-10-CM | POA: Diagnosis not present

## 2018-09-12 DIAGNOSIS — G40909 Epilepsy, unspecified, not intractable, without status epilepticus: Secondary | ICD-10-CM | POA: Diagnosis not present

## 2018-09-12 DIAGNOSIS — S0990XA Unspecified injury of head, initial encounter: Secondary | ICD-10-CM | POA: Diagnosis not present

## 2018-09-12 DIAGNOSIS — I5041 Acute combined systolic (congestive) and diastolic (congestive) heart failure: Secondary | ICD-10-CM

## 2018-09-12 DIAGNOSIS — S199XXA Unspecified injury of neck, initial encounter: Secondary | ICD-10-CM | POA: Diagnosis not present

## 2018-09-12 DIAGNOSIS — D571 Sickle-cell disease without crisis: Secondary | ICD-10-CM | POA: Diagnosis present

## 2018-09-12 DIAGNOSIS — E876 Hypokalemia: Secondary | ICD-10-CM | POA: Diagnosis present

## 2018-09-12 DIAGNOSIS — J9601 Acute respiratory failure with hypoxia: Secondary | ICD-10-CM | POA: Diagnosis not present

## 2018-09-12 DIAGNOSIS — R1312 Dysphagia, oropharyngeal phase: Secondary | ICD-10-CM | POA: Diagnosis not present

## 2018-09-12 DIAGNOSIS — I213 ST elevation (STEMI) myocardial infarction of unspecified site: Secondary | ICD-10-CM | POA: Diagnosis not present

## 2018-09-12 DIAGNOSIS — R569 Unspecified convulsions: Secondary | ICD-10-CM | POA: Diagnosis not present

## 2018-09-12 DIAGNOSIS — I69322 Dysarthria following cerebral infarction: Secondary | ICD-10-CM

## 2018-09-12 DIAGNOSIS — Y92009 Unspecified place in unspecified non-institutional (private) residence as the place of occurrence of the external cause: Secondary | ICD-10-CM | POA: Diagnosis not present

## 2018-09-12 DIAGNOSIS — I5043 Acute on chronic combined systolic (congestive) and diastolic (congestive) heart failure: Secondary | ICD-10-CM | POA: Diagnosis not present

## 2018-09-12 DIAGNOSIS — R404 Transient alteration of awareness: Secondary | ICD-10-CM | POA: Diagnosis not present

## 2018-09-12 DIAGNOSIS — I69351 Hemiplegia and hemiparesis following cerebral infarction affecting right dominant side: Secondary | ICD-10-CM

## 2018-09-12 DIAGNOSIS — M24541 Contracture, right hand: Secondary | ICD-10-CM | POA: Diagnosis not present

## 2018-09-12 DIAGNOSIS — J9690 Respiratory failure, unspecified, unspecified whether with hypoxia or hypercapnia: Secondary | ICD-10-CM | POA: Diagnosis not present

## 2018-09-12 DIAGNOSIS — I429 Cardiomyopathy, unspecified: Secondary | ICD-10-CM | POA: Diagnosis present

## 2018-09-12 DIAGNOSIS — M810 Age-related osteoporosis without current pathological fracture: Secondary | ICD-10-CM | POA: Diagnosis present

## 2018-09-12 DIAGNOSIS — Z90710 Acquired absence of both cervix and uterus: Secondary | ICD-10-CM

## 2018-09-12 DIAGNOSIS — R41841 Cognitive communication deficit: Secondary | ICD-10-CM | POA: Diagnosis not present

## 2018-09-12 DIAGNOSIS — R278 Other lack of coordination: Secondary | ICD-10-CM | POA: Diagnosis not present

## 2018-09-12 DIAGNOSIS — M2550 Pain in unspecified joint: Secondary | ICD-10-CM | POA: Diagnosis not present

## 2018-09-12 DIAGNOSIS — I499 Cardiac arrhythmia, unspecified: Secondary | ICD-10-CM | POA: Diagnosis not present

## 2018-09-12 LAB — COMPREHENSIVE METABOLIC PANEL
ALT: 21 U/L (ref 0–44)
AST: 62 U/L — ABNORMAL HIGH (ref 15–41)
Albumin: 3.9 g/dL (ref 3.5–5.0)
Alkaline Phosphatase: 115 U/L (ref 38–126)
Anion gap: 20 — ABNORMAL HIGH (ref 5–15)
BUN: 17 mg/dL (ref 8–23)
CO2: 13 mmol/L — ABNORMAL LOW (ref 22–32)
Calcium: 8.7 mg/dL — ABNORMAL LOW (ref 8.9–10.3)
Chloride: 104 mmol/L (ref 98–111)
Creatinine, Ser: 1.28 mg/dL — ABNORMAL HIGH (ref 0.44–1.00)
GFR calc Af Amer: 49 mL/min — ABNORMAL LOW (ref >60)
GFR calc non Af Amer: 43 mL/min — ABNORMAL LOW (ref >60)
Glucose, Bld: 256 mg/dL — ABNORMAL HIGH (ref 70–99)
Potassium: 5.2 mmol/L — ABNORMAL HIGH (ref 3.5–5.1)
Sodium: 137 mmol/L (ref 135–145)
Total Bilirubin: 1 mg/dL (ref 0.3–1.2)
Total Protein: 7.5 g/dL (ref 6.5–8.1)

## 2018-09-12 LAB — CBC WITH DIFFERENTIAL/PLATELET
Abs Immature Granulocytes: 0 10*3/uL (ref 0.00–0.07)
Basophils Absolute: 0.2 10*3/uL — ABNORMAL HIGH (ref 0.0–0.1)
Basophils Relative: 1 %
Eosinophils Absolute: 0 10*3/uL (ref 0.0–0.5)
Eosinophils Relative: 0 %
HCT: 47.1 % — ABNORMAL HIGH (ref 36.0–46.0)
Hemoglobin: 14 g/dL (ref 12.0–15.0)
Lymphocytes Relative: 56 %
Lymphs Abs: 10.2 10*3/uL — ABNORMAL HIGH (ref 0.7–4.0)
MCH: 27.6 pg (ref 26.0–34.0)
MCHC: 29.7 g/dL — ABNORMAL LOW (ref 30.0–36.0)
MCV: 92.7 fL (ref 80.0–100.0)
Monocytes Absolute: 0.9 10*3/uL (ref 0.1–1.0)
Monocytes Relative: 5 %
Neutro Abs: 7 10*3/uL (ref 1.7–7.7)
Neutrophils Relative %: 38 %
Platelets: 385 10*3/uL (ref 150–400)
RBC: 5.08 MIL/uL (ref 3.87–5.11)
RDW: 13.5 % (ref 11.5–15.5)
WBC: 18.3 10*3/uL — ABNORMAL HIGH (ref 4.0–10.5)
nRBC: 0 % (ref 0.0–0.2)
nRBC: 0 /100 WBC

## 2018-09-12 LAB — POCT I-STAT 7, (LYTES, BLD GAS, ICA,H+H)
Acid-base deficit: 7 mmol/L — ABNORMAL HIGH (ref 0.0–2.0)
Bicarbonate: 20.5 mmol/L (ref 20.0–28.0)
Calcium, Ion: 1.15 mmol/L (ref 1.15–1.40)
HCT: 38 % (ref 36.0–46.0)
Hemoglobin: 12.9 g/dL (ref 12.0–15.0)
O2 Saturation: 99 %
Patient temperature: 97.9
Potassium: 3.3 mmol/L — ABNORMAL LOW (ref 3.5–5.1)
Sodium: 140 mmol/L (ref 135–145)
TCO2: 22 mmol/L (ref 22–32)
pCO2 arterial: 47.8 mmHg (ref 32.0–48.0)
pH, Arterial: 7.239 — ABNORMAL LOW (ref 7.350–7.450)
pO2, Arterial: 141 mmHg — ABNORMAL HIGH (ref 83.0–108.0)

## 2018-09-12 LAB — URINALYSIS, ROUTINE W REFLEX MICROSCOPIC
Bilirubin Urine: NEGATIVE
Glucose, UA: 50 mg/dL — AB
Hgb urine dipstick: NEGATIVE
Ketones, ur: NEGATIVE mg/dL
Leukocytes,Ua: NEGATIVE
Nitrite: NEGATIVE
Protein, ur: >=300 mg/dL — AB
Specific Gravity, Urine: 1.014 (ref 1.005–1.030)
pH: 6 (ref 5.0–8.0)

## 2018-09-12 LAB — MAGNESIUM: Magnesium: 2 mg/dL (ref 1.7–2.4)

## 2018-09-12 LAB — TROPONIN I (HIGH SENSITIVITY): Troponin I (High Sensitivity): 68 ng/L — ABNORMAL HIGH (ref <18)

## 2018-09-12 LAB — LACTIC ACID, PLASMA: Lactic Acid, Venous: 9.4 mmol/L (ref 0.5–1.9)

## 2018-09-12 LAB — RAPID URINE DRUG SCREEN, HOSP PERFORMED
Amphetamines: NOT DETECTED
Barbiturates: NOT DETECTED
Benzodiazepines: POSITIVE — AB
Cocaine: NOT DETECTED
Opiates: NOT DETECTED
Tetrahydrocannabinol: NOT DETECTED

## 2018-09-12 LAB — PROTIME-INR
INR: 1.6 — ABNORMAL HIGH (ref 0.8–1.2)
Prothrombin Time: 19 seconds — ABNORMAL HIGH (ref 11.4–15.2)

## 2018-09-12 LAB — SARS CORONAVIRUS 2 BY RT PCR (HOSPITAL ORDER, PERFORMED IN ~~LOC~~ HOSPITAL LAB): SARS Coronavirus 2: NEGATIVE

## 2018-09-12 LAB — CBG MONITORING, ED: Glucose-Capillary: 264 mg/dL — ABNORMAL HIGH (ref 70–99)

## 2018-09-12 LAB — ETHANOL: Alcohol, Ethyl (B): <10 mg/dL (ref <10)

## 2018-09-12 MED ORDER — PANTOPRAZOLE SODIUM 40 MG IV SOLR
40.0000 mg | Freq: Every day | INTRAVENOUS | Status: DC
  Administered 2018-09-12 – 2018-09-15 (×4): 40 mg via INTRAVENOUS
  Filled 2018-09-12 (×4): qty 40

## 2018-09-12 MED ORDER — ONDANSETRON HCL 4 MG/2ML IJ SOLN: 4.0000 mg | Freq: Four times a day (QID) | INTRAMUSCULAR | Status: DC | PRN

## 2018-09-12 MED ORDER — LEVETIRACETAM IN NACL 1000 MG/100ML IV SOLN
1000.0000 mg | Freq: Once | INTRAVENOUS | Status: AC
  Administered 2018-09-12: 1000 mg via INTRAVENOUS

## 2018-09-12 MED ORDER — PANTOPRAZOLE SODIUM 40 MG IV SOLR: 40.0000 mg | Freq: Every day | INTRAVENOUS | Status: DC

## 2018-09-12 MED ORDER — LEVETIRACETAM IN NACL 1000 MG/100ML IV SOLN
1000.0000 mg | Freq: Two times a day (BID) | INTRAVENOUS | Status: DC
  Filled 2018-09-12: qty 100

## 2018-09-12 MED ORDER — SODIUM CHLORIDE 0.9 % IV SOLN
1.0000 g | Freq: Once | INTRAVENOUS | Status: AC
  Administered 2018-09-12: 1 g via INTRAVENOUS
  Filled 2018-09-12: qty 1

## 2018-09-12 MED ORDER — VANCOMYCIN HCL IN DEXTROSE 1-5 GM/200ML-% IV SOLN
1000.0000 mg | Freq: Once | INTRAVENOUS | Status: AC
  Administered 2018-09-13: 1000 mg via INTRAVENOUS
  Filled 2018-09-12: qty 200

## 2018-09-12 MED ORDER — FENTANYL CITRATE (PF) 100 MCG/2ML IJ SOLN: 25.0000 ug | INTRAMUSCULAR | Status: DC | PRN

## 2018-09-12 MED ORDER — INSULIN ASPART 100 UNIT/ML ~~LOC~~ SOLN
0.0000 [IU] | SUBCUTANEOUS | Status: DC
  Administered 2018-09-13 – 2018-09-15 (×5): 2 [IU] via SUBCUTANEOUS
  Administered 2018-09-16: 12:00:00 3 [IU] via SUBCUTANEOUS
  Administered 2018-09-16: 16:00:00 2 [IU] via SUBCUTANEOUS
  Administered 2018-09-17 – 2018-09-19 (×3): 3 [IU] via SUBCUTANEOUS

## 2018-09-12 MED ORDER — ETOMIDATE 2 MG/ML IV SOLN
INTRAVENOUS | Status: AC | PRN
  Administered 2018-09-12: 20 mg via INTRAVENOUS

## 2018-09-12 MED ORDER — FENTANYL CITRATE (PF) 100 MCG/2ML IJ SOLN
25.0000 ug | INTRAMUSCULAR | Status: DC | PRN
  Administered 2018-09-12: 50 ug via INTRAVENOUS
  Filled 2018-09-12: qty 2

## 2018-09-12 MED ORDER — ROCURONIUM BROMIDE 50 MG/5ML IV SOLN
INTRAVENOUS | Status: AC | PRN
  Administered 2018-09-12: 70 mg via INTRAVENOUS

## 2018-09-12 MED ORDER — PROPOFOL 1000 MG/100ML IV EMUL
0.0000 ug/kg/min | INTRAVENOUS | Status: DC
  Administered 2018-09-12: 5 ug/kg/min via INTRAVENOUS
  Administered 2018-09-13: 10 ug/kg/min via INTRAVENOUS
  Filled 2018-09-12 (×2): qty 100

## 2018-09-12 MED ORDER — ACETAMINOPHEN 325 MG PO TABS: 650.0000 mg | ORAL_TABLET | ORAL | Status: DC | PRN

## 2018-09-12 MED ORDER — CHLORHEXIDINE GLUCONATE CLOTH 2 % EX PADS
6.0000 | MEDICATED_PAD | Freq: Every day | CUTANEOUS | Status: DC
  Administered 2018-09-13 – 2018-09-19 (×7): 6 via TOPICAL

## 2018-09-12 NOTE — ED Notes (Signed)
ED TO INPATIENT HANDOFF REPORT  ED Nurse Name and Phone #: [phone], [place] RN  S Name/Age/Gender [name] 68 y.o. female Room/Bed: TRACC/TRACC  Code Status   Code Status: Full Code  Home/SNF/Other Home Patient is intubated and sedated. Is this baseline? No   Triage Complete: Triage complete  Chief Complaint STEMI; Seizures  Triage Note Pt arrived from home by EMS as code stemi and seizure. Husband heard pt fall, fire found pt lying on the floor. Hx of seizure and stroke, but per EMS has been a while since last seizures; pt has R sided paralysis and aphasia from previous stroke.  Husband reports known well 1700. EMS reported two seizures enroute, received 2.5mg  versed. EKG showed elevation in inferior leads, STEMI activated enroute. c-collar in place. Pt has not taken coumadin, carvedilol, or torvastatin in 3 days.  EMS VS 158/107, pulse 131, CBG 174 22g L C and 20g L wrist   Allergies No Known Allergies  Level of Care/Admitting Diagnosis ED Disposition    ED Disposition Condition [facility] Area: [place] [[id number]]  Level of Care: ICU [6]  Covid Evaluation: Person Under Investigation (PUI)  Diagnosis: Seizure disorder [facility]) [id number]  Admitting Physician: Shellia Cleverly [id number]  Attending Physician: Shellia Cleverly [phone]  Estimated length of stay: past midnight tomorrow  Certification:: I certify this patient will need inpatient services for at least 2 midnights  PT Class (Do Not Modify): Inpatient [101]  PT Acc Code (Do Not Modify): Private [1]       B Medical/Surgery History Past Medical History:  Diagnosis Date  . Adenomatous polyp of colon 09/2012   repeat colonoscopy in 5 years by Dr. Sharlett Iles  . CHF (congestive heart failure) (HCC)    EF 15-20% as of 05/28/11 (Dr Legrand [facility] D[facility] summary)  . CVA (cerebral infarction)   . Hemiparesis ([place])   . Hyperlipidemia   . Hypertension   . Seizures  ([place])   . Sickle cell anemia (HCC)   . Stroke ([place])   . Vitamin D deficiency 2010   Past Surgical History:  Procedure Laterality Date  . ABDOMINAL HYSTERECTOMY    . FRACTURE SURGERY    . HIP SURGERY    . TUBAL LIGATION       A IV Location/Drains/Wounds Patient Lines/Drains/Airways Status   Active Line/Drains/Airways    Name:   Placement date:   Placement time:   Site:   Days:   Peripheral IV 05/29/11 Left Hand   05/29/11    0013    Hand   2663   Peripheral IV 09/12/18 Anterior;Left;Proximal Forearm   09/12/18    2022    Forearm   less than 1   Peripheral IV 09/12/18 Anterior;Left Wrist   09/12/18    2022    Wrist   less than 1   NG/OG Tube Orogastric 16 Fr. Center mouth Xray   09/12/18    2033    Center mouth   less than 1   Urethral Catheter [place] O    09/12/18    2130    -   less than 1   Airway 7.5 mm   09/12/18    2027     less than 1   Airway 7.5 mm   09/12/18    2030     less than 1          Intake/Output Last 24 hours No intake or output data in the 24 hours  ending 09/12/18 2320  Labs/Imaging Results for orders placed or performed during the hospital encounter of 09/12/18 (from the past 48 hour(s))  CBC with Differential     Status: Abnormal   Collection Time: 09/12/18  8:22 PM  Result Value Ref Range   WBC 18.3 (H) 4.0 - 10.5 K/uL   RBC 5.08 3.87 - 5.11 MIL/uL   Hemoglobin 14.0 12.0 - 15.0 g/dL   HCT 47.1 (H) 36.0 - 46.0 %   MCV 92.7 80.0 - 100.0 fL   MCH 27.6 26.0 - 34.0 pg   MCHC 29.7 (L) 30.0 - 36.0 g/dL   RDW 13.5 11.5 - 15.5 %   Platelets 385 150 - 400 K/uL   nRBC 0.0 0.0 - 0.2 %   Neutrophils Relative % 38 %   Neutro Abs 7.0 1.7 - 7.7 K/uL   Lymphocytes Relative 56 %   Lymphs Abs 10.2 (H) 0.7 - 4.0 K/uL   Monocytes Relative 5 %   Monocytes Absolute 0.9 0.1 - 1.0 K/uL   Eosinophils Relative 0 %   Eosinophils Absolute 0.0 0.0 - 0.5 K/uL   Basophils Relative 1 %   Basophils Absolute 0.2 (H) 0.0 - 0.1 K/uL   Smear Review Acanthocytes present     nRBC 0 0 /100 WBC   Abs Immature Granulocytes 0.00 0.00 - 0.07 K/uL    Comment: Performed at [facility], [address], [place], [place] [address]  Comprehensive metabolic panel     Status: Abnormal   Collection Time: 09/12/18  8:22 PM  Result Value Ref Range   Sodium 137 135 - 145 mmol/L   Potassium 5.2 (H) 3.5 - 5.1 mmol/L   Chloride 104 98 - 111 mmol/L   CO2 13 (L) 22 - 32 mmol/L   Glucose, Bld 256 (H) 70 - 99 mg/dL   BUN 17 8 - 23 mg/dL   Creatinine, Ser 1.28 (H) 0.44 - 1.00 mg/dL   Calcium 8.7 (L) 8.9 - 10.3 mg/dL   Total Protein 7.5 6.5 - 8.1 g/dL   Albumin 3.9 3.5 - 5.0 g/dL   AST 62 (H) 15 - 41 U/L   ALT 21 0 - 44 U/L   Alkaline Phosphatase 115 38 - 126 U/L   Total Bilirubin 1.0 0.3 - 1.2 mg/dL   GFR calc non Af Amer 43 (L) >60 mL/min   GFR calc Af Amer 49 (L) >60 mL/min   Anion gap 20 (H) 5 - 15    Comment: Performed at [facility], [place] [address] [place], [place] [address]  Magnesium     Status: None   Collection Time: 09/12/18  8:22 PM  Result Value Ref Range   Magnesium 2.0 1.7 - 2.4 mg/dL    Comment: Performed at [facility], [place] [address], [place], [place] [address]  Lactic acid, plasma     Status: Abnormal   Collection Time: 09/12/18  8:22 PM  Result Value Ref Range   Lactic Acid, Venous 9.4 (HH) 0.5 - 1.9 mmol/L    Comment: CRITICAL RESULT CALLED TO, READ BACK BY AND VERIFIED WITH: OLDLAND,B RN 09/12/2018 2152 JORDANS Performed at [facility], [place] [address] [place], [place] [address]   Ethanol     Status: None   Collection Time: 09/12/18  8:22 PM  Result Value Ref Range   Alcohol, Ethyl (B) <10 <10 mg/dL    Comment: (NOTE) Lowest detectable limit for serum alcohol is 10 mg/dL. For medical purposes  only. Performed at [facility], [place] [address] [place], [place] [address]   Protime-INR     Status: Abnormal   Collection Time: 09/12/18  8:22 PM  Result Value Ref Range   Prothrombin Time 19.0 (H) 11.4 - 15.2  seconds   INR 1.6 (H) 0.8 - 1.2    Comment: (NOTE) INR goal varies based on device and disease states. Performed at [facility], [place] [address] [place], [place] [address]   Troponin I (High Sensitivity)     Status: Abnormal   Collection Time: 09/12/18  8:22 PM  Result Value Ref Range   Troponin I (High Sensitivity) 68 (H) <18 ng/L    Comment: (NOTE) Elevated high sensitivity troponin I (hsTnI) values and significant  changes across serial measurements may suggest ACS but many other  chronic and acute conditions are known to elevate hsTnI results.  Refer to the "Links" section for chest pain algorithms and additional  guidance. Performed at [facility], [place] [address], [place], [place] [address]   CBG monitoring, ED     Status: Abnormal   Collection Time: 09/12/18  8:23 PM  Result Value Ref Range   Glucose-Capillary 264 (H) 70 - 99 mg/dL  SARS Coronavirus 2 [facility] order, Performed in [facility] hospital lab) Nasopharyngeal Nasopharyngeal Swab     Status: None   Collection Time: 09/12/18  8:44 PM   Specimen: Nasopharyngeal Swab  Result Value Ref Range   SARS Coronavirus 2 NEGATIVE NEGATIVE    Comment: (NOTE) If result is NEGATIVE SARS-CoV-2 target nucleic acids are NOT DETECTED. The SARS-CoV-2 RNA is generally detectable in upper and lower  respiratory specimens during the acute phase of infection. The lowest  concentration of SARS-CoV-2 viral copies this assay can detect is 250  copies / mL. A negative result does not preclude SARS-CoV-2 infection  and should not be used as the sole basis for treatment or other  patient management decisions.  A negative result may occur with  improper specimen collection / handling, submission of specimen other  than nasopharyngeal swab, presence of viral mutation(s) within the  areas targeted by this assay, and inadequate number of viral copies  (<250 copies / mL). A negative result must be combined with clinical   observations, patient history, and epidemiological information. If result is POSITIVE SARS-CoV-2 target nucleic acids are DETECTED. The SARS-CoV-2 RNA is generally detectable in upper and lower  respiratory specimens dur ing the acute phase of infection.  Positive  results are indicative of active infection with SARS-CoV-2.  Clinical  correlation with patient history and other diagnostic information is  necessary to determine patient infection status.  Positive results do  not rule out bacterial infection or co-infection with other viruses. If result is PRESUMPTIVE POSTIVE SARS-CoV-2 nucleic acids MAY BE PRESENT.   A presumptive positive result was obtained on the submitted specimen  and confirmed on repeat testing.  While 2019 novel coronavirus  (SARS-CoV-2) nucleic acids may be present in the submitted sample  additional confirmatory testing may be necessary for epidemiological  and / or clinical management purposes  to differentiate between  SARS-CoV-2 and other Sarbecovirus currently known to infect humans.  If clinically indicated additional testing with an alternate test  methodology [id number]) is advised. The SARS-CoV-2 RNA is generally  detectable in upper and lower respiratory sp ecimens during the acute  phase of infection. The expected result is Negative. Fact Sheet for Patients:  StrictlyIdeas.no Fact Sheet for Healthcare Providers:  BankingDealers.co.za This test is not yet approved or cleared by the [place] and has been authorized for detection and/or diagnosis of SARS-CoV-2 by FDA under an Emergency Use Authorization (EUA).  This EUA will remain in effect (meaning this test can be used) for the duration of the COVID-19 declaration under Section 564(b)(1) of the Act, 21 U.S.C. section 360bbb-3(b)(1), unless the authorization is terminated or revoked sooner. Performed at [facility], [place] [address],  [place], [place] [address]   Urinalysis, Routine w reflex microscopic     Status: Abnormal   Collection Time: 09/12/18  9:30 PM  Result Value Ref Range   Color, Urine YELLOW YELLOW   APPearance CLEAR CLEAR   Specific Gravity, Urine 1.014 1.005 - 1.030   pH 6.0 5.0 - 8.0   Glucose, UA 50 (A) NEGATIVE mg/dL   Hgb urine dipstick NEGATIVE NEGATIVE   Bilirubin Urine NEGATIVE NEGATIVE   Ketones, ur NEGATIVE NEGATIVE mg/dL   Protein, ur >=300 (A) NEGATIVE mg/dL   Nitrite NEGATIVE NEGATIVE   Leukocytes,Ua NEGATIVE NEGATIVE   RBC / HPF 0-5 0 - 5 RBC/hpf   WBC, UA 11-20 0 - 5 WBC/hpf   Bacteria, UA RARE (A) NONE SEEN   Squamous Epithelial / LPF 0-5 0 - 5   Mucus PRESENT     Comment: Performed at [facility], [place] [address], [place], [place] [address]  Rapid urine drug screen (hospital performed)     Status: Abnormal   Collection Time: 09/12/18  9:30 PM  Result Value Ref Range   Opiates NONE DETECTED NONE DETECTED   Cocaine NONE DETECTED NONE DETECTED   Benzodiazepines POSITIVE (A) NONE DETECTED   Amphetamines NONE DETECTED NONE DETECTED   Tetrahydrocannabinol NONE DETECTED NONE DETECTED   Barbiturates NONE DETECTED NONE DETECTED    Comment: (NOTE) DRUG SCREEN FOR MEDICAL PURPOSES ONLY.  IF CONFIRMATION IS NEEDED FOR ANY PURPOSE, NOTIFY LAB WITHIN 5 DAYS. LOWEST DETECTABLE LIMITS FOR URINE DRUG SCREEN Drug Class                     Cutoff (ng/mL) Amphetamine and metabolites    1000 Barbiturate and metabolites    200 Benzodiazepine                 [id number] Tricyclics and metabolites     300 Opiates and metabolites        300 Cocaine and metabolites        300 THC                            50 Performed at [facility], [place] [address], [place], [place] [address]   I-STAT 7, (LYTES, BLD GAS, ICA, H+H)     Status: Abnormal   Collection Time: 09/12/18  9:48 PM  Result Value Ref Range   pH, Arterial 7.239 (L) 7.350 - 7.450   pCO2 arterial 47.8 32.0 - 48.0 mmHg   pO2, Arterial  141.0 (H) 83.0 - 108.0 mmHg   Bicarbonate 20.5 20.0 - 28.0 mmol/L   TCO2 22 22 - 32 mmol/L   O2 Saturation 99.0 %   Acid-base deficit 7.0 (H) 0.0 - 2.0 mmol/L   Sodium 140 135 - 145 mmol/L   Potassium 3.3 (L) 3.5 - 5.1 mmol/L   Calcium, Ion 1.15 1.15 - 1.40 mmol/L   HCT 38.0 36.0 - 46.0 %   Hemoglobin 12.9 12.0 - 15.0 g/dL  Patient temperature 97.9 F    Sample type ARTERIAL    Ct Head Wo Contrast  Result Date: 09/12/2018 CLINICAL DATA:  C-spine trauma, high clinical risk, found down EXAM: CT HEAD WITHOUT CONTRAST CT CERVICAL SPINE WITHOUT CONTRAST TECHNIQUE: Multidetector CT imaging of the head and cervical spine was performed following the standard protocol without intravenous contrast. Multiplanar CT image reconstructions of the cervical spine were also generated. COMPARISON:  MR May 28, 2011 FINDINGS: CT HEAD FINDINGS Brain: Chronic encephalomalacia within the left MCA distribution similar to comparison from from 2013. No evidence of acute infarction, hemorrhage, hydrocephalus, extra-axial collection or mass lesion/mass effect. Symmetric prominence of the ventricles, cisterns and sulci compatible with parenchymal volume loss. Patchy areas of white matter hypoattenuation are most compatible with chronic microvascular angiopathy. Vascular: Atherosclerotic calcification of the carotid siphons. Skull: No calvarial fracture or suspicious osseous lesion. No scalp swelling or hematoma. Sinuses/Orbits: Paranasal sinuses and mastoid air cells are predominantly clear. Orbital structures are unremarkable. Other: None CT CERVICAL SPINE FINDINGS Alignment: Straightening of the normal cervical lordosis without traumatic listhesis. No abnormal facet widening. Normal alignment of the craniocervical and atlantoaxial articulations. Skull base and vertebrae: No acute fracture. No primary bone lesion or focal pathologic process. Soft tissues and spinal canal: No pre or paravertebral fluid or swelling. No visible canal  hematoma. Disc levels: Multilevel cervical spondylitic changes are present with calcified disc osteophyte complexes at C3-4, C4-5, C5-6 these findings result in at most mild spinal canal stenosis. Uncinate spurring results and mild right neural foraminal narrowing at C5-6. No other significant canal or foraminal stenosis within the included portions of the cervical spine. Upper chest: Some interlobular septal thickening on a background of emphysematous change. Other: Endotracheal and transesophageal tubes are noted within their respective lumens. Small amount of airways secretions noted above the inflated endotracheal balloon. Atherosclerotic plaque within the carotid bifurcations. Small amount of gas in the subclavian veins, likely iatrogenic related to intravenous access. IMPRESSION: 1. No acute intracranial abnormality. Remote left MCA distribution infarct. 2. No acute cervical spine fracture. Multilevel degenerative changes of the cervical spine as described above. 3. Endotracheal and transesophageal tubes are noted within their respective lumens. Small amount of airways secretions noted above the inflated endotracheal balloon. Electronically Signed   By: Lovena Le M.D.   On: 09/12/2018 21:14   Ct Cervical Spine Wo Contrast  Result Date: 09/12/2018 CLINICAL DATA:  C-spine trauma, high clinical risk, found down EXAM: CT HEAD WITHOUT CONTRAST CT CERVICAL SPINE WITHOUT CONTRAST TECHNIQUE: Multidetector CT imaging of the head and cervical spine was performed following the standard protocol without intravenous contrast. Multiplanar CT image reconstructions of the cervical spine were also generated. COMPARISON:  MR May 28, 2011 FINDINGS: CT HEAD FINDINGS Brain: Chronic encephalomalacia within the left MCA distribution similar to comparison from from 2013. No evidence of acute infarction, hemorrhage, hydrocephalus, extra-axial collection or mass lesion/mass effect. Symmetric prominence of the ventricles, cisterns  and sulci compatible with parenchymal volume loss. Patchy areas of white matter hypoattenuation are most compatible with chronic microvascular angiopathy. Vascular: Atherosclerotic calcification of the carotid siphons. Skull: No calvarial fracture or suspicious osseous lesion. No scalp swelling or hematoma. Sinuses/Orbits: Paranasal sinuses and mastoid air cells are predominantly clear. Orbital structures are unremarkable. Other: None CT CERVICAL SPINE FINDINGS Alignment: Straightening of the normal cervical lordosis without traumatic listhesis. No abnormal facet widening. Normal alignment of the craniocervical and atlantoaxial articulations. Skull base and vertebrae: No acute fracture. No primary bone lesion or focal pathologic process.  Soft tissues and spinal canal: No pre or paravertebral fluid or swelling. No visible canal hematoma. Disc levels: Multilevel cervical spondylitic changes are present with calcified disc osteophyte complexes at C3-4, C4-5, C5-6 these findings result in at most mild spinal canal stenosis. Uncinate spurring results and mild right neural foraminal narrowing at C5-6. No other significant canal or foraminal stenosis within the included portions of the cervical spine. Upper chest: Some interlobular septal thickening on a background of emphysematous change. Other: Endotracheal and transesophageal tubes are noted within their respective lumens. Small amount of airways secretions noted above the inflated endotracheal balloon. Atherosclerotic plaque within the carotid bifurcations. Small amount of gas in the subclavian veins, likely iatrogenic related to intravenous access. IMPRESSION: 1. No acute intracranial abnormality. Remote left MCA distribution infarct. 2. No acute cervical spine fracture. Multilevel degenerative changes of the cervical spine as described above. 3. Endotracheal and transesophageal tubes are noted within their respective lumens. Small amount of airways secretions noted  above the inflated endotracheal balloon. Electronically Signed   By: Lovena Le M.D.   On: 09/12/2018 21:14   Dg Chest Portable 1 View  Result Date: 09/12/2018 CLINICAL DATA:  Ventilator dependence. EXAM: PORTABLE CHEST 1 VIEW COMPARISON:  Earlier same day FINDINGS: Endotracheal tube tip is 1.4 cm above the base of the carina. The NG tube passes into the stomach although the distal tip position is not included on the film. The cardio pericardial silhouette is enlarged. Bibasilar atelectasis or infiltrate noted. The visualized bony structures of the thorax are intact. Insert wire IMPRESSION: Cardiomegaly with bibasilar atelectasis or infiltrate. Electronically Signed   By: Misty Stanley M.D.   On: 09/12/2018 21:23    Pending Labs Unresulted Labs (From admission, onward)    Start     Ordered   09/13/18 0500  Triglycerides  (propofol (DIPRIVAN))  Daily,   R    Comments: While on propofol (DIPRIVAN)    09/12/18 2022   09/13/18 0500  CBC  Tomorrow morning,   R     09/12/18 2231   09/13/18 0500  Magnesium  Tomorrow morning,   R     09/12/18 2231   09/13/18 0500  Phosphorus  Tomorrow morning,   R     09/12/18 2231   09/12/18 2232  Hemoglobin A1c  Once,   STAT    Comments: To assess prior glycemic control    09/12/18 2232   09/12/18 2230  Magnesium  Once,   STAT     09/12/18 2231   09/12/18 2230  Phosphorus  Once,   STAT     09/12/18 2231   09/12/18 2230  Comprehensive metabolic panel  Once,   STAT     09/12/18 2231   09/12/18 2230  Lactic acid, plasma  STAT Now then every 3 hours,   R     09/12/18 2231   09/12/18 2220  HIV antibody (Routine Testing)  Once,   STAT     09/12/18 2231   09/12/18 2152  Blood culture (routine x 2)  BLOOD CULTURE X 2,   STAT     09/12/18 2151   09/12/18 2044  Lactic acid, plasma  Now then every 2 hours,   STAT     09/12/18 2043   09/12/18 2022  Pathologist smear review  Once,   STAT     09/12/18 2022          Vitals/Pain Today's Vitals   09/12/18  2200 09/12/18 2230 09/12/18 2300 09/12/18 2315  BP: (!) 154/98 (!) 150/92 140/86 130/82  Pulse: (!) 107 (!) 102 96 88  Resp: 20 18 17 12   Temp: 97.9 F (36.6 C) 98.1 F (36.7 C) 98.4 F (36.9 C) 98.6 F (37 C)  TempSrc:      SpO2: 100% 100% 100% 100%  Weight:      Height:        Isolation Precautions No active isolations  Medications Medications  fentaNYL (SUBLIMAZE) injection 25 mcg (has no administration in time range)  fentaNYL (SUBLIMAZE) injection 25-100 mcg (50 mcg Intravenous Given 09/12/18 2300)  propofol (DIPRIVAN) 1000 MG/100ML infusion (5 mcg/kg/min  60 kg Intravenous New Bag/Given 09/12/18 2032)  ceFEPIme (MAXIPIME) 1 g in sodium chloride 0.9 % 100 mL IVPB (1 g Intravenous New Bag/Given 09/12/18 2316)  vancomycin (VANCOCIN) IVPB 1000 mg/200 mL premix (has no administration in time range)  pantoprazole (PROTONIX) injection 40 mg (40 mg Intravenous Given 09/12/18 2316)  acetaminophen (TYLENOL) tablet 650 mg (has no administration in time range)  ondansetron (ZOFRAN) injection 4 mg (has no administration in time range)  insulin aspart (novoLOG) injection 0-15 Units (has no administration in time range)  levETIRAcetam (KEPPRA) IVPB 1000 mg/100 mL premix (0 mg Intravenous Stopped 09/12/18 2100)  etomidate (AMIDATE) injection (20 mg Intravenous Given 09/12/18 2025)  rocuronium (ZEMURON) injection (70 mg Intravenous Given 09/12/18 2026)    Mobility walks with person assist     Focused Assessments Neuro Assessment Handoff:  Swallow screen pass? Can not assess at this time.  Cardiac Rhythm: Normal sinus rhythm       Neuro Assessment: Exceptions to W[facility] Neuro Checks:      Last Documented NIHSS Modified Score:   Has TPA been given? No If patient is a Neuro Trauma and patient is going to OR before floor call report to [place] nurse: [phone] or [phone]     R Recommendations: See Admitting Provider Note  Report given to:   Additional Notes: Pt. Is  sedated and intubated. Fentanyl 74mcg given for comfort.

## 2018-09-12 NOTE — ED Provider Notes (Signed)
[facility] Provider Note   CSN: [id number] Arrival date & time: 09/12/18  2013     History   Chief Complaint Chief Complaint  Patient presents with  . Seizures    HPI [name] is a 68 y.o. female.     HPI    57yF brought in by EMS. Hx from them and review of records. Coming from home. Last known normal around 1700. Husband subsequently found patient poorly responsive shortly before arrival.  Seemed conscious but not responding well. On EMS arrival pt was awake but not following commands or talking. CBG 170s. Felt that may be having inferior MI and made Code STEMI. Two witnessed generalized seizures for EMS and given 2.5 mg of versed.   Per PCP note on 06/09/18:  "History provided by husband as patient answers only yes and no   Stroke, embolic, 10 years ago  Patient has right hand contractures and has right foot drop She is able to walk with a 4 pronged arm cane, uses left arm Has good appetite, denies any dysphagia He reports mild comprehension issues  Has not had seizure in years"    Past Medical History:  Diagnosis Date  . Adenomatous polyp of colon 09/2012   repeat colonoscopy in 5 years by Dr. Sharlett Iles  . CHF (congestive heart failure) (HCC)    EF 15-20% as of 05/28/11 (Dr [facility] summary)  . CVA (cerebral infarction)   . Hemiparesis ([place])   . Hyperlipidemia   . Hypertension   . Seizures ([place])   . Sickle cell anemia (HCC)   . Stroke ([place])   . Vitamin D deficiency 2010    Patient Active Problem List   Diagnosis Date Noted  . Acquired right foot drop 01/12/2017  . Osteoporosis 12/03/2014  . Vitamin D deficiency 12/19/2012  . Dysarthria as late effect of cerebrovascular accident (CVA) 12/12/2012  . Gait instability 12/12/2012  . Contracture of joint 12/12/2012  . Hemiplegia of dominant side as late effect following cerebrovascular disease ([place]) 12/12/2012  . Anticoagulated on Coumadin  09/19/2011  . Secondary cardiomyopathy ([place]) 08/24/2011  . (L) MCA cardio embolic stroke  [id number]  . H/O tobacco use 06/25/2011  . Weakness 05/28/2011  . History of CVA (cerebrovascular accident) 05/28/2011  . HTN (hypertension) 05/28/2011  . HLD (hyperlipidemia) 05/28/2011  . Seizure disorder ([place]) 05/28/2011    Past Surgical History:  Procedure Laterality Date  . ABDOMINAL HYSTERECTOMY    . FRACTURE SURGERY    . HIP SURGERY    . TUBAL LIGATION       OB History   No obstetric history on file.      Home Medications    Prior to Admission medications   Medication Sig Start Date End Date Taking? Authorizing Provider  alendronate (FOSAMAX) 70 MG tablet TAKE 1 TABLET EVERY 7 DAYS WITH FULL GLASS OF WATER ON AN EMPTY STOMACH 03/04/18   [name], MD  atorvastatin (LIPITOR) 80 MG tablet Take 1 tablet (80 mg total) by mouth at bedtime. 09/10/18   Rutherford Guys, MD  baclofen (LIORESAL) 20 MG tablet TAKE 1 TABLET THREE TIMES DAILY (MAY TAKE AN ADDITIONAL TABLET MID-DAY OR BEFORE BED AS NEEDED FOR MUSCLE SPASMS) 06/09/18   Rutherford Guys, MD  carvedilol (COREG) 6.25 MG tablet Take 1 tablet (6.25 mg total) by mouth 2 (two) times daily. 09/10/18   Rutherford Guys, MD  digoxin (LANOXIN) 0.125 MG tablet TAKE 1 TABLET (125 MCG TOTAL)  BY MOUTH DAILY. 09/03/18   Rutherford Guys, MD  docusate sodium (COLACE) 50 MG capsule Take 1 capsule (50 mg total) by mouth daily. 03/26/14   [name], MD  hydrocortisone (ANUSOL-HC) 25 MG suppository Place 1 suppository (25 mg total) rectally 2 (two) times daily as needed for hemorrhoids or itching. 04/16/16   [name], MD  levETIRAcetam (KEPPRA) 500 MG tablet Take 1 tablet (500 mg total) by mouth 2 (two) times daily. 09/10/18   Rutherford Guys, MD  Liniments (HEET TRIPLE ACTION EX) Apply 1 application topically as needed (for pain).    [provider]  Misc. Devices [facility]) MISC 1 Units by Does not apply route as needed. Reason: I69.959,  Marylen Ponto, M21.371, M24.50 09/30/17   [name], MD  nystatin (MYCOSTATIN/NYSTOP) powder APPLY TOPICALLY FOUR TIMES DAILY. 04/22/17   [name], MD  polyethylene glycol [facility] / Floria Raveling) packet Take 17 g by mouth daily as needed for moderate constipation.    [provider]  triamcinolone cream (KENALOG) 0.1 % Apply 1 application topically 2 (two) times daily. 03/26/14   [name], MD  Vitamin D, Ergocalciferol, (DRISDOL) 1.25 MG (50000 UT) CAPS capsule TAKE 1 CAPSULE EVERY 7 DAYS 09/04/18   Rutherford Guys, MD  warfarin (COUMADIN) 5 MG tablet TAKE AS DIRECTED BY COUMADIN CLINIC. NEED OFFICE VISIT WITH FASTING LABS FOR REFILLS 09/03/18   Rutherford Guys, MD    Family History Family History  Problem Relation Age of Onset  . Diabetes Daughter   . Colon cancer Maternal Grandfather     Social History Social History   Tobacco Use  . Smoking status: Former Smoker    Types: Cigarettes    Quit date: 09/26/2007    Years since quitting: 10.9  . Smokeless tobacco: Never Used  Substance Use Topics  . Alcohol use: No  . Drug use: No     Allergies   Patient has no known allergies.   Review of Systems Review of Systems Level 5 caveat because pt is unresponsive.   Physical Exam Updated Vital Signs BP 114/76   Pulse (!) 135   Temp (!) 96.4 F (35.8 C) (Temporal)   Resp 16   Ht 5\' 2"  (1.575 m)   Wt 60 kg   SpO2 100%   BMI 24.19 kg/m   Physical Exam Vitals signs and nursing note reviewed.  Constitutional:      General: She is not in acute distress.    Appearance: She is well-developed. She is ill-appearing.  HENT:     Head: Normocephalic and atraumatic.  Eyes:     General:        Right eye: No discharge.        Left eye: No discharge.     Conjunctiva/sclera: Conjunctivae normal.  Neck:     Musculoskeletal: Neck supple.  Cardiovascular:     Rate and Rhythm: Normal rate and regular rhythm.     Heart sounds: Normal heart sounds. No murmur. No friction rub.  No gallop.   Pulmonary:     Effort: Respiratory distress present.     Breath sounds: Normal breath sounds.     Comments: Shallow irregular respirations.  Abdominal:     General: There is no distension.     Palpations: Abdomen is soft.     Tenderness: There is no abdominal tenderness.  Musculoskeletal:        General: No tenderness.  Skin:    General: Skin is  warm and dry.  Neurological:     GCS: GCS eye subscore is 2. GCS verbal subscore is 1. GCS motor subscore is 1.     Comments: GCS 4T. Opens eyes to voice. Contractures R side. L side flaccid and not withdrawing anything to pain. Nonverbal.       ED Treatments / Results  Labs (all labs ordered are listed, but only abnormal results are displayed) Labs Reviewed  CBC WITH DIFFERENTIAL/PLATELET - Abnormal; Notable for the following components:      Result Value   WBC 18.3 (*)    HCT 47.1 (*)    MCHC 29.7 (*)    Lymphs Abs 10.2 (*)    Basophils Absolute 0.2 (*)    All other components within normal limits  COMPREHENSIVE METABOLIC PANEL - Abnormal; Notable for the following components:   Potassium 5.2 (*)    CO2 13 (*)    Glucose, Bld 256 (*)    Creatinine, Ser 1.28 (*)    Calcium 8.7 (*)    AST 62 (*)    GFR calc non Af Amer 43 (*)    GFR calc Af Amer 49 (*)    Anion gap 20 (*)    All other components within normal limits  LACTIC ACID, PLASMA - Abnormal; Notable for the following components:   Lactic Acid, Venous 9.4 (*)    All other components within normal limits  URINALYSIS, ROUTINE W REFLEX MICROSCOPIC - Abnormal; Notable for the following components:   Glucose, UA 50 (*)    Protein, ur >=300 (*)    Bacteria, UA RARE (*)    All other components within normal limits  RAPID URINE DRUG SCREEN, HOSP PERFORMED - Abnormal; Notable for the following components:   Benzodiazepines POSITIVE (*)    All other components within normal limits  PROTIME-INR - Abnormal; Notable for the following components:   Prothrombin Time  19.0 (*)    INR 1.6 (*)    All other components within normal limits  CBC - Abnormal; Notable for the following components:   WBC 16.1 (*)    All other components within normal limits  COMPREHENSIVE METABOLIC PANEL - Abnormal; Notable for the following components:   CO2 19 (*)    Glucose, Bld 165 (*)    Calcium 8.0 (*)    AST 43 (*)    All other components within normal limits  LACTIC ACID, PLASMA - Abnormal; Notable for the following components:   Lactic Acid, Venous 2.8 (*)    All other components within normal limits  HEMOGLOBIN A1C - Abnormal; Notable for the following components:   Hgb A1c MFr Bld 5.9 (*)    All other components within normal limits  GLUCOSE, CAPILLARY - Abnormal; Notable for the following components:   Glucose-Capillary 126 (*)    All other components within normal limits  GLUCOSE, CAPILLARY - Abnormal; Notable for the following components:   Glucose-Capillary 103 (*)    All other components within normal limits  GLUCOSE, CAPILLARY - Abnormal; Notable for the following components:   Glucose-Capillary 100 (*)    All other components within normal limits  GLUCOSE, CAPILLARY - Abnormal; Notable for the following components:   Glucose-Capillary 111 (*)    All other components within normal limits  BASIC METABOLIC PANEL - Abnormal; Notable for the following components:   Potassium 3.4 (*)    Calcium 8.3 (*)    All other components within normal limits  CBC - Abnormal; Notable for the following components:  WBC 11.7 (*)    RBC 5.37 (*)    Hemoglobin 15.1 (*)    All other components within normal limits  PROTIME-INR - Abnormal; Notable for the following components:   Prothrombin Time 20.2 (*)    INR 1.8 (*)    All other components within normal limits  GLUCOSE, CAPILLARY - Abnormal; Notable for the following components:   Glucose-Capillary 120 (*)    All other components within normal limits  GLUCOSE, CAPILLARY - Abnormal; Notable for the following  components:   Glucose-Capillary 130 (*)    All other components within normal limits  GLUCOSE, CAPILLARY - Abnormal; Notable for the following components:   Glucose-Capillary 100 (*)    All other components within normal limits  GLUCOSE, CAPILLARY - Abnormal; Notable for the following components:   Glucose-Capillary 108 (*)    All other components within normal limits  GLUCOSE, CAPILLARY - Abnormal; Notable for the following components:   Glucose-Capillary 101 (*)    All other components within normal limits  PROTIME-INR - Abnormal; Notable for the following components:   Prothrombin Time 22.2 (*)    INR 2.0 (*)    All other components within normal limits  BASIC METABOLIC PANEL - Abnormal; Notable for the following components:   Potassium 3.2 (*)    Glucose, Bld 103 (*)    Calcium 8.4 (*)    All other components within normal limits  PHOSPHORUS - Abnormal; Notable for the following components:   Phosphorus 1.7 (*)    All other components within normal limits  CBC - Abnormal; Notable for the following components:   WBC 10.8 (*)    All other components within normal limits  GLUCOSE, CAPILLARY - Abnormal; Notable for the following components:   Glucose-Capillary 132 (*)    All other components within normal limits  GLUCOSE, CAPILLARY - Abnormal; Notable for the following components:   Glucose-Capillary 118 (*)    All other components within normal limits  GLUCOSE, CAPILLARY - Abnormal; Notable for the following components:   Glucose-Capillary 108 (*)    All other components within normal limits  GLUCOSE, CAPILLARY - Abnormal; Notable for the following components:   Glucose-Capillary 104 (*)    All other components within normal limits  CBG MONITORING, ED - Abnormal; Notable for the following components:   Glucose-Capillary 264 (*)    All other components within normal limits  POCT I-STAT 7, (LYTES, BLD GAS, ICA,H+H) - Abnormal; Notable for the following components:   pH,  Arterial 7.239 (*)    pO2, Arterial 141.0 (*)    Acid-base deficit 7.0 (*)    Potassium 3.3 (*)    All other components within normal limits  TROPONIN I (HIGH SENSITIVITY) - Abnormal; Notable for the following components:   Troponin I (High Sensitivity) 68 (*)    All other components within normal limits  TROPONIN I (HIGH SENSITIVITY) - Abnormal; Notable for the following components:   Troponin I (High Sensitivity) 1,074 (*)    All other components within normal limits  TROPONIN I (HIGH SENSITIVITY) - Abnormal; Notable for the following components:   Troponin I (High Sensitivity) 1,322 (*)    All other components within normal limits  TROPONIN I (HIGH SENSITIVITY) - Abnormal; Notable for the following components:   Troponin I (High Sensitivity) 1,905 (*)    All other components within normal limits  SARS CORONAVIRUS 2 (HOSPITAL ORDER, [facility])  CULTURE, BLOOD (ROUTINE X 2)  MRSA PCR SCREENING  CULTURE, BLOOD (ROUTINE X 2) W REFLEX TO ID PANEL  TRIGLYCERIDES  MAGNESIUM  ETHANOL  MAGNESIUM  PHOSPHORUS  LACTIC ACID, PLASMA  HIV ANTIBODY (ROUTINE TESTING W REFLEX)  GLUCOSE, CAPILLARY  MAGNESIUM  GLUCOSE, CAPILLARY  TRIGLYCERIDES  MAGNESIUM  PATHOLOGIST SMEAR REVIEW  I-STAT ARTERIAL BLOOD GAS, ED    EKG EKG Interpretation  Date/Time:  Friday September 12 2018 20:39:45 EDT Ventricular Rate:  132 PR Interval:    QRS Duration: 118 QT Interval:  394 QTC Calculation: 584 R Axis:   -61 Text Interpretation:  atrial fibrillation?  Incomplete right bundle branch block LVH with IVCD, LAD and secondary repol abnrm Inferior infarct, old Prolonged QT interval Confirmed by Virgel Manifold [phone]) on 09/12/2018 9:06:54 PM   Radiology Ct Head Wo Contrast  Result Date: 09/12/2018 CLINICAL DATA:  C-spine trauma, high clinical risk, found down EXAM: CT HEAD WITHOUT CONTRAST CT CERVICAL SPINE WITHOUT CONTRAST TECHNIQUE: Multidetector CT imaging of the head and  cervical spine was performed following the standard protocol without intravenous contrast. Multiplanar CT image reconstructions of the cervical spine were also generated. COMPARISON:  MR May 28, 2011 FINDINGS: CT HEAD FINDINGS Brain: Chronic encephalomalacia within the left MCA distribution similar to comparison from from 2013. No evidence of acute infarction, hemorrhage, hydrocephalus, extra-axial collection or mass lesion/mass effect. Symmetric prominence of the ventricles, cisterns and sulci compatible with parenchymal volume loss. Patchy areas of white matter hypoattenuation are most compatible with chronic microvascular angiopathy. Vascular: Atherosclerotic calcification of the carotid siphons. Skull: No calvarial fracture or suspicious osseous lesion. No scalp swelling or hematoma. Sinuses/Orbits: Paranasal sinuses and mastoid air cells are predominantly clear. Orbital structures are unremarkable. Other: None CT CERVICAL SPINE FINDINGS Alignment: Straightening of the normal cervical lordosis without traumatic listhesis. No abnormal facet widening. Normal alignment of the craniocervical and atlantoaxial articulations. Skull base and vertebrae: No acute fracture. No primary bone lesion or focal pathologic process. Soft tissues and spinal canal: No pre or paravertebral fluid or swelling. No visible canal hematoma. Disc levels: Multilevel cervical spondylitic changes are present with calcified disc osteophyte complexes at C3-4, C4-5, C5-6 these findings result in at most mild spinal canal stenosis. Uncinate spurring results and mild right neural foraminal narrowing at C5-6. No other significant canal or foraminal stenosis within the included portions of the cervical spine. Upper chest: Some interlobular septal thickening on a background of emphysematous change. Other: Endotracheal and transesophageal tubes are noted within their respective lumens. Small amount of airways secretions noted above the inflated  endotracheal balloon. Atherosclerotic plaque within the carotid bifurcations. Small amount of gas in the subclavian veins, likely iatrogenic related to intravenous access. IMPRESSION: 1. No acute intracranial abnormality. Remote left MCA distribution infarct. 2. No acute cervical spine fracture. Multilevel degenerative changes of the cervical spine as described above. 3. Endotracheal and transesophageal tubes are noted within their respective lumens. Small amount of airways secretions noted above the inflated endotracheal balloon. Electronically Signed   By: Lovena Le M.D.   On: 09/12/2018 21:14   Ct Cervical Spine Wo Contrast  Result Date: 09/12/2018 CLINICAL DATA:  C-spine trauma, high clinical risk, found down EXAM: CT HEAD WITHOUT CONTRAST CT CERVICAL SPINE WITHOUT CONTRAST TECHNIQUE: Multidetector CT imaging of the head and cervical spine was performed following the standard protocol without intravenous contrast. Multiplanar CT image reconstructions of the cervical spine were also generated. COMPARISON:  MR May 28, 2011 FINDINGS: CT HEAD FINDINGS Brain: Chronic encephalomalacia within the left MCA distribution similar to comparison from from 2013.  No evidence of acute infarction, hemorrhage, hydrocephalus, extra-axial collection or mass lesion/mass effect. Symmetric prominence of the ventricles, cisterns and sulci compatible with parenchymal volume loss. Patchy areas of white matter hypoattenuation are most compatible with chronic microvascular angiopathy. Vascular: Atherosclerotic calcification of the carotid siphons. Skull: No calvarial fracture or suspicious osseous lesion. No scalp swelling or hematoma. Sinuses/Orbits: Paranasal sinuses and mastoid air cells are predominantly clear. Orbital structures are unremarkable. Other: None CT CERVICAL SPINE FINDINGS Alignment: Straightening of the normal cervical lordosis without traumatic listhesis. No abnormal facet widening. Normal alignment of the  craniocervical and atlantoaxial articulations. Skull base and vertebrae: No acute fracture. No primary bone lesion or focal pathologic process. Soft tissues and spinal canal: No pre or paravertebral fluid or swelling. No visible canal hematoma. Disc levels: Multilevel cervical spondylitic changes are present with calcified disc osteophyte complexes at C3-4, C4-5, C5-6 these findings result in at most mild spinal canal stenosis. Uncinate spurring results and mild right neural foraminal narrowing at C5-6. No other significant canal or foraminal stenosis within the included portions of the cervical spine. Upper chest: Some interlobular septal thickening on a background of emphysematous change. Other: Endotracheal and transesophageal tubes are noted within their respective lumens. Small amount of airways secretions noted above the inflated endotracheal balloon. Atherosclerotic plaque within the carotid bifurcations. Small amount of gas in the subclavian veins, likely iatrogenic related to intravenous access. IMPRESSION: 1. No acute intracranial abnormality. Remote left MCA distribution infarct. 2. No acute cervical spine fracture. Multilevel degenerative changes of the cervical spine as described above. 3. Endotracheal and transesophageal tubes are noted within their respective lumens. Small amount of airways secretions noted above the inflated endotracheal balloon. Electronically Signed   By: Lovena Le M.D.   On: 09/12/2018 21:14   Dg Chest Port 1 View  Result Date: 09/14/2018 CLINICAL DATA:  Acute respiratory failure. EXAM: PORTABLE CHEST 1 VIEW COMPARISON:  Chest radiograph 09/13/2018 FINDINGS: Stable enlarged cardiac and mediastinal contours. Elevation right hemidiaphragm. Limited exam due to overlapping hardware. Minimal bibasilar atelectasis. No large area pulmonary consolidation. No pleural effusion or pneumothorax. IMPRESSION: Improving bibasilar atelectasis. Exam limited due to overlapping hardware.  Electronically Signed   By: Lovey Newcomer M.D.   On: 09/14/2018 09:18   Dg Chest Port 1 View  Result Date: 09/13/2018 CLINICAL DATA:  Respiratory failure EXAM: PORTABLE CHEST 1 VIEW COMPARISON:  Chest radiograph 09/12/2018 FINDINGS: ETT terminates distal trachea. Enteric tube courses inferior to the diaphragm. Monitoring leads overlie the patient. Stable cardiac and mediastinal contours. Improving bilateral lower lung opacities. No definite pleural effusion or pneumothorax. IMPRESSION: Improving bilateral lower lung opacities which may represent improving atelectasis or infection. Electronically Signed   By: Lovey Newcomer M.D.   On: 09/13/2018 09:30   Dg Chest Portable 1 View  Result Date: 09/12/2018 CLINICAL DATA:  Ventilator dependence. EXAM: PORTABLE CHEST 1 VIEW COMPARISON:  Earlier same day FINDINGS: Endotracheal tube tip is 1.4 cm above the base of the carina. The NG tube passes into the stomach although the distal tip position is not included on the film. The cardio pericardial silhouette is enlarged. Bibasilar atelectasis or infiltrate noted. The visualized bony structures of the thorax are intact. Insert wire IMPRESSION: Cardiomegaly with bibasilar atelectasis or infiltrate. Electronically Signed   By: Misty Stanley M.D.   On: 09/12/2018 21:23    Procedures Procedures (including critical care time)  CRITICAL CARE Performed by: Virgel Manifold Total critical care time: 45 minutes Critical care time was exclusive of separately billable  procedures and treating other patients. Critical care was necessary to treat or prevent imminent or life-threatening deterioration. Critical care was time spent personally by me on the following activities: development of treatment plan with patient and/or surrogate as well as nursing, discussions with consultants, evaluation of patient's response to treatment, examination of patient, obtaining history from patient or surrogate, ordering and performing treatments  and interventions, ordering and review of laboratory studies, ordering and review of radiographic studies, pulse oximetry and re-evaluation of patient's condition.  INTUBATION Performed by: Virgel Manifold  Required items: required blood products, implants, devices, and special equipment available Patient identity confirmed: provided demographic data and hospital-assigned identification number Time out: Immediately prior to procedure a "time out" was called to verify the correct patient, procedure, equipment, support staff and site/side marked as required.  Indications: airway protection  Intubation method: Glidescope Laryngoscopy   Preoxygenation: BVM  Sedatives: Etomidate Paralytic: ROcuronium  Tube Size: 7.5 cuffed  Post-procedure assessment: chest rise and ETCO2 monitor Breath sounds: equal and absent over the epigastrium Tube secured with: ETT holder Chest x-ray interpreted by radiologist and me.  Chest x-ray findings: endotracheal tube in appropriate position  Patient tolerated the procedure well with no immediate complications.  Medications Ordered in ED Medications  fentaNYL (SUBLIMAZE) injection 25 mcg (has no administration in time range)  fentaNYL (SUBLIMAZE) injection 25-100 mcg (has no administration in time range)  propofol (DIPRIVAN) 1000 MG/100ML infusion (5 mcg/kg/min  60 kg Intravenous New Bag/Given 09/12/18 2032)  levETIRAcetam (KEPPRA) IVPB 1000 mg/100 mL premix (1,000 mg Intravenous New Bag/Given 09/12/18 2037)  etomidate (AMIDATE) injection (20 mg Intravenous Given 09/12/18 2025)  rocuronium (ZEMURON) injection (70 mg Intravenous Given 09/12/18 2026)     Initial Impression / Assessment and Plan / ED Course  I have reviewed the triage vital signs and the nursing notes.  Pertinent labs & imaging results that were available during my care of the patient were reviewed by me and considered in my medical decision making (see chart for details).    67yF with  AMS. Witnessed seizures by EMS and given versed. On arrival to the ED she had a GCS of 4. Would open eyes to painful stimuli. Known R sided deficits. Not moving L at all to painful stimuli. She was intubated for airway protection. Loaded with 1g keppra. Propofol for continued sedation/seizure. Cardiology fellow at bedside when patient arrived to ED. We both agree not STEMI. Neurology consulted. CCM consulted for admission.   Final Clinical Impressions(s) / ED Diagnoses   Final diagnoses:  Status epilepticus [facility])    ED Discharge Orders    None       Virgel Manifold, MD 09/15/18 [phone]

## 2018-09-12 NOTE — H&P (Signed)
NAME:  [name], MRN:  [id number], DOB:  07/09/1950, LOS: 0 ADMISSION DATE:  09/12/2018, CONSULTATION DATE: 09/12/2018 REFERRING MD: Emergency, CHIEF COMPLAINT: Seizure activity  Brief History   68 year old female with a history of seizure and CVA with right-sided hemi-plegia with witnessed CVA earlier today.  History of present illness   68 year old female with a history of seizure and CVA with right-sided hemi-plegia with witnessed CVA earlier today.  Patient was urgently intubated due to inability to protect airway.  Husband relates that she ran out of all her medications which she obtains by mail approximately 4 days ago when he been unable to obtain refills.  She had required assistance to the bathroom this afternoon which is unusual for her and he had left her sitting on the bed only to find her at the side of the bed tremulous apparently seizing. On my evaluation in the emergency room the patient has no overt seizure activity.  Neurology has evaluated her here.  She is ventilated initial blood gas somewhat acidotic.  Past Medical History   . Acquired right foot drop 01/12/2017  . Osteoporosis 12/03/2014  . Vitamin D deficiency 12/19/2012  . Dysarthria as late effect of cerebrovascular accident (CVA) 12/12/2012  . Gait instability 12/12/2012  . Contracture of joint 12/12/2012  . Hemiplegia of dominant side as late effect following cerebrovascular disease ([place]) 12/12/2012  . Anticoagulated on Coumadin 09/19/2011  . Secondary cardiomyopathy ([place]) 08/24/2011  . (L) MCA cardio embolic stroke  99991111  . H/O tobacco use 06/25/2011  . Weakness 05/28/2011  . History of CVA (cerebrovascular accident) 05/28/2011  . HTN (hypertension) 05/28/2011  . HLD (hyperlipidemia) 05/28/2011  . Seizure disorder ([place]) 05/28/2011     Significant Hospital Events   NA  Consults:  Neurology  Procedures:  NA  Significant Diagnostic Tests:  Normal CT scan of the brain without acute  changes  Micro Data:  NA  Antimicrobials:  Empiric vancomycin and cefepime  Interim history/subjective:  NA  Objective   Blood pressure 114/76, pulse (!) 135, temperature (!) 96.4 F (35.8 C), temperature source Temporal, resp. rate 16, height 5\' 2"  (1.575 m), weight 60 kg, SpO2 100 %.    Vent Mode: PRVC FiO2 (%):  [60 %-100 %] 60 % Set Rate:  [18 bmp-20 bmp] 20 bmp Vt Set:  [400 mL] 400 mL PEEP:  [5 cmH20] 5 cmH20 Plateau Pressure:  [19 cmH20] 19 cmH20  No intake or output data in the 24 hours ending 09/12/18 2209 Filed Weights   09/12/18 2000  Weight: 60 kg    Examination: General: Well-developed black female with some hand deformity HENT: Intubated Lungs: Clear Cardiovascular: Regular Abdomen: Soft bowel sounds positive Extremities: Within normal limits no edema Neuro: Sedated GU: N/A  Resolved Hospital Problem list   NA  Assessment & Plan:  1.  Intubation due to inability to protect airway: We will await follow-up blood gas correct pH as necessary ventilator management per protocol  2.  Recurrent seizure: We will leave Keppra loading and dosing to neurology.  CT scan shows no acute changes.  3.  History of CVA with right-sided hemiparesis and dysarthria  4.  Lactic acidosis secondary to seizure will follow  5. AKI:  Follow for now Cr 1.28      Best practice:  Diet: np0 Pain/Anxiety/Delirium protocol (if indicated): propofol for vent sedation VAP protocol (if indicated):yes per protocol DVT prophylaxis: yes GI prophylaxis: yes Glucose control:monitor Mobility: bed rest Code Status: full Family Communication: discussed with  husband Disposition: icu  Labs   CBC: Recent Labs  Lab 09/12/18 2022 09/12/18 2148  WBC 18.3*  --   NEUTROABS PENDING  --   HGB 14.0 12.9  HCT 47.1* 38.0  MCV 92.7  --   PLT 385  --     Basic Metabolic Panel: Recent Labs  Lab 09/12/18 2022 09/12/18 2148  NA 137 140  K 5.2* 3.3*  CL 104  --   CO2 13*  --    GLUCOSE 256*  --   BUN 17  --   CREATININE 1.28*  --   CALCIUM 8.7*  --   MG 2.0  --    GFR: Estimated Creatinine Clearance: 32.8 mL/min (A) (by C-G formula based on SCr of 1.28 mg/dL (H)). Recent Labs  Lab 09/12/18 2022  WBC 18.3*  LATICACIDVEN 9.4*    Liver Function Tests: Recent Labs  Lab 09/12/18 2022  AST 62*  ALT 21  ALKPHOS 115  BILITOT 1.0  PROT 7.5  ALBUMIN 3.9   No results for input(s): LIPASE, AMYLASE in the last 168 hours. No results for input(s): AMMONIA in the last 168 hours.  ABG    Component Value Date/Time   PHART 7.239 (L) 09/12/2018 2148   PCO2ART 47.8 09/12/2018 2148   PO2ART 141.0 (H) 09/12/2018 2148   HCO3 20.5 09/12/2018 2148   TCO2 22 09/12/2018 2148   ACIDBASEDEF 7.0 (H) 09/12/2018 2148   O2SAT 99.0 09/12/2018 2148     Coagulation Profile: Recent Labs  Lab 09/12/18 2022  INR 1.6*    Cardiac Enzymes: No results for input(s): CKTOTAL, CKMB, CKMBINDEX, TROPONINI in the last 168 hours.  HbA1C: Hemoglobin A1C  Date/Time Value Ref Range Status  08/15/2012 10:36 AM 5.3  Final   Hgb A1c MFr Bld  Date/Time Value Ref Range Status  01/13/2008 09:18 AM  4.6 - 6.1 % Final   5.4 (NOTE)   The ADA recommends the following therapeutic goal for glycemic   control related to Hgb A1C measurement:   Goal of Therapy:   < 7.0% Hgb A1C   Reference: American Diabetes Association: Clinical Practice   Recommendations 2008, Diabetes Care,  2008, 31:(Suppl 1).    CBG: Recent Labs  Lab 09/12/18 2023  GLUCAP 21*    Review of Systems:   Sedated ventilated, husband has no knowledge of any change in routine behavior or condition until this pm  Past Medical History  She,  has a past medical history of Adenomatous polyp of colon (09/2012), CHF (congestive heart failure) ([place]), CVA (cerebral infarction), Hemiparesis ([place]), Hyperlipidemia, Hypertension, Seizures ([place]), Sickle cell anemia ([place]), Stroke ([place]), and Vitamin D deficiency (2010).   Surgical  History    Past Surgical History:  Procedure Laterality Date  . ABDOMINAL HYSTERECTOMY    . FRACTURE SURGERY    . HIP SURGERY    . TUBAL LIGATION       Social History   reports that she quit smoking about 10 years ago. Her smoking use included cigarettes. She has never used smokeless tobacco. She reports that she does not drink alcohol or use drugs.   Family History   Her family history includes Colon cancer in her maternal grandfather; Diabetes in her daughter.   Allergies No Known Allergies   Home Medications  Prior to Admission medications   Medication Sig Start Date End Date Taking? Authorizing Provider  alendronate (FOSAMAX) 70 MG tablet TAKE 1 TABLET EVERY 7 DAYS WITH FULL GLASS OF WATER ON AN EMPTY STOMACH  03/04/18   [name], MD  atorvastatin (LIPITOR) 80 MG tablet Take 1 tablet (80 mg total) by mouth at bedtime. 09/10/18   Rutherford Guys, MD  baclofen (LIORESAL) 20 MG tablet TAKE 1 TABLET THREE TIMES DAILY (MAY TAKE AN ADDITIONAL TABLET MID-DAY OR BEFORE BED AS NEEDED FOR MUSCLE SPASMS) 06/09/18   Rutherford Guys, MD  carvedilol (COREG) 6.25 MG tablet Take 1 tablet (6.25 mg total) by mouth 2 (two) times daily. 09/10/18   Rutherford Guys, MD  digoxin (LANOXIN) 0.125 MG tablet TAKE 1 TABLET (125 MCG TOTAL) BY MOUTH DAILY. 09/03/18   Rutherford Guys, MD  docusate sodium (COLACE) 50 MG capsule Take 1 capsule (50 mg total) by mouth daily. 03/26/14   [name], MD  hydrocortisone (ANUSOL-HC) 25 MG suppository Place 1 suppository (25 mg total) rectally 2 (two) times daily as needed for hemorrhoids or itching. 04/16/16   [name], MD  levETIRAcetam (KEPPRA) 500 MG tablet Take 1 tablet (500 mg total) by mouth 2 (two) times daily. 09/10/18   Rutherford Guys, MD  Liniments (HEET TRIPLE ACTION EX) Apply 1 application topically as needed (for pain).    [provider]  Misc. Devices [facility]) MISC 1 Units by Does not apply route as needed. Reason: I69.959, Marylen Ponto,  M21.371, M24.50 09/30/17   [name], MD  nystatin (MYCOSTATIN/NYSTOP) powder APPLY TOPICALLY FOUR TIMES DAILY. 04/22/17   [name], MD  polyethylene glycol [facility] Floria Raveling) packet Take 17 g by mouth daily as needed for moderate constipation.    [provider]  triamcinolone cream (KENALOG) 0.1 % Apply 1 application topically 2 (two) times daily. 03/26/14   [name], MD  Vitamin D, Ergocalciferol, (DRISDOL) 1.25 MG (50000 UT) CAPS capsule TAKE 1 CAPSULE EVERY 7 DAYS 09/04/18   Rutherford Guys, MD  warfarin (COUMADIN) 5 MG tablet TAKE AS DIRECTED BY COUMADIN CLINIC. NEED OFFICE VISIT WITH FASTING LABS FOR REFILLS 09/03/18   Rutherford Guys, MD     Critical care time: 35 minutes spent in chart review, bedside evaluation and discussing plan with care team

## 2018-09-12 NOTE — ED Notes (Signed)
Pt restless on vent; fentanyl given per prn orders

## 2018-09-12 NOTE — Progress Notes (Signed)
PT transported to CT and back. 

## 2018-09-12 NOTE — ED Triage Notes (Signed)
Pt arrived from home by EMS as code stemi and seizure. Husband heard pt fall, fire found pt lying on the floor. Hx of seizure and stroke, but per EMS has been a while since last seizures; pt has R sided paralysis and aphasia from previous stroke.  Husband reports known well 1700. EMS reported two seizures enroute, received 2.5mg  versed. EKG showed elevation in inferior leads, STEMI activated enroute. c-collar in place. Pt has not taken coumadin, carvedilol, or torvastatin in 3 days.  EMS VS 158/107, pulse 131, CBG 174 22g L C and 20g L wrist

## 2018-09-13 ENCOUNTER — Inpatient Hospital Stay (HOSPITAL_COMMUNITY): Payer: Medicare HMO

## 2018-09-13 DIAGNOSIS — G40909 Epilepsy, unspecified, not intractable, without status epilepticus: Secondary | ICD-10-CM

## 2018-09-13 DIAGNOSIS — J9601 Acute respiratory failure with hypoxia: Secondary | ICD-10-CM

## 2018-09-13 LAB — COMPREHENSIVE METABOLIC PANEL
ALT: 22 U/L (ref 0–44)
AST: 43 U/L — ABNORMAL HIGH (ref 15–41)
Albumin: 3.6 g/dL (ref 3.5–5.0)
Alkaline Phosphatase: 98 U/L (ref 38–126)
Anion gap: 14 (ref 5–15)
BUN: 13 mg/dL (ref 8–23)
CO2: 19 mmol/L — ABNORMAL LOW (ref 22–32)
Calcium: 8 mg/dL — ABNORMAL LOW (ref 8.9–10.3)
Chloride: 104 mmol/L (ref 98–111)
Creatinine, Ser: 0.93 mg/dL (ref 0.44–1.00)
GFR calc Af Amer: >60 mL/min (ref >60)
GFR calc non Af Amer: >60 mL/min (ref >60)
Glucose, Bld: 165 mg/dL — ABNORMAL HIGH (ref 70–99)
Potassium: 4.6 mmol/L (ref 3.5–5.1)
Sodium: 137 mmol/L (ref 135–145)
Total Bilirubin: 0.7 mg/dL (ref 0.3–1.2)
Total Protein: 6.9 g/dL (ref 6.5–8.1)

## 2018-09-13 LAB — GLUCOSE, CAPILLARY
Glucose-Capillary: 100 mg/dL — ABNORMAL HIGH (ref 70–99)
Glucose-Capillary: 103 mg/dL — ABNORMAL HIGH (ref 70–99)
Glucose-Capillary: 111 mg/dL — ABNORMAL HIGH (ref 70–99)
Glucose-Capillary: 120 mg/dL — ABNORMAL HIGH (ref 70–99)
Glucose-Capillary: 126 mg/dL — ABNORMAL HIGH (ref 70–99)
Glucose-Capillary: 130 mg/dL — ABNORMAL HIGH (ref 70–99)
Glucose-Capillary: 94 mg/dL (ref 70–99)

## 2018-09-13 LAB — TROPONIN I (HIGH SENSITIVITY)
Troponin I (High Sensitivity): 1074 ng/L (ref <18)
Troponin I (High Sensitivity): 1322 ng/L (ref <18)
Troponin I (High Sensitivity): 1905 ng/L (ref <18)

## 2018-09-13 LAB — HEMOGLOBIN A1C
Hgb A1c MFr Bld: 5.9 % — ABNORMAL HIGH (ref 4.8–5.6)
Mean Plasma Glucose: 122.63 mg/dL

## 2018-09-13 LAB — TRIGLYCERIDES: Triglycerides: 126 mg/dL (ref <150)

## 2018-09-13 LAB — HIV ANTIBODY (ROUTINE TESTING W REFLEX): HIV Screen 4th Generation wRfx: NONREACTIVE

## 2018-09-13 LAB — MAGNESIUM: Magnesium: 1.7 mg/dL (ref 1.7–2.4)

## 2018-09-13 LAB — MRSA PCR SCREENING: MRSA by PCR: NEGATIVE

## 2018-09-13 LAB — PHOSPHORUS: Phosphorus: 3.7 mg/dL (ref 2.5–4.6)

## 2018-09-13 LAB — LACTIC ACID, PLASMA
Lactic Acid, Venous: 2.8 mmol/L (ref 0.5–1.9)
Lactic Acid, Venous: 1.9 mmol/L (ref 0.5–1.9)

## 2018-09-13 MED ORDER — WARFARIN SODIUM 5 MG PO TABS
5.0000 mg | ORAL_TABLET | Freq: Once | ORAL | Status: DC
  Filled 2018-09-13: qty 1

## 2018-09-13 MED ORDER — CHLORHEXIDINE GLUCONATE 0.12% ORAL RINSE (MEDLINE KIT)
15.0000 mL | Freq: Two times a day (BID) | OROMUCOSAL | Status: DC
  Administered 2018-09-13 (×3): 15 mL via OROMUCOSAL

## 2018-09-13 MED ORDER — LACTATED RINGERS IV SOLN
INTRAVENOUS | Status: DC
  Administered 2018-09-13 – 2018-09-14 (×2): via INTRAVENOUS

## 2018-09-13 MED ORDER — SODIUM CHLORIDE 0.9 % IV SOLN
20.0000 mg/kg | Freq: Once | INTRAVENOUS | Status: AC
  Administered 2018-09-13: 1228 mg via INTRAVENOUS
  Filled 2018-09-13: qty 24.56

## 2018-09-13 MED ORDER — ORAL CARE MOUTH RINSE
15.0000 mL | OROMUCOSAL | Status: DC
  Administered 2018-09-13 (×8): 15 mL via OROMUCOSAL

## 2018-09-13 MED ORDER — WARFARIN - PHARMACIST DOSING INPATIENT: Freq: Every day | Status: DC

## 2018-09-13 MED ORDER — PHENYTOIN SODIUM 50 MG/ML IJ SOLN
100.0000 mg | Freq: Three times a day (TID) | INTRAMUSCULAR | Status: DC
  Administered 2018-09-13 – 2018-09-17 (×11): 100 mg via INTRAVENOUS
  Filled 2018-09-13 (×11): qty 2

## 2018-09-13 MED ORDER — LEVETIRACETAM IN NACL 1500 MG/100ML IV SOLN
1500.0000 mg | Freq: Two times a day (BID) | INTRAVENOUS | Status: DC
  Administered 2018-09-13 – 2018-09-18 (×10): 1500 mg via INTRAVENOUS
  Filled 2018-09-13 (×11): qty 100

## 2018-09-13 MED ORDER — LEVETIRACETAM IN NACL 500 MG/100ML IV SOLN
500.0000 mg | Freq: Two times a day (BID) | INTRAVENOUS | Status: DC
  Administered 2018-09-13: 500 mg via INTRAVENOUS
  Filled 2018-09-13: qty 100

## 2018-09-13 NOTE — Progress Notes (Signed)
ANTICOAGULATION CONSULT NOTE - Initial Consult  Pharmacy Consult for Warfarin Indication: history of stroke  No Known Allergies  Patient Measurements: Height: 5\' 2"  (157.5 cm) Weight: 135 lb 5.8 oz (61.4 kg) IBW/kg (Calculated) : 50.1  Vital Signs: Temp: 101 F (38.3 C) (08/22 1200) Temp Source: Oral (08/22 1200) BP: 130/62 (08/22 1400) Pulse Rate: 93 (08/22 1400)  Labs: Recent Labs    09/12/18 2022 09/12/18 2148 09/13/18 0127 09/13/18 0129 09/13/18 0130 09/13/18 0355 09/13/18 0520  HGB 14.0 12.9 13.9  --   --   --   --   HCT 47.1* 38.0 43.0  --   --   --   --   PLT 385  --  262  --   --   --   --   LABPROT 19.0*  --   --   --   --   --   --   INR 1.6*  --   --   --   --   --   --   CREATININE 1.28*  --   --   --  0.93  --   --   TROPONINIHS 68*  --   --  1,074*  --  1,322* 1,905*    Estimated Creatinine Clearance: 49.2 mL/min (by C-G formula based on SCr of 0.93 mg/dL).   Medical History: Past Medical History:  Diagnosis Date  . Adenomatous polyp of colon 09/2012   repeat colonoscopy in 5 years by Dr. Sharlett Iles  . CHF (congestive heart failure) (H[facility])    EF 15-20% as of 05/28/11 (Dr Legrand Como [facility]-Hospital D/C summary)  . CVA (cerebral infarction)   . Hemiparesis ([place])   . Hyperlipidemia   . Hypertension   . Seizures ([place])   . Sickle cell anemia ([facility])   . Stroke ([place])   . Vitamin D deficiency 2010    Assessment: 68 year old female admitted 8/21 after witnessed seizure at home. Patient had run out of Keppra medication for 4 days. Last dose of Warfarin was on 8/19 per husband. INR on admission was 1.6. H/H and Platelets are within normal limits. Patient is still having seizures per EEG. CT Head was negative for acute abnormality. CCM confirmed ok for warfarin restart.   Goal of Therapy:  INR 2-3 Monitor platelets by anticoagulation protocol: Yes   Plan:  Warfarin 5mg  po x1 tonight Daily PT/INR  Sloan Leiter, PharmD, BCPS, BCCCP Clinical  Pharmacist Please refer to [facility] for [place] numbers 09/13/2018,2:26 PM

## 2018-09-13 NOTE — Progress Notes (Signed)
NAME:  [name], MRN:  [id number], DOB:  18-Nov-1950, LOS: 1 ADMISSION DATE:  09/12/2018, CONSULTATION DATE: 09/12/2018 REFERRING MD: Emergency, CHIEF COMPLAINT: Seizure activity  Brief History   68 year old female with a history of seizure and CVA with right-sided hemi-plegia with witnessed CVA earlier today.  History of present illness   68 year old female with a history of seizure and CVA with right-sided hemi-plegia with witnessed CVA earlier today.  Patient was urgently intubated due to inability to protect airway.  Husband relates that she ran out of all her medications which she obtains by mail approximately 4 days ago when he been unable to obtain refills.  She had required assistance to the bathroom this afternoon which is unusual for her and he had left her sitting on the bed only to find her at the side of the bed tremulous apparently seizing. On my evaluation in the emergency room the patient has no overt seizure activity.  Neurology has evaluated her here.  She is ventilated initial blood gas somewhat acidotic.   Significant Hospital Events   NA  Consults:  Neurology  Procedures:  NA  Significant Diagnostic Tests:  Normal CT scan of the brain without acute changes  Micro Data:  NA  Antimicrobials:  Empiric vancomycin and cefepime x1  8/21  Interim history/subjective:  No further seizure activity noted since stabilized, Keppra reinitiated Renal function is normalized Currently on propofol 10, decreased from 25 Tolerating PS 10  Objective   Blood pressure 124/83, pulse 92, temperature 99.7 F (37.6 C), temperature source Axillary, resp. rate 16, height 5\' 2"  (1.575 m), weight 61.4 kg, SpO2 99 %.    Vent Mode: PSV;CPAP FiO2 (%):  [40 %-100 %] 40 % Set Rate:  [18 bmp-20 bmp] 20 bmp Vt Set:  [400 mL] 400 mL PEEP:  [5 cmH20] 5 cmH20 Pressure Support:  [10 cmH20] 10 cmH20 Plateau Pressure:  [16 cmH20-19 cmH20] 16 cmH20   Intake/Output Summary (Last 24  hours) at 09/13/2018 0853 Last data filed at 09/13/2018 0800 Gross per 24 hour  Intake 672.93 ml  Output 305 ml  Net 367.93 ml   Filed Weights   09/12/18 2000 09/13/18 0000 09/13/18 0500  Weight: 60 kg 61.4 kg 61.4 kg    Examination: General: Weak chronically ill-appearing woman, no distress HENT: ET tube in place, some oral secretions coming out of her mouth Lungs: Clear bilaterally, no wheezing, no crackles Cardiovascular: [place], distant, no murmur Abdomen: Soft, nondistended, positive bowel sounds Extremities: No significant edema Neuro: She does wake up to voice and appears to track, delayed response but nodded to questions, follow commands on the left.  Did not move the right upper extremity or either lower extremity, foot drop present  Resolved Hospital Problem list   NA  Assessment & Plan:   Acute respiratory failure due to inability protect airway in the setting seizures -Transition to pressure support and minimize sedation on 8/22, goal assess for extubation now that mental status is improving, seizures have been stopped  History of seizure activity in the setting of prior CVA.  Unfortunately she had run out of her Keppra at home -Keppra initiated -Neurology following -Head CT reassuring 8/21  Acute renal failure, resolved -Continue to follow urine output, BMP  Lactic acidosis in the setting of seizure activity, cleared on 8/22  Hypertension, probable diastolic dysfunction -Home carvedilol on hold  History of CVA, on chronic Coumadin for anticoagulation -Reinitiate Coumadin per pharmacy 8/22  Nutrition -Initiate tube feeding if she is not  can be extubated quickly    Best practice:  Diet: np0 Pain/Anxiety/Delirium protocol (if indicated): propofol for vent sedation VAP protocol (if indicated):yes per protocol DVT prophylaxis: yes GI prophylaxis: yes Glucose control:monitor Mobility: bed rest Code Status: full Family Communication: Dr. Mariane Masters discussed  with husband 8/21 evening Disposition: icu  Labs   CBC: Recent Labs  Lab 09/12/18 2022 09/12/18 2148 09/13/18 0127  WBC 18.3*  --  16.1*  NEUTROABS 7.0  --   --   HGB 14.0 12.9 13.9  HCT 47.1* 38.0 43.0  MCV 92.7  --  86.3  PLT 385  --  99991111    Basic Metabolic Panel: Recent Labs  Lab 09/12/18 2022 09/12/18 2148 09/13/18 0127 09/13/18 0130  NA 137 140  --  137  K 5.2* 3.3*  --  4.6  CL 104  --   --  104  CO2 13*  --   --  19*  GLUCOSE 256*  --   --  165*  BUN 17  --   --  13  CREATININE 1.28*  --   --  0.93  CALCIUM 8.7*  --   --  8.0*  MG 2.0  --  1.7  --   PHOS  --   --  3.7  --    GFR: Estimated Creatinine Clearance: 49.2 mL/min (by C-G formula based on SCr of 0.93 mg/dL). Recent Labs  Lab 09/12/18 2022 09/13/18 0127 09/13/18 0355  WBC 18.3* 16.1*  --   LATICACIDVEN 9.4* 2.8* 1.9    Liver Function Tests: Recent Labs  Lab 09/12/18 2022 09/13/18 0130  AST 62* 43*  ALT 21 22  ALKPHOS 115 98  BILITOT 1.0 0.7  PROT 7.5 6.9  ALBUMIN 3.9 3.6   No results for input(s): LIPASE, AMYLASE in the last 168 hours. No results for input(s): AMMONIA in the last 168 hours.  ABG    Component Value Date/Time   PHART 7.239 (L) 09/12/2018 2148   PCO2ART 47.8 09/12/2018 2148   PO2ART 141.0 (H) 09/12/2018 2148   HCO3 20.5 09/12/2018 2148   TCO2 22 09/12/2018 2148   ACIDBASEDEF 7.0 (H) 09/12/2018 2148   O2SAT 99.0 09/12/2018 2148     Coagulation Profile: Recent Labs  Lab 09/12/18 2022  INR 1.6*    Cardiac Enzymes: No results for input(s): CKTOTAL, CKMB, CKMBINDEX, TROPONINI in the last 168 hours.  HbA1C: Hemoglobin A1C  Date/Time Value Ref Range Status  08/15/2012 10:36 AM 5.3  Final   Hgb A1c MFr Bld  Date/Time Value Ref Range Status  09/13/2018 01:27 AM 5.9 (H) 4.8 - 5.6 % Final    Comment:    (NOTE) Pre diabetes:          5.7%-6.4% Diabetes:              >6.4% Glycemic control for   <7.0% adults with diabetes   01/13/2008 09:18 AM  4.6 -  6.1 % Final   5.4 (NOTE)   The ADA recommends the following therapeutic goal for glycemic   control related to Hgb A1C measurement:   Goal of Therapy:   < 7.0% Hgb A1C   Reference: American Diabetes Association: Clinical Practice   Recommendations 2008, Diabetes Care,  2008, 31:(Suppl 1).    CBG: Recent Labs  Lab 09/12/18 2023 09/13/18 0005 09/13/18 0324 09/13/18 0730  GLUCAP 264* 126* 103* 94    Independent critical care time 33 minutes  Baltazar Apo, MD, PhD 09/13/2018, 9:04 AM [facility] [phone] or if no answer [phone]

## 2018-09-13 NOTE — Progress Notes (Signed)
CRITICAL VALUE ALERT  Critical Value:  Troponin 1,074      Lactic Acid 2.8  Date & Time Notied:  8/22   0400  Provider Notified: Elink RN to communicate with Dr. Jimmy Footman  Orders Received/Actions taken: LR @50 , q2 troponin, foot stick for difficult blood draw

## 2018-09-13 NOTE — Progress Notes (Signed)
EEG completed, results pending. 

## 2018-09-13 NOTE — Procedures (Signed)
Extubation Procedure Note  Patient Details:   Name: [name] DOB: 04/07/1950 MRN: [id number]   Airway Documentation:    Vent end date: 09/13/18 Vent end time: 1103   Evaluation  O2 sats: stable throughout Complications: No apparent complications Patient did tolerate procedure well. Bilateral Breath Sounds: Rhonchi, Diminished   Yes   Pt extubated to 6L [place] per MD order. Cuff leak heard prior to extubation. Pt able to voice name after endotracheal tube removed. RN and RT at bedside. Vitals are stable.   Esperanza Sheets T 09/13/2018, 11:05 AM

## 2018-09-13 NOTE — Procedures (Signed)
Patient Name: [name]  MRN: [id number]  Epilepsy Attending: Lora Havens  Referring Physician/Provider: Laurey Morale, NP Date: 09/13/2018 Duration: 2  Patient history: 68 yo F with history of epilepsy who presented with breakthrough seizure. EEG to evaluate for seizures/status  Level of alertness: awake, lethargic  AEDs during EEG study: LEV  Technical aspects: This EEG study was done with scalp electrodes positioned according to the 10-20 International system of electrode placement. Electrical activity was acquired at a sampling rate of 500Hz  and reviewed with a high frequency filter of 70Hz  and a low frequency filter of 1Hz . EEG data were recorded continuously and digitally stored.   DESCRIPTION:  The posterior dominant rhythm consists of 8-9 Hz activity of moderate voltage (25-35 uV) seen predominantly in posterior head regions, symmetric and reactive to eye opening and eye closing. There were also 0.5Hz  PLEDs in left frontotemporal region. Total of seven seizures were recorded at 1319, 1325, 1332, 1338, 1344, 1346,1350, each lasting average 30 seconds during which either there were no clinical signs or patient appeared to be less responsive. Concomitant EEG showed seizure arising from left frontotemporal region with rhythmic high amplitude sharply contoured 4-5 Hz theta slowing which gradually increases in frequency and evolves to involve left hemisphere. Hyperventilation and photic stimulation were not performed.  IMPRESSION: This study showed status epilepticus arising from left frontotemporal region as well as frequent sharo waves ( PLEDs) from the same region suggestive of acute/subacute cortical dysfunction.    Treating provider was notified.

## 2018-09-13 NOTE — Progress Notes (Signed)
MEDICATION RELATED CONSULT NOTE - INITIAL   Pharmacy Consult for phenytoin  Indication: seizures  No Known Allergies  Patient Measurements: Height: 5\' 2"  (157.5 cm) Weight: 135 lb 5.8 oz (61.4 kg) IBW/kg (Calculated) : 50.1  Vital Signs: Temp: 100.1 F (37.8 C) (08/22 1600) Temp Source: Oral (08/22 1600) BP: 128/65 (08/22 1600) Pulse Rate: 78 (08/22 1600) Intake/Output from previous day: 08/21 0701 - 08/22 0700 In: 542.5 [I.V.:205.6; IV Piggyback:336.9] Out: 305 [Urine:305] Intake/Output from this shift: Total I/O In: 441.5 [I.V.:241.5; IV Piggyback:200] Out: 560 [Urine:500; Emesis/NG output:60]  Labs: Recent Labs    09/12/18 2022 09/12/18 2148 09/13/18 0127 09/13/18 0130  WBC 18.3*  --  16.1*  --   HGB 14.0 12.9 13.9  --   HCT 47.1* 38.0 43.0  --   PLT 385  --  262  --   CREATININE 1.28*  --   --  0.93  MG 2.0  --  1.7  --   PHOS  --   --  3.7  --   ALBUMIN 3.9  --   --  3.6  PROT 7.5  --   --  6.9  AST 62*  --   --  43*  ALT 21  --   --  22  ALKPHOS 115  --   --  98  BILITOT 1.0  --   --  0.7   Estimated Creatinine Clearance: 49.2 mL/min (by C-G formula based on SCr of 0.93 mg/dL).    Medical History: Past Medical History:  Diagnosis Date  . Adenomatous polyp of colon 09/2012   repeat colonoscopy in 5 years by Dr. Sharlett Iles  . CHF (congestive heart failure) (HCC)    EF 15-20% as of 05/28/11 (Dr Legrand Como [facility]-Hospital D/C summary)  . CVA (cerebral infarction)   . Hemiparesis ([place])   . Hyperlipidemia   . Hypertension   . Seizures ([place])   . Sickle cell anemia ([facility])   . Stroke ([place])   . Vitamin D deficiency 2010     Assessment: 68 yo female with EEG showing status epilepticus (history of seizures on keppra PTA; medication compliance concerns noted). Pharmacy consulted to dose phenytoin. She is also noted on keppra -albumin= 3.6, CrCl ~ 50  Goal of Therapy:  Phenytoin level= 10-20  Plan:  -Phenytoin 100mg  IV q8h -Check a phenytoin level in 3  days or earlier based on acuity  Hildred Laser, PharmD Clinical Pharmacist **Pharmacist phone directory can now be found on [email or web address] ([email or web address]).  Listed under [place].

## 2018-09-13 NOTE — Progress Notes (Signed)
vLTM EEG started following spot EEG. Notified neuro 

## 2018-09-13 NOTE — Progress Notes (Signed)
ANTICOAGULATION CONSULT NOTE  Pharmacy Consult for Warfarin Indication: history of stroke  No Known Allergies  Patient Measurements: Height: 5\' 2"  (157.5 cm) Weight: 135 lb 5.8 oz (61.4 kg) IBW/kg (Calculated) : 50.1  Vital Signs: Temp: 101 F (38.3 C) (08/22 1200) Temp Source: Oral (08/22 1200) BP: 128/65 (08/22 1600) Pulse Rate: 78 (08/22 1600)  Labs: Recent Labs    09/12/18 2022 09/12/18 2148 09/13/18 0127 09/13/18 0129 09/13/18 0130 09/13/18 0355 09/13/18 0520  HGB 14.0 12.9 13.9  --   --   --   --   HCT 47.1* 38.0 43.0  --   --   --   --   PLT 385  --  262  --   --   --   --   LABPROT 19.0*  --   --   --   --   --   --   INR 1.6*  --   --   --   --   --   --   CREATININE 1.28*  --   --   --  0.93  --   --   TROPONINIHS 68*  --   --  1,074*  --  1,322* 1,905*    Estimated Creatinine Clearance: 49.2 mL/min (by C-G formula based on SCr of 0.93 mg/dL).   Medical History: Past Medical History:  Diagnosis Date  . Adenomatous polyp of colon 09/2012   repeat colonoscopy in 5 years by Dr. Sharlett Iles  . CHF (congestive heart failure) (HCC)    EF 15-20% as of 05/28/11 (Dr [facility] summary)  . CVA (cerebral infarction)   . Hemiparesis ([place])   . Hyperlipidemia   . Hypertension   . Seizures ([place])   . Sickle cell anemia (HCC)   . Stroke ([place])   . Vitamin D deficiency 2010    Assessment: 68 year old female admitted 8/21 after witnessed seizure at home. Patient had run out of Keppra medication for 4 days. Last dose of Warfarin was on 8/19 per husband. INR on admission was 1.6.  She is unable to tolerate po at this time. Discussed with Dr. Lamonte Sakai and will hold coumadin for now.   Goal of Therapy:  INR 2-3 Monitor platelets by anticoagulation protocol: Yes   Plan:  Hold coumadin Daily PT/INR  Hildred Laser, PharmD Clinical Pharmacist **Pharmacist phone directory can now be found on [email or web address] ([email or web address]).  Listed under [place].

## 2018-09-13 NOTE — Progress Notes (Addendum)
NEURO HOSPITALIST PROGRESS NOTE   Subjective: Patient in bed asleep. Easily arouses to name calling. Patient is intubated not sedated. Able to follow simple commands and answer yes/no questions.   Exam: Vitals:   09/13/18 0730 09/13/18 0745  BP: 121/73 121/73  Pulse: 86 99  Resp: (!) 23   Temp:    SpO2: 100% 100%    Physical Exam   HEENT-  Normocephalic, no lesions, without obvious abnormality.  Normal external eye and conjunctiva.   Cardiovascular- S1-S2 audible, pulses palpable throughout   Lungs-, no excessive working breathing.  Saturations within normal limits intubated.  Extremities- Warm, dry and intact Musculoskeletal-right side with contracture and increased tone.  Skin-warm and dry, no hyperpigmentation, vitiligo, or suspicious lesions   Neuro:  Mental Status: Patient opens eyes to name calling. Able to follow simple commands open/ close eyes. Move LLE and LUE. Cranial Nerves: Face appears symmetric in the presence of ETT. PERRL no gaze preference noted.  Motor: No movement in RUE and RLE. Purposeful movement of LUE and LLE.  Sensory: LUE and LLE able to move respond to light touch.  Deep Tendon Reflexes: 3+  Biceps and patella right side. 2+ biceps, patella left side  Cerebellar: Not tested Gait: deferred    Medications:  Scheduled: . chlorhexidine gluconate (MEDLINE KIT)  15 mL Mouth Rinse BID  . Chlorhexidine Gluconate Cloth  6 each Topical Q0600  . insulin aspart  0-15 Units Subcutaneous Q4H  . mouth rinse  15 mL Mouth Rinse 10 times per day  . pantoprazole (PROTONIX) IV  40 mg Intravenous QHS   Continuous: . lactated ringers 50 mL/hr at 09/13/18 0700  . levETIRAcetam 500 mg (09/13/18 0745)  . propofol (DIPRIVAN) infusion 10 mcg/kg/min (09/13/18 0742)   BMW:[id number], fentaNYL (SUBLIMAZE) injection, fentaNYL (SUBLIMAZE) injection, ondansetron (ZOFRAN) IV  Pertinent Labs/Diagnostics:   Ct Head Wo Contrast  Result Date:  09/12/2018 CLINICAL DATA:  C-spine trauma, high clinical risk, found down EXAM: CT HEAD WITHOUT CONTRAST CT CERVICAL SPINE WITHOUT CONTRAST TECHNIQUE: Multidetector CT imaging of the head and cervical spine was performed following the standard protocol without intravenous contrast. Multiplanar CT image reconstructions of the cervical spine were also generated. COMPARISON:  MR May 28, 2011 FINDINGS: CT HEAD FINDINGS Brain: Chronic encephalomalacia within the left MCA distribution similar to comparison from from 2013. No evidence of acute infarction, hemorrhage, hydrocephalus, extra-axial collection or mass lesion/mass effect. Symmetric prominence of the ventricles, cisterns and sulci compatible with parenchymal volume loss. Patchy areas of white matter hypoattenuation are most compatible with chronic microvascular angiopathy. Vascular: Atherosclerotic calcification of the carotid siphons. Skull: No calvarial fracture or suspicious osseous lesion. No scalp swelling or hematoma. Sinuses/Orbits: Paranasal sinuses and mastoid air cells are predominantly clear. Orbital structures are unremarkable. Other: None CT CERVICAL SPINE FINDINGS Alignment: Straightening of the normal cervical lordosis without traumatic listhesis. No abnormal facet widening. Normal alignment of the craniocervical and atlantoaxial articulations. Skull base and vertebrae: No acute fracture. No primary bone lesion or focal pathologic process. Soft tissues and spinal canal: No pre or paravertebral fluid or swelling. No visible canal hematoma. Disc levels: Multilevel cervical spondylitic changes are present with calcified disc osteophyte complexes at C3-4, C4-5, C5-6 these findings result in at most mild spinal canal stenosis. Uncinate spurring results and mild right neural foraminal narrowing at C5-6. No other significant canal or foraminal stenosis within the included portions  of the cervical spine. Upper chest: Some interlobular septal thickening on a  background of emphysematous change. Other: Endotracheal and transesophageal tubes are noted within their respective lumens. Small amount of airways secretions noted above the inflated endotracheal balloon. Atherosclerotic plaque within the carotid bifurcations. Small amount of gas in the subclavian veins, likely iatrogenic related to intravenous access. IMPRESSION: 1. No acute intracranial abnormality. Remote left MCA distribution infarct. 2. No acute cervical spine fracture. Multilevel degenerative changes of the cervical spine as described above. 3. Endotracheal and transesophageal tubes are noted within their respective lumens. Small amount of airways secretions noted above the inflated endotracheal balloon. Electronically Signed   By: Lovena Le M.D.   On: 09/12/2018 21:14   Ct Cervical Spine Wo Contrast  Result Date: 09/12/2018 CLINICAL DATA:  C-spine trauma, high clinical risk, found down EXAM: CT HEAD WITHOUT CONTRAST CT CERVICAL SPINE WITHOUT CONTRAST TECHNIQUE: Multidetector CT imaging of the head and cervical spine was performed following the standard protocol without intravenous contrast. Multiplanar CT image reconstructions of the cervical spine were also generated. COMPARISON:  MR May 28, 2011 FINDINGS: CT HEAD FINDINGS Brain: Chronic encephalomalacia within the left MCA distribution similar to comparison from from 2013. No evidence of acute infarction, hemorrhage, hydrocephalus, extra-axial collection or mass lesion/mass effect. Symmetric prominence of the ventricles, cisterns and sulci compatible with parenchymal volume loss. Patchy areas of white matter hypoattenuation are most compatible with chronic microvascular angiopathy. Vascular: Atherosclerotic calcification of the carotid siphons. Skull: No calvarial fracture or suspicious osseous lesion. No scalp swelling or hematoma. Sinuses/Orbits: Paranasal sinuses and mastoid air cells are predominantly clear. Orbital structures are unremarkable.  Other: None CT CERVICAL SPINE FINDINGS Alignment: Straightening of the normal cervical lordosis without traumatic listhesis. No abnormal facet widening. Normal alignment of the craniocervical and atlantoaxial articulations. Skull base and vertebrae: No acute fracture. No primary bone lesion or focal pathologic process. Soft tissues and spinal canal: No pre or paravertebral fluid or swelling. No visible canal hematoma. Disc levels: Multilevel cervical spondylitic changes are present with calcified disc osteophyte complexes at C3-4, C4-5, C5-6 these findings result in at most mild spinal canal stenosis. Uncinate spurring results and mild right neural foraminal narrowing at C5-6. No other significant canal or foraminal stenosis within the included portions of the cervical spine. Upper chest: Some interlobular septal thickening on a background of emphysematous change. Other: Endotracheal and transesophageal tubes are noted within their respective lumens. Small amount of airways secretions noted above the inflated endotracheal balloon. Atherosclerotic plaque within the carotid bifurcations. Small amount of gas in the subclavian veins, likely iatrogenic related to intravenous access. IMPRESSION: 1. No acute intracranial abnormality. Remote left MCA distribution infarct. 2. No acute cervical spine fracture. Multilevel degenerative changes of the cervical spine as described above. 3. Endotracheal and transesophageal tubes are noted within their respective lumens. Small amount of airways secretions noted above the inflated endotracheal balloon. Electronically Signed   By: Lovena Le M.D.   On: 09/12/2018 21:14   Dg Chest Portable 1 View  Result Date: 09/12/2018 CLINICAL DATA:  Ventilator dependence. EXAM: PORTABLE CHEST 1 VIEW COMPARISON:  Earlier same day FINDINGS: Endotracheal tube tip is 1.4 cm above the base of the carina. The NG tube passes into the stomach although the distal tip position is not included on the  film. The cardio pericardial silhouette is enlarged. Bibasilar atelectasis or infiltrate noted. The visualized bony structures of the thorax are intact. Insert wire IMPRESSION: Cardiomegaly with bibasilar atelectasis or infiltrate. Electronically Signed  By: Misty Stanley M.D.   On: 09/12/2018 21:23   Assessment:  68 year old female with a history of seizures due to a previous left MCA infarct who presents with breakthrough seizures in the setting of medication noncompliance (due to delays in refills).  patient has been improving. Will continue to hold off on repear MRI at this time. Routine EEG pending.    Recommendations:  --EEG --restart home Keppra 500 twice daily --neurology will follow  Laurey Morale, MSN, NP-C [facility] [phone]  Attending neurologist's note to follow  09/13/2018, 8:10 AM    NEUROHOSPITALIST ADDENDUM Performed a face to face diagnostic evaluation.   I have reviewed the contents of history and physical exam as documented by PA/ARNP/Resident and agree with above documentation.  I have discussed and formulated the above plan as documented. Edits to the note have been made as needed.  68 year old female with large left MCA stroke and aphasia and right hemiparesis presents with seizures in the setting of noncompliance.  On assessment patient was awake and would answer yes to every question.  No obvious clinical seizure activity was noted.  However when EEG was obtained patient had multiple electrographic seizures arising on the left hemisphere.  Patient was connected to long-term EEG and additional 1.5 g of Keppra were obtained.  Again at 3:30 PM, received call from on-call epileptologist that  patient continued to have seizures on EEG.  Loaded patient with fosphenytoin 20 mg/kg and started on Dilantin maintenance.  Reviewed EEG again around 7 PM EEG had improved significantly.  Signed out to incoming neurologist Dr.  Leonel Ramsay.  Impression Nonconvulsive status epilepticus  I spent 45 minutes in the care of this neurologic critically ill patient who was in nonconvulsive status epilepticus.  The patient is neurologically critically ill and could to deteriorate due to worsening seizures, respiratory failure, aspiration, sepsis, cardiac arrest and other complications from ICU stay.  Her care included reviewing EEG, discussion with epileptologist, complex decision making, reviewing vitals, labs, clinical exam and chart review.      Karena Addison Sadiq Mccauley MD [facility] [phone]   If 7pm to 7am, please call on call as listed on [facility].

## 2018-09-13 NOTE — Consult Note (Signed)
Neurology Consultation Reason for Consult: Seizures Referring Physician: Wilson Singer, S  CC: Seizures  History is obtained from: Patient  HPI: [name] is a 68 y.o. female with a history of stroke causing right hemiparesis and some degree of aphasia as well as seizures who is managed on Keppra with good control.  Unfortunately, she has had issues with getting her prescriptions.  Her husband states that they called the pharmacy prior to her running out of medicine, but the pharmacy stated that he called the physician's office, and after multiple delays they are currently being mailed to her now.  Unfortunately she has been out of her Keppra for a couple of days.  Tonight, he heard her in the bathroom and went to check on her and found her on the ground trembling.  On arrival, she had a low GCS and was intubated.  Since that time she has had significant improvement, start moving her left side and starting to look around.    ROS: A 14 point ROS was performed and is negative except as noted in the HPI.   Past Medical History:  Diagnosis Date  . Adenomatous polyp of colon 09/2012   repeat colonoscopy in 5 years by Dr. Sharlett Iles  . CHF (congestive heart failure) (HCC)    EF 15-20% as of 05/28/11 (Dr [facility] summary)  . CVA (cerebral infarction)   . Hemiparesis ([place])   . Hyperlipidemia   . Hypertension   . Seizures ([place])   . Sickle cell anemia (HCC)   . Stroke ([place])   . Vitamin D deficiency 2010     Family History  Problem Relation Age of Onset  . Diabetes Daughter   . Colon cancer Maternal Grandfather      Social History:  reports that she quit smoking about 10 years ago. Her smoking use included cigarettes. She has never used smokeless tobacco. She reports that she does not drink alcohol or use drugs.   Exam: Current vital signs: BP 123/82   Pulse 89   Temp 98.9 F (37.2 C)   Resp 15   Ht 5\' 2"  (1.575 m)   Wt 60 kg   SpO2 100%   BMI 24.19 kg/m   Vital signs in last 24 hours: Temp:  [96.4 F (35.8 C)-98.9 F (37.2 C)] 98.9 F (37.2 C) (08/21 2330) Pulse Rate:  [88-135] 89 (08/21 2330) Resp:  [12-21] 15 (08/21 2330) BP: (114-154)/(76-98) 123/82 (08/21 2330) SpO2:  [100 %] 100 % (08/21 2330) FiO2 (%):  [60 %-100 %] 60 % (08/21 2201) Weight:  [60 kg] 60 kg (08/21 2000)   Physical Exam  Constitutional: Appears well-developed and well-nourished.  Psych: Affect appropriate to situation Eyes: No scleral injection HENT: ET tube in place Head: Normocephalic.  Cardiovascular: Normal rate and regular rhythm.  Respiratory: Ventilated GI: Soft.  No distension. There is no tenderness.  Skin: WDI  Neuro: Mental Status: Patient opens her eyes to noxious simulation, she does fixate and engage with the examiner after stimulation.  She does not follow commands. Cranial Nerves: II: She does not blink to threat from either direction. Pupils are equal, round, and reactive to light.   III,IV, VI: She has a mild left gaze preference, but does cross midline. V: VII: Blinks to eyelid stimulation bilaterally Motor: She has no movement in her right upper or right lower extremity, but this is baseline.  She has quasi-purposeful movements of the left arm and leg.   Sensory: She responds to noxious dilation on  the left but not right  Cerebellar: Does not perform   I have reviewed labs in epic and the results pertinent to this consultation are: CMP-mild hyperkalemia 5.2, glycemia at 256 Creatinine 1.28, elevated from 0.82 months ago  I have reviewed the images obtained: CT head- old left MCA infarct  Impression: 68 year old female with a history of seizures due to a previous left MCA infarct who presents with breakthrough seizures in the setting of medication noncompliance(due to delays in refills).  She does appear to be improving, my concern for ongoing seizure is low.  Repeat EEG would be reasonable, but as long as she continues to improve,  I think we can hold off on repeat MRI.  Recommendations: 1) EEG 2) restart home Keppra 500 twice daily 3) neurology will follow   Roland Rack, MD [facility] [phone]  If 7pm- 7am, please page neurology on call as listed in [place].

## 2018-09-14 ENCOUNTER — Inpatient Hospital Stay (HOSPITAL_COMMUNITY): Payer: Medicare HMO

## 2018-09-14 LAB — CBC
HCT: 43 % (ref 36.0–46.0)
HCT: 45.7 % (ref 36.0–46.0)
Hemoglobin: 13.9 g/dL (ref 12.0–15.0)
Hemoglobin: 15.1 g/dL — ABNORMAL HIGH (ref 12.0–15.0)
MCH: 27.9 pg (ref 26.0–34.0)
MCH: 28.1 pg (ref 26.0–34.0)
MCHC: 32.3 g/dL (ref 30.0–36.0)
MCHC: 33 g/dL (ref 30.0–36.0)
MCV: 86.3 fL (ref 80.0–100.0)
MCV: 85.1 fL (ref 80.0–100.0)
Platelets: 262 10*3/uL (ref 150–400)
Platelets: 271 10*3/uL (ref 150–400)
RBC: 4.98 MIL/uL (ref 3.87–5.11)
RBC: 5.37 MIL/uL — ABNORMAL HIGH (ref 3.87–5.11)
RDW: 13.6 % (ref 11.5–15.5)
RDW: 13.8 % (ref 11.5–15.5)
WBC: 16.1 10*3/uL — ABNORMAL HIGH (ref 4.0–10.5)
WBC: 11.7 10*3/uL — ABNORMAL HIGH (ref 4.0–10.5)
nRBC: 0 % (ref 0.0–0.2)
nRBC: 0 % (ref 0.0–0.2)

## 2018-09-14 LAB — BASIC METABOLIC PANEL
Anion gap: 12 (ref 5–15)
BUN: 8 mg/dL (ref 8–23)
CO2: 24 mmol/L (ref 22–32)
Calcium: 8.3 mg/dL — ABNORMAL LOW (ref 8.9–10.3)
Chloride: 103 mmol/L (ref 98–111)
Creatinine, Ser: 0.85 mg/dL (ref 0.44–1.00)
GFR calc Af Amer: >60 mL/min (ref >60)
GFR calc non Af Amer: >60 mL/min (ref >60)
Glucose, Bld: 86 mg/dL (ref 70–99)
Potassium: 3.4 mmol/L — ABNORMAL LOW (ref 3.5–5.1)
Sodium: 139 mmol/L (ref 135–145)

## 2018-09-14 LAB — GLUCOSE, CAPILLARY
Glucose-Capillary: 100 mg/dL — ABNORMAL HIGH (ref 70–99)
Glucose-Capillary: 82 mg/dL (ref 70–99)
Glucose-Capillary: 108 mg/dL — ABNORMAL HIGH (ref 70–99)
Glucose-Capillary: 101 mg/dL — ABNORMAL HIGH (ref 70–99)
Glucose-Capillary: 132 mg/dL — ABNORMAL HIGH (ref 70–99)

## 2018-09-14 LAB — PROTIME-INR
INR: 1.8 — ABNORMAL HIGH (ref 0.8–1.2)
Prothrombin Time: 20.2 seconds — ABNORMAL HIGH (ref 11.4–15.2)

## 2018-09-14 LAB — MAGNESIUM: Magnesium: 1.8 mg/dL (ref 1.7–2.4)

## 2018-09-14 LAB — TRIGLYCERIDES: Triglycerides: 82 mg/dL (ref <150)

## 2018-09-14 MED ORDER — DEXTROSE-NACL 5-0.45 % IV SOLN
INTRAVENOUS | Status: DC
  Administered 2018-09-14: 10:00:00 via INTRAVENOUS

## 2018-09-14 MED ORDER — POTASSIUM CHLORIDE 10 MEQ/100ML IV SOLN
10.0000 meq | INTRAVENOUS | Status: AC
  Administered 2018-09-14 (×3): 10 meq via INTRAVENOUS
  Filled 2018-09-14: qty 100

## 2018-09-14 MED ORDER — WARFARIN SODIUM 5 MG PO TABS
5.0000 mg | ORAL_TABLET | Freq: Once | ORAL | Status: AC
  Administered 2018-09-14: 5 mg via ORAL
  Filled 2018-09-14: qty 1

## 2018-09-14 MED ORDER — SODIUM CHLORIDE 0.9% FLUSH
10.0000 mL | Freq: Two times a day (BID) | INTRAVENOUS | Status: DC
  Administered 2018-09-14 – 2018-09-20 (×13): 10 mL

## 2018-09-14 MED ORDER — WARFARIN - PHARMACIST DOSING INPATIENT
Freq: Every day | Status: DC
  Administered 2018-09-18 – 2018-09-19 (×2)

## 2018-09-14 MED ORDER — SODIUM CHLORIDE 0.9 % IV SOLN
100.0000 mg | Freq: Two times a day (BID) | INTRAVENOUS | Status: DC
  Administered 2018-09-14 – 2018-09-17 (×8): 100 mg via INTRAVENOUS
  Filled 2018-09-14 (×10): qty 10

## 2018-09-14 MED ORDER — SODIUM CHLORIDE 0.9 % IV SOLN
200.0000 mg | INTRAVENOUS | Status: AC
  Administered 2018-09-14: 200 mg via INTRAVENOUS
  Filled 2018-09-14: qty 20

## 2018-09-14 MED ORDER — SODIUM CHLORIDE 0.9% FLUSH: 10.0000 mL | INTRAVENOUS | Status: DC | PRN

## 2018-09-14 MED ORDER — LORAZEPAM 2 MG/ML IJ SOLN
2.0000 mg | INTRAMUSCULAR | Status: AC
  Administered 2018-09-14: 2 mg via INTRAVENOUS
  Filled 2018-09-14: qty 1

## 2018-09-14 NOTE — Progress Notes (Signed)
vLTM EEG maintenance complete. No skin breakdown. Continue to monitor

## 2018-09-14 NOTE — Progress Notes (Signed)
ANTICOAGULATION CONSULT NOTE - Initial Consult  Pharmacy Consult for Warfarin Indication: history of stroke  No Known Allergies  Patient Measurements: Height: 5\' 2"  (157.5 cm) Weight: 135 lb 9.3 oz (61.5 kg) IBW/kg (Calculated) : 50.1  Vital Signs: Temp: 99 F (37.2 C) (08/23 1200) Temp Source: Oral (08/23 1200) BP: 135/66 (08/23 1100) Pulse Rate: 86 (08/23 1100)  Labs: Recent Labs    09/12/18 2022 09/12/18 2148 09/13/18 0127 09/13/18 0129 09/13/18 0130 09/13/18 0355 09/13/18 0520 09/14/18 0732  HGB 14.0 12.9 13.9  --   --   --   --  15.1*  HCT 47.1* 38.0 43.0  --   --   --   --  45.7  PLT 385  --  262  --   --   --   --  271  LABPROT 19.0*  --   --   --   --   --   --  20.2*  INR 1.6*  --   --   --   --   --   --  1.8*  CREATININE 1.28*  --   --   --  0.93  --   --  0.85  TROPONINIHS 68*  --   --  1,074*  --  1,322* 1,905*  --     Estimated Creatinine Clearance: 53.9 mL/min (by C-G formula based on SCr of 0.85 mg/dL).   Medical History: Past Medical History:  Diagnosis Date  . Adenomatous polyp of colon 09/2012   repeat colonoscopy in 5 years by Dr. Sharlett Iles  . CHF (congestive heart failure) (HCC)    EF 15-20% as of 05/28/11 (Dr [facility] summary)  . CVA (cerebral infarction)   . Hemiparesis ([place])   . Hyperlipidemia   . Hypertension   . Seizures ([place])   . Sickle cell anemia (HCC)   . Stroke ([place])   . Vitamin D deficiency 2010    Assessment: 68 year old female admitted 8/21 after witnessed seizure at home. Patient had run out of Keppra medication for 4 days. Last dose of Warfarin was on 8/19 per husband. INR 1.6 >>1.8 (Phenytoin added, no Warfarin yet). H/H and Platelets are within normal limits. CT Head was negative for acute abnormality. CCM confirmed ok for warfarin restart and patient has now passed swallow evaluation.   Goal of Therapy:  INR 2-3 Monitor platelets by anticoagulation protocol: Yes   Plan:  Warfarin 5mg  po x1  tonight Daily PT/INR  Sloan Leiter, PharmD, BCPS, BCCCP Clinical Pharmacist Please refer to [facility] for [place] numbers 09/14/2018,1:40 PM

## 2018-09-14 NOTE — Evaluation (Signed)
Clinical/Bedside Swallow Evaluation Patient Details  Name: [name] MRN: [id number] Date of Birth: 08-28-1950  Today's Date: 09/14/2018 Time: SLP Start Time (ACUTE ONLY): 0940 SLP Stop Time (ACUTE ONLY): 1002 SLP Time Calculation (min) (ACUTE ONLY): 22 min  Past Medical History:  Past Medical History:  Diagnosis Date  . Adenomatous polyp of colon 09/2012   repeat colonoscopy in 5 years by Dr. Sharlett Iles  . CHF (congestive heart failure) (HCC)    EF 15-20% as of 05/28/11 (Dr Legrand [facility] summary)  . CVA (cerebral infarction)   . Hemiparesis ([place])   . Hyperlipidemia   . Hypertension   . Seizures ([place])   . Sickle cell anemia (HCC)   . Stroke ([place])   . Vitamin D deficiency 2010   Past Surgical History:  Past Surgical History:  Procedure Laterality Date  . ABDOMINAL HYSTERECTOMY    . FRACTURE SURGERY    . HIP SURGERY    . TUBAL LIGATION     HPI:  68 year old female with a history of seizures due to a previous left MCA infarct (2009 with residual aphasia and right hemiplegia) who presented 8/21 with breakthrough seizures in the setting of medication noncompliance (due to delays in refills). Intubated in ED, extubated 8/22.  Spoke with pt's husband by phone, who reports no difficulty with feeding herself, no swallowing deficits PTA.  Pt generally communicates with yes/no responses and gestures (communication boards have not been effective in the past - pt underwent speech therapy at [facility] speech clinic for several years.)    Assessment / Plan / Recommendation Clinical Impression  Pt presents with an acute dysphagia s/p seizure activity.  She is alert, slightly drowsy, but pleasant and participatory.  She speaks with poor intelligibility; tends to make needs known through gesture, yes/no responses.  Pt demonstrates labial weakness and decreased sensation on right, with difficulty containing bolus material, resulting in spillage from mouth while eating.  Thin liquids are  intermittently ejected during attempts to propel backwards with tongue.  Pt shows intermittent awareness of presence of residue/spillage.  There are no overt s/s of aspiration with liquids or solids.  Pt able to hold cup to deliver liquids with assist for stability.  Unable to coordinate use of straw.  For today, recommend starting a dysphagia 1 diet with thin liquids.  Assist with feeding; give meds whole in puree.  SLP will follow for safety/diet advancement.  D/W RN, pt's husband.  SLP Visit Diagnosis: Dysphagia, oral phase (R13.11)    Aspiration Risk       Diet Recommendation   dysphagia 1, thin liquids  Medication Administration: Whole meds with puree    Other  Recommendations Oral Care Recommendations: Oral care BID   Follow up Recommendations        Frequency and Duration min 3x week  2 weeks       Prognosis Prognosis for Safe Diet Advancement: Good      Swallow Study   General Date of Onset: 09/12/18 HPI: 68 year old female with a history of seizures due to a previous left MCA infarct (2009 with residual aphasia and right hemiplegia) who presented 8/21 with breakthrough seizures in the setting of medication noncompliance (due to delays in refills). Intubated in ED, extubated 8/22.  Spoke with pt's husband by phone, who reports no difficulty with feeding herself, no swallowing deficits PTA.  Pt generally communicates with yes/no responses and gestures (communication boards have not been effective in the past - pt underwent speech therapy at [facility] speech clinic  for several years.)  Type of Study: Bedside Swallow Evaluation Previous Swallow Assessment: 2013 - normal findings Diet Prior to this Study: NPO Temperature Spikes Noted: No Respiratory Status: Room air History of Recent Intubation: Yes Length of Intubations (days): 1 days Date extubated: 09/13/18 Behavior/Cognition: Alert;Cooperative;Lethargic/Drowsy Oral Cavity Assessment: Within Functional Limits Oral Care  Completed by SLP: Recent completion by staff Oral Cavity - Dentition: Poor condition Self-Feeding Abilities: Needs assist Patient Positioning: Upright in bed Baseline Vocal Quality: Normal Volitional Cough: Cognitively unable to elicit Volitional Swallow: Unable to elicit    Oral/Motor/Sensory Function Overall Oral Motor/Sensory Function: Moderate impairment Facial ROM: Reduced right;Suspected CN VII (facial) dysfunction Facial Symmetry: Abnormal symmetry right;Suspected CN VII (facial) dysfunction Facial Sensation: Reduced right;Suspected CN V (Trigeminal) dysfunction   Ice Chips Ice chips: Impaired Presentation: Spoon Oral Phase Impairments: Reduced labial seal Oral Phase Functional Implications: Right anterior spillage Pharyngeal Phase Impairments: Suspected delayed Swallow   Thin Liquid Thin Liquid: Impaired Presentation: Cup;Straw Oral Phase Impairments: Reduced labial seal;Reduced lingual movement/coordination Oral Phase Functional Implications: Right anterior spillage Pharyngeal  Phase Impairments: Suspected delayed Swallow    Nectar Thick Nectar Thick Liquid: Not tested   Honey Thick Honey Thick Liquid: Not tested   Puree Puree: Impaired Presentation: Spoon Oral Phase Impairments: Reduced labial seal;Reduced lingual movement/coordination Oral Phase Functional Implications: Right anterior spillage;Right lateral sulci pocketing Pharyngeal Phase Impairments: Suspected delayed Swallow   Solid     Solid: Not tested      Juan Quam Laurice 09/14/2018,10:35 AM  Estill Bamberg L. Tivis Ringer, [place] Office number [phone] Pager [phone]

## 2018-09-14 NOTE — Procedures (Signed)
Patient Name: [name]  MRN: [id number]  Epilepsy Attending: Lora Havens  Referring Physician/Provider: Dr Karena Addison Aroor Duration: 09/13/2018 1356 to 09/14/2018 1356  Patient history: 68 yo F with history of epilepsy who presented with breakthrough seizure. EEG to evaluate for seizures/status  Level of alertness: awake, lethargic  AEDs during EEG study: LEV  Technical aspects: This EEG study was done with scalp electrodes positioned according to the 10-20 International system of electrode placement. Electrical activity was acquired at a sampling rate of 500Hz  and reviewed with a high frequency filter of 70Hz  and a low frequency filter of 1Hz . EEG data were recorded continuously and digitally stored.   DESCRIPTION:  The posterior dominant rhythm consists of 8-9 Hz activity of moderate voltage (25-35 uV) seen predominantly in posterior head regions, symmetric and reactive to eye opening and eye closing. There were also 0.5Hz  PLEDs in left frontotemporal region initially which gradually improved and after around 2am on 09/14/2018 sharp waves were seen in left frontotemporal but were less frequent than before ( 3/15 seconds on avg).Twenty five seizures were recorded during this study, each lasting average 30 seconds during which either there were no clinical signs or patient appeared to be less responsive. Concomitant EEG showed seizure arising from left frontotemporal region with rhythmic high amplitude sharply contoured 4-5 Hz theta slowing which gradually increases in frequency and evolves to involve left hemisphere. Last seizure was on 09/14/2018 at 0143am. There was also generalized 3-5Hz  theta-delta slowing. Hyperventilation and photic stimulation were not performed.  IMPRESSION: This study initially showed status epilepticus ( avg 10seizures/hour) arising from left frontotemporal region as well as frequent sharp waves ( PLEDs) from the same region suggestive of acute/subacute  cortical dysfunction. EEG gradually improved with decreased frequency of seizures, (last seizure on 09/14/2018 at 0143am) and reduction in frequency of sharp waves. There was also evidence of moderate diffuse encephalopathy.  Treating provider was notified periodically while patient was in status epilepticus.

## 2018-09-14 NOTE — Progress Notes (Signed)
Reason for consult: Status Epilepticus  Subjective: No significant change in exam.   ROS: Unable to obtain due to aphasia  Examination  Vital signs in last 24 hours: Temp:  [98.2 F (36.8 C)-100.1 F (37.8 C)] 98.2 F (36.8 C) (08/23 0800) Pulse Rate:  [78-97] 86 (08/23 1100) Resp:  [14-26] 14 (08/23 1100) BP: (108-143)/(56-81) 135/66 (08/23 1100) SpO2:  [93 %-100 %] 97 % (08/23 1100) Weight:  [61.5 kg] 61.5 kg (08/23 0500)  General: lying in bed CVS: pulse-normal rate and rhythm RS: breathing comfortably Extremities: normal   Neuro: MS: Awake, not following commands, aphasic Cranial nerve exam: Pupils are equal and reactive no forced gaze deviation or nystagmus Motor exam,: Moving left upper and lower extremities purposefully.  Plegic in the right upper and lower extremity Gait deferred  Basic Metabolic Panel: Recent Labs  Lab 09/12/18 2020-05-14 09/12/18 May 15, 2146 09/13/18 0127 09/13/18 0130 09/14/18 0732  NA 137 140  --  137 139  K 5.2* 3.3*  --  4.6 3.4*  CL 104  --   --  104 103  CO2 13*  --   --  19* 24  GLUCOSE 256*  --   --  165* 86  BUN 17  --   --  13 8  CREATININE 1.28*  --   --  0.93 0.85  CALCIUM 8.7*  --   --  8.0* 8.3*  MG 2.0  --  1.7  --  1.8  PHOS  --   --  3.7  --   --     CBC: Recent Labs  Lab 09/12/18 2020-05-14 09/12/18 05-15-46 09/13/18 0127 09/14/18 0732  WBC 18.3*  --  16.1* 11.7*  NEUTROABS 7.0  --   --   --   HGB 14.0 12.9 13.9 15.1*  HCT 47.1* 38.0 43.0 45.7  MCV 92.7  --  86.3 85.1  PLT 385  --  262 271     Coagulation Studies: Recent Labs    09/12/18 May 14, 2020 09/14/18 0732  LABPROT 19.0* 20.2*  INR 1.6* 1.8*    Imaging Reviewed:     ASSESSMENT AND PLAN  68 year old female with large left MCA stroke and aphasia and right hemiparesis presents with seizures in the setting of noncompliance.    Patient was restarted on Keppra and did not have any further jerking. EEG obtained yesterday showed patient was in nonconvulsive status  epilepticus with multiple electrographic seizures recorded on routine EEG.  LTM EEG connected and patient was loaded with 1.5 g of Keppra at home dose was increased to 1.5 g twice daily. She continued to have electrographic seizures and was loaded with Dilantin and again loaded with Vimpat at 2 AM.  Patient has been seizure-free since addition of Vimpat.  Impression Nonconvulsive status epilepticus: Resolving Acute renal failure: Resolved  Recommendations Continue LTM EEG Continue Keppra 1.5 g twice daily Continue Vimpat 100 mg twice daily Continue Dilantin 100 mg 3 times daily, appreciate pharmacy assistance in maintenance dosing Continue to monitor kidney function, may need to adjust dosage if renal function becomes impaired again Okay to reinitiate Coumadin for stroke prevention, may need higher doses due to phenytoin.  Seizure precautions    I spent 35 minutes in the care of this neurologic critically ill patient who was in nonconvulsive status epilepticus.  The patient is neurologically critically ill and could to deteriorate due to worsening seizures, respiratory failure, aspiration, sepsis, cardiac arrest and other complications from ICU stay.  Her care included reviewing EEG, discussion  with epileptologist, complex decision making, reviewing vitals, labs, clinical exam and chart review.     Karena Addison Aroor [facility] Pager Number [phone] For questions after 7pm please refer to AMION to reach the Neurologist on call

## 2018-09-14 NOTE — Evaluation (Signed)
Physical Therapy Evaluation Patient Details Name: [name] MRN: [id number] DOB: [birth year] Today's Date: 09/14/2018   History of Present Illness  Patient is a 68 y/o female who presents with seizures in the setting of noncompliance. Head CT-unremarkable. Intubated to protect airway 8/21-8/22. PMH includes right hemiparesis, aphasia from prior stroke,HTN, HLD, CHF.  Clinical Impression  Patient presents with right sided hemiparesis, baseline aphasia, lethargy, impaired balance and impaired mobility s/p above. Per reports from prior admission and SLP (who spoke with spouse), pt able to ambulate with quad cane and do most of her ADls with only minimal assist. Today, pt requires Mod-max A of 2 for bed mobility and sitting EOB. Difficult to assess cognitive due to language deficits and arousal. Able to follow a few 1 step commands but not sure of accuracy of yes/no questions. RR increased to high 30s sitting EOB. Would benefit from SNF to maximize independence and mobility prior to return home. Will follow acutely.    Follow Up Recommendations SNF;Supervision for mobility/OOB;Supervision/Assistance - 24 hour    Equipment Recommendations  Other (comment)(defer)    Recommendations for Other Services       Precautions / Restrictions Precautions Precautions: Fall Precaution Comments: seizures; right hemi (baseline but worsened) Restrictions Weight Bearing Restrictions: No      Mobility  Bed Mobility Overal bed mobility: Needs Assistance Bed Mobility: Supine to Sit;Sit to Supine     Supine to sit: Mod assist;+2 for physical assistance;HOB elevated Sit to supine: Max assist;+2 for physical assistance;HOB elevated   General bed mobility comments: Assist to bring BLEs to EOB, scoot bottom and elevate trunk. Assist to bring LEs into bed and return to supine.  Transfers                 General transfer comment: Deferred secondary to arousal, weakness.  Ambulation/Gait                Stairs            Wheelchair Mobility    Modified Rankin (Stroke Patients Only) Modified Rankin (Stroke Patients Only) Pre-Morbid Rankin Score: Moderately severe disability Modified Rankin: Severe disability     Balance Overall balance assessment: Needs assistance Sitting-balance support: Feet supported;Single extremity supported Sitting balance-Leahy Scale: Poor Sitting balance - Comments: Requires Mod A for static sitting balance; attempting to lean forward with cues. Postural control: Posterior lean                                   Pertinent Vitals/Pain Pain Assessment: Faces Faces Pain Scale: No hurt    Home Living Family/patient expects to be discharged to:: Private residence Living Arrangements: Spouse/significant other Available Help at Discharge: Family Type of Home: House Home Access: Stairs to enter Entrance Stairs-Rails: None Entrance Stairs-Number of Steps: 1 Home Layout: One level Home Equipment: Cane - quad Additional Comments: Pt not able to provide information but home setup/info is taken from prior admission and also from SLP who spoke with spouse.    Prior Function Level of Independence: Needs assistance   Gait / Transfers Assistance Needed: Pt uses quad cane for ambulation PTA  ADL's / Homemaking Assistance Needed: Needs minimal help with ADLs (fastening her bra)        Hand Dominance        Extremity/Trunk Assessment   Upper Extremity Assessment Upper Extremity Assessment: RUE deficits/detail RUE Deficits / Details: Minimal shoulder movement noted otherwise flaccid. RUE  Sensation: decreased light touch    Lower Extremity Assessment Lower Extremity Assessment: RLE deficits/detail RLE Deficits / Details: Minimal movement in knee flexion but not consistently. RLE Sensation: WNL(per reports, accuracy?)       Communication   Communication: Expressive difficulties;Receptive difficulties  Cognition  Arousal/Alertness: Lethargic;Suspect due to medications(given seizure meds not to far before PT session) Behavior During Therapy: Flat affect Overall Cognitive Status: Difficult to assess                                 General Comments: Speech is not intelligible (hx of aphasia); able to answer yes/no to some questions. Not sure about reliability to yes/no questions. Pt very lethargic.  Follows very few 1 step commands this session. Attempts to point to make needs known. Perseverates on motor commands.      General Comments General comments (skin integrity, edema, etc.): Continuous EEG.    Exercises     Assessment/Plan    PT Assessment Patient needs continued PT services  PT Problem List Decreased strength;Decreased mobility;Impaired tone;Decreased balance;Impaired sensation;Decreased activity tolerance;Decreased cognition;Decreased range of motion;Decreased coordination       PT Treatment Interventions Therapeutic activities;Gait training;Therapeutic exercise;Patient/family education;Balance training;Neuromuscular re-education;Functional mobility training;DME instruction;Wheelchair mobility training    PT Goals (Current goals can be found in the Care Plan section)  Acute Rehab PT Goals Patient Stated Goal: pt unable to state due to aphasia PT Goal Formulation: Patient unable to participate in goal setting Time For Goal Achievement: 09/28/18 Potential to Achieve Goals: Fair    Frequency Min 3X/week   Barriers to discharge        Co-evaluation               AM-PAC PT "6 Clicks" Mobility  Outcome Measure Help needed turning from your back to your side while in a flat bed without using bedrails?: A Lot Help needed moving from lying on your back to sitting on the side of a flat bed without using bedrails?: A Lot Help needed moving to and from a bed to a chair (including a wheelchair)?: A Lot Help needed standing up from a chair using your arms (e.g.,  wheelchair or bedside chair)?: A Lot Help needed to walk in hospital room?: Total Help needed climbing 3-5 steps with a railing? : Total 6 Click Score: 10    End of Session   Activity Tolerance: Patient limited by lethargy Patient left: in bed;with call bell/phone within reach;with SCD's reapplied;with bed alarm set Nurse Communication: Mobility status PT Visit Diagnosis: Muscle weakness (generalized) (M62.81);Hemiplegia and hemiparesis Hemiplegia - Right/Left: Right Hemiplegia - dominant/non-dominant: Dominant Hemiplegia - caused by: Unspecified    Time: [id number] PT Time Calculation (min) (ACUTE ONLY): 17 min   Charges:   PT Evaluation $PT Eval Moderate Complexity: 1 Mod          Wray Kearns, PT, DPT Acute Rehabilitation Services Pager [phone] Office [place] 09/14/2018, 3:49 PM

## 2018-09-14 NOTE — Progress Notes (Signed)
Assisted tele visit to patient with family member.  Mervil Wacker M, RN  

## 2018-09-14 NOTE — Progress Notes (Signed)
NAME:  [name], MRN:  [id number], DOB:  02-16-1950, LOS: 2 ADMISSION DATE:  09/12/2018, CONSULTATION DATE: 09/12/2018 REFERRING MD: Emergency, CHIEF COMPLAINT: Seizure activity  Brief History   68 year old female with a history of seizure and CVA with right-sided hemi-plegia with witnessed CVA earlier today.  History of present illness   68 year old female with a history of seizure and CVA with right-sided hemi-plegia with witnessed CVA earlier today.  Patient was urgently intubated due to inability to protect airway.  Husband relates that she ran out of all her medications which she obtains by mail approximately 4 days ago when he been unable to obtain refills.  She had required assistance to the bathroom this afternoon which is unusual for her and he had left her sitting on the bed only to find her at the side of the bed tremulous apparently seizing. On my evaluation in the emergency room the patient has no overt seizure activity.  Neurology has evaluated her here.  She is ventilated initial blood gas somewhat acidotic.   Significant Hospital Events   NA  Consults:  Neurology  Procedures:  NA  Significant Diagnostic Tests:  Normal CT scan of the brain without acute changes  Micro Data:  NA  Antimicrobials:  Empiric vancomycin and cefepime x1  8/21  Interim history/subjective:  Extubated 8/22 Seizures stopped around 2 AM based on EEG monitoring; now on Keppra, Dilantin, Vimpat  Objective   Blood pressure 128/66, pulse 86, temperature 98.2 F (36.8 C), temperature source Oral, resp. rate 18, height 5\' 2"  (1.575 m), weight 61.5 kg, SpO2 94 %.    FiO2 (%):  [40 %] 40 %   Intake/Output Summary (Last 24 hours) at 09/14/2018 0930 Last data filed at 09/14/2018 0900 Gross per 24 hour  Intake 717.27 ml  Output 820 ml  Net -102.73 ml   Filed Weights   09/13/18 0000 09/13/18 0500 09/14/18 0500  Weight: 61.4 kg 61.4 kg 61.5 kg    Examination: General: Weak chronically  ill-appearing woman, no distress HENT: Poor dentition, oropharynx clear, moist.  Managing secretions adequately Lungs: Clear bilaterally, no crackles, no wheezing Cardiovascular: Regular, distant, no murmur Abdomen: Soft, nondistended, nontender, positive bowel sounds Extremities: No edema Neuro: Awake, eyes open, tracking.  She responds more quickly today but has expressive aphasia, one-word answers that are not to voice intelligible.  She does not move the right upper or lower extremity, foot drop present  Resolved Hospital Problem list   NA  Assessment & Plan:   Acute respiratory failure due to inability protect airway in the setting seizures -Continue good pulmonary hygiene -N.p.o. until she passes an SLP exam  History of seizure activity in the setting of prior CVA.  Unfortunately she had run out of her Keppra at home.  She had perceivable seizure activity on EEG until 2 AM -Currently on Keppra, Vimpat, Dilantin.  Final regimen to be determined by neurology when she is stabilized.  She was on Keppra alone at home -Continuous EEG monitoring as per neurology  Acute renal failure, resolved -Continue to follow urine output, BMP  Lactic acidosis in the setting of seizure activity, cleared on 8/22  Hypokalemia -Replace IV 8/23  Hypertension, probable diastolic dysfunction -Home carvedilol on hold, restart when blood pressure dictates, able to take p.o.  History of CVA, on chronic Coumadin for anticoagulation -Reinitiate Coumadin per pharmacy when able to take p.o.  Nutrition -Swallow evaluation on 8/23.  Initiate tube feeding if she does not pass    Best  practice:  Diet: np0 Pain/Anxiety/Delirium protocol (if indicated): N/A VAP protocol (if indicated): Completed DVT prophylaxis: yes GI prophylaxis: yes Glucose control:monitor Mobility: bed rest Code Status: full Family Communication: Discussed with husband 8/21  Disposition: icu  Labs   CBC: Recent Labs  Lab  09/12/18 2022 09/12/18 2148 09/13/18 0127 09/14/18 0732  WBC 18.3*  --  16.1* 11.7*  NEUTROABS 7.0  --   --   --   HGB 14.0 12.9 13.9 15.1*  HCT 47.1* 38.0 43.0 45.7  MCV 92.7  --  86.3 85.1  PLT 385  --  262 [id number]    Basic Metabolic Panel: Recent Labs  Lab 09/12/18 2022 09/12/18 2148 09/13/18 0127 09/13/18 0130 09/14/18 0732  NA 137 140  --  137 139  K 5.2* 3.3*  --  4.6 3.4*  CL 104  --   --  104 103  CO2 13*  --   --  19* 24  GLUCOSE 256*  --   --  165* 86  BUN 17  --   --  13 8  CREATININE 1.28*  --   --  0.93 0.85  CALCIUM 8.7*  --   --  8.0* 8.3*  MG 2.0  --  1.7  --  1.8  PHOS  --   --  3.7  --   --    GFR: Estimated Creatinine Clearance: 53.9 mL/min (by C-G formula based on SCr of 0.85 mg/dL). Recent Labs  Lab 09/12/18 2022 09/13/18 0127 09/13/18 0355 09/14/18 0732  WBC 18.3* 16.1*  --  11.7*  LATICACIDVEN 9.4* 2.8* 1.9  --     Liver Function Tests: Recent Labs  Lab 09/12/18 2022 09/13/18 0130  AST 62* 43*  ALT 21 22  ALKPHOS 115 98  BILITOT 1.0 0.7  PROT 7.5 6.9  ALBUMIN 3.9 3.6   No results for input(s): LIPASE, AMYLASE in the last 168 hours. No results for input(s): AMMONIA in the last 168 hours.  ABG    Component Value Date/Time   PHART 7.239 (L) 09/12/2018 2148   PCO2ART 47.8 09/12/2018 2148   PO2ART 141.0 (H) 09/12/2018 2148   HCO3 20.5 09/12/2018 2148   TCO2 22 09/12/2018 2148   ACIDBASEDEF 7.0 (H) 09/12/2018 2148   O2SAT 99.0 09/12/2018 2148     Coagulation Profile: Recent Labs  Lab 09/12/18 2022 09/14/18 0732  INR 1.6* 1.8*    Cardiac Enzymes: No results for input(s): CKTOTAL, CKMB, CKMBINDEX, TROPONINI in the last 168 hours.  HbA1C: Hemoglobin A1C  Date/Time Value Ref Range Status  08/15/2012 10:36 AM 5.3  Final   Hgb A1c MFr Bld  Date/Time Value Ref Range Status  09/13/2018 01:27 AM 5.9 (H) 4.8 - 5.6 % Final    Comment:    (NOTE) Pre diabetes:          5.7%-6.4% Diabetes:              >6.4% Glycemic control  for   <7.0% adults with diabetes   01/13/2008 09:18 AM  4.6 - 6.1 % Final   5.4 (NOTE)   The ADA recommends the following therapeutic goal for glycemic   control related to Hgb A1C measurement:   Goal of Therapy:   < 7.0% Hgb A1C   Reference: American Diabetes Association: Clinical Practice   Recommendations 2008, Diabetes Care,  2008, 31:(Suppl 1).    CBG: Recent Labs  Lab 09/13/18 1519 09/13/18 1937 09/13/18 2313 09/14/18 0336 09/14/18 0741  GLUCAP 111* 120* 130* 100*  82    Independent critical care time 32 minutes  Baltazar Apo, MD, PhD 09/14/2018, 9:30 AM [facility] [phone] or if no answer [phone]

## 2018-09-15 DIAGNOSIS — Z7901 Long term (current) use of anticoagulants: Secondary | ICD-10-CM

## 2018-09-15 DIAGNOSIS — I10 Essential (primary) hypertension: Secondary | ICD-10-CM

## 2018-09-15 LAB — PROTIME-INR
INR: 2 — ABNORMAL HIGH (ref 0.8–1.2)
Prothrombin Time: 22.2 seconds — ABNORMAL HIGH (ref 11.4–15.2)

## 2018-09-15 LAB — BASIC METABOLIC PANEL
Anion gap: 11 (ref 5–15)
BUN: 9 mg/dL (ref 8–23)
CO2: 24 mmol/L (ref 22–32)
Calcium: 8.4 mg/dL — ABNORMAL LOW (ref 8.9–10.3)
Chloride: 104 mmol/L (ref 98–111)
Creatinine, Ser: 0.82 mg/dL (ref 0.44–1.00)
GFR calc Af Amer: >60 mL/min (ref >60)
GFR calc non Af Amer: >60 mL/min (ref >60)
Glucose, Bld: 103 mg/dL — ABNORMAL HIGH (ref 70–99)
Potassium: 3.2 mmol/L — ABNORMAL LOW (ref 3.5–5.1)
Sodium: 139 mmol/L (ref 135–145)

## 2018-09-15 LAB — GLUCOSE, CAPILLARY
Glucose-Capillary: 104 mg/dL — ABNORMAL HIGH (ref 70–99)
Glucose-Capillary: 108 mg/dL — ABNORMAL HIGH (ref 70–99)
Glucose-Capillary: 109 mg/dL — ABNORMAL HIGH (ref 70–99)
Glucose-Capillary: 118 mg/dL — ABNORMAL HIGH (ref 70–99)
Glucose-Capillary: 135 mg/dL — ABNORMAL HIGH (ref 70–99)
Glucose-Capillary: 139 mg/dL — ABNORMAL HIGH (ref 70–99)
Glucose-Capillary: 168 mg/dL — ABNORMAL HIGH (ref 70–99)
Glucose-Capillary: 67 mg/dL — ABNORMAL LOW (ref 70–99)

## 2018-09-15 LAB — CBC
HCT: 41.1 % (ref 36.0–46.0)
Hemoglobin: 13.6 g/dL (ref 12.0–15.0)
MCH: 28 pg (ref 26.0–34.0)
MCHC: 33.1 g/dL (ref 30.0–36.0)
MCV: 84.6 fL (ref 80.0–100.0)
Platelets: 282 10*3/uL (ref 150–400)
RBC: 4.86 MIL/uL (ref 3.87–5.11)
RDW: 13.8 % (ref 11.5–15.5)
WBC: 10.8 10*3/uL — ABNORMAL HIGH (ref 4.0–10.5)
nRBC: 0 % (ref 0.0–0.2)

## 2018-09-15 LAB — PATHOLOGIST SMEAR REVIEW

## 2018-09-15 LAB — MAGNESIUM: Magnesium: 1.8 mg/dL (ref 1.7–2.4)

## 2018-09-15 LAB — PHOSPHORUS: Phosphorus: 1.7 mg/dL — ABNORMAL LOW (ref 2.5–4.6)

## 2018-09-15 MED ORDER — WARFARIN SODIUM 5 MG PO TABS
5.0000 mg | ORAL_TABLET | Freq: Once | ORAL | Status: AC
  Administered 2018-09-15: 5 mg via ORAL
  Filled 2018-09-15: qty 1

## 2018-09-15 MED ORDER — POTASSIUM PHOSPHATES 15 MMOLE/5ML IV SOLN
30.0000 mmol | Freq: Once | INTRAVENOUS | Status: AC
  Administered 2018-09-15: 30 mmol via INTRAVENOUS
  Filled 2018-09-15: qty 10

## 2018-09-15 NOTE — Progress Notes (Signed)
EEG maintenance done. No skin breakdown. Continue to monitor

## 2018-09-15 NOTE — Evaluation (Signed)
Occupational Therapy Evaluation Patient Details Name: [name] MRN: [id number] DOB: 25-Apr-1950 Today's Date: 09/15/2018    History of Present Illness Patient is a 68 y/o female who presents with seizures in the setting of noncompliance. Head CT-unremarkable. Intubated to protect airway 8/21-8/22. PMH includes right hemiparesis, aphasia from prior stroke,HTN, HLD, CHF.   Clinical Impression   This 68 yo female admitted with above presents to acute OT with decreased balance, decreased mobility, decreased awareness of deficits, decreased use of right side, and decreased vision thus affecting her safety and independence with basic ADLS. She will benefit from acute OT with follow up at SNF.    Follow Up Recommendations  SNF;Supervision/Assistance - 24 hour    Equipment Recommendations  None recommended by OT       Precautions / Restrictions Precautions Precautions: Fall Precaution Comments: seizures; right hemi (baseline but worsened) Restrictions Weight Bearing Restrictions: No      Mobility Bed Mobility Overal bed mobility: Needs Assistance Bed Mobility: Supine to Sit;Sit to Supine     Supine to sit: Max assist Sit to supine: Max assist;+2 for physical assistance   General bed mobility comments: Assist to bring BLEs to EOB, scoot bottom and elevate trunk. Assist to bring LEs and lay down upper body in a safe manner to return to supine.     Balance Overall balance assessment: Needs assistance Sitting-balance support: Feet supported;Single extremity supported Sitting balance-Leahy Scale: Poor Sitting balance - Comments: Required Mod A-min A for sitting balance--tendency for posterior lean Postural control: Posterior lean                                 ADL either performed or assessed with clinical judgement   ADL Overall ADL's : Needs assistance/impaired Eating/Feeding: Moderate assistance;Bed level   Grooming: Maximal assistance;Bed level   Upper  Body Bathing: Maximal assistance;Bed level   Lower Body Bathing: Total assistance;Bed level   Upper Body Dressing : Total assistance;Sitting   Lower Body Dressing: Total assistance;Bed level                       Vision   Additional Comments: Pt able to turn head left and right as well as eyes with increased cues/varied tasks            Pertinent Vitals/Pain Pain Assessment: No/denies pain     Hand Dominance Left(old CVA affects right side)   Extremity/Trunk Assessment Upper Extremity Assessment Upper Extremity Assessment: RUE deficits/detail RUE Deficits / Details: minimal movement, increased tone noted at wrist while seated EOB and at elbow and wrist in supine           Communication Communication Communication: Expressive difficulties;Receptive difficulties   Cognition Arousal/Alertness: Awake/alert Behavior During Therapy: WFL for tasks assessed/performed(some laughing and smiling) Overall Cognitive Status: Difficult to assess                                 General Comments: Speech is not intelligible (hx of aphasia); able to answer yes/no to some questions. Not sure about reliability to yes/no questions.  Follows most 1 step commands this session with some need to add gestures.              Home Living Family/patient expects to be discharged to:: Skilled nursing facility Living Arrangements: Spouse/significant other Available Help at Discharge: Family Type of Home:  House Home Access: Stairs to enter Technical brewer of Steps: 1 Entrance Stairs-Rails: None Home Layout: One level               Home Equipment: Cane - quad   Additional Comments: Pt not able to provide information but home setup/info is taken from prior admission and also from SLP who spoke with spouse.      Prior Functioning/Environment Level of Independence: Needs assistance  Gait / Transfers Assistance Needed: Pt uses quad cane for ambulation  PTA ADL's / Homemaking Assistance Needed: Needs minimal help with ADLs (fastening her bra)            OT Problem List: Decreased strength;Decreased range of motion;Impaired balance (sitting and/or standing);Impaired vision/perception;Impaired UE functional use;Impaired tone;Decreased cognition      OT Treatment/Interventions: Self-care/ADL training;DME and/or AE instruction;Patient/family education;Balance training;Therapeutic activities    OT Goals(Current goals can be found in the care plan section) Acute Rehab OT Goals Patient Stated Goal: pt unable to state due to aphasia OT Goal Formulation: Patient unable to participate in goal setting Time For Goal Achievement: 09/29/18 Potential to Achieve Goals: Fair  OT Frequency: Min 2X/week              AM-PAC OT "6 Clicks" Daily Activity     Outcome Measure Help from another person eating meals?: A Lot Help from another person taking care of personal grooming?: A Lot Help from another person toileting, which includes using toliet, bedpan, or urinal?: Total Help from another person bathing (including washing, rinsing, drying)?: A Lot Help from another person to put on and taking off regular upper body clothing?: A Lot Help from another person to put on and taking off regular lower body clothing?: Total 6 Click Score: 10   End of Session    Activity Tolerance: Patient tolerated treatment well Patient left: in bed;with call bell/phone within reach;with bed alarm set  OT Visit Diagnosis: Other abnormalities of gait and mobility (R26.89);Muscle weakness (generalized) (M62.81);Other symptoms and signs involving cognitive function;Hemiplegia and hemiparesis Hemiplegia - Right/Left: Right Hemiplegia - dominant/non-dominant: Dominant Hemiplegia - caused by: Cerebral infarction                Time: [id number] OT Time Calculation (min): 20 min Charges:  OT General Charges $OT Visit: 1 Visit OT Evaluation $OT Eval Moderate Complexity:  1 Mod  [address], OTR/L Acute [employer] Pager [phone] Office [phone]     Almon Register 09/15/2018, 4:04 PM

## 2018-09-15 NOTE — Progress Notes (Addendum)
Subjective: Resting comfortably in bed.   Objective: Current vital signs: BP (!) 156/79   Pulse 98   Temp 98.9 F (37.2 C) (Axillary)   Resp (!) 26   Ht 5\' 2"  (1.575 m)   Wt 61.5 kg   SpO2 99%   BMI 24.80 kg/m  Vital signs in last 24 hours: Temp:  [98.3 F (36.8 C)-99.2 F (37.3 C)] 98.9 F (37.2 C) (08/24 0800) Pulse Rate:  [25-112] 98 (08/24 1200) Resp:  [15-28] 26 (08/24 1200) BP: (126-179)/(62-89) 156/79 (08/24 1200) SpO2:  [94 %-99 %] 99 % (08/24 1200)  Intake/Output from previous day: 08/23 0701 - 08/24 0700 In: 1038.1 [P.O.:100; I.V.:368.1; IV Piggyback:569.9] Out: 1740 [Urine:1740] Intake/Output this shift: Total I/O In: 251.6 [I.V.:91.8; IV Piggyback:159.7] Out: 425 [Urine:425] Nutritional status:  Diet Order            DIET - DYS 1 Room service appropriate? No; Fluid consistency: Thin  Diet effective now             HEENT: [place]/AT Lungs: Respirations unlabored Ext: Warm and well perfused  Neurologic Exam: Ment: Dense expressive aphasia. Able to follow most simple verbal commands. Pleasant and cooperative.  CN: EOMI with some difficulty tracking to the right. Right facial droop.  Motor: RUE plegic with increased flexor tone and chronic contractures. RLE 4-/5 LUE and LLE 5/5 Reflexes: Pathologically brisk on the right Gait: Unable to assess  Lab Results: Results for orders placed or performed during the hospital encounter of 09/12/18 (from the past 48 hour(s))  Glucose, capillary     Status: Abnormal   Collection Time: 09/13/18  3:19 PM  Result Value Ref Range   Glucose-Capillary 111 (H) 70 - 99 mg/dL   Comment 1 Notify RN    Comment 2 Document in Chart   Glucose, capillary     Status: Abnormal   Collection Time: 09/13/18  7:37 PM  Result Value Ref Range   Glucose-Capillary 120 (H) 70 - 99 mg/dL  Glucose, capillary     Status: Abnormal   Collection Time: 09/13/18 11:13 PM  Result Value Ref Range   Glucose-Capillary 130 (H) 70 - 99 mg/dL   Glucose, capillary     Status: Abnormal   Collection Time: 09/14/18  3:36 AM  Result Value Ref Range   Glucose-Capillary 100 (H) 70 - 99 mg/dL  Basic metabolic panel     Status: Abnormal   Collection Time: 09/14/18  7:32 AM  Result Value Ref Range   Sodium 139 135 - 145 mmol/L   Potassium 3.4 (L) 3.5 - 5.1 mmol/L   Chloride 103 98 - 111 mmol/L   CO2 24 22 - 32 mmol/L   Glucose, Bld 86 70 - 99 mg/dL   BUN 8 8 - 23 mg/dL   Creatinine, Ser 0.85 0.44 - 1.00 mg/dL   Calcium 8.3 (L) 8.9 - 10.3 mg/dL   GFR calc non Af Amer >60 >60 mL/min   GFR calc Af Amer >60 >60 mL/min   Anion gap 12 5 - 15    Comment: Performed at [facility], [address] [place], [place] [address]  Magnesium     Status: None   Collection Time: 09/14/18  7:32 AM  Result Value Ref Range   Magnesium 1.8 1.7 - 2.4 mg/dL    Comment: Performed at [facility] [address] [place], [place] [address]  CBC     Status: Abnormal   Collection Time: 09/14/18  7:32 AM  Result Value Ref Range   WBC 11.7 (H) 4.0 - 10.5 K/uL   RBC 5.37 (H) 3.87 - 5.11 MIL/uL   Hemoglobin 15.1 (H) 12.0 - 15.0 g/dL   HCT 45.7 36.0 - 46.0 %   MCV 85.1 80.0 - 100.0 fL   MCH 28.1 26.0 - 34.0 pg   MCHC 33.0 30.0 - 36.0 g/dL   RDW 13.8 11.5 - 15.5 %   Platelets 271 150 - 400 K/uL   nRBC 0.0 0.0 - 0.2 %    Comment: Performed at [facility] [address], [place], [place] [address]  Protime-INR     Status: Abnormal   Collection Time: 09/14/18  7:32 AM  Result Value Ref Range   Prothrombin Time 20.2 (H) 11.4 - 15.2 seconds   INR 1.8 (H) 0.8 - 1.2    Comment: (NOTE) INR goal varies based on device and disease states. Performed at [facility], [place] [address], [place], [place] [address]   Triglycerides     Status: None   Collection Time: 09/14/18  7:32 AM  Result Value Ref Range   Triglycerides 82 <150 mg/dL    Comment: Performed at [facility] [address], [place], [place] [address]  Glucose,  capillary     Status: None   Collection Time: 09/14/18  7:41 AM  Result Value Ref Range   Glucose-Capillary 82 70 - 99 mg/dL   Comment 1 Notify RN    Comment 2 Document in Chart   Glucose, capillary     Status: Abnormal   Collection Time: 09/14/18 11:19 AM  Result Value Ref Range   Glucose-Capillary 108 (H) 70 - 99 mg/dL   Comment 1 Notify RN    Comment 2 Document in Chart   Glucose, capillary     Status: Abnormal   Collection Time: 09/14/18  3:24 PM  Result Value Ref Range   Glucose-Capillary 101 (H) 70 - 99 mg/dL   Comment 1 Notify RN    Comment 2 Document in Chart   Glucose, capillary     Status: Abnormal   Collection Time: 09/14/18  8:38 PM  Result Value Ref Range   Glucose-Capillary 132 (H) 70 - 99 mg/dL   Comment 1 Notify RN    Comment 2 Document in Chart   Glucose, capillary     Status: Abnormal   Collection Time: 09/14/18 11:58 PM  Result Value Ref Range   Glucose-Capillary 118 (H) 70 - 99 mg/dL   Comment 1 Notify RN    Comment 2 Document in Chart   Glucose, capillary     Status: Abnormal   Collection Time: 09/15/18  4:24 AM  Result Value Ref Range   Glucose-Capillary 108 (H) 70 - 99 mg/dL   Comment 1 Notify RN    Comment 2 Document in Chart   Protime-INR     Status: Abnormal   Collection Time: 09/15/18  5:21 AM  Result Value Ref Range   Prothrombin Time 22.2 (H) 11.4 - 15.2 seconds   INR 2.0 (H) 0.8 - 1.2    Comment: (NOTE) INR goal varies based on device and disease states. Performed at [facility], [place] [address], [place], [place] [id number]   Basic metabolic panel     Status: Abnormal   Collection Time: 09/15/18  5:21 AM  Result Value Ref Range   Sodium 139 135 - 145 mmol/L   Potassium 3.2 (L) 3.5 - 5.1 mmol/L   Chloride 104  98 - 111 mmol/L   CO2 24 22 - 32 mmol/L   Glucose, Bld 103 (H) 70 - 99 mg/dL   BUN 9 8 - 23 mg/dL   Creatinine, Ser 0.82 0.44 - 1.00 mg/dL   Calcium 8.4 (L) 8.9 - 10.3 mg/dL   GFR calc non Af Amer >60 >60 mL/min   GFR  calc Af Amer >60 >60 mL/min   Anion gap 11 5 - 15    Comment: Performed at [facility] [address], [place], [place] [address]  Magnesium     Status: None   Collection Time: 09/15/18  5:21 AM  Result Value Ref Range   Magnesium 1.8 1.7 - 2.4 mg/dL    Comment: Performed at [facility] [address] [place], [place] [address]  Phosphorus     Status: Abnormal   Collection Time: 09/15/18  5:21 AM  Result Value Ref Range   Phosphorus 1.7 (L) 2.5 - 4.6 mg/dL    Comment: Performed at [facility] [address], [place], [place] [address]  CBC     Status: Abnormal   Collection Time: 09/15/18  5:21 AM  Result Value Ref Range   WBC 10.8 (H) 4.0 - 10.5 K/uL   RBC 4.86 3.87 - 5.11 MIL/uL   Hemoglobin 13.6 12.0 - 15.0 g/dL   HCT 41.1 36.0 - 46.0 %   MCV 84.6 80.0 - 100.0 fL   MCH 28.0 26.0 - 34.0 pg   MCHC 33.1 30.0 - 36.0 g/dL   RDW 13.8 11.5 - 15.5 %   Platelets 282 150 - 400 K/uL   nRBC 0.0 0.0 - 0.2 %    Comment: Performed at [facility], [place] [address] [place], [place] [address]  Glucose, capillary     Status: Abnormal   Collection Time: 09/15/18  7:52 AM  Result Value Ref Range   Glucose-Capillary 104 (H) 70 - 99 mg/dL   Comment 1 Notify RN    Comment 2 Document in Chart   Glucose, capillary     Status: Abnormal   Collection Time: 09/15/18 11:30 AM  Result Value Ref Range   Glucose-Capillary 139 (H) 70 - 99 mg/dL   Comment 1 Notify RN    Comment 2 Document in Chart     Recent Results (from the past 240 hour(s))  SARS Coronavirus 2 [facility] order, Performed in [facility] hospital lab) Nasopharyngeal Nasopharyngeal Swab     Status: None   Collection Time: 09/12/18  8:44 PM   Specimen: Nasopharyngeal Swab  Result Value Ref Range Status   SARS Coronavirus 2 NEGATIVE NEGATIVE Final    Comment: (NOTE) If result is NEGATIVE SARS-CoV-2 target nucleic acids are NOT DETECTED. The SARS-CoV-2 RNA is generally detectable in upper and lower   respiratory specimens during the acute phase of infection. The lowest  concentration of SARS-CoV-2 viral copies this assay can detect is 250  copies / mL. A negative result does not preclude SARS-CoV-2 infection  and should not be used as the sole basis for treatment or other  patient management decisions.  A negative result may occur with  improper specimen collection / handling, submission of specimen other  than nasopharyngeal swab, presence of viral mutation(s) within the  areas targeted by this assay, and inadequate number of viral copies  (<250 copies / mL). A negative result must be combined with clinical  observations, patient history, and epidemiological information. If result is POSITIVE SARS-CoV-2 target nucleic  acids are DETECTED. The SARS-CoV-2 RNA is generally detectable in upper and lower  respiratory specimens dur ing the acute phase of infection.  Positive  results are indicative of active infection with SARS-CoV-2.  Clinical  correlation with patient history and other diagnostic information is  necessary to determine patient infection status.  Positive results do  not rule out bacterial infection or co-infection with other viruses. If result is PRESUMPTIVE POSTIVE SARS-CoV-2 nucleic acids MAY BE PRESENT.   A presumptive positive result was obtained on the submitted specimen  and confirmed on repeat testing.  While 2019 novel coronavirus  (SARS-CoV-2) nucleic acids may be present in the submitted sample  additional confirmatory testing may be necessary for epidemiological  and / or clinical management purposes  to differentiate between  SARS-CoV-2 and other Sarbecovirus currently known to infect humans.  If clinically indicated additional testing with an alternate test  methodology [phone]) is advised. The SARS-CoV-2 RNA is generally  detectable in upper and lower respiratory sp ecimens during the acute  phase of infection. The expected result is Negative. Fact  Sheet for Patients:  StrictlyIdeas.no Fact Sheet for Healthcare Providers: BankingDealers.co.za This test is not yet approved or cleared by the [place] FDA and has been authorized for detection and/or diagnosis of SARS-CoV-2 by FDA under an Emergency Use Authorization (EUA).  This EUA will remain in effect (meaning this test can be used) for the duration of the COVID-19 declaration under Section 564(b)(1) of the Act, 21 U.S.C. section 360bbb-3(b)(1), unless the authorization is terminated or revoked sooner. Performed at [facility], [place] [address] [place], [place] [address]   Blood culture (routine x 2)     Status: None (Preliminary result)   Collection Time: 09/12/18 11:05 PM   Specimen: BLOOD LEFT HAND  Result Value Ref Range Status   Specimen Description BLOOD LEFT HAND  Final   Special Requests   Final    BOTTLES DRAWN AEROBIC ONLY Blood Culture results may not be optimal due to an inadequate volume of blood received in culture bottles   Culture   Final    NO GROWTH 3 DAYS Performed at [facility], [place] [address], [place], [place] [address]    Report Status PENDING  Incomplete  MRSA PCR Screening     Status: None   Collection Time: 09/12/18 11:57 PM   Specimen: Nasal Mucosa; Nasopharyngeal  Result Value Ref Range Status   MRSA by PCR NEGATIVE NEGATIVE Final    Comment:        The GeneXpert MRSA Assay (FDA approved for NASAL specimens only), is one component of a comprehensive MRSA colonization surveillance program. It is not intended to diagnose MRSA infection nor to guide or monitor treatment for MRSA infections. Performed at [facility], [place] [address], [place], [place] [address]   Culture, blood (Routine X 2) w Reflex to ID Panel     Status: None (Preliminary result)   Collection Time: 09/13/18  1:27 AM   Specimen: BLOOD RIGHT HAND  Result Value Ref Range Status   Specimen Description BLOOD  RIGHT HAND  Final   Special Requests   Final    BOTTLES DRAWN AEROBIC ONLY Blood Culture results may not be optimal due to an inadequate volume of blood received in culture bottles   Culture   Final    NO GROWTH 2 DAYS Performed at [facility], [place] [address], [place], [place] [address]    Report Status PENDING  Incomplete    Lipid Panel Recent Labs    09/14/18 0732  TRIG 82    Studies/Results: Dg Chest Port 1 View  Result Date: 09/14/2018 CLINICAL DATA:  Acute respiratory failure. EXAM: PORTABLE CHEST 1 VIEW COMPARISON:  Chest radiograph 09/13/2018 FINDINGS: Stable enlarged cardiac and mediastinal contours. Elevation right hemidiaphragm. Limited exam due to overlapping hardware. Minimal bibasilar atelectasis. No large area pulmonary consolidation. No pleural effusion or pneumothorax. IMPRESSION: Improving bibasilar atelectasis. Exam limited due to overlapping hardware. Electronically Signed   By: Lovey Newcomer M.D.   On: 09/14/2018 09:18    Medications:  Scheduled: . Chlorhexidine Gluconate Cloth  6 each Topical Q0600  . insulin aspart  0-15 Units Subcutaneous Q4H  . pantoprazole (PROTONIX) IV  40 mg Intravenous QHS  . phenytoin (DILANTIN) IV  100 mg Intravenous Q8H  . sodium chloride flush  10-40 mL Intracatheter Q12H  . warfarin  5 mg Oral ONCE-1800  . Warfarin - Pharmacist Dosing Inpatient   Does not apply q1800   Continuous: . dextrose 5 % and 0.45% NaCl Stopped (09/15/18 1111)  . lacosamide (VIMPAT) IV Stopped (09/15/18 1020)  . lactated ringers Stopped (09/14/18 0957)  . levETIRAcetam Stopped (09/15/18 0125)  . potassium PHOSPHATE IVPB (in mmol) 85 mL/hr at 09/15/18 1200    LTM EEG report, 09/14/2018 at 1357 to  09/15/2018 at 1100: This day 2 of intensive EEG monitoring with simultaneous video monitoring did not record any clinical or subclinical seizures.  Background activities were abnormal for several reasons.  #1 background activity slowing across left  hemisphere particular left frontotemporal cortex suggestive of neuronal dysfunction on a structural vascular or degenerative basis in that region.  #2 left frontotemporal interictal epileptiform discharges and poorly formed periodic lateralized epileptiform discharges suggestive of cortical irritability in that region.  Continuous monitoring is recommended to ensure resolution of seizures.     Assessment: 68 year old female with history of large left MCA stroke and seizure disorder who presented with seizures in the setting of noncompliance. Keppra was restarted, but initial EEG showed NCSE. Anticonvulsant titration was initiated, with Keppra scheduled dose increased and addition of Dilantin and Vimpat to her regimen. She has been seizure-free since addition of Vimpat. 1. LTM EEG 8/23-8/24 shows no electrographic seizures. There is left hemispheric slowing as well as left frontotemporal interictal epileptiform discharges and poorly formed PLEDs suggestive of cortical irritability in that region.  2. Exam today reveals no seizure activity.   Recommendations: -- Continue LTM EEG -- Continue Keppra 1.5 g twice daily -- Continue Vimpat 100 mg twice daily -- Continue Dilantin 100 mg 3 times daily, appreciate pharmacy assistance in maintenance dosing -- Continue to monitor kidney function, may need to adjust dosage if renal function becomes impaired again -- Seizure precautions   LOS: 3 days   I spent 35 minutes in the care of this neurologic critically ill patient who initially presented in nonconvulsive status epilepticus. The patient is neurologically critically ill and could deteriorate due to worsening seizures, respiratory failure, aspiration, sepsis, cardiac arrest and other complications from ICU stay. Her care included reviewing EEG, complex decision making, reviewing vitals, labs, clinical exam and chart review   @Electronically  signed: Dr. Kerney Elbe 09/15/2018  1:15 PM

## 2018-09-15 NOTE — Progress Notes (Addendum)
NAME:  [name], MRN:  [id number], DOB:  1950/05/28, LOS: 3 ADMISSION DATE:  09/12/2018, CONSULTATION DATE: 09/12/2018 REFERRING MD: Emergency, CHIEF COMPLAINT: Seizure activity  Brief History   68 year old female with a history of seizure and CVA with right-sided hemi-plegia with witnessed seizure earlier today.  History of present illness   68 year old female with a history of seizure and CVA with right-sided hemi-plegia with witnessed seizure earlier today.  Patient was urgently intubated due to inability to protect airway.  Husband relates that she ran out of all her medications which she obtains by mail approximately 4 days ago when he been unable to obtain refills.  She had required assistance to the bathroom this afternoon which is unusual for her and he had left her sitting on the bed only to find her at the side of the bed tremulous apparently seizing. On my evaluation in the emergency room the patient has no overt seizure activity.  Neurology has evaluated her here.  She is ventilated initial blood gas somewhat acidotic.   Significant Hospital Events   NA  Consults:  Neurology  Procedures:  NA  Significant Diagnostic Tests:  Normal CT scan of the brain without acute changes  Micro Data:  NA  Antimicrobials:  Empiric vancomycin and cefepime x1  8/21  Interim history/subjective:  Doing well currently- denies new complaints.  Objective   Blood pressure (!) 150/64, pulse 92, temperature 98.9 F (37.2 C), temperature source Axillary, resp. rate 15, height 5\' 2"  (1.575 m), weight 61.5 kg, SpO2 98 %.        Intake/Output Summary (Last 24 hours) at 09/15/2018 0954 Last data filed at 09/15/2018 0800 Gross per 24 hour  Intake 1037.96 ml  Output 1740 ml  Net -702.04 ml   Filed Weights   09/13/18 0000 09/13/18 0500 09/14/18 0500  Weight: 61.4 kg 61.4 kg 61.5 kg    Examination: General: frail, chronically ill appearing elderly woman laying in bed in NAD HENT:  Fair dentition, moist mucous membranes.  Normocephalic, atraumatic. Lungs: CTAB, breathing comfortably on room air Cardiovascular: Regular rate and rhythm, no murmurs Abdomen: Soft, nondistended, nontender Extremities: No peripheral edema or cyanosis Neuro: Awake and alert, tracking around the room.  Attempting to talk, but dysarthric.  Asymmetric face with muscle wasting on the right and asymmetric smile.  Right upper extremity unable to move with wrist contracture.  Right lower extremity weaker than the left, but moving both spontaneously.  Resolved Hospital Problem list   NA  Assessment & Plan:   Acute respiratory failure due to inability protect airway in the setting of NCSE. Extubated 8/22. -Continue pulmonary hygiene increase mobility as able -Dysphagia 1 diet per speech; will monitor carefully for signs of aspiration  History of seizure activity in the setting of prior CVA.  Unfortunately she had run out of her Keppra at home.  She had perceivable seizure activity on EEG until 2 AM -Continue Keppra, Vimpat, Dilantin.  Continues to require EEG.  Currently on Keppra, Vimpat, Dilantin.  Final regimen to be determined by neurology when she is stabilized.  She was on Keppra alone at home  Acute renal failure, resolved -Continue to monitor urine output and electrolytes -Avoid nephrotoxic meds  Lactic acidosis in the setting of seizure activity, cleared on 8/22  Hypokalemia, hypophosphatemia -Replace IV 8/24 -Continue to monitor and replete PRN  Hypertension, probable diastolic dysfunction -Home Coreg on hold, restart if her blood pressure continues to rise  History of CVA, on chronic Coumadin for anticoagulation.  Unclear if history of Afib.  Discharge summary in 2013 indicates that warfarin was initiated for ventricular hypokinesis associated with HFrEF. -Appreciate pharmacy's assistance.  INR therapeutic today. -May need to revisit if this medication is still required.  Will  discuss with neurology.  Nutrition -Dysphagia 1 diet    Best practice:  Diet: Facial 1 Pain/Anxiety/Delirium protocol (if indicated): N/A VAP protocol (if indicated): Completed DVT prophylaxis: yes GI prophylaxis: yes Glucose control:monitor Mobility: bed rest Code Status: full Family Communication: Discussed with husband 8/21  Disposition: icu  Labs   CBC: Recent Labs  Lab 09/12/18 2022 09/12/18 2148 09/13/18 0127 09/14/18 0732 09/15/18 0521  WBC 18.3*  --  16.1* 11.7* 10.8*  NEUTROABS 7.0  --   --   --   --   HGB 14.0 12.9 13.9 15.1* 13.6  HCT 47.1* 38.0 43.0 45.7 41.1  MCV 92.7  --  86.3 85.1 84.6  PLT 385  --  262 271 [id number]    Basic Metabolic Panel: Recent Labs  Lab 09/12/18 2022 09/12/18 2148 09/13/18 0127 09/13/18 0130 09/14/18 0732 09/15/18 0521  NA 137 140  --  137 139 139  K 5.2* 3.3*  --  4.6 3.4* 3.2*  CL 104  --   --  104 103 104  CO2 13*  --   --  19* 24 24  GLUCOSE 256*  --   --  165* 86 103*  BUN 17  --   --  13 8 9   CREATININE 1.28*  --   --  0.93 0.85 0.82  CALCIUM 8.7*  --   --  8.0* 8.3* 8.4*  MG 2.0  --  1.7  --  1.8 1.8  PHOS  --   --  3.7  --   --  1.7*   GFR: Estimated Creatinine Clearance: 55.9 mL/min (by C-G formula based on SCr of 0.82 mg/dL). Recent Labs  Lab 09/12/18 2022 09/13/18 0127 09/13/18 0355 09/14/18 0732 09/15/18 0521  WBC 18.3* 16.1*  --  11.7* 10.8*  LATICACIDVEN 9.4* 2.8* 1.9  --   --     Liver Function Tests: Recent Labs  Lab 09/12/18 2022 09/13/18 0130  AST 62* 43*  ALT 21 22  ALKPHOS 115 98  BILITOT 1.0 0.7  PROT 7.5 6.9  ALBUMIN 3.9 3.6   No results for input(s): LIPASE, AMYLASE in the last 168 hours. No results for input(s): AMMONIA in the last 168 hours.  ABG    Component Value Date/Time   PHART 7.239 (L) 09/12/2018 2148   PCO2ART 47.8 09/12/2018 2148   PO2ART 141.0 (H) 09/12/2018 2148   HCO3 20.5 09/12/2018 2148   TCO2 22 09/12/2018 2148   ACIDBASEDEF 7.0 (H) 09/12/2018 2148    O2SAT 99.0 09/12/2018 2148     Coagulation Profile: Recent Labs  Lab 09/12/18 2022 09/14/18 0732 09/15/18 0521  INR 1.6* 1.8* 2.0*    Cardiac Enzymes: No results for input(s): CKTOTAL, CKMB, CKMBINDEX, TROPONINI in the last 168 hours.  HbA1C: Hemoglobin A1C  Date/Time Value Ref Range Status  08/15/2012 10:36 AM 5.3  Final   Hgb A1c MFr Bld  Date/Time Value Ref Range Status  09/13/2018 01:27 AM 5.9 (H) 4.8 - 5.6 % Final    Comment:    (NOTE) Pre diabetes:          5.7%-6.4% Diabetes:              >6.4% Glycemic control for   <7.0% adults with diabetes   01/13/2008  09:18 AM  4.6 - 6.1 % Final   5.4 (NOTE)   The ADA recommends the following therapeutic goal for glycemic   control related to Hgb A1C measurement:   Goal of Therapy:   < 7.0% Hgb A1C   Reference: American Diabetes Association: Clinical Practice   Recommendations 2008, Diabetes Care,  2008, 31:(Suppl 1).    CBG: Recent Labs  Lab 09/14/18 1524 09/14/18 2038 09/14/18 2358 09/15/18 0424 09/15/18 0752  GLUCAP 101* 132* 118* 108* 104*    This patient is critically ill with multiple organ system failure which requires frequent high complexity decision making, assessment, support, evaluation, and titration of therapies. This was completed through the application of advanced monitoring technologies and extensive interpretation of multiple databases. During this encounter critical care time was devoted to patient care services described in this note for 35 minutes.  Julian Hy, DO 09/15/18 10:19 AM [facility]   Mr. [name] was updated on his wife's care over the phone. He may come to the hospital to visit later today.  Julian Hy, DO 09/15/18 1:38 PM [facility]

## 2018-09-15 NOTE — Progress Notes (Signed)
[facility] Progress Note Patient Name: [name] DOB: 1950/10/03 MRN: [id number]   Date of Service  09/15/2018  HPI/Events of Note  Hypokalemia and Hypophosphatemia - K+ = 3.2, PO4--- = 1.7 and Creatinine = 0.82.   eICU Interventions  Will order: 1. Will replace K+ and PO4---.     Intervention Category Major Interventions: Electrolyte abnormality - evaluation and management  Elzora Cullins Eugene 09/15/2018, 6:54 AM

## 2018-09-15 NOTE — Progress Notes (Signed)
Assisted tele visit to patient with son.  Nature Vogelsang Ann, RN  

## 2018-09-15 NOTE — Progress Notes (Signed)
  Speech Language Pathology Treatment: Dysphagia  Patient Details Name: [name] MRN: [id number] DOB: 1950-11-06 Today's Date: 09/15/2018 Time: 1520-1530 SLP Time Calculation (min) (ACUTE ONLY): 10 min  Assessment / Plan / Recommendation Clinical Impression  Pt more alert, interactive with spontaneous voicing today.  Fed herself bfast and lunch per Therapist, sports.  No s/s of difficulty.  During our session, pt needed set-up with materials, then able to feed herself crackers and thin liquid with active mastication, mild anterior spilling of water with good self-monitoring, no s/s of aspiration.  Appears to be approaching baseline. Recommend advancing diet to dysphagia 3, thin liquids.  No further SLP f/u is warranted. Our service will sign off.   HPI HPI: 68 year old female with a history of seizures due to a previous left MCA infarct (2009 with residual aphasia and right hemiplegia) who presented 8/21 with breakthrough seizures in the setting of medication noncompliance (due to delays in refills). Intubated in ED, extubated 8/22.  Spoke with pt's husband by phone, who reports no difficulty with feeding herself, no swallowing deficits PTA.  Pt generally communicates with yes/no responses and gestures (communication boards have not been effective in the past - pt underwent speech therapy at [facility] speech clinic for several years.)       SLP Plan  All goals met       Recommendations  Diet recommendations: Dysphagia 3 (mechanical soft);Thin liquid Liquids provided via: Cup Medication Administration: Whole meds with puree Supervision: Patient able to self feed Postural Changes and/or Swallow Maneuvers: Seated upright 90 degrees                Oral Care Recommendations: Oral care BID Follow up Recommendations: None Plan: All goals met       GO              [name], [place] Office number [phone] Pager [phone]   Assunta Curtis 09/15/2018, 3:35 PM

## 2018-09-15 NOTE — Progress Notes (Signed)
eLink Physician-Brief Progress Note Patient Name: [name] DOB: [birth year] MRN: [id number]   Date of Service  09/15/2018  HPI/Events of Note  Hypoglycemia - Blood glucose = 67. Patient given OJ and repeat CBG = 168.   eICU Interventions  Will order: 1. Increase D5 0.45 NaCl IV infusion to 50 mL/hour.     Intervention Category Major Interventions: Other:  Lysle Dingwall 09/15/2018, 9:07 PM

## 2018-09-15 NOTE — Progress Notes (Addendum)
ANTICOAGULATION CONSULT NOTE  Pharmacy Consult for Warfarin Indication: history of stroke  No Known Allergies  Patient Measurements: Height: 5\' 2"  (157.5 cm) Weight: 135 lb 9.3 oz (61.5 kg) IBW/kg (Calculated) : 50.1  Vital Signs: Temp: 98.9 F (37.2 C) (08/24 0427) Temp Source: Oral (08/24 0427) BP: 150/64 (08/24 0800) Pulse Rate: 92 (08/24 0800)  Labs: Recent Labs    09/12/18 2022  09/13/18 0127 09/13/18 0129 09/13/18 0130 09/13/18 0355 09/13/18 0520 09/14/18 0732 09/15/18 0521  HGB 14.0   < > 13.9  --   --   --   --  15.1* 13.6  HCT 47.1*   < > 43.0  --   --   --   --  45.7 41.1  PLT 385  --  262  --   --   --   --  271 282  LABPROT 19.0*  --   --   --   --   --   --  20.2* 22.2*  INR 1.6*  --   --   --   --   --   --  1.8* 2.0*  CREATININE 1.28*  --   --   --  0.93  --   --  0.85 0.82  TROPONINIHS 68*  --   --  1,074*  --  1,322* 1,905*  --   --    < > = values in this interval not displayed.    Estimated Creatinine Clearance: 55.9 mL/min (by C-G formula based on SCr of 0.82 mg/dL).   Medical History: Past Medical History:  Diagnosis Date  . Adenomatous polyp of colon 09/2012   repeat colonoscopy in 5 years by Dr. Sharlett Iles  . CHF (congestive heart failure) (HCC)    EF 15-20% as of 05/28/11 (Dr Legrand [facility] D/[facility] summary)  . CVA (cerebral infarction)   . Hemiparesis ([place])   . Hyperlipidemia   . Hypertension   . Seizures ([place])   . Sickle cell anemia (HCC)   . Stroke ([employer])   . Vitamin D deficiency 2010    Assessment: 68 year old female admitted 8/21 after witnessed seizure at home. Patient had run out of Keppra medication for 4 days. Last dose of Warfarin was on 8/19 per husband. INR 1.6 >>1.8 (Phenytoin added, no Warfarin yet). H/H and Platelets are within normal limits. CT Head was negative for acute abnormality. CCM confirmed ok for warfarin restart and patient has now passed swallow evaluation.  PTA dosing: 5mg  daily  INR therapeutic at 2.0  s/p D#1 restart, CBC stable  Goal of Therapy:  INR 2-3 Monitor platelets by anticoagulation protocol: Yes   Plan:  Warfarin 5mg  PO x1 tonight Daily INR, s/s bleeding  Bertis Ruddy, PharmD Clinical Pharmacist Please check AMION for all [place] numbers 09/15/2018 8:30 AM

## 2018-09-15 NOTE — Progress Notes (Signed)
Hypoglycemic Event  CBG: 67  Treatment: Gave Orange juice with sugar to drinbkl  Symptoms: none  Follow-up CBG: Time:8:48 pm CBG Result:168  Possible Reasons for Event: poor intake  Comments/MD notified: eLink Md no new orders     [name]

## 2018-09-15 NOTE — Procedures (Signed)
Electroencephalogram.  Long-term monitoring  CPT 95720  Day 2  Recording begins 09/14/2018 at 1357 Recording ends 09/15/2018 at 11 00  Data acquisition: International 10-20 for electrode placement.  18 channels EEG with additional eyes linked to ipsilateral ears and EKG.  Additional T1-T2 electrodes were also used.  At this intensive EEG monitoring with simultaneous video monitoring was performed for this patient with status epilepticus to ensure resolution of seizures  No clinical or subclinical seizures present throughout the recording.  Background activities marked by obvious interhemispheric asymmetries.  Waking background activities tend to reach 7 to 8 cps across right hemisphere.  Continuous attenuated background activity slowing present across left hemisphere particularly left anterior frontal/temporal cortex.  Superimposed occasional left anterior temporal/frontal sharp waves and spikes present throughout the recording.  At times poorly formed blunted runs of left periodic lateralized epileptiform discharges present.  Clinical interpretation: This is day 2 of intensive EEG monitoring with simultaneous video monitoring did not record any clinical or subclinical seizures.  Background activities were abnormal for several reasons.  #1 background activity slowing across left hemisphere particular left frontotemporal cortex suggestive of neuronal dysfunction on a structural vascular or degenerative basis in that region.  #2 left frontotemporal interictal epileptiform discharges and poorly formed periodic lateralized epileptiform discharges suggestive of cortical irritability in that region.  Continuous monitoring is recommended to ensure resolution of seizures.  Clinical correlation is advised.

## 2018-09-15 NOTE — Plan of Care (Signed)

## 2018-09-16 DIAGNOSIS — R1310 Dysphagia, unspecified: Secondary | ICD-10-CM

## 2018-09-16 LAB — PROTIME-INR
INR: 2.6 — ABNORMAL HIGH (ref 0.8–1.2)
Prothrombin Time: 27.4 seconds — ABNORMAL HIGH (ref 11.4–15.2)

## 2018-09-16 LAB — GLUCOSE, CAPILLARY
Glucose-Capillary: 100 mg/dL — ABNORMAL HIGH (ref 70–99)
Glucose-Capillary: 112 mg/dL — ABNORMAL HIGH (ref 70–99)
Glucose-Capillary: 131 mg/dL — ABNORMAL HIGH (ref 70–99)
Glucose-Capillary: 154 mg/dL — ABNORMAL HIGH (ref 70–99)
Glucose-Capillary: 99 mg/dL (ref 70–99)
Glucose-Capillary: 99 mg/dL (ref 70–99)

## 2018-09-16 MED ORDER — CARVEDILOL 3.125 MG PO TABS
3.1250 mg | ORAL_TABLET | Freq: Two times a day (BID) | ORAL | Status: DC
  Administered 2018-09-16 – 2018-09-17 (×3): 3.125 mg via ORAL
  Filled 2018-09-16 (×3): qty 1

## 2018-09-16 MED ORDER — POTASSIUM CHLORIDE 20 MEQ/15ML (10%) PO SOLN
40.0000 meq | Freq: Two times a day (BID) | ORAL | Status: AC
  Administered 2018-09-16 (×2): 40 meq via ORAL
  Filled 2018-09-16 (×2): qty 30

## 2018-09-16 MED ORDER — WARFARIN SODIUM 2.5 MG PO TABS
2.5000 mg | ORAL_TABLET | Freq: Once | ORAL | Status: AC
  Administered 2018-09-16: 2.5 mg via ORAL
  Filled 2018-09-16: qty 1

## 2018-09-16 NOTE — Progress Notes (Signed)
[place] for Warfarin Indication: history of stroke  No Known Allergies  Patient Measurements: Height: 5\' 2"  (157.5 cm) Weight: 135 lb 10.7 oz (61.5 kg) IBW/kg (Calculated) : 50.1  Vital Signs: Temp: 97.8 F (36.6 C) (08/25 0400) Temp Source: Oral (08/25 0400) BP: 154/81 (08/25 0700) Pulse Rate: 97 (08/25 0700)  Labs: Recent Labs    09/14/18 0732 09/15/18 0521 09/16/18 0524  HGB 15.1* 13.6  --   HCT 45.7 41.1  --   PLT 271 282  --   LABPROT 20.2* 22.2* 27.4*  INR 1.8* 2.0* 2.6*  CREATININE 0.85 0.82  --     Estimated Creatinine Clearance: 55.9 mL/min (by C-G formula based on SCr of 0.82 mg/dL).   Medical History: Past Medical History:  Diagnosis Date  . Adenomatous polyp of colon 09/2012   repeat colonoscopy in 5 years by Dr. Sharlett Iles  . CHF (congestive heart failure) (HCC)    EF 15-20% as of 05/28/11 (Dr Legrand [facility] D/C summary)  . CVA (cerebral infarction)   . Hemiparesis ([place])   . Hyperlipidemia   . Hypertension   . Seizures ([place])   . Sickle cell anemia (HCC)   . Stroke ([place])   . Vitamin D deficiency 2010    Assessment: 68 year old female admitted 8/21 after witnessed seizure at home. Patient had run out of Keppra medication for 4 days. Last dose of Warfarin was on 8/19 per husband. INR 1.6 >>1.8 (Phenytoin added, no Warfarin yet). H/H and Platelets are within normal limits. CT Head was negative for acute abnormality. CCM confirmed ok for warfarin restart and patient has now passed swallow evaluation.  PTA dosing: 5mg  daily  INR therapeutic at 2.6 from 2.0 s/p D#3 restart.    Goal of Therapy:  INR 2-3 Monitor platelets by anticoagulation protocol: Yes   Plan:  Warfarin 2.5mg  PO x1 tonight Daily INR, s/s bleeding  Bertis Ruddy, PharmD Clinical Pharmacist Please check AMION for all [place] numbers 09/16/2018 8:14 AM

## 2018-09-16 NOTE — Progress Notes (Signed)
Physical Therapy Treatment Patient Details Name: [name] MRN: [id number] DOB: 1950-11-12 Today's Date: 09/16/2018    History of Present Illness Patient is a 68 y/o female who presents with seizures in the setting of noncompliance. Head CT-unremarkable. Intubated to protect airway 8/21-8/22. PMH includes right hemiparesis, aphasia from prior stroke,HTN, HLD, CHF.    PT Comments    Patient progressing slowly towards PT goals. Requires Mod A for bed mobility due to right sided hemiparesis and posterior bias. Requires Max A of 2 for standing with heavy posterior lean and extensor tone RLE making balance very challenging. Pt grasping and pulling on therapist's arms for support/balance. Difficulty following 1-2 step commands so slightly impulsive with mobility. Has baseline aphasia. Tolerated SPT from bed to chair with max A of 2. Pt is not close to functional baseline. Continue to recommend SNF to maximize independence and mobility prior to return home.   Follow Up Recommendations  SNF;Supervision for mobility/OOB;Supervision/Assistance - 24 hour     Equipment Recommendations  Other (comment)(defer)    Recommendations for Other Services       Precautions / Restrictions Precautions Precautions: Fall Precaution Comments: seizures; right hemi (baseline but worsened); extensor tone RLE Restrictions Weight Bearing Restrictions: No    Mobility  Bed Mobility Overal bed mobility: Needs Assistance Bed Mobility: Supine to Sit     Supine to sit: Mod assist     General bed mobility comments: Assist to scoot bottom to EOB and with trunk as pt with posterior bias. Not able to use RUE for assist.  Transfers Overall transfer level: Needs assistance Equipment used: 2 person hand held assist Transfers: Sit to/from [employer] Transfers Sit to Stand: Max assist;+2 physical assistance Stand pivot transfers: Max assist;+2 physical assistance       General transfer comment: Max A  of 2 to stand from EOB with pt pulling and grasping at therapist's arms, posterior bias and extensor tone noted in RLE with effort. Right foot going into inversion. Stood from [employer] x3. Max A of 2 for SPT towards left due to severe posterior lean. Not able to progress LEs.  Ambulation/Gait             General Gait Details: Unable   Stairs             Wheelchair Mobility    Modified Rankin (Stroke Patients Only) Modified Rankin (Stroke Patients Only) Pre-Morbid Rankin Score: Moderately severe disability Modified Rankin: Severe disability     Balance Overall balance assessment: Needs assistance Sitting-balance support: Feet supported;Single extremity supported Sitting balance-Leahy Scale: Poor Sitting balance - Comments: Required Mod A-min A for sitting balance--tendency for posterior lean and LOB backwards. Extensor tone RLE resulting in worsening posterior trunk lean and pt sliding forward. Postural control: Posterior lean Standing balance support: During functional activity Standing balance-Leahy Scale: Zero Standing balance comment: Max A of 2 to maintain standing balance due to posterior lean, BLEs not under BoS and weakness.                            Cognition Arousal/Alertness: Awake/alert Behavior During Therapy: WFL for tasks assessed/performed;Impulsive Overall Cognitive Status: Difficult to assess                                 General Comments: Speech is not intelligible (hx of aphasia); able to answer yes/no to some questions but not consistently. Not  sure about reliability to yes/no questions.  Follows most 1 step commands this session with some need to add gestures.      Exercises      General Comments General comments (skin integrity, edema, etc.): VSS.      Pertinent Vitals/Pain Pain Assessment: Faces Faces Pain Scale: No hurt    Home Living                      Prior Function            PT Goals  (current goals can now be found in the care plan section) Progress towards PT goals: Progressing toward goals    Frequency    Min 3X/week      PT Plan Current plan remains appropriate    Co-evaluation              AM-PAC PT "6 Clicks" Mobility   Outcome Measure  Help needed turning from your back to your side while in a flat bed without using bedrails?: A Little Help needed moving from lying on your back to sitting on the side of a flat bed without using bedrails?: A Lot Help needed moving to and from a bed to a chair (including a wheelchair)?: Total Help needed standing up from a chair using your arms (e.g., wheelchair or bedside chair)?: Total Help needed to walk in hospital room?: Total Help needed climbing 3-5 steps with a railing? : Total 6 Click Score: 9    End of Session Equipment Utilized During Treatment: Gait belt Activity Tolerance: Patient tolerated treatment well Patient left: in chair;with call bell/phone within reach;with chair alarm set Nurse Communication: Mobility status;Need for lift equipment(stedy?) PT Visit Diagnosis: Muscle weakness (generalized) (M62.81);Hemiplegia and hemiparesis Hemiplegia - Right/Left: Right Hemiplegia - dominant/non-dominant: Dominant Hemiplegia - caused by: Unspecified     Time: 1359-1420 PT Time Calculation (min) (ACUTE ONLY): 21 min  Charges:  $Therapeutic Activity: 8-22 mins                     Wray Kearns, PT, DPT Acute Rehabilitation Services Pager [phone] Office [place] 09/16/2018, 3:07 PM

## 2018-09-16 NOTE — Procedures (Signed)
Patient Name:[name] [id number] Epilepsy Attending:Keymarion Bearman [name] Referring Physician/Provider:Dr Karena Addison Aroor Duration: 09/15/2018 1100 to 09/16/2018 1218  Patient history:68 yo F with history of epilepsy who presented with breakthrough seizure. EEG to evaluate for seizures/status  Level of alertness:awake, lethargic  AEDs during EEG study:LEV, PHT, LCM  Technical aspects: This EEG study was done with scalp electrodes positioned according to the 10-20 International system of electrode placement. Electrical activity was acquired at a sampling rate of 500Hz  and reviewed with a high frequency filter of 70Hz  and a low frequency filter of 1Hz . EEG data were recorded continuously and digitally stored.  DESCRIPTION:  State Description Frequency Location Comments  Awake Posterior dominant rhythm 8-9 Posterior head regions, symmetric and reactive to eye opening and eye closing.   Sleep Vertex waves  Maximal frontocentral   Sleep Spindles 12-14  Maximal frontocentral   Sleep K complexes  Maximal frontocentral   Awake Continuous slow 3-5Hz  Left fronto temporal   Awake Spike 0.5Hz  Left frontotemporal At times rhythmic and PLED like   ABNORMALITY: 1.Spikes : left fronto temporal 2. Continuous slow: left frontotemporal  IMPRESSION: This study is suggestive of epileptogenicity in left frontotemporal region as well as cortical dysfunction in the same region likely secondary to acute/subacute underlying structural abnormality. NO seizures were seen throughout this recording. This study was significantly improved compared to earlier studies.   09/16/2018    09/13/2018     [name] [name]

## 2018-09-16 NOTE — Progress Notes (Addendum)
Reason for consult: Seizure  Subjective: No acute events overnight.  No new concerns. Per prior notes, at baseline patient is only able to say yes and no ROS: Unable to obtain due to expressive aphasia  Examination  Vital signs in last 24 hours: Temp:  [97.8 F (36.6 C)-99.4 F (37.4 C)] 99.2 F (37.3 C) (08/25 1200) Pulse Rate:  [94-110] 102 (08/25 1200) Resp:  [19-29] 24 (08/25 1200) BP: (131-173)/(75-98) 131/89 (08/25 1200) SpO2:  [96 %-100 %] 98 % (08/25 1200) Weight:  [61.5 kg] 61.5 kg (08/25 0403)  General: lying in bed, not in apparent distress, smiling and waving CVS: pulse-normal rate and rhythm RS: breathing comfortably Extremities: normal, warm  Neuro: MS: Alert, follows simple one-step commands but not consistently, will continue speech but not very clear, persistent expressive aphasia CN: pupils equal and reactive,  EOMI, face symmetric, tongue midline, normal sensation over face, Motor: Right upper extremity plegic with increased flexor tone and chronic contractures, right lower extremity 4/5, 5/5 in left upper and lower extremity Reflexes: Brisk in the right upper and lower extremity, 2+ left upper and lower extremity Coordination: Unable to assess as patient was unable to follow complex commands Gait: not tested  Basic Metabolic Panel: Recent Labs  Lab 09/12/18 2022 09/12/18 2148 09/13/18 0127 09/13/18 0130 09/14/18 0732 09/15/18 0521  NA 137 140  --  137 139 139  K 5.2* 3.3*  --  4.6 3.4* 3.2*  CL 104  --   --  104 103 104  CO2 13*  --   --  19* 24 24  GLUCOSE 256*  --   --  165* 86 103*  BUN 17  --   --  13 8 9   CREATININE 1.28*  --   --  0.93 0.85 0.82  CALCIUM 8.7*  --   --  8.0* 8.3* 8.4*  MG 2.0  --  1.7  --  1.8 1.8  PHOS  --   --  3.7  --   --  1.7*    CBC: Recent Labs  Lab 09/12/18 2022 09/12/18 2148 09/13/18 0127 09/14/18 0732 09/15/18 0521  WBC 18.3*  --  16.1* 11.7* 10.8*  NEUTROABS 7.0  --   --   --   --   HGB 14.0 12.9 13.9  15.1* 13.6  HCT 47.1* 38.0 43.0 45.7 41.1  MCV 92.7  --  86.3 85.1 84.6  PLT 385  --  262 271 282     Coagulation Studies: Recent Labs    09/14/18 0732 09/15/18 0521 09/16/18 0524  LABPROT 20.2* 22.2* 27.4*  INR 1.8* 2.0* 2.6*   Imaging: CT head without contrast 09/12/2018: No acute abnormality.  Remote left MCA distribution infarct.  Assessment: 68 year old female with history of large left MCA stroke and seizure disorder who presented with seizures in the setting of noncompliance. Keppra was restarted, but initial EEG showed NCSE. Anticonvulsant titration was initiated, with Keppra scheduled dose increased and addition of Dilantin and Vimpat to her regimen. She has been seizure-free since addition of Vimpat.   Status epilepticus (resolved) Focal epilepsy, left frontal temporal Breakthrough seizure Expressive aphasia  -No further seizures on LTM.  Last seizure on 09/14/2018 at 1:43 AM. -Etiology for status epilepticus and breakthrough seizure is medication noncompliance due to running out of prescription  Recommendations: --As patient has been seizure-free for over 24 hours and clinically improving, we will discontinue LTM EEG -- Continue Keppra 1.5 g twice daily -- Continue Vimpat 100 mg  twice daily -- Continue Dilantin 100 mg 3 times daily, appreciate pharmacy assistance in maintenance dosing -- Continue to monitor kidney function,may need to adjust dosage if renal function becomes impaired again --Patient will likely not need 3 antiseizure medications long-term.  However, will defer to outpatient neurologist to taper off additional AEDs slowly as needed -- Seizure precautions --As needed IV Ativan 2 mg for generalized tonic-clonic seizure lasting more than 3 minutes while inpatient --Follow-up with outpatient neurologist/epileptologist in 4 to 6 weeks after discharge.

## 2018-09-16 NOTE — ED Provider Notes (Signed)
Assisted tele visit to patient with family member.  Markeya Mincy R, RN  

## 2018-09-16 NOTE — Progress Notes (Signed)
NAME:  [name], MRN:  [id number], DOB:  [birth year], LOS: 4 ADMISSION DATE:  09/12/2018, CONSULTATION DATE: 09/12/2018 REFERRING MD: Emergency, CHIEF COMPLAINT: Seizure activity  Brief History   68 year old female with a history of seizure and CVA with right-sided hemi-plegia with witnessed seizure earlier today.  History of present illness   68 year old female with a history of seizure and CVA with right-sided hemi-plegia with witnessed seizure earlier today.  Patient was urgently intubated due to inability to protect airway.  Husband relates that she ran out of all her medications which she obtains by mail approximately 4 days ago when he been unable to obtain refills.  She had required assistance to the bathroom this afternoon which is unusual for her and he had left her sitting on the bed only to find her at the side of the bed tremulous apparently seizing. On my evaluation in the emergency room the patient has no overt seizure activity.  Neurology has evaluated her here.  She is ventilated initial blood gas somewhat acidotic.   Significant Hospital Events   NA  Consults:  Neurology  Procedures:  NA  Significant Diagnostic Tests:  Normal CT scan of the brain without acute changes  Micro Data:  NA  Antimicrobials:  Empiric vancomycin and cefepime x1  8/21  Interim history/subjective:  Doing well currently- States she is feeling good with head nod Follows commands, answers questions as able T Max 99.4 WBC 13.6 Net negative 1800 cc  Objective   Blood pressure 135/89, pulse 95, temperature 98.8 F (37.1 C), temperature source Oral, resp. rate (!) 25, height 5\' 2"  (1.575 m), weight 61.5 kg, SpO2 97 %.        Intake/Output Summary (Last 24 hours) at 09/16/2018 0935 Last data filed at 09/16/2018 0600 Gross per 24 hour  Intake 1169.73 ml  Output 2605 ml  Net -1435.27 ml   Filed Weights   09/13/18 0500 09/14/18 0500 09/16/18 0403  Weight: 61.4 kg 61.5 kg 61.5 kg    Examination: General: frail, chronically ill appearing elderly woman sitting up  in bed in NAD, eating breakfast HENT: Fair dentition, moist mucous membranes, No LAD.  Normocephalic, atraumatic. Lungs: Bilateral chest excursion, CTAB, breathing comfortably on room air, sats are 97% per tele Cardiovascular:S1, S2, RRR, no RMG, ST per tele Abdomen: Soft, nondistended, nontender, BS+ Extremities: No peripheral edema or cyanosis, no obvious deformities noted Neuro: Awake and alert, tracking around the room.  Attempting to talk, but dysarthric.  Asymmetric face with muscle wasting on the right and asymmetric smile.  Right upper extremity unable to move with wrist contracture.  Right lower extremity weaker than the left, but moving both spontaneously. Able to fed herself and communicated well with nodding/ shaking head   Resolved Hospital Problem list   NA  Assessment & Plan:   Acute respiratory failure due to inability protect airway in the setting of NCSE. Extubated 8/22. -Continue pulmonary hygiene increase mobility as able -Dysphagia 1 diet per speech; will monitor carefully for signs of aspiration - CXR prn  History of seizure activity in the setting of prior CVA.  Unfortunately she had run out of her Keppra at home.  She had perceivable seizure activity on EEG until 2 AM -Continue Keppra, Vimpat, Dilantin.   Continues to require continuous per EEG.  Currently on Keppra, Vimpat, Dilantin.   Final regimen to be determined by neurology when she is stabilized.  She was on Keppra alone at home Should be able to transition out of  ICU when Continuous EEG is discontinued Consider case management to ensure she does not run out of anti seizure medications in the future   Acute renal failure, resolved -Continue to monitor urine output and electrolytes -Avoid nephrotoxic meds - Maintain renal perfusion  Lactic acidosis in the setting of seizure activity, cleared on 8/22  Hypokalemia,  hypophosphatemia -Replace IV 8/24 -Continue to monitor and replete PRN  Hypertension, probable diastolic dysfunction - BP is up trending.  - Will add home Coreg back at 3.125 BID and increase to home dose if she tolerates well  History of CVA, on chronic Coumadin for anticoagulation. Unclear if history of Afib.  Discharge summary in 2013 indicates that warfarin was initiated for ventricular hypokinesis associated with HFrEF. -Appreciate pharmacy's assistance.  INR therapeutic today. -May need to revisit if this medication is still required.  Neurology input appreciated  Nutrition - Dysphagia 1 diet - Aspiration precautions  If able to discontinue continuous EEG, consider  transitioning to progressive bed and hospice  Best practice:  Diet: Dysphasia 1 Pain/Anxiety/Delirium protocol (if indicated): N/A VAP protocol (if indicated): Completed DVT prophylaxis: yes GI prophylaxis: yes Glucose control:monitor Mobility: bed rest Code Status: full Family Communication: Discussed with husband 8/25  Disposition: icu  Labs   CBC: Recent Labs  Lab 09/12/18 2022 09/12/18 2148 09/13/18 0127 09/14/18 0732 09/15/18 0521  WBC 18.3*  --  16.1* 11.7* 10.8*  NEUTROABS 7.0  --   --   --   --   HGB 14.0 12.9 13.9 15.1* 13.6  HCT 47.1* 38.0 43.0 45.7 41.1  MCV 92.7  --  86.3 85.1 84.6  PLT 385  --  262 271 [id number]    Basic Metabolic Panel: Recent Labs  Lab 09/12/18 2022 09/12/18 2148 09/13/18 0127 09/13/18 0130 09/14/18 0732 09/15/18 0521  NA 137 140  --  137 139 139  K 5.2* 3.3*  --  4.6 3.4* 3.2*  CL 104  --   --  104 103 104  CO2 13*  --   --  19* 24 24  GLUCOSE 256*  --   --  165* 86 103*  BUN 17  --   --  13 8 9   CREATININE 1.28*  --   --  0.93 0.85 0.82  CALCIUM 8.7*  --   --  8.0* 8.3* 8.4*  MG 2.0  --  1.7  --  1.8 1.8  PHOS  --   --  3.7  --   --  1.7*   GFR: Estimated Creatinine Clearance: 55.9 mL/min (by C-G formula based on SCr of 0.82 mg/dL). Recent Labs  Lab  09/12/18 2022 09/13/18 0127 09/13/18 0355 09/14/18 0732 09/15/18 0521  WBC 18.3* 16.1*  --  11.7* 10.8*  LATICACIDVEN 9.4* 2.8* 1.9  --   --     Liver Function Tests: Recent Labs  Lab 09/12/18 2022 09/13/18 0130  AST 62* 43*  ALT 21 22  ALKPHOS 115 98  BILITOT 1.0 0.7  PROT 7.5 6.9  ALBUMIN 3.9 3.6   No results for input(s): LIPASE, AMYLASE in the last 168 hours. No results for input(s): AMMONIA in the last 168 hours.  ABG    Component Value Date/Time   PHART 7.239 (L) 09/12/2018 2148   PCO2ART 47.8 09/12/2018 2148   PO2ART 141.0 (H) 09/12/2018 2148   HCO3 20.5 09/12/2018 2148   TCO2 22 09/12/2018 2148   ACIDBASEDEF 7.0 (H) 09/12/2018 2148   O2SAT 99.0 09/12/2018 2148     Coagulation  Profile: Recent Labs  Lab 09/12/18 2022 09/14/18 0732 09/15/18 0521 09/16/18 0524  INR 1.6* 1.8* 2.0* 2.6*    Cardiac Enzymes: No results for input(s): CKTOTAL, CKMB, CKMBINDEX, TROPONINI in the last 168 hours.  HbA1C: Hemoglobin A1C  Date/Time Value Ref Range Status  08/15/2012 10:36 AM 5.3  Final   Hgb A1c MFr Bld  Date/Time Value Ref Range Status  09/13/2018 01:27 AM 5.9 (H) 4.8 - 5.6 % Final    Comment:    (NOTE) Pre diabetes:          5.7%-6.4% Diabetes:              >6.4% Glycemic control for   <7.0% adults with diabetes   01/13/2008 09:18 AM  4.6 - 6.1 % Final   5.4 (NOTE)   The ADA recommends the following therapeutic goal for glycemic   control related to Hgb A1C measurement:   Goal of Therapy:   < 7.0% Hgb A1C   Reference: American Diabetes Association: Clinical Practice   Recommendations 2008, Diabetes Care,  2008, 31:(Suppl 1).    CBG: Recent Labs  Lab 09/15/18 2007 09/15/18 2046 09/15/18 2340 09/16/18 0334 09/16/18 0738  GLUCAP 67* 168* 109* 99 100*      Magdalen Spatz, AGACNP-BC [place] Pager # [phone] After 4 pm please call [phone]  09/16/18 9:35 AM

## 2018-09-16 NOTE — Progress Notes (Signed)
LTM EEG discontinued - no skin breakdown at unhook.   

## 2018-09-17 DIAGNOSIS — G40901 Epilepsy, unspecified, not intractable, with status epilepticus: Secondary | ICD-10-CM

## 2018-09-17 LAB — CBC
HCT: 42.3 % (ref 36.0–46.0)
Hemoglobin: 13.9 g/dL (ref 12.0–15.0)
MCH: 27.7 pg (ref 26.0–34.0)
MCHC: 32.9 g/dL (ref 30.0–36.0)
MCV: 84.4 fL (ref 80.0–100.0)
Platelets: 333 10*3/uL (ref 150–400)
RBC: 5.01 MIL/uL (ref 3.87–5.11)
RDW: 13.9 % (ref 11.5–15.5)
WBC: 9.6 10*3/uL (ref 4.0–10.5)
nRBC: 0 % (ref 0.0–0.2)

## 2018-09-17 LAB — GLUCOSE, CAPILLARY
Glucose-Capillary: 98 mg/dL (ref 70–99)
Glucose-Capillary: 108 mg/dL — ABNORMAL HIGH (ref 70–99)
Glucose-Capillary: 113 mg/dL — ABNORMAL HIGH (ref 70–99)
Glucose-Capillary: 110 mg/dL — ABNORMAL HIGH (ref 70–99)
Glucose-Capillary: 163 mg/dL — ABNORMAL HIGH (ref 70–99)

## 2018-09-17 LAB — BASIC METABOLIC PANEL
Anion gap: 8 (ref 5–15)
BUN: 12 mg/dL (ref 8–23)
CO2: 23 mmol/L (ref 22–32)
Calcium: 8.6 mg/dL — ABNORMAL LOW (ref 8.9–10.3)
Chloride: 107 mmol/L (ref 98–111)
Creatinine, Ser: 0.93 mg/dL (ref 0.44–1.00)
GFR calc Af Amer: >60 mL/min (ref >60)
GFR calc non Af Amer: >60 mL/min (ref >60)
Glucose, Bld: 100 mg/dL — ABNORMAL HIGH (ref 70–99)
Potassium: 3.9 mmol/L (ref 3.5–5.1)
Sodium: 138 mmol/L (ref 135–145)

## 2018-09-17 LAB — PROTIME-INR
INR: 2.8 — ABNORMAL HIGH (ref 0.8–1.2)
Prothrombin Time: 29.2 seconds — ABNORMAL HIGH (ref 11.4–15.2)

## 2018-09-17 LAB — CULTURE, BLOOD (ROUTINE X 2): Culture: NO GROWTH

## 2018-09-17 LAB — PHOSPHORUS: Phosphorus: 3.2 mg/dL (ref 2.5–4.6)

## 2018-09-17 LAB — MAGNESIUM: Magnesium: 1.8 mg/dL (ref 1.7–2.4)

## 2018-09-17 LAB — PHENYTOIN LEVEL, TOTAL: Phenytoin Lvl: 22.7 ug/mL — ABNORMAL HIGH (ref 10.0–20.0)

## 2018-09-17 MED ORDER — PHENYTOIN 125 MG/5ML PO SUSP: 30.0000 mg | Freq: Every morning | ORAL | Status: DC

## 2018-09-17 MED ORDER — PHENYTOIN 125 MG/5ML PO SUSP: 30.0000 mg | Freq: Every day | ORAL | Status: DC

## 2018-09-17 MED ORDER — CARVEDILOL 6.25 MG PO TABS
6.2500 mg | ORAL_TABLET | Freq: Two times a day (BID) | ORAL | Status: DC
  Administered 2018-09-17 – 2018-09-20 (×6): 6.25 mg via ORAL
  Filled 2018-09-17 (×6): qty 1

## 2018-09-17 MED ORDER — SODIUM CHLORIDE 0.9 % IV SOLN
125.0000 mg | Freq: Every morning | INTRAVENOUS | Status: AC
  Filled 2018-09-17: qty 2.5

## 2018-09-17 MED ORDER — PHENYTOIN 125 MG/5ML PO SUSP
60.0000 mg | Freq: Every day | ORAL | Status: DC
  Administered 2018-09-17 – 2018-09-19 (×3): 60 mg via ORAL
  Filled 2018-09-17 (×6): qty 4

## 2018-09-17 MED ORDER — SODIUM CHLORIDE 0.9 % IV SOLN
125.0000 mg | Freq: Two times a day (BID) | INTRAVENOUS | Status: DC
  Filled 2018-09-17: qty 2.5

## 2018-09-17 MED ORDER — WARFARIN SODIUM 5 MG PO TABS
5.0000 mg | ORAL_TABLET | Freq: Once | ORAL | Status: AC
  Administered 2018-09-17: 5 mg via ORAL
  Filled 2018-09-17: qty 1

## 2018-09-17 MED ORDER — PHENYTOIN 125 MG/5ML PO SUSP
60.0000 mg | Freq: Every morning | ORAL | Status: DC
  Administered 2018-09-19 – 2018-09-20 (×2): 60 mg via ORAL
  Filled 2018-09-17 (×2): qty 4

## 2018-09-17 NOTE — Progress Notes (Signed)
Physical Therapy Treatment Patient Details Name: [name] MRN: [id number] DOB: February 01, 1950 Today's Date: 09/17/2018    History of Present Illness Patient is a 68 y/o female who presents with seizures in the setting of noncompliance. Head CT-unremarkable. Intubated to protect airway 8/21-8/22. PMH includes right hemiparesis, aphasia from prior stroke,HTN, HLD, CHF.    PT Comments    Pt with decreased RLE extensor tone noted today, requiring two person moderate assist for stand pivot towards left (stronger) side. Pt responding yes/no inconsistently to questions (tendency to respond "yes" to almost every question), but following simple motor commands with gestural cues. Continue to recommend SNF for ongoing Physical Therapy.      Follow Up Recommendations  SNF;Supervision for mobility/OOB;Supervision/Assistance - 24 hour     Equipment Recommendations  Other (comment)(defer)    Recommendations for Other Services       Precautions / Restrictions Precautions Precautions: Fall Precaution Comments: seizures; right hemi (baseline but worsened); extensor tone RLE Restrictions Weight Bearing Restrictions: No    Mobility  Bed Mobility Overal bed mobility: Needs Assistance Bed Mobility: Supine to Sit     Supine to sit: Min assist     General bed mobility comments: Pt bringing LLE over and assisting RLE over with L arm. Difficulty with sitting up and reciprocal scooting noted to get edge of bed  Transfers Overall transfer level: Needs assistance Equipment used: 2 person hand held assist Transfers: Sit to/from [facility] Sit to Stand: Mod assist;+2 physical assistance Stand pivot transfers: Mod assist;+2 physical assistance       General transfer comment: ModA + 2 to stand with right knee blocked, pt reaching for chair arm to pivot over towards left (stronger) side  Ambulation/Gait                 Stairs             Wheelchair Mobility     Modified Rankin (Stroke Patients Only) Modified Rankin (Stroke Patients Only) Pre-Morbid Rankin Score: Moderately severe disability Modified Rankin: Severe disability     Balance Overall balance assessment: Needs assistance Sitting-balance support: Feet supported;Single extremity supported Sitting balance-Leahy Scale: Fair     Standing balance support: During functional activity Standing balance-Leahy Scale: Zero                              Cognition Arousal/Alertness: Awake/alert Behavior During Therapy: WFL for tasks assessed/performed;Impulsive Overall Cognitive Status: Difficult to assess                                 General Comments: Unintelligible speech, inconsistent yes/no responses. Follows motor commands and gestural cues      Exercises General Exercises - Lower Extremity Long Arc Quad: Both;15 reps;Seated    General Comments        Pertinent Vitals/Pain Pain Assessment: Faces Faces Pain Scale: No hurt    Home Living                      Prior Function            PT Goals (current goals can now be found in the care plan section) Acute Rehab PT Goals Patient Stated Goal: pt unable to state due to aphasia Potential to Achieve Goals: Fair Progress towards PT goals: Progressing toward goals    Frequency    Min 3X/week  PT Plan Current plan remains appropriate    Co-evaluation              AM-PAC PT "6 Clicks" Mobility   Outcome Measure  Help needed turning from your back to your side while in a flat bed without using bedrails?: A Little Help needed moving from lying on your back to sitting on the side of a flat bed without using bedrails?: A Lot Help needed moving to and from a bed to a chair (including a wheelchair)?: Total Help needed standing up from a chair using your arms (e.g., wheelchair or bedside chair)?: Total Help needed to walk in hospital room?: Total Help needed climbing 3-5  steps with a railing? : Total 6 Click Score: 9    End of Session Equipment Utilized During Treatment: Gait belt Activity Tolerance: Patient tolerated treatment well Patient left: in chair;with call bell/phone within reach;with chair alarm set Nurse Communication: Mobility status PT Visit Diagnosis: Muscle weakness (generalized) (M62.81);Hemiplegia and hemiparesis Hemiplegia - Right/Left: Right Hemiplegia - dominant/non-dominant: Dominant Hemiplegia - caused by: Unspecified     Time: [id number] PT Time Calculation (min) (ACUTE ONLY): 15 min  Charges:  $Therapeutic Activity: 8-22 mins                     Ellamae Sia, PT, DPT Acute Rehabilitation Services Pager [phone] Office [phone]    Willy Eddy 09/17/2018, 5:21 PM

## 2018-09-17 NOTE — NC FL2 (Signed)
[place] MEDICAID FL2 LEVEL OF CARE SCREENING TOOL     IDENTIFICATION  Patient Name: [name] Birthdate: August 30, 1950 Sex: female Admission Date (Current Location): 09/12/2018  [facility] and [place] Number:  Herbalist and Address:  [facility] [place], [place] [address], [place], [place] [address]      Provider Number: [id number]  Attending Physician Name and Address:  Audria Nine, DO  Relative Name and Phone Number:  [name] [name] (spouse) Home [phone] or Mobile [phone] and Marcie Bal (daughter) [phone]    Current Level of Care: Hospital Recommended Level of Care: [place] Prior Approval Number:    Date Approved/Denied:   PASRR Number: [id number]  Discharge Plan: SNF    Current Diagnoses: Patient Active Problem List   Diagnosis Date Noted  . Acquired right foot drop 01/12/2017  . Osteoporosis 12/03/2014  . Vitamin D deficiency 12/19/2012  . Dysarthria as late effect of cerebrovascular accident (CVA) 12/12/2012  . Gait instability 12/12/2012  . Contracture of joint 12/12/2012  . Hemiplegia of dominant side as late effect following cerebrovascular disease ([place]) 12/12/2012  . Anticoagulated on Coumadin 09/19/2011  . Secondary cardiomyopathy ([place]) 08/24/2011  . (L) MCA cardio embolic stroke  [id number]  . H/O tobacco use 06/25/2011  . Weakness 05/28/2011  . History of CVA (cerebrovascular accident) 05/28/2011  . HTN (hypertension) 05/28/2011  . HLD (hyperlipidemia) 05/28/2011  . Seizure disorder ([place]) 05/28/2011    Orientation RESPIRATION BLADDER Height & Weight     Self, Situation, Place  Normal External catheter, Continent Weight: 135 lb 5.8 oz (61.4 kg) Height:  5\' 2"  (157.5 cm)  BEHAVIORAL SYMPTOMS/MOOD NEUROLOGICAL BOWEL NUTRITION STATUS      Continent Diet(dysphagia I)  AMBULATORY STATUS COMMUNICATION OF NEEDS Skin     Verbally Other (Comment)(Ecchymosis hand,wrist,arm and shoulder)                       Personal Care Assistance Level of Assistance  Bathing, Feeding, Dressing Bathing Assistance: Limited assistance Feeding assistance: Independent Dressing Assistance: Limited assistance     Functional Limitations Info  Sight, Hearing, Speech Sight Info: Adequate Hearing Info: Adequate Speech Info: Impaired(communicates by nodding and shaking her head)    SPECIAL CARE FACTORS FREQUENCY                       Contractures Contractures Info: Not present    Additional Factors Info  Code Status, Allergies, Insulin Sliding Scale Code Status Info: FULL Allergies Info: NKA   Insulin Sliding Scale Info: insulin aspart (novoLOG) injection 0-15 Units  every 4 hrs       Current Medications (09/17/2018):  This is the current hospital active medication list Current Facility-Administered Medications  Medication Dose Route Frequency Provider Last Rate Last Dose  . acetaminophen (TYLENOL) tablet 650 mg  650 mg Oral Q4H PRN Bowser, Laurel Dimmer, NP      . carvedilol (COREG) tablet 6.25 mg  6.25 mg Oral BID Audria Nine, DO      . Chlorhexidine Gluconate Cloth 2 % PADS 6 each  6 each Topical Q0600 Greta Doom, MD   6 each at 09/17/18 0606  . fentaNYL (SUBLIMAZE) injection 25 mcg  25 mcg Intravenous Q15 min PRN Virgel Manifold, MD      . fentaNYL (SUBLIMAZE) injection 25-100 mcg  25-100 mcg Intravenous Q30 min PRN Virgel Manifold, MD   50 mcg at 09/12/18 2300  . insulin aspart (novoLOG) injection 0-15  Units  0-15 Units Subcutaneous Q4H Cristal Generous, NP   2 Units at 09/16/18 1556  . lacosamide (VIMPAT) 100 mg in sodium chloride 0.9 % 25 mL IVPB  100 mg Intravenous Q12H Greta Doom, MD 70 mL/hr at 09/17/18 0938 100 mg at 09/17/18 0938  . levETIRAcetam (KEPPRA) IVPB 1500 mg/ 100 mL premix  1,500 mg Intravenous Q12H Aroor, Lanice Schwab, MD   Stopped at 09/17/18 0308  . ondansetron (ZOFRAN) injection 4 mg  4 mg Intravenous Q6H PRN Bowser, Laurel Dimmer, NP       . phenytoin (DILANTIN) 125 mg in sodium chloride 0.9 % 100 mL IVPB  125 mg Intravenous Q12H Bertis Ruddy, RPH      . sodium chloride flush (NS) 0.9 % injection 10-40 mL  10-40 mL Intracatheter Q12H Collene Gobble, MD   10 mL at 09/17/18 1029  . sodium chloride flush (NS) 0.9 % injection 10-40 mL  10-40 mL Intracatheter PRN Collene Gobble, MD      . warfarin (COUMADIN) tablet 5 mg  5 mg Oral ONCE-1800 Bertis Ruddy, RPH      . Warfarin - Pharmacist Dosing Inpatient   Does not apply [place], [facility] [place] [facility]         Discharge Medications: Please see discharge summary for a list of discharge medications.  Relevant Imaging Results:  Relevant Lab Results:   Additional Information SSN [id number]  Vinie Sill, [place]

## 2018-09-17 NOTE — Progress Notes (Addendum)
NAME:  [name], MRN:  [id number], DOB:  1950/06/02, LOS: 5 ADMISSION DATE:  09/12/2018, CONSULTATION DATE: 09/12/2018 REFERRING MD: Emergency, CHIEF COMPLAINT: Seizure activity  Brief History   68 year old female with a history of seizure and CVA with right-sided hemi-plegia with witnessed seizure earlier today.  History of present illness   68 year old female with a history of seizure and CVA with right-sided hemi-plegia with witnessed seizure earlier today.  Patient was urgently intubated due to inability to protect airway.  Husband relates that she ran out of all her medications which she obtains by mail approximately 4 days ago when he been unable to obtain refills.  She had required assistance to the bathroom this afternoon which is unusual for her and he had left her sitting on the bed only to find her at the side of the bed tremulous apparently seizing. On my evaluation in the emergency room the patient has no overt seizure activity.  Neurology has evaluated her here.  She is ventilated initial blood gas somewhat acidotic.   Significant Hospital Events   NA  Consults:  Neurology  Procedures:  NA  Significant Diagnostic Tests:  Normal CT scan of the brain without acute changes  Micro Data:  NA  Antimicrobials:  Empiric vancomycin and cefepime x1  8/21  Interim history/subjective:  8/26: doing well today, awake and communicating.   Objective   Blood pressure (!) 142/85, pulse 100, temperature 99.8 F (37.7 C), temperature source Oral, resp. rate (!) 26, height 5\' 2"  (1.575 m), weight 61.4 kg, SpO2 96 %.        Intake/Output Summary (Last 24 hours) at 09/17/2018 0954 Last data filed at 09/17/2018 0600 Gross per 24 hour  Intake 476.24 ml  Output 275 ml  Net 201.24 ml   Filed Weights   09/14/18 0500 09/16/18 0403 09/17/18 0500  Weight: 61.5 kg 61.5 kg 61.4 kg    Examination: General: frail, chronically ill appearing elderly woman reclining comfortably in  bed HENT: Fair dentition, moist mucous membranes, No LAD.  Normocephalic, atraumatic. Slurred speech Lungs: Bilateral chest excursion, CTAB, breathing comfortably on room air, sats are 97% per tele Cardiovascular:S1, S2, RRR, no RMG, ST per tele Abdomen: Soft, nondistended, nontender, BS+ Extremities: No peripheral edema or cyanosis, no obvious deformities noted Neuro: Awake and alert, tracking around the room.  Attempting to talk, but dysarthric.  Asymmetric face with muscle wasting on the right and asymmetric smile.  Right upper extremity unable to move with wrist contracture.  Right lower extremity weaker than the left, but moving both spontaneously. Able to fed herself and communicated well with nodding/ shaking head   Resolved Hospital Problem list   NA  Assessment & Plan:   Acute respiratory failure - Extubated 8/22. -Continue pulmonary hygiene increase mobility as able -Dysphagia 1 diet per speech; will monitor carefully for signs of aspiration - CXR prn  History of seizure activity in the setting of prior CVA.  Unfortunately she had run out of her Keppra at home.  She had perceivable seizure activity on EEG until 2 AM -Continue Keppra, Vimpat, Dilantin, will need to transition to PO  (defer to neuro)  Acute renal failure, resolved -Continue to monitor urine output and electrolytes -Avoid nephrotoxic meds - Maintain renal perfusion  Hypokalemia, hypophosphatemia -Resolved  Hypertension, probable diastolic dysfunction - BP is up trending.  -increase coreg back to home dose  History of CVA, on chronic Coumadin for anticoagulation. Unclear if history of Afib.  Discharge summary in 2013 indicates  that warfarin was initiated for ventricular hypokinesis associated with HFrEF. -Appreciate pharmacy's assistance.  INR therapeutic today. -May need to revisit if this medication is still required.  Neurology input appreciated  Nutrition - Dysphagia 1 diet - Aspiration precautions     Best practice:  Diet: Dysphasia 1 Pain/Anxiety/Delirium protocol (if indicated): N/A VAP protocol (if indicated): Completed DVT prophylaxis: yes GI prophylaxis: yes Glucose control:monitor Mobility: per PT Code Status: full Family Communication: Discussed with husband 8/25  Disposition: floor with TRH as primary or d/c to snf  Labs   CBC: Recent Labs  Lab 09/12/18 2022 09/12/18 2148 09/13/18 0127 09/14/18 0732 09/15/18 0521 09/17/18 0509  WBC 18.3*  --  16.1* 11.7* 10.8* 9.6  NEUTROABS 7.0  --   --   --   --   --   HGB 14.0 12.9 13.9 15.1* 13.6 13.9  HCT 47.1* 38.0 43.0 45.7 41.1 42.3  MCV 92.7  --  86.3 85.1 84.6 84.4  PLT 385  --  262 271 282 0000000    Basic Metabolic Panel: Recent Labs  Lab 09/12/18 2022 09/12/18 2148 09/13/18 0127 09/13/18 0130 09/14/18 0732 09/15/18 0521 09/17/18 0509  NA 137 140  --  137 139 139 138  K 5.2* 3.3*  --  4.6 3.4* 3.2* 3.9  CL 104  --   --  104 103 104 107  CO2 13*  --   --  19* 24 24 23   GLUCOSE 256*  --   --  165* 86 103* 100*  BUN 17  --   --  13 8 9 12   CREATININE 1.28*  --   --  0.93 0.85 0.82 0.93  CALCIUM 8.7*  --   --  8.0* 8.3* 8.4* 8.6*  MG 2.0  --  1.7  --  1.8 1.8 1.8  PHOS  --   --  3.7  --   --  1.7* 3.2   GFR: Estimated Creatinine Clearance: 49.2 mL/min (by C-G formula based on SCr of 0.93 mg/dL). Recent Labs  Lab 09/12/18 2022 09/13/18 0127 09/13/18 0355 09/14/18 0732 09/15/18 0521 09/17/18 0509  WBC 18.3* 16.1*  --  11.7* 10.8* 9.6  LATICACIDVEN 9.4* 2.8* 1.9  --   --   --     Liver Function Tests: Recent Labs  Lab 09/12/18 2022 09/13/18 0130  AST 62* 43*  ALT 21 22  ALKPHOS 115 98  BILITOT 1.0 0.7  PROT 7.5 6.9  ALBUMIN 3.9 3.6   No results for input(s): LIPASE, AMYLASE in the last 168 hours. No results for input(s): AMMONIA in the last 168 hours.  ABG    Component Value Date/Time   PHART 7.239 (L) 09/12/2018 2148   PCO2ART 47.8 09/12/2018 2148   PO2ART 141.0 (H) 09/12/2018 2148    HCO3 20.5 09/12/2018 2148   TCO2 22 09/12/2018 2148   ACIDBASEDEF 7.0 (H) 09/12/2018 2148   O2SAT 99.0 09/12/2018 2148     Coagulation Profile: Recent Labs  Lab 09/12/18 2022 09/14/18 0732 09/15/18 0521 09/16/18 0524 09/17/18 0509  INR 1.6* 1.8* 2.0* 2.6* 2.8*    Cardiac Enzymes: No results for input(s): CKTOTAL, CKMB, CKMBINDEX, TROPONINI in the last 168 hours.  HbA1C: Hemoglobin A1C  Date/Time Value Ref Range Status  08/15/2012 10:36 AM 5.3  Final   Hgb A1c MFr Bld  Date/Time Value Ref Range Status  09/13/2018 01:27 AM 5.9 (H) 4.8 - 5.6 % Final    Comment:    (NOTE) Pre diabetes:  5.7%-6.4% Diabetes:              >6.4% Glycemic control for   <7.0% adults with diabetes   01/13/2008 09:18 AM  4.6 - 6.1 % Final   5.4 (NOTE)   The ADA recommends the following therapeutic goal for glycemic   control related to Hgb A1C measurement:   Goal of Therapy:   < 7.0% Hgb A1C   Reference: American Diabetes Association: Clinical Practice   Recommendations 2008, Diabetes Care,  2008, 31:(Suppl 1).    CBG: Recent Labs  Lab 09/16/18 1532 09/16/18 2022 09/16/18 2339 09/17/18 0345 09/17/18 0753  GLUCAP 131* 99 112* 98 [place] DO Pager: [phone] After hours pager: [phone]  [facility] 09/17/2018, 9:54 AM

## 2018-09-17 NOTE — TOC Progression Note (Signed)
Transition of Care [facility]) - Progression Note    Patient Details  Name: [name] MRN: [id number] Date of Birth: 21-Jul-1950  Transition of Care [facility]) CM/SW [place], [place] Phone Number: 09/17/2018, 11:50 AM  Clinical Narrative:      CSW called and spoke with the patient's spouse, Parks Neptune and her daughter, Levada Dy. CSW introduced self and explained role.  CSW spoke with family regarding PT recommendation of SNF placement at time of discharge. Family is realistic regarding therapy needs and expressed being hopeful for SNF placement. Family reportes preference for [facility]. Family expressed understanding of CSW role and discharge process as well as medical condition. No questions/concerns about plan or treatment at this time.  Thurmond Butts, MSW, [facility] Clinical Social Worker [phone]    Expected Discharge Plan: Skilled Nursing Facility Barriers to Discharge: Continued Medical Work up  Expected Discharge Plan and Services Expected Discharge Plan: [place] In-house Referral: Clinical Social Work     Living arrangements for the past 2 months: Single Family Home                                       Social Determinants of Health (SDOH) Interventions    Readmission Risk Interventions No flowsheet data found.

## 2018-09-17 NOTE — Progress Notes (Signed)
[place] for Warfarin Indication: history of stroke  No Known Allergies  Patient Measurements: Height: 5\' 2"  (157.5 cm) Weight: 135 lb 5.8 oz (61.4 kg) IBW/kg (Calculated) : 50.1  Vital Signs: Temp: 98.6 F (37 C) (08/26 0400) Temp Source: Axillary (08/26 0400) BP: 142/85 (08/26 0600) Pulse Rate: 100 (08/26 0600)  Labs: Recent Labs    09/15/18 0521 09/16/18 0524 09/17/18 0509  HGB 13.6  --  13.9  HCT 41.1  --  42.3  PLT 282  --  333  LABPROT 22.2* 27.4* 29.2*  INR 2.0* 2.6* 2.8*  CREATININE 0.82  --  0.93    Estimated Creatinine Clearance: 49.2 mL/min (by C-G formula based on SCr of 0.93 mg/dL).   Medical History: Past Medical History:  Diagnosis Date  . Adenomatous polyp of colon 09/2012   repeat colonoscopy in 5 years by Dr. Sharlett Iles  . CHF (congestive heart failure) (HCC)    EF 15-20% as of 05/28/11 (Dr [facility] summary)  . CVA (cerebral infarction)   . Hemiparesis ([place])   . Hyperlipidemia   . Hypertension   . Seizures ([place])   . Sickle cell anemia (HCC)   . Stroke ([place])   . Vitamin D deficiency 2010    Assessment: 68 year old female admitted 8/21 after witnessed seizure at home. Patient had run out of Keppra medication for 4 days. Last dose of Warfarin was on 8/19 per husband. INR 1.6 >>1.8 (Phenytoin added, no Warfarin yet). H/H and Platelets are within normal limits. CT Head was negative for acute abnormality. CCM confirmed ok for warfarin restart and patient has now passed swallow evaluation.  PTA dosing: 5mg  daily  INR therapeutic at 2.8 today, CBC wnl and stable   Goal of Therapy:  INR 2-3 Monitor platelets by anticoagulation protocol: Yes   Plan:  Warfarin 5mg  PO x1 tonight Daily INR, s/s bleeding  Bertis Ruddy, PharmD Clinical Pharmacist Please check AMION for all [place] numbers 09/17/2018 7:53 AM

## 2018-09-17 NOTE — Progress Notes (Signed)
Patient transferred to 3w. Alert and oriented x2. Family at bedside. No skin issues. Seizures precautions in place. Bed in lowest position. Call bell within reach. Patient oriented to room. Nurse will continue to monitor. [place]

## 2018-09-17 NOTE — Progress Notes (Signed)
[place] for phenytoin  Indication: seizures  No Known Allergies  Patient Measurements: Height: 5\' 2"  (157.5 cm) Weight: 135 lb 5.8 oz (61.4 kg) IBW/kg (Calculated) : 50.1  Vital Signs: Temp: 98.6 F (37 C) (08/26 0400) Temp Source: Axillary (08/26 0400) BP: 142/85 (08/26 0600) Pulse Rate: 100 (08/26 0600) Intake/Output from previous day: 08/25 0701 - 08/26 0700 In: 745.8 [P.O.:300; I.V.:276.1; IV Piggyback:169.8] Out: 275 [Urine:275] Intake/Output from this shift: No intake/output data recorded.  Labs: Recent Labs    09/15/18 0521 09/17/18 0509  WBC 10.8* 9.6  HGB 13.6 13.9  HCT 41.1 42.3  PLT 282 333  CREATININE 0.82 0.93  MG 1.8 1.8  PHOS 1.7* 3.2   Estimated Creatinine Clearance: 49.2 mL/min (by C-G formula based on SCr of 0.93 mg/dL).    Medical History: Past Medical History:  Diagnosis Date  . Adenomatous polyp of colon 09/2012   repeat colonoscopy in 5 years by Dr. Sharlett Iles  . CHF (congestive heart failure) (HCC)    EF 15-20% as of 05/28/11 (Dr [facility] D[facility] summary)  . CVA (cerebral infarction)   . Hemiparesis ([place])   . Hyperlipidemia   . Hypertension   . Seizures ([place])   . Sickle cell anemia (HCC)   . Stroke ([place])   . Vitamin D deficiency 2010     Assessment: 68 yo female with EEG showing status epilepticus (history of seizures on keppra PTA; medication compliance concerns noted). Pharmacy consulted to dose phenytoin. She is also noted on keppra  Phenytoin level (total) = 22.7;  Albumin wnl, SCr 0.93 Current regimen: 100mg  IV every 8 hours  Goal of Therapy:  Phenytoin level= 10-20  Plan:  Decrease to phenytoin 125mg  IV BID Monitor renal function, phenytoin level in 5 days  Bertis Ruddy, PharmD Clinical Pharmacist Please check AMION for all [place] numbers 09/17/2018 7:44 AM

## 2018-09-17 NOTE — Plan of Care (Signed)
  Problem: Education: Goal: Expressions of having a comfortable level of knowledge regarding the disease process will increase Outcome: Progressing   Problem: Coping: Goal: Ability to adjust to condition or change in health will improve Outcome: Progressing Goal: Ability to identify appropriate support needs will improve Outcome: Progressing   Problem: Health Behavior/Discharge Planning: Goal: Compliance with prescribed medication regimen will improve Outcome: Progressing   Problem: Medication: Goal: Risk for medication side effects will decrease Outcome: Progressing   Problem: Clinical Measurements: Goal: Complications related to the disease process, condition or treatment will be avoided or minimized Outcome: Progressing Goal: Diagnostic test results will improve Outcome: Progressing   Problem: Safety: Goal: Verbalization of understanding the information provided will improve Outcome: Progressing   Problem: Self-Concept: Goal: Level of anxiety will decrease Outcome: Progressing Goal: Ability to verbalize feelings about condition will improve Outcome: Progressing   Ival Bible, BSN, RN

## 2018-09-17 NOTE — Progress Notes (Signed)
Reason for consult: Seizure  Subjective: No acute events overnight.  Patient has expressive aphasia so review of systems is limited but is smiling and denies any concerns.   ROS: negative except above  Examination  Vital signs in last 24 hours: Temp:  [97.4 F (36.3 C)-99.8 F (37.7 C)] 99.8 F (37.7 C) (08/26 0800) Pulse Rate:  [89-108] 100 (08/26 0600) Resp:  [17-31] 26 (08/26 0600) BP: (109-157)/(73-92) 142/85 (08/26 0600) SpO2:  [93 %-98 %] 96 % (08/26 0600) Weight:  [61.4 kg] 61.4 kg (08/26 0500)  General: lying in bed, not in apparent distress, smiling CVS: pulse-normal rate and rhythm RS: breathing comfortably Extremities: normal, warm  Neuro: MS: Alert, follows simple one-step commands but not consistently, will continue speech but not very clear, persistent expressive aphasia CN: pupils equal and reactive,  EOMI, face symmetric, tongue midline, normal sensation over face, Motor: Right upper extremity plegic with increased flexor tone and chronic contractures, right lower extremity 4/5, 5/5 in left upper and lower extremity Reflexes: Brisk in the right upper and lower extremity, 2+ left upper and lower extremity Coordination: Unable to assess as patient was unable to follow complex commands Gait: not tested   Basic Metabolic Panel: Recent Labs  Lab 09/12/18 2022 09/12/18 2148 09/13/18 0127 09/13/18 0130 09/14/18 0732 09/15/18 0521 09/17/18 0509  NA 137 140  --  137 139 139 138  K 5.2* 3.3*  --  4.6 3.4* 3.2* 3.9  CL 104  --   --  104 103 104 107  CO2 13*  --   --  19* 24 24 23   GLUCOSE 256*  --   --  165* 86 103* 100*  BUN 17  --   --  13 8 9 12   CREATININE 1.28*  --   --  0.93 0.85 0.82 0.93  CALCIUM 8.7*  --   --  8.0* 8.3* 8.4* 8.6*  MG 2.0  --  1.7  --  1.8 1.8 1.8  PHOS  --   --  3.7  --   --  1.7* 3.2    CBC: Recent Labs  Lab 09/12/18 2022 09/12/18 2148 09/13/18 0127 09/14/18 0732 09/15/18 0521 09/17/18 0509  WBC 18.3*  --  16.1* 11.7*  10.8* 9.6  NEUTROABS 7.0  --   --   --   --   --   HGB 14.0 12.9 13.9 15.1* 13.6 13.9  HCT 47.1* 38.0 43.0 45.7 41.1 42.3  MCV 92.7  --  86.3 85.1 84.6 84.4  PLT 385  --  262 271 282 333     Coagulation Studies: Recent Labs    09/15/18 0521 09/16/18 0524 09/17/18 0509  LABPROT 22.2* 27.4* 29.2*  INR 2.0* 2.6* 2.8*   Imaging: CT head without contrast 09/12/2018: No acute abnormality.  Remote left MCA distribution infarct.  Assessment: 68 year old female with history of large left MCA stroke and seizure disorder who presented with seizures in the setting of noncompliance. Keppra was restarted, but initial EEG showed NCSE. Anticonvulsant titration was initiated, with Keppra scheduled dose increased and addition of Dilantin and Vimpat to her regimen. She hasbeen seizure-free since addition of Vimpat.  Status epilepticus (resolved) Focal epilepsy, left frontal temporal Breakthrough seizure Expressive aphasia  -No further seizures on LTM.  Last seizure on 09/14/2018 at 1:43 AM. -Etiology for status epilepticus and breakthrough seizure is medication noncompliance due to running out of prescription  Recommendations:  - Continue Keppra 1.5 g twice daily and Vimpat 100 mg  twice daily - Patient is also on Dilantin 125 mg twice daily which interferes with her warfarin metabolism.  Her seizures are well controlled on monotherapy with Keppra and therefore we will try to wean her off  Phenytoin Morning Evening  8/26-8/27 125mg  ( can be 120mg  when outpatient) 60mg   8/28-8/29 60mg  60mg   8/30-8/31 60mg  30mg   9/1-9/2 30mg  30mg   9/3-9/4 30mg  STOP  9/5 STOP STOP   -Patient will require frequent INR checks while we taper her off dilantin. Please check INR atleast once every 2 days if outpatient and daily if inpatient.  -Seizure precautions -As needed IV Ativan 2 mg for generalized tonic-clonic seizure lasting more than 3 minutes while inpatient -Follow-up with outpatient  neurologist/epileptologist in 4 to 6 weeks after discharge.   Thank you for allowing [place] to participate in the care of this patient. Please call neurohospitalist if you have any further questions.

## 2018-09-17 NOTE — TOC Progression Note (Signed)
Transition of Care [facility]) - Progression Note    Patient Details  Name: [name] MRN: [id number] Date of Birth: 1950/05/03  Transition of Care [facility]) CM/SW [place], [place] Phone Number: 09/17/2018, 2:35 PM  Clinical Narrative:     Patient's spouse has accepted offer for [facility].   SNF has started insurance approval today.  Patient will need insurance authorization and Covid test prior to discharge to SNF. CSW will continue to follow and assist with discharge needs.  Thurmond Butts, MSW, [facility] Clinical Social Worker [phone]    Expected Discharge Plan: Skilled Nursing Facility Barriers to Discharge: Continued Medical Work up  Expected Discharge Plan and Services Expected Discharge Plan: [place] In-house Referral: Clinical Social Work     Living arrangements for the past 2 months: Single Family Home                                       Social Determinants of Health (SDOH) Interventions    Readmission Risk Interventions No flowsheet data found.

## 2018-09-18 ENCOUNTER — Inpatient Hospital Stay (HOSPITAL_COMMUNITY): Payer: Medicare HMO

## 2018-09-18 LAB — GLUCOSE, CAPILLARY
Glucose-Capillary: 104 mg/dL — ABNORMAL HIGH (ref 70–99)
Glucose-Capillary: 112 mg/dL — ABNORMAL HIGH (ref 70–99)
Glucose-Capillary: 157 mg/dL — ABNORMAL HIGH (ref 70–99)
Glucose-Capillary: 81 mg/dL (ref 70–99)
Glucose-Capillary: 86 mg/dL (ref 70–99)
Glucose-Capillary: 88 mg/dL (ref 70–99)
Glucose-Capillary: 95 mg/dL (ref 70–99)

## 2018-09-18 LAB — BASIC METABOLIC PANEL
Anion gap: 12 (ref 5–15)
BUN: 13 mg/dL (ref 8–23)
CO2: 22 mmol/L (ref 22–32)
Calcium: 9 mg/dL (ref 8.9–10.3)
Chloride: 105 mmol/L (ref 98–111)
Creatinine, Ser: 0.84 mg/dL (ref 0.44–1.00)
GFR calc Af Amer: >60 mL/min (ref >60)
GFR calc non Af Amer: >60 mL/min (ref >60)
Glucose, Bld: 95 mg/dL (ref 70–99)
Potassium: 3.7 mmol/L (ref 3.5–5.1)
Sodium: 139 mmol/L (ref 135–145)

## 2018-09-18 LAB — CULTURE, BLOOD (ROUTINE X 2): Culture: NO GROWTH

## 2018-09-18 LAB — TROPONIN I (HIGH SENSITIVITY)
Troponin I (High Sensitivity): 86 ng/L — ABNORMAL HIGH (ref <18)
Troponin I (High Sensitivity): 93 ng/L — ABNORMAL HIGH (ref <18)

## 2018-09-18 LAB — PROTIME-INR
INR: 2.7 — ABNORMAL HIGH (ref 0.8–1.2)
Prothrombin Time: 27.9 seconds — ABNORMAL HIGH (ref 11.4–15.2)

## 2018-09-18 LAB — NOVEL CORONAVIRUS, NAA (HOSP ORDER, SEND-OUT TO REF LAB; TAT 18-24 HRS): SARS-CoV-2, NAA: NOT DETECTED

## 2018-09-18 MED ORDER — ASPIRIN 81 MG PO CHEW
CHEWABLE_TABLET | ORAL | Status: AC
  Filled 2018-09-18: qty 1

## 2018-09-18 MED ORDER — LEVETIRACETAM 750 MG PO TABS
1500.0000 mg | ORAL_TABLET | Freq: Two times a day (BID) | ORAL | Status: DC
  Administered 2018-09-18 – 2018-09-20 (×5): 1500 mg via ORAL
  Filled 2018-09-18 (×5): qty 2

## 2018-09-18 MED ORDER — FUROSEMIDE 10 MG/ML IJ SOLN
40.0000 mg | Freq: Once | INTRAMUSCULAR | Status: DC
  Filled 2018-09-18: qty 4

## 2018-09-18 MED ORDER — ATORVASTATIN CALCIUM 40 MG PO TABS
40.0000 mg | ORAL_TABLET | Freq: Every day | ORAL | Status: DC
  Administered 2018-09-18 – 2018-09-19 (×2): 40 mg via ORAL
  Filled 2018-09-18 (×2): qty 1

## 2018-09-18 MED ORDER — ASPIRIN 81 MG PO CHEW
81.0000 mg | CHEWABLE_TABLET | Freq: Once | ORAL | Status: AC
  Administered 2018-09-18: 81 mg via ORAL

## 2018-09-18 MED ORDER — LEVETIRACETAM 750 MG PO TABS: 1500.0000 mg | ORAL_TABLET | Freq: Two times a day (BID) | ORAL | 0 refills | Status: DC

## 2018-09-18 MED ORDER — WARFARIN SODIUM 5 MG PO TABS
5.0000 mg | ORAL_TABLET | Freq: Once | ORAL | Status: AC
  Administered 2018-09-18: 5 mg via ORAL
  Filled 2018-09-18: qty 1

## 2018-09-18 MED ORDER — PHENYTOIN 125 MG/5ML PO SUSP: ORAL | 0 refills | Status: DC

## 2018-09-18 MED ORDER — LACOSAMIDE 50 MG PO TABS
100.0000 mg | ORAL_TABLET | Freq: Two times a day (BID) | ORAL | Status: DC
  Administered 2018-09-18 – 2018-09-20 (×5): 100 mg via ORAL
  Filled 2018-09-18 (×5): qty 2

## 2018-09-18 MED ORDER — PANTOPRAZOLE SODIUM 40 MG PO TBEC
40.0000 mg | DELAYED_RELEASE_TABLET | Freq: Every day | ORAL | Status: DC
  Administered 2018-09-18 – 2018-09-20 (×3): 40 mg via ORAL
  Filled 2018-09-18 (×3): qty 1

## 2018-09-18 MED ORDER — LACOSAMIDE 100 MG PO TABS: 100.0000 mg | ORAL_TABLET | Freq: Two times a day (BID) | ORAL | 0 refills | Status: DC

## 2018-09-18 MED ORDER — FUROSEMIDE 40 MG PO TABS
40.0000 mg | ORAL_TABLET | Freq: Once | ORAL | Status: AC
  Administered 2018-09-18: 40 mg via ORAL
  Filled 2018-09-18: qty 1

## 2018-09-18 MED ORDER — NITROGLYCERIN 0.4 MG SL SUBL
0.4000 mg | SUBLINGUAL_TABLET | SUBLINGUAL | Status: DC | PRN
  Administered 2018-09-18: 0.4 mg via SUBLINGUAL
  Filled 2018-09-18 (×2): qty 1

## 2018-09-18 NOTE — Discharge Summary (Addendum)
Physician Discharge Summary  [name] [id number] DOB: [birth year] DOA: 09/12/2018  PCP: Rutherford Guys, MD  Admit date: 09/12/2018 Discharge date: 09/18/2018  Admitted From: Home  Disposition:  SNF  Recommendations for Outpatient Follow-up:  1. Follow up with PCP in 1-2 weeks 2. Please obtain BMP/CBC in one week 3. Needs dilantin  taper. Monitor coumadin level every other day.  4. Needs to follow up with neurology outpatient, please arrange.     Discharge Condition: stable.  CODE STATUS: full code Diet recommendation:  Dysphagia 3 diet   Brief/Interim Summary: 68 year old female with a history of seizure and CVA with right-sided hemi-plegia with witnessed seizure earlier today.  Patient was urgently intubated due to inability to protect airway.  Husband relates that she ran out of all her medications which she obtains by mail approximately 4 days ago when he been unable to obtain refills.  She had required assistance to the bathroom this afternoon which is unusual for her and he had left her sitting on the bed only to find her at the side of the bed tremulous apparently seizing. On my evaluation in the emergency room the patient has no overt seizure activity.  Neurology has evaluated her here.  She is ventilated initial blood gas somewhat acidotic.  Addendum;  Discharge canceled.  Patient complaining of chest pain.  EKG with T wave inversion anterior lead.  Will give nitro, aspirin , lipitor.  Check troponin.   Acute respiratory failure; intubated for airway protection due to status epileptico.   - Extubated 8/22. -pulmonary hygiene.  -Dysphagia 3 diet per speech; will monitor carefully for signs of aspiration - respiratory status stable.    History of seizure activity in the setting of prior CVA.  Unfortunately she had run out of her Keppra at home.  She had perceivable seizure activity on EEG until 2 AM -Continue Keppra, Vimpat, Dilantin. Transition to oral.   -Dilantin taper, see ordered.   Acute renal failure, resolved -Continue to monitor urine output and electrolytes -Avoid nephrotoxic meds - Maintain renal perfusion  Hypokalemia, hypophosphatemia -Resolved  Hypertension, probable diastolic dysfunction - BP is up trending.  -increase coreg back to home dose  History of CVA, on chronic Coumadin for anticoagulation. Unclear if history of Afib.  Discharge summary in 2013 indicates that warfarin was initiated for ventricular hypokinesis associated with HFrEF. -Appreciate pharmacy's assistance.  INR therapeutic today. -May need to revisit if this medication is still required.  Neurology input appreciated -monitor INR daily.   Nutrition - Dysphagia 1 diet - Aspiration precautions  Discharge Diagnoses:  Active Problems:   Seizure disorder [facility])    Discharge Instructions  Discharge Instructions    Diet - low sodium heart healthy   Complete by: As directed    Increase activity slowly   Complete by: As directed      Allergies as of 09/18/2018   No Known Allergies     Medication List    STOP taking these medications   docusate sodium 50 MG capsule Commonly known as: COLACE   hydrocortisone 25 MG suppository Commonly known as: Network engineer     TAKE these medications   alendronate 70 MG tablet Commonly known as: FOSAMAX TAKE 1 TABLET EVERY 7 DAYS WITH FULL GLASS OF WATER ON AN EMPTY STOMACH   atorvastatin 80 MG tablet Commonly known as: LIPITOR Take 1 tablet (80 mg total) by mouth at bedtime.   baclofen 20 MG tablet Commonly known as: LIORESAL TAKE 1 TABLET  THREE TIMES DAILY (MAY TAKE AN ADDITIONAL TABLET MID-DAY OR BEFORE BED AS NEEDED FOR MUSCLE SPASMS)   carvedilol 6.25 MG tablet Commonly known as: COREG Take 1 tablet (6.25 mg total) by mouth 2 (two) times daily.   digoxin 0.125 MG tablet Commonly known as: LANOXIN TAKE 1 TABLET (125 MCG TOTAL) BY MOUTH DAILY.   Lacosamide 100 MG  Tabs Take 1 tablet (100 mg total) by mouth 2 (two) times daily.   levETIRAcetam 750 MG tablet Commonly known as: KEPPRA Take 2 tablets (1,500 mg total) by mouth 2 (two) times daily. What changed:   medication strength  how much to take   nystatin powder Generic drug: nystatin Apply topically daily. Apply under breasts once daily after bath   phenytoin 125 MG/5ML suspension Commonly known as: DILANTIN Phenytoin Morning Evening 8/26-8/27 120mg   60mg  8/28-8/29 60mg              60mg  8/30-8/31 60mg              30mg  9/1-9/2 30mg  30mg  9/3-9/4 30mg  STOP 9/5 STOP STOP   polyethylene glycol 17 g packet Commonly known as: MIRALAX / GLYCOLAX Take 17 g by mouth daily as needed for moderate constipation.   triamcinolone cream 0.1 % Commonly known as: KENALOG Apply 1 application topically 2 (two) times daily.   Trixaicin 0.025 % cream Generic drug: capsicum oleoresin Apply 1 application topically daily as needed (arthiritis pain).   Vitamin D (Ergocalciferol) 1.25 MG (50000 UT) Caps capsule Commonly known as: DRISDOL TAKE 1 CAPSULE EVERY 7 DAYS   warfarin 5 MG tablet Commonly known as: COUMADIN TAKE AS DIRECTED BY COUMADIN CLINIC. NEED OFFICE VISIT WITH FASTING LABS FOR REFILLS What changed: See the new instructions.       No Known Allergies  Consultations: Neurology  Procedures/Studies: Ct Head Wo Contrast  Result Date: 09/12/2018 CLINICAL DATA:  C-spine trauma, high clinical risk, found down EXAM: CT HEAD WITHOUT CONTRAST CT CERVICAL SPINE WITHOUT CONTRAST TECHNIQUE: Multidetector CT imaging of the head and cervical spine was performed following the standard protocol without intravenous contrast. Multiplanar CT image reconstructions of the cervical spine were also generated. COMPARISON:  MR May 28, 2011 FINDINGS: CT HEAD FINDINGS Brain: Chronic encephalomalacia within the left MCA distribution similar to comparison from from 2013. No evidence of acute infarction,  hemorrhage, hydrocephalus, extra-axial collection or mass lesion/mass effect. Symmetric prominence of the ventricles, cisterns and sulci compatible with parenchymal volume loss. Patchy areas of white matter hypoattenuation are most compatible with chronic microvascular angiopathy. Vascular: Atherosclerotic calcification of the carotid siphons. Skull: No calvarial fracture or suspicious osseous lesion. No scalp swelling or hematoma. Sinuses/Orbits: Paranasal sinuses and mastoid air cells are predominantly clear. Orbital structures are unremarkable. Other: None CT CERVICAL SPINE FINDINGS Alignment: Straightening of the normal cervical lordosis without traumatic listhesis. No abnormal facet widening. Normal alignment of the craniocervical and atlantoaxial articulations. Skull base and vertebrae: No acute fracture. No primary bone lesion or focal pathologic process. Soft tissues and spinal canal: No pre or paravertebral fluid or swelling. No visible canal hematoma. Disc levels: Multilevel cervical spondylitic changes are present with calcified disc osteophyte complexes at C3-4, C4-5, C5-6 these findings result in at most mild spinal canal stenosis. Uncinate spurring results and mild right neural foraminal narrowing at C5-6. No other significant canal or foraminal stenosis within the included portions of the cervical spine. Upper chest: Some interlobular septal thickening on a background of emphysematous change. Other: Endotracheal and transesophageal tubes are noted within their respective lumens.  Small amount of airways secretions noted above the inflated endotracheal balloon. Atherosclerotic plaque within the carotid bifurcations. Small amount of gas in the subclavian veins, likely iatrogenic related to intravenous access. IMPRESSION: 1. No acute intracranial abnormality. Remote left MCA distribution infarct. 2. No acute cervical spine fracture. Multilevel degenerative changes of the cervical spine as described above.  3. Endotracheal and transesophageal tubes are noted within their respective lumens. Small amount of airways secretions noted above the inflated endotracheal balloon. Electronically Signed   By: Lovena Le M.D.   On: 09/12/2018 21:14   Ct Cervical Spine Wo Contrast  Result Date: 09/12/2018 CLINICAL DATA:  C-spine trauma, high clinical risk, found down EXAM: CT HEAD WITHOUT CONTRAST CT CERVICAL SPINE WITHOUT CONTRAST TECHNIQUE: Multidetector CT imaging of the head and cervical spine was performed following the standard protocol without intravenous contrast. Multiplanar CT image reconstructions of the cervical spine were also generated. COMPARISON:  MR May 28, 2011 FINDINGS: CT HEAD FINDINGS Brain: Chronic encephalomalacia within the left MCA distribution similar to comparison from from 2013. No evidence of acute infarction, hemorrhage, hydrocephalus, extra-axial collection or mass lesion/mass effect. Symmetric prominence of the ventricles, cisterns and sulci compatible with parenchymal volume loss. Patchy areas of white matter hypoattenuation are most compatible with chronic microvascular angiopathy. Vascular: Atherosclerotic calcification of the carotid siphons. Skull: No calvarial fracture or suspicious osseous lesion. No scalp swelling or hematoma. Sinuses/Orbits: Paranasal sinuses and mastoid air cells are predominantly clear. Orbital structures are unremarkable. Other: None CT CERVICAL SPINE FINDINGS Alignment: Straightening of the normal cervical lordosis without traumatic listhesis. No abnormal facet widening. Normal alignment of the craniocervical and atlantoaxial articulations. Skull base and vertebrae: No acute fracture. No primary bone lesion or focal pathologic process. Soft tissues and spinal canal: No pre or paravertebral fluid or swelling. No visible canal hematoma. Disc levels: Multilevel cervical spondylitic changes are present with calcified disc osteophyte complexes at C3-4, C4-5, C5-6 these  findings result in at most mild spinal canal stenosis. Uncinate spurring results and mild right neural foraminal narrowing at C5-6. No other significant canal or foraminal stenosis within the included portions of the cervical spine. Upper chest: Some interlobular septal thickening on a background of emphysematous change. Other: Endotracheal and transesophageal tubes are noted within their respective lumens. Small amount of airways secretions noted above the inflated endotracheal balloon. Atherosclerotic plaque within the carotid bifurcations. Small amount of gas in the subclavian veins, likely iatrogenic related to intravenous access. IMPRESSION: 1. No acute intracranial abnormality. Remote left MCA distribution infarct. 2. No acute cervical spine fracture. Multilevel degenerative changes of the cervical spine as described above. 3. Endotracheal and transesophageal tubes are noted within their respective lumens. Small amount of airways secretions noted above the inflated endotracheal balloon. Electronically Signed   By: Lovena Le M.D.   On: 09/12/2018 21:14   Dg Chest Port 1 View  Result Date: 09/14/2018 CLINICAL DATA:  Acute respiratory failure. EXAM: PORTABLE CHEST 1 VIEW COMPARISON:  Chest radiograph 09/13/2018 FINDINGS: Stable enlarged cardiac and mediastinal contours. Elevation right hemidiaphragm. Limited exam due to overlapping hardware. Minimal bibasilar atelectasis. No large area pulmonary consolidation. No pleural effusion or pneumothorax. IMPRESSION: Improving bibasilar atelectasis. Exam limited due to overlapping hardware. Electronically Signed   By: Lovey Newcomer M.D.   On: 09/14/2018 09:18   Dg Chest Port 1 View  Result Date: 09/13/2018 CLINICAL DATA:  Respiratory failure EXAM: PORTABLE CHEST 1 VIEW COMPARISON:  Chest radiograph 09/12/2018 FINDINGS: ETT terminates distal trachea. Enteric tube courses inferior to the  diaphragm. Monitoring leads overlie the patient. Stable cardiac and  mediastinal contours. Improving bilateral lower lung opacities. No definite pleural effusion or pneumothorax. IMPRESSION: Improving bilateral lower lung opacities which may represent improving atelectasis or infection. Electronically Signed   By: Lovey Newcomer M.D.   On: 09/13/2018 09:30   Dg Chest Portable 1 View  Result Date: 09/12/2018 CLINICAL DATA:  Ventilator dependence. EXAM: PORTABLE CHEST 1 VIEW COMPARISON:  Earlier same day FINDINGS: Endotracheal tube tip is 1.4 cm above the base of the carina. The NG tube passes into the stomach although the distal tip position is not included on the film. The cardio pericardial silhouette is enlarged. Bibasilar atelectasis or infiltrate noted. The visualized bony structures of the thorax are intact. Insert wire IMPRESSION: Cardiomegaly with bibasilar atelectasis or infiltrate. Electronically Signed   By: Misty Stanley M.D.   On: 09/12/2018 21:23     Subjective: Alert, chronic aphasic.   Discharge Exam: Vitals:   09/18/18 0427 09/18/18 0756  BP: (!) 154/80 (!) 141/78  Pulse: 98 97  Resp: 17 18  Temp: 98.6 F (37 C) 99 F (37.2 C)  SpO2: 98% 97%     General: Pt is alert, awake, not in acute distress Cardiovascular: RRR, S1/S2 +, no rubs, no gallops Respiratory: CTA bilaterally, no wheezing, no rhonchi Abdominal: Soft, NT, ND, bowel sounds + Extremities: no edema, no cyanosis    The results of significant diagnostics from this hospitalization (including imaging, microbiology, ancillary and laboratory) are listed below for reference.     Microbiology: Recent Results (from the past 240 hour(s))  SARS Coronavirus 2 [facility] order, Performed in [facility] hospital lab) Nasopharyngeal Nasopharyngeal Swab     Status: None   Collection Time: 09/12/18  8:44 PM   Specimen: Nasopharyngeal Swab  Result Value Ref Range Status   SARS Coronavirus 2 NEGATIVE NEGATIVE Final    Comment: (NOTE) If result is NEGATIVE SARS-CoV-2 target nucleic acids  are NOT DETECTED. The SARS-CoV-2 RNA is generally detectable in upper and lower  respiratory specimens during the acute phase of infection. The lowest  concentration of SARS-CoV-2 viral copies this assay can detect is 250  copies / mL. A negative result does not preclude SARS-CoV-2 infection  and should not be used as the sole basis for treatment or other  patient management decisions.  A negative result may occur with  improper specimen collection / handling, submission of specimen other  than nasopharyngeal swab, presence of viral mutation(s) within the  areas targeted by this assay, and inadequate number of viral copies  (<250 copies / mL). A negative result must be combined with clinical  observations, patient history, and epidemiological information. If result is POSITIVE SARS-CoV-2 target nucleic acids are DETECTED. The SARS-CoV-2 RNA is generally detectable in upper and lower  respiratory specimens dur ing the acute phase of infection.  Positive  results are indicative of active infection with SARS-CoV-2.  Clinical  correlation with patient history and other diagnostic information is  necessary to determine patient infection status.  Positive results do  not rule out bacterial infection or co-infection with other viruses. If result is PRESUMPTIVE POSTIVE SARS-CoV-2 nucleic acids MAY BE PRESENT.   A presumptive positive result was obtained on the submitted specimen  and confirmed on repeat testing.  While 2019 novel coronavirus  (SARS-CoV-2) nucleic acids may be present in the submitted sample  additional confirmatory testing may be necessary for epidemiological  and / or clinical management purposes  to differentiate between  SARS-CoV-2  and other Sarbecovirus currently known to infect humans.  If clinically indicated additional testing with an alternate test  methodology [phone]) is advised. The SARS-CoV-2 RNA is generally  detectable in upper and lower respiratory sp ecimens  during the acute  phase of infection. The expected result is Negative. Fact Sheet for Patients:  StrictlyIdeas.no Fact Sheet for Healthcare Providers: BankingDealers.co.za This test is not yet approved or cleared by the [place] FDA and has been authorized for detection and/or diagnosis of SARS-CoV-2 by FDA under an Emergency Use Authorization (EUA).  This EUA will remain in effect (meaning this test can be used) for the duration of the COVID-19 declaration under Section 564(b)(1) of the Act, 21 U.S.C. section 360bbb-3(b)(1), unless the authorization is terminated or revoked sooner. Performed at [facility], [place] [address], [place], [place] [address]   Blood culture (routine x 2)     Status: None   Collection Time: 09/12/18 11:05 PM   Specimen: BLOOD LEFT HAND  Result Value Ref Range Status   Specimen Description BLOOD LEFT HAND  Final   Special Requests   Final    BOTTLES DRAWN AEROBIC ONLY Blood Culture results may not be optimal due to an inadequate volume of blood received in culture bottles   Culture   Final    NO GROWTH 5 DAYS Performed at [facility], [place] [address], [place], [place] [address]    Report Status 09/17/2018 FINAL  Final  MRSA PCR Screening     Status: None   Collection Time: 09/12/18 11:57 PM   Specimen: Nasal Mucosa; Nasopharyngeal  Result Value Ref Range Status   MRSA by PCR NEGATIVE NEGATIVE Final    Comment:        The GeneXpert MRSA Assay (FDA approved for NASAL specimens only), is one component of a comprehensive MRSA colonization surveillance program. It is not intended to diagnose MRSA infection nor to guide or monitor treatment for MRSA infections. Performed at [facility], [place] [address], [place], [place] [address]   Culture, blood (Routine X 2) w Reflex to ID Panel     Status: None   Collection Time: 09/13/18  1:27 AM   Specimen: BLOOD RIGHT HAND  Result Value Ref  Range Status   Specimen Description BLOOD RIGHT HAND  Final   Special Requests   Final    BOTTLES DRAWN AEROBIC ONLY Blood Culture results may not be optimal due to an inadequate volume of blood received in culture bottles   Culture   Final    NO GROWTH 5 DAYS Performed at [facility], [place] [address] [place], [place] [address]    Report Status 09/18/2018 FINAL  Final     Labs: BNP (last 3 results) No results for input(s): BNP in the last 8760 hours. Basic Metabolic Panel: Recent Labs  Lab 09/12/18 2022  09/13/18 0127 09/13/18 0130 09/14/18 0732 09/15/18 0521 09/17/18 0509 09/18/18 0724  NA 137   < >  --  137 139 139 138 139  K 5.2*   < >  --  4.6 3.4* 3.2* 3.9 3.7  CL 104  --   --  104 103 104 107 105  CO2 13*  --   --  19* 24 24 23 22   GLUCOSE 256*  --   --  165* 86 103* 100* 95  BUN 17  --   --  13 8 9 12 13   CREATININE 1.28*  --   --  0.93 0.85 0.82 0.93 0.84  CALCIUM 8.7*  --   --  8.0* 8.3* 8.4* 8.6* 9.0  MG 2.0  --  1.7  --  1.8 1.8 1.8  --   PHOS  --   --  3.7  --   --  1.7* 3.2  --    < > = values in this interval not displayed.   Liver Function Tests: Recent Labs  Lab 09/12/18 2022 09/13/18 0130  AST 62* 43*  ALT 21 22  ALKPHOS 115 98  BILITOT 1.0 0.7  PROT 7.5 6.9  ALBUMIN 3.9 3.6   No results for input(s): LIPASE, AMYLASE in the last 168 hours. No results for input(s): AMMONIA in the last 168 hours. CBC: Recent Labs  Lab 09/12/18 2022 09/12/18 2148 09/13/18 0127 09/14/18 0732 09/15/18 0521 09/17/18 0509  WBC 18.3*  --  16.1* 11.7* 10.8* 9.6  NEUTROABS 7.0  --   --   --   --   --   HGB 14.0 12.9 13.9 15.1* 13.6 13.9  HCT 47.1* 38.0 43.0 45.7 41.1 42.3  MCV 92.7  --  86.3 85.1 84.6 84.4  PLT 385  --  262 271 282 333   Cardiac Enzymes: No results for input(s): CKTOTAL, CKMB, CKMBINDEX, TROPONINI in the last 168 hours. BNP: Invalid input(s): POCBNP CBG: Recent Labs  Lab 09/17/18 1617 09/17/18 2044 09/18/18 0020 09/18/18 0427  09/18/18 0737  GLUCAP 110* 163* 81 86 95   D-Dimer No results for input(s): DDIMER in the last 72 hours. Hgb A1c No results for input(s): HGBA1C in the last 72 hours. Lipid Profile No results for input(s): CHOL, HDL, LDLCALC, TRIG, CHOLHDL, LDLDIRECT in the last 72 hours. Thyroid function studies No results for input(s): TSH, T4TOTAL, T3FREE, THYROIDAB in the last 72 hours.  Invalid input(s): FREET3 Anemia work up No results for input(s): VITAMINB12, FOLATE, FERRITIN, TIBC, IRON, RETICCTPCT in the last 72 hours. Urinalysis    Component Value Date/Time   COLORURINE YELLOW 09/12/2018 2130   APPEARANCEUR CLEAR 09/12/2018 2130   LABSPEC 1.014 09/12/2018 2130   PHURINE 6.0 09/12/2018 2130   GLUCOSEU 50 (A) 09/12/2018 2130   HGBUR NEGATIVE 09/12/2018 2130   BILIRUBINUR NEGATIVE 09/12/2018 2130   BILIRUBINUR small 09/30/2014 1000   KETONESUR NEGATIVE 09/12/2018 2130   PROTEINUR >=300 (A) 09/12/2018 2130   UROBILINOGEN 0.2 09/30/2014 1000   UROBILINOGEN 1.0 08/19/2013 1250   NITRITE NEGATIVE 09/12/2018 2130   LEUKOCYTESUR NEGATIVE 09/12/2018 2130   Sepsis Labs Invalid input(s): PROCALCITONIN,  WBC,  LACTICIDVEN Microbiology Recent Results (from the past 240 hour(s))  SARS Coronavirus 2 [facility] order, Performed in [facility] hospital lab) Nasopharyngeal Nasopharyngeal Swab     Status: None   Collection Time: 09/12/18  8:44 PM   Specimen: Nasopharyngeal Swab  Result Value Ref Range Status   SARS Coronavirus 2 NEGATIVE NEGATIVE Final    Comment: (NOTE) If result is NEGATIVE SARS-CoV-2 target nucleic acids are NOT DETECTED. The SARS-CoV-2 RNA is generally detectable in upper and lower  respiratory specimens during the acute phase of infection. The lowest  concentration of SARS-CoV-2 viral copies this assay can detect is 250  copies / mL. A negative result does not preclude SARS-CoV-2 infection  and should not be used as the sole basis for treatment or other  patient  management decisions.  A negative result may occur with  improper specimen collection / handling, submission of specimen other  than nasopharyngeal swab, presence of viral mutation(s) within the  areas  targeted by this assay, and inadequate number of viral copies  (<250 copies / mL). A negative result must be combined with clinical  observations, patient history, and epidemiological information. If result is POSITIVE SARS-CoV-2 target nucleic acids are DETECTED. The SARS-CoV-2 RNA is generally detectable in upper and lower  respiratory specimens dur ing the acute phase of infection.  Positive  results are indicative of active infection with SARS-CoV-2.  Clinical  correlation with patient history and other diagnostic information is  necessary to determine patient infection status.  Positive results do  not rule out bacterial infection or co-infection with other viruses. If result is PRESUMPTIVE POSTIVE SARS-CoV-2 nucleic acids MAY BE PRESENT.   A presumptive positive result was obtained on the submitted specimen  and confirmed on repeat testing.  While 2019 novel coronavirus  (SARS-CoV-2) nucleic acids may be present in the submitted sample  additional confirmatory testing may be necessary for epidemiological  and / or clinical management purposes  to differentiate between  SARS-CoV-2 and other Sarbecovirus currently known to infect humans.  If clinically indicated additional testing with an alternate test  methodology [phone]) is advised. The SARS-CoV-2 RNA is generally  detectable in upper and lower respiratory sp ecimens during the acute  phase of infection. The expected result is Negative. Fact Sheet for Patients:  StrictlyIdeas.no Fact Sheet for Healthcare Providers: BankingDealers.co.za This test is not yet approved or cleared by the [place] FDA and has been authorized for detection and/or diagnosis of SARS-CoV-2 by FDA under  an Emergency Use Authorization (EUA).  This EUA will remain in effect (meaning this test can be used) for the duration of the COVID-19 declaration under Section 564(b)(1) of the Act, 21 U.S.C. section 360bbb-3(b)(1), unless the authorization is terminated or revoked sooner. Performed at [facility], [place] [address], [place], [place] [address]   Blood culture (routine x 2)     Status: None   Collection Time: 09/12/18 11:05 PM   Specimen: BLOOD LEFT HAND  Result Value Ref Range Status   Specimen Description BLOOD LEFT HAND  Final   Special Requests   Final    BOTTLES DRAWN AEROBIC ONLY Blood Culture results may not be optimal due to an inadequate volume of blood received in culture bottles   Culture   Final    NO GROWTH 5 DAYS Performed at [facility], [place] [address], [place], [place] [address]    Report Status 09/17/2018 FINAL  Final  MRSA PCR Screening     Status: None   Collection Time: 09/12/18 11:57 PM   Specimen: Nasal Mucosa; Nasopharyngeal  Result Value Ref Range Status   MRSA by PCR NEGATIVE NEGATIVE Final    Comment:        The GeneXpert MRSA Assay (FDA approved for NASAL specimens only), is one component of a comprehensive MRSA colonization surveillance program. It is not intended to diagnose MRSA infection nor to guide or monitor treatment for MRSA infections. Performed at [facility], [place] [address], [place], [place] [address]   Culture, blood (Routine X 2) w Reflex to ID Panel     Status: None   Collection Time: 09/13/18  1:27 AM   Specimen: BLOOD RIGHT HAND  Result Value Ref Range Status   Specimen Description BLOOD RIGHT HAND  Final   Special Requests   Final    BOTTLES DRAWN AEROBIC ONLY Blood Culture results may not be optimal due to an inadequate volume of blood received in culture  bottles   Culture   Final    NO GROWTH 5 DAYS Performed at [facility], [place] [address], [place], [place] [address]    Report Status 09/18/2018  FINAL  Final     Time coordinating discharge: 40 minutes  SIGNED:   Elmarie Shiley, MD  [facility]

## 2018-09-18 NOTE — Plan of Care (Signed)
Plan of care adequate for discharge.

## 2018-09-18 NOTE — TOC Transition Note (Signed)
Transition of Care [facility]) - CM/SW Discharge Note   Patient Details  Name: [name] MRN: [id number] Date of Birth: 1950/03/26  Transition of Care [facility]) CM/SW Contact:  Geralynn Ochs, LCSW Phone Number: 09/18/2018, 11:55 AM   Clinical Narrative:   Nurse to call report to [phone], Room 604P  Transport set for 2:00 PM.    Final next level of care: Skilled Nursing Facility Barriers to Discharge: Barriers Resolved   Patient Goals and CMS Choice Patient states their goals for this hospitalization and ongoing recovery are:: patient to get better      Discharge Placement              Patient chooses bed at: [facility] Patient to be transferred to facility by: [place] Name of family member notified: Husband Patient and family notified of of transfer: 09/18/18  Discharge Plan and Services In-house Referral: Clinical Social Work                                   Social Determinants of Health (SDOH) Interventions     Readmission Risk Interventions No flowsheet data found.

## 2018-09-18 NOTE — Plan of Care (Signed)
Progressing toward goals. 

## 2018-09-18 NOTE — Progress Notes (Signed)
[place] for Warfarin Indication: history of stroke  No Known Allergies  Patient Measurements: Height: 5\' 2"  (157.5 cm) Weight: 145 lb 11.6 oz (66.1 kg) IBW/kg (Calculated) : 50.1  Vital Signs: Temp: 99 F (37.2 C) (08/27 0756) Temp Source: Oral (08/27 0756) BP: 141/78 (08/27 0756) Pulse Rate: 97 (08/27 0756)  Labs: Recent Labs    09/16/18 0524 09/17/18 0509 09/18/18 0433 09/18/18 0724  HGB  --  13.9  --   --   HCT  --  42.3  --   --   PLT  --  333  --   --   LABPROT 27.4* 29.2* 27.9*  --   INR 2.6* 2.8* 2.7*  --   CREATININE  --  0.93  --  0.84    Estimated Creatinine Clearance: 56.4 mL/min (by C-G formula based on SCr of 0.84 mg/dL).   Medical History: Past Medical History:  Diagnosis Date  . Adenomatous polyp of colon 09/2012   repeat colonoscopy in 5 years by Dr. Sharlett Iles  . CHF (congestive heart failure) (HCC)    EF 15-20% as of 05/28/11 (Dr [facility] summary)  . CVA (cerebral infarction)   . Hemiparesis ([place])   . Hyperlipidemia   . Hypertension   . Seizures ([place])   . Sickle cell anemia (HCC)   . Stroke ([place])   . Vitamin D deficiency 2010    Assessment: 68 year old female admitted 8/21 after witnessed seizure at home. Patient had run out of Keppra medication for 4 days. Last dose of Warfarin was on 8/19 PTA per husband. CT Head was negative for acute abnormality. CCM confirmed ok for warfarin restart and patient has now passed swallow evaluation.  PTA dosing: 5mg  daily  INR therapeutic at 2.7 today, stable. CBC wnl and stable. No active bleed issues documented. Planning to wean phenytoin off, planned for 5 more doses.  Goal of Therapy:  INR 2-3 Monitor platelets by anticoagulation protocol: Yes   Plan:  Warfarin 5mg  PO x1 tonight Monitor daily INR, CBC, s/sx bleeding D/c orders in for SNF, unsure if patient will transition today per RN  Elicia Lamp, PharmD, BCPS Please check AMION for all  [place] contact numbers Clinical Pharmacist 09/18/2018 11:48 AM

## 2018-09-18 NOTE — Progress Notes (Signed)
Patient being discharged to [facility].  IV removed with the catheter intact. Discharge instructions and prescription placed in packet. Report called to [facility].

## 2018-09-18 NOTE — Progress Notes (Signed)
Physician notified of troponin level

## 2018-09-18 NOTE — Care Management Important Message (Signed)
Important Message  Patient Details  Name: [name] MRN: [id number] Date of Birth: [birth year]   Medicare Important Message Given:  Yes   Patients spouse signed on her behalf.    [name] 09/18/2018, 4:01 PM

## 2018-09-18 NOTE — Progress Notes (Signed)
Physician made aware via amion of patient complaining of chest pain.

## 2018-09-19 ENCOUNTER — Inpatient Hospital Stay (HOSPITAL_COMMUNITY): Payer: Medicare HMO

## 2018-09-19 DIAGNOSIS — I248 Other forms of acute ischemic heart disease: Secondary | ICD-10-CM

## 2018-09-19 DIAGNOSIS — I351 Nonrheumatic aortic (valve) insufficiency: Secondary | ICD-10-CM

## 2018-09-19 LAB — GLUCOSE, CAPILLARY
Glucose-Capillary: 98 mg/dL (ref 70–99)
Glucose-Capillary: 105 mg/dL — ABNORMAL HIGH (ref 70–99)
Glucose-Capillary: 112 mg/dL — ABNORMAL HIGH (ref 70–99)
Glucose-Capillary: 177 mg/dL — ABNORMAL HIGH (ref 70–99)
Glucose-Capillary: 116 mg/dL — ABNORMAL HIGH (ref 70–99)

## 2018-09-19 LAB — CBC
HCT: 39.9 % (ref 36.0–46.0)
Hemoglobin: 13 g/dL (ref 12.0–15.0)
MCH: 28 pg (ref 26.0–34.0)
MCHC: 32.6 g/dL (ref 30.0–36.0)
MCV: 86 fL (ref 80.0–100.0)
Platelets: 275 10*3/uL (ref 150–400)
RBC: 4.64 MIL/uL (ref 3.87–5.11)
RDW: 14 % (ref 11.5–15.5)
WBC: 8.8 10*3/uL (ref 4.0–10.5)
nRBC: 0 % (ref 0.0–0.2)

## 2018-09-19 LAB — BASIC METABOLIC PANEL
Anion gap: 13 (ref 5–15)
BUN: 16 mg/dL (ref 8–23)
CO2: 22 mmol/L (ref 22–32)
Calcium: 8.7 mg/dL — ABNORMAL LOW (ref 8.9–10.3)
Chloride: 102 mmol/L (ref 98–111)
Creatinine, Ser: 1.03 mg/dL — ABNORMAL HIGH (ref 0.44–1.00)
GFR calc Af Amer: >60 mL/min (ref >60)
GFR calc non Af Amer: 55 mL/min — ABNORMAL LOW (ref >60)
Glucose, Bld: 98 mg/dL (ref 70–99)
Potassium: 4.3 mmol/L (ref 3.5–5.1)
Sodium: 137 mmol/L (ref 135–145)

## 2018-09-19 LAB — PROTIME-INR
INR: 2.6 — ABNORMAL HIGH (ref 0.8–1.2)
Prothrombin Time: 27.4 seconds — ABNORMAL HIGH (ref 11.4–15.2)

## 2018-09-19 MED ORDER — FUROSEMIDE 10 MG/ML IJ SOLN
40.0000 mg | Freq: Once | INTRAMUSCULAR | Status: AC
  Administered 2018-09-19: 40 mg via INTRAVENOUS
  Filled 2018-09-19: qty 4

## 2018-09-19 MED ORDER — FUROSEMIDE 10 MG/ML IJ SOLN
40.0000 mg | Freq: Every day | INTRAMUSCULAR | Status: DC
  Administered 2018-09-20: 40 mg via INTRAVENOUS
  Filled 2018-09-19: qty 4

## 2018-09-19 MED ORDER — WARFARIN SODIUM 5 MG PO TABS
5.0000 mg | ORAL_TABLET | Freq: Once | ORAL | Status: AC
  Administered 2018-09-19: 5 mg via ORAL
  Filled 2018-09-19: qty 1

## 2018-09-19 MED ORDER — POTASSIUM CHLORIDE CRYS ER 20 MEQ PO TBCR
40.0000 meq | EXTENDED_RELEASE_TABLET | Freq: Once | ORAL | Status: AC
  Administered 2018-09-19: 40 meq via ORAL
  Filled 2018-09-19: qty 2

## 2018-09-19 NOTE — TOC Progression Note (Signed)
Transition of Care [facility]) - Progression Note    Patient Details  Name: [name] MRN: [id number] Date of Birth: [birth year]  Transition of Care [facility]) CM/SW [place], [place] Phone Number: 09/19/2018, 4:56 PM  Clinical Narrative:   CSW notified this morning by RN that patient did not discharge last night due to chest pain. Awaiting further tests and workup today, not stable for discharge to [facility]. CSW updated Admissions at [facility] and confirmed that bed would be available for the patient when stable. CSW to follow.    Expected Discharge Plan: [place] Barriers to Discharge: Barriers Resolved  Expected Discharge Plan and Services Expected Discharge Plan: [place] In-house Referral: Clinical Social Work     Living arrangements for the past 2 months: Single Family Home Expected Discharge Date: 09/18/18                                     Social Determinants of Health (SDOH) Interventions    Readmission Risk Interventions No flowsheet data found.

## 2018-09-19 NOTE — Progress Notes (Signed)
Physical Therapy Treatment Patient Details Name: [name] MRN: [id number] DOB: April 26, 1950 Today's Date: 09/19/2018    History of Present Illness Patient is a 68 y/o female who presents with seizures in the setting of noncompliance. Head CT-unremarkable. Intubated to protect airway 8/21-8/22. PMH includes right hemiparesis, aphasia from prior stroke,HTN, HLD, CHF.    PT Comments    Patient seen for mobility progression. Continue to progress as tolerated with anticipated d/c to SNF for further skilled PT services.     Follow Up Recommendations  SNF;Supervision for mobility/OOB;Supervision/Assistance - 24 hour     Equipment Recommendations  Other (comment)(defer)    Recommendations for Other Services       Precautions / Restrictions Precautions Precautions: Fall Precaution Comments: seizures; right hemi (baseline but worsened); extensor tone RLE Restrictions Weight Bearing Restrictions: No    Mobility  Bed Mobility Overal bed mobility: Needs Assistance Bed Mobility: Supine to Sit     Supine to sit: Min assist     General bed mobility comments: pt able to get into sitting with min guard and then requires assistance to scoot L hip to EOB with bed pad  Transfers Overall transfer level: Needs assistance Equipment used: 1 person hand held assist Transfers: Sit to/from [facility] Sit to Stand: Mod assist Stand pivot transfers: Mod assist;+2 safety/equipment       General transfer comment: assist to stand and for balance when pivoting L foot; pt with minimal use of R LE during transfers  Ambulation/Gait             General Gait Details: Unable   Stairs             Wheelchair Mobility    Modified Rankin (Stroke Patients Only) Modified Rankin (Stroke Patients Only) Pre-Morbid Rankin Score: Moderately severe disability Modified Rankin: Severe disability     Balance Overall balance assessment: Needs assistance Sitting-balance  support: Feet supported;Single extremity supported Sitting balance-Leahy Scale: Fair   Postural control: Posterior lean Standing balance support: During functional activity;Single extremity supported Standing balance-Leahy Scale: Poor                              Cognition Arousal/Alertness: Awake/alert Behavior During Therapy: WFL for tasks assessed/performed;Impulsive Overall Cognitive Status: Difficult to assess                                 General Comments: Unintelligible speech, inconsistent yes/no responses. Follows motor commands and gestural cues      Exercises      General Comments        Pertinent Vitals/Pain Pain Assessment: No/denies pain Faces Pain Scale: No hurt    Home Living                      Prior Function            PT Goals (current goals can now be found in the care plan section) Acute Rehab PT Goals Patient Stated Goal: pt unable to state due to aphasia Progress towards PT goals: Progressing toward goals    Frequency    Min 3X/week      PT Plan Current plan remains appropriate    Co-evaluation              AM-PAC PT "6 Clicks" Mobility   Outcome Measure  Help needed turning from your back to  your side while in a flat bed without using bedrails?: A Little Help needed moving from lying on your back to sitting on the side of a flat bed without using bedrails?: A Lot Help needed moving to and from a bed to a chair (including a wheelchair)?: A Lot Help needed standing up from a chair using your arms (e.g., wheelchair or bedside chair)?: A Lot Help needed to walk in hospital room?: Total Help needed climbing 3-5 steps with a railing? : Total 6 Click Score: 11    End of Session Equipment Utilized During Treatment: Gait belt Activity Tolerance: Patient tolerated treatment well Patient left: in chair;with call bell/phone within reach;with chair alarm set Nurse Communication: Mobility  status PT Visit Diagnosis: Muscle weakness (generalized) (M62.81);Hemiplegia and hemiparesis Hemiplegia - Right/Left: Right Hemiplegia - dominant/non-dominant: Dominant Hemiplegia - caused by: Unspecified     Time: [id number] PT Time Calculation (min) (ACUTE ONLY): 16 min  Charges:  $Gait Training: 8-22 mins                     Earney Navy, PTA Acute Rehabilitation Services Pager: [phone] Office: [phone]     Darliss Cheney 09/19/2018, 5:22 PM

## 2018-09-19 NOTE — Consult Note (Signed)
Cardiology Consultation:   Patient ID: [name] MRN: [id number]; DOB: 01-05-1951  Admit date: 09/12/2018 Date of Consult: 09/19/2018  Primary Care Provider: Rutherford Guys, MD Primary Cardiologist: Skeet Latch, MD (new) Primary Electrophysiologist:  None    Patient Profile:   [name] is a 68 y.o. female with a hx of seizure disorder, stroke and R hemiplegia who is being seen today for the evaluation of acute systolic heart failure and elevated troponin at the request of Dr. Tyrell Antonio.  History of Present Illness:   [name] was admitted with a witnessed seizure.  She ran out of her medications and was unable to obtain refills for 4 days.  Her husband witnessed seizure activity on the day of admission.  She was brought to the emergency department where she was intubated for airway protection.  Initial labs on 8/21 revealed high-sensitivity troponin of 68 which increased to 1905 the following day.  EKG revealed sinus tachycardia with an incomplete right bundle branch block, left ventricular hypertrophy and repolarization abnormality.    On the evening of 8/27 [name] was preparing for discharge when she developed chest pain.  EKG at that time revealed sinus rhythm with RBBB/LAFB and anterior, lateral and inferior TWI.  Inferior and lateral T waves were not present on her admission EKG.  Repeat hs-troponin was 86.  An echo was obtained, which revealed LVEF 35-40% with global hypokinesis and moderate LVH.  She also had moderate aortic regurgitation.  [name] currently denies chest pain.  She does not recall having chest pain last night or ever.  She also denies shortness of breath, lower extremity edema, orthopnea or PND.  She has R hemiplegia at baseline.  She had a echo 05/2011 that revealed LVEF 50% with grade 1 diastolic dysfunction and moderate aortic regurgitation.     Past Medical History:  Diagnosis Date  . Adenomatous polyp of colon 09/2012   repeat colonoscopy  in 5 years by Dr. Sharlett Iles  . CHF (congestive heart failure) (HCC)    EF 15-20% as of 05/28/11 (Dr Legrand [facility] D/C summary)  . CVA (cerebral infarction)   . Hemiparesis ([place])   . Hyperlipidemia   . Hypertension   . Seizures ([place])   . Sickle cell anemia (HCC)   . Stroke ([place])   . Vitamin D deficiency 2010    Past Surgical History:  Procedure Laterality Date  . ABDOMINAL HYSTERECTOMY    . FRACTURE SURGERY    . HIP SURGERY    . TUBAL LIGATION       Home Medications:  Prior to Admission medications   Medication Sig Start Date End Date Taking? Authorizing Provider  capsicum oleoresin (TRIXAICIN) 0.025 % cream Apply 1 application topically daily as needed (arthiritis pain).   Yes [provider]  nystatin (NYSTATIN) powder Apply topically daily. Apply under breasts once daily after bath   Yes [provider]  alendronate (FOSAMAX) 70 MG tablet TAKE 1 TABLET EVERY 7 DAYS WITH FULL GLASS OF WATER ON AN EMPTY STOMACH 03/04/18   [name], MD  atorvastatin (LIPITOR) 80 MG tablet Take 1 tablet (80 mg total) by mouth at bedtime. 09/10/18   Rutherford Guys, MD  baclofen (LIORESAL) 20 MG tablet TAKE 1 TABLET THREE TIMES DAILY (MAY TAKE AN ADDITIONAL TABLET MID-DAY OR BEFORE BED AS NEEDED FOR MUSCLE SPASMS) 06/09/18   Rutherford Guys, MD  carvedilol (COREG) 6.25 MG tablet Take 1 tablet (6.25 mg total) by mouth 2 (two) times daily. 09/10/18  Rutherford Guys, MD  digoxin (LANOXIN) 0.125 MG tablet TAKE 1 TABLET (125 MCG TOTAL) BY MOUTH DAILY. 09/03/18   Rutherford Guys, MD  docusate sodium (COLACE) 50 MG capsule Take 1 capsule (50 mg total) by mouth daily. Patient not taking: Reported on 09/13/2018 03/26/14   [name], MD  hydrocortisone (ANUSOL-HC) 25 MG suppository Place 1 suppository (25 mg total) rectally 2 (two) times daily as needed for hemorrhoids or itching. Patient not taking: Reported on 09/13/2018 04/16/16   [name], MD  lacosamide 100 MG TABS Take 1 tablet  (100 mg total) by mouth 2 (two) times daily. 09/18/18   Regalado, Belkys A, MD  levETIRAcetam (KEPPRA) 500 MG tablet Take 1 tablet (500 mg total) by mouth 2 (two) times daily. 09/10/18   Rutherford Guys, MD  levETIRAcetam (KEPPRA) 750 MG tablet Take 2 tablets (1,500 mg total) by mouth 2 (two) times daily. 09/18/18 10/18/18  Regalado, Cassie Freer, MD  Misc. Devices [facility]) MISC 1 Units by Does not apply route as needed. Reason: I69.959, Marylen Ponto, M21.371, M24.50 09/30/17   [name], MD  phenytoin (DILANTIN) 125 MG/5ML suspension Phenytoin Morning Evening 8/26-8/27 120mg   60mg  8/28-8/29 60mg              60mg  8/30-8/31 60mg              30mg  9/1-9/2 30mg  30mg  9/3-9/4 30mg  STOP 9/5 STOP STOP 09/18/18   Regalado, Belkys A, MD  polyethylene glycol (MIRALAX / GLYCOLAX) packet Take 17 g by mouth daily as needed for moderate constipation.    [provider]  triamcinolone cream (KENALOG) 0.1 % Apply 1 application topically 2 (two) times daily. Patient not taking: Reported on 09/13/2018 03/26/14   [name], MD  Vitamin D, Ergocalciferol, (DRISDOL) 1.25 MG (50000 UT) CAPS capsule TAKE 1 CAPSULE EVERY 7 DAYS 09/04/18   Rutherford Guys, MD  warfarin (COUMADIN) 5 MG tablet TAKE AS DIRECTED BY COUMADIN CLINIC. NEED OFFICE VISIT WITH FASTING LABS FOR REFILLS Patient taking differently: Take 5 mg by mouth one time only at 6 PM. Take 1 tablet (5mg ) po daily at [facility]. 09/03/18   Rutherford Guys, MD    Inpatient Medications: Scheduled Meds: . atorvastatin  40 mg Oral q1800  . carvedilol  6.25 mg Oral BID  . Chlorhexidine Gluconate Cloth  6 each Topical Q0600  . [START ON 09/20/2018] furosemide  40 mg Intravenous Daily  . insulin aspart  0-15 Units Subcutaneous Q4H  . lacosamide  100 mg Oral BID  . levETIRAcetam  1,500 mg Oral BID  . pantoprazole  40 mg Oral Daily  . [START ON 09/21/2018] phenytoin  30 mg Oral QHS  . [START ON 09/23/2018] phenytoin  30 mg Oral q morning - 10a  . phenytoin  60 mg Oral  QHS  . phenytoin  60 mg Oral q morning - 10a  . sodium chloride flush  10-40 mL Intracatheter Q12H  . Warfarin - Pharmacist Dosing Inpatient   Does not apply q1800   Continuous Infusions:  PRN Meds: acetaminophen, fentaNYL (SUBLIMAZE) injection, fentaNYL (SUBLIMAZE) injection, nitroGLYCERIN, ondansetron (ZOFRAN) IV, sodium chloride flush  Allergies:   No Known Allergies  Social History:   Social History   Socioeconomic History  . Marital status: Married    Spouse name: Not on file  . Number of children: 2  . Years of education: 74  . Highest education level: Not on file  Occupational History    Employer: RETIRED  Social Needs  . Financial resource strain: Not on file  . Food insecurity    Worry: Not on file    Inability: Not on file  . Transportation needs    Medical: Not on file    Non-medical: Not on file  Tobacco Use  . Smoking status: Former Smoker    Types: Cigarettes    Quit date: 09/26/2007    Years since quitting: 10.9  . Smokeless tobacco: Never Used  Substance and Sexual Activity  . Alcohol use: No  . Drug use: No  . Sexual activity: Never  Lifestyle  . Physical activity    Days per week: Not on file    Minutes per session: Not on file  . Stress: Not on file  Relationships  . Social Herbalist on phone: Not on file    Gets together: Not on file    Attends religious service: Not on file    Active member of club or organization: Not on file    Attends meetings of clubs or organizations: Not on file    Relationship status: Not on file  . Intimate partner violence    Fear of current or ex partner: Not on file    Emotionally abused: Not on file    Physically abused: Not on file    Forced sexual activity: Not on file  Other Topics Concern  . Not on file  Social History Narrative  . Not on file    Family History:    Family History  Problem Relation Age of Onset  . Diabetes Daughter   . Colon cancer Maternal Grandfather      ROS:   Please see the history of present illness.   All other ROS reviewed and negative.     Physical Exam/Data:   Vitals:   09/19/18 0859 09/19/18 1625 09/19/18 1941 09/19/18 2323  BP: 139/66 123/72 127/62 127/72  Pulse: 97 99 (!) 102 92  Resp: 20 16 16 16   Temp: 99 F (37.2 C) 98.6 F (37 C) 98.8 F (37.1 C) 98.9 F (37.2 C)  TempSrc: Oral Oral Oral Oral  SpO2: 97% 98% 99% 98%  Weight:      Height:        Intake/Output Summary (Last 24 hours) at 09/19/2018 2331 Last data filed at 09/19/2018 1800 Gross per 24 hour  Intake 600 ml  Output -  Net 600 ml   Last 3 Weights 09/19/2018 09/18/2018 09/17/2018  Weight (lbs) 145 lb 15.1 oz 145 lb 11.6 oz 135 lb 5.8 oz  Weight (kg) 66.2 kg 66.1 kg 61.4 kg     Body mass index is 26.69 kg/m.  General:  Well nourished, well developed, in no acute distress.  Chronically ill-appearing. HEENT: normal Neck: no JVD Endocrine:  No thryomegaly Vascular: No carotid bruits; FA pulses 2+ bilaterally without bruits  Cardiac:  normal S1, S2; RRR; II/VI diastolic murmur at the LUSB Lungs:  clear to auscultation bilaterally, no wheezing, rhonchi or rales  Abd: soft, nontender, no hepatomegaly  Ext: no edema Musculoskeletal:  No deformities, BUE and BLE strength normal and equal Skin: warm and dry  Neuro:  CNs 2-12 intact, R sided weakness.  Speech slurred. Psych:  Normal affect   EKG:  The EKG was personally reviewed and demonstrates:  Sinus rhythm.  Rate 98 bpm.  RBBB.  LAFB.  Inferolateral TWI. Telemetry:  Telemetry was personally reviewed and demonstrates:  Sinus rhythm  Relevant CV Studies:  Echo 09/19/18:  IMPRESSIONS  1. The left ventricle has moderately reduced systolic function, with an ejection fraction of 35-40%. The cavity size was normal. There is moderately increased left ventricular wall thickness. Left ventricular diastolic Doppler parameters are consistent  with impaired relaxation. Elevated left atrial and left ventricular  end-diastolic pressures The E/e' is >20. Left ventricular diffuse hypokinesis.  2. The right ventricle has normal systolic function. The cavity was normal. There is no increase in right ventricular wall thickness.  3. Left atrial size was mildly dilated.  4. The mitral valve is grossly normal.  5. The tricuspid valve is grossly normal.  6. The aortic valve is tricuspid. Moderate sclerosis of the aortic valve. Aortic valve regurgitation is moderate by color flow Doppler. No stenosis of the aortic valve.  7. The aorta is normal unless otherwise noted.  Laboratory Data:  High Sensitivity Troponin:   Recent Labs  Lab 09/13/18 0129 09/13/18 0355 09/13/18 0520 09/18/18 1833 09/18/18 2048  TROPONINIHS 1,074* 1,322* 1,905* 86* 93*     Chemistry Recent Labs  Lab 09/17/18 0509 09/18/18 0724 09/19/18 0405  NA 138 139 137  K 3.9 3.7 4.3  CL 107 105 102  CO2 23 22 22   GLUCOSE 100* 95 98  BUN 12 13 16   CREATININE 0.93 0.84 1.03*  CALCIUM 8.6* 9.0 8.7*  GFRNONAA >60 >60 55*  GFRAA >60 >60 >60  ANIONGAP 8 12 13     Recent Labs  Lab 09/13/18 0130  PROT 6.9  ALBUMIN 3.6  AST 43*  ALT 22  ALKPHOS 98  BILITOT 0.7   Hematology Recent Labs  Lab 09/15/18 0521 09/17/18 0509 09/19/18 0405  WBC 10.8* 9.6 8.8  RBC 4.86 5.01 4.64  HGB 13.6 13.9 13.0  HCT 41.1 42.3 39.9  MCV 84.6 84.4 86.0  MCH 28.0 27.7 28.0  MCHC 33.1 32.9 32.6  RDW 13.8 13.9 14.0  PLT 282 333 275   BNPNo results for input(s): BNP, PROBNP in the last 168 hours.  DDimer No results for input(s): DDIMER in the last 168 hours.   Radiology/Studies:  Dg Chest Port 1 View  Result Date: 09/18/2018 CLINICAL DATA:  Shortness of breath, history of hypertension, CHF, stroke and seizures. EXAM: PORTABLE CHEST 1 VIEW COMPARISON:  Radiograph September 14, 2018 FINDINGS: Lung volumes are diminished. There are hazy interstitial opacities throughout the lungs with fissural and septal thickening as well as some central vascular  cuffing. The pulmonary vascularity is cephalized and indistinct. No consolidative process. No pneumothorax or effusion. Cardiomediastinal contours are unchanged from prior including a tortuous, calcified aorta. No acute osseous or soft tissue abnormality. Vascular calcifications are noted in both axilla. Degenerative changes are present in the imaged spine and shoulders. IMPRESSION: Findings of CHF with interstitial edema. Aortic Atherosclerosis (ICD10-I70.0). Electronically Signed   By: Lovena Le M.D.   On: 09/18/2018 18:55    Assessment and Plan:   # Elevated troponin: # Acute systolic heart failure: [name] has an elevated troponin in the setting of a seizure.  However she had chest pain overnight.  She currently denies chest pain and doesn't recall having chest pain before.  I suspect that her troponin elevated is more related to demand ischemia from her seizure than ACS.  However, echo also shows reduced systolic function.  We will get a Lexiscan Myoview to assess for ischemia.  # CVA: She has residual hemiplegia.  She is on warfarin.  INR 2.6.    # Hypertension:  Continue carvedilol.  # Hyperlipidemia: Continue atorvastatin.  For questions or updates, please contact [place] Please consult [email or web address] for contact info under     Signed, Skeet Latch, MD  09/19/2018 11:31 PM

## 2018-09-19 NOTE — Progress Notes (Signed)
Echocardiogram 2D Echocardiogram has been performed.  [place] Deputy Lenford Beddow 09/19/2018, 1:30 PM

## 2018-09-19 NOTE — Plan of Care (Signed)
Plan of care adequate for discharge.

## 2018-09-19 NOTE — Progress Notes (Signed)
[place] for Warfarin Indication: history of stroke  No Known Allergies  Patient Measurements: Height: 5\' 2"  (157.5 cm) Weight: 145 lb 15.1 oz (66.2 kg) IBW/kg (Calculated) : 50.1  Vital Signs: Temp: 99 F (37.2 C) (08/28 0859) Temp Source: Oral (08/28 0859) BP: 139/66 (08/28 0859) Pulse Rate: 97 (08/28 0859)  Labs: Recent Labs    09/17/18 0509 09/18/18 0433 09/18/18 0724 09/18/18 1833 09/18/18 2048 09/19/18 0405  HGB 13.9  --   --   --   --  13.0  HCT 42.3  --   --   --   --  39.9  PLT 333  --   --   --   --  275  LABPROT 29.2* 27.9*  --   --   --  27.4*  INR 2.8* 2.7*  --   --   --  2.6*  CREATININE 0.93  --  0.84  --   --  1.03*  TROPONINIHS  --   --   --  86* 93*  --     Estimated Creatinine Clearance: 46 mL/min (A) (by C-G formula based on SCr of 1.03 mg/dL (H)).   Medical History: Past Medical History:  Diagnosis Date  . Adenomatous polyp of colon 09/2012   repeat colonoscopy in 5 years by Dr. Sharlett Iles  . CHF (congestive heart failure) (HCC)    EF 15-20% as of 05/28/11 (Dr [facility] summary)  . CVA (cerebral infarction)   . Hemiparesis ([place])   . Hyperlipidemia   . Hypertension   . Seizures ([place])   . Sickle cell anemia (HCC)   . Stroke ([place])   . Vitamin D deficiency 2010    Assessment: 68 year old female admitted 8/21 after witnessed seizure at home. Patient had run out of Keppra medication for 4 days. Last dose of Warfarin was on 8/19 PTA per husband. CT Head was negative for acute abnormality. CCM confirmed ok for warfarin restart and patient has now passed swallow evaluation.  PTA dosing: 5mg  daily  INR therapeutic at 2.6 today, stable. CBC wnl and stable. No active bleed issues documented. Planning to wean phenytoin off, planned for 5 more doses.  Goal of Therapy:  INR 2-3 Monitor platelets by anticoagulation protocol: Yes   Plan:  Warfarin 5mg  PO x1 tonight Monitor daily INR, CBC, s/sx  bleeding D/c orders in for SNF, unsure when patient will transition   Alanda Slim, PharmD, [facility] Clinical Pharmacist Please see AMION for all Pharmacists' Contact Phone Numbers 09/19/2018, 12:56 PM

## 2018-09-19 NOTE — Progress Notes (Signed)
PROGRESS NOTE    [name]  [id number] DOB: 1950/06/29 DOA: 09/12/2018 PCP: Rutherford Guys, MD   Brief Narrative: 68 year old female with a history of seizure and CVA with right-sided hemi-plegia with witnessed seizure earlier today. Patient was urgently intubated due to inability to protect airway. Husband relates that she ran out of all her medications which she obtains by mail approximately 4 days ago when he been unable to obtain refills. She had required assistance to the bathroom this afternoon which is unusual for her and he had left her sitting on the bed only to find her at the side of the bed tremulous apparently seizing. On my evaluation in the emergency room the patient has no overt seizure activity. Neurology has evaluated her here. Patient intubated for airway protection.     Assessment & Plan:   Active Problems:   Seizure disorder (HCC)   Chest pain , Dyspnea;  Patient complaining of chest pain the night of 8/27.  EKG with T wave inversion anterior lead.  Patient started on  nitro, aspirin , lipitor.  Troponin mildly elevated.  Cardiology consulted.   Acute systolic HF exacerbation;  Chest x ray with pulmonary edema.  Started IV lasix daily.   Acute respiratory failure; intubated for airway protection due to status epileptico.   -Extubated 8/22. -pulmonary hygiene.  -Dysphagia 3 diet per speech; will monitor carefully for signs of aspiration - Respiratory status stable.    History of seizure activity in the setting of prior CVA. Unfortunately she had run out of her Keppra at home. She had perceivable seizure activity on EEG until 2 AM -Continue Keppra, Vimpat, Dilantin. Transition to oral.  -Dilantin taper, see ordered.   Acute renal failure, resolved -Continue to monitor urine output and electrolytes -Avoid nephrotoxic meds - Maintain renal perfusion  Hypokalemia, hypophosphatemia -Resolved  Hypertension, probable diastolic  dysfunction - BP is up trending. -increasecoreg back to home dose  History of CVA, on chronic Coumadinfor anticoagulation. Unclear if history of Afib. Discharge summary in 2013 indicates that warfarin was initiated for ventricular hypokinesis associated with HFrEF. -Appreciate pharmacy's assistance. INR therapeutic today. -May need to revisit if this medication is still required. Neurology input appreciated -monitor INR daily.   Nutrition - Dysphagia 1 diet - Aspiration precautions     Estimated body mass index is 26.69 kg/m as calculated from the following:   Height as of this encounter: 5\' 2"  (1.575 m).   Weight as of this encounter: 66.2 kg.   DVT prophylaxis: coumadin Code Status: full code Family Communication: husband over phone Disposition Plan:  Consultants:   Cardiology  Neurology   Procedures:   ECHO; Ef 40 % global hypokinesis.   Antimicrobials:    Subjective: She denies chest pain today. Breathing better.  Aphasic/   Objective: Vitals:   09/18/18 2336 09/19/18 0157 09/19/18 0359 09/19/18 0859  BP: (!) 144/83  136/76 139/66  Pulse: 96  93 97  Resp: 18  18 20   Temp: 98.2 F (36.8 C)  99.2 F (37.3 C) 99 F (37.2 C)  TempSrc: Oral  Oral Oral  SpO2: 96%  98% 97%  Weight:  66.2 kg    Height:        Intake/Output Summary (Last 24 hours) at 09/19/2018 1533 Last data filed at 09/19/2018 1000 Gross per 24 hour  Intake 240 ml  Output 400 ml  Net -160 ml   Filed Weights   09/17/18 0500 09/18/18 0510 09/19/18 0157  Weight: 61.4 kg 66.1 kg  66.2 kg    Examination:  General exam: Appears calm and comfortable  Respiratory system: Bilateral crackles.  Cardiovascular system: S1 & S2 heard, RRR. No JVD, murmurs, rubs, gallops or clicks. No pedal edema. Gastrointestinal system: Abdomen is nondistended, soft and nontender. No organomegaly or masses felt. Normal bowel sounds heard. Central nervous system: Alert chronic aphasic Extremities:  Symmetric 5 x 5 power. Skin: No rashes, lesions or ulcers      Data Reviewed: I have personally reviewed following labs and imaging studies  CBC: Recent Labs  Lab 09/12/18 2022  09/13/18 0127 09/14/18 0732 09/15/18 0521 09/17/18 0509 09/19/18 0405  WBC 18.3*  --  16.1* 11.7* 10.8* 9.6 8.8  NEUTROABS 7.0  --   --   --   --   --   --   HGB 14.0   < > 13.9 15.1* 13.6 13.9 13.0  HCT 47.1*   < > 43.0 45.7 41.1 42.3 39.9  MCV 92.7  --  86.3 85.1 84.6 84.4 86.0  PLT 385  --  262 271 282 333 275   < > = values in this interval not displayed.   Basic Metabolic Panel: Recent Labs  Lab 09/12/18 2022  09/13/18 0127  09/14/18 0732 09/15/18 0521 09/17/18 0509 09/18/18 0724 09/19/18 0405  NA 137   < >  --    < > 139 139 138 139 137  K 5.2*   < >  --    < > 3.4* 3.2* 3.9 3.7 4.3  CL 104  --   --    < > 103 104 107 105 102  CO2 13*  --   --    < > 24 24 23 22 22   GLUCOSE 256*  --   --    < > 86 103* 100* 95 98  BUN 17  --   --    < > 8 9 12 13 16   CREATININE 1.28*  --   --    < > 0.85 0.82 0.93 0.84 1.03*  CALCIUM 8.7*  --   --    < > 8.3* 8.4* 8.6* 9.0 8.7*  MG 2.0  --  1.7  --  1.8 1.8 1.8  --   --   PHOS  --   --  3.7  --   --  1.7* 3.2  --   --    < > = values in this interval not displayed.   GFR: Estimated Creatinine Clearance: 46 mL/min (A) (by C-G formula based on SCr of 1.03 mg/dL (H)). Liver Function Tests: Recent Labs  Lab 09/12/18 2022 09/13/18 0130  AST 62* 43*  ALT 21 22  ALKPHOS 115 98  BILITOT 1.0 0.7  PROT 7.5 6.9  ALBUMIN 3.9 3.6   No results for input(s): LIPASE, AMYLASE in the last 168 hours. No results for input(s): AMMONIA in the last 168 hours. Coagulation Profile: Recent Labs  Lab 09/15/18 0521 09/16/18 0524 09/17/18 0509 09/18/18 0433 09/19/18 0405  INR 2.0* 2.6* 2.8* 2.7* 2.6*   Cardiac Enzymes: No results for input(s): CKTOTAL, CKMB, CKMBINDEX, TROPONINI in the last 168 hours. BNP (last 3 results) No results for input(s): PROBNP in  the last 8760 hours. HbA1C: No results for input(s): HGBA1C in the last 72 hours. CBG: Recent Labs  Lab 09/18/18 1609 09/18/18 2002 09/18/18 2334 09/19/18 0401 09/19/18 0856  GLUCAP 112* 104* 88 98 105*   Lipid Profile: No results for input(s): CHOL, HDL, LDLCALC, TRIG, CHOLHDL, LDLDIRECT in  the last 72 hours. Thyroid Function Tests: No results for input(s): TSH, T4TOTAL, FREET4, T3FREE, THYROIDAB in the last 72 hours. Anemia Panel: No results for input(s): VITAMINB12, FOLATE, FERRITIN, TIBC, IRON, RETICCTPCT in the last 72 hours. Sepsis Labs: Recent Labs  Lab 09/12/18 2022 09/13/18 0127 09/13/18 0355  LATICACIDVEN 9.4* 2.8* 1.9    Recent Results (from the past 240 hour(s))  SARS Coronavirus 2 [facility] order, Performed in [facility] hospital lab) Nasopharyngeal Nasopharyngeal Swab     Status: None   Collection Time: 09/12/18  8:44 PM   Specimen: Nasopharyngeal Swab  Result Value Ref Range Status   SARS Coronavirus 2 NEGATIVE NEGATIVE Final    Comment: (NOTE) If result is NEGATIVE SARS-CoV-2 target nucleic acids are NOT DETECTED. The SARS-CoV-2 RNA is generally detectable in upper and lower  respiratory specimens during the acute phase of infection. The lowest  concentration of SARS-CoV-2 viral copies this assay can detect is 250  copies / mL. A negative result does not preclude SARS-CoV-2 infection  and should not be used as the sole basis for treatment or other  patient management decisions.  A negative result may occur with  improper specimen collection / handling, submission of specimen other  than nasopharyngeal swab, presence of viral mutation(s) within the  areas targeted by this assay, and inadequate number of viral copies  (<250 copies / mL). A negative result must be combined with clinical  observations, patient history, and epidemiological information. If result is POSITIVE SARS-CoV-2 target nucleic acids are DETECTED. The SARS-CoV-2 RNA is generally  detectable in upper and lower  respiratory specimens dur ing the acute phase of infection.  Positive  results are indicative of active infection with SARS-CoV-2.  Clinical  correlation with patient history and other diagnostic information is  necessary to determine patient infection status.  Positive results do  not rule out bacterial infection or co-infection with other viruses. If result is PRESUMPTIVE POSTIVE SARS-CoV-2 nucleic acids MAY BE PRESENT.   A presumptive positive result was obtained on the submitted specimen  and confirmed on repeat testing.  While 2019 novel coronavirus  (SARS-CoV-2) nucleic acids may be present in the submitted sample  additional confirmatory testing may be necessary for epidemiological  and / or clinical management purposes  to differentiate between  SARS-CoV-2 and other Sarbecovirus currently known to infect humans.  If clinically indicated additional testing with an alternate test  methodology [phone]) is advised. The SARS-CoV-2 RNA is generally  detectable in upper and lower respiratory sp ecimens during the acute  phase of infection. The expected result is Negative. Fact Sheet for Patients:  StrictlyIdeas.no Fact Sheet for Healthcare Providers: BankingDealers.co.za This test is not yet approved or cleared by the [place] FDA and has been authorized for detection and/or diagnosis of SARS-CoV-2 by FDA under an Emergency Use Authorization (EUA).  This EUA will remain in effect (meaning this test can be used) for the duration of the COVID-19 declaration under Section 564(b)(1) of the Act, 21 U.S.C. section 360bbb-3(b)(1), unless the authorization is terminated or revoked sooner. Performed at [facility], [place] [address], [place],  [address]   Blood culture (routine x 2)     Status: None   Collection Time: 09/12/18 11:05 PM   Specimen: BLOOD LEFT HAND  Result Value Ref Range Status    Specimen Description BLOOD LEFT HAND  Final   Special Requests   Final    BOTTLES DRAWN AEROBIC ONLY Blood Culture results may not be optimal  due to an inadequate volume of blood received in culture bottles   Culture   Final    NO GROWTH 5 DAYS Performed at [facility], [place] [address], [place], [place] [address]    Report Status 09/17/2018 FINAL  Final  MRSA PCR Screening     Status: None   Collection Time: 09/12/18 11:57 PM   Specimen: Nasal Mucosa; Nasopharyngeal  Result Value Ref Range Status   MRSA by PCR NEGATIVE NEGATIVE Final    Comment:        The GeneXpert MRSA Assay (FDA approved for NASAL specimens only), is one component of a comprehensive MRSA colonization surveillance program. It is not intended to diagnose MRSA infection nor to guide or monitor treatment for MRSA infections. Performed at [facility], [place] [address], [place], [place] [address]   Culture, blood (Routine X 2) w Reflex to ID Panel     Status: None   Collection Time: 09/13/18  1:27 AM   Specimen: BLOOD RIGHT HAND  Result Value Ref Range Status   Specimen Description BLOOD RIGHT HAND  Final   Special Requests   Final    BOTTLES DRAWN AEROBIC ONLY Blood Culture results may not be optimal due to an inadequate volume of blood received in culture bottles   Culture   Final    NO GROWTH 5 DAYS Performed at [facility], [place] [address] [place], [place] [address]    Report Status 09/18/2018 FINAL  Final  Novel Coronavirus, NAA (hospital order; send-out to ref lab)     Status: None   Collection Time: 09/17/18  4:55 PM   Specimen: Nasopharyngeal Swab; Respiratory  Result Value Ref Range Status   SARS-CoV-2, NAA NOT DETECTED NOT DETECTED Final    Comment: (NOTE) This test was developed and its performance characteristics determined by [employer]. This test has not been FDA cleared or approved. This test has been authorized by FDA under an Emergency Use Authorization (EUA).  This test is only authorized for the duration of time the declaration that circumstances exist justifying the authorization of the emergency use of in vitro diagnostic tests for detection of SARS-CoV-2 virus and/or diagnosis of COVID-19 infection under section 564(b)(1) of the Act, 21 U.S.C. KA:123727), unless the authorization is terminated or revoked sooner. When diagnostic testing is negative, the possibility of a false negative result should be considered in the context of a patient's recent exposures and the presence of clinical signs and symptoms consistent with COVID-19. An individual without symptoms of COVID-19 and who is not shedding SARS-CoV-2 virus would expect to have a negative (not detected) result in this assay. Performed  At: [facility] [address] [place], [place] [id number] Rush Farmer MD [id number]    [place]  Final    Comment: Performed at [facility], [place] [address] [place], [place] [address]         Radiology Studies: Dg Chest [place] 1 View  Result Date: 09/18/2018 CLINICAL DATA:  Shortness of breath, history of hypertension, CHF, stroke and seizures. EXAM: PORTABLE CHEST 1 VIEW COMPARISON:  Radiograph September 14, 2018 FINDINGS: Lung volumes are diminished. There are hazy interstitial opacities throughout the lungs with fissural and septal thickening as well as some central vascular cuffing. The pulmonary vascularity is cephalized and indistinct. No consolidative process. No pneumothorax or effusion. Cardiomediastinal contours are unchanged from prior including a tortuous, calcified aorta. No acute osseous or soft tissue abnormality. Vascular calcifications are  noted in both axilla. Degenerative changes are present in the imaged spine and shoulders. IMPRESSION: Findings of CHF with interstitial edema. Aortic Atherosclerosis (ICD10-I70.0). Electronically Signed   By: Lovena Le M.D.   On: 09/18/2018 18:55         Scheduled Meds: . atorvastatin  40 mg Oral q1800  . carvedilol  6.25 mg Oral BID  . Chlorhexidine Gluconate Cloth  6 each Topical Q0600  . insulin aspart  0-15 Units Subcutaneous Q4H  . lacosamide  100 mg Oral BID  . levETIRAcetam  1,500 mg Oral BID  . pantoprazole  40 mg Oral Daily  . [START ON 09/21/2018] phenytoin  30 mg Oral QHS  . [START ON 09/23/2018] phenytoin  30 mg Oral q morning - 10a  . phenytoin  60 mg Oral QHS  . phenytoin  60 mg Oral q morning - 10a  . sodium chloride flush  10-40 mL Intracatheter Q12H  . warfarin  5 mg Oral ONCE-1800  . Warfarin - Pharmacist Dosing Inpatient   Does not apply q1800   Continuous Infusions:   LOS: 7 days    Time spent: 35 minutes    Elmarie Shiley, MD [facility] Pager [phone]  If 7PM-7AM, please contact night-coverage [email or web address] Password [facility] 09/19/2018, 3:33 PM

## 2018-09-19 NOTE — Plan of Care (Signed)
Progressing towards goals

## 2018-09-20 ENCOUNTER — Inpatient Hospital Stay (HOSPITAL_COMMUNITY): Payer: Medicare HMO

## 2018-09-20 DIAGNOSIS — I69991 Dysphagia following unspecified cerebrovascular disease: Secondary | ICD-10-CM | POA: Diagnosis not present

## 2018-09-20 DIAGNOSIS — G8191 Hemiplegia, unspecified affecting right dominant side: Secondary | ICD-10-CM | POA: Diagnosis not present

## 2018-09-20 DIAGNOSIS — R531 Weakness: Secondary | ICD-10-CM | POA: Diagnosis not present

## 2018-09-20 DIAGNOSIS — M6281 Muscle weakness (generalized): Secondary | ICD-10-CM | POA: Diagnosis not present

## 2018-09-20 DIAGNOSIS — I6389 Other cerebral infarction: Secondary | ICD-10-CM | POA: Diagnosis not present

## 2018-09-20 DIAGNOSIS — I6789 Other cerebrovascular disease: Secondary | ICD-10-CM | POA: Diagnosis not present

## 2018-09-20 DIAGNOSIS — R2681 Unsteadiness on feet: Secondary | ICD-10-CM | POA: Diagnosis not present

## 2018-09-20 DIAGNOSIS — G4089 Other seizures: Secondary | ICD-10-CM | POA: Diagnosis not present

## 2018-09-20 DIAGNOSIS — R0902 Hypoxemia: Secondary | ICD-10-CM | POA: Diagnosis not present

## 2018-09-20 DIAGNOSIS — Z7401 Bed confinement status: Secondary | ICD-10-CM | POA: Diagnosis not present

## 2018-09-20 DIAGNOSIS — I248 Other forms of acute ischemic heart disease: Secondary | ICD-10-CM | POA: Diagnosis not present

## 2018-09-20 DIAGNOSIS — R41841 Cognitive communication deficit: Secondary | ICD-10-CM | POA: Diagnosis not present

## 2018-09-20 DIAGNOSIS — I69051 Hemiplegia and hemiparesis following nontraumatic subarachnoid hemorrhage affecting right dominant side: Secondary | ICD-10-CM | POA: Diagnosis not present

## 2018-09-20 DIAGNOSIS — I5021 Acute systolic (congestive) heart failure: Secondary | ICD-10-CM | POA: Diagnosis not present

## 2018-09-20 DIAGNOSIS — I69959 Hemiplegia and hemiparesis following unspecified cerebrovascular disease affecting unspecified side: Secondary | ICD-10-CM | POA: Diagnosis not present

## 2018-09-20 DIAGNOSIS — G40901 Epilepsy, unspecified, not intractable, with status epilepticus: Secondary | ICD-10-CM | POA: Diagnosis not present

## 2018-09-20 DIAGNOSIS — R569 Unspecified convulsions: Secondary | ICD-10-CM | POA: Diagnosis not present

## 2018-09-20 DIAGNOSIS — Z7901 Long term (current) use of anticoagulants: Secondary | ICD-10-CM | POA: Diagnosis not present

## 2018-09-20 DIAGNOSIS — I10 Essential (primary) hypertension: Secondary | ICD-10-CM | POA: Diagnosis not present

## 2018-09-20 DIAGNOSIS — R1312 Dysphagia, oropharyngeal phase: Secondary | ICD-10-CM | POA: Diagnosis not present

## 2018-09-20 DIAGNOSIS — N178 Other acute kidney failure: Secondary | ICD-10-CM | POA: Diagnosis not present

## 2018-09-20 DIAGNOSIS — M2550 Pain in unspecified joint: Secondary | ICD-10-CM | POA: Diagnosis not present

## 2018-09-20 DIAGNOSIS — W1789XA Other fall from one level to another, initial encounter: Secondary | ICD-10-CM | POA: Diagnosis not present

## 2018-09-20 DIAGNOSIS — Z5181 Encounter for therapeutic drug level monitoring: Secondary | ICD-10-CM | POA: Diagnosis not present

## 2018-09-20 DIAGNOSIS — I5041 Acute combined systolic (congestive) and diastolic (congestive) heart failure: Secondary | ICD-10-CM | POA: Diagnosis not present

## 2018-09-20 DIAGNOSIS — J9601 Acute respiratory failure with hypoxia: Secondary | ICD-10-CM | POA: Diagnosis not present

## 2018-09-20 DIAGNOSIS — I428 Other cardiomyopathies: Secondary | ICD-10-CM | POA: Diagnosis not present

## 2018-09-20 DIAGNOSIS — R278 Other lack of coordination: Secondary | ICD-10-CM | POA: Diagnosis not present

## 2018-09-20 DIAGNOSIS — I5042 Chronic combined systolic (congestive) and diastolic (congestive) heart failure: Secondary | ICD-10-CM

## 2018-09-20 DIAGNOSIS — R2689 Other abnormalities of gait and mobility: Secondary | ICD-10-CM | POA: Diagnosis not present

## 2018-09-20 DIAGNOSIS — Z7189 Other specified counseling: Secondary | ICD-10-CM | POA: Diagnosis not present

## 2018-09-20 LAB — BASIC METABOLIC PANEL
Anion gap: 11 (ref 5–15)
BUN: 22 mg/dL (ref 8–23)
CO2: 24 mmol/L (ref 22–32)
Calcium: 8.6 mg/dL — ABNORMAL LOW (ref 8.9–10.3)
Chloride: 103 mmol/L (ref 98–111)
Creatinine, Ser: 1 mg/dL (ref 0.44–1.00)
GFR calc Af Amer: >60 mL/min (ref >60)
GFR calc non Af Amer: 57 mL/min — ABNORMAL LOW (ref >60)
Glucose, Bld: 92 mg/dL (ref 70–99)
Potassium: 4 mmol/L (ref 3.5–5.1)
Sodium: 138 mmol/L (ref 135–145)

## 2018-09-20 LAB — NM MYOCAR MULTI W/SPECT W/WALL MOTION / EF
Estimated workload: 1 METS
Exercise duration (min): 0 min
Exercise duration (sec): 0 s
Peak HR: 112 {beats}/min
Rest HR: 90 {beats}/min

## 2018-09-20 LAB — PROTIME-INR
INR: 2.7 — ABNORMAL HIGH (ref 0.8–1.2)
Prothrombin Time: 28.3 seconds — ABNORMAL HIGH (ref 11.4–15.2)

## 2018-09-20 LAB — ECHOCARDIOGRAM COMPLETE
Height: 62 in
Weight: 2335.11 oz

## 2018-09-20 LAB — GLUCOSE, CAPILLARY: Glucose-Capillary: 87 mg/dL (ref 70–99)

## 2018-09-20 MED ORDER — LOSARTAN POTASSIUM 25 MG PO TABS
25.0000 mg | ORAL_TABLET | Freq: Every day | ORAL | Status: DC
  Administered 2018-09-20: 25 mg via ORAL
  Filled 2018-09-20: qty 1

## 2018-09-20 MED ORDER — FUROSEMIDE 40 MG PO TABS: 40.0000 mg | ORAL_TABLET | Freq: Every day | ORAL | 0 refills | Status: DC

## 2018-09-20 MED ORDER — FUROSEMIDE 20 MG PO TABS: 20.0000 mg | ORAL_TABLET | Freq: Every day | ORAL | Status: DC

## 2018-09-20 MED ORDER — LOSARTAN POTASSIUM 25 MG PO TABS: 25.0000 mg | ORAL_TABLET | Freq: Every day | ORAL | 0 refills | Status: DC

## 2018-09-20 MED ORDER — REGADENOSON 0.4 MG/5ML IV SOLN
INTRAVENOUS | Status: AC
  Filled 2018-09-20: qty 5

## 2018-09-20 MED ORDER — FUROSEMIDE 40 MG PO TABS: 40.0000 mg | ORAL_TABLET | Freq: Every day | ORAL | Status: DC

## 2018-09-20 MED ORDER — REGADENOSON 0.4 MG/5ML IV SOLN
0.4000 mg | Freq: Once | INTRAVENOUS | Status: AC
  Administered 2018-09-20: 0.4 mg via INTRAVENOUS
  Filled 2018-09-20: qty 5

## 2018-09-20 MED ORDER — WARFARIN SODIUM 5 MG PO TABS: 5.0000 mg | ORAL_TABLET | Freq: Once | ORAL | Status: DC

## 2018-09-20 MED ORDER — DIPHENHYDRAMINE HCL 50 MG/ML IJ SOLN
25.0000 mg | Freq: Once | INTRAMUSCULAR | Status: AC
  Administered 2018-09-20: 25 mg via INTRAVENOUS

## 2018-09-20 MED ORDER — TECHNETIUM TC 99M TETROFOSMIN IV KIT
10.0000 | PACK | Freq: Once | INTRAVENOUS | Status: AC | PRN
  Administered 2018-09-20: 10 via INTRAVENOUS

## 2018-09-20 MED ORDER — TECHNETIUM TC 99M TETROFOSMIN IV KIT
30.0000 | PACK | Freq: Once | INTRAVENOUS | Status: AC | PRN
  Administered 2018-09-20: 30 via INTRAVENOUS

## 2018-09-20 MED ORDER — DIPHENHYDRAMINE HCL 50 MG/ML IJ SOLN
INTRAMUSCULAR | Status: AC
  Filled 2018-09-20: qty 1

## 2018-09-20 NOTE — Progress Notes (Signed)
PAtient informed Probation officer she is feeling better and face no throat feel tingling anymore. PA at bedside. Patient stable and able to go to NM to complete testing.

## 2018-09-20 NOTE — Progress Notes (Signed)
   [name] presented for a nuclear stress test today.  She reported some perioral tingling and throat tightness shortly after lexiscan administration for which she was given IV benadryl with resolution of symptoms. Will update allergies to reflect this reaction. Stress imaging is pending at this time.  Preliminary EKG findings may be listed in the chart, but the stress test result will not be finalized until perfusion imaging is complete.  1 day study, [facility] to read.  Abigail Butts, PA-C 09/20/2018, 11:06 AM

## 2018-09-20 NOTE — Progress Notes (Signed)
Progress Note  Patient Name: [name] Date of Encounter: 09/20/2018  Primary Cardiologist: Skeet Latch, MD new  Subjective   Feeling well.  Hungry.  Otherwise no complaints   Inpatient Medications    Scheduled Meds:  atorvastatin  40 mg Oral q1800   carvedilol  6.25 mg Oral BID   Chlorhexidine Gluconate Cloth  6 each Topical Q0600   furosemide  40 mg Intravenous Daily   insulin aspart  0-15 Units Subcutaneous Q4H   lacosamide  100 mg Oral BID   levETIRAcetam  1,500 mg Oral BID   pantoprazole  40 mg Oral Daily   [START ON 09/21/2018] phenytoin  30 mg Oral QHS   [START ON 09/23/2018] phenytoin  30 mg Oral q morning - 10a   phenytoin  60 mg Oral QHS   phenytoin  60 mg Oral q morning - 10a   sodium chloride flush  10-40 mL Intracatheter Q12H   warfarin  5 mg Oral ONCE-1800   Warfarin - Pharmacist Dosing Inpatient   Does not apply q1800   Continuous Infusions:  PRN Meds: acetaminophen, fentaNYL (SUBLIMAZE) injection, fentaNYL (SUBLIMAZE) injection, nitroGLYCERIN, ondansetron (ZOFRAN) IV, sodium chloride flush   Vital Signs    Vitals:   09/19/18 1941 09/19/18 2323 09/20/18 0313 09/20/18 0801  BP: 127/62 127/72 125/69 121/64  Pulse: (!) 102 92 92 91  Resp: 16 16 16 16   Temp: 98.8 F (37.1 C) 98.9 F (37.2 C) 98.7 F (37.1 C) 98.4 F (36.9 C)  TempSrc: Oral Oral Oral Oral  SpO2: 99% 98% 98% 97%  Weight:      Height:        Intake/Output Summary (Last 24 hours) at 09/20/2018 0908 Last data filed at 09/19/2018 1800 Gross per 24 hour  Intake 600 ml  Output --  Net 600 ml   Last 3 Weights 09/19/2018 09/18/2018 09/17/2018  Weight (lbs) 145 lb 15.1 oz 145 lb 11.6 oz 135 lb 5.8 oz  Weight (kg) 66.2 kg 66.1 kg 61.4 kg      Telemetry    Sinus rhythm. - Personally Reviewed  ECG    n/a - Personally Reviewed  Physical Exam   GEN: No acute distress.   Neck: No JVD Cardiac: RRR, II/VI diastolic murmur at the LUSB, rubs, or gallops.   Respiratory: Clear to auscultation bilaterally. GI: Soft, nontender, non-distended  MS: No edema; No deformity. Neuro:  R hemiplegia.  Slurred speech. Psych: Normal affect   Labs    High Sensitivity Troponin:   Recent Labs  Lab 09/13/18 0129 09/13/18 0355 09/13/18 0520 09/18/18 1833 09/18/18 2048  TROPONINIHS 1,074* 1,322* 1,905* 86* 93*      Chemistry Recent Labs  Lab 09/18/18 0724 09/19/18 0405 09/20/18 0606  NA 139 137 138  K 3.7 4.3 4.0  CL 105 102 103  CO2 22 22 24   GLUCOSE 95 98 92  BUN 13 16 22   CREATININE 0.84 1.03* 1.00  CALCIUM 9.0 8.7* 8.6*  GFRNONAA >60 55* 57*  GFRAA >60 >60 >60  ANIONGAP 12 13 11      Hematology Recent Labs  Lab 09/15/18 0521 09/17/18 0509 09/19/18 0405  WBC 10.8* 9.6 8.8  RBC 4.86 5.01 4.64  HGB 13.6 13.9 13.0  HCT 41.1 42.3 39.9  MCV 84.6 84.4 86.0  MCH 28.0 27.7 28.0  MCHC 33.1 32.9 32.6  RDW 13.8 13.9 14.0  PLT 282 333 275    BNPNo results for input(s): BNP, PROBNP in the last 168 hours.   DDimer  No results for input(s): DDIMER in the last 168 hours.   Radiology    Dg Chest Port 1 View  Result Date: 09/18/2018 CLINICAL DATA:  Shortness of breath, history of hypertension, CHF, stroke and seizures. EXAM: PORTABLE CHEST 1 VIEW COMPARISON:  Radiograph September 14, 2018 FINDINGS: Lung volumes are diminished. There are hazy interstitial opacities throughout the lungs with fissural and septal thickening as well as some central vascular cuffing. The pulmonary vascularity is cephalized and indistinct. No consolidative process. No pneumothorax or effusion. Cardiomediastinal contours are unchanged from prior including a tortuous, calcified aorta. No acute osseous or soft tissue abnormality. Vascular calcifications are noted in both axilla. Degenerative changes are present in the imaged spine and shoulders. IMPRESSION: Findings of CHF with interstitial edema. Aortic Atherosclerosis (ICD10-I70.0). Electronically Signed   By: Lovena Le M.D.   On: 09/18/2018 18:55    Cardiac Studies   Lexiscan Myoview pending.  Patient Profile     [name] is a 68 y.o. female with a hx of seizure disorder, stroke and R hemiplegia who is being seen today for the evaluation of acute systolic heart failure and elevated troponin.  Assessment & Plan    # Elevated troponin: # Acute systolic heart failure: [name] has an elevated troponin in the setting of a seizure.  However she had chest pain overnight on 8/27.  She currently denies chest pain and doesn't recall having chest pain before.  Given her aphasia it is unclear whether she ever really had chest pain. I suspect that her troponin elevated is more related to demand ischemia from her seizure than ACS.  However, echo also shows reduced systolic function.  Lexiscan Myoview was performed 8/29 and results are pending.  Continue carvedilol.  We will add losartan 25mg  daily.  She is on digoxin for unclear reasons.   # CVA: She has residual hemiplegia.  She is on warfarin.  INR 2.6.    # Hypertension:  Continue carvedilol.  # Hyperlipidemia: Continue atorvastatin.        For questions or updates, please contact [place] Please consult [email or web address] for contact info under        Signed, Skeet Latch, MD  09/20/2018, 9:08 AM

## 2018-09-20 NOTE — Progress Notes (Signed)
Patient reports of tingling to mouth and throat feeling funny post Lexiscan. Patient is difficult to understand. No swelling noted to mouth or airway. Pa assessed patient, states patient is stable and able to complete NM testing. Patient stable and in no sign of distress. Cheryl informed in [place].

## 2018-09-20 NOTE — TOC Transition Note (Signed)
Transition of Care [facility]) - CM/SW Discharge Note   Patient Details  Name: [name] MRN: [id number] Date of Birth: [birth year]  Transition of Care [facility] [place]) CM/SW Contact:  Gelene Mink, [place] Phone Number: 09/20/2018, 2:35 PM   Clinical Narrative:     Patient will DC to: [place] Anticipated DC date: 09/20/2018 Family notified: Yes Transport by: Corey Harold   Per MD patient ready for DC to . RN, patient, patient's family, and facility notified of DC. Discharge Summary and FL2 sent to facility. RN to call report prior to discharge [phone], ask for nurse in [facility]). The patient will go to room 604P.  DC packet on chart. Ambulance transport requested for patient.   CSW will sign off for now as social work intervention is no longer needed. Please consult [place] again if new needs arise.  Rashidah [place], LCSW-A /Clinical Social Work Department Cell: [phone]    Final next level of care: [place] Barriers to Discharge: No Barriers Identified   Patient Goals and CMS Choice Patient states their goals for this hospitalization and ongoing recovery are:: Pt will go to [facility] for rehab CMS Medicare.gov Compare Post Acute Care list provided to:: Patient Represenative (must comment) Choice offered to / list presented to : Spouse  Discharge Placement   Existing PASRR number confirmed : 09/17/18          Patient chooses bed at: [facility] Patient to be transferred to facility by: [place] Name of family member notified: [name] Patient and family notified of of transfer: 09/20/18  Discharge Plan and Services In-house Referral: Clinical Social Work              DME Arranged: N/A DME Agency: NA       HH Arranged: NA HH Agency: NA        Social Determinants of Health (SDOH) Interventions     Readmission Risk Interventions No flowsheet data found.

## 2018-09-20 NOTE — Progress Notes (Signed)
[place] for Warfarin Indication: history of stroke  No Known Allergies  Patient Measurements: Height: 5\' 2"  (157.5 cm) Weight: 145 lb 15.1 oz (66.2 kg) IBW/kg (Calculated) : 50.1  Vital Signs: Temp: 98.4 F (36.9 C) (08/29 0801) Temp Source: Oral (08/29 0801) BP: 121/64 (08/29 0801) Pulse Rate: 91 (08/29 0801)  Labs: Recent Labs    09/18/18 0433 09/18/18 0724 09/18/18 1833 09/18/18 2048 09/19/18 0405 09/20/18 0606  HGB  --   --   --   --  13.0  --   HCT  --   --   --   --  39.9  --   PLT  --   --   --   --  275  --   LABPROT 27.9*  --   --   --  27.4* 28.3*  INR 2.7*  --   --   --  2.6* 2.7*  CREATININE  --  0.84  --   --  1.03* 1.00  TROPONINIHS  --   --  86* 93*  --   --     Estimated Creatinine Clearance: 47.4 mL/min (by C-G formula based on SCr of 1 mg/dL).   Medical History: Past Medical History:  Diagnosis Date  . Adenomatous polyp of colon 09/2012   repeat colonoscopy in 5 years by Dr. Sharlett Iles  . CHF (congestive heart failure) (HCC)    EF 15-20% as of 05/28/11 (Dr [facility]grand [facility] summary)  . CVA (cerebral infarction)   . Hemiparesis ([place])   . Hyperlipidemia   . Hypertension   . Seizures ([place])   . Sickle cell anemia (HCC)   . Stroke ([place])   . Vitamin D deficiency 2010    Assessment: 68 year old female admitted 8/21 after witnessed seizure at home. Patient had run out of Keppra medication for 4 days. Last dose of Warfarin was on 8/19 PTA per husband. CT Head was negative for acute abnormality. CCM confirmed ok for warfarin restart and patient has now passed swallow evaluation.  PTA dosing: 5mg  daily  INR therapeutic at 2.7 today, stable. CBC wnl and stable. No active bleed issues documented. Planning to wean phenytoin off.  Goal of Therapy:  INR 2-3 Monitor platelets by anticoagulation protocol: Yes   Plan:  Warfarin 5mg  PO x1 tonight Monitor daily INR, CBC, s/sx bleeding D/c orders in for  SNF, unsure when patient will transition   Nicoletta Dress, PharmD PGY2 Infectious Disease Pharmacy Resident  Please see AMION for all Pharmacists' Contact Phone Numbers 09/20/2018, 8:32 AM

## 2018-09-20 NOTE — Plan of Care (Signed)
Plan of care adequate for discharge.

## 2018-09-20 NOTE — Progress Notes (Signed)
Patient being discharged to [facility].  Report called to [facility].  Patient to be transported by [facility].  IV removed with the catheter intact.  Discharge information and prescription information placed in the packet for transport.

## 2018-09-20 NOTE — Progress Notes (Signed)
Progress Note  Patient Name: [name] Date of Encounter: 09/20/2018  Primary Cardiologist: Skeet Latch, MD   Subjective   Difficult to understand patient due to dysarthria following stroke. No complaints of chest pain or SOB prior to stress test.   Inpatient Medications    Scheduled Meds: . atorvastatin  40 mg Oral q1800  . carvedilol  6.25 mg Oral BID  . Chlorhexidine Gluconate Cloth  6 each Topical Q0600  . diphenhydrAMINE      . furosemide  40 mg Intravenous Daily  . insulin aspart  0-15 Units Subcutaneous Q4H  . lacosamide  100 mg Oral BID  . levETIRAcetam  1,500 mg Oral BID  . pantoprazole  40 mg Oral Daily  . [START ON 09/21/2018] phenytoin  30 mg Oral QHS  . [START ON 09/23/2018] phenytoin  30 mg Oral q morning - 10a  . phenytoin  60 mg Oral QHS  . phenytoin  60 mg Oral q morning - 10a  . regadenoson      . sodium chloride flush  10-40 mL Intracatheter Q12H  . warfarin  5 mg Oral ONCE-1800  . Warfarin - Pharmacist Dosing Inpatient   Does not apply q1800   Continuous Infusions:  PRN Meds: acetaminophen, fentaNYL (SUBLIMAZE) injection, fentaNYL (SUBLIMAZE) injection, nitroGLYCERIN, ondansetron (ZOFRAN) IV, sodium chloride flush   Vital Signs    Vitals:   09/20/18 1003 09/20/18 1036 09/20/18 1038 09/20/18 1039  BP: 135/83 (!) 152/86 115/65 123/62  Pulse:      Resp:      Temp:      TempSrc:      SpO2:      Weight:      Height:        Intake/Output Summary (Last 24 hours) at 09/20/2018 1110 Last data filed at 09/19/2018 1800 Gross per 24 hour  Intake 360 ml  Output -  Net 360 ml   Filed Weights   09/17/18 0500 09/18/18 0510 09/19/18 0157  Weight: 61.4 kg 66.1 kg 66.2 kg    Telemetry    Unable to review - seen in [facility] Med  ECG    Sinus rhythm with chronic RBBB and nearly diffuse T wave inversions, no STE/D - Personally Reviewed  Physical Exam   GEN: No acute distress.   Neck: No JVD, no carotid bruits Cardiac: RRR, +murmur and  gallop, no rubs.  Respiratory: Clear to auscultation bilaterally, no wheezes/ rales/ rhonchi GI: NABS, Soft, nontender, non-distended  MS: No edema; No deformity. Neuro:  Right sided hemiparesis; dysarthria  Psych: Normal affect   Labs    Chemistry Recent Labs  Lab 09/18/18 0724 09/19/18 0405 09/20/18 0606  NA 139 137 138  K 3.7 4.3 4.0  CL 105 102 103  CO2 22 22 24   GLUCOSE 95 98 92  BUN 13 16 22   CREATININE 0.84 1.03* 1.00  CALCIUM 9.0 8.7* 8.6*  GFRNONAA >60 55* 57*  GFRAA >60 >60 >60  ANIONGAP 12 13 11      Hematology Recent Labs  Lab 09/15/18 0521 09/17/18 0509 09/19/18 0405  WBC 10.8* 9.6 8.8  RBC 4.86 5.01 4.64  HGB 13.6 13.9 13.0  HCT 41.1 42.3 39.9  MCV 84.6 84.4 86.0  MCH 28.0 27.7 28.0  MCHC 33.1 32.9 32.6  RDW 13.8 13.9 14.0  PLT 282 333 275    Cardiac EnzymesNo results for input(s): TROPONINI in the last 168 hours. No results for input(s): TROPIPOC in the last 168 hours.   BNPNo results for  input(s): BNP, PROBNP in the last 168 hours.   DDimer No results for input(s): DDIMER in the last 168 hours.   Radiology    Dg Chest Port 1 View  Result Date: 09/18/2018 CLINICAL DATA:  Shortness of breath, history of hypertension, CHF, stroke and seizures. EXAM: PORTABLE CHEST 1 VIEW COMPARISON:  Radiograph September 14, 2018 FINDINGS: Lung volumes are diminished. There are hazy interstitial opacities throughout the lungs with fissural and septal thickening as well as some central vascular cuffing. The pulmonary vascularity is cephalized and indistinct. No consolidative process. No pneumothorax or effusion. Cardiomediastinal contours are unchanged from prior including a tortuous, calcified aorta. No acute osseous or soft tissue abnormality. Vascular calcifications are noted in both axilla. Degenerative changes are present in the imaged spine and shoulders. IMPRESSION: Findings of CHF with interstitial edema. Aortic Atherosclerosis (ICD10-I70.0). Electronically  Signed   By: Lovena Le M.D.   On: 09/18/2018 18:55    Cardiac Studies   Echo 09/19/18: IMPRESSIONS   1. The left ventricle has moderately reduced systolic function, with an ejection fraction of 35-40%. The cavity size was normal. There is moderately increased left ventricular wall thickness. Left ventricular diastolic Doppler parameters are consistent  with impaired relaxation. Elevated left atrial and left ventricular end-diastolic pressures The E/e' is >20. Left ventricular diffuse hypokinesis. 2. The right ventricle has normal systolic function. The cavity was normal. There is no increase in right ventricular wall thickness. 3. Left atrial size was mildly dilated. 4. The mitral valve is grossly normal. 5. The tricuspid valve is grossly normal. 6. The aortic valve is tricuspid. Moderate sclerosis of the aortic valve. Aortic valve regurgitation is moderate by color flow Doppler. No stenosis of the aortic valve. 7. The aorta is normal unless otherwise noted.  Patient Profile     68 y.o. female with PMH of seizure disorder, stroke with residual right sided hemiplegia and dysarthria, who reported chest pain this admission, found to have acute systolic CHF for which cardiology is following  Assessment & Plan    1. Acute combined CHF: patient with an episode of chest pain this admission. Echo showed EF 35-40%, G1DD, and moderate AI. Trop was elevated to 93, felt to be demand following recent seizure, though cannot exclude ischemia with echo findings. She was seen in [facility] this morning for stress test. Started on IV lasix 40mg  daily 09/19/2018. I&Os inaccurate with numerous unmeasured UOP occurrences. Weight is up 10lbs over the past 3 days from 135>145>145lbs today.  - Await NST results - Continue carvedilol  - Continue lasix - Favor starting entresto prior to discharge  2. HTN: BP overall stable  - Continue carvedilol and lasix - Consider starting entresto  3. HLD: LDL 71  06/2018 - Continue atrovastatin  For questions or updates, please contact [place] Please consult [email or web address] for contact info under Cardiology/STEMI.      Signed, Abigail Butts, PA-C  09/20/2018, 11:10 AM   [phone]

## 2018-09-20 NOTE — Discharge Summary (Addendum)
Physician Discharge Summary  [name] [id number] DOB: [birth year] DOA: 09/12/2018  PCP: Rutherford Guys, MD  Admit date: 09/12/2018 Discharge date: 09/20/2018  Admitted From: Home  Disposition:  SNF  Recommendations for Outpatient Follow-up:  1. Follow up with PCP in 1-2 weeks 2. Please obtain BMP/CBC in one week 3. Needs follow up with neurology for further titration of medications. [name].  4. Needs to follow up with cardiology for further care of Heart failure. Patient follows up with [name].  5. Monitor renal function, patient was started on cozaar.    Discharge Condition: stable.  CODE STATUS: full code Diet recommendation: Heart Healthy  Brief/Interim Summary: 68 year old female with a history of seizure and CVA with right-sided hemi-plegia with witnessed seizure earlier today. Patient was urgently intubated due to inability to protect airway. Husband relates that she ran out of all her medications which she obtains by mail approximately 4 days ago when he been unable to obtain refills. She had required assistance to the bathroom this afternoon which is unusual for her and he had left her sitting on the bed only to find her at the side of the bed tremulous apparently seizing. On my evaluation in the emergency room the patient has no overt seizure activity. Neurology has evaluated her here. Patient intubated for airway protection.    Chest pain , Dyspnea;  Patient complaining of chest pain the night of 8/27.  EKG with T wave inversion anterior lead.  Patient started on  nitro, aspirin , lipitor.  Troponin mildly elevated.  Cardiology consulted. stress test, high risk due to low EF, old scar, no significant evidence of reversible ischemia.   Acute systolic HF exacerbation;  Chest x ray with pulmonary edema.  Started IV lasix daily. plan to transition to oral lasix.  Started on low dose cozaar.   Acute respiratory failure; intubated for  airway protection due to status epileptico. -Extubated 8/22. -pulmonary hygiene. -Dysphagia3diet per speech; will monitor carefully for signs of aspiration -Respiratory status stable.   History of seizure activity in the setting of prior CVA. Unfortunately she had run out of her Keppra at home. She had perceivable seizure activity on EEG until 2 AM -Continue Keppra, Vimpat, Dilantin. Transition to oral.  -Dilantin taper, see ordered.dilantin dose by date.  Acute renal failure, resolved -Continue to monitor urine output and electrolytes - Maintain renal perfusion  Hypokalemia, hypophosphatemia -Resolved  Hypertension, probable diastolic dysfunction - BP is up trending. -increasecoreg back to home dose  History of CVA, on chronic Coumadinfor anticoagulation. Unclear if history of Afib. Discharge summary in 2013 indicates that warfarin was initiated for ventricular hypokinesis associated with HFrEF. -Appreciate pharmacy's assistance. INR therapeutic today. -May need to revisit if this medication is still required. Neurology input appreciated -monitor INR daily.  Nutrition - Dysphagia 1 diet - Aspiration precautions  Discharge Diagnoses:  Active Problems:   Seizure disorder ([name])   Status epilepticus ([name])   Acute combined systolic and diastolic heart failure ([name])   Demand ischemia [name])    Discharge Instructions  Discharge Instructions    Diet - low sodium heart healthy   Complete by: As directed    Increase activity slowly   Complete by: As directed    Increase activity slowly   Complete by: As directed      Allergies as of 09/20/2018      Reactions   Lexiscan [regadenoson] Other (See Comments)   Perioral tingling and throat tightness      Medication List  STOP taking these medications   docusate sodium 50 MG capsule Commonly known as: COLACE   hydrocortisone 25 MG suppository Commonly known as: Network engineer      TAKE these medications   alendronate 70 MG tablet Commonly known as: FOSAMAX TAKE 1 TABLET EVERY 7 DAYS WITH FULL GLASS OF WATER ON AN EMPTY STOMACH   atorvastatin 80 MG tablet Commonly known as: LIPITOR Take 1 tablet (80 mg total) by mouth at bedtime.   baclofen 20 MG tablet Commonly known as: LIORESAL TAKE 1 TABLET THREE TIMES DAILY (MAY TAKE AN ADDITIONAL TABLET MID-DAY OR BEFORE BED AS NEEDED FOR MUSCLE SPASMS)   carvedilol 6.25 MG tablet Commonly known as: COREG Take 1 tablet (6.25 mg total) by mouth 2 (two) times daily.   digoxin 0.125 MG tablet Commonly known as: LANOXIN TAKE 1 TABLET (125 MCG TOTAL) BY MOUTH DAILY.   furosemide 40 MG tablet Commonly known as: LASIX Take 1 tablet (40 mg total) by mouth daily. Start taking on: September 21, 2018   Lacosamide 100 MG Tabs Take 1 tablet (100 mg total) by mouth 2 (two) times daily.   levETIRAcetam 750 MG tablet Commonly known as: KEPPRA Take 2 tablets (1,500 mg total) by mouth 2 (two) times daily. What changed:   medication strength  how much to take   losartan 25 MG tablet Commonly known as: COZAAR Take 1 tablet (25 mg total) by mouth daily.   nystatin powder Generic drug: nystatin Apply topically daily. Apply under breasts once daily after bath   phenytoin 125 MG/5ML suspension Commonly known as: DILANTIN Phenytoin Morning Evening 8/26-8/27 120mg   60mg  8/28-8/29 60mg              60mg  8/30-8/31 60mg              30mg  9/1-9/2 30mg  30mg  9/3-9/4 30mg  STOP 9/5 STOP STOP   polyethylene glycol 17 g packet Commonly known as: MIRALAX / GLYCOLAX Take 17 g by mouth daily as needed for moderate constipation.   triamcinolone cream 0.1 % Commonly known as: KENALOG Apply 1 application topically 2 (two) times daily.   Trixaicin 0.025 % cream Generic drug: capsicum oleoresin Apply 1 application topically daily as needed (arthiritis pain).   Vitamin D (Ergocalciferol) 1.25 MG (50000 UT) Caps capsule Commonly  known as: DRISDOL TAKE 1 CAPSULE EVERY 7 DAYS   warfarin 5 MG tablet Commonly known as: COUMADIN TAKE AS DIRECTED BY COUMADIN CLINIC. NEED OFFICE VISIT WITH FASTING LABS FOR REFILLS What changed: See the new instructions.       Contact information for follow-up providers    Rutherford Guys, MD Follow up in 1 week(s).   Specialty: Family Medicine Contact information: [address] [place] [address] [id number]        Lora Havens, MD Follow up in 1 week(s).   Specialty: Neurology Contact information: [place] [address] [phone]            Contact information for after-discharge care    Destination    [facility] .   Service: Skilled Nursing Contact information: [place] [address] [phone]                 Allergies  Allergen Reactions  . Lexiscan [Regadenoson] Other (See Comments)    Perioral tingling and throat tightness    Consultations:  Cardiology  Neurology    Procedures/Studies: Ct Head Wo Contrast  Result Date: 09/12/2018 CLINICAL  DATA:  C-spine trauma, high clinical risk, found down EXAM: CT HEAD WITHOUT CONTRAST CT CERVICAL SPINE WITHOUT CONTRAST TECHNIQUE: Multidetector CT imaging of the head and cervical spine was performed following the standard protocol without intravenous contrast. Multiplanar CT image reconstructions of the cervical spine were also generated. COMPARISON:  MR May 28, 2011 FINDINGS: CT HEAD FINDINGS Brain: Chronic encephalomalacia within the left MCA distribution similar to comparison from from 2013. No evidence of acute infarction, hemorrhage, hydrocephalus, extra-axial collection or mass lesion/mass effect. Symmetric prominence of the ventricles, cisterns and sulci compatible with parenchymal volume loss. Patchy areas of white matter hypoattenuation are most compatible with chronic microvascular angiopathy. Vascular: Atherosclerotic  calcification of the carotid siphons. Skull: No calvarial fracture or suspicious osseous lesion. No scalp swelling or hematoma. Sinuses/Orbits: Paranasal sinuses and mastoid air cells are predominantly clear. Orbital structures are unremarkable. Other: None CT CERVICAL SPINE FINDINGS Alignment: Straightening of the normal cervical lordosis without traumatic listhesis. No abnormal facet widening. Normal alignment of the craniocervical and atlantoaxial articulations. Skull base and vertebrae: No acute fracture. No primary bone lesion or focal pathologic process. Soft tissues and spinal canal: No pre or paravertebral fluid or swelling. No visible canal hematoma. Disc levels: Multilevel cervical spondylitic changes are present with calcified disc osteophyte complexes at C3-4, C4-5, C5-6 these findings result in at most mild spinal canal stenosis. Uncinate spurring results and mild right neural foraminal narrowing at C5-6. No other significant canal or foraminal stenosis within the included portions of the cervical spine. Upper chest: Some interlobular septal thickening on a background of emphysematous change. Other: Endotracheal and transesophageal tubes are noted within their respective lumens. Small amount of airways secretions noted above the inflated endotracheal balloon. Atherosclerotic plaque within the carotid bifurcations. Small amount of gas in the subclavian veins, likely iatrogenic related to intravenous access. IMPRESSION: 1. No acute intracranial abnormality. Remote left MCA distribution infarct. 2. No acute cervical spine fracture. Multilevel degenerative changes of the cervical spine as described above. 3. Endotracheal and transesophageal tubes are noted within their respective lumens. Small amount of airways secretions noted above the inflated endotracheal balloon. Electronically Signed   By: Lovena Le M.D.   On: 09/12/2018 21:14   Ct Cervical Spine Wo Contrast  Result Date: 09/12/2018 CLINICAL  DATA:  C-spine trauma, high clinical risk, found down EXAM: CT HEAD WITHOUT CONTRAST CT CERVICAL SPINE WITHOUT CONTRAST TECHNIQUE: Multidetector CT imaging of the head and cervical spine was performed following the standard protocol without intravenous contrast. Multiplanar CT image reconstructions of the cervical spine were also generated. COMPARISON:  MR May 28, 2011 FINDINGS: CT HEAD FINDINGS Brain: Chronic encephalomalacia within the left MCA distribution similar to comparison from from 2013. No evidence of acute infarction, hemorrhage, hydrocephalus, extra-axial collection or mass lesion/mass effect. Symmetric prominence of the ventricles, cisterns and sulci compatible with parenchymal volume loss. Patchy areas of white matter hypoattenuation are most compatible with chronic microvascular angiopathy. Vascular: Atherosclerotic calcification of the carotid siphons. Skull: No calvarial fracture or suspicious osseous lesion. No scalp swelling or hematoma. Sinuses/Orbits: Paranasal sinuses and mastoid air cells are predominantly clear. Orbital structures are unremarkable. Other: None CT CERVICAL SPINE FINDINGS Alignment: Straightening of the normal cervical lordosis without traumatic listhesis. No abnormal facet widening. Normal alignment of the craniocervical and atlantoaxial articulations. Skull base and vertebrae: No acute fracture. No primary bone lesion or focal pathologic process. Soft tissues and spinal canal: No pre or paravertebral fluid or swelling. No visible canal hematoma. Disc levels: Multilevel cervical spondylitic changes  are present with calcified disc osteophyte complexes at C3-4, C4-5, C5-6 these findings result in at most mild spinal canal stenosis. Uncinate spurring results and mild right neural foraminal narrowing at C5-6. No other significant canal or foraminal stenosis within the included portions of the cervical spine. Upper chest: Some interlobular septal thickening on a background of  emphysematous change. Other: Endotracheal and transesophageal tubes are noted within their respective lumens. Small amount of airways secretions noted above the inflated endotracheal balloon. Atherosclerotic plaque within the carotid bifurcations. Small amount of gas in the subclavian veins, likely iatrogenic related to intravenous access. IMPRESSION: 1. No acute intracranial abnormality. Remote left MCA distribution infarct. 2. No acute cervical spine fracture. Multilevel degenerative changes of the cervical spine as described above. 3. Endotracheal and transesophageal tubes are noted within their respective lumens. Small amount of airways secretions noted above the inflated endotracheal balloon. Electronically Signed   By: Lovena Le M.D.   On: 09/12/2018 21:14   Nm Myocar Multi W/spect W/wall Motion / Ef  Result Date: 09/20/2018  Downsloping ST segment depression ST segment depression of 0.5 mm was noted during stress in the II, V4, V5 and V6 leads, and returning to baseline after 1-5 minutes of recovery.  Defect 1: There is a large defect of severe severity present in the basal inferior, basal inferolateral, mid inferior, mid inferolateral, apical septal, apical inferior, apical lateral and apex location.  Findings consistent with prior myocardial infarction with peri-infarct ischemia.  This is a high risk study.  Nuclear stress EF: 34%.  Large size, severe severity mostly fixed (SDS 3) inferior, inferoapical and inferolateral perfusion defect suggestive of scar without significant peri-infarct ischemia. LVEF 34% with inferior, inferoapical and inferolateral akinesis. This is a high risk study due to low LVEF, however, no significant reversible ischemia is noted.   Dg Chest Port 1 View  Result Date: 09/18/2018 CLINICAL DATA:  Shortness of breath, history of hypertension, CHF, stroke and seizures. EXAM: PORTABLE CHEST 1 VIEW COMPARISON:  Radiograph September 14, 2018 FINDINGS: Lung volumes are  diminished. There are hazy interstitial opacities throughout the lungs with fissural and septal thickening as well as some central vascular cuffing. The pulmonary vascularity is cephalized and indistinct. No consolidative process. No pneumothorax or effusion. Cardiomediastinal contours are unchanged from prior including a tortuous, calcified aorta. No acute osseous or soft tissue abnormality. Vascular calcifications are noted in both axilla. Degenerative changes are present in the imaged spine and shoulders. IMPRESSION: Findings of CHF with interstitial edema. Aortic Atherosclerosis (ICD10-I70.0). Electronically Signed   By: Lovena Le M.D.   On: 09/18/2018 18:55   Dg Chest Port 1 View  Result Date: 09/14/2018 CLINICAL DATA:  Acute respiratory failure. EXAM: PORTABLE CHEST 1 VIEW COMPARISON:  Chest radiograph 09/13/2018 FINDINGS: Stable enlarged cardiac and mediastinal contours. Elevation right hemidiaphragm. Limited exam due to overlapping hardware. Minimal bibasilar atelectasis. No large area pulmonary consolidation. No pleural effusion or pneumothorax. IMPRESSION: Improving bibasilar atelectasis. Exam limited due to overlapping hardware. Electronically Signed   By: Lovey Newcomer M.D.   On: 09/14/2018 09:18   Dg Chest Port 1 View  Result Date: 09/13/2018 CLINICAL DATA:  Respiratory failure EXAM: PORTABLE CHEST 1 VIEW COMPARISON:  Chest radiograph 09/12/2018 FINDINGS: ETT terminates distal trachea. Enteric tube courses inferior to the diaphragm. Monitoring leads overlie the patient. Stable cardiac and mediastinal contours. Improving bilateral lower lung opacities. No definite pleural effusion or pneumothorax. IMPRESSION: Improving bilateral lower lung opacities which may represent improving atelectasis or infection. Electronically Signed  By: Lovey Newcomer M.D.   On: 09/13/2018 09:30   Dg Chest Portable 1 View  Result Date: 09/12/2018 CLINICAL DATA:  Ventilator dependence. EXAM: PORTABLE CHEST 1 VIEW  COMPARISON:  Earlier same day FINDINGS: Endotracheal tube tip is 1.4 cm above the base of the carina. The NG tube passes into the stomach although the distal tip position is not included on the film. The cardio pericardial silhouette is enlarged. Bibasilar atelectasis or infiltrate noted. The visualized bony structures of the thorax are intact. Insert wire IMPRESSION: Cardiomegaly with bibasilar atelectasis or infiltrate. Electronically Signed   By: Misty Stanley M.D.   On: 09/12/2018 21:23   (Echo, Carotid, EGD, Colonoscopy, ERCP)    Subjective:   Discharge Exam: Vitals:   09/20/18 1039 09/20/18 1131  BP: 123/62 (!) 142/73  Pulse:  99  Resp:  18  Temp:  97.6 F (36.4 C)  SpO2:  99%     General: Pt is alert, awake, not in acute distress Cardiovascular: RRR, S1/S2 +, no rubs, no gallops Respiratory: CTA bilaterally, no wheezing, no rhonchi Abdominal: Soft, NT, ND, bowel sounds + Extremities: no edema, no cyanosis    The results of significant diagnostics from this hospitalization (including imaging, microbiology, ancillary and laboratory) are listed below for reference.     Microbiology: Recent Results (from the past 240 hour(s))  SARS Coronavirus 2 [facility] order, Performed in [facility] hospital lab) Nasopharyngeal Nasopharyngeal Swab     Status: None   Collection Time: 09/12/18  8:44 PM   Specimen: Nasopharyngeal Swab  Result Value Ref Range Status   SARS Coronavirus 2 NEGATIVE NEGATIVE Final    Comment: (NOTE) If result is NEGATIVE SARS-CoV-2 target nucleic acids are NOT DETECTED. The SARS-CoV-2 RNA is generally detectable in upper and lower  respiratory specimens during the acute phase of infection. The lowest  concentration of SARS-CoV-2 viral copies this assay can detect is 250  copies / mL. A negative result does not preclude SARS-CoV-2 infection  and should not be used as the sole basis for treatment or other  patient management decisions.  A negative result  may occur with  improper specimen collection / handling, submission of specimen other  than nasopharyngeal swab, presence of viral mutation(s) within the  areas targeted by this assay, and inadequate number of viral copies  (<250 copies / mL). A negative result must be combined with clinical  observations, patient history, and epidemiological information. If result is POSITIVE SARS-CoV-2 target nucleic acids are DETECTED. The SARS-CoV-2 RNA is generally detectable in upper and lower  respiratory specimens dur ing the acute phase of infection.  Positive  results are indicative of active infection with SARS-CoV-2.  Clinical  correlation with patient history and other diagnostic information is  necessary to determine patient infection status.  Positive results do  not rule out bacterial infection or co-infection with other viruses. If result is PRESUMPTIVE POSTIVE SARS-CoV-2 nucleic acids MAY BE PRESENT.   A presumptive positive result was obtained on the submitted specimen  and confirmed on repeat testing.  While 2019 novel coronavirus  (SARS-CoV-2) nucleic acids may be present in the submitted sample  additional confirmatory testing may be necessary for epidemiological  and / or clinical management purposes  to differentiate between  SARS-CoV-2 and other Sarbecovirus currently known to infect humans.  If clinically indicated additional testing with an alternate test  methodology [phone]) is advised. The SARS-CoV-2 RNA is generally  detectable in upper and lower respiratory sp ecimens during the acute  phase of infection. The expected result is Negative. Fact Sheet for Patients:  StrictlyIdeas.no Fact Sheet for Healthcare Providers: BankingDealers.co.za This test is not yet approved or cleared by the [place] FDA and has been authorized for detection and/or diagnosis of SARS-CoV-2 by FDA under an Emergency Use Authorization (EUA).   This EUA will remain in effect (meaning this test can be used) for the duration of the COVID-19 declaration under Section 564(b)(1) of the Act, 21 U.S.C. section 360bbb-3(b)(1), unless the authorization is terminated or revoked sooner. Performed at [facility], [place] [address], [place], [place] [address]   Blood culture (routine x 2)     Status: None   Collection Time: 09/12/18 11:05 PM   Specimen: BLOOD LEFT HAND  Result Value Ref Range Status   Specimen Description BLOOD LEFT HAND  Final   Special Requests   Final    BOTTLES DRAWN AEROBIC ONLY Blood Culture results may not be optimal due to an inadequate volume of blood received in culture bottles   Culture   Final    NO GROWTH 5 DAYS Performed at [facility], [place] [address] [place], [place] [address]    Report Status 09/17/2018 FINAL  Final  MRSA PCR Screening     Status: None   Collection Time: 09/12/18 11:57 PM   Specimen: Nasal Mucosa; Nasopharyngeal  Result Value Ref Range Status   MRSA by PCR NEGATIVE NEGATIVE Final    Comment:        The GeneXpert MRSA Assay (FDA approved for NASAL specimens only), is one component of a comprehensive MRSA colonization surveillance program. It is not intended to diagnose MRSA infection nor to guide or monitor treatment for MRSA infections. Performed at [facility], [place] [address] [place], [place] [address]   Culture, blood (Routine X 2) w Reflex to ID Panel     Status: None   Collection Time: 09/13/18  1:27 AM   Specimen: BLOOD RIGHT HAND  Result Value Ref Range Status   Specimen Description BLOOD RIGHT HAND  Final   Special Requests   Final    BOTTLES DRAWN AEROBIC ONLY Blood Culture results may not be optimal due to an inadequate volume of blood received in culture bottles   Culture   Final    NO GROWTH 5 DAYS Performed at [facility], [place] [address], [place],  [address]    Report Status 09/18/2018 FINAL  Final  Novel Coronavirus, NAA  (hospital order; send-out to ref lab)     Status: None   Collection Time: 09/17/18  4:55 PM   Specimen: Nasopharyngeal Swab; Respiratory  Result Value Ref Range Status   SARS-CoV-2, NAA NOT DETECTED NOT DETECTED Final    Comment: (NOTE) This test was developed and its performance characteristics determined by [employer]. This test has not been FDA cleared or approved. This test has been authorized by FDA under an Emergency Use Authorization (EUA). This test is only authorized for the duration of time the declaration that circumstances exist justifying the authorization of the emergency use of in vitro diagnostic tests for detection of SARS-CoV-2 virus and/or diagnosis of COVID-19 infection under section 564(b)(1) of the Act, 21 U.S.C. [id number]), unless the authorization is terminated or revoked sooner. When diagnostic testing is negative, the possibility of a false negative result should be considered in the context of a patient's recent exposures and the presence of clinical signs and symptoms consistent with COVID-19. An individual without symptoms  of COVID-19 and who is not shedding SARS-CoV-2 virus would expect to have a negative (not detected) result in this assay. Performed  At: [facility] [address] [place], [place] [id number] Rush Farmer MD [id number]    [place]  Final    Comment: Performed at [facility], [place] [address] [place], [place] [address]     Labs: BNP (last 3 results) No results for input(s): BNP in the last 8760 hours. Basic Metabolic Panel: Recent Labs  Lab 09/14/18 0732 09/15/18 0521 09/17/18 0509 09/18/18 0724 09/19/18 0405 09/20/18 0606  NA 139 139 138 139 137 138  K 3.4* 3.2* 3.9 3.7 4.3 4.0  CL 103 104 107 105 102 103  CO2 24 24 23 22 22 24   GLUCOSE 86 103* 100* 95 98 92  BUN 8 9 12 13 16 22   CREATININE 0.85 0.82 0.93 0.84 1.03* 1.00  CALCIUM 8.3* 8.4* 8.6* 9.0 8.7* 8.6*  MG  1.8 1.8 1.8  --   --   --   PHOS  --  1.7* 3.2  --   --   --    Liver Function Tests: No results for input(s): AST, ALT, ALKPHOS, BILITOT, PROT, ALBUMIN in the last 168 hours. No results for input(s): LIPASE, AMYLASE in the last 168 hours. No results for input(s): AMMONIA in the last 168 hours. CBC: Recent Labs  Lab 09/14/18 0732 09/15/18 0521 09/17/18 0509 09/19/18 0405  WBC 11.7* 10.8* 9.6 8.8  HGB 15.1* 13.6 13.9 13.0  HCT 45.7 41.1 42.3 39.9  MCV 85.1 84.6 84.4 86.0  PLT 271 282 333 275   Cardiac Enzymes: No results for input(s): CKTOTAL, CKMB, CKMBINDEX, TROPONINI in the last 168 hours. BNP: Invalid input(s): POCBNP CBG: Recent Labs  Lab 09/19/18 0856 09/19/18 1624 09/19/18 1941 09/19/18 2326 09/20/18 0313  GLUCAP 105* 112* 177* 116* 87   D-Dimer No results for input(s): DDIMER in the last 72 hours. Hgb A1c No results for input(s): HGBA1C in the last 72 hours. Lipid Profile No results for input(s): CHOL, HDL, LDLCALC, TRIG, CHOLHDL, LDLDIRECT in the last 72 hours. Thyroid function studies No results for input(s): TSH, T4TOTAL, T3FREE, THYROIDAB in the last 72 hours.  Invalid input(s): FREET3 Anemia work up No results for input(s): VITAMINB12, FOLATE, FERRITIN, TIBC, IRON, RETICCTPCT in the last 72 hours. Urinalysis    Component Value Date/Time   COLORURINE YELLOW 09/12/2018 2130   APPEARANCEUR CLEAR 09/12/2018 2130   LABSPEC 1.014 09/12/2018 2130   PHURINE 6.0 09/12/2018 2130   GLUCOSEU 50 (A) 09/12/2018 2130   HGBUR NEGATIVE 09/12/2018 2130   BILIRUBINUR NEGATIVE 09/12/2018 2130   BILIRUBINUR small 09/30/2014 1000   KETONESUR NEGATIVE 09/12/2018 2130   PROTEINUR >=300 (A) 09/12/2018 2130   UROBILINOGEN 0.2 09/30/2014 1000   UROBILINOGEN 1.0 08/19/2013 1250   NITRITE NEGATIVE 09/12/2018 2130   LEUKOCYTESUR NEGATIVE 09/12/2018 2130   Sepsis Labs Invalid input(s): PROCALCITONIN,  WBC,  LACTICIDVEN Microbiology Recent Results (from the past 240  hour(s))  SARS Coronavirus 2 [facility] order, Performed in [facility] hospital lab) Nasopharyngeal Nasopharyngeal Swab     Status: None   Collection Time: 09/12/18  8:44 PM   Specimen: Nasopharyngeal Swab  Result Value Ref Range Status   SARS Coronavirus 2 NEGATIVE NEGATIVE Final    Comment: (NOTE) If result is NEGATIVE SARS-CoV-2 target nucleic acids are NOT DETECTED. The SARS-CoV-2 RNA is generally detectable in upper and lower  respiratory specimens during the acute phase of infection. The lowest  concentration of SARS-CoV-2 viral copies this assay can detect is 250  copies / mL. A negative result does not preclude SARS-CoV-2 infection  and should not be used as the sole basis for treatment or other  patient management decisions.  A negative result may occur with  improper specimen collection / handling, submission of specimen other  than nasopharyngeal swab, presence of viral mutation(s) within the  areas targeted by this assay, and inadequate number of viral copies  (<250 copies / mL). A negative result must be combined with clinical  observations, patient history, and epidemiological information. If result is POSITIVE SARS-CoV-2 target nucleic acids are DETECTED. The SARS-CoV-2 RNA is generally detectable in upper and lower  respiratory specimens dur ing the acute phase of infection.  Positive  results are indicative of active infection with SARS-CoV-2.  Clinical  correlation with patient history and other diagnostic information is  necessary to determine patient infection status.  Positive results do  not rule out bacterial infection or co-infection with other viruses. If result is PRESUMPTIVE POSTIVE SARS-CoV-2 nucleic acids MAY BE PRESENT.   A presumptive positive result was obtained on the submitted specimen  and confirmed on repeat testing.  While 2019 novel coronavirus  (SARS-CoV-2) nucleic acids may be present in the submitted sample  additional confirmatory testing  may be necessary for epidemiological  and / or clinical management purposes  to differentiate between  SARS-CoV-2 and other Sarbecovirus currently known to infect humans.  If clinically indicated additional testing with an alternate test  methodology [phone]) is advised. The SARS-CoV-2 RNA is generally  detectable in upper and lower respiratory sp ecimens during the acute  phase of infection. The expected result is Negative. Fact Sheet for Patients:  StrictlyIdeas.no Fact Sheet for Healthcare Providers: BankingDealers.co.za This test is not yet approved or cleared by the [place] FDA and has been authorized for detection and/or diagnosis of SARS-CoV-2 by FDA under an Emergency Use Authorization (EUA).  This EUA will remain in effect (meaning this test can be used) for the duration of the COVID-19 declaration under Section 564(b)(1) of the Act, 21 U.S.C. section 360bbb-3(b)(1), unless the authorization is terminated or revoked sooner. Performed at [facility], [place] [address] [place], [place] [address]   Blood culture (routine x 2)     Status: None   Collection Time: 09/12/18 11:05 PM   Specimen: BLOOD LEFT HAND  Result Value Ref Range Status   Specimen Description BLOOD LEFT HAND  Final   Special Requests   Final    BOTTLES DRAWN AEROBIC ONLY Blood Culture results may not be optimal due to an inadequate volume of blood received in culture bottles   Culture   Final    NO GROWTH 5 DAYS Performed at [facility], [place] [address] [place], [place] [address]    Report Status 09/17/2018 FINAL  Final  MRSA PCR Screening     Status: None   Collection Time: 09/12/18 11:57 PM   Specimen: Nasal Mucosa; Nasopharyngeal  Result Value Ref Range Status   MRSA by PCR NEGATIVE NEGATIVE Final    Comment:        The GeneXpert MRSA Assay (FDA approved for NASAL specimens only), is one component of a comprehensive MRSA  colonization surveillance program. It is not intended to diagnose MRSA infection nor to guide or monitor treatment for MRSA infections. Performed at [facility], [place] [address] [place],  [address]   Culture, blood (Routine X 2)  w Reflex to ID Panel     Status: None   Collection Time: 09/13/18  1:27 AM   Specimen: BLOOD RIGHT HAND  Result Value Ref Range Status   Specimen Description BLOOD RIGHT HAND  Final   Special Requests   Final    BOTTLES DRAWN AEROBIC ONLY Blood Culture results may not be optimal due to an inadequate volume of blood received in culture bottles   Culture   Final    NO GROWTH 5 DAYS Performed at [facility], [place] [address] [place], [place] [address]    Report Status 09/18/2018 FINAL  Final  Novel Coronavirus, NAA (hospital order; send-out to ref lab)     Status: None   Collection Time: 09/17/18  4:55 PM   Specimen: Nasopharyngeal Swab; Respiratory  Result Value Ref Range Status   SARS-CoV-2, NAA NOT DETECTED NOT DETECTED Final    Comment: (NOTE) This test was developed and its performance characteristics determined by [employer]. This test has not been FDA cleared or approved. This test has been authorized by FDA under an Emergency Use Authorization (EUA). This test is only authorized for the duration of time the declaration that circumstances exist justifying the authorization of the emergency use of in vitro diagnostic tests for detection of SARS-CoV-2 virus and/or diagnosis of COVID-19 infection under section 564(b)(1) of the Act, 21 U.S.C. [id number]), unless the authorization is terminated or revoked sooner. When diagnostic testing is negative, the possibility of a false negative result should be considered in the context of a patient's recent exposures and the presence of clinical signs and symptoms consistent with COVID-19. An individual without symptoms of COVID-19 and who is not shedding SARS-CoV-2 virus would  expect to have a negative (not detected) result in this assay. Performed  At: [facility] [address] [place], [place] [id number] Rush Farmer MD [id number]    [place]  Final    Comment: Performed at [facility], [place] [address] [place], [place] [address]     Time coordinating discharge: 40 minutes  SIGNED:   Elmarie Shiley, MD  [facility]

## 2018-09-20 NOTE — Plan of Care (Signed)
Progressing towards goals

## 2018-09-23 DIAGNOSIS — I5021 Acute systolic (congestive) heart failure: Secondary | ICD-10-CM | POA: Diagnosis not present

## 2018-09-23 DIAGNOSIS — R1312 Dysphagia, oropharyngeal phase: Secondary | ICD-10-CM | POA: Diagnosis not present

## 2018-09-23 DIAGNOSIS — G8191 Hemiplegia, unspecified affecting right dominant side: Secondary | ICD-10-CM | POA: Diagnosis not present

## 2018-09-23 DIAGNOSIS — I69051 Hemiplegia and hemiparesis following nontraumatic subarachnoid hemorrhage affecting right dominant side: Secondary | ICD-10-CM | POA: Diagnosis not present

## 2018-09-23 DIAGNOSIS — I69991 Dysphagia following unspecified cerebrovascular disease: Secondary | ICD-10-CM | POA: Diagnosis not present

## 2018-09-23 DIAGNOSIS — I6789 Other cerebrovascular disease: Secondary | ICD-10-CM | POA: Diagnosis not present

## 2018-09-23 DIAGNOSIS — Z7901 Long term (current) use of anticoagulants: Secondary | ICD-10-CM | POA: Diagnosis not present

## 2018-09-23 DIAGNOSIS — N178 Other acute kidney failure: Secondary | ICD-10-CM | POA: Diagnosis not present

## 2018-09-23 DIAGNOSIS — W1789XA Other fall from one level to another, initial encounter: Secondary | ICD-10-CM | POA: Diagnosis not present

## 2018-09-23 DIAGNOSIS — R531 Weakness: Secondary | ICD-10-CM | POA: Diagnosis not present

## 2018-09-23 DIAGNOSIS — R2689 Other abnormalities of gait and mobility: Secondary | ICD-10-CM | POA: Diagnosis not present

## 2018-09-23 DIAGNOSIS — M6281 Muscle weakness (generalized): Secondary | ICD-10-CM | POA: Diagnosis not present

## 2018-09-23 DIAGNOSIS — Z5181 Encounter for therapeutic drug level monitoring: Secondary | ICD-10-CM | POA: Diagnosis not present

## 2018-09-23 DIAGNOSIS — R278 Other lack of coordination: Secondary | ICD-10-CM | POA: Diagnosis not present

## 2018-09-23 DIAGNOSIS — R2681 Unsteadiness on feet: Secondary | ICD-10-CM | POA: Diagnosis not present

## 2018-09-23 DIAGNOSIS — J9601 Acute respiratory failure with hypoxia: Secondary | ICD-10-CM | POA: Diagnosis not present

## 2018-09-23 DIAGNOSIS — I69959 Hemiplegia and hemiparesis following unspecified cerebrovascular disease affecting unspecified side: Secondary | ICD-10-CM | POA: Diagnosis not present

## 2018-09-23 DIAGNOSIS — I428 Other cardiomyopathies: Secondary | ICD-10-CM | POA: Diagnosis not present

## 2018-09-23 DIAGNOSIS — I10 Essential (primary) hypertension: Secondary | ICD-10-CM | POA: Diagnosis not present

## 2018-09-23 DIAGNOSIS — Z7189 Other specified counseling: Secondary | ICD-10-CM | POA: Diagnosis not present

## 2018-09-23 DIAGNOSIS — G4089 Other seizures: Secondary | ICD-10-CM | POA: Diagnosis not present

## 2018-09-23 DIAGNOSIS — R41841 Cognitive communication deficit: Secondary | ICD-10-CM | POA: Diagnosis not present

## 2018-09-23 DIAGNOSIS — I6389 Other cerebral infarction: Secondary | ICD-10-CM | POA: Diagnosis not present

## 2018-09-24 DIAGNOSIS — I6389 Other cerebral infarction: Secondary | ICD-10-CM | POA: Diagnosis not present

## 2018-09-24 DIAGNOSIS — W1789XA Other fall from one level to another, initial encounter: Secondary | ICD-10-CM | POA: Diagnosis not present

## 2018-09-24 DIAGNOSIS — G4089 Other seizures: Secondary | ICD-10-CM | POA: Diagnosis not present

## 2018-09-24 DIAGNOSIS — I10 Essential (primary) hypertension: Secondary | ICD-10-CM | POA: Diagnosis not present

## 2018-10-03 DIAGNOSIS — R2689 Other abnormalities of gait and mobility: Secondary | ICD-10-CM | POA: Diagnosis not present

## 2018-10-03 DIAGNOSIS — I6789 Other cerebrovascular disease: Secondary | ICD-10-CM | POA: Diagnosis not present

## 2018-10-03 DIAGNOSIS — R531 Weakness: Secondary | ICD-10-CM | POA: Diagnosis not present

## 2018-10-03 DIAGNOSIS — I69959 Hemiplegia and hemiparesis following unspecified cerebrovascular disease affecting unspecified side: Secondary | ICD-10-CM | POA: Diagnosis not present

## 2018-10-08 DIAGNOSIS — I5021 Acute systolic (congestive) heart failure: Secondary | ICD-10-CM | POA: Diagnosis not present

## 2018-10-08 DIAGNOSIS — I69991 Dysphagia following unspecified cerebrovascular disease: Secondary | ICD-10-CM | POA: Diagnosis not present

## 2018-10-08 DIAGNOSIS — G4089 Other seizures: Secondary | ICD-10-CM | POA: Diagnosis not present

## 2018-10-08 DIAGNOSIS — Z7189 Other specified counseling: Secondary | ICD-10-CM | POA: Diagnosis not present

## 2018-10-08 DIAGNOSIS — G8191 Hemiplegia, unspecified affecting right dominant side: Secondary | ICD-10-CM | POA: Diagnosis not present

## 2018-10-08 DIAGNOSIS — I6389 Other cerebral infarction: Secondary | ICD-10-CM | POA: Diagnosis not present

## 2018-10-08 DIAGNOSIS — Z7901 Long term (current) use of anticoagulants: Secondary | ICD-10-CM | POA: Diagnosis not present

## 2018-10-08 DIAGNOSIS — I10 Essential (primary) hypertension: Secondary | ICD-10-CM | POA: Diagnosis not present

## 2018-10-09 ENCOUNTER — Telehealth: Payer: Self-pay | Admitting: Family Medicine

## 2018-10-09 DIAGNOSIS — Z7901 Long term (current) use of anticoagulants: Secondary | ICD-10-CM | POA: Diagnosis not present

## 2018-10-09 DIAGNOSIS — Z7189 Other specified counseling: Secondary | ICD-10-CM | POA: Diagnosis not present

## 2018-10-09 DIAGNOSIS — I10 Essential (primary) hypertension: Secondary | ICD-10-CM | POA: Diagnosis not present

## 2018-10-09 DIAGNOSIS — Z5181 Encounter for therapeutic drug level monitoring: Secondary | ICD-10-CM | POA: Diagnosis not present

## 2018-10-09 NOTE — Telephone Encounter (Unsigned)
Copied from [place] [id number]. Topic: Quick Communication - Home Health Verbal Orders >> Oct 09, 2018 12:24 PM Yvette Rack wrote: Caller/Agency: Lavella Lemons with [facility] Number: [phone] Requesting OT/PT/Skilled Nursing/Social Work/Speech Therapy: skilled nursing, PT, OT, and aide Frequency: evaluation

## 2018-10-10 ENCOUNTER — Encounter: Payer: Self-pay | Admitting: Family Medicine

## 2018-10-10 ENCOUNTER — Other Ambulatory Visit: Payer: Self-pay

## 2018-10-10 ENCOUNTER — Ambulatory Visit (INDEPENDENT_AMBULATORY_CARE_PROVIDER_SITE_OTHER): Payer: Medicare HMO | Admitting: Family Medicine

## 2018-10-10 VITALS — BP 134/66 | HR 77 | Temp 97.6°F | Ht 62.0 in

## 2018-10-10 DIAGNOSIS — Z23 Encounter for immunization: Secondary | ICD-10-CM

## 2018-10-10 DIAGNOSIS — Z1211 Encounter for screening for malignant neoplasm of colon: Secondary | ICD-10-CM | POA: Diagnosis not present

## 2018-10-10 DIAGNOSIS — G40909 Epilepsy, unspecified, not intractable, without status epilepticus: Secondary | ICD-10-CM

## 2018-10-10 DIAGNOSIS — I5042 Chronic combined systolic (congestive) and diastolic (congestive) heart failure: Secondary | ICD-10-CM | POA: Diagnosis not present

## 2018-10-10 DIAGNOSIS — I10 Essential (primary) hypertension: Secondary | ICD-10-CM

## 2018-10-10 DIAGNOSIS — I69959 Hemiplegia and hemiparesis following unspecified cerebrovascular disease affecting unspecified side: Secondary | ICD-10-CM | POA: Diagnosis not present

## 2018-10-10 DIAGNOSIS — Z8673 Personal history of transient ischemic attack (TIA), and cerebral infarction without residual deficits: Secondary | ICD-10-CM

## 2018-10-10 DIAGNOSIS — Z7901 Long term (current) use of anticoagulants: Secondary | ICD-10-CM

## 2018-10-10 DIAGNOSIS — Z5181 Encounter for therapeutic drug level monitoring: Secondary | ICD-10-CM | POA: Diagnosis not present

## 2018-10-10 NOTE — Patient Instructions (Signed)
° ° ° °  If you have lab work done today you will be contacted with your lab results within the next 2 weeks.  If you have not heard from us then please contact us. The fastest way to get your results is to register for My Chart. ° ° °IF you received an x-ray today, you will receive an invoice from [facility]. Please contact  Radiology at [phone] with questions or concerns regarding your invoice.  ° °IF you received labwork today, you will receive an invoice from [facility]. Please contact [facility] at [phone] with questions or concerns regarding your invoice.  ° °Our billing staff will not be able to assist you with questions regarding bills from these companies. ° °You will be contacted with the lab results as soon as they are available. The fastest way to get your results is to activate your My Chart account. Instructions are located on the last page of this paperwork. If you have not heard from us regarding the results in 2 weeks, please contact this office. °  ° ° ° °

## 2018-10-10 NOTE — Progress Notes (Signed)
9/18/202010:31 AM  [name] [birth year], 68 y.o., female [id number]  Chief Complaint  Patient presents with  . Follow-up    just released from rehab yesterday due to fall, had seizure. In icu for 4 days.    HPI:   Patient is a 68 y.o. female with past medical history significant for embolic CVA sequela, on coumadin, seizures, HTN with sCHF on digoxin, HLP, sickle cell anemia, vitamin D deficiency, osteoporosis with h/o hip fracture  who presents today for hosp followup  hosp from 8/21-29 for status epilepticus, intubated, after running out of meds  Had chest pain with t wave inversion and elevated trops - stress test no sign evidence of reversible ischemia Echo - LVEF 35-40% with mod hypokinesis, Diastolic dysfunction present, mod AR Required lasix for pulmonary edema Started on losartan INR managed by [facility], [facility] She has not seen a neurologist in years Has not seen cards in years  She was in inpatient rehab for a week Now home Appetite good, at home she does not follow dysphagia diet, eats normal foods wo choking/coughing. No more seizures since hosp release At home she uses a 4 pronged walker Able to bath, uses restroom independently, able to make her bed, help in the kitchen No falls Might be having all her teeth extracted  Lab Results  Component Value Date   CREATININE 1.00 09/20/2018   BUN 22 09/20/2018   NA 138 09/20/2018   K 4.0 09/20/2018   CL 103 09/20/2018   CO2 24 09/20/2018   Lab Results  Component Value Date   CHOL 134 06/25/2018   HDL 42 06/25/2018   LDLCALC 71 06/25/2018   TRIG 82 09/14/2018   CHOLHDL 3.2 06/25/2018    Depression screen [facility] 2/9 08/28/2017 08/07/2017 01/12/2017  Decreased Interest 0 0 0  Down, Depressed, Hopeless 0 0 0  PHQ - 2 Score 0 0 0    Fall Risk  10/10/2018 08/28/2017 08/07/2017 01/12/2017 04/16/2016  Falls in the past year? 0 No No No No  Number falls in past yr: 1 - - - -  Injury with Fall? 1 - - - -  Risk for  fall due to : - - - - -     Allergies  Allergen Reactions  . Lexiscan [Regadenoson] Other (See Comments)    Perioral tingling and throat tightness    Prior to Admission medications   Medication Sig Start Date End Date Taking? Authorizing Provider  alendronate (FOSAMAX) 70 MG tablet TAKE 1 TABLET EVERY 7 DAYS WITH FULL GLASS OF WATER ON AN EMPTY STOMACH 03/04/18  Yes [name], MD  atorvastatin (LIPITOR) 80 MG tablet Take 1 tablet (80 mg total) by mouth at bedtime. 09/10/18  Yes Rutherford Guys, MD  baclofen (LIORESAL) 20 MG tablet TAKE 1 TABLET THREE TIMES DAILY (MAY TAKE AN ADDITIONAL TABLET MID-DAY OR BEFORE BED AS NEEDED FOR MUSCLE SPASMS) 06/09/18  Yes Rutherford Guys, MD  capsicum oleoresin (TRIXAICIN) 0.025 % cream Apply 1 application topically daily as needed (arthiritis pain).   Yes [provider]  carvedilol (COREG) 6.25 MG tablet Take 1 tablet (6.25 mg total) by mouth 2 (two) times daily. 09/10/18  Yes Rutherford Guys, MD  digoxin (LANOXIN) 0.125 MG tablet TAKE 1 TABLET (125 MCG TOTAL) BY MOUTH DAILY. 09/03/18  Yes Rutherford Guys, MD  furosemide (LASIX) 40 MG tablet Take 1 tablet (40 mg total) by mouth daily. 09/21/18  Yes Regalado, Belkys A, MD  lacosamide 100 MG  TABS Take 1 tablet (100 mg total) by mouth 2 (two) times daily. 09/18/18  Yes Regalado, Belkys A, MD  levETIRAcetam (KEPPRA) 750 MG tablet Take 2 tablets (1,500 mg total) by mouth 2 (two) times daily. 09/18/18 10/18/18 Yes Regalado, Belkys A, MD  losartan (COZAAR) 25 MG tablet Take 1 tablet (25 mg total) by mouth daily. 09/20/18  Yes Regalado, Belkys A, MD  nystatin (NYSTATIN) powder Apply topically daily. Apply under breasts once daily after bath   Yes [provider]  phenytoin (DILANTIN) 125 MG/5ML suspension Phenytoin Morning Evening 8/26-8/27 148m  612m8/28-8/29 6091m           88m49m30-8/31 88mg65m         30mg 37m9/2 30mg 372m9/82m4 30mg STO64m5 STOP STOP 09/18/18  Yes Regalado, Belkys A,  MD  polyethylene glycol (MIRALAX / GLYCOLAX) packet Take 17 g by mouth daily as needed for moderate constipation.   Yes [provider]  triamcinolone cream (KENALOG) 0.1 % Apply 1 application topically 2 (two) times daily. 03/26/14  Yes Shaw, EvaShawnee Knappamin D, Ergocalciferol, (DRISDOL) 1.25 MG (50000 UT) CAPS capsule TAKE 1 CAPSULE EVERY 7 DAYS 09/04/18  Yes Santiago,Rutherford Guysfarin (COUMADIN) 5 MG tablet TAKE AS DIRECTED BY COUMADIN CLINIC. NEED OFFICE VISIT WITH FASTING LABS FOR REFILLS Patient taking differently: Take 5 mg by mouth one time only at 6 PM. Take 1 tablet (5mg) po d7my at 6PM. 09/03/18  Yes Santiago, Rutherford Guysst Medical History:  Diagnosis Date  . Adenomatous polyp of colon 09/2012   repeat colonoscopy in 5 years by Dr. Patterson Sharlett Ilesongestive heart failure) (HCC)    EF 15-20% as of 05/28/11 (Dr Michael RiLegrand [facility] D/C summary)  . CVA (cerebral infarction)   . Hemiparesis (HCC)   . HMarquetterlipidemia   . Hypertension   . Seizures (HCC)   . SSan Marinole cell anemia (HCC)   . Stroke (HCC)   . VArnold Linemin D deficiency 2010    Past Surgical History:  Procedure Laterality Date  . ABDOMINAL HYSTERECTOMY    . FRACTURE SURGERY    . HIP SURGERY    . TUBAL LIGATION      Social History   Tobacco Use  . Smoking status: Former Smoker    Types: Cigarettes    Quit date: 09/26/2007    Years since quitting: 11.0  . Smokeless tobacco: Never Used  Substance Use Topics  . Alcohol use: No    Family History  Problem Relation Age of Onset  . Diabetes Daughter   . Colon cancer Maternal Grandfather     Review of Systems  Constitutional: Negative for chills and fever.  Respiratory: Negative for cough and shortness of breath.   Cardiovascular: Negative for chest pain, palpitations and leg swelling.  Gastrointestinal: Negative for abdominal pain, nausea and vomiting.   Per hpi  OBJECTIVE:  Today's Vitals   10/10/18 1022  BP: 134/66  Pulse: 77  Temp:  97.6 F (36.4 C)  SpO2: 98%  Height: '5\' 2"'  (1.575 m)   Body mass index is 26.69 kg/m.   Physical Exam Vitals signs and nursing note reviewed.  Constitutional:      Appearance: She is well-developed.  HENT:     Head: Normocephalic and atraumatic.     Mouth/Throat:     Pharynx: No oropharyngeal exudate.  Eyes:     General: No scleral icterus.  Conjunctiva/sclera: Conjunctivae normal.     Pupils: Pupils are equal, round, and reactive to light.  Neck:     Musculoskeletal: Neck supple.  Cardiovascular:     Rate and Rhythm: Normal rate and regular rhythm.     Heart sounds: Murmur present. No friction rub. No gallop.   Pulmonary:     Effort: Pulmonary effort is normal.     Breath sounds: Normal breath sounds. No wheezing, rhonchi or rales.  Musculoskeletal:     Right lower leg: No edema.     Left lower leg: No edema.  Skin:    General: Skin is warm and dry.  Neurological:     Mental Status: She is alert and oriented to person, place, and time.     Motor: Weakness present.     Gait: Gait abnormal.     No results found for this or any previous visit (from the past 24 hour(s)).  No results found.   ASSESSMENT and PLAN  1. Essential hypertension Controlled. Continue current regime.  - CMP14+EGFR  2. Seizure disorder ([place]) Stable now that she is back on meds. Referring to neurology for further eval and treatment, med mgt. - Ambulatory referral to Neurology  3. Hemiplegia of dominant side as late effect following cerebrovascular disease ([place]) 4. History of CVA (cerebrovascular accident) Stable. Has regained baseline function. Uses ALSO for right foot and brace for RUE.   5. Anticoagulated on Coumadin Unclear if h/o paroxysmal afib. Managed by [facility] at [facility] - CBC  6. Chronic combined systolic and diastolic heart failure (HCC) Euvolemic. Stable. Wondering if digoxin still needed. Referring to cards for further eval and treatment.  - Ambulatory referral to  Cardiology  7. Need for immunization against influenza - Flu Vaccine QUAD High Dose(Fluad)  8. Screening for colon cancer - Cologuard  9. Medication monitoring encounter - Digoxin, Random, Serum - Ambulatory referral to Cardiology  Return in about 6 months (around 04/09/2019).    Rutherford Guys, MD Primary Care at [place], [place] [address] Ph.  [phone] Fax [phone]

## 2018-10-11 LAB — CMP14+EGFR
ALT: 20 IU/L (ref 0–32)
AST: 38 IU/L (ref 0–40)
Albumin/Globulin Ratio: 1.5 (ref 1.2–2.2)
Albumin: 4.4 g/dL (ref 3.8–4.8)
Alkaline Phosphatase: 140 IU/L — ABNORMAL HIGH (ref 39–117)
BUN/Creatinine Ratio: 18 (ref 12–28)
BUN: 15 mg/dL (ref 8–27)
Bilirubin Total: 0.2 mg/dL (ref 0.0–1.2)
CO2: 21 mmol/L (ref 20–29)
Calcium: 9.7 mg/dL (ref 8.7–10.3)
Chloride: 107 mmol/L — ABNORMAL HIGH (ref 96–106)
Creatinine, Ser: 0.82 mg/dL (ref 0.57–1.00)
GFR calc Af Amer: 84 mL/min/{1.73_m2} (ref >59)
GFR calc non Af Amer: 73 mL/min/{1.73_m2} (ref >59)
Globulin, Total: 3 g/dL (ref 1.5–4.5)
Glucose: 121 mg/dL — ABNORMAL HIGH (ref 65–99)
Potassium: 3.9 mmol/L (ref 3.5–5.2)
Sodium: 144 mmol/L (ref 134–144)
Total Protein: 7.4 g/dL (ref 6.0–8.5)

## 2018-10-11 LAB — CBC
Hematocrit: 41.7 % (ref 34.0–46.6)
Hemoglobin: 14.2 g/dL (ref 11.1–15.9)
MCH: 28.3 pg (ref 26.6–33.0)
MCHC: 34.1 g/dL (ref 31.5–35.7)
MCV: 83 fL (ref 79–97)
Platelets: 308 10*3/uL (ref 150–450)
RBC: 5.01 x10E6/uL (ref 3.77–5.28)
RDW: 13.2 % (ref 11.7–15.4)
WBC: 9.9 10*3/uL (ref 3.4–10.8)

## 2018-10-11 LAB — DIGOXIN, RANDOM, SERUM: Digoxin, Random, Serum: 1.2 ng/mL

## 2018-10-12 DIAGNOSIS — I69391 Dysphagia following cerebral infarction: Secondary | ICD-10-CM | POA: Diagnosis not present

## 2018-10-12 DIAGNOSIS — R1312 Dysphagia, oropharyngeal phase: Secondary | ICD-10-CM | POA: Diagnosis not present

## 2018-10-12 DIAGNOSIS — G40909 Epilepsy, unspecified, not intractable, without status epilepticus: Secondary | ICD-10-CM | POA: Diagnosis not present

## 2018-10-12 DIAGNOSIS — I110 Hypertensive heart disease with heart failure: Secondary | ICD-10-CM | POA: Diagnosis not present

## 2018-10-12 DIAGNOSIS — I69351 Hemiplegia and hemiparesis following cerebral infarction affecting right dominant side: Secondary | ICD-10-CM | POA: Diagnosis not present

## 2018-10-12 DIAGNOSIS — I69320 Aphasia following cerebral infarction: Secondary | ICD-10-CM | POA: Diagnosis not present

## 2018-10-12 DIAGNOSIS — E559 Vitamin D deficiency, unspecified: Secondary | ICD-10-CM | POA: Diagnosis not present

## 2018-10-12 DIAGNOSIS — I5020 Unspecified systolic (congestive) heart failure: Secondary | ICD-10-CM | POA: Diagnosis not present

## 2018-10-12 DIAGNOSIS — M5030 Other cervical disc degeneration, unspecified cervical region: Secondary | ICD-10-CM | POA: Diagnosis not present

## 2018-10-15 DIAGNOSIS — I69391 Dysphagia following cerebral infarction: Secondary | ICD-10-CM | POA: Diagnosis not present

## 2018-10-15 DIAGNOSIS — M5030 Other cervical disc degeneration, unspecified cervical region: Secondary | ICD-10-CM | POA: Diagnosis not present

## 2018-10-15 DIAGNOSIS — I639 Cerebral infarction, unspecified: Secondary | ICD-10-CM | POA: Diagnosis not present

## 2018-10-15 DIAGNOSIS — E559 Vitamin D deficiency, unspecified: Secondary | ICD-10-CM | POA: Diagnosis not present

## 2018-10-15 DIAGNOSIS — I69320 Aphasia following cerebral infarction: Secondary | ICD-10-CM | POA: Diagnosis not present

## 2018-10-15 DIAGNOSIS — R1312 Dysphagia, oropharyngeal phase: Secondary | ICD-10-CM | POA: Diagnosis not present

## 2018-10-15 DIAGNOSIS — I5020 Unspecified systolic (congestive) heart failure: Secondary | ICD-10-CM | POA: Diagnosis not present

## 2018-10-15 DIAGNOSIS — Z7901 Long term (current) use of anticoagulants: Secondary | ICD-10-CM | POA: Diagnosis not present

## 2018-10-15 DIAGNOSIS — G40909 Epilepsy, unspecified, not intractable, without status epilepticus: Secondary | ICD-10-CM | POA: Diagnosis not present

## 2018-10-15 DIAGNOSIS — I110 Hypertensive heart disease with heart failure: Secondary | ICD-10-CM | POA: Diagnosis not present

## 2018-10-15 DIAGNOSIS — I69351 Hemiplegia and hemiparesis following cerebral infarction affecting right dominant side: Secondary | ICD-10-CM | POA: Diagnosis not present

## 2018-10-19 DIAGNOSIS — I5020 Unspecified systolic (congestive) heart failure: Secondary | ICD-10-CM | POA: Diagnosis not present

## 2018-10-19 DIAGNOSIS — I69391 Dysphagia following cerebral infarction: Secondary | ICD-10-CM | POA: Diagnosis not present

## 2018-10-19 DIAGNOSIS — M5030 Other cervical disc degeneration, unspecified cervical region: Secondary | ICD-10-CM | POA: Diagnosis not present

## 2018-10-19 DIAGNOSIS — I69320 Aphasia following cerebral infarction: Secondary | ICD-10-CM | POA: Diagnosis not present

## 2018-10-19 DIAGNOSIS — E559 Vitamin D deficiency, unspecified: Secondary | ICD-10-CM | POA: Diagnosis not present

## 2018-10-19 DIAGNOSIS — R1312 Dysphagia, oropharyngeal phase: Secondary | ICD-10-CM | POA: Diagnosis not present

## 2018-10-19 DIAGNOSIS — G40909 Epilepsy, unspecified, not intractable, without status epilepticus: Secondary | ICD-10-CM | POA: Diagnosis not present

## 2018-10-19 DIAGNOSIS — I69351 Hemiplegia and hemiparesis following cerebral infarction affecting right dominant side: Secondary | ICD-10-CM | POA: Diagnosis not present

## 2018-10-19 DIAGNOSIS — I110 Hypertensive heart disease with heart failure: Secondary | ICD-10-CM | POA: Diagnosis not present
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
## 2018-10-20 ENCOUNTER — Ambulatory Visit (INDEPENDENT_AMBULATORY_CARE_PROVIDER_SITE_OTHER): Payer: Medicare HMO | Admitting: Cardiovascular Disease

## 2018-10-20 ENCOUNTER — Other Ambulatory Visit: Payer: Self-pay

## 2018-10-20 ENCOUNTER — Encounter: Payer: Self-pay | Admitting: Cardiovascular Disease

## 2018-10-20 VITALS — BP 187/66 | HR 55 | Temp 97.3°F | Ht 62.0 in | Wt 137.8 lb

## 2018-10-20 DIAGNOSIS — I6340 Cerebral infarction due to embolism of unspecified cerebral artery: Secondary | ICD-10-CM | POA: Diagnosis not present

## 2018-10-20 DIAGNOSIS — I5042 Chronic combined systolic (congestive) and diastolic (congestive) heart failure: Secondary | ICD-10-CM | POA: Diagnosis not present

## 2018-10-20 DIAGNOSIS — I10 Essential (primary) hypertension: Secondary | ICD-10-CM

## 2018-10-20 MED ORDER — SACUBITRIL-VALSARTAN 24-26 MG PO TABS: 1.0000 | ORAL_TABLET | Freq: Two times a day (BID) | ORAL | 5 refills | Status: DC

## 2018-10-20 NOTE — Patient Instructions (Signed)
Medication Instructions:  STOP LOSARTAN   START ENTRESTO 24-26 MG TWICE A DAY  If you need a refill on your cardiac medications before your next appointment, please call your pharmacy.   Lab work: NONE  Testing/Procedures: NONE  Follow-Up: At [facility], you and your health needs are our priority.  As part of our continuing mission to provide you with exceptional heart care, we have created designated Provider Care Teams.  These Care Teams include your primary Cardiologist (physician) and Advanced Practice Providers (APPs -  Physician Assistants and Nurse Practitioners) who all work together to provide you with the care you need, when you need it. You will need a follow up appointment in 6 weeks.  Please call our office 2 months in advance to schedule this appointment.  You may see Skeet Latch, MD   or one of the following Advanced Practice Providers on your designated Care Team:   Kerin Ransom, PA-C Roby Lofts, [place] . Sande Rives, PA-C

## 2018-10-20 NOTE — Addendum Note (Signed)
Addended by: Earvin Hansen on: 10/20/2018 06:26 PM   Modules accepted: Orders

## 2018-10-21 ENCOUNTER — Telehealth: Payer: Self-pay | Admitting: Family Medicine

## 2018-10-21 DIAGNOSIS — I5020 Unspecified systolic (congestive) heart failure: Secondary | ICD-10-CM | POA: Diagnosis not present

## 2018-10-21 DIAGNOSIS — I69320 Aphasia following cerebral infarction: Secondary | ICD-10-CM | POA: Diagnosis not present

## 2018-10-21 DIAGNOSIS — M5030 Other cervical disc degeneration, unspecified cervical region: Secondary | ICD-10-CM | POA: Diagnosis not present

## 2018-10-21 DIAGNOSIS — I69391 Dysphagia following cerebral infarction: Secondary | ICD-10-CM | POA: Diagnosis not present

## 2018-10-21 DIAGNOSIS — E559 Vitamin D deficiency, unspecified: Secondary | ICD-10-CM | POA: Diagnosis not present

## 2018-10-21 DIAGNOSIS — I69351 Hemiplegia and hemiparesis following cerebral infarction affecting right dominant side: Secondary | ICD-10-CM | POA: Diagnosis not present

## 2018-10-21 DIAGNOSIS — G40909 Epilepsy, unspecified, not intractable, without status epilepticus: Secondary | ICD-10-CM | POA: Diagnosis not present

## 2018-10-21 DIAGNOSIS — I110 Hypertensive heart disease with heart failure: Secondary | ICD-10-CM | POA: Diagnosis not present

## 2018-10-21 DIAGNOSIS — R1312 Dysphagia, oropharyngeal phase: Secondary | ICD-10-CM | POA: Diagnosis not present

## 2018-10-21 NOTE — Telephone Encounter (Signed)
Verbal given 

## 2018-10-21 NOTE — Telephone Encounter (Signed)
error 

## 2018-10-22 ENCOUNTER — Telehealth: Payer: Self-pay | Admitting: Family Medicine

## 2018-10-22 DIAGNOSIS — G40909 Epilepsy, unspecified, not intractable, without status epilepticus: Secondary | ICD-10-CM | POA: Diagnosis not present

## 2018-10-22 DIAGNOSIS — I5020 Unspecified systolic (congestive) heart failure: Secondary | ICD-10-CM | POA: Diagnosis not present

## 2018-10-22 DIAGNOSIS — I110 Hypertensive heart disease with heart failure: Secondary | ICD-10-CM | POA: Diagnosis not present

## 2018-10-22 DIAGNOSIS — I69391 Dysphagia following cerebral infarction: Secondary | ICD-10-CM | POA: Diagnosis not present

## 2018-10-22 DIAGNOSIS — E559 Vitamin D deficiency, unspecified: Secondary | ICD-10-CM | POA: Diagnosis not present

## 2018-10-22 DIAGNOSIS — I69351 Hemiplegia and hemiparesis following cerebral infarction affecting right dominant side: Secondary | ICD-10-CM | POA: Diagnosis not present

## 2018-10-22 DIAGNOSIS — I69320 Aphasia following cerebral infarction: Secondary | ICD-10-CM | POA: Diagnosis not present

## 2018-10-22 DIAGNOSIS — M5030 Other cervical disc degeneration, unspecified cervical region: Secondary | ICD-10-CM | POA: Diagnosis not present

## 2018-10-22 DIAGNOSIS — R1312 Dysphagia, oropharyngeal phase: Secondary | ICD-10-CM | POA: Diagnosis not present

## 2018-10-22 NOTE — Telephone Encounter (Signed)
Copied from [place] [phone]. Topic: General - Other >> Oct 21, 2018  4:41 PM Rainey Pines A wrote: Radovan OT with [facility] needs verbal orders for OT 1w5  Contact [phone]

## 2018-10-22 NOTE — Telephone Encounter (Signed)
Left message   Ok to give verbal for OT

## 2018-10-23 ENCOUNTER — Encounter: Payer: Self-pay | Admitting: Radiology

## 2018-10-23 ENCOUNTER — Encounter: Payer: Medicare HMO | Admitting: Family Medicine

## 2018-10-23 DIAGNOSIS — I69320 Aphasia following cerebral infarction: Secondary | ICD-10-CM | POA: Diagnosis not present

## 2018-10-23 DIAGNOSIS — M5030 Other cervical disc degeneration, unspecified cervical region: Secondary | ICD-10-CM | POA: Diagnosis not present

## 2018-10-23 DIAGNOSIS — I110 Hypertensive heart disease with heart failure: Secondary | ICD-10-CM | POA: Diagnosis not present

## 2018-10-23 DIAGNOSIS — G40909 Epilepsy, unspecified, not intractable, without status epilepticus: Secondary | ICD-10-CM | POA: Diagnosis not present

## 2018-10-23 DIAGNOSIS — R1312 Dysphagia, oropharyngeal phase: Secondary | ICD-10-CM | POA: Diagnosis not present

## 2018-10-23 DIAGNOSIS — E559 Vitamin D deficiency, unspecified: Secondary | ICD-10-CM | POA: Diagnosis not present

## 2018-10-23 DIAGNOSIS — I5020 Unspecified systolic (congestive) heart failure: Secondary | ICD-10-CM | POA: Diagnosis not present

## 2018-10-23 DIAGNOSIS — I69391 Dysphagia following cerebral infarction: Secondary | ICD-10-CM | POA: Diagnosis not present

## 2018-10-23 DIAGNOSIS — I69351 Hemiplegia and hemiparesis following cerebral infarction affecting right dominant side: Secondary | ICD-10-CM | POA: Diagnosis not present

## 2018-10-24 DIAGNOSIS — E559 Vitamin D deficiency, unspecified: Secondary | ICD-10-CM | POA: Diagnosis not present

## 2018-10-24 DIAGNOSIS — R1312 Dysphagia, oropharyngeal phase: Secondary | ICD-10-CM | POA: Diagnosis not present

## 2018-10-24 DIAGNOSIS — I110 Hypertensive heart disease with heart failure: Secondary | ICD-10-CM | POA: Diagnosis not present

## 2018-10-24 DIAGNOSIS — I69351 Hemiplegia and hemiparesis following cerebral infarction affecting right dominant side: Secondary | ICD-10-CM | POA: Diagnosis not present

## 2018-10-24 DIAGNOSIS — I69320 Aphasia following cerebral infarction: Secondary | ICD-10-CM | POA: Diagnosis not present

## 2018-10-24 DIAGNOSIS — I5020 Unspecified systolic (congestive) heart failure: Secondary | ICD-10-CM | POA: Diagnosis not present

## 2018-10-24 DIAGNOSIS — G40909 Epilepsy, unspecified, not intractable, without status epilepticus: Secondary | ICD-10-CM | POA: Diagnosis not present

## 2018-10-24 DIAGNOSIS — I69391 Dysphagia following cerebral infarction: Secondary | ICD-10-CM | POA: Diagnosis not present

## 2018-10-24 DIAGNOSIS — M5030 Other cervical disc degeneration, unspecified cervical region: Secondary | ICD-10-CM | POA: Diagnosis not present

## 2018-10-27 ENCOUNTER — Telehealth: Payer: Self-pay | Admitting: *Deleted

## 2018-10-27 DIAGNOSIS — I69391 Dysphagia following cerebral infarction: Secondary | ICD-10-CM | POA: Diagnosis not present

## 2018-10-27 DIAGNOSIS — I69351 Hemiplegia and hemiparesis following cerebral infarction affecting right dominant side: Secondary | ICD-10-CM | POA: Diagnosis not present

## 2018-10-27 DIAGNOSIS — R1312 Dysphagia, oropharyngeal phase: Secondary | ICD-10-CM | POA: Diagnosis not present

## 2018-10-27 DIAGNOSIS — I5020 Unspecified systolic (congestive) heart failure: Secondary | ICD-10-CM | POA: Diagnosis not present

## 2018-10-27 DIAGNOSIS — M5030 Other cervical disc degeneration, unspecified cervical region: Secondary | ICD-10-CM | POA: Diagnosis not present

## 2018-10-27 DIAGNOSIS — G40909 Epilepsy, unspecified, not intractable, without status epilepticus: Secondary | ICD-10-CM | POA: Diagnosis not present

## 2018-10-27 DIAGNOSIS — E559 Vitamin D deficiency, unspecified: Secondary | ICD-10-CM | POA: Diagnosis not present

## 2018-10-27 DIAGNOSIS — I110 Hypertensive heart disease with heart failure: Secondary | ICD-10-CM | POA: Diagnosis not present

## 2018-10-27 DIAGNOSIS — I69320 Aphasia following cerebral infarction: Secondary | ICD-10-CM | POA: Diagnosis not present

## 2018-10-27 NOTE — Telephone Encounter (Signed)
[facility] is calling and states they need an orthotics for R wrist/hand and finger pain.   Faxed to [facility] health [phone]

## 2018-10-27 NOTE — Telephone Encounter (Signed)
Verbal given 

## 2018-10-28 DIAGNOSIS — I69391 Dysphagia following cerebral infarction: Secondary | ICD-10-CM | POA: Diagnosis not present

## 2018-10-28 DIAGNOSIS — R1312 Dysphagia, oropharyngeal phase: Secondary | ICD-10-CM | POA: Diagnosis not present

## 2018-10-28 DIAGNOSIS — I5020 Unspecified systolic (congestive) heart failure: Secondary | ICD-10-CM | POA: Diagnosis not present

## 2018-10-28 DIAGNOSIS — M5030 Other cervical disc degeneration, unspecified cervical region: Secondary | ICD-10-CM | POA: Diagnosis not present

## 2018-10-28 DIAGNOSIS — G40909 Epilepsy, unspecified, not intractable, without status epilepticus: Secondary | ICD-10-CM | POA: Diagnosis not present

## 2018-10-28 DIAGNOSIS — I69320 Aphasia following cerebral infarction: Secondary | ICD-10-CM | POA: Diagnosis not present

## 2018-10-28 DIAGNOSIS — I69351 Hemiplegia and hemiparesis following cerebral infarction affecting right dominant side: Secondary | ICD-10-CM | POA: Diagnosis not present

## 2018-10-28 DIAGNOSIS — E559 Vitamin D deficiency, unspecified: Secondary | ICD-10-CM | POA: Diagnosis not present

## 2018-10-28 DIAGNOSIS — I110 Hypertensive heart disease with heart failure: Secondary | ICD-10-CM | POA: Diagnosis not present

## 2018-10-31 DIAGNOSIS — I69391 Dysphagia following cerebral infarction: Secondary | ICD-10-CM | POA: Diagnosis not present

## 2018-10-31 DIAGNOSIS — I69351 Hemiplegia and hemiparesis following cerebral infarction affecting right dominant side: Secondary | ICD-10-CM | POA: Diagnosis not present

## 2018-10-31 DIAGNOSIS — M5030 Other cervical disc degeneration, unspecified cervical region: Secondary | ICD-10-CM | POA: Diagnosis not present

## 2018-10-31 DIAGNOSIS — E559 Vitamin D deficiency, unspecified: Secondary | ICD-10-CM | POA: Diagnosis not present

## 2018-10-31 DIAGNOSIS — I69320 Aphasia following cerebral infarction: Secondary | ICD-10-CM | POA: Diagnosis not present

## 2018-10-31 DIAGNOSIS — I110 Hypertensive heart disease with heart failure: Secondary | ICD-10-CM | POA: Diagnosis not present

## 2018-10-31 DIAGNOSIS — G40909 Epilepsy, unspecified, not intractable, without status epilepticus: Secondary | ICD-10-CM | POA: Diagnosis not present

## 2018-10-31 DIAGNOSIS — R1312 Dysphagia, oropharyngeal phase: Secondary | ICD-10-CM | POA: Diagnosis not present

## 2018-10-31 DIAGNOSIS — I5020 Unspecified systolic (congestive) heart failure: Secondary | ICD-10-CM | POA: Diagnosis not present

## 2018-11-03 DIAGNOSIS — I69351 Hemiplegia and hemiparesis following cerebral infarction affecting right dominant side: Secondary | ICD-10-CM | POA: Diagnosis not present

## 2018-11-03 DIAGNOSIS — E559 Vitamin D deficiency, unspecified: Secondary | ICD-10-CM | POA: Diagnosis not present

## 2018-11-03 DIAGNOSIS — G40909 Epilepsy, unspecified, not intractable, without status epilepticus: Secondary | ICD-10-CM | POA: Diagnosis not present

## 2018-11-03 DIAGNOSIS — I5020 Unspecified systolic (congestive) heart failure: Secondary | ICD-10-CM | POA: Diagnosis not present

## 2018-11-03 DIAGNOSIS — R1312 Dysphagia, oropharyngeal phase: Secondary | ICD-10-CM | POA: Diagnosis not present

## 2018-11-03 DIAGNOSIS — I110 Hypertensive heart disease with heart failure: Secondary | ICD-10-CM | POA: Diagnosis not present

## 2018-11-03 DIAGNOSIS — M5030 Other cervical disc degeneration, unspecified cervical region: Secondary | ICD-10-CM | POA: Diagnosis not present

## 2018-11-03 DIAGNOSIS — I69320 Aphasia following cerebral infarction: Secondary | ICD-10-CM | POA: Diagnosis not present

## 2018-11-03 DIAGNOSIS — I69391 Dysphagia following cerebral infarction: Secondary | ICD-10-CM | POA: Diagnosis not present

## 2018-11-06 DIAGNOSIS — I110 Hypertensive heart disease with heart failure: Secondary | ICD-10-CM | POA: Diagnosis not present

## 2018-11-06 DIAGNOSIS — I69351 Hemiplegia and hemiparesis following cerebral infarction affecting right dominant side: Secondary | ICD-10-CM | POA: Diagnosis not present

## 2018-11-06 DIAGNOSIS — M5030 Other cervical disc degeneration, unspecified cervical region: Secondary | ICD-10-CM | POA: Diagnosis not present

## 2018-11-06 DIAGNOSIS — I5020 Unspecified systolic (congestive) heart failure: Secondary | ICD-10-CM | POA: Diagnosis not present

## 2018-11-06 DIAGNOSIS — E559 Vitamin D deficiency, unspecified: Secondary | ICD-10-CM | POA: Diagnosis not present

## 2018-11-06 DIAGNOSIS — I69320 Aphasia following cerebral infarction: Secondary | ICD-10-CM | POA: Diagnosis not present

## 2018-11-06 DIAGNOSIS — G40909 Epilepsy, unspecified, not intractable, without status epilepticus: Secondary | ICD-10-CM | POA: Diagnosis not present

## 2018-11-06 DIAGNOSIS — I69391 Dysphagia following cerebral infarction: Secondary | ICD-10-CM | POA: Diagnosis not present

## 2018-11-06 DIAGNOSIS — R1312 Dysphagia, oropharyngeal phase: Secondary | ICD-10-CM | POA: Diagnosis not present

## 2018-11-07 DIAGNOSIS — I69391 Dysphagia following cerebral infarction: Secondary | ICD-10-CM | POA: Diagnosis not present

## 2018-11-07 DIAGNOSIS — I69320 Aphasia following cerebral infarction: Secondary | ICD-10-CM | POA: Diagnosis not present

## 2018-11-07 DIAGNOSIS — M5030 Other cervical disc degeneration, unspecified cervical region: Secondary | ICD-10-CM | POA: Diagnosis not present

## 2018-11-07 DIAGNOSIS — I5020 Unspecified systolic (congestive) heart failure: Secondary | ICD-10-CM | POA: Diagnosis not present

## 2018-11-07 DIAGNOSIS — E559 Vitamin D deficiency, unspecified: Secondary | ICD-10-CM | POA: Diagnosis not present

## 2018-11-07 DIAGNOSIS — R1312 Dysphagia, oropharyngeal phase: Secondary | ICD-10-CM | POA: Diagnosis not present

## 2018-11-07 DIAGNOSIS — I110 Hypertensive heart disease with heart failure: Secondary | ICD-10-CM | POA: Diagnosis not present

## 2018-11-07 DIAGNOSIS — I69351 Hemiplegia and hemiparesis following cerebral infarction affecting right dominant side: Secondary | ICD-10-CM | POA: Diagnosis not present

## 2018-11-07 DIAGNOSIS — G40909 Epilepsy, unspecified, not intractable, without status epilepticus: Secondary | ICD-10-CM | POA: Diagnosis not present

## 2018-11-10 DIAGNOSIS — I69351 Hemiplegia and hemiparesis following cerebral infarction affecting right dominant side: Secondary | ICD-10-CM | POA: Diagnosis not present

## 2018-11-10 DIAGNOSIS — R1312 Dysphagia, oropharyngeal phase: Secondary | ICD-10-CM | POA: Diagnosis not present

## 2018-11-10 DIAGNOSIS — M5030 Other cervical disc degeneration, unspecified cervical region: Secondary | ICD-10-CM | POA: Diagnosis not present

## 2018-11-10 DIAGNOSIS — I69320 Aphasia following cerebral infarction: Secondary | ICD-10-CM | POA: Diagnosis not present

## 2018-11-10 DIAGNOSIS — G40909 Epilepsy, unspecified, not intractable, without status epilepticus: Secondary | ICD-10-CM | POA: Diagnosis not present

## 2018-11-10 DIAGNOSIS — E559 Vitamin D deficiency, unspecified: Secondary | ICD-10-CM | POA: Diagnosis not present

## 2018-11-10 DIAGNOSIS — I5020 Unspecified systolic (congestive) heart failure: Secondary | ICD-10-CM | POA: Diagnosis not present

## 2018-11-10 DIAGNOSIS — I110 Hypertensive heart disease with heart failure: Secondary | ICD-10-CM | POA: Diagnosis not present

## 2018-11-10 DIAGNOSIS — I69391 Dysphagia following cerebral infarction: Secondary | ICD-10-CM | POA: Diagnosis not present

## 2018-11-11 ENCOUNTER — Telehealth: Payer: Self-pay | Admitting: Family Medicine

## 2018-11-11 DIAGNOSIS — I69320 Aphasia following cerebral infarction: Secondary | ICD-10-CM | POA: Diagnosis not present

## 2018-11-11 DIAGNOSIS — R1312 Dysphagia, oropharyngeal phase: Secondary | ICD-10-CM | POA: Diagnosis not present

## 2018-11-11 DIAGNOSIS — E559 Vitamin D deficiency, unspecified: Secondary | ICD-10-CM | POA: Diagnosis not present

## 2018-11-11 DIAGNOSIS — I110 Hypertensive heart disease with heart failure: Secondary | ICD-10-CM | POA: Diagnosis not present

## 2018-11-11 DIAGNOSIS — G40909 Epilepsy, unspecified, not intractable, without status epilepticus: Secondary | ICD-10-CM | POA: Diagnosis not present

## 2018-11-11 DIAGNOSIS — I69391 Dysphagia following cerebral infarction: Secondary | ICD-10-CM | POA: Diagnosis not present

## 2018-11-11 DIAGNOSIS — M5030 Other cervical disc degeneration, unspecified cervical region: Secondary | ICD-10-CM | POA: Diagnosis not present

## 2018-11-11 DIAGNOSIS — I69351 Hemiplegia and hemiparesis following cerebral infarction affecting right dominant side: Secondary | ICD-10-CM | POA: Diagnosis not present

## 2018-11-11 DIAGNOSIS — I5020 Unspecified systolic (congestive) heart failure: Secondary | ICD-10-CM | POA: Diagnosis not present

## 2018-11-11 NOTE — Telephone Encounter (Signed)
[name] from [facility] would like pt's orders extended for 1 more week. Please advise at [phone]

## 2018-11-12 NOTE — Telephone Encounter (Signed)
Verbal given 

## 2018-11-14 ENCOUNTER — Other Ambulatory Visit: Payer: Self-pay

## 2018-11-14 MED ORDER — SACUBITRIL-VALSARTAN 24-26 MG PO TABS: 1.0000 | ORAL_TABLET | Freq: Two times a day (BID) | ORAL | 5 refills | Status: DC

## 2018-11-17 ENCOUNTER — Other Ambulatory Visit: Payer: Self-pay

## 2018-11-18 DIAGNOSIS — R1312 Dysphagia, oropharyngeal phase: Secondary | ICD-10-CM | POA: Diagnosis not present

## 2018-11-18 DIAGNOSIS — I110 Hypertensive heart disease with heart failure: Secondary | ICD-10-CM | POA: Diagnosis not present

## 2018-11-18 DIAGNOSIS — G40909 Epilepsy, unspecified, not intractable, without status epilepticus: Secondary | ICD-10-CM | POA: Diagnosis not present

## 2018-11-18 DIAGNOSIS — I69351 Hemiplegia and hemiparesis following cerebral infarction affecting right dominant side: Secondary | ICD-10-CM | POA: Diagnosis not present

## 2018-11-18 DIAGNOSIS — I69391 Dysphagia following cerebral infarction: Secondary | ICD-10-CM | POA: Diagnosis not present

## 2018-11-18 DIAGNOSIS — I69320 Aphasia following cerebral infarction: Secondary | ICD-10-CM | POA: Diagnosis not present

## 2018-11-18 DIAGNOSIS — M5030 Other cervical disc degeneration, unspecified cervical region: Secondary | ICD-10-CM | POA: Diagnosis not present

## 2018-11-18 DIAGNOSIS — E559 Vitamin D deficiency, unspecified: Secondary | ICD-10-CM | POA: Diagnosis not present

## 2018-11-18 DIAGNOSIS — I5020 Unspecified systolic (congestive) heart failure: Secondary | ICD-10-CM | POA: Diagnosis not present

## 2018-11-20 DIAGNOSIS — R1312 Dysphagia, oropharyngeal phase: Secondary | ICD-10-CM | POA: Diagnosis not present

## 2018-11-20 DIAGNOSIS — G40909 Epilepsy, unspecified, not intractable, without status epilepticus: Secondary | ICD-10-CM | POA: Diagnosis not present

## 2018-11-20 DIAGNOSIS — I69391 Dysphagia following cerebral infarction: Secondary | ICD-10-CM | POA: Diagnosis not present

## 2018-11-20 DIAGNOSIS — I5020 Unspecified systolic (congestive) heart failure: Secondary | ICD-10-CM | POA: Diagnosis not present

## 2018-11-20 DIAGNOSIS — M5030 Other cervical disc degeneration, unspecified cervical region: Secondary | ICD-10-CM | POA: Diagnosis not present

## 2018-11-20 DIAGNOSIS — E559 Vitamin D deficiency, unspecified: Secondary | ICD-10-CM | POA: Diagnosis not present

## 2018-11-20 DIAGNOSIS — I69351 Hemiplegia and hemiparesis following cerebral infarction affecting right dominant side: Secondary | ICD-10-CM | POA: Diagnosis not present

## 2018-11-20 DIAGNOSIS — I69320 Aphasia following cerebral infarction: Secondary | ICD-10-CM | POA: Diagnosis not present

## 2018-11-20 DIAGNOSIS — I110 Hypertensive heart disease with heart failure: Secondary | ICD-10-CM | POA: Diagnosis not present

## 2018-11-21 ENCOUNTER — Other Ambulatory Visit: Payer: Self-pay

## 2018-11-21 ENCOUNTER — Telehealth: Payer: Self-pay | Admitting: Family Medicine

## 2018-11-21 MED ORDER — SACUBITRIL-VALSARTAN 24-26 MG PO TABS: 1.0000 | ORAL_TABLET | Freq: Two times a day (BID) | ORAL | 0 refills | Status: DC

## 2018-11-21 NOTE — Telephone Encounter (Signed)
Please send Delene Loll to [facility].

## 2018-11-21 NOTE — Telephone Encounter (Signed)
Pt is being discharged from OT. Has met all of her goals. Good range in arm  Therapist is requesting Right wrist hand finger art splint prescription and can fax to [phone] attn janet  If any questions call -[place] [phone]

## 2018-11-26 DIAGNOSIS — I639 Cerebral infarction, unspecified: Secondary | ICD-10-CM | POA: Diagnosis not present

## 2018-11-26 DIAGNOSIS — Z7901 Long term (current) use of anticoagulants: Secondary | ICD-10-CM | POA: Diagnosis not present

## 2018-11-26 NOTE — Telephone Encounter (Signed)
Please advise on message below and send rx if appropriate

## 2018-11-27 ENCOUNTER — Telehealth: Payer: Self-pay | Admitting: *Deleted

## 2018-11-27 NOTE — Telephone Encounter (Signed)
Sent and faxed---Rx-- right wrist hand finger splint to [phone] att. Marcie Bal

## 2018-11-27 NOTE — Telephone Encounter (Signed)
done

## 2018-11-28 DIAGNOSIS — I69320 Aphasia following cerebral infarction: Secondary | ICD-10-CM | POA: Diagnosis not present

## 2018-11-28 DIAGNOSIS — I110 Hypertensive heart disease with heart failure: Secondary | ICD-10-CM | POA: Diagnosis not present

## 2018-11-28 DIAGNOSIS — I69391 Dysphagia following cerebral infarction: Secondary | ICD-10-CM | POA: Diagnosis not present

## 2018-11-28 DIAGNOSIS — I69351 Hemiplegia and hemiparesis following cerebral infarction affecting right dominant side: Secondary | ICD-10-CM | POA: Diagnosis not present

## 2018-11-28 DIAGNOSIS — E559 Vitamin D deficiency, unspecified: Secondary | ICD-10-CM | POA: Diagnosis not present

## 2018-11-28 DIAGNOSIS — G40909 Epilepsy, unspecified, not intractable, without status epilepticus: Secondary | ICD-10-CM | POA: Diagnosis not present

## 2018-11-28 DIAGNOSIS — M5030 Other cervical disc degeneration, unspecified cervical region: Secondary | ICD-10-CM | POA: Diagnosis not present

## 2018-11-28 DIAGNOSIS — R1312 Dysphagia, oropharyngeal phase: Secondary | ICD-10-CM | POA: Diagnosis not present

## 2018-11-28 DIAGNOSIS — I5020 Unspecified systolic (congestive) heart failure: Secondary | ICD-10-CM | POA: Diagnosis not present

## 2018-12-01 ENCOUNTER — Other Ambulatory Visit: Payer: Self-pay

## 2018-12-01 ENCOUNTER — Encounter: Payer: Self-pay | Admitting: Cardiovascular Disease

## 2018-12-01 ENCOUNTER — Ambulatory Visit (INDEPENDENT_AMBULATORY_CARE_PROVIDER_SITE_OTHER): Payer: Medicare HMO | Admitting: Cardiovascular Disease

## 2018-12-01 VITALS — BP 122/58 | HR 67 | Temp 97.0°F | Ht 67.0 in | Wt 139.0 lb

## 2018-12-01 DIAGNOSIS — I63139 Cerebral infarction due to embolism of unspecified carotid artery: Secondary | ICD-10-CM

## 2018-12-01 DIAGNOSIS — I10 Essential (primary) hypertension: Secondary | ICD-10-CM | POA: Diagnosis not present

## 2018-12-01 DIAGNOSIS — R4701 Aphasia: Secondary | ICD-10-CM

## 2018-12-01 DIAGNOSIS — I5042 Chronic combined systolic (congestive) and diastolic (congestive) heart failure: Secondary | ICD-10-CM | POA: Diagnosis not present

## 2018-12-01 NOTE — Patient Instructions (Signed)
Medication Instructions:  Your physician recommends that you continue on your current medications as directed. Please refer to the Current Medication list given to you today.  *If you need a refill on your cardiac medications before your next appointment, please call your pharmacy*  Lab Work: NONE  Testing/Procedures:  NONE   Follow-Up: At [facility], you and your health needs are our priority.  As part of our continuing mission to provide you with exceptional heart care, we have created designated Provider Care Teams.  These Care Teams include your primary Cardiologist (physician) and Advanced Practice Providers (APPs -  Physician Assistants and Nurse Practitioners) who all work together to provide you with the care you need, when you need it.  Your next appointment:   6 months  The format for your next appointment:   In Person  Provider:   You may see Skeet Latch, MD B  or one of the following Advanced Practice Providers on your designated Care Team:    Kerin Ransom, PA-C  [place], [place]  Coletta Memos, [place]

## 2018-12-01 NOTE — Progress Notes (Signed)
Cardiology Office Note   Date:  12/01/2018   ID:  [name], DOB 1950-04-09, MRN [id number]  PCP:  Rutherford Guys, MD  Cardiologist:   Skeet Latch, MD   Chief Complaint  Patient presents with  . Follow-up    6 weeks.    History of Present Illness: [name] is a 68 y.o. female with Sickle cell anemia, hypertension, hyperlipidemia, embolic stroke, expressive aphasia, and seizures who presents for follow up after hospitalization for acute on chronic systolic and diastolic heart failure.  She was hospitalized 08/2018 for status epilepticus requiring intubation in the setting of being out of her cardiac medication.  Hs troponin was elevated to 1905.  She had an echo that revealed LVEF 35-40% with moderate LVH and grade 1 diastolic dysfunction.  There was moderate hypokinesis of the inferior and lateral myocardium.  There was also aortic valve sclerosis without stenosis.  She reported having chest pain during the hospitalization, but by the time cardiology evaluated her she didn't remember the event.  She had a Lexiscan Myoview 09/20/18 that showed LVEF 34% with a large, severe mostly fixed defect in the inferior, inferoapical, and inferolateral myocardium.  There was little reversible ischemia.    At her last appointment it was unclear whether [name] was taking losartan.  This medication was switched to [facility].  Her husband reports that she has been doing well.  He states that she has been on digoxin for over 10 years.  She complains of swelling on her right side ever since her stroke but denies any left-sided edema.  She denies orthopnea or PND.  He doesn't recall every being told that she has atrial fibrillation.  She also denies chest pain.  She has been getting physical therapy and occupational therapy and home.  He mentions that she is supposed to have several teeth extracted but they are unable to afford this at this time.   Past Medical History:  Diagnosis Date  .  Adenomatous polyp of colon 09/2012   repeat colonoscopy in 5 years by Dr. Sharlett Iles  . CHF (congestive heart failure) (HCC)    EF 15-20% as of 05/28/11 (Dr Legrand [name] D/C summary)  . CVA (cerebral infarction)   . Hemiparesis ([place])   . Hyperlipidemia   . Hypertension   . Seizures ([place])   . Sickle cell anemia (HCC)   . Stroke ([place])   . Vitamin D deficiency 2010    Past Surgical History:  Procedure Laterality Date  . ABDOMINAL HYSTERECTOMY    . FRACTURE SURGERY    . HIP SURGERY    . TUBAL LIGATION       Current Outpatient Medications  Medication Sig Dispense Refill  . alendronate (FOSAMAX) 70 MG tablet TAKE 1 TABLET EVERY 7 DAYS WITH FULL GLASS OF WATER ON AN EMPTY STOMACH 12 tablet 3  . atorvastatin (LIPITOR) 80 MG tablet Take 1 tablet (80 mg total) by mouth at bedtime. 90 tablet 1  . baclofen (LIORESAL) 20 MG tablet TAKE 1 TABLET THREE TIMES DAILY (MAY TAKE AN ADDITIONAL TABLET MID-DAY OR BEFORE BED AS NEEDED FOR MUSCLE SPASMS) 120 tablet 2  . capsicum oleoresin (TRIXAICIN) 0.025 % cream Apply 1 application topically daily as needed (arthiritis pain).    . carvedilol (COREG) 6.25 MG tablet Take 1 tablet (6.25 mg total) by mouth 2 (two) times daily. 180 tablet 0  . digoxin (LANOXIN) 0.125 MG tablet TAKE 1 TABLET (125 MCG TOTAL) BY MOUTH DAILY. 90 tablet 0  .  furosemide (LASIX) 40 MG tablet Take 1 tablet (40 mg total) by mouth daily. 30 tablet 0  . lacosamide 100 MG TABS Take 1 tablet (100 mg total) by mouth 2 (two) times daily. 60 tablet 0  . nystatin (NYSTATIN) powder Apply topically daily. Apply under breasts once daily after bath    . phenytoin (DILANTIN) 125 MG/5ML suspension Phenytoin Morning Evening 8/26-8/27 120mg   60mg  8/28-8/29 60mg              60mg  8/30-8/31 60mg              30mg  9/1-9/2 30mg  30mg  9/3-9/4 30mg  STOP 9/5 STOP STOP 237 mL 0  . polyethylene glycol (MIRALAX / GLYCOLAX) packet Take 17 g by mouth daily as needed for moderate constipation.    .  sacubitril-valsartan (ENTRESTO) 24-26 MG Take 1 tablet by mouth 2 (two) times daily. 180 tablet 0  . triamcinolone cream (KENALOG) 0.1 % Apply 1 application topically 2 (two) times daily. 80 g 0  . Vitamin D, Ergocalciferol, (DRISDOL) 1.25 MG (50000 UT) CAPS capsule TAKE 1 CAPSULE EVERY 7 DAYS 12 capsule 0  . warfarin (COUMADIN) 5 MG tablet TAKE AS DIRECTED BY COUMADIN CLINIC. NEED OFFICE VISIT WITH FASTING LABS FOR REFILLS (Patient taking differently: Take 5 mg by mouth one time only at 6 PM. Take 1 tablet (5mg ) po daily at [facility].) 90 tablet 0  . levETIRAcetam (KEPPRA) 750 MG tablet Take 2 tablets (1,500 mg total) by mouth 2 (two) times daily. 120 tablet 0   No current facility-administered medications for this visit.     Allergies:   Lexiscan [regadenoson]    Social History:  The patient  reports that she quit smoking about 11 years ago. Her smoking use included cigarettes. She has never used smokeless tobacco. She reports that she does not drink alcohol or use drugs.   Family History:  The patient's family history includes Colon cancer in her maternal grandfather; Diabetes in her daughter.    ROS:  Please see the history of present illness.   Otherwise, review of systems are positive for none.   All other systems are reviewed and negative.    PHYSICAL EXAM: VS:  BP (!) 122/58 (BP Location: Left Arm, Patient Position: Sitting, Cuff Size: Normal)   Pulse 67   Temp (!) 97 F (36.1 C)   Ht 5\' 7"  (1.702 m)   Wt 139 lb (63 kg)   BMI 21.77 kg/m  , BMI Body mass index is 21.77 kg/m. GENERAL:  Well appearing HEENT:  Pupils equal round and reactive, fundi not visualized, oral mucosa unremarkable NECK:  No jugular venous distention, waveform within normal limits, carotid upstroke brisk and symmetric, no bruits, no thyromegaly LYMPHATICS:  No cervical adenopathy LUNGS:  Clear to auscultation bilaterally HEART:  RRR.  PMI not displaced or sustained,S1 and S2 within normal limits, no S3, no S4,  no clicks, no rubs, no murmurs ABD:  Flat, positive bowel sounds normal in frequency in pitch, no bruits, no rebound, no guarding, no midline pulsatile mass, no hepatomegaly, no splenomegaly EXT:  2 plus pulses throughout, no edema, no cyanosis no clubbing SKIN:  No rashes no nodules NEURO:  R hemiparesis.   PSYCH:  Unable to assess.  Expressive aphasia.   EKG:  EKG is not ordered today. The ekg ordered 10/20/18 demonstrates sinus bradycardia.  Rate 55 bpm.  RBBB.  LAFB.  Inferolateral TWI.  Echo 09/19/18: IMPRESSIONS   1. The left ventricle has moderately reduced systolic function, with an  ejection fraction of 35-40%. The cavity size was normal. There is moderately increased left ventricular wall thickness. Left ventricular diastolic Doppler parameters are consistent  with impaired relaxation. Elevated left atrial and left ventricular end-diastolic pressures The E/e' is >20.  2. Moderate hypokinesis of the left ventricular, entire inferior wall and lateral wall.  3. The right ventricle has normal systolic function. The cavity was normal. There is no increase in right ventricular wall thickness.  4. Left atrial size was mildly dilated.  5. The mitral valve is grossly normal.  6. The tricuspid valve is grossly normal.  7. The aortic valve is tricuspid. Moderate sclerosis of the aortic valve. Aortic valve regurgitation is moderate by color flow Doppler. No stenosis of the aortic valve.  8. The aorta is normal unless otherwise noted.  Lexiscan Myoview 09/20/18:   Downsloping ST segment depression ST segment depression of 0.5 mm was noted during stress in the II, V4, V5 and V6 leads, and returning to baseline after 1-5 minutes of recovery.  Defect 1: There is a large defect of severe severity present in the basal inferior, basal inferolateral, mid inferior, mid inferolateral, apical septal, apical inferior, apical lateral and apex location.  Findings consistent with prior myocardial  infarction with peri-infarct ischemia.  This is a high risk study.  Nuclear stress EF: 34%.   Large size, severe severity mostly fixed (SDS 3) inferior, inferoapical and inferolateral perfusion defect suggestive of scar without significant peri-infarct ischemia. LVEF 34% with inferior, inferoapical and inferolateral akinesis. This is a high risk study due to low LVEF, however, no significant reversible ischemia is noted.   Recent Labs: 09/17/2018: Magnesium 1.8 10/10/2018: ALT 20; BUN 15; Creatinine, Ser 0.82; Hemoglobin 14.2; Platelets 308; Potassium 3.9; Sodium 144    Lipid Panel    Component Value Date/Time   CHOL 134 06/25/2018 1040   TRIG 82 09/14/2018 0732   HDL 42 06/25/2018 1040   CHOLHDL 3.2 06/25/2018 1040   CHOLHDL 3.5 09/30/2014 0852   VLDL 19 09/30/2014 0852   LDLCALC 71 06/25/2018 1040      Wt Readings from Last 3 Encounters:  12/01/18 139 lb (63 kg)  10/20/18 137 lb 12.8 oz (62.5 kg)  09/19/18 145 lb 15.1 oz (66.2 kg)      ASSESSMENT AND PLAN:  # Chronic systolic and diastolic heart failure: LVEF 35-40% during hospitalization.  This was up from 13% previously.  Stress test was concerning for infarct with very mild peri-infarct ischemia.  She currently denies chest pain.  She would not be a good candidate for cardiac catheterization.  We will continue medical management with carvedilol. Entresto, and digoxin.  It is not entirely clear to me that she needs to remain on digoxin.  Levels were within a therapeutic range when checked last months.  Given that she has been on it for 10 years and has been stable we will not make any changes at this time.  Consider adding spironolactone in the future if blood pressure allows.   # Embolic stroke:  Residual right hemiparesis and expressive aphasia.  Continue warfarin.  No known history of atrial fibrillation.  # Hypertension: BP is well have been controlled on carvedilol and Entresto.  # Hyperlipidemia: Continue  atorvastatin.   Current medicines are reviewed at length with the patient today.  The patient does not have concerns regarding medicines.  The following changes have been made:  Switch losartan to [name]  Labs/ tests ordered today include:  No orders of the defined types  were placed in this encounter.    Disposition:   FU with [name] C. Oval [name], MD, [name] in 6 months.    Signed, Lisette Mancebo C. Oval [name], MD, [facility]  12/01/2018 12:01 PM    [place]

## 2018-12-03 ENCOUNTER — Telehealth: Payer: Self-pay | Admitting: *Deleted

## 2018-12-03 ENCOUNTER — Ambulatory Visit: Payer: Medicare HMO | Admitting: Diagnostic Neuroimaging

## 2018-12-03 NOTE — Telephone Encounter (Signed)
Patient's husband called this morning and canceled her new patient appointment today. He stated reasons as weather and that she is in wheelchair. He rescheduled her.

## 2018-12-05 ENCOUNTER — Telehealth: Payer: Self-pay

## 2018-12-05 NOTE — Telephone Encounter (Signed)
rx faxed to [phone] for right hand finger splint. Copy of rx will be at the nurse station for 30 days

## 2018-12-06 DIAGNOSIS — I69320 Aphasia following cerebral infarction: Secondary | ICD-10-CM | POA: Diagnosis not present

## 2018-12-06 DIAGNOSIS — I69391 Dysphagia following cerebral infarction: Secondary | ICD-10-CM | POA: Diagnosis not present

## 2018-12-06 DIAGNOSIS — M5030 Other cervical disc degeneration, unspecified cervical region: Secondary | ICD-10-CM | POA: Diagnosis not present

## 2018-12-06 DIAGNOSIS — I5020 Unspecified systolic (congestive) heart failure: Secondary | ICD-10-CM | POA: Diagnosis not present

## 2018-12-06 DIAGNOSIS — E559 Vitamin D deficiency, unspecified: Secondary | ICD-10-CM | POA: Diagnosis not present

## 2018-12-06 DIAGNOSIS — G40909 Epilepsy, unspecified, not intractable, without status epilepticus: Secondary | ICD-10-CM | POA: Diagnosis not present

## 2018-12-06 DIAGNOSIS — R1312 Dysphagia, oropharyngeal phase: Secondary | ICD-10-CM | POA: Diagnosis not present

## 2018-12-06 DIAGNOSIS — I110 Hypertensive heart disease with heart failure: Secondary | ICD-10-CM | POA: Diagnosis not present

## 2018-12-06 DIAGNOSIS — I69351 Hemiplegia and hemiparesis following cerebral infarction affecting right dominant side: Secondary | ICD-10-CM | POA: Diagnosis not present

## 2018-12-09 DIAGNOSIS — E559 Vitamin D deficiency, unspecified: Secondary | ICD-10-CM | POA: Diagnosis not present

## 2018-12-09 DIAGNOSIS — M5030 Other cervical disc degeneration, unspecified cervical region: Secondary | ICD-10-CM | POA: Diagnosis not present

## 2018-12-09 DIAGNOSIS — G40909 Epilepsy, unspecified, not intractable, without status epilepticus: Secondary | ICD-10-CM | POA: Diagnosis not present

## 2018-12-09 DIAGNOSIS — I69391 Dysphagia following cerebral infarction: Secondary | ICD-10-CM | POA: Diagnosis not present

## 2018-12-09 DIAGNOSIS — I69320 Aphasia following cerebral infarction: Secondary | ICD-10-CM | POA: Diagnosis not present

## 2018-12-09 DIAGNOSIS — I110 Hypertensive heart disease with heart failure: Secondary | ICD-10-CM | POA: Diagnosis not present

## 2018-12-09 DIAGNOSIS — R1312 Dysphagia, oropharyngeal phase: Secondary | ICD-10-CM | POA: Diagnosis not present

## 2018-12-09 DIAGNOSIS — I69351 Hemiplegia and hemiparesis following cerebral infarction affecting right dominant side: Secondary | ICD-10-CM | POA: Diagnosis not present

## 2018-12-09 DIAGNOSIS — I5020 Unspecified systolic (congestive) heart failure: Secondary | ICD-10-CM | POA: Diagnosis not present

## 2018-12-11 ENCOUNTER — Other Ambulatory Visit: Payer: Self-pay | Admitting: Family Medicine

## 2018-12-11 DIAGNOSIS — M816 Localized osteoporosis [Lequesne]: Secondary | ICD-10-CM

## 2018-12-11 NOTE — Telephone Encounter (Signed)
Forwarding medication refill request to the clinical pool for review. 

## 2018-12-22 ENCOUNTER — Telehealth: Payer: Self-pay

## 2018-12-22 ENCOUNTER — Telehealth: Payer: Self-pay | Admitting: Family Medicine

## 2018-12-22 NOTE — Telephone Encounter (Signed)
nystatin (NYSTATIN) powder [id number]

## 2018-12-22 NOTE — Telephone Encounter (Signed)
Pt information was faxed bk to [place] for CMS orders to be signed by pcp at [phone]

## 2018-12-24 ENCOUNTER — Other Ambulatory Visit: Payer: Self-pay

## 2018-12-24 ENCOUNTER — Encounter: Payer: Self-pay | Admitting: Diagnostic Neuroimaging

## 2018-12-24 ENCOUNTER — Ambulatory Visit: Payer: Medicare HMO | Admitting: Diagnostic Neuroimaging

## 2018-12-24 VITALS — BP 130/63 | HR 65 | Temp 97.5°F | Ht 67.0 in | Wt 128.0 lb

## 2018-12-24 DIAGNOSIS — I63412 Cerebral infarction due to embolism of left middle cerebral artery: Secondary | ICD-10-CM | POA: Diagnosis not present

## 2018-12-24 DIAGNOSIS — G40909 Epilepsy, unspecified, not intractable, without status epilepticus: Secondary | ICD-10-CM

## 2018-12-24 DIAGNOSIS — I69959 Hemiplegia and hemiparesis following unspecified cerebrovascular disease affecting unspecified side: Secondary | ICD-10-CM

## 2018-12-24 MED ORDER — LEVETIRACETAM 750 MG PO TABS: 1500.0000 mg | ORAL_TABLET | Freq: Two times a day (BID) | ORAL | 4 refills | Status: DC

## 2018-12-24 MED ORDER — LACOSAMIDE 100 MG PO TABS: 100.0000 mg | ORAL_TABLET | Freq: Two times a day (BID) | ORAL | 4 refills | Status: DC

## 2018-12-24 NOTE — Patient Instructions (Signed)
-   continue levetiracetam 1500mg  twice a day   - continue vimpat (lacosamide) 100mg  twice a day

## 2018-12-24 NOTE — Progress Notes (Signed)
[name]  PATIENT: [name] DOB: 02-13-1950  REFERRING CLINICIAN: Pamella Pert, I HISTORY FROM: patient husband REASON FOR VISIT: new consult    HISTORICAL  CHIEF COMPLAINT:  Chief Complaint  Patient presents with  . Seizures    rm 7, New Pt, husband- Parks Neptune "had seizure a few months ago, hadn't had one since 2009, but she ran out of medicine, went to ICU then rehab"    HISTORY OF PRESENT ILLNESS:   68 year old female here for evaluation of seizure.  History of left hemisphere stroke in 2009 with resultant right-sided hemiparesis.  Patient also had seizure disorder started around 2009.  Patient was doing well until August 2020 when patient had run out of antiseizure medications and had breakthrough seizures, brought to the hospital and treated.  Patient had multiple clinical and electrographic seizures in the hospital.  Patient was maintained on levetiracetam and Vimpat and dilantin; then Dilantin was tapered off at discharge.   Since then patient is stable.  Tolerating medications.   REVIEW OF SYSTEMS: Full 14 system review of systems performed and negative with exception of: As per HPI.  ALLERGIES: Allergies  Allergen Reactions  . Lexiscan [Regadenoson] Other (See Comments)    Perioral tingling and throat tightness    HOME MEDICATIONS: Outpatient Medications Prior to Visit  Medication Sig Dispense Refill  . alendronate (FOSAMAX) 70 MG tablet TAKE 1 TABLET EVERY 7 DAYS WITH FULL GLASS OF WATER ON AN EMPTY STOMACH 12 tablet 3  . atorvastatin (LIPITOR) 80 MG tablet Take 1 tablet (80 mg total) by mouth at bedtime. 90 tablet 1  . baclofen (LIORESAL) 20 MG tablet TAKE 1 TABLET THREE TIMES DAILY (MAY TAKE AN ADDITIONAL TABLET MID-DAY OR BEFORE BED AS NEEDED FOR MUSCLE SPASMS) 120 tablet 2  . carvedilol (COREG) 6.25 MG tablet TAKE 1 TABLET TWICE DAILY 180 tablet 0  . digoxin (LANOXIN) 0.125 MG tablet TAKE 1 TABLET (125 MCG TOTAL) BY MOUTH DAILY. 90  tablet 0  . lacosamide 100 MG TABS Take 1 tablet (100 mg total) by mouth 2 (two) times daily. 60 tablet 0  . levETIRAcetam (KEPPRA) 750 MG tablet Take 2 tablets (1,500 mg total) by mouth 2 (two) times daily. 120 tablet 0  . nystatin (NYSTATIN) powder Apply topically daily. Apply under breasts once daily after bath    . polyethylene glycol (MIRALAX / GLYCOLAX) packet Take 17 g by mouth daily as needed for moderate constipation.    . sacubitril-valsartan (ENTRESTO) 24-26 MG Take 1 tablet by mouth 2 (two) times daily. 180 tablet 0  . Vitamin D, Ergocalciferol, (DRISDOL) 1.25 MG (50000 UT) CAPS capsule TAKE 1 CAPSULE EVERY 7 DAYS 12 capsule 0  . warfarin (COUMADIN) 5 MG tablet TAKE AS DIRECTED BY COUMADIN CLINIC. NEED OFFICE VISIT WITH FASTING LABS FOR REFILLS (Patient taking differently: Take 5 mg by mouth one time only at 6 PM. Take 1 tablet (5mg ) po daily at [facility].) 90 tablet 0  . capsicum oleoresin (TRIXAICIN) 0.025 % cream Apply 1 application topically daily as needed (arthiritis pain).    . furosemide (LASIX) 40 MG tablet Take 1 tablet (40 mg total) by mouth daily. (Patient not taking: Reported on 12/24/2018) 30 tablet 0  . triamcinolone cream (KENALOG) 0.1 % Apply 1 application topically 2 (two) times daily. (Patient not taking: Reported on 12/24/2018) 80 g 0  . phenytoin (DILANTIN) 125 MG/5ML suspension Phenytoin Morning Evening 8/26-8/27 120mg   60mg  8/28-8/29 60mg   60mg  8/30-8/31 60mg              30mg  9/1-9/2 30mg  30mg  9/3-9/4 30mg  STOP 9/5 STOP STOP 237 mL 0   No facility-administered medications prior to visit.     PAST MEDICAL HISTORY: Past Medical History:  Diagnosis Date  . Adenomatous polyp of colon 09/2012   repeat colonoscopy in 5 years by Dr. Sharlett Iles  . CHF (congestive heart failure) (HCC)    EF 15-20% as of 05/28/11 (Dr Legrand [facility] summary)  . CVA (cerebral infarction) 12/2007  . Hemiparesis ([place])   . Hyperlipidemia   . Hypertension   . Seizures  ([place])   . Sickle cell anemia (HCC)   . Stroke ([place])   . Vitamin D deficiency 2010    PAST SURGICAL HISTORY: Past Surgical History:  Procedure Laterality Date  . ABDOMINAL HYSTERECTOMY    . FRACTURE SURGERY    . HIP SURGERY    . TUBAL LIGATION      FAMILY HISTORY: Family History  Problem Relation Age of Onset  . Diabetes Daughter   . Colon cancer Maternal Grandfather     SOCIAL HISTORY: Social History   Socioeconomic History  . Marital status: Married    Spouse name: [name]  . Number of children: 2  . Years of education: 66  . Highest education level: Not on file  Occupational History    Employer: RETIRED  Social Needs  . Financial resource strain: Not on file  . Food insecurity    Worry: Not on file    Inability: Not on file  . Transportation needs    Medical: Not on file    Non-medical: Not on file  Tobacco Use  . Smoking status: Former Smoker    Types: Cigarettes    Quit date: 09/26/2007    Years since quitting: 11.2  . Smokeless tobacco: Never Used  Substance and Sexual Activity  . Alcohol use: No  . Drug use: No  . Sexual activity: Never  Lifestyle  . Physical activity    Days per week: Not on file    Minutes per session: Not on file  . Stress: Not on file  Relationships  . Social Herbalist on phone: Not on file    Gets together: Not on file    Attends religious service: Not on file    Active member of club or organization: Not on file    Attends meetings of clubs or organizations: Not on file    Relationship status: Not on file  . Intimate partner violence    Fear of current or ex partner: Not on file    Emotionally abused: Not on file    Physically abused: Not on file    Forced sexual activity: Not on file  Other Topics Concern  . Not on file  Social History Narrative   Lives with husband     PHYSICAL EXAM  GENERAL EXAM/CONSTITUTIONAL: Vitals:  Vitals:   12/24/18 1109  BP: 130/63  Pulse: 65  Temp: (!) 97.5 F (36.4 C)   Weight: 128 lb (58.1 kg)  Height: 5\' 7"  (1.702 m)     Body mass index is 20.05 kg/m. Wt Readings from Last 3 Encounters:  12/24/18 128 lb (58.1 kg)  12/01/18 139 lb (63 kg)  10/20/18 137 lb 12.8 oz (62.5 kg)     Patient is in no distress; well developed, nourished and groomed; neck is supple  CARDIOVASCULAR:  Examination of carotid arteries is normal; no  carotid bruits  Regular rate and rhythm, no murmurs  Examination of peripheral vascular system by observation and palpation is normal  EYES:  Ophthalmoscopic exam of optic discs and posterior segments is normal; no papilledema or hemorrhages  No exam data present  MUSCULOSKELETAL:  Gait, strength, tone, movements noted in Neurologic exam below  NEUROLOGIC: MENTAL STATUS:  No flowsheet data found.  awake, alert, oriented to person  [facility] memory   DECR attention and concentration  SEVERE EXPRESSIVE APHASIA; FOLLOWS SIMPLE COMMANDS  CRANIAL NERVE:   2nd - no papilledema on fundoscopic exam  2nd, 3rd, 4th, 6th - pupils equal and reactive to light, visual fields full to confrontation, extraocular muscles intact, no nystagmus  5th - facial sensation symmetric  7th - facial strength --> DECR RIGHT LOWER STRENGTH  8th - hearing intact  9th - palate elevates symmetrically, uvula midline  11th - shoulder shrug symmetric  12th - tongue protrusion midline  MODERATE DYSARTHRIA  MOTOR:   INCREASED TONE IN RUE AND RLE; RUE 0; RLE 1-2  LUE / LLE 5  SENSORY:   normal and symmetric to light touch  COORDINATION:   finger-nose-finger, fine finger movements SLOW ON LEFT; CANNOT ON RIGHT DUE TO WEAKNESS  REFLEXES:   deep tendon reflexes BRISK ON RIGHT SIDE  GAIT/STATION:   IN WHEELCHAIR     DIAGNOSTIC DATA (LABS, IMAGING, TESTING) - I reviewed patient records, labs, notes, testing and imaging myself where available.  Lab Results  Component Value Date   WBC 9.9 10/10/2018   HGB 14.2  10/10/2018   HCT 41.7 10/10/2018   MCV 83 10/10/2018   PLT 308 10/10/2018      Component Value Date/Time   NA 144 10/10/2018 1101   K 3.9 10/10/2018 1101   CL 107 (H) 10/10/2018 1101   CO2 21 10/10/2018 1101   GLUCOSE 121 (H) 10/10/2018 1101   GLUCOSE 92 09/20/2018 0606   BUN 15 10/10/2018 1101   CREATININE 0.82 10/10/2018 1101   CREATININE 0.83 09/30/2014 0852   CALCIUM 9.7 10/10/2018 1101   PROT 7.4 10/10/2018 1101   ALBUMIN 4.4 10/10/2018 1101   AST 38 10/10/2018 1101   ALT 20 10/10/2018 1101   ALKPHOS 140 (H) 10/10/2018 1101   BILITOT 0.2 10/10/2018 1101   GFRNONAA 73 10/10/2018 1101   GFRNONAA 75 03/26/2014 1105   GFRAA 84 10/10/2018 1101   GFRAA 87 03/26/2014 1105   Lab Results  Component Value Date   CHOL 134 06/25/2018   HDL 42 06/25/2018   LDLCALC 71 06/25/2018   TRIG 82 09/14/2018   CHOLHDL 3.2 06/25/2018   Lab Results  Component Value Date   HGBA1C 5.9 (H) 09/13/2018   No results found for: VITAMINB12 Lab Results  Component Value Date   TSH 1.846 10/23/2013    09/12/18 MRI brain [I reviewed images myself and agree with interpretation. -VRP]  1. No acute intracranial abnormality. Remote left MCA distribution infarct. 2. No acute cervical spine fracture. Multilevel degenerative changes of the cervical spine as described above. 3. Endotracheal and transesophageal tubes are noted within their respective lumens. Small amount of airways secretions noted above the inflated endotracheal balloon.     ASSESSMENT AND PLAN  68 y.o. year old female here with:  Dx:  1. Seizure disorder ([place])   2. Cerebrovascular accident (CVA) due to embolism of left middle cerebral artery ([place])   3. Hemiplegia of dominant side as late effect following cerebrovascular disease ([place])     PLAN:  SEIZURE  DISORDER (post-stroke) - continue levetiracetam 1500mg  twice a day  - continue vimpat 100mg  twice a day   Meds ordered this encounter  Medications  . levETIRAcetam  (KEPPRA) 750 MG tablet    Sig: Take 2 tablets (1,500 mg total) by mouth 2 (two) times daily.    Dispense:  360 tablet    Refill:  4  . Lacosamide 100 MG TABS    Sig: Take 1 tablet (100 mg total) by mouth 2 (two) times daily.    Dispense:  180 tablet    Refill:  4   Return in about 1 year (around 12/24/2019) for with NP (Amy Lomax).    Penni Bombard, MD [id number], [id number] AM Certified in Neurology, Neurophysiology and Neuroimaging  [facility] [address] [place], [place] [address] [phone]

## 2018-12-29 ENCOUNTER — Other Ambulatory Visit: Payer: Self-pay | Admitting: Family Medicine

## 2018-12-29 DIAGNOSIS — Z7901 Long term (current) use of anticoagulants: Secondary | ICD-10-CM

## 2018-12-29 NOTE — Telephone Encounter (Signed)
Requested medication (s) are due for refill today: yes  Requested medication (s) are on the active medication list: yes  Last refill:  09/04/2018  Future visit scheduled: yes  Notes to clinic:  Refill cannot be delegated    Requested Prescriptions  Pending Prescriptions Disp Refills   warfarin (COUMADIN) 5 MG tablet [Pharmacy Med Name: WARFARIN SODIUM 5 MG Tablet] 90 tablet 0    Sig: TAKE AS DIRECTED BY COUMADIN CLINIC. NEED OFFICE VISIT WITH FASTING LABS FOR REFILLS     Hematology:  Anticoagulants - warfarin Failed - 12/29/2018  9:58 AM      Failed - This refill cannot be delegated      Failed - If the patient is managed by Coumadin Clinic - route to their Pool. If not, forward to the provider.      Failed - INR in normal range and within 30 days    INR  Date Value Ref Range Status  09/20/2018 2.7 (H) 0.8 - 1.2 Final    Comment:    (NOTE) INR goal varies based on device and disease states. Performed at [facility], [place] [address] [place], [place] [address]          Passed - Valid encounter within last 3 months    Recent Outpatient Visits          2 months ago Essential hypertension   [facility], Lilia Argue, MD   4 months ago History of embolic stroke   [facility], Lilia Argue, MD   6 months ago Medication monitoring encounter   [facility], Lilia Argue, MD   6 months ago Hemiplegia of dominant side as late effect following cerebrovascular disease [facility])   [facility], Lilia Argue, MD   1 year ago Hemiplegia of dominant side as late effect following cerebrovascular disease [facility])   [facility], Laurey Arrow, MD      Future Appointments            In 3 months Rutherford Guys, MD Primary Care at [place], [facility]

## 2019-01-01 NOTE — Telephone Encounter (Signed)
No, [facility] at [facility] her coumadin

## 2019-01-01 NOTE — Telephone Encounter (Signed)
Is it ok to send this to pt

## 2019-01-02 MED ORDER — NYSTATIN 100000 UNIT/GM EX POWD: Freq: Every day | CUTANEOUS | 3 refills | Status: DC

## 2019-01-02 NOTE — Telephone Encounter (Signed)
Pt is requesting a refill on Nystatin powder. Pt was last seen on 10/10/2018 but it does not show that you filled this Rx before. Please Advise. Pk/CMA

## 2019-01-05 NOTE — Telephone Encounter (Signed)
Lm message for pt to call bk, this med should filled by [facility], Oren Section

## 2019-01-05 NOTE — Telephone Encounter (Signed)
Lm for pt to call bk regarding coumadin. The refilling of the med should be done by [facility], Oren Section

## 2019-01-08 DIAGNOSIS — I639 Cerebral infarction, unspecified: Secondary | ICD-10-CM | POA: Diagnosis not present

## 2019-01-08 DIAGNOSIS — Z7901 Long term (current) use of anticoagulants: Secondary | ICD-10-CM | POA: Diagnosis not present

## 2019-01-29 ENCOUNTER — Other Ambulatory Visit: Payer: Self-pay | Admitting: Cardiovascular Disease

## 2019-02-02 ENCOUNTER — Ambulatory Visit: Payer: Medicare HMO | Admitting: Diagnostic Neuroimaging

## 2019-02-06 ENCOUNTER — Other Ambulatory Visit: Payer: Self-pay | Admitting: Family Medicine

## 2019-02-18 ENCOUNTER — Other Ambulatory Visit: Payer: Self-pay | Admitting: Family Medicine

## 2019-02-18 DIAGNOSIS — Z7901 Long term (current) use of anticoagulants: Secondary | ICD-10-CM

## 2019-02-18 NOTE — Telephone Encounter (Signed)
Pt's husband called requesting emergency supply refill for [facility] on [address]  Please advise

## 2019-02-18 NOTE — Telephone Encounter (Signed)
Pt requesting a 10 day supply refill since the original refill will not make it in time before she runs out   warfarin (COUMADIN) 5 MG tablet [id number]    To [facility]\  Please advise

## 2019-02-18 NOTE — Telephone Encounter (Signed)
Requested medication (s) are due for refill today: no  Requested medication (s) are on the active medication list: yes  Last refill:  06/26/2018  Future visit scheduled: yes  Notes to clinic:  patient has appointment on 04/09/2019   Requested Prescriptions  Pending Prescriptions Disp Refills   warfarin (COUMADIN) 5 MG tablet [Pharmacy Med Name: WARFARIN SODIUM 5 MG Tablet] 90 tablet 0    Sig: TAKE AS DIRECTED BY COUMADIN CLINIC. NEED OFFICE VISIT WITH FASTING LABS FOR REFILLS      Hematology:  Anticoagulants - warfarin Failed - 02/18/2019 10:10 AM      Failed - This refill cannot be delegated      Failed - If the patient is managed by Coumadin Clinic - route to their Pool. If not, forward to the provider.      Failed - INR in normal range and within 30 days    INR  Date Value Ref Range Status  09/20/2018 2.7 (H) 0.8 - 1.2 Final    Comment:    (NOTE) INR goal varies based on device and disease states. Performed at [facility], [place] [address] [place], [place] [address]           Failed - Valid encounter within last 3 months    Recent Outpatient Visits           4 months ago Essential hypertension   [facility], Lilia Argue, MD   6 months ago History of embolic stroke   [facility], Lilia Argue, MD   7 months ago Medication monitoring encounter   [facility], Lilia Argue, MD   8 months ago Hemiplegia of dominant side as late effect following cerebrovascular disease [facility])   [facility], Lilia Argue, MD   1 year ago Hemiplegia of dominant side as late effect following cerebrovascular disease [facility])   [facility], Laurey Arrow, MD       Future Appointments             In 1 month Rutherford Guys, MD Primary Care at [place], [facility]

## 2019-02-18 NOTE — Telephone Encounter (Signed)
Please advise if this rx can be refilled

## 2019-02-23 ENCOUNTER — Other Ambulatory Visit: Payer: Self-pay | Admitting: Family Medicine

## 2019-02-25 ENCOUNTER — Telehealth: Payer: Self-pay | Admitting: Family Medicine

## 2019-02-25 DIAGNOSIS — I639 Cerebral infarction, unspecified: Secondary | ICD-10-CM | POA: Diagnosis not present

## 2019-02-25 DIAGNOSIS — Z7901 Long term (current) use of anticoagulants: Secondary | ICD-10-CM | POA: Diagnosis not present

## 2019-02-25 NOTE — Telephone Encounter (Signed)
Pharmacy calling about fax sent by them for a med refill on pt. Furosemide medication.

## 2019-02-26 ENCOUNTER — Other Ambulatory Visit: Payer: Self-pay

## 2019-02-26 MED ORDER — FUROSEMIDE 40 MG PO TABS: 40.0000 mg | ORAL_TABLET | Freq: Every day | ORAL | 0 refills | Status: DC

## 2019-02-26 NOTE — Telephone Encounter (Signed)
Medication sent for 30 days until appt in March.

## 2019-03-16 ENCOUNTER — Other Ambulatory Visit: Payer: Self-pay | Admitting: Family Medicine

## 2019-03-16 DIAGNOSIS — M816 Localized osteoporosis [Lequesne]: Secondary | ICD-10-CM

## 2019-03-16 NOTE — Telephone Encounter (Signed)
Requested medications are due for refill today?  Yes - Non-Delegated  Requested medications are on active medication list?  Yes  Last Refill:   12/16/2018 # 12 capsules with 0 refills.    Future visit scheduled?  No   Notes to Clinic:   Dosage strength - Non-delegated

## 2019-03-19 NOTE — Telephone Encounter (Signed)
She does not need prescribed vitamin D anymore. She should be taking an over the counter vitamin d supplement of 2000 units daily. thanks

## 2019-03-19 NOTE — Telephone Encounter (Signed)
Called pt to let her know that it is ok per the doctor for her to by otc vit d supplements. They do not need to be rx'd

## 2019-03-22 ENCOUNTER — Ambulatory Visit: Payer: Medicare HMO | Attending: Internal Medicine

## 2019-03-22 DIAGNOSIS — Z23 Encounter for immunization: Secondary | ICD-10-CM

## 2019-03-22 NOTE — Progress Notes (Signed)
   Covid-19 Vaccination Clinic  Name:  [name]    MRN: [id number] DOB: 11/17/1950  03/22/2019  [name] was observed post Covid-19 immunization for 15 minutes without incidence. She was provided with Vaccine Information Sheet and instruction to access the V-Safe system.   [name] was instructed to call 911 with any severe reactions post vaccine: [place] Kitchen Difficulty breathing  . Swelling of your face and throat  . A fast heartbeat  . A bad rash all over your body  . Dizziness and weakness    Immunizations Administered    Name Date Dose VIS Date Route   Pfizer COVID-19 Vaccine 03/22/2019  9:04 AM 0.3 mL 01/02/2019 Intramuscular   Manufacturer: [place]   Lot: [id number]   [place]: [id number]

## 2019-04-08 DIAGNOSIS — I639 Cerebral infarction, unspecified: Secondary | ICD-10-CM | POA: Diagnosis not present

## 2019-04-08 DIAGNOSIS — Z7901 Long term (current) use of anticoagulants: Secondary | ICD-10-CM | POA: Diagnosis not present

## 2019-04-09 ENCOUNTER — Encounter: Payer: Self-pay | Admitting: Family Medicine

## 2019-04-09 ENCOUNTER — Other Ambulatory Visit: Payer: Self-pay

## 2019-04-09 ENCOUNTER — Ambulatory Visit (INDEPENDENT_AMBULATORY_CARE_PROVIDER_SITE_OTHER): Payer: Medicare HMO | Admitting: Family Medicine

## 2019-04-09 VITALS — BP 116/55 | HR 68 | Temp 98.0°F | Wt 127.0 lb

## 2019-04-09 DIAGNOSIS — M816 Localized osteoporosis [Lequesne]: Secondary | ICD-10-CM | POA: Diagnosis not present

## 2019-04-09 DIAGNOSIS — Z5181 Encounter for therapeutic drug level monitoring: Secondary | ICD-10-CM

## 2019-04-09 DIAGNOSIS — E782 Mixed hyperlipidemia: Secondary | ICD-10-CM | POA: Diagnosis not present

## 2019-04-09 DIAGNOSIS — L602 Onychogryphosis: Secondary | ICD-10-CM

## 2019-04-09 DIAGNOSIS — I69959 Hemiplegia and hemiparesis following unspecified cerebrovascular disease affecting unspecified side: Secondary | ICD-10-CM

## 2019-04-09 DIAGNOSIS — Z7901 Long term (current) use of anticoagulants: Secondary | ICD-10-CM | POA: Diagnosis not present

## 2019-04-09 DIAGNOSIS — Z79899 Other long term (current) drug therapy: Secondary | ICD-10-CM | POA: Diagnosis not present

## 2019-04-09 DIAGNOSIS — E559 Vitamin D deficiency, unspecified: Secondary | ICD-10-CM

## 2019-04-09 DIAGNOSIS — R569 Unspecified convulsions: Secondary | ICD-10-CM | POA: Diagnosis not present

## 2019-04-09 NOTE — Patient Instructions (Signed)
° ° ° °  If you have lab work done today you will be contacted with your lab results within the next 2 weeks.  If you have not heard from us then please contact us. The fastest way to get your results is to register for My Chart. ° ° °IF you received an x-ray today, you will receive an invoice from [facility]. Please contact [facility] at [phone] with questions or concerns regarding your invoice.  ° °IF you received labwork today, you will receive an invoice from [facility]. Please contact [facility] at [phone] with questions or concerns regarding your invoice.  ° °Our billing staff will not be able to assist you with questions regarding bills from these companies. ° °You will be contacted with the lab results as soon as they are available. The fastest way to get your results is to activate your My Chart account. Instructions are located on the last page of this paperwork. If you have not heard from us regarding the results in 2 weeks, please contact this office. °  ° ° ° °

## 2019-04-09 NOTE — Progress Notes (Signed)
3/18/202110:22 AM  [name] 1950-04-04, 69 y.o., female [id number]  Chief Complaint  Patient presents with  . Follow-up    6 month follow up  . Foot Problem    left foot, great toe having discomfort    HPI:   Patient is a 69 y.o. female with past medical history significant for embolic CVA sequela, on coumadin, seizures, HTN with sCHF on digoxin, HLP, sickle cell anemia, vitamin D deficiency, osteoporosis with h/o right hip fracture   who presents today for routine followup  Last OV Sept 2020 Has established with cards, last OV Nov 2020 - remain on dig, changed to entresto Last neuro appt Dec 2020 Last dexa may 2019, started fosamax in nov 2016  Patient's husband reports that she has never taken furosemide outpatient, it was started at home but has not received since discharge She is overall doing well She has good appetite, denies any pain other than caused by her toenails, requesting to see podiatrist She sleeps well  Denies any falls Uses ALSO brace for right foot drop Uses brace on right hand also  Depression screen [facility] 2/9 04/09/2019 08/28/2017 08/07/2017  Decreased Interest 0 0 0  Down, Depressed, Hopeless 0 0 0  PHQ - 2 Score 0 0 0    Fall Risk  04/09/2019 10/10/2018 08/28/2017 08/07/2017 01/12/2017  Falls in the past year? 0 0 No No No  Number falls in past yr: 0 1 - - -  Injury with Fall? 0 1 - - -  Risk for fall due to : Impaired balance/gait;Mental status change - - - -     Allergies  Allergen Reactions  . Lexiscan [Regadenoson] Other (See Comments)    Perioral tingling and throat tightness    Prior to Admission medications   Medication Sig Start Date End Date Taking? Authorizing Provider  alendronate (FOSAMAX) 70 MG tablet TAKE 1 TABLET EVERY 7 DAYS WITH FULL GLASS OF WATER ON AN EMPTY STOMACH 03/04/18  Yes [name], MD  atorvastatin (LIPITOR) 80 MG tablet TAKE 1 TABLET AT BEDTIME 02/06/19  Yes Rutherford Guys, MD  baclofen (LIORESAL) 20 MG tablet  TAKE 1 TABLET THREE TIMES DAILY (MAY TAKE AN ADDITIONAL TABLET MID-DAY OR BEFORE BED AS NEEDED FOR MUSCLE SPASMS) 06/09/18  Yes Rutherford Guys, MD  capsicum oleoresin (TRIXAICIN) 0.025 % cream Apply 1 application topically daily as needed (arthiritis pain).   Yes [provider]  carvedilol (COREG) 6.25 MG tablet TAKE 1 TABLET TWICE DAILY 12/16/18  Yes Rutherford Guys, MD  digoxin (LANOXIN) 0.125 MG tablet TAKE 1 TABLET (125 MCG TOTAL) BY MOUTH DAILY. 12/16/18  Yes Rutherford Guys, MD  ENTRESTO 24-26 MG TAKE 1 TABLET TWICE DAILY 01/30/19  Yes Skeet Latch, MD  furosemide (LASIX) 40 MG tablet Take 1 tablet (40 mg total) by mouth daily. 02/26/19  Yes Rutherford Guys, MD  Lacosamide 100 MG TABS Take 1 tablet (100 mg total) by mouth 2 (two) times daily. 12/24/18  Yes Penumalli, Earlean Polka, MD  levETIRAcetam (KEPPRA) 750 MG tablet Take 2 tablets (1,500 mg total) by mouth 2 (two) times daily. 12/24/18 03/18/20 Yes Penumalli, Earlean Polka, MD  nystatin (NYSTATIN) powder Apply topically daily. Apply under breasts once daily after bath 01/02/19  Yes Rutherford Guys, MD  polyethylene glycol [facility] / GLYCOLAX) packet Take 17 g by mouth daily as needed for moderate constipation.   Yes [provider]  triamcinolone cream (KENALOG) 0.1 % Apply 1 application topically 2 (  two) times daily. 03/26/14  Yes [name], MD  Vitamin D, Ergocalciferol, (DRISDOL) 1.25 MG (50000 UT) CAPS capsule TAKE 1 CAPSULE EVERY 7 DAYS 12/16/18  Yes Rutherford Guys, MD  warfarin (COUMADIN) 5 MG tablet TAKE AS DIRECTED BY COUMADIN CLINIC. NEED OFFICE VISIT WITH FASTING LABS FOR REFILLS Patient taking differently: Take 5 mg by mouth one time only at 6 PM. Take 1 tablet (5mg ) po daily at 6PM. 09/03/18  Yes Rutherford Guys, MD    Past Medical History:  Diagnosis Date  . Adenomatous polyp of colon 09/2012   repeat colonoscopy in 5 years by Dr. Sharlett Iles  . CHF (congestive heart failure) (HCC)    EF 15-20% as of 05/28/11  (Dr [facility] summary)  . CVA (cerebral infarction) 12/2007  . Hemiparesis ([place])   . Hyperlipidemia   . Hypertension   . Seizures ([place])   . Sickle cell anemia (HCC)   . Stroke ([place])   . Vitamin D deficiency 2010    Past Surgical History:  Procedure Laterality Date  . ABDOMINAL HYSTERECTOMY    . FRACTURE SURGERY    . HIP SURGERY    . TUBAL LIGATION      Social History   Tobacco Use  . Smoking status: Former Smoker    Types: Cigarettes    Quit date: 09/26/2007    Years since quitting: 11.5  . Smokeless tobacco: Never Used  Substance Use Topics  . Alcohol use: No    Family History  Problem Relation Age of Onset  . Diabetes Daughter   . Colon cancer Maternal Grandfather     Review of Systems  Constitutional: Negative for chills and fever.  Respiratory: Negative for cough and shortness of breath.   Cardiovascular: Negative for chest pain, palpitations and leg swelling.  Gastrointestinal: Negative for abdominal pain, nausea and vomiting.  per hpi   OBJECTIVE:  Today's Vitals   04/09/19 1006  BP: (!) 116/55  Pulse: 68  Temp: 98 F (36.7 C)  Weight: 127 lb (57.6 kg)   Body mass index is 19.89 kg/m.   Physical Exam Vitals and nursing note reviewed.  Constitutional:      Appearance: She is well-developed.  HENT:     Head: Normocephalic and atraumatic.     Mouth/Throat:     Pharynx: No oropharyngeal exudate.  Eyes:     General: No scleral icterus.    Conjunctiva/sclera: Conjunctivae normal.     Pupils: Pupils are equal, round, and reactive to light.  Cardiovascular:     Rate and Rhythm: Normal rate and regular rhythm.     Heart sounds: Normal heart sounds. No murmur. No friction rub. No gallop.   Pulmonary:     Effort: Pulmonary effort is normal.     Breath sounds: Normal breath sounds. No wheezing, rhonchi or rales.  Musculoskeletal:     Cervical back: Neck supple.     Right lower leg: No edema.     Left lower leg: No edema.      Right foot: Foot drop present.     Left foot: No foot drop.  Feet:     Right foot:     Toenail Condition: Right toenails are abnormally thick and long.     Left foot:     Toenail Condition: Left toenails are abnormally thick and long.  Skin:    General: Skin is warm and dry.  Neurological:     Mental Status: She is alert and oriented to person,  place, and time.     No results found for this or any previous visit (from the past 24 hour(s)).  No results found.   ASSESSMENT and PLAN  1. Localized osteoporosis without current pathological fracture On alendronate. Remote h/o right hip fx. No falls. Checking vitamin D. Ordering dexa for surveillance.  - DG Bone Density; Future  2. Mixed hyperlipidemia Checking labs today, medications will be adjusted as needed.  - Comprehensive metabolic panel - Lipid panel  3. Vitamin D deficiency - Vitamin D, 25-hydroxy  4. Hemiplegia of dominant side as late effect following cerebrovascular disease (HCC) Stable.   5. Anticoagulated on Coumadin - CBC  6. Encounter for monitoring digoxin therapy - Digoxin, Random, Serum  7. Thickened nails - Ambulatory referral to Podiatry  Return in about 6 months (around 10/10/2019).    Rutherford Guys, MD [facility] [place] [address], [place] [address] Ph.  [phone] Fax [phone]

## 2019-04-10 LAB — COMPREHENSIVE METABOLIC PANEL
ALT: 13 IU/L (ref 0–32)
AST: 29 IU/L (ref 0–40)
Albumin/Globulin Ratio: 1.4 (ref 1.2–2.2)
Albumin: 4.3 g/dL (ref 3.8–4.8)
Alkaline Phosphatase: 181 IU/L — ABNORMAL HIGH (ref 39–117)
BUN/Creatinine Ratio: 13 (ref 12–28)
BUN: 11 mg/dL (ref 8–27)
Bilirubin Total: 0.3 mg/dL (ref 0.0–1.2)
CO2: 20 mmol/L (ref 20–29)
Calcium: 9.3 mg/dL (ref 8.7–10.3)
Chloride: 106 mmol/L (ref 96–106)
Creatinine, Ser: 0.83 mg/dL (ref 0.57–1.00)
GFR calc Af Amer: 83 mL/min/{1.73_m2} (ref >59)
GFR calc non Af Amer: 72 mL/min/{1.73_m2} (ref >59)
Globulin, Total: 3.1 g/dL (ref 1.5–4.5)
Glucose: 97 mg/dL (ref 65–99)
Potassium: 4 mmol/L (ref 3.5–5.2)
Sodium: 145 mmol/L — ABNORMAL HIGH (ref 134–144)
Total Protein: 7.4 g/dL (ref 6.0–8.5)

## 2019-04-10 LAB — CBC
Hematocrit: 43 % (ref 34.0–46.6)
Hemoglobin: 13.6 g/dL (ref 11.1–15.9)
MCH: 26.6 pg (ref 26.6–33.0)
MCHC: 31.6 g/dL (ref 31.5–35.7)
MCV: 84 fL (ref 79–97)
Platelets: 334 10*3/uL (ref 150–450)
RBC: 5.11 x10E6/uL (ref 3.77–5.28)
RDW: 13.2 % (ref 11.7–15.4)
WBC: 7.3 10*3/uL (ref 3.4–10.8)

## 2019-04-10 LAB — LIPID PANEL
Chol/HDL Ratio: 3.1 ratio (ref 0.0–4.4)
Cholesterol, Total: 123 mg/dL (ref 100–199)
HDL: 40 mg/dL (ref >39)
LDL Chol Calc (NIH): 63 mg/dL (ref 0–99)
Triglycerides: 109 mg/dL (ref 0–149)
VLDL Cholesterol Cal: 20 mg/dL (ref 5–40)

## 2019-04-10 LAB — VITAMIN D 25 HYDROXY (VIT D DEFICIENCY, FRACTURES): Vit D, 25-Hydroxy: 48.4 ng/mL (ref 30.0–100.0)

## 2019-04-10 LAB — DIGOXIN, RANDOM, SERUM: Digoxin, Random, Serum: 1.6 ng/mL

## 2019-04-15 ENCOUNTER — Ambulatory Visit: Payer: Medicare HMO | Attending: Internal Medicine

## 2019-04-15 DIAGNOSIS — Z23 Encounter for immunization: Secondary | ICD-10-CM

## 2019-04-15 NOTE — Progress Notes (Signed)
   Covid-19 Vaccination Clinic  Name:  [name]    MRN: [id number] DOB: 1950-02-27  04/15/2019  [name] was observed post Covid-19 immunization for 15 minutes without incident. She was provided with Vaccine Information Sheet and instruction to access the V-Safe system.   [name] was instructed to call 911 with any severe reactions post vaccine: Marland Kitchen Difficulty breathing  . Swelling of face and throat  . A fast heartbeat  . A bad rash all over body  . Dizziness and weakness   Immunizations Administered    Name Date Dose VIS Date Route   Pfizer COVID-19 Vaccine 04/15/2019 10:50 AM 0.3 mL 01/02/2019 Intramuscular   Manufacturer: [place]   Lot: [id number]   [place]: [id number]

## 2019-04-28 ENCOUNTER — Encounter: Payer: Self-pay | Admitting: Radiology

## 2019-05-08 ENCOUNTER — Ambulatory Visit: Payer: Medicare HMO | Admitting: Podiatry

## 2019-05-14 ENCOUNTER — Other Ambulatory Visit: Payer: Self-pay

## 2019-05-14 ENCOUNTER — Encounter: Payer: Self-pay | Admitting: Podiatry

## 2019-05-14 ENCOUNTER — Ambulatory Visit (INDEPENDENT_AMBULATORY_CARE_PROVIDER_SITE_OTHER): Payer: Medicare HMO | Admitting: Podiatry

## 2019-05-14 DIAGNOSIS — M79609 Pain in unspecified limb: Secondary | ICD-10-CM

## 2019-05-14 DIAGNOSIS — M722 Plantar fascial fibromatosis: Secondary | ICD-10-CM | POA: Diagnosis not present

## 2019-05-14 DIAGNOSIS — M79676 Pain in unspecified toe(s): Secondary | ICD-10-CM | POA: Diagnosis not present

## 2019-05-14 DIAGNOSIS — B351 Tinea unguium: Secondary | ICD-10-CM | POA: Diagnosis not present

## 2019-05-14 NOTE — Progress Notes (Signed)
Subjective:   Patient ID: [name], female   DOB: 69 y.o.   MRN: [id number]   HPI Patient presents with thickened nailbeds that she cannot take care of and history of severe dropfoot deformity and inflammation of the plantar heel left over right with arch pain and bracing AFO but cannot wear all the time and needs a lighter brace to hold up her arch   ROS      Objective:  Physical Exam  Neurovascular status unchanged with patient having significant stroke with pathology with fascial-like inflammation and a thick yellow brittle nailbeds 1-5 both feet that can be painful     Assessment:  Chronic mycotic nail infections 1-5 both feet with significant foot drop and arch fascial inflammation     Plan:  H&P reviewed both conditions and brace dispensed to try to support the arch when she cannot wear the AFO.  Debrided nailbeds 1-5 both feet educated her on fascial inflammation and foot structure and reappoint for [place] to recheck again for routine care or if were having issues with the other pathology

## 2019-05-15 ENCOUNTER — Other Ambulatory Visit: Payer: Self-pay | Admitting: Internal Medicine

## 2019-05-15 DIAGNOSIS — Z1231 Encounter for screening mammogram for malignant neoplasm of breast: Secondary | ICD-10-CM

## 2019-05-21 DIAGNOSIS — I639 Cerebral infarction, unspecified: Secondary | ICD-10-CM | POA: Diagnosis not present

## 2019-05-21 DIAGNOSIS — Z7901 Long term (current) use of anticoagulants: Secondary | ICD-10-CM | POA: Diagnosis not present

## 2019-05-25 NOTE — Progress Notes (Signed)
Cardiology Office Note:    Date:  05/26/2019   ID:  [name], DOB 10-May-1950, MRN [id number]  PCP:  Rutherford Guys, MD  Cardiologist:  Skeet Latch, MD  Electrophysiologist:  None   Referring MD: Rutherford Guys, MD   Chief Complaint: follow-up of chronic combined CHF  History of Present Illness:    [name] is a 69 y.o. female with a history of chronic combined CHF with EF as low as 15-20% in 2013 but improved to [id number] in [id number], embolic stroke with residual expressive aphasia, seizures, hypertension, hyperlipidemia, and sickle cell anemia who is followed by Dr. Buddy Duty and presents today for routine follow-up.   Patient has a long history of chronic combined CHF. Per a discharge summary from 05/2011, patient had an EF of 15-20% with severe diffuse LV hypokinesis on Echo in 2009 and has been maintained on Digoxin and beta-blocker as well as Warfarin for ventricular hypokinesis.   Patient first seen by Dr. Oval [name] in 09/2018 for hospital follow-up of acute on chronic combined CHF. She was hospitalized in 08/2018 for status epilepticus requiring intubation in the setting of being out of her medications. High-sensitivity troponin peaked at 1,905. Echo showed LVEF of 35-40% with moderate hypokinesis of the inferior and lateral myocardium, moderate LVH, and grade 1 diastolic dysfunction. She initially noted chest pain during hospitalization but by the time Cardiology evaluated her she didn't remember this event. She underwent Myoview on 09/20/2018 which showed a large, severe mostly fixed defect in the in the inferior, inferoapical, and inferolateral myocardium. There was a little reversible ischemia as well. Patient was diuresed on IV Lasix and then discharged on PO Lasix 40mg  daily, Coreg 6.25mg  twice daily, Losartan 25mg  daily, and Digoxin 0.125mg  daily. She was doing well from a cardiac standpoint at follow-up visit with Dr. Oval [name]. Losartan was switched to Entresto at that  visit. Patient was last seen by Dr. Oval [name] in 11/2018 at which time she noted some right sided swelling ever since her stroke but denied any left sided edema. She was otherwise doing well from a cardiac standpoint and was tolerating Entresto well.   Patient presents today for follow-up. Here with husband. Difficult to understand patient due to expressive aphasia from prior stroke so husband is helpful in gathering history. She has been doing well from a cardiac standpoint. No chest pain, shortness of breath, orthopnea, PND, edema. She has occasional lightheadedness/dizziness if she is working in the kitchen and it gets hot in there but no palpitations or syncope.  Past Medical History:  Diagnosis Date  . Adenomatous polyp of colon 09/2012   repeat colonoscopy in 5 years by Dr. Sharlett Iles  . CHF (congestive heart failure) (HCC)    EF 15-20% as of 05/28/11 (Dr [facility]grand [facility] D/C summary)  . CVA (cerebral infarction) 12/2007  . Hemiparesis ([place])   . Hyperlipidemia   . Hypertension   . Seizures ([place])   . Sickle cell anemia (HCC)   . Stroke ([place])   . Vitamin D deficiency 2010    Past Surgical History:  Procedure Laterality Date  . ABDOMINAL HYSTERECTOMY    . FRACTURE SURGERY    . HIP SURGERY    . TUBAL LIGATION      Current Medications: Current Meds  Medication Sig  . alendronate (FOSAMAX) 70 MG tablet TAKE 1 TABLET EVERY 7 DAYS WITH FULL GLASS OF WATER ON AN EMPTY STOMACH  . atorvastatin (LIPITOR) 80 MG tablet TAKE 1 TABLET AT BEDTIME  . baclofen (LIORESAL)  20 MG tablet TAKE 1 TABLET THREE TIMES DAILY (MAY TAKE AN ADDITIONAL TABLET MID-DAY OR BEFORE BED AS NEEDED FOR MUSCLE SPASMS)  . carvedilol (COREG) 6.25 MG tablet TAKE 1 TABLET TWICE DAILY  . digoxin (LANOXIN) 0.125 MG tablet TAKE 1 TABLET (125 MCG TOTAL) BY MOUTH DAILY.  Marland Kitchen ENTRESTO 24-26 MG TAKE 1 TABLET TWICE DAILY  . furosemide (LASIX) 40 MG tablet Take 1 tablet (40 mg total) by mouth daily.  . Lacosamide 100 MG TABS  Take 1 tablet (100 mg total) by mouth 2 (two) times daily.  Marland Kitchen levETIRAcetam (KEPPRA) 750 MG tablet Take 2 tablets (1,500 mg total) by mouth 2 (two) times daily.  Marland Kitchen nystatin (NYSTATIN) powder Apply topically daily. Apply under breasts once daily after bath  . polyethylene glycol (MIRALAX / GLYCOLAX) packet Take 17 g by mouth daily as needed for moderate constipation.  . triamcinolone cream (KENALOG) 0.1 % Apply 1 application topically 2 (two) times daily.  . Vitamin D, Ergocalciferol, (DRISDOL) 1.25 MG (50000 UT) CAPS capsule TAKE 1 CAPSULE EVERY 7 DAYS  . warfarin (COUMADIN) 5 MG tablet TAKE AS DIRECTED BY COUMADIN CLINIC. NEED OFFICE VISIT WITH FASTING LABS FOR REFILLS (Patient taking differently: Take 5 mg by mouth one time only at 6 PM. Take 1 tablet (5mg ) po daily at 6PM.)     Allergies:   Lexiscan [regadenoson]   Social History   Socioeconomic History  . Marital status: Married    Spouse name: [name]  . Number of children: 2  . Years of education: 63  . Highest education level: Not on file  Occupational History    Employer: RETIRED  Tobacco Use  . Smoking status: Former Smoker    Types: Cigarettes    Quit date: 09/26/2007    Years since quitting: 11.6  . Smokeless tobacco: Never Used  Substance and Sexual Activity  . Alcohol use: No  . Drug use: No  . Sexual activity: Not Currently  Other Topics Concern  . Not on file  Social History Narrative   Lives with husband   Social Determinants of Health   Financial Resource Strain:   . Difficulty of Paying Living Expenses:   Food Insecurity:   . Worried About Charity fundraiser in the Last Year:   . Arboriculturist in the Last Year:   Transportation Needs:   . Film/video editor (Medical):   Marland Kitchen Lack of Transportation (Non-Medical):   Physical Activity:   . Days of Exercise per Week:   . Minutes of Exercise per Session:   Stress:   . Feeling of Stress :   Social Connections:   . Frequency of Communication with  Friends and Family:   . Frequency of Social Gatherings with Friends and Family:   . Attends Religious Services:   . Active Member of Clubs or Organizations:   . Attends Archivist Meetings:   Marland Kitchen Marital Status:      Family History: The patient's family history includes Colon cancer in her maternal grandfather; Diabetes in her daughter.  ROS:   Please see the history of present illness.     All other systems reviewed and are negative.  EKGs/Labs/Other Studies Reviewed:    The following studies were reviewed today:  Echocardiogram 09/19/2018: Impressions: 1. The left ventricle has moderately reduced systolic function, with an  ejection fraction of 35-40%. The cavity size was normal. There is  moderately increased left ventricular wall thickness. Left ventricular  diastolic Doppler parameters are  consistent  with impaired relaxation. Elevated left atrial and left ventricular  end-diastolic pressures The E/e' is >20.  2. Moderate hypokinesis of the left ventricular, entire inferior wall and  lateral wall.  3. The right ventricle has normal systolic function. The cavity was  normal. There is no increase in right ventricular wall thickness.  4. Left atrial size was mildly dilated.  5. The mitral valve is grossly normal.  6. The tricuspid valve is grossly normal.  7. The aortic valve is tricuspid. Moderate sclerosis of the aortic valve.  Aortic valve regurgitation is moderate by color flow Doppler. No stenosis  of the aortic valve.  8. The aorta is normal unless otherwise noted.   Summary: LVEF 35-40%, moderate LVH, inferior and lateral hypokinesis, grade 1  DD, elevated LV filling pressure, normal RV systolic function, mild  LAE, aortic valve sclerosis with moderate AI, normal IVC. _______________  Myoview 09/20/2018:  Downsloping ST segment depression ST segment depression of 0.5 mm was noted during stress in the II, V4, V5 and V6 leads, and returning to  baseline after 1-5 minutes of recovery.  Defect 1: There is a large defect of severe severity present in the basal inferior, basal inferolateral, mid inferior, mid inferolateral, apical septal, apical inferior, apical lateral and apex location.  Findings consistent with prior myocardial infarction with peri-infarct ischemia.  This is a high risk study.  Nuclear stress EF: 34%.   Large size, severe severity mostly fixed (SDS 3) inferior, inferoapical and inferolateral perfusion defect suggestive of scar without significant peri-infarct ischemia. LVEF 34% with inferior, inferoapical and inferolateral akinesis. This is a high risk study due to low LVEF, however, no significant reversible ischemia is noted.  EKG:  EKG not ordered today.   Recent Labs: 09/17/2018: Magnesium 1.8 04/09/2019: ALT 13; BUN 11; Creatinine, Ser 0.83; Hemoglobin 13.6; Platelets 334; Potassium 4.0; Sodium 145  Recent Lipid Panel    Component Value Date/Time   CHOL 123 04/09/2019 1045   TRIG 109 04/09/2019 1045   HDL 40 04/09/2019 1045   CHOLHDL 3.1 04/09/2019 1045   CHOLHDL 3.5 09/30/2014 0852   VLDL 19 09/30/2014 0852   LDLCALC 63 04/09/2019 1045    Physical Exam:    Vital Signs: BP 138/72   Pulse 72   Ht 5\' 7"  (1.702 m)   BMI 19.89 kg/m     Wt Readings from Last 3 Encounters:  04/09/19 127 lb (57.6 kg)  12/24/18 128 lb (58.1 kg)  12/01/18 139 lb (63 kg)     General: 69 y.o. female in no acute distress. HEENT: Normocephalic and atraumatic. Sclera clear.  Neck: Supple. No carotid bruits. No JVD. Heart: RRR. Distinct S1 and S2. No murmurs, gallops, or rubs.  Lungs: No increased work of breathing. Clear to ausculation bilaterally. No wheezes, rhonchi, or rales.  Abdomen: Soft, non-distended, and non-tender to palpation. Extremities: No lower extremity edema.    Skin: Warm and dry. Neuro: Alert with residual right hemiparesis  and expressive aphasia from prior stroke.  Psych: Normal affect.    Assessment:    1. Chronic combined systolic and diastolic heart failure ([place])   2. Abnormal stress test   3. Cerebrovascular accident (CVA) due to embolism of cerebral artery ([place])   4. Essential hypertension   5. Hyperlipidemia, unspecified hyperlipidemia type   6. Therapeutic drug monitoring     Plan:    Chronic Combined CHF - Echo in 08/2018 showed LVEF of 35-40% with moderate hypokinesis of the inferior and lateral myocardium,  moderate LVH, and grade 1 diastolic dysfunction. - Stable. Patient appears euvolemic on exam.  - Continue Lasix 40mg  daily.  - Continue Entresto 24-26mg  twice daily, Coreg 6.25mg  twice daily, and Digoxin 0.125mg  daily.  - Will add Spirolactone 12.5mg  daily. - Will recheck BMET in 1 week after starting medication.   Abnormal Stress Test with Presumed CAD - Myoview during hospitalization in 08/2018 concerning for very mild peri-infarct ischemia. - No angina.  - Patient not felt to be a good cardiac catheterization candidate so treating medically.   Embolic Stroke - History of embolic stroke in [id number] with residual right hemiparesis and expressive aphasia.  - Continue Coumadin.   Hypertension - BP mildly elevated at 138/72. - Continue Entresto 24-26mg  twice daily and Coreg 6.25mg  twice daily.  - Add Spironolactone 12.5mg  daily as above.  Hyperlipidemia - Recent lipid panel from 03/2019: Total Cholesterol 123, Triglycerides 109, HDL 40, LDL 63. - At LDL goal of <70 given history of stroke and presumed CAD.  - Continue Lipitor 80mg  daily.   Disposition: Follow up with Dr. Oval [name] in 6 months.   Medication Adjustments/Labs and Tests Ordered: Current medicines are reviewed at length with the patient today.  Concerns regarding medicines are outlined above.  Orders Placed This Encounter  Procedures  . Basic metabolic panel   Meds ordered this encounter  Medications  . spironolactone (ALDACTONE) 25 MG tablet    Sig: TAKE 1/2 TABLET BY MOUTH DAILY     Dispense:  45 tablet    Refill:  1    Patient Instructions  Medication Instructions:  START SPIRONOLACTONE 25 MG 1/2 TABLET DAILY   *If you need a refill on your cardiac medications before your next appointment, please call your pharmacy*  Lab Work: BMET IN 1 WEEK   Testing/Procedures: NONE   Follow-Up: At [facility], you and your health needs are our priority.  As part of our continuing mission to provide you with exceptional heart care, we have created designated Provider Care Teams.  These Care Teams include your primary Cardiologist (physician) and Advanced Practice Providers (APPs -  Physician Assistants and Nurse Practitioners) who all work together to provide you with the care you need, when you need it.  We recommend signing up for the patient portal called "MyChart".  Sign up information is provided on this After Visit Summary.  MyChart is used to connect with patients for Virtual Visits (Telemedicine).  Patients are able to view lab/test results, encounter notes, upcoming appointments, etc.  Non-urgent messages can be sent to your provider as well.   To learn more about what you can do with MyChart, go to [email or web address].    Your next appointment:   6 month(s) You will receive a reminder letter in the mail two months in advance. If you don't receive a letter, please call our office to schedule the follow-up appointment.   The format for your next appointment:   In Person  Provider:   You may see Skeet Latch, MD or one of the following Advanced Practice Providers on your designated Care Team:    Kerin Ransom, PA-C  [place], [place]  Coletta Memos, FNP        Signed, Darreld Mclean, [place]  05/26/2019 4:48 PM    [place]

## 2019-05-25 NOTE — Telephone Encounter (Signed)
No action required.

## 2019-05-26 ENCOUNTER — Ambulatory Visit: Payer: Medicare HMO | Admitting: Student

## 2019-05-26 ENCOUNTER — Encounter: Payer: Self-pay | Admitting: Student

## 2019-05-26 ENCOUNTER — Other Ambulatory Visit: Payer: Self-pay

## 2019-05-26 VITALS — BP 138/72 | HR 72 | Ht 67.0 in

## 2019-05-26 DIAGNOSIS — I5042 Chronic combined systolic (congestive) and diastolic (congestive) heart failure: Secondary | ICD-10-CM

## 2019-05-26 DIAGNOSIS — I10 Essential (primary) hypertension: Secondary | ICD-10-CM

## 2019-05-26 DIAGNOSIS — I6340 Cerebral infarction due to embolism of unspecified cerebral artery: Secondary | ICD-10-CM

## 2019-05-26 DIAGNOSIS — Z5181 Encounter for therapeutic drug level monitoring: Secondary | ICD-10-CM

## 2019-05-26 DIAGNOSIS — E785 Hyperlipidemia, unspecified: Secondary | ICD-10-CM | POA: Diagnosis not present

## 2019-05-26 DIAGNOSIS — R9439 Abnormal result of other cardiovascular function study: Secondary | ICD-10-CM

## 2019-05-26 MED ORDER — SPIRONOLACTONE 25 MG PO TABS: ORAL_TABLET | ORAL | 1 refills | Status: DC

## 2019-05-26 NOTE — Patient Instructions (Addendum)
Medication Instructions:  START SPIRONOLACTONE 25 MG 1/2 TABLET DAILY   *If you need a refill on your cardiac medications before your next appointment, please call your pharmacy*  Lab Work: BMET IN 1 WEEK   Testing/Procedures: NONE   Follow-Up: At [facility], you and your health needs are our priority.  As part of our continuing mission to provide you with exceptional heart care, we have created designated Provider Care Teams.  These Care Teams include your primary Cardiologist (physician) and Advanced Practice Providers (APPs -  Physician Assistants and Nurse Practitioners) who all work together to provide you with the care you need, when you need it.  We recommend signing up for the patient portal called "MyChart".  Sign up information is provided on this After Visit Summary.  MyChart is used to connect with patients for Virtual Visits (Telemedicine).  Patients are able to view lab/test results, encounter notes, upcoming appointments, etc.  Non-urgent messages can be sent to your provider as well.   To learn more about what you can do with MyChart, go to [email or web address].    Your next appointment:   6 month(s) You will receive a reminder letter in the mail two months in advance. If you don't receive a letter, please call our office to schedule the follow-up appointment.   The format for your next appointment:   In Person  Provider:   You may see Skeet Latch, MD or one of the following Advanced Practice Providers on your designated Care Team:    Kerin Ransom, PA-C  [place] Negrin [place], [place]  Coletta Memos, [place]

## 2019-06-09 ENCOUNTER — Other Ambulatory Visit: Payer: Self-pay

## 2019-06-09 MED ORDER — ALENDRONATE SODIUM 70 MG PO TABS: ORAL_TABLET | ORAL | 3 refills | Status: DC

## 2019-06-09 NOTE — Telephone Encounter (Signed)
Prescription request received via fax from [facility] for Alendronate 70 mg , previously authorized by Dr Brigitte Pulse.

## 2019-07-01 DIAGNOSIS — Z7901 Long term (current) use of anticoagulants: Secondary | ICD-10-CM | POA: Diagnosis not present

## 2019-07-01 DIAGNOSIS — I639 Cerebral infarction, unspecified: Secondary | ICD-10-CM | POA: Diagnosis not present

## 2019-07-04 ENCOUNTER — Other Ambulatory Visit: Payer: Self-pay | Admitting: Family Medicine

## 2019-07-04 NOTE — Telephone Encounter (Signed)
Requested Prescriptions  Pending Prescriptions Disp Refills  . baclofen (LIORESAL) 20 MG tablet [Pharmacy Med Name: BACLOFEN 20 MG Tablet] 120 tablet     Sig: TAKE 1 TABLET THREE TIMES DAILY (MAY TAKE AN ADDITIONAL TABLET MID-DAY OR BEFORE BED AS NEEDED FOR MUSCLE SPASMS)     Not Delegated - Analgesics:  Muscle Relaxants Failed - 07/04/2019 12:37 PM      Failed - This refill cannot be delegated      Passed - Valid encounter within last 6 months    Recent Outpatient Visits          2 months ago Localized osteoporosis without current pathological fracture   [facility] Lilia Argue, MD   8 months ago Essential hypertension   [facility], Lilia Argue, MD   11 months ago History of embolic stroke   [facility], Lilia Argue, MD   1 year ago Medication monitoring encounter   [facility], Lilia Argue, MD   1 year ago Hemiplegia of dominant side as late effect following cerebrovascular disease [facility])   [facility], Lilia Argue, MD      Future Appointments            In 3 months Rutherford Guys, MD Primary Care at [place], [facility]           . digoxin (LANOXIN) 0.125 MG tablet [Pharmacy Med Name: DIGOXIN 125 MCG Tablet] 90 tablet 1    Sig: TAKE 1 TABLET (125 MCG TOTAL) BY MOUTH DAILY.     Cardiovascular:  Antiarrhythmic Agents - digoxin Passed - 07/04/2019 12:37 PM      Passed - Cr in normal range and within 180 days    Creat  Date Value Ref Range Status  09/30/2014 0.83 0.50 - 0.99 mg/dL Final   Creatinine, Ser  Date Value Ref Range Status  04/09/2019 0.83 0.57 - 1.00 mg/dL Final         Passed - Digoxin (serum) in normal range and within 180 days    Digoxin Level  Date Value Ref Range Status  09/30/2014 1.1 0.8 - 2.0 ug/L Final    Comment:    ** Please note change in unit of measure. ** FAB Anti-Digoxin (Digibind(R)) in serum/plasma of patients under toxicity therapy may interfere with the digoxin  immunoassay.          Passed - Ca in normal range and within 180 days    Calcium  Date Value Ref Range Status  04/09/2019 9.3 8.7 - 10.3 mg/dL Final   Calcium, Ion  Date Value Ref Range Status  09/12/2018 1.15 1.15 - 1.40 mmol/L Final         Passed - K in normal range and within 180 days    Potassium  Date Value Ref Range Status  04/09/2019 4.0 3.5 - 5.2 mmol/L Final         Passed - Last Heart Rate in normal range    Pulse Readings from Last 1 Encounters:  05/26/19 72         Passed - Valid encounter within last 6 months    Recent Outpatient Visits          2 months ago Localized osteoporosis without current pathological fracture   [facility], Lilia Argue, MD   8 months ago Essential hypertension   [facility], Lilia Argue, MD   11 months ago  History of embolic stroke   [facility], Lilia Argue, MD   1 year ago Medication monitoring encounter   [facility], Lilia Argue, MD   1 year ago Hemiplegia of dominant side as late effect following cerebrovascular disease [facility])   [facility], Lilia Argue, MD      Future Appointments            In 3 months Rutherford Guys, MD Primary Care at [place], [facility]

## 2019-07-04 NOTE — Telephone Encounter (Signed)
Requested medication (s) are due for refill today: yes  Requested medication (s) are on the active medication list: yes  Last refill:  06/09/18  Future visit scheduled: yes  Notes to clinic:  med not delegated   Requested Prescriptions  Pending Prescriptions Disp Refills   baclofen (LIORESAL) 20 MG tablet [Pharmacy Med Name: BACLOFEN 20 MG Tablet] 120 tablet     Sig: TAKE 1 TABLET THREE TIMES DAILY (MAY TAKE AN ADDITIONAL TABLET MID-DAY OR BEFORE BED AS NEEDED FOR MUSCLE SPASMS)      Not Delegated - Analgesics:  Muscle Relaxants Failed - 07/04/2019 12:37 PM      Failed - This refill cannot be delegated      Passed - Valid encounter within last 6 months    Recent Outpatient Visits           2 months ago Localized osteoporosis without current pathological fracture   [facility], Lilia Argue, MD   8 months ago Essential hypertension   [facility], Lilia Argue, MD   11 months ago History of embolic stroke   [facility], Lilia Argue, MD   1 year ago Medication monitoring encounter   [facility], Lilia Argue, MD   1 year ago Hemiplegia of dominant side as late effect following cerebrovascular disease [facility])   [facility], Lilia Argue, MD       Future Appointments             In 3 months Rutherford Guys, MD Primary Care at [place], [facility]             Signed Prescriptions Disp Refills   digoxin (LANOXIN) 0.125 MG tablet 90 tablet 1    Sig: TAKE 1 TABLET (125 MCG TOTAL) BY MOUTH DAILY.      Cardiovascular:  Antiarrhythmic Agents - digoxin Passed - 07/04/2019 12:37 PM      Passed - Cr in normal range and within 180 days    Creat  Date Value Ref Range Status  09/30/2014 0.83 0.50 - 0.99 mg/dL Final   Creatinine, Ser  Date Value Ref Range Status  04/09/2019 0.83 0.57 - 1.00 mg/dL Final          Passed - Digoxin (serum) in normal range and within 180 days    Digoxin Level  Date Value Ref Range  Status  09/30/2014 1.1 0.8 - 2.0 ug/L Final    Comment:    ** Please note change in unit of measure. ** FAB Anti-Digoxin (Digibind(R)) in serum/plasma of patients under toxicity therapy may interfere with the digoxin immunoassay.           Passed - Ca in normal range and within 180 days    Calcium  Date Value Ref Range Status  04/09/2019 9.3 8.7 - 10.3 mg/dL Final   Calcium, Ion  Date Value Ref Range Status  09/12/2018 1.15 1.15 - 1.40 mmol/L Final          Passed - K in normal range and within 180 days    Potassium  Date Value Ref Range Status  04/09/2019 4.0 3.5 - 5.2 mmol/L Final          Passed - Last Heart Rate in normal range    Pulse Readings from Last 1 Encounters:  05/26/19 72          Passed - Valid encounter within last 6 months    Recent Outpatient Visits  2 months ago Localized osteoporosis without current pathological fracture   [facility], Lilia Argue, MD   8 months ago Essential hypertension   [facility], Lilia Argue, MD   11 months ago History of embolic stroke   [facility], Lilia Argue, MD   1 year ago Medication monitoring encounter   [facility], Lilia Argue, MD   1 year ago Hemiplegia of dominant side as late effect following cerebrovascular disease [facility])   [facility], Lilia Argue, MD       Future Appointments             In 3 months Rutherford Guys, MD Primary Care at [place], [facility]

## 2019-07-07 ENCOUNTER — Other Ambulatory Visit: Payer: Self-pay

## 2019-07-07 DIAGNOSIS — I69959 Hemiplegia and hemiparesis following unspecified cerebrovascular disease affecting unspecified side: Secondary | ICD-10-CM

## 2019-07-07 DIAGNOSIS — I10 Essential (primary) hypertension: Secondary | ICD-10-CM

## 2019-07-07 DIAGNOSIS — Z8673 Personal history of transient ischemic attack (TIA), and cerebral infarction without residual deficits: Secondary | ICD-10-CM

## 2019-07-07 DIAGNOSIS — I429 Cardiomyopathy, unspecified: Secondary | ICD-10-CM

## 2019-07-07 DIAGNOSIS — I5042 Chronic combined systolic (congestive) and diastolic (congestive) heart failure: Secondary | ICD-10-CM

## 2019-07-07 MED ORDER — CARVEDILOL 6.25 MG PO TABS: 6.2500 mg | ORAL_TABLET | Freq: Two times a day (BID) | ORAL | 0 refills | Status: DC

## 2019-07-21 ENCOUNTER — Other Ambulatory Visit: Payer: Self-pay

## 2019-07-21 ENCOUNTER — Telehealth (INDEPENDENT_AMBULATORY_CARE_PROVIDER_SITE_OTHER): Payer: Medicare HMO | Admitting: Family Medicine

## 2019-07-21 ENCOUNTER — Encounter: Payer: Self-pay | Admitting: Family Medicine

## 2019-07-21 DIAGNOSIS — K089 Disorder of teeth and supporting structures, unspecified: Secondary | ICD-10-CM

## 2019-07-21 DIAGNOSIS — Z8673 Personal history of transient ischemic attack (TIA), and cerebral infarction without residual deficits: Secondary | ICD-10-CM

## 2019-07-21 DIAGNOSIS — R634 Abnormal weight loss: Secondary | ICD-10-CM | POA: Diagnosis not present

## 2019-07-21 NOTE — Progress Notes (Signed)
Virtual Visit Note  I connected with patient on 07/21/19 at 605pm by phone due to technical difficulties and verified that I am speaking with the correct person using two identifiers. [name] is currently located at home and patient is currently with them during visit. The provider, Rutherford Guys, MD is located in their office at time of visit.  I discussed the limitations, risks, security and privacy concerns of performing an evaluation and management service by telephone and the availability of in person appointments. I also discussed with the patient that there may be a patient responsible charge related to this service. The patient expressed understanding and agreed to proceed.   I provided 10 minutes of non-face-to-face time during this encounter.  Chief Complaint  Patient presents with  . Dental Problem     has been told that she needs to have her teeth removed. has concerned based on the type of food pt eats. Wants to make sure this is the right thing to do and to make sure this is a medical procedure that insurance will pay for    HPI ? PMH: embolic CVA sequela, on coumadin, seizures, HTN with sCHF on digoxin, HLP, sickle cell anemia, vitamin D deficiency, osteoporosis with h/o right hip fracture   Dr Altamese [place], dentist states that she needs to have all her teeth pulled as she needs denture. She was referred her to oral surgeon whom would be able to do extractions, unsure if under local anesthesia or general  She is currently having problems with eating and has been losing weight, she has been eating mostly soft pureed food  dysphagia 3 diet recommendation hosp for seizure in aug 2020  She has not had any problems with recurring abscess She does have an occasional tooth ache  [employer] does not cover extractions, wondering if letter of medical necessity can be provided  History provided by husband as patient has expressive aphasia   Wt Readings from Last 3 Encounters:   04/09/19 127 lb (57.6 kg)  12/24/18 128 lb (58.1 kg)  12/01/18 139 lb (63 kg)    Allergies  Allergen Reactions  . Lexiscan [Regadenoson] Other (See Comments)    Perioral tingling and throat tightness    Prior to Admission medications   Medication Sig Start Date End Date Taking? Authorizing Provider  alendronate (FOSAMAX) 70 MG tablet Take with a full glass of water on an empty stomach. 06/09/19  Yes Rutherford Guys, MD  atorvastatin (LIPITOR) 80 MG tablet TAKE 1 TABLET AT BEDTIME 07/04/19  Yes Rutherford Guys, MD  baclofen (LIORESAL) 20 MG tablet TAKE 1 TABLET THREE TIMES DAILY (MAY TAKE AN ADDITIONAL TABLET MID-DAY OR BEFORE BED AS NEEDED FOR MUSCLE SPASMS) 07/06/19  Yes Rutherford Guys, MD  carvedilol (COREG) 6.25 MG tablet Take 1 tablet (6.25 mg total) by mouth 2 (two) times daily. 07/07/19  Yes Rutherford Guys, MD  digoxin (LANOXIN) 0.125 MG tablet TAKE 1 TABLET (125 MCG TOTAL) BY MOUTH DAILY. 07/04/19  Yes Rutherford Guys, MD  ENTRESTO 24-26 MG TAKE 1 TABLET TWICE DAILY 01/30/19  Yes Skeet Latch, MD  furosemide (LASIX) 40 MG tablet Take 1 tablet (40 mg total) by mouth daily. 02/26/19  Yes Rutherford Guys, MD  Lacosamide 100 MG TABS Take 1 tablet (100 mg total) by mouth 2 (two) times daily. 12/24/18  Yes Penumalli, Earlean Polka, MD  levETIRAcetam (KEPPRA) 750 MG tablet Take 2 tablets (1,500 mg total) by mouth 2 (two) times daily. 12/24/18  03/18/20 Yes Penumalli, Earlean Polka, MD  nystatin (NYSTATIN) powder Apply topically daily. Apply under breasts once daily after bath 01/02/19  Yes Rutherford Guys, MD  polyethylene glycol [facility] / GLYCOLAX) packet Take 17 g by mouth daily as needed for moderate constipation.   Yes [provider]  spironolactone (ALDACTONE) 25 MG tablet TAKE 1/2 TABLET BY MOUTH DAILY 05/26/19  Yes Sande Rives E, PA-C  triamcinolone cream (KENALOG) 0.1 % Apply 1 application topically 2 (two) times daily. 03/26/14  Yes [name], MD  Vitamin D, Ergocalciferol,  (DRISDOL) 1.25 MG (50000 UT) CAPS capsule TAKE 1 CAPSULE EVERY 7 DAYS 12/16/18  Yes Rutherford Guys, MD  warfarin (COUMADIN) 5 MG tablet TAKE AS DIRECTED BY COUMADIN CLINIC. NEED OFFICE VISIT WITH FASTING LABS FOR REFILLS Patient taking differently: Take 5 mg by mouth one time only at 6 PM. Take 1 tablet (5mg ) po daily at 6PM. 09/03/18  Yes Rutherford Guys, MD    Past Medical History:  Diagnosis Date  . Adenomatous polyp of colon 09/2012   repeat colonoscopy in 5 years by Dr. Sharlett Iles  . CHF (congestive heart failure) (HCC)    EF 15-20% as of 05/28/11 (Dr Legrand [facility] summary)  . CVA (cerebral infarction) 12/2007  . Hemiparesis ([place])   . Hyperlipidemia   . Hypertension   . Seizures ([place])   . Sickle cell anemia (HCC)   . Stroke ([place])   . Vitamin D deficiency 2010    Past Surgical History:  Procedure Laterality Date  . ABDOMINAL HYSTERECTOMY    . FRACTURE SURGERY    . HIP SURGERY    . TUBAL LIGATION      Social History   Tobacco Use  . Smoking status: Former Smoker    Types: Cigarettes    Quit date: 09/26/2007    Years since quitting: 11.8  . Smokeless tobacco: Never Used  Substance Use Topics  . Alcohol use: No    Family History  Problem Relation Age of Onset  . Diabetes Daughter   . Colon cancer Maternal Grandfather     ROS Per hpi  Objective  Vitals as reported by the patient:  none   ASSESSMENT and PLAN  1. Poor dentition 2. Loss of weight 3. History of CVA (cerebrovascular accident)  I have asked husband to provide me with name of oral surgeon in order to discuss treatment plan. If under general anesthesia would need cards and neuro preop eval. Given weight loss extractions would be considered medically necessary as current need for pureed foods is not due to dysphagia but poor dentition  FOLLOW-UP: prn   The above assessment and management plan was discussed with the patient. The patient verbalized understanding of and has agreed to the  management plan. Patient is aware to call the clinic if symptoms persist or worsen. Patient is aware when to return to the clinic for a follow-up visit. Patient educated on when it is appropriate to go to the emergency department.     Rutherford Guys, MD Primary Care at [place], [place] [address] Ph.  [phone] Fax [phone]

## 2019-08-04 ENCOUNTER — Ambulatory Visit
Admission: RE | Admit: 2019-08-04 | Discharge: 2019-08-04 | Disposition: A | Discharge status: Home / Self Care | Payer: Medicare HMO | Source: Ambulatory Visit | Attending: Family Medicine | Admitting: Family Medicine

## 2019-08-04 ENCOUNTER — Ambulatory Visit
Admission: RE | Admit: 2019-08-04 | Discharge: 2019-08-04 | Disposition: A | Discharge status: Home / Self Care | Payer: Medicare HMO | Source: Ambulatory Visit | Attending: Internal Medicine | Admitting: Internal Medicine

## 2019-08-04 ENCOUNTER — Other Ambulatory Visit: Payer: Self-pay

## 2019-08-04 DIAGNOSIS — M85852 Other specified disorders of bone density and structure, left thigh: Secondary | ICD-10-CM | POA: Diagnosis not present

## 2019-08-04 DIAGNOSIS — Z1231 Encounter for screening mammogram for malignant neoplasm of breast: Secondary | ICD-10-CM | POA: Diagnosis not present

## 2019-08-04 DIAGNOSIS — M816 Localized osteoporosis [Lequesne]: Secondary | ICD-10-CM

## 2019-08-04 DIAGNOSIS — Z780 Asymptomatic menopausal state: Secondary | ICD-10-CM | POA: Diagnosis not present

## 2019-08-07 ENCOUNTER — Other Ambulatory Visit: Payer: Self-pay | Admitting: Internal Medicine

## 2019-08-07 DIAGNOSIS — R928 Other abnormal and inconclusive findings on diagnostic imaging of breast: Secondary | ICD-10-CM

## 2019-08-11 ENCOUNTER — Encounter: Payer: Self-pay | Admitting: Family Medicine

## 2019-08-19 DIAGNOSIS — Z7901 Long term (current) use of anticoagulants: Secondary | ICD-10-CM | POA: Diagnosis not present

## 2019-08-19 DIAGNOSIS — I639 Cerebral infarction, unspecified: Secondary | ICD-10-CM | POA: Diagnosis not present

## 2019-09-02 ENCOUNTER — Telehealth: Payer: Self-pay

## 2019-09-02 NOTE — Telephone Encounter (Signed)
Per Ward Givens, NP I may give patient samples of Vimpat 100 mg. I called husband and advised him I have 3 boxes in bag labeled for her. He will pick up tomorrow, verbalized understanding, appreciation.

## 2019-09-02 NOTE — Telephone Encounter (Signed)
Patients husband called stating that the patient only has 2 days left of her Vimpat. When he called [employer] they told him that they were waiting on authorization from our office. He is wondering if we have any samples that she can have to tide her over until the mail order comes in? If not, they need a supply sent to the local [facility].

## 2019-09-03 ENCOUNTER — Other Ambulatory Visit: Payer: Self-pay | Admitting: Neurology

## 2019-09-03 MED ORDER — LACOSAMIDE 100 MG PO TABS: 100.0000 mg | ORAL_TABLET | Freq: Two times a day (BID) | ORAL | 4 refills | Status: DC

## 2019-09-03 NOTE — Telephone Encounter (Signed)
Husband came and picked up samples of Vimpat for wife.

## 2019-09-03 NOTE — Telephone Encounter (Signed)
Ok I will refill

## 2019-09-14 ENCOUNTER — Ambulatory Visit: Payer: Medicare HMO

## 2019-09-14 ENCOUNTER — Other Ambulatory Visit: Payer: Self-pay

## 2019-09-14 ENCOUNTER — Ambulatory Visit
Admission: RE | Admit: 2019-09-14 | Discharge: 2019-09-14 | Disposition: A | Discharge status: Home / Self Care | Payer: Medicare HMO | Source: Ambulatory Visit | Attending: Internal Medicine | Admitting: Internal Medicine

## 2019-09-14 DIAGNOSIS — R922 Inconclusive mammogram: Secondary | ICD-10-CM | POA: Diagnosis not present

## 2019-09-14 DIAGNOSIS — R928 Other abnormal and inconclusive findings on diagnostic imaging of breast: Secondary | ICD-10-CM

## 2019-09-22 ENCOUNTER — Telehealth: Payer: Self-pay | Admitting: *Deleted

## 2019-09-22 NOTE — Telephone Encounter (Signed)
Pt husband ,Parks [place]ptune [place] ask if Pt could get Vimpat until everything is resolved with [employer]. Mr. [place] would like a call back from the nurse.

## 2019-09-22 NOTE — Telephone Encounter (Addendum)
Vimpat PA on CMM, key:  [id number], [id number]. Message received: Available without authorization. Will call [place], [phone]. EOC ID: [id number]  Called [employer] clinical line, spoke with Enid Baas who stated this is not for PA, but for copay cost reduction, stated we got form. I advised I only received cover page. He will fax form again.

## 2019-09-22 NOTE — Telephone Encounter (Signed)
Received fax with clinical questions. Called [employer] for clarification on questions. Vimpat tier exception form on MD desk for completion, signatures.

## 2019-09-23 NOTE — Telephone Encounter (Signed)
Vimpat PA clinical questionnaire completed, signed, faxed to [facility].

## 2019-09-23 NOTE — Telephone Encounter (Signed)
Called husband and advised him the PA was sent in this morning for tier cost reduction. I advised him I have two boxes of Vimpat 50 mg tab samples he can pick up at front desk today. I advised since she takes 100 mg twice daily he must give her two 50 mg tabs twice daily, repeated the instructions. He stated he'll come today, verbalized understanding, appreciation.

## 2019-09-24 NOTE — Telephone Encounter (Signed)
Called [employer] [facility] clinical review to check status of vimpat PA for tier reduction cost. Per [employer] Vimpat tier reduction denied; they will fax over denial letter.

## 2019-09-29 MED ORDER — LACOSAMIDE 100 MG PO TABS: 100.0000 mg | ORAL_TABLET | Freq: Two times a day (BID) | ORAL | 4 refills | Status: DC

## 2019-09-29 NOTE — Addendum Note (Signed)
Addended by: Minna Antis on: 09/29/2019 04:43 PM   Modules accepted: Orders

## 2019-09-29 NOTE — Telephone Encounter (Signed)
I have talked to patient's husband about Vimpat PAP assistance . I will fill out paper work patient and her husband will be here to sign application . Vimpat REP will be here 09/30/2019 to bring samples please provides samples process for approval takes 7-14 business days and we have to allow 3 days for shipping one approved. I think this patient has a good chance for approval.   I will need a printed RX I will but application on Tyler Aas desk for signature thanks Hinton Dyer.

## 2019-09-29 NOTE — Telephone Encounter (Signed)
Have not received fax re: vimpat denied. Almyra Free, spoke with clinical review provider line and requested the denial be faxed again, gave her 2 fax #s. She stated she would.  Received denial letter, placed on MD's desk for review.  Have routed call to [facility] to see if she qualifies for PAP.

## 2019-09-30 MED ORDER — LACOSAMIDE 100 MG PO TABS: 100.0000 mg | ORAL_TABLET | Freq: Two times a day (BID) | ORAL | 0 refills | Status: DC

## 2019-09-30 NOTE — Addendum Note (Signed)
Addended by: Minna Antis on: 09/30/2019 02:07 PM   Modules accepted: Orders

## 2019-09-30 NOTE — Telephone Encounter (Signed)
Vimpat Rep Came today for signature that was done . Vimpat sample are mailed to [place] Per Rep .Samples should be to the office not later thank Monday . Looking back [name] has proved samples more than once . To be on the safe maybe we should send a script to pharmacy  For two weeks .  Cost should not be that bad If you agree I can explain to patient when they come sign paper work at 3:00 today for patient assistance please advise ?

## 2019-09-30 NOTE — Telephone Encounter (Signed)
Meds ordered this encounter  Medications  . DISCONTD: Lacosamide 100 MG TABS    Sig: Take 1 tablet (100 mg total) by mouth 2 (two) times daily.    Dispense:  180 tablet    Refill:  4    For PAP  . Lacosamide 100 MG TABS    Sig: Take 1 tablet (100 mg total) by mouth 2 (two) times daily.    Dispense:  28 tablet    Refill:  0    2 weeks Rx until she gets PAP    Penni Bombard, MD 01/27/9731, 1:25 PM Certified in Neurology, Neurophysiology and Neuroimaging  [facility] [address] [place], [place] [address] [phone]

## 2019-09-30 NOTE — Addendum Note (Signed)
Addended by: Andrey Spearman R on: 09/30/2019 03:09 PM   Modules accepted: Orders

## 2019-10-05 ENCOUNTER — Telehealth: Payer: Self-pay | Admitting: Cardiovascular Disease

## 2019-10-05 ENCOUNTER — Other Ambulatory Visit: Payer: Self-pay | Admitting: Family Medicine

## 2019-10-05 DIAGNOSIS — I429 Cardiomyopathy, unspecified: Secondary | ICD-10-CM

## 2019-10-05 DIAGNOSIS — Z8673 Personal history of transient ischemic attack (TIA), and cerebral infarction without residual deficits: Secondary | ICD-10-CM

## 2019-10-05 DIAGNOSIS — I5042 Chronic combined systolic (congestive) and diastolic (congestive) heart failure: Secondary | ICD-10-CM

## 2019-10-05 NOTE — Telephone Encounter (Signed)
Pt's husband called and wanted to talk with Dr. Oval [name] or nurse due to the price of Entresto. Unable to purchase medication. Please call

## 2019-10-05 NOTE — Telephone Encounter (Signed)
Copy $450 every 90 days [employer] patient assistance number to call and she if she qualifies      He will call back tomorrow or Wednesday

## 2019-10-07 ENCOUNTER — Telehealth: Payer: Self-pay | Admitting: Cardiovascular Disease

## 2019-10-07 NOTE — Telephone Encounter (Signed)
Pt's husband was returning phone call

## 2019-10-08 ENCOUNTER — Ambulatory Visit (INDEPENDENT_AMBULATORY_CARE_PROVIDER_SITE_OTHER): Payer: Medicare HMO | Admitting: Family Medicine

## 2019-10-08 ENCOUNTER — Other Ambulatory Visit: Payer: Self-pay

## 2019-10-08 ENCOUNTER — Telehealth: Payer: Self-pay | Admitting: Diagnostic Neuroimaging

## 2019-10-08 ENCOUNTER — Encounter: Payer: Self-pay | Admitting: Family Medicine

## 2019-10-08 VITALS — BP 97/58 | HR 69 | Temp 98.0°F | Ht 67.0 in

## 2019-10-08 DIAGNOSIS — I5041 Acute combined systolic (congestive) and diastolic (congestive) heart failure: Secondary | ICD-10-CM | POA: Diagnosis not present

## 2019-10-08 DIAGNOSIS — Z5181 Encounter for therapeutic drug level monitoring: Secondary | ICD-10-CM | POA: Diagnosis not present

## 2019-10-08 DIAGNOSIS — E782 Mixed hyperlipidemia: Secondary | ICD-10-CM | POA: Diagnosis not present

## 2019-10-08 DIAGNOSIS — Z79899 Other long term (current) drug therapy: Secondary | ICD-10-CM

## 2019-10-08 DIAGNOSIS — Z7901 Long term (current) use of anticoagulants: Secondary | ICD-10-CM

## 2019-10-08 DIAGNOSIS — M816 Localized osteoporosis [Lequesne]: Secondary | ICD-10-CM | POA: Diagnosis not present

## 2019-10-08 DIAGNOSIS — I10 Essential (primary) hypertension: Secondary | ICD-10-CM

## 2019-10-08 DIAGNOSIS — E559 Vitamin D deficiency, unspecified: Secondary | ICD-10-CM

## 2019-10-08 DIAGNOSIS — Z8673 Personal history of transient ischemic attack (TIA), and cerebral infarction without residual deficits: Secondary | ICD-10-CM

## 2019-10-08 MED ORDER — ENTRESTO 24-26 MG PO TABS: 1.0000 | ORAL_TABLET | Freq: Two times a day (BID) | ORAL | 3 refills | Status: DC

## 2019-10-08 NOTE — Progress Notes (Signed)
9/16/202110:03 AM  [name] 02-17-1950, 69 y.o., female [id number]  Chief Complaint  Patient presents with  . Follow-up    6 month follow up for cc, Went to dexa scan and foot doctor. Has cologuard kit at home, will submit    HPI:   Patient is a 69 y.o. female with past medical history significant for embolic CVA sequela, on coumadin, seizures, HTN with sCHF on digoxin, HLP, sickle cell anemia, vitamin D deficiency, osteoporosis with h/orighthip fracture who presents today for routine followup  Comes in with husband who provides history due to aphasia Does not check BP at home Walks around the home, no fatigue or dizziness She has not had breakfast today but she did take her meds She is overall doing well They have no acute concerns today She will be undergoing another dental eval for possible complete extractions She continues to take weekly vitamin D Declines flu vaccine  BP Readings from Last 3 Encounters:  10/08/19 (!) 97/58  05/26/19 138/72  04/09/19 (!) 116/55   Wt Readings from Last 3 Encounters:  04/09/19 127 lb (57.6 kg)  12/24/18 128 lb (58.1 kg)  12/01/18 139 lb (63 kg)   dexa - July 2021 - osteopenia, low FRAX score mammo July/aug - birads 1 Last cards OV may 2021 - added spironolactone 12.26m daily, cont lasix, entresto, coreg and digoxin CAD with abnormal stress test - managing medically as not good intervention candidate, fu 6 months Last neuro June 2021 - started vimpat (lacosamide)  Last vitamin D Lab Results  Component Value Date   VD25OH 48.4 04/09/2019   Lab Results  Component Value Date   DIGOXIN 1.1 09/30/2014   Lab Results  Component Value Date   CREATININE 0.83 04/09/2019   BUN 11 04/09/2019   NA 145 (H) 04/09/2019   K 4.0 04/09/2019   CL 106 04/09/2019   CO2 20 04/09/2019   Lab Results  Component Value Date   CHOL 123 04/09/2019   HDL 40 04/09/2019   LDLCALC 63 04/09/2019   TRIG 109 04/09/2019   CHOLHDL 3.1  04/09/2019    Depression screen PHQ 2/9 04/09/2019 08/28/2017 08/07/2017  Decreased Interest 0 0 0  Down, Depressed, Hopeless 0 0 0  PHQ - 2 Score 0 0 0    Fall Risk  10/08/2019 04/09/2019 10/10/2018 08/28/2017 08/07/2017  Falls in the past year? - 0 0 No No  Number falls in past yr: 0 0 1 - -  Injury with Fall? 0 0 1 - -  Risk for fall due to : - Impaired balance/gait;Mental status change - - -     Allergies  Allergen Reactions  . Lexiscan [Regadenoson] Other (See Comments)    Perioral tingling and throat tightness    Prior to Admission medications   Medication Sig Start Date End Date Taking? Authorizing Provider  alendronate (FOSAMAX) 70 MG tablet Take with a full glass of water on an empty stomach. 06/09/19  Yes SRutherford Guys MD  atorvastatin (LIPITOR) 80 MG tablet TAKE 1 TABLET AT BEDTIME 07/04/19  Yes SRutherford Guys MD  baclofen (LIORESAL) 20 MG tablet TAKE 1 TABLET THREE TIMES DAILY (MAY TAKE AN ADDITIONAL TABLET MID-DAY OR BEFORE BED AS NEEDED FOR MUSCLE SPASMS) 07/06/19  Yes SRutherford Guys MD  carvedilol (COREG) 6.25 MG tablet TAKE 1 TABLET (6.25 MG TOTAL) BY MOUTH 2 (TWO) TIMES DAILY. 10/05/19  Yes SRutherford Guys MD  digoxin (LANOXIN) 0.125 MG tablet TAKE 1 TABLET (125  MCG TOTAL) BY MOUTH DAILY. 07/04/19  Yes Rutherford Guys, MD  ENTRESTO 24-26 MG TAKE 1 TABLET TWICE DAILY 01/30/19  Yes Skeet Latch, MD  furosemide (LASIX) 40 MG tablet Take 1 tablet (40 mg total) by mouth daily. 02/26/19  Yes Rutherford Guys, MD  Lacosamide 100 MG TABS Take 1 tablet (100 mg total) by mouth 2 (two) times daily. 09/30/19  Yes Penumalli, Earlean Polka, MD  levETIRAcetam (KEPPRA) 750 MG tablet Take 2 tablets (1,500 mg total) by mouth 2 (two) times daily. 12/24/18 03/18/20 Yes Penumalli, Earlean Polka, MD  polyethylene glycol (MIRALAX / GLYCOLAX) packet Take 17 g by mouth daily as needed for moderate constipation.   Yes [provider]  spironolactone (ALDACTONE) 25 MG tablet TAKE 1/2 TABLET BY  MOUTH DAILY 05/26/19  Yes Sarajane Jews, Callie E, PA-C  Vitamin D, Ergocalciferol, (DRISDOL) 1.25 MG (50000 UT) CAPS capsule TAKE 1 CAPSULE EVERY 7 DAYS 12/16/18  Yes Rutherford Guys, MD  warfarin (COUMADIN) 5 MG tablet TAKE AS DIRECTED BY COUMADIN CLINIC. NEED OFFICE VISIT WITH FASTING LABS FOR REFILLS Patient taking differently: Take 5 mg by mouth one time only at 6 PM. Take 1 tablet (84m) po daily at 6PM. 09/03/18  Yes SRutherford Guys MD    Past Medical History:  Diagnosis Date  . Adenomatous polyp of colon 09/2012   repeat colonoscopy in 5 years by Dr. PSharlett Iles . CHF (congestive heart failure) (HCC)    EF 15-20% as of 05/28/11 ([facility])  . CVA (cerebral infarction) 12/2007  . [place]ip[place]   . Hyperlipidemia   . Hypertension   . Seizures ([place]   . Sickle cell anemia (HCC)   . Stroke ([place]   . Vitamin D deficiency 2010    Past Surgical History:  Procedure Laterality Date  . ABDOMINAL HYSTERECTOMY    . FRACTURE SURGERY    . HIP SURGERY    . TUBAL LIGATION      Social History   Tobacco Use  . Smoking status: Former Smoker    Types: Cigarettes    Quit date: 09/26/2007    Years since quitting: 12.0  . Smokeless tobacco: Never Used  Substance Use Topics  . Alcohol use: No    Family History  Problem Relation Age of Onset  . Diabetes Daughter   . Colon cancer Maternal Grandfather     Review of Systems  Constitutional: Negative for chills and fever.  Respiratory: Negative for cough and shortness of breath.   Cardiovascular: Negative for chest pain, palpitations and leg swelling.  Gastrointestinal: Negative for abdominal pain, nausea and vomiting.  per hpi   OBJECTIVE:  Today's Vitals   10/08/19 0949  BP: (!) 97/58  Pulse: 69  Temp: 98 F (36.7 C)  SpO2: 97%  Height: _0  (1.702 m)   Body mass index is 19.89 kg/m.   Physical Exam Vitals and nursing note reviewed.  Constitutional:      Appearance: She is well-developed.   HENT:     Head: Normocephalic and atraumatic.     Mouth/Throat:     Pharynx: No oropharyngeal exudate.  Eyes:     General: No scleral icterus.    Extraocular Movements: Extraocular movements intact.     Conjunctiva/sclera: Conjunctivae normal.     Pupils: Pupils are equal, round, and reactive to light.  Cardiovascular:     Rate and Rhythm: Normal rate and regular rhythm.     Heart sounds: Normal heart sounds. No murmur  heard.  No friction rub. No gallop.   Pulmonary:     Effort: Pulmonary effort is normal.     Breath sounds: Normal breath sounds. No wheezing, rhonchi or rales.  Musculoskeletal:     Cervical back: Neck supple.  Skin:    General: Skin is warm and dry.  Neurological:     Mental Status: She is alert and oriented to person, place, and time.     No results found for this or any previous visit (from the past 24 hour(s)).  No results found.   ASSESSMENT and PLAN  1. Essential hypertension 2. Acute combined systolic and diastolic heart failure (HCC) Minimal hypotension. Asymptomatic. Monitor BP at home.  3. Encounter for monitoring digoxin therapy - Digoxin level  4. Vitamin D deficiency Complete rx, then start OTC D3  5. Localized osteoporosis without current pathological fracture dexa improved, ostepenia, low FRAX score, has been on fosamax for 5 years. Will dc.   6. Mixed hyperlipidemia Checking labs today, medications will be adjusted as needed. LDL goal < 70 - Lipid panel - CMP14+EGFR  7. History of CVA (cerebrovascular accident) 8. Anticoagulated on Coumadin With hemiparesis and aphasia as sequela. Managed by neuro - CBC  Return in about 6 months (around 04/06/2020).    Rutherford Guys, MD [facility] [place], [address] [phone] Ph.  [phone] Fax [phone]

## 2019-10-08 NOTE — Telephone Encounter (Signed)
Late entry... spoke with patients husband yesterday and he will pick up patient assistance papers 9/16 and return for them to be faxed

## 2019-10-08 NOTE — Telephone Encounter (Signed)
CMM is asking for a call re: the PA status on Vimpat.  When calling [phone] please use Reference Key: [id number]

## 2019-10-08 NOTE — Telephone Encounter (Signed)
See 9/13 phone note. 

## 2019-10-08 NOTE — Telephone Encounter (Signed)
Messaged CMM and advised they archive PA for Vimpat.

## 2019-10-08 NOTE — Patient Instructions (Addendum)
Please check BP daily. Goal 100-130/60-80. If remains low, less than 100/60, please reach out to cardiology.   Please complete prescription vitamin D. Once all pills taken, do not request anymore refills. Instead buy an over the counter supplement for Vitamin D3 2000 units. Take 1 daily.   If you have lab work done today you will be contacted with your lab results within the next 2 weeks.  If you have not heard from [place] then please contact us. The fastest way to get your results is to register for My Chart.   IF you received an x-ray today, you will receive an invoice from [facility]. Please contact [facility] at [phone] with questions or concerns regarding your invoice.   IF you received labwork today, you will receive an invoice from [place]. Please contact [facility] at [phone] with questions or concerns regarding your invoice.   Our billing staff will not be able to assist you with questions regarding bills from these companies.  You will be contacted with the lab results as soon as they are available. The fastest way to get your results is to activate your My Chart account. Instructions are located on the last page of this paperwork. If you have not heard from [place] regarding the results in 2 weeks, please contact this office.

## 2019-10-08 NOTE — Telephone Encounter (Signed)
[name] called from from [name] patient [phone] they have been trying to reach patient and her husband to set up her husband for her shipment . I called and talked to patient's husband and relayed to him that he had to call them because he had to set it up with them first.

## 2019-10-09 LAB — LIPID PANEL
Chol/HDL Ratio: 3.1 ratio (ref 0.0–4.4)
Cholesterol, Total: 120 mg/dL (ref 100–199)
HDL: 39 mg/dL — ABNORMAL LOW (ref >39)
LDL Chol Calc (NIH): 61 mg/dL (ref 0–99)
Triglycerides: 107 mg/dL (ref 0–149)
VLDL Cholesterol Cal: 20 mg/dL (ref 5–40)

## 2019-10-09 LAB — CMP14+EGFR
ALT: 13 IU/L (ref 0–32)
AST: 24 IU/L (ref 0–40)
Albumin/Globulin Ratio: 1.4 (ref 1.2–2.2)
Albumin: 4.2 g/dL (ref 3.8–4.8)
Alkaline Phosphatase: 161 IU/L — ABNORMAL HIGH (ref 44–121)
BUN/Creatinine Ratio: 17 (ref 12–28)
BUN: 16 mg/dL (ref 8–27)
Bilirubin Total: <0.2 mg/dL (ref 0.0–1.2)
CO2: 23 mmol/L (ref 20–29)
Calcium: 9.2 mg/dL (ref 8.7–10.3)
Chloride: 104 mmol/L (ref 96–106)
Creatinine, Ser: 0.93 mg/dL (ref 0.57–1.00)
GFR calc Af Amer: 72 mL/min/{1.73_m2} (ref >59)
GFR calc non Af Amer: 62 mL/min/{1.73_m2} (ref >59)
Globulin, Total: 3.1 g/dL (ref 1.5–4.5)
Glucose: 81 mg/dL (ref 65–99)
Potassium: 4.4 mmol/L (ref 3.5–5.2)
Sodium: 140 mmol/L (ref 134–144)
Total Protein: 7.3 g/dL (ref 6.0–8.5)

## 2019-10-09 LAB — CBC
Hematocrit: 36.9 % (ref 34.0–46.6)
Hemoglobin: 12.5 g/dL (ref 11.1–15.9)
MCH: 28.3 pg (ref 26.6–33.0)
MCHC: 33.9 g/dL (ref 31.5–35.7)
MCV: 84 fL (ref 79–97)
Platelets: 297 10*3/uL (ref 150–450)
RBC: 4.41 x10E6/uL (ref 3.77–5.28)
RDW: 13.2 % (ref 11.7–15.4)
WBC: 8.2 10*3/uL (ref 3.4–10.8)

## 2019-10-09 LAB — DIGOXIN LEVEL: Digoxin, Serum: 1.5 ng/mL — ABNORMAL HIGH (ref 0.5–0.9)

## 2019-10-12 NOTE — Addendum Note (Signed)
Addended by: Rutherford Guys on: 10/12/2019 02:46 PM   Modules accepted: Orders

## 2019-10-13 ENCOUNTER — Telehealth: Payer: Self-pay | Admitting: Cardiovascular Disease

## 2019-10-13 NOTE — Telephone Encounter (Signed)
Patient calling the office for samples of medication:   1.  What medication and dosage are you requesting samples for?sacubitril-valsartan (ENTRESTO) 24-26 MG  2.  Are you currently out of this medication? no

## 2019-10-13 NOTE — Telephone Encounter (Signed)
I am ok with giving her samples if we have any until we know whether she qualifies for patient assistance. It does look like her BP was soft at recent visit with PCP, so recommend she continue to monitor this at home.  Thank you!

## 2019-10-14 NOTE — Telephone Encounter (Signed)
Please see note regarding samples.

## 2019-10-16 ENCOUNTER — Telehealth: Payer: Self-pay | Admitting: Cardiovascular Disease

## 2019-10-16 ENCOUNTER — Telehealth: Payer: Self-pay | Admitting: *Deleted

## 2019-10-16 MED ORDER — ENTRESTO 24-26 MG PO TABS: 1.0000 | ORAL_TABLET | Freq: Two times a day (BID) | ORAL | 0 refills | Status: DC

## 2019-10-16 NOTE — Telephone Encounter (Signed)
Spoke to husband (ok per DPR)-aware no samples at this time.   He request 10 day supply sent to [facility] sent.  Also advised to call patient assistance to get update on status (sent last Friday) and would sent to primary nurse to make aware.   He verbalized understanding.

## 2019-10-16 NOTE — Telephone Encounter (Signed)
Spoke with patients husband and he faxed information, will fax provider portion today

## 2019-10-16 NOTE — Telephone Encounter (Signed)
Follow up:     Patient calling to check on the status of medication refill.

## 2019-10-16 NOTE — Telephone Encounter (Signed)
   [facility] Pre-operative Risk Assessment   Request for surgical clearance:  1. What type of surgery is being performed? Extraction of all remaining teeth    2. When is this surgery scheduled? TBD   3. What type of clearance is required (medical clearance vs. Pharmacy clearance to hold med vs. Both)? medical  4. Are there any medications that need to be held prior to surgery and how long? N/A   5. Practice name and name of physician performing surgery? [facility]   6. What is the office phone number? [phone]   7.   What is the office fax number? [phone]  8.   Anesthesia type (None, local, MAC, general) ? General in hospital/surgical center   [employer] 10/16/2019, 5:13 PM  _________________________________________________________________

## 2019-10-19 ENCOUNTER — Telehealth: Payer: Self-pay | Admitting: *Deleted

## 2019-10-19 NOTE — Telephone Encounter (Signed)
Please clarify how many teeth are being pulled.

## 2019-10-19 NOTE — Telephone Encounter (Signed)
Received surgical clearance form from [facility]: re: teeth extractions. Form placed on MD's desk.

## 2019-10-19 NOTE — Telephone Encounter (Signed)
Surgical clearance letter signed, faxed to the [place] with last office note as requested. Received confirmation of fax.

## 2019-10-20 NOTE — Telephone Encounter (Signed)
I think she should be bridged.  She had a large stroke and has multiple risk factors.

## 2019-10-20 NOTE — Telephone Encounter (Signed)
   Primary Cardiologist: Skeet Latch, MD  Chart reviewed as part of pre-operative protocol coverage. Patient was contacted 10/20/2019 in reference to pre-operative risk assessment for pending surgery as outlined below.  [name] was last seen on 05/26/2019 by Sande Rives PA-C.  Since that day, Jeneen Moore [name] has done well without chest pain or shortness of breath.  Therefore, based on ACC/AHA guidelines, the patient would be at acceptable risk for the planned procedure without further cardiovascular testing.   The patient was advised that if she develops new symptoms prior to surgery to contact our office to arrange for a follow-up visit, and she verbalized understanding.  I will route this recommendation to the requesting party via Epic fax function and remove from pre-op pool. Please call with questions.  Patient does not need SBE prophylaxis.  Clinical pharmacist to review Coumadin therapy.   [place], [place] 10/20/2019, 1:37 PM

## 2019-10-20 NOTE — Telephone Encounter (Signed)
As mentioned below, patient is cleared for the procedure, however will defer to her coumadin clinic to arrange lovenox bridge. According to her husband, her coumadin is neither managed by PCP or cardiology. He mentions O[facility] Section as the one responsible to manage her coumadin.

## 2019-10-20 NOTE — Telephone Encounter (Signed)
Patient may hold warfarin for 4 days prior to procedure.  Patient WILL need a lovenox bridge. I am unsure who manages patient's warfarin. Lovenox bridge should ideally be coordinated by provider who manages patients warfarin.

## 2019-10-20 NOTE — Telephone Encounter (Signed)
Spoke with oral surgeon and they are planning to extract 22 teeth.

## 2019-10-20 NOTE — Telephone Encounter (Signed)
Patient with diagnosis of embolic stroke on warfarin for anticoagulation.    Procedure: Extraction of all remaining teeth  (22) Date of procedure: TBD  Patient does not have hx of afib. On warfarin for embolic stroke. I will defer to Dr. Oval [name] as far as if patient would need lovenox bridge.

## 2019-10-20 NOTE — Telephone Encounter (Signed)
I have informed the patient's husband who is her primary caregiver, he will need to figure out the date of the surgery first and set up a visit with her coumadin clinic at least 6-7 before the date of the procedure.

## 2019-10-22 ENCOUNTER — Telehealth: Payer: Self-pay | Admitting: Cardiovascular Disease

## 2019-10-22 NOTE — Telephone Encounter (Signed)
Routed last office note ( 05/26/19) to [facility].  Spoke to [place] [facility] -  No labs . She is aware.

## 2019-10-22 NOTE — Telephone Encounter (Signed)
    [facility] called, she said they received clearance but they also need copy of most recent OV notes and lab results. She gave fax# [phone]

## 2019-10-23 ENCOUNTER — Other Ambulatory Visit: Payer: Self-pay | Admitting: Cardiovascular Disease

## 2019-10-23 NOTE — Telephone Encounter (Signed)
   *  STAT* If patient is at the pharmacy, call can be transferred to refill team.   1. Which medications need to be refilled? (please list name of each medication and dose if known) sacubitril-valsartan (ENTRESTO) 24-26 MG  2. Which pharmacy/location (including street and city if local pharmacy) is medication to be sent to? [facility] [phone] - [place], [place] - [address] AT [address]  3. Do they need a 30 day or 90 day supply? 10 days   Pt need another 10 days supply

## 2019-10-30 NOTE — Telephone Encounter (Signed)
Received approval letter from patient assistance, advised husband

## 2019-11-04 NOTE — Telephone Encounter (Signed)
Patient was approved for Vimpat for one year

## 2019-11-09 ENCOUNTER — Other Ambulatory Visit: Payer: Self-pay | Admitting: Student

## 2019-11-09 ENCOUNTER — Telehealth: Payer: Self-pay | Admitting: Family Medicine

## 2019-11-09 ENCOUNTER — Other Ambulatory Visit: Payer: Self-pay | Admitting: *Deleted

## 2019-11-09 DIAGNOSIS — M62838 Other muscle spasm: Secondary | ICD-10-CM

## 2019-11-09 MED ORDER — BACLOFEN 20 MG PO TABS: ORAL_TABLET | ORAL | 1 refills | Status: DC

## 2019-11-09 NOTE — Telephone Encounter (Incomplete)
Called to speak to patient, but husband states he talks for her. I checked HIPPA info and spouse Parks [place]). I advised him the medication Fosamax was discontinued at her last visit 10/08/2019 by Dr Pamella Pert

## 2019-11-09 NOTE — Telephone Encounter (Signed)
Pts husband is calling in stated that pt is needing a refill for these medications listed below. Pt had toc scheduled with [facility] for 12/14/19  alendronate (FOSAMAX) 70 MG tablet [[id number]  baclofen (LIORESAL) 20 MG tablet [[id number]]

## 2019-11-23 ENCOUNTER — Other Ambulatory Visit: Payer: Self-pay

## 2019-11-23 ENCOUNTER — Encounter: Payer: Self-pay | Admitting: Cardiovascular Disease

## 2019-11-23 ENCOUNTER — Ambulatory Visit: Payer: Medicare HMO | Admitting: Cardiovascular Disease

## 2019-11-23 VITALS — BP 148/65 | HR 60 | Ht 67.0 in | Wt 128.0 lb

## 2019-11-23 DIAGNOSIS — I5042 Chronic combined systolic (congestive) and diastolic (congestive) heart failure: Secondary | ICD-10-CM

## 2019-11-23 DIAGNOSIS — E782 Mixed hyperlipidemia: Secondary | ICD-10-CM

## 2019-11-23 DIAGNOSIS — I10 Essential (primary) hypertension: Secondary | ICD-10-CM | POA: Diagnosis not present

## 2019-11-23 DIAGNOSIS — Z5181 Encounter for therapeutic drug level monitoring: Secondary | ICD-10-CM | POA: Diagnosis not present

## 2019-11-23 DIAGNOSIS — I429 Cardiomyopathy, unspecified: Secondary | ICD-10-CM

## 2019-11-23 LAB — DIGOXIN LEVEL: Digoxin, Serum: 0.9 ng/mL (ref 0.5–0.9)

## 2019-11-23 NOTE — Patient Instructions (Signed)
Medication Instructions:  Your physician recommends that you continue on your current medications as directed. Please refer to the Current Medication list given to you today.  *If you need a refill on your cardiac medications before your next appointment, please call your pharmacy*  Lab Work: [place]  If you have labs (blood work) drawn today and your tests are completely normal, you will receive your results only by:  [place] (if you have MyChart) OR  A paper copy in the mail If you have any lab test that is abnormal or we need to change your treatment, we will call you to review the results.  Testing/Procedures: NONE   Follow-Up: At [facility], you and your health needs are our priority.  As part of our continuing mission to provide you with exceptional heart care, we have created designated Provider Care Teams.  These Care Teams include your primary Cardiologist (physician) and Advanced Practice Providers (APPs -  Physician Assistants and Nurse Practitioners) who all work together to provide you with the care you need, when you need it.  We recommend signing up for the patient portal called "MyChart".  Sign up information is provided on this After Visit Summary.  MyChart is used to connect with patients for Virtual Visits (Telemedicine).  Patients are able to view lab/test results, encounter notes, upcoming appointments, etc.  Non-urgent messages can be sent to your provider as well.   To learn more about what you can do with MyChart, go to [email or web address].    Your next appointment:   6 month(s) You will receive a reminder letter in the mail two months in advance. If you don't receive a letter, please call our office to schedule the follow-up appointment.  The format for your next appointment:   In Person  Provider:   You may see Skeet Latch, MD or one of the following Advanced Practice Providers on your designated Care Team:    Kerin Ransom,  PA-C  [place], [place]  Coletta Memos, [place]

## 2019-11-23 NOTE — Progress Notes (Signed)
Cardiology Office Note   Date:  11/23/2019   ID:  [name], [name] [birth year], MRN [id number]  PCP:  Valerie Guys, MD (Inactive)  Cardiologist:   Valerie Latch, MD   No chief complaint on file.   History of Present Illness: [name] is a 69 y.o. female with Sickle cell anemia, hypertension, hyperlipidemia, embolic stroke, expressive aphasia, and seizures who presents for follow up.  She was hospitalized 08/2018 for status epilepticus requiring intubation in Valerie setting of being out of her cardiac medication.  Hs troponin was elevated to 1905.  She had an echo that revealed LVEF 35-40% with moderate LVH and grade 1 diastolic dysfunction.  There was moderate hypokinesis of Valerie inferior and lateral myocardium.  There was also aortic valve sclerosis without stenosis.  She reported having chest pain during Valerie hospitalization, but by Valerie time cardiology evaluated her she didn't remember Valerie event.  She had a Lexiscan Myoview 09/20/18 that showed LVEF 34% with a large, severe mostly fixed defect in Valerie inferior, inferoapical, and inferolateral myocardium.  There was little reversible ischemia.    At her last appointment [name] was switched from losartan to [place].  She followed up with Valerie Rives, PA-C on 05/2019.  At that time she was doing well other than some episodes of mild dizziness and hypotension.  When she saw her PCP 09/2019 she was feeling well, though her blood pressure was low.  She had just taken her blood pressure medication at Valerie time.  Did not check her blood pressure at home.  Overall she has been feeling well.  Her husband provides most of Valerie history due to her aphasia.  They have no specific complaints.  She will need her teeth extracted, likely in January.  They have already received instructions for bridging warfarin with Lovenox.  Past Medical History:  Diagnosis Date  . Adenomatous polyp of colon 09/2012   repeat colonoscopy in 5 years by  Dr. Sharlett Iles  . CHF (congestive heart failure) (HCC)    EF 15-20% as of 05/28/11 (Dr [facility] summary)  . CVA (cerebral infarction) 12/2007  . Hemiparesis ([place])   . Hyperlipidemia   . Hypertension   . Seizures ([place])   . Sickle cell anemia (HCC)   . Stroke ([place])   . Vitamin D deficiency 2010    Past Surgical History:  Procedure Laterality Date  . ABDOMINAL HYSTERECTOMY    . FRACTURE SURGERY    . HIP SURGERY    . TUBAL LIGATION       Current Outpatient Medications  Medication Sig Dispense Refill  . atorvastatin (LIPITOR) 80 MG tablet TAKE 1 TABLET AT BEDTIME 90 tablet 2  . baclofen (LIORESAL) 20 MG tablet TAKE 1 TABLET THREE TIMES DAILY (MAY TAKE AN ADDITIONAL TABLET MID-DAY OR BEFORE BED AS NEEDED FOR MUSCLE SPASMS) 120 tablet 1  . carvedilol (COREG) 6.25 MG tablet TAKE 1 TABLET (6.25 MG TOTAL) BY MOUTH 2 (TWO) TIMES DAILY. 180 tablet 0  . Cholecalciferol 50 MCG (2000 UT) TABS Take by mouth.    . digoxin (LANOXIN) 0.125 MG tablet TAKE 1 TABLET (125 MCG TOTAL) BY MOUTH DAILY. 90 tablet 1  . furosemide (LASIX) 40 MG tablet Take 1 tablet (40 mg total) by mouth daily. 30 tablet 0  . Lacosamide 100 MG TABS Take 1 tablet (100 mg total) by mouth 2 (two) times daily. 28 tablet 0  . levETIRAcetam (KEPPRA) 750 MG tablet Take 2 tablets (1,500 mg  total) by mouth 2 (two) times daily. 360 tablet 4  . polyethylene glycol (MIRALAX / GLYCOLAX) packet Take 17 g by mouth daily as needed for moderate constipation.    . sacubitril-valsartan (ENTRESTO) 24-26 MG Take 1 tablet by mouth 2 (two) times daily. 20 tablet 0  . spironolactone (ALDACTONE) 25 MG tablet TAKE 1/2 TABLET EVERY DAY 45 tablet 1  . warfarin (COUMADIN) 5 MG tablet TAKE AS DIRECTED BY COUMADIN CLINIC. NEED OFFICE VISIT WITH FASTING LABS FOR REFILLS ([name] taking differently: Take 5 mg by mouth one time only at 6 PM. Take 1 tablet (5mg ) po daily at [facility].) 90 tablet 0   No current facility-administered medications for  this visit.    Allergies:   Lexiscan [regadenoson]    Social History:  [name]  reports that she quit smoking about 12 years ago. Her smoking use included cigarettes. She has never used smokeless tobacco. She reports that she does not drink alcohol and does not use drugs.   Family History:  [name]'s family history includes Colon cancer in her maternal grandfather; Diabetes in her daughter.    ROS:  Please see Valerie history of present illness.   Otherwise, review of systems are positive for none.   All other systems are reviewed and negative.    PHYSICAL EXAM: VS:  BP (!) 148/65   Pulse 60   Ht 5\' 7"  (1.702 m)   Wt 128 lb (58.1 kg)   SpO2 100%   BMI 20.05 kg/m  , BMI Body mass index is 20.05 kg/m. GENERAL:  Well appearing HEENT:  Pupils equal round and reactive, fundi not visualized, oral mucosa unremarkable NECK:  No jugular venous distention, waveform within normal limits, carotid upstroke brisk and symmetric, no bruits LUNGS:  Clear to auscultation bilaterally HEART:  RRR.  PMI not displaced or sustained,S1 and S2 within normal limits, no S3, no S4, no clicks, no rubs, no murmurs ABD:  Flat, positive bowel sounds normal in frequency in pitch, no bruits, no rebound, no guarding, no midline pulsatile mass, no hepatomegaly, no splenomegaly EXT:  2 plus pulses throughout, no edema, no cyanosis no clubbing SKIN:  No rashes no nodules NEURO:  R hemiparesis.   PSYCH:  Unable to assess.  Expressive aphasia.   EKG:  EKG is ordered today. Valerie ekg ordered 10/20/18 demonstrates sinus bradycardia.  Rate 55 bpm.  RBBB.  LAFB.  Inferolateral TWI. 11/23/2019: Sinus rhythm.  Rate 60 bpm.  Right bundle branch block.  Bifascicular block.  Cannot rule out LVH.  Inferolateral T wave inversions.  Echo 09/19/18: IMPRESSIONS   1. Valerie left ventricle has moderately reduced systolic function, with an ejection fraction of 35-40%. Valerie cavity size was normal. There is moderately increased left  ventricular wall thickness. Left ventricular diastolic Doppler parameters are consistent  with impaired relaxation. Elevated left atrial and left ventricular end-diastolic pressures Valerie E/e' is >20.  2. Moderate hypokinesis of Valerie left ventricular, entire inferior wall and lateral wall.  3. Valerie right ventricle has normal systolic function. Valerie cavity was normal. There is no increase in right ventricular wall thickness.  4. Left atrial size was mildly dilated.  5. Valerie mitral valve is grossly normal.  6. Valerie tricuspid valve is grossly normal.  7. Valerie aortic valve is tricuspid. Moderate sclerosis of Valerie aortic valve. Aortic valve regurgitation is moderate by color flow Doppler. No stenosis of Valerie aortic valve.  8. Valerie aorta is normal unless otherwise noted.  Lexiscan Myoview 09/20/18:  Downsloping ST segment depression ST segment depression of 0.5 mm was noted during stress in Valerie II, V4, V5 and V6 leads, and returning to baseline after 1-5 minutes of recovery.  Defect 1: There is a large defect of severe severity present in Valerie basal inferior, basal inferolateral, mid inferior, mid inferolateral, apical septal, apical inferior, apical lateral and apex location.  Findings consistent with prior myocardial infarction with peri-infarct ischemia.  This is a high risk study.  Nuclear stress EF: 34%.   Large size, severe severity mostly fixed (SDS 3) inferior, inferoapical and inferolateral perfusion defect suggestive of scar without significant peri-infarct ischemia. LVEF 34% with inferior, inferoapical and inferolateral akinesis. This is a high risk study due to low LVEF, however, no significant reversible ischemia is noted.   Recent Labs: 10/08/2019: ALT 13; BUN 16; Creatinine, Ser 0.93; Hemoglobin 12.5; Platelets 297; Potassium 4.4; Sodium 140    Lipid Panel    Component Value Date/Time   CHOL 120 10/08/2019 1031   TRIG 107 10/08/2019 1031   HDL 39 (L) 10/08/2019 1031   CHOLHDL 3.1  10/08/2019 1031   CHOLHDL 3.5 09/30/2014 0852   VLDL 19 09/30/2014 0852   LDLCALC 61 10/08/2019 1031      Wt Readings from Last 3 Encounters:  11/23/19 128 lb (58.1 kg)  04/09/19 127 lb (57.6 kg)  12/24/18 128 lb (58.1 kg)      ASSESSMENT AND PLAN:  # Chronic systolic and diastolic heart failure: LVEF 35-40% during hospitalization.  This increased from 13% previously.  Stress test was concerning for infarct with very mild peri-infarct ischemia.  She today continues to denies chest pain.  She would not be a good candidate for cardiac catheterization.  We will continue medical management with carvedilol. Entresto, and digoxin.  It is not entirely clear to me that she needs to remain on digoxin.  Levels were elevated when she saw her PCP.  However she had recently taken her medication.  She has not taken her meds yet this morning.  We will repeat a digoxin level.  Given that she has been on it for 10 years and has been stable we will not make any changes at this time.  Given that her blood pressure has been low after taking her medicines we will not add any additional medicine at this time.  # Embolic stroke:  Residual right hemiparesis and expressive aphasia.  Continue warfarin.  No known history of atrial fibrillation.  She will need to be bridged for her upcoming dental procedure.  This is already been discussed with our pharmacist.  # Hypertension: BP is elevated today.  However has been low at home and on several doctors appointments.  Therefore we will not make any changes.  Continue carvedilol, Entresto, and spironolactone.  # Hyperlipidemia: Continue atorvastatin.  Lipids are at goal.   Current medicines are reviewed at length with [name] today.  [name] does not have concerns regarding medicines.  Valerie following changes have been made:  none  Labs/ tests ordered today include:   Orders Placed This Encounter  Procedures  . Digoxin level  . EKG 12-Lead      Disposition:   FU with Mackenzey Crownover C. Oval [name], MD, [facility] in 6 months.    Signed, Jeanann Balinski C. Oval [name], MD, [facility]  11/23/2019 9:29 AM    [facility]

## 2019-11-25 DIAGNOSIS — Z7901 Long term (current) use of anticoagulants: Secondary | ICD-10-CM | POA: Diagnosis not present

## 2019-11-25 DIAGNOSIS — I639 Cerebral infarction, unspecified: Secondary | ICD-10-CM | POA: Diagnosis not present

## 2019-12-14 ENCOUNTER — Other Ambulatory Visit: Payer: Self-pay

## 2019-12-14 ENCOUNTER — Encounter: Payer: Self-pay | Admitting: Family Medicine

## 2019-12-14 ENCOUNTER — Ambulatory Visit (INDEPENDENT_AMBULATORY_CARE_PROVIDER_SITE_OTHER): Payer: Medicare HMO | Admitting: Family Medicine

## 2019-12-14 VITALS — BP 173/74 | HR 63 | Temp 99.0°F | Ht 67.0 in | Wt 127.0 lb

## 2019-12-14 DIAGNOSIS — G40909 Epilepsy, unspecified, not intractable, without status epilepticus: Secondary | ICD-10-CM

## 2019-12-14 DIAGNOSIS — Z8673 Personal history of transient ischemic attack (TIA), and cerebral infarction without residual deficits: Secondary | ICD-10-CM | POA: Diagnosis not present

## 2019-12-14 DIAGNOSIS — N39498 Other specified urinary incontinence: Secondary | ICD-10-CM

## 2019-12-14 DIAGNOSIS — I5042 Chronic combined systolic (congestive) and diastolic (congestive) heart failure: Secondary | ICD-10-CM | POA: Diagnosis not present

## 2019-12-14 DIAGNOSIS — I10 Essential (primary) hypertension: Secondary | ICD-10-CM | POA: Diagnosis not present

## 2019-12-14 DIAGNOSIS — E559 Vitamin D deficiency, unspecified: Secondary | ICD-10-CM | POA: Diagnosis not present

## 2019-12-14 DIAGNOSIS — K089 Disorder of teeth and supporting structures, unspecified: Secondary | ICD-10-CM

## 2019-12-14 DIAGNOSIS — E782 Mixed hyperlipidemia: Secondary | ICD-10-CM

## 2019-12-14 DIAGNOSIS — I69959 Hemiplegia and hemiparesis following unspecified cerebrovascular disease affecting unspecified side: Secondary | ICD-10-CM | POA: Diagnosis not present

## 2019-12-14 DIAGNOSIS — D699 Hemorrhagic condition, unspecified: Secondary | ICD-10-CM

## 2019-12-14 NOTE — Progress Notes (Signed)
11/22/202111:39 AM  [name] February 04, 1950, 69 y.o., female [id number]  Chief Complaint  Patient presents with  . transfer of care  . Dental Problem    swelling in gums , difficult eating , has been evaluated by dentist but not yet scheuled     HPI:   Patient is a 69 y.o. female with past medical history significant for CVA, seizures, HF, who presents today for transfer of care.  Patient Care Team: Rilei Kravitz, [name], FNP as PCP - General (Family Medicine) Skeet Latch, MD as PCP - Cardiology (Cardiology) Adrian Prows, MD as Attending Physician (Cardiology) Burnard Bunting, MD (Internal Medicine)  Essential hypertension BP Readings from Last 3 Encounters:  12/14/19 (!) 173/74  11/23/19 (!) 148/65  10/08/19 (!) 97/58  Had not taken Am medications, takes at 10am Doesn't check BP at home  Acute combined systolic and diastolic heart failure (HCC) Minimal hypotension. Asymptomatic. Monitor BP at home. Managed by Cardiology: f/u 6 months  monitoring digoxin therapy - Digoxin level: last 11/23/19: .9  Vitamin D deficiency Takes OTC D3 2000 IU per day Last vitamin D Lab Results  Component Value Date   VD25OH 48.4 04/09/2019    Localized osteoporosis without current pathological fracture dexa improved 7/21 ostepenia,. Lowest score -2.69m  low FRAX score,  had been on fosamax for 5 years, discontinued 07/2019  Mixed hyperlipidemia  LDL goal < 70 Lab Results  Component Value Date   CHOL 120 10/08/2019   HDL 39 (L) 10/08/2019   LDLCALC 61 10/08/2019   TRIG 107 10/08/2019   CHOLHDL 3.1 10/08/2019    History of CVA (cerebrovascular accident) and Anticoagulated on Coumadin, Seizures  With hemiparesis and aphasia as sequela. Managed by neuro every 6 months CVA in 2009, Will see neuro in December Dr. MSabra Heckmanages every 6 weeks Seizures last year in the hospital No seizure since that time Walks in the house with a can Right hand contracture and  right ankle brace Right leg paralyzed Aphasia  Teeth pain She had an evaluation Was supposed to have extractions Del[place] CGerald StabsDentist  Overnight incontinence Twice last week No issues during the day Will follow up  Yearly Mammograms   [facility] 12/14/2019 04/09/2019 08/28/2017  Decreased Interest 0 0 0  Down, Depressed, Hopeless 0 0 0  PHQ - 2 Score 0 0 0    Fall Risk  12/14/2019 10/08/2019 04/09/2019 10/10/2018 08/28/2017  Falls in the past year? 0 - 0 0 No  Number falls in past yr: 0 0 0 1 -  Injury with Fall? 0 0 0 1 -  Risk for fall due to : - - Impaired balance/gait;Mental status change - -  Follow up Falls evaluation completed - - - -     Allergies  Allergen Reactions  . Lexiscan [Regadenoson] Other (See Comments)    Perioral tingling and throat tightness    Prior to Admission medications   Medication Sig Start Date End Date Taking? Authorizing Provider  atorvastatin (LIPITOR) 80 MG tablet TAKE 1 TABLET AT BEDTIME 07/04/19  Yes SRutherford Guys MD  baclofen (LIORESAL) 20 MG tablet TAKE 1 TABLET THREE TIMES DAILY (MAY TAKE AN ADDITIONAL TABLET MID-DAY OR BEFORE BED AS NEEDED FOR MUSCLE SPASMS) 11/09/19  Yes MMaximiano Coss NP  carvedilol (COREG) 6.25 MG tablet TAKE 1 TABLET (6.25 MG TOTAL) BY MOUTH 2 (TWO) TIMES DAILY. 10/05/19  Yes SGrant FontanaM,  MD  Cholecalciferol 50 MCG (2000 UT) TABS Take by mouth.   Yes [provider]  digoxin (LANOXIN) 0.125 MG tablet TAKE 1 TABLET (125 MCG TOTAL) BY MOUTH DAILY. 07/04/19  Yes Rutherford Guys, MD  furosemide (LASIX) 40 MG tablet Take 1 tablet (40 mg total) by mouth daily. 02/26/19  Yes Rutherford Guys, MD  Lacosamide 100 MG TABS Take 1 tablet (100 mg total) by mouth 2 (two) times daily. 09/30/19  Yes Penumalli, Earlean Polka, MD  levETIRAcetam (KEPPRA) 750 MG tablet Take 2 tablets (1,500 mg total) by mouth 2 (two) times daily. 12/24/18 03/18/20  Yes Penumalli, Earlean Polka, MD  polyethylene glycol (MIRALAX / GLYCOLAX) packet Take 17 g by mouth daily as needed for moderate constipation.   Yes [provider]  sacubitril-valsartan (ENTRESTO) 24-26 MG Take 1 tablet by mouth 2 (two) times daily. 10/16/19  Yes Skeet Latch, MD  spironolactone (ALDACTONE) 25 MG tablet TAKE 1/2 TABLET EVERY DAY 11/10/19  Yes Skeet Latch, MD  warfarin (COUMADIN) 5 MG tablet TAKE AS DIRECTED BY COUMADIN CLINIC. NEED OFFICE VISIT WITH FASTING LABS FOR REFILLS Patient taking differently: Take 5 mg by mouth one time only at 6 PM. Take 1 tablet (47m) po daily at 6PM. 09/03/18  Yes SRutherford Guys MD    Past Medical History:  Diagnosis Date  . Adenomatous polyp of colon 09/2012   repeat colonoscopy in 5 years by Dr. PSharlett Iles . CHF (congestive heart failure) (HCC)    EF 15-20% as of 05/28/11 (Dr [facility] summary)  . CVA (cerebral infarction) 12/2007  . [place]ip[place]is ([place]   . Hyperlipidemia   . Hypertension   . Seizures ([place]   . Sickle cell anemia (HCC)   . Stroke ([place]   . Vitamin D deficiency 2010    Past Surgical History:  Procedure Laterality Date  . ABDOMINAL HYSTERECTOMY    . FRACTURE SURGERY    . HIP SURGERY    . TUBAL LIGATION      Social History   Tobacco Use  . Smoking status: Former Smoker    Types: Cigarettes    Quit date: 09/26/2007    Years since quitting: 12.2  . Smokeless tobacco: Never Used  Substance Use Topics  . Alcohol use: No    Family History  Problem Relation Age of Onset  . Diabetes Daughter   . Colon cancer Maternal Grandfather     Review of Systems  Constitutional: Negative for chills, fever and malaise/fatigue.  Eyes: Negative for blurred vision and double vision.  Respiratory: Negative for cough, shortness of breath and wheezing.   Cardiovascular: Negative for chest pain, palpitations and leg swelling.  Gastrointestinal: Positive for constipation. Negative for abdominal pain,  blood in stool, diarrhea, heartburn, nausea and vomiting.  Genitourinary: Negative for dysuria, frequency, hematuria and urgency.       Incontinence at night  Musculoskeletal: Negative for back pain and joint pain.  Skin: Negative for rash.  Neurological: Positive for focal weakness and weakness. Negative for dizziness, seizures and headaches.  Endo/Heme/Allergies: Bruises/bleeds easily.   OBJECTIVE:  Today's Vitals   12/14/19 0836  BP: (!) 173/74  Pulse: 63  Temp: 99 F (37.2 C)  SpO2: 97%  Weight: 127 lb (57.6 kg)  Height: '5\' 7"'  (1.702 m)   Body mass index is 19.89 kg/m.   Physical Exam Constitutional:      General: She is not in acute distress.    Appearance: Normal appearance. She is not ill-appearing.  HENT:     Head: Normocephalic.  Cardiovascular:     Rate and Rhythm: Normal rate and regular rhythm.     Pulses: Normal pulses.     Heart sounds: Normal heart sounds. No murmur heard.  No friction rub. No gallop.   Pulmonary:     Effort: Pulmonary effort is normal. No respiratory distress.     Breath sounds: Normal breath sounds. No stridor. No wheezing, rhonchi or rales.  Abdominal:     General: Bowel sounds are normal.     Palpations: Abdomen is soft.     Tenderness: There is no abdominal tenderness.  Musculoskeletal:     Right lower leg: No edema.     Left lower leg: No edema.     Comments: RLE paralysis, and Right hand contracture with brace  Skin:    General: Skin is warm and dry.  Neurological:     Mental Status: She is alert and oriented to person, place, and time. Mental status is at baseline.     Gait: Gait abnormal.     Comments: Aphasia  Psychiatric:        Mood and Affect: Mood normal.        Behavior: Behavior normal.     No results found for this or any previous visit (from the past 24 hour(s)).  No results found.   ASSESSMENT and PLAN  Problem List Items Addressed This Visit      Cardiovascular and Mediastinum   Chronic combined  systolic and diastolic heart failure ([place]) Managed by Cardiology     Nervous and Auditory   Seizure disorder ([place])   Hemiplegia of dominant side as late effect following cerebrovascular disease ([place]) Managed by Neurology     Other   History of CVA (cerebrovascular accident)   HLD (hyperlipidemia)   Vitamin D deficiency   Relevant Orders   Vitamin D, 25-hydroxy   Bleeding tendency ([place])    Other Visit Diagnoses    Essential hypertension    -  Primary   Relevant Orders   CMP14+EGFR Encouraged to take home medications daily and monitor BP at home. Will follow up.    Poor dentition     They will attempt to contact dentist Any issues encouraged to follow up   Other urinary incontinence     Will continue to monitor.     Will follow up with lab results.  Return in about 4 months (around 04/12/2020).    Huston Foley Ciria Bernardini, FNP-BC [facility] [place], [place] [address] Ph.  [phone] Fax [phone]

## 2019-12-14 NOTE — Patient Instructions (Addendum)
Call Dentist when able, let us know if you continue to have issues  Health Maintenance After Age 71 After age 81, you are at a higher risk for certain long-term diseases and infections as well as injuries from falls. Falls are a major cause of broken bones and head injuries in people who are older than age 71. Getting regular preventive care can help to keep you healthy and well. Preventive care includes getting regular testing and making lifestyle changes as recommended by your health care provider. Talk with your health care provider about:  Which screenings and tests you should have. A screening is a test that checks for a disease when you have no symptoms.  A diet and exercise plan that is right for you. What should I know about screenings and tests to prevent falls? Screening and testing are the best ways to find a health problem early. Early diagnosis and treatment give you the best chance of managing medical conditions that are common after age 71. Certain conditions and lifestyle choices may make you more likely to have a fall. Your health care provider may recommend:  Regular vision checks. Poor vision and conditions such as cataracts can make you more likely to have a fall. If you wear glasses, make sure to get your prescription updated if your vision changes.  Medicine review. Work with your health care provider to regularly review all of the medicines you are taking, including over-the-counter medicines. Ask your health care provider about any side effects that may make you more likely to have a fall. Tell your health care provider if any medicines that you take make you feel dizzy or sleepy.  Osteoporosis screening. Osteoporosis is a condition that causes the bones to get weaker. This can make the bones weak and cause them to break more easily.  Blood pressure screening. Blood pressure changes and medicines to control blood pressure can make you feel dizzy.  Strength and balance  checks. Your health care provider may recommend certain tests to check your strength and balance while standing, walking, or changing positions.  Foot health exam. Foot pain and numbness, as well as not wearing proper footwear, can make you more likely to have a fall.  Depression screening. You may be more likely to have a fall if you have a fear of falling, feel emotionally low, or feel unable to do activities that you used to do.  Alcohol use screening. Using too much alcohol can affect your balance and may make you more likely to have a fall. What actions can I take to lower my risk of falls? General instructions  Talk with your health care provider about your risks for falling. Tell your health care provider if: ? You fall. Be sure to tell your health care provider about all falls, even ones that seem minor. ? You feel dizzy, sleepy, or off-balance.  Take over-the-counter and prescription medicines only as told by your health care provider. These include any supplements.  Eat a healthy diet and maintain a healthy weight. A healthy diet includes low-fat dairy products, low-fat (lean) meats, and fiber from whole grains, beans, and lots of fruits and vegetables. Home safety  Remove any tripping hazards, such as rugs, cords, and clutter.  Install safety equipment such as grab bars in bathrooms and safety rails on stairs.  Keep rooms and walkways well-lit. Activity   Follow a regular exercise program to stay fit. This will help you maintain your balance. Ask your health care provider what  types of exercise are appropriate for you.  If you need a cane or walker, use it as recommended by your health care provider.  Wear supportive shoes that have nonskid soles. Lifestyle  Do not drink alcohol if your health care provider tells you not to drink.  If you drink alcohol, limit how much you have: ? 0-1 drink a day for women. ? 0-2 drinks a day for men.  Be aware of how much alcohol is  in your drink. In the [place] one drink equals one typical bottle of beer (12 oz), one-half glass of wine (5 oz), or one shot of hard liquor (1 oz).  Do not use any products that contain nicotine or tobacco, such as cigarettes and e-cigarettes. If you need help quitting, ask your health care provider. Summary  Having a healthy lifestyle and getting preventive care can help to protect your health and wellness after age 71.  Screening and testing are the best way to find a health problem early and help you avoid having a fall. Early diagnosis and treatment give you the best chance for managing medical conditions that are more common for people who are older than age 71.  Falls are a major cause of broken bones and head injuries in people who are older than age 71. Take precautions to prevent a fall at home.  Work with your health care provider to learn what changes you can make to improve your health and wellness and to prevent falls. This information is not intended to replace advice given to you by your health care provider. Make sure you discuss any questions you have with your health care provider. Document Revised: 05/01/2018 Document Reviewed: 11/21/2016 Elsevier Patient Education  [employer].   If you have lab work done today you will be contacted with your lab results within the next 2 weeks.  If you have not heard from [place] then please contact us. The fastest way to get your results is to register for My Chart.   IF you received an x-ray today, you will receive an invoice from [facility]. Please contact [facility] at [phone] with questions or concerns regarding your invoice.   IF you received labwork today, you will receive an invoice from [place]. Please contact [facility] at [phone] with questions or concerns regarding your invoice.   Our billing staff will not be able to assist you with questions regarding bills from these companies.  You will be  contacted with the lab results as soon as they are available. The fastest way to get your results is to activate your My Chart account. Instructions are located on the last page of this paperwork. If you have not heard from [place] regarding the results in 2 weeks, please contact this office.

## 2019-12-15 ENCOUNTER — Encounter: Payer: Self-pay | Admitting: Radiology

## 2019-12-15 LAB — CMP14+EGFR
ALT: 9 IU/L (ref 0–32)
AST: 24 IU/L (ref 0–40)
Albumin/Globulin Ratio: 1.3 (ref 1.2–2.2)
Albumin: 4.3 g/dL (ref 3.8–4.8)
Alkaline Phosphatase: 156 IU/L — ABNORMAL HIGH (ref 44–121)
BUN/Creatinine Ratio: 12 (ref 12–28)
BUN: 11 mg/dL (ref 8–27)
Bilirubin Total: 0.3 mg/dL (ref 0.0–1.2)
CO2: 22 mmol/L (ref 20–29)
Calcium: 9.2 mg/dL (ref 8.7–10.3)
Chloride: 106 mmol/L (ref 96–106)
Creatinine, Ser: 0.93 mg/dL (ref 0.57–1.00)
GFR calc Af Amer: 72 mL/min/{1.73_m2} (ref >59)
GFR calc non Af Amer: 62 mL/min/{1.73_m2} (ref >59)
Globulin, Total: 3.2 g/dL (ref 1.5–4.5)
Glucose: 83 mg/dL (ref 65–99)
Potassium: 4.2 mmol/L (ref 3.5–5.2)
Sodium: 144 mmol/L (ref 134–144)
Total Protein: 7.5 g/dL (ref 6.0–8.5)

## 2019-12-15 LAB — VITAMIN D 25 HYDROXY (VIT D DEFICIENCY, FRACTURES): Vit D, 25-Hydroxy: 47.8 ng/mL (ref 30.0–100.0)

## 2019-12-15 NOTE — Progress Notes (Signed)
If you could let her know her labs look great! No medication changes at this time.

## 2019-12-24 ENCOUNTER — Ambulatory Visit: Payer: Medicare HMO | Admitting: Family Medicine

## 2019-12-30 ENCOUNTER — Ambulatory Visit: Payer: Medicare HMO | Admitting: Diagnostic Neuroimaging

## 2019-12-30 ENCOUNTER — Encounter: Payer: Self-pay | Admitting: Diagnostic Neuroimaging

## 2019-12-30 VITALS — BP 116/68 | HR 62 | Ht 67.0 in | Wt 127.0 lb

## 2019-12-30 DIAGNOSIS — I63412 Cerebral infarction due to embolism of left middle cerebral artery: Secondary | ICD-10-CM | POA: Diagnosis not present

## 2019-12-30 DIAGNOSIS — G40909 Epilepsy, unspecified, not intractable, without status epilepticus: Secondary | ICD-10-CM | POA: Diagnosis not present

## 2019-12-30 DIAGNOSIS — I69959 Hemiplegia and hemiparesis following unspecified cerebrovascular disease affecting unspecified side: Secondary | ICD-10-CM | POA: Diagnosis not present

## 2019-12-30 MED ORDER — LEVETIRACETAM 750 MG PO TABS: 1500.0000 mg | ORAL_TABLET | Freq: Two times a day (BID) | ORAL | 4 refills | Status: DC

## 2019-12-30 MED ORDER — LACOSAMIDE 100 MG PO TABS: 100.0000 mg | ORAL_TABLET | Freq: Two times a day (BID) | ORAL | 12 refills | Status: DC

## 2019-12-30 NOTE — Progress Notes (Signed)
[name]  PATIENT: [name] DOB: 1950-08-15  REFERRING CLINICIAN: Pamella Pert, I HISTORY FROM: patient husband REASON FOR VISIT: follow up    HISTORICAL  CHIEF COMPLAINT:  Chief Complaint  Patient presents with  . Seizure disorder    rm 7, one year FU, husband- [name] "getting Vimpat through PAP, no seizures"     HISTORY OF PRESENT ILLNESS:   UPDATE (12/30/19, VRP): Since last visit, doing well. No seizures. Tolerating meds.    PRIOR HPI: 69 year old female here for evaluation of seizure.  History of left hemisphere stroke in 2009 with resultant right-sided hemiparesis.  Patient also had seizure disorder started around 2009.  Patient was doing well until August 2020 when patient had run out of antiseizure medications and had breakthrough seizures, brought to the hospital and treated.  Patient had multiple clinical and electrographic seizures in the hospital.  Patient was maintained on levetiracetam and Vimpat and dilantin; then Dilantin was tapered off at discharge.   Since then patient is stable.  Tolerating medications.   REVIEW OF SYSTEMS: Full 14 system review of systems performed and negative with exception of: As per HPI.  ALLERGIES: Allergies  Allergen Reactions  . Lexiscan [Regadenoson] Other (See Comments)    Perioral tingling and throat tightness    HOME MEDICATIONS: Outpatient Medications Prior to Visit  Medication Sig Dispense Refill  . atorvastatin (LIPITOR) 80 MG tablet TAKE 1 TABLET AT BEDTIME 90 tablet 2  . baclofen (LIORESAL) 20 MG tablet TAKE 1 TABLET THREE TIMES DAILY (MAY TAKE AN ADDITIONAL TABLET MID-DAY OR BEFORE BED AS NEEDED FOR MUSCLE SPASMS) 120 tablet 1  . carvedilol (COREG) 6.25 MG tablet TAKE 1 TABLET (6.25 MG TOTAL) BY MOUTH 2 (TWO) TIMES DAILY. 180 tablet 0  . Cholecalciferol 50 MCG (2000 UT) TABS Take by mouth.    . digoxin (LANOXIN) 0.125 MG tablet TAKE 1 TABLET (125 MCG TOTAL) BY MOUTH DAILY. 90 tablet 1   . furosemide (LASIX) 40 MG tablet Take 1 tablet (40 mg total) by mouth daily. 30 tablet 0  . Lacosamide 100 MG TABS Take 1 tablet (100 mg total) by mouth 2 (two) times daily. 28 tablet 0  . levETIRAcetam (KEPPRA) 750 MG tablet Take 2 tablets (1,500 mg total) by mouth 2 (two) times daily. 360 tablet 4  . polyethylene glycol (MIRALAX / GLYCOLAX) packet Take 17 g by mouth daily as needed for moderate constipation.    . sacubitril-valsartan (ENTRESTO) 24-26 MG Take 1 tablet by mouth 2 (two) times daily. 20 tablet 0  . spironolactone (ALDACTONE) 25 MG tablet TAKE 1/2 TABLET EVERY DAY 45 tablet 1  . warfarin (COUMADIN) 5 MG tablet TAKE AS DIRECTED BY COUMADIN CLINIC. NEED OFFICE VISIT WITH FASTING LABS FOR REFILLS (Patient taking differently: Take 5 mg by mouth one time only at 6 PM. Take 1 tablet (5mg ) po daily at [facility].) 90 tablet 0   No facility-administered medications prior to visit.    PAST MEDICAL HISTORY: Past Medical History:  Diagnosis Date  . Adenomatous polyp of colon 09/2012   repeat colonoscopy in 5 years by Dr. Sharlett Iles  . CHF (congestive heart failure) (HCC)    EF 15-20% as of 05/28/11 (Dr Legrand [facility] summary)  . CVA (cerebral infarction) 12/2007  . Hemiparesis ([place])   . Hyperlipidemia   . Hypertension   . Seizures ([place])   . Sickle cell anemia (HCC)   . Stroke ([place])   . Vitamin D deficiency 2010    PAST SURGICAL  HISTORY: Past Surgical History:  Procedure Laterality Date  . ABDOMINAL HYSTERECTOMY    . FRACTURE SURGERY    . HIP SURGERY    . TUBAL LIGATION      FAMILY HISTORY: Family History  Problem Relation Age of Onset  . Diabetes Daughter   . Colon cancer Maternal Grandfather     SOCIAL HISTORY: Social History   Socioeconomic History  . Marital status: Married    Spouse name: [name]  . Number of children: 2  . Years of education: 15  . Highest education level: Not on file  Occupational History    Employer: RETIRED  Tobacco Use  .  Smoking status: Former Smoker    Types: Cigarettes    Quit date: 09/26/2007    Years since quitting: 12.2  . Smokeless tobacco: Never Used  Substance and Sexual Activity  . Alcohol use: No  . Drug use: No  . Sexual activity: Not Currently  Other Topics Concern  . Not on file  Social History Narrative   Lives with husband   Social Determinants of Health   Financial Resource Strain:   . Difficulty of Paying Living Expenses: Not on file  Food Insecurity:   . Worried About Charity fundraiser in the Last Year: Not on file  . Ran Out of Food in the Last Year: Not on file  Transportation Needs:   . Lack of Transportation (Medical): Not on file  . Lack of Transportation (Non-Medical): Not on file  Physical Activity:   . Days of Exercise per Week: Not on file  . Minutes of Exercise per Session: Not on file  Stress:   . Feeling of Stress : Not on file  Social Connections:   . Frequency of Communication with Friends and Family: Not on file  . Frequency of Social Gatherings with Friends and Family: Not on file  . Attends Religious Services: Not on file  . Active Member of Clubs or Organizations: Not on file  . Attends Archivist Meetings: Not on file  . Marital Status: Not on file  Intimate Partner Violence:   . Fear of Current or Ex-Partner: Not on file  . Emotionally Abused: Not on file  . Physically Abused: Not on file  . Sexually Abused: Not on file     PHYSICAL EXAM  GENERAL EXAM/CONSTITUTIONAL: Vitals:  Vitals:   12/30/19 1433  BP: 116/68  Pulse: 62  Weight: 127 lb (57.6 kg)  Height: 5\' 7"  (1.702 m)   Body mass index is 19.89 kg/m. Wt Readings from Last 3 Encounters:  12/30/19 127 lb (57.6 kg)  12/14/19 127 lb (57.6 kg)  11/23/19 128 lb (58.1 kg)    Patient is in no distress; well developed, nourished and groomed; neck is supple  CARDIOVASCULAR:  Examination of carotid arteries is normal; no carotid bruits  Regular rate and rhythm, no  murmurs  Examination of peripheral vascular system by observation and palpation is normal  EYES:  Ophthalmoscopic exam of optic discs and posterior segments is normal; no papilledema or hemorrhages No exam data present  MUSCULOSKELETAL:  Gait, strength, tone, movements noted in Neurologic exam below  NEUROLOGIC: MENTAL STATUS:  No flowsheet data found.  awake, alert, oriented to person  [facility] memory   DECR attention and concentration  SEVERE EXPRESSIVE APHASIA; FOLLOWS SIMPLE COMMANDS  CRANIAL NERVE:   2nd - no papilledema on fundoscopic exam  2nd, 3rd, 4th, 6th - pupils equal and reactive to light, visual fields full to confrontation,  extraocular muscles intact, no nystagmus  5th - facial sensation symmetric  7th - facial strength --> DECR RIGHT LOWER STRENGTH  8th - hearing intact  9th - palate elevates symmetrically, uvula midline  11th - shoulder shrug symmetric  12th - tongue protrusion midline  MODERATE DYSARTHRIA  MOTOR:   INCREASED TONE IN RUE AND RLE; RUE 0; RLE 1-2  LUE / LLE 5  SENSORY:   normal and symmetric to light touch  COORDINATION:   finger-nose-finger, fine finger movements SLOW ON LEFT; CANNOT ON RIGHT DUE TO WEAKNESS  REFLEXES:   deep tendon reflexes BRISK ON RIGHT SIDE  GAIT/STATION:   IN WHEELCHAIR     DIAGNOSTIC DATA (LABS, IMAGING, TESTING) - I reviewed patient records, labs, notes, testing and imaging myself where available.  Lab Results  Component Value Date   WBC 8.2 10/08/2019   HGB 12.5 10/08/2019   HCT 36.9 10/08/2019   MCV 84 10/08/2019   PLT 297 10/08/2019      Component Value Date/Time   NA 144 12/14/2019 1020   K 4.2 12/14/2019 1020   CL 106 12/14/2019 1020   CO2 22 12/14/2019 1020   GLUCOSE 83 12/14/2019 1020   GLUCOSE 92 09/20/2018 0606   BUN 11 12/14/2019 1020   CREATININE 0.93 12/14/2019 1020   CREATININE 0.83 09/30/2014 0852   CALCIUM 9.2 12/14/2019 1020   PROT 7.5 12/14/2019 1020    ALBUMIN 4.3 12/14/2019 1020   AST 24 12/14/2019 1020   ALT 9 12/14/2019 1020   ALKPHOS 156 (H) 12/14/2019 1020   BILITOT 0.3 12/14/2019 1020   GFRNONAA 62 12/14/2019 1020   GFRNONAA 75 03/26/2014 1105   GFRAA 72 12/14/2019 1020   GFRAA 87 03/26/2014 1105   Lab Results  Component Value Date   CHOL 120 10/08/2019   HDL 39 (L) 10/08/2019   LDLCALC 61 10/08/2019   TRIG 107 10/08/2019   CHOLHDL 3.1 10/08/2019   Lab Results  Component Value Date   HGBA1C 5.9 (H) 09/13/2018   No results found for: VITAMINB12 Lab Results  Component Value Date   TSH 1.846 10/23/2013    09/12/18 MRI brain [I reviewed images myself and agree with interpretation. -VRP]  1. No acute intracranial abnormality. Remote left MCA distribution infarct. 2. No acute cervical spine fracture. Multilevel degenerative changes of the cervical spine as described above. 3. Endotracheal and transesophageal tubes are noted within their respective lumens. Small amount of airways secretions noted above the inflated endotracheal balloon.     ASSESSMENT AND PLAN  69 y.o. year old female here with:  Dx:  1. Seizure disorder ([place])   2. Cerebrovascular accident (CVA) due to embolism of left middle cerebral artery ([place])   3. Hemiplegia of dominant side as late effect following cerebrovascular disease (HCC)      PLAN:  SEIZURE DISORDER (post-stroke) - continue levetiracetam 1500mg  twice a day  - continue vimpat 100mg  twice a day   Meds ordered this encounter  Medications  . levETIRAcetam (KEPPRA) 750 MG tablet    Sig: Take 2 tablets (1,500 mg total) by mouth 2 (two) times daily.    Dispense:  360 tablet    Refill:  4  . Lacosamide 100 MG TABS    Sig: Take 1 tablet (100 mg total) by mouth 2 (two) times daily.    Dispense:  60 tablet    Refill:  12   Return in about 1 year (around 12/29/2020) for with NP (Amy Lomax), virtual visit (15 min).  Penni Bombard, MD 29/0/2111, 5:52 PM Certified in  Neurology, Neurophysiology and Neuroimaging  [facility] [address] [place], [place] [address] [phone]

## 2019-12-30 NOTE — Patient Instructions (Signed)
  SEIZURE DISORDER (post-stroke) - continue levetiracetam 1500mg  twice a day  - continue vimpat 100mg  twice a day

## 2020-01-06 DIAGNOSIS — Z7901 Long term (current) use of anticoagulants: Secondary | ICD-10-CM | POA: Diagnosis not present

## 2020-01-06 DIAGNOSIS — I639 Cerebral infarction, unspecified: Secondary | ICD-10-CM | POA: Diagnosis not present

## 2020-01-08 ENCOUNTER — Telehealth: Payer: Self-pay | Admitting: Family Medicine

## 2020-01-08 NOTE — Telephone Encounter (Signed)
Pt called and stated that [facility] regarding pts med refill on   digoxin (LANOXIN) 0.125 MG tablet [[id number]]   Pt will be out of medication in 10 days. Pt just had Toc appt on 11/22 with Kelsea Just  Please advise.

## 2020-01-11 ENCOUNTER — Other Ambulatory Visit: Payer: Self-pay | Admitting: Family Medicine

## 2020-01-11 DIAGNOSIS — I429 Cardiomyopathy, unspecified: Secondary | ICD-10-CM

## 2020-01-11 DIAGNOSIS — I5042 Chronic combined systolic (congestive) and diastolic (congestive) heart failure: Secondary | ICD-10-CM

## 2020-01-11 DIAGNOSIS — Z8673 Personal history of transient ischemic attack (TIA), and cerebral infarction without residual deficits: Secondary | ICD-10-CM

## 2020-01-11 MED ORDER — CARVEDILOL 6.25 MG PO TABS: 6.2500 mg | ORAL_TABLET | Freq: Two times a day (BID) | ORAL | 0 refills | Status: DC

## 2020-01-11 MED ORDER — DIGOXIN 125 MCG PO TABS: ORAL_TABLET | ORAL | 0 refills | Status: DC

## 2020-01-11 NOTE — Telephone Encounter (Signed)
completed

## 2020-01-11 NOTE — Telephone Encounter (Signed)
Medication:  digoxin (LANOXIN) 0.125 MG tablet [[id number]]  carvedilol (COREG) 6.25 MG tablet [[id number]]  Has the patient contacted their pharmacy? No  (Agent: If no, request that the patient contact the pharmacy for the refill.)   Preferred Pharmacy (with phone number or street name):  [facility] [place], [address] AT [address]  [place] [address]  Phone: [phone] Fax: [phone]    Agent: Please be advised that RX refills may take up to 3 business days. We ask that you follow-up with your pharmacy.

## 2020-01-12 ENCOUNTER — Telehealth: Payer: Self-pay | Admitting: Cardiovascular Disease

## 2020-01-12 NOTE — Telephone Encounter (Signed)
New Message  Pts husband is calling and says they have paperwork for the Subscriber to fill our for Entresto   Please call

## 2020-01-12 NOTE — Telephone Encounter (Signed)
Mr. [place] reports patient portion has been completed, will bring application by for Dr. Oval [name] to complete.

## 2020-01-13 ENCOUNTER — Other Ambulatory Visit: Payer: Self-pay | Admitting: Family Medicine

## 2020-01-13 DIAGNOSIS — M62838 Other muscle spasm: Secondary | ICD-10-CM

## 2020-01-13 MED ORDER — BACLOFEN 20 MG PO TABS: ORAL_TABLET | ORAL | 1 refills | Status: DC

## 2020-01-13 NOTE — Telephone Encounter (Signed)
Medication Refill - Medication: Baclofen   Has the patient contacted their pharmacy? Yes.   (Agent: If no, request that the patient contact the pharmacy for the refill.) (Agent: If yes, when and what did the pharmacy advise?)  Preferred Pharmacy (with phone number or street name):  [facility] [id number] - [place], [address] AT [address]  [place] [address]  Phone: [phone] Fax: [phone]  Hours: Not open 24 hours     Agent: Please be advised that RX refills may take up to 3 business days. We ask that you follow-up with your pharmacy.

## 2020-01-13 NOTE — Telephone Encounter (Signed)
Requested medication (s) are due for refill today - unsure  Requested medication (s) are on the active medication list -yes  Future visit scheduled -yes  Last refill: 11/09/19  Notes to clinic: Request non delegated Rx  Requested Prescriptions  Pending Prescriptions Disp Refills   baclofen (LIORESAL) 20 MG tablet 120 tablet 1    Sig: TAKE 1 TABLET THREE TIMES DAILY (MAY TAKE AN ADDITIONAL TABLET MID-DAY OR BEFORE BED AS NEEDED FOR MUSCLE SPASMS)      Not Delegated - Analgesics:  Muscle Relaxants Failed - 01/13/2020  8:58 AM      Failed - This refill cannot be delegated      Passed - Valid encounter within last 6 months    Recent Outpatient Visits           1 month ago Essential hypertension   [facility] at [place] [facility], [name], FNP   3 months ago Essential hypertension   Primary Care at [facility], Lilia Argue, MD   5 months ago Poor dentition   [facility] Care at [facility], Lilia Argue, MD   9 months ago Localized osteoporosis without current pathological fracture   Primary Care at [facility], Lilia Argue, MD   1 year ago Essential hypertension   [facility] Care at [facility], Lilia Argue, MD       Future Appointments             In 3 months Just, [name], FNP Primary Care at [place], [facility]   In 11 months Lomax, Amy, NP [name]                 Requested Prescriptions  Pending Prescriptions Disp Refills   baclofen (LIORESAL) 20 MG tablet 120 tablet 1    Sig: TAKE 1 TABLET THREE TIMES DAILY (MAY TAKE AN ADDITIONAL TABLET MID-DAY OR BEFORE BED AS NEEDED FOR MUSCLE SPASMS)      Not Delegated - Analgesics:  Muscle Relaxants Failed - 01/13/2020  8:58 AM      Failed - This refill cannot be delegated      Passed - Valid encounter within last 6 months    Recent Outpatient Visits           1 month ago Essential hypertension   [facility] at [place] [facility], [name], FNP   3 months ago Essential hypertension   Primary Care  at [facility], Lilia Argue, MD   5 months ago Poor dentition   Primary Care at [facility], Lilia Argue, MD   9 months ago Localized osteoporosis without current pathological fracture   Primary Care at [facility], Lilia Argue, MD   1 year ago Essential hypertension   Primary Care at [facility], Lilia Argue, MD       Future Appointments             In 3 months Just, [name], FNP Primary Care at [place], [facility]   In 11 months Debbora Presto, NP [facility]

## 2020-01-13 NOTE — Telephone Encounter (Signed)
Patient is requesting a refill of the following medications: Requested Prescriptions   Pending Prescriptions Disp Refills   baclofen (LIORESAL) 20 MG tablet 120 tablet 1    Sig: TAKE 1 TABLET THREE TIMES DAILY (MAY TAKE AN ADDITIONAL TABLET MID-DAY OR BEFORE BED AS NEEDED FOR MUSCLE SPASMS)    Date of patient request: 01/13/2020 Last office visit: 12/14/2019 Date of last refill: 11/09/2019 Last refill amount: 120 tab 1 refill Follow up time period per chart: 4 months

## 2020-01-23 DIAGNOSIS — Z951 Presence of aortocoronary bypass graft: Secondary | ICD-10-CM

## 2020-01-23 HISTORY — DX: Presence of aortocoronary bypass graft: Z95.1

## 2020-01-28 MED ORDER — ENTRESTO 24-26 MG PO TABS: 1.0000 | ORAL_TABLET | Freq: Two times a day (BID) | ORAL | 3 refills | Status: DC

## 2020-02-02 NOTE — Telephone Encounter (Signed)
Dr Blenda Mounts portion completed and faxed to [employer]

## 2020-02-17 DIAGNOSIS — I639 Cerebral infarction, unspecified: Secondary | ICD-10-CM | POA: Diagnosis not present

## 2020-02-17 DIAGNOSIS — Z7901 Long term (current) use of anticoagulants: Secondary | ICD-10-CM | POA: Diagnosis not present

## 2020-02-26 ENCOUNTER — Encounter: Payer: Self-pay | Admitting: *Deleted

## 2020-02-26 NOTE — Telephone Encounter (Signed)
Received letter patient assistance for [name] has been approved

## 2020-03-02 ENCOUNTER — Other Ambulatory Visit: Payer: Self-pay

## 2020-03-02 ENCOUNTER — Encounter: Payer: Self-pay | Admitting: Family Medicine

## 2020-03-02 ENCOUNTER — Ambulatory Visit (INDEPENDENT_AMBULATORY_CARE_PROVIDER_SITE_OTHER): Payer: Medicare HMO | Admitting: Family Medicine

## 2020-03-02 VITALS — BP 137/66 | HR 98 | Temp 97.6°F | Ht 67.0 in

## 2020-03-02 DIAGNOSIS — K089 Disorder of teeth and supporting structures, unspecified: Secondary | ICD-10-CM

## 2020-03-02 DIAGNOSIS — D699 Hemorrhagic condition, unspecified: Secondary | ICD-10-CM

## 2020-03-02 DIAGNOSIS — I69959 Hemiplegia and hemiparesis following unspecified cerebrovascular disease affecting unspecified side: Secondary | ICD-10-CM

## 2020-03-02 DIAGNOSIS — I5042 Chronic combined systolic (congestive) and diastolic (congestive) heart failure: Secondary | ICD-10-CM | POA: Diagnosis not present

## 2020-03-02 DIAGNOSIS — G8929 Other chronic pain: Secondary | ICD-10-CM | POA: Diagnosis not present

## 2020-03-02 DIAGNOSIS — G40909 Epilepsy, unspecified, not intractable, without status epilepticus: Secondary | ICD-10-CM

## 2020-03-02 LAB — CBC WITH DIFFERENTIAL/PLATELET
Basophils Absolute: 0.1 10*3/uL (ref 0.0–0.2)
Basos: 1 %
EOS (ABSOLUTE): 0 10*3/uL (ref 0.0–0.4)
Eos: 1 %
Hematocrit: 38.5 % (ref 34.0–46.6)
Hemoglobin: 12.8 g/dL (ref 11.1–15.9)
Immature Grans (Abs): 0 10*3/uL (ref 0.0–0.1)
Immature Granulocytes: 0 %
Lymphocytes Absolute: 2.2 10*3/uL (ref 0.7–3.1)
Lymphs: 32 %
MCH: 28.2 pg (ref 26.6–33.0)
MCHC: 33.2 g/dL (ref 31.5–35.7)
MCV: 85 fL (ref 79–97)
Monocytes Absolute: 0.5 10*3/uL (ref 0.1–0.9)
Monocytes: 8 %
Neutrophils Absolute: 4 10*3/uL (ref 1.4–7.0)
Neutrophils: 58 %
Platelets: 303 10*3/uL (ref 150–450)
RBC: 4.54 x10E6/uL (ref 3.77–5.28)
RDW: 12.6 % (ref 11.7–15.4)
WBC: 6.8 10*3/uL (ref 3.4–10.8)

## 2020-03-02 MED ORDER — AZITHROMYCIN 500 MG PO TABS: ORAL_TABLET | ORAL | 0 refills | Status: AC

## 2020-03-02 NOTE — Progress Notes (Signed)
2/9/202212:29 PM  [name] [place] 10-01-1950, 70 y.o., female [id number]  Chief Complaint  Patient presents with  . tooth and mouth pain     Reports has some swelling in both cheeks as well as pain and difficulty eating worse  in last weeks, Having trouble with dentist waiting on coordinator to schedule oral surgery     HPI:   Patient is a 70 y.o. female with past medical history significant for CVA, seizures, HF, who presents today for dental pain.  Teeth pain She had an evaluation Was supposed to have extractions Delayed due to COVID Needs to have surgery: would like second referral sent Has been having dental pain since sept Needs all her teeth removed by the dentist Will need to stop coumadin for this The teeth pain is making it hard for her to eat This past week pain has gotten worse and stopped eating   Patient Care Team: Neshia Mckenzie, [name], FNP as PCP - General (Family Medicine) Skeet Latch, MD as PCP - Cardiology (Cardiology) Adrian Prows, MD as Attending Physician (Cardiology) Burnard Bunting, MD (Internal Medicine)  Essential hypertension  Monitors BP at home. BP Readings from Last 3 Encounters:  03/02/20 137/66  12/30/19 116/68  12/14/19 (!) 173/74   Acute combined systolic and diastolic heart failure [facility] Managed by Cardiology Digoxin level: last 11/23/19: .9  Vitamin D deficiency Takes OTC D3 2000 IU per day Last vitamin D Lab Results  Component Value Date   VD25OH 47.8 12/14/2019    Localized osteoporosis without current pathological fracture dexa improved 7/21 ostepenia,. Lowest score -2.20m   low FRAX score,  had been on fosamax for 5 years, discontinued 07/2019  Mixed hyperlipidemia  LDL goal < 70 Lab Results  Component Value Date   CHOL 120 10/08/2019   HDL 39 (L) 10/08/2019   LDLCALC 61 10/08/2019   TRIG 107 10/08/2019   CHOLHDL 3.1 10/08/2019    History of CVA (cerebrovascular accident) and Anticoagulated on Coumadin,  Seizures  With hemiparesis and aphasia as sequela. Managed by neuro every 6 months CVA in 2009, Will see neuro in December Dr. Sabra Heck manages every 6 weeks Seizures last year in the hospital No seizure since that time Walks in the house with a can Right hand contracture and right ankle brace Right leg paralyzed Aphasia    Health Maintenance  Topic Date Due  . COVID-19 Vaccine (3 - Booster for Pfizer series) 10/16/2019  . INFLUENZA VACCINE  04/21/2020 (Originally 08/23/2019)  . COLONOSCOPY (Pts 45-23yrs Insurance coverage will need to be confirmed)  10/07/2020 (Originally 10/21/2017)  . MAMMOGRAM  08/03/2021  . TETANUS/TDAP  12/13/2022  . DEXA SCAN  Completed  . Hepatitis C Screening  Completed  . PNA vac Low Risk Adult  Completed      Depression screen [facility] 2/9 03/02/2020 12/14/2019 04/09/2019  Decreased Interest 0 0 0  Down, Depressed, Hopeless 0 0 0  PHQ - 2 Score 0 0 0    Fall Risk  03/02/2020 12/14/2019 10/08/2019 04/09/2019 10/10/2018  Falls in the past year? 0 0 - 0 0  Number falls in past yr: 0 0 0 0 1  Injury with Fall? 0 0 0 0 1  Risk for fall due to : - - - Impaired balance/gait;Mental status change -  Follow up Falls evaluation completed Falls evaluation completed - - -     Allergies  Allergen Reactions  . Lexiscan [Regadenoson] Other (See Comments)    Perioral tingling and throat tightness  Prior to Admission medications   Medication Sig Start Date End Date Taking? Authorizing Provider  atorvastatin (LIPITOR) 80 MG tablet TAKE 1 TABLET AT BEDTIME 07/04/19  Yes Rutherford Guys, MD  baclofen (LIORESAL) 20 MG tablet TAKE 1 TABLET THREE TIMES DAILY (MAY TAKE AN ADDITIONAL TABLET MID-DAY OR BEFORE BED AS NEEDED FOR MUSCLE SPASMS) 11/09/19  Yes Maximiano Coss, NP  carvedilol (COREG) 6.25 MG tablet TAKE 1 TABLET (6.25 MG TOTAL) BY MOUTH 2 (TWO) TIMES DAILY. 10/05/19  Yes Rutherford Guys, MD  Cholecalciferol 50 MCG (2000 UT) TABS Take by mouth.   Yes [provider]  digoxin (LANOXIN) 0.125 MG tablet TAKE 1 TABLET (125 MCG TOTAL) BY MOUTH DAILY. 07/04/19  Yes Rutherford Guys, MD  furosemide (LASIX) 40 MG tablet Take 1 tablet (40 mg total) by mouth daily. 02/26/19  Yes Rutherford Guys, MD  Lacosamide 100 MG TABS Take 1 tablet (100 mg total) by mouth 2 (two) times daily. 09/30/19  Yes Penumalli, Earlean Polka, MD  levETIRAcetam (KEPPRA) 750 MG tablet Take 2 tablets (1,500 mg total) by mouth 2 (two) times daily. 12/24/18 03/18/20 Yes Penumalli, Earlean Polka, MD  polyethylene glycol (MIRALAX / GLYCOLAX) packet Take 17 g by mouth daily as needed for moderate constipation.   Yes [provider]  sacubitril-valsartan (ENTRESTO) 24-26 MG Take 1 tablet by mouth 2 (two) times daily. 10/16/19  Yes Skeet Latch, MD  spironolactone (ALDACTONE) 25 MG tablet TAKE 1/2 TABLET EVERY DAY 11/10/19  Yes Skeet Latch, MD  warfarin (COUMADIN) 5 MG tablet TAKE AS DIRECTED BY COUMADIN CLINIC. NEED OFFICE VISIT WITH FASTING LABS FOR REFILLS Patient taking differently: Take 5 mg by mouth one time only at 6 PM. Take 1 tablet (5mg ) po daily at 6PM. 09/03/18  Yes Rutherford Guys, MD    Past Medical History:  Diagnosis Date  . Adenomatous polyp of colon 09/2012   repeat colonoscopy in 5 years by Dr. Sharlett Iles  . CHF (congestive heart failure) (HCC)    EF 15-20% as of 05/28/11 (Dr Le[facility] summary)  . CVA (cerebral infarction) 12/2007  . Hemiparesis ([place])   . Hyperlipidemia   . Hypertension   . Seizures ([place])   . Sickle cell anemia (HCC)   . Stroke ([place])   . Vitamin D deficiency 2010    Past Surgical History:  Procedure Laterality Date  . ABDOMINAL HYSTERECTOMY    . FRACTURE SURGERY    . HIP SURGERY    . TUBAL LIGATION      Social History   Tobacco Use  . Smoking status: Former Smoker    Types: Cigarettes    Quit date: 09/26/2007    Years since quitting: 12.4  . Smokeless tobacco: Never Used  Substance Use Topics  . Alcohol use: No     Family History  Problem Relation Age of Onset  . Diabetes Daughter   . Colon cancer Maternal Grandfather     Review of Systems  Constitutional: Negative for chills, fever and malaise/fatigue.  Eyes: Negative for blurred vision and double vision.  Respiratory: Negative for cough, shortness of breath and wheezing.   Cardiovascular: Negative for chest pain, palpitations and leg swelling.  Gastrointestinal: Positive for constipation. Negative for abdominal pain, blood in stool, diarrhea, heartburn, nausea and vomiting.  Genitourinary: Negative for dysuria, frequency, hematuria and urgency.       Incontinence at night  Musculoskeletal: Negative for back pain and joint pain.  Skin: Negative for rash.  Neurological: Positive  for focal weakness and weakness. Negative for dizziness, seizures and headaches.  Endo/Heme/Allergies: Bruises/bleeds easily.   OBJECTIVE:  Today's Vitals   03/02/20 1041  BP: 137/66  Pulse: 98  Temp: 97.6 F (36.4 C)  SpO2: 97%  Height: 5\' 7"  (1.702 m)   Body mass index is 19.89 kg/m.   Physical Exam Constitutional:      General: She is not in acute distress.    Appearance: Normal appearance. She is not ill-appearing.  HENT:     Head: Normocephalic.     Mouth/Throat:     Mouth: Mucous membranes are moist. No oral lesions.     Dentition: Abnormal dentition. Dental tenderness present. No gum lesions.     Pharynx: Oropharynx is clear.  Cardiovascular:     Rate and Rhythm: Normal rate and regular rhythm.     Pulses: Normal pulses.     Heart sounds: Normal heart sounds. No murmur heard. No friction rub. No gallop.   Pulmonary:     Effort: Pulmonary effort is normal. No respiratory distress.     Breath sounds: Normal breath sounds. No stridor. No wheezing, rhonchi or rales.  Abdominal:     General: Bowel sounds are normal.     Palpations: Abdomen is soft.     Tenderness: There is no abdominal tenderness.  Musculoskeletal:     Right lower leg: No  edema.     Left lower leg: No edema.     Comments: RLE paralysis, and Right hand contracture with brace  Lymphadenopathy:     Head:     Right side of head: No submental, submandibular, tonsillar, preauricular, posterior auricular or occipital adenopathy.     Left side of head: No submental, submandibular, tonsillar, preauricular, posterior auricular or occipital adenopathy.     Cervical: No cervical adenopathy.     Right cervical: No superficial, deep or posterior cervical adenopathy.    Left cervical: No superficial, deep or posterior cervical adenopathy.  Skin:    General: Skin is warm and dry.  Neurological:     Mental Status: She is alert and oriented to person, place, and time. Mental status is at baseline.     Gait: Gait abnormal.     Comments: Aphasia  Psychiatric:        Mood and Affect: Mood normal.        Behavior: Behavior normal.     No results found for this or any previous visit (from the past 24 hour(s)).  No results found.   ASSESSMENT and PLAN  Problem List Items Addressed This Visit      Cardiovascular and Mediastinum   Chronic combined systolic and diastolic heart failure (HCC)     Nervous and Auditory   Seizure disorder (HCC)   Hemiplegia of dominant side as late effect following cerebrovascular disease ([place])     Other   Bleeding tendency ([place])    Other Visit Diagnoses    Chronic dental pain    -  Primary   Relevant Medications   azithromycin (ZITHROMAX) 500 MG tablet   Other Relevant Orders   Ambulatory referral to Dentistry   CBC with Differential     Plan Will follow up with lab results. Needs to follow up with dentistry Complete antibiotics as ordered  Return if symptoms worsen or fail to improve, for Next scheduled visit.    Huston Foley Zaron Zwiefelhofer, FNP-BC Primary [facility] at [facility] [place], [place] [address] Ph.  [phone] Fax [phone]

## 2020-03-02 NOTE — Patient Instructions (Addendum)
Dental Pain Dental pain is often a sign that something is wrong with your teeth or gums. You can also have pain after a dental treatment. If you have dental pain, it is important to contact your dentist, especially if the cause of the pain is not known. Dental pain may hurt a lot or a little and can be caused by many things, including:  Tooth decay (cavities or caries).  Infection.  The inner part of the tooth being filled with pus (an abscess).  Injury.  A crack in the tooth.  Gums that move back and expose the root of a tooth.  Gum disease.  Abnormal grinding or clenching of teeth.  Not taking good care of your teeth. Sometimes the cause of pain is not known. You may have pain all the time, or it may happen only when you are:  Chewing.  Exposed to hot or cold temperatures.  Eating or drinking foods or drinks that have a lot of sugar in them, such as soda or candy. Follow these instructions at home: Medicines  Take over-the-counter and prescription medicines only as told by your dentist.  If you were prescribed an antibiotic medicine, take it as told by your dentist. Do not stop taking it even if you start to feel better. Eating and drinking Do not eat foods or drinks that cause you pain. These include:  Very hot or very cold foods or drinks.  Sweet or sugary foods or drinks. Managing pain and swelling  If told, put ice on the painful area of your face. To do this: ? Put ice in a plastic bag. ? Place a towel between your skin and the bag. ? Leave the ice on for 20 minutes, 2-3 times a day. ? Take off the ice if your skin turns bright red. This is very important. If you cannot feel pain, heat, or cold, you have a greater risk of damage to the area.   Brushing your teeth  Brush your teeth twice a day using a fluoride toothpaste.  Use a toothpaste made for sensitive teeth as told by your dentist.  Use a soft toothbrush. General instructions  Floss your teeth  at least once a day.  Do not put heat on the outside of your face.  Rinse your mouth often with salt water. To make salt water, dissolve -1 tsp (3-6 g) of salt in 1 cup (237 mL) of warm water.  Watch your dental pain. Let your dentist know if there are any changes.  Keep all follow-up visits. Contact a dentist if:  You have dental pain and you do not know why.  Medicine does not help your pain.  Your symptoms get worse.  You have new symptoms. Get help right away if:  You cannot open your mouth.  You are having trouble breathing or swallowing.  You have a fever.  Your face, neck, or jaw is swollen. These symptoms may be an emergency. Get help right away. Call your local emergency services (911 in the [place]  Do not wait to see if the symptoms will go away.  Do not drive yourself to the hospital. Summary  Dental pain may be caused by many things, including tooth decay, injury, or infection. In some cases, the cause is not known.  Dental pain may hurt a lot or very little. You may have pain all the time, or you may have it only when you eat or drink.  Take over-the-counter and prescription medicines only as  told by your dentist.  Watch your dental pain for any changes. Let your dentist know if symptoms get worse. This information is not intended to replace advice given to you by your health care provider. Make sure you discuss any questions you have with your health care provider. Document Revised: 10/14/2019 Document Reviewed: 10/14/2019 Elsevier Patient Education  2021 [employer].   If you have lab work done today you will be contacted with your lab results within the next 2 weeks.  If you have not heard from [place] then please contact us. The fastest way to get your results is to register for My Chart.   IF you received an x-ray today, you will receive an invoice from [facility]. Please contact [facility] at [phone] with questions or concerns  regarding your invoice.   IF you received labwork today, you will receive an invoice from [facility]. Please contact [facility] at [phone] with questions or concerns regarding your invoice.   Our billing staff will not be able to assist you with questions regarding bills from these companies.  You will be contacted with the lab results as soon as they are available. The fastest way to get your results is to activate your My Chart account. Instructions are located on the last page of this paperwork. If you have not heard from [place] regarding the results in 2 weeks, please contact this office.

## 2020-03-07 ENCOUNTER — Other Ambulatory Visit: Payer: Self-pay | Admitting: Registered Nurse

## 2020-03-07 ENCOUNTER — Other Ambulatory Visit: Payer: Self-pay | Admitting: Family Medicine

## 2020-03-07 DIAGNOSIS — M62838 Other muscle spasm: Secondary | ICD-10-CM

## 2020-03-07 MED ORDER — BACLOFEN 20 MG PO TABS: ORAL_TABLET | ORAL | 1 refills | Status: DC

## 2020-03-07 NOTE — Telephone Encounter (Signed)
Requested medication (s) are due for refill today:   Due 03/15/20  [facility])  Requested medication (s) are on the active medication list: yes  Last refill: 01/13/20  #120  1 refill  Future visit scheduled  Yes 04/12/20  Notes to clinic: not delegated  Requested Prescriptions  Pending Prescriptions Disp Refills   baclofen (LIORESAL) 20 MG tablet [Pharmacy Med Name: BACLOFEN 20 MG Tablet] 120 tablet 1    Sig: TAKE 1 TABLET THREE TIMES DAILY (MAY TAKE AN ADDITIONAL TABLET MID-DAY OR BEFORE BED AS NEEDED FOR MUSCLE SPASMS)      Not Delegated - Analgesics:  Muscle Relaxants Failed - 03/07/2020 10:43 AM      Failed - This refill cannot be delegated      Passed - Valid encounter within last 6 months    Recent Outpatient Visits           5 days ago Chronic dental pain   [facility] at [facility], [name], FNP   2 months ago Essential hypertension   Primary [facility] at [facility], [name], FNP   5 months ago Essential hypertension   [facility] at [facility], Lilia Argue, MD   7 months ago Poor dentition   Primary Care at [facility], Lilia Argue, MD   11 months ago Localized osteoporosis without current pathological fracture   Primary Care at [facility], Lilia Argue, MD       Future Appointments             In 1 month Just, [name], FNP Primary [facility] at [place], [facility]   In 10 months Debbora Presto, NP [facility]

## 2020-03-22 ENCOUNTER — Other Ambulatory Visit: Payer: Self-pay | Admitting: Family Medicine

## 2020-04-07 DIAGNOSIS — Z7901 Long term (current) use of anticoagulants: Secondary | ICD-10-CM | POA: Diagnosis not present

## 2020-04-07 DIAGNOSIS — I639 Cerebral infarction, unspecified: Secondary | ICD-10-CM | POA: Diagnosis not present

## 2020-04-12 ENCOUNTER — Encounter: Payer: Self-pay | Admitting: Family Medicine

## 2020-04-12 ENCOUNTER — Other Ambulatory Visit: Payer: Self-pay

## 2020-04-12 ENCOUNTER — Ambulatory Visit (INDEPENDENT_AMBULATORY_CARE_PROVIDER_SITE_OTHER): Payer: Medicare HMO | Admitting: Family Medicine

## 2020-04-12 VITALS — BP 134/70 | HR 69 | Temp 97.5°F | Ht 67.0 in

## 2020-04-12 DIAGNOSIS — E782 Mixed hyperlipidemia: Secondary | ICD-10-CM

## 2020-04-12 DIAGNOSIS — I10 Essential (primary) hypertension: Secondary | ICD-10-CM | POA: Diagnosis not present

## 2020-04-12 NOTE — Progress Notes (Signed)
3/22/20229:35 AM  [name] May 10, 1950, 70 y.o., female [id number]  Chief Complaint  Patient presents with  . Follow-up    Dental pain , pt is not currently in any dental pain and is waiting on dentist to review and schedule chart   . Skin Discoloration    On facial area     HPI:   Patient is a 70 y.o. female with past medical history significant for CVA, seizures, HF, who presents today for chronic care follow up.  Appointment scheduled with new PCP 4/20 No acute issues at this time Dental pain is improved, still has not went to the dentist The dentist she was seeing previously left Working on getting a new dentist Would like to wait on labs to see new provider No refills needed at this time   Patient Care Team: Abad Manard, [name], FNP as PCP - General (Family Medicine) Skeet Latch, MD as PCP - Cardiology (Cardiology) Adrian Prows, MD as Attending Physician (Cardiology) Burnard Bunting, MD (Internal Medicine)  Essential hypertension  Monitors BP at home. BP Readings from Last 3 Encounters:  04/12/20 134/70  03/02/20 137/66  12/30/19 116/68   Acute combined systolic and diastolic heart failure: Managed by Cardiology Digoxin .125mg  Entresto Spironolactone Carvedilol   Vitamin D deficiency Takes OTC D3 2000 IU per day Last vitamin D Lab Results  Component Value Date   VD25OH 47.8 12/14/2019    Localized osteoporosis without current pathological fracture dexa improved 7/21 ostepenia,. Lowest score -2.102m   low FRAX score,  had been on fosamax for 5 years, discontinued 07/2019  Mixed hyperlipidemia  LDL goal < 70 Atorvastatin 80 mg Lab Results  Component Value Date   CHOL 120 10/08/2019   HDL 39 (L) 10/08/2019   LDLCALC 61 10/08/2019   TRIG 107 10/08/2019   CHOLHDL 3.1 10/08/2019    History of CVA (cerebrovascular accident) and Anticoagulated on Coumadin, Seizures  With hemiparesis and aphasia as sequela. Managed by neuro every 6  months CVA in 2009, Dr. Sabra Heck manages every 6 weeks No seizure activity in the past year Warfarin Keppra Lacosamide Walks in the house with a cane Right hand contracture with brace and right ankle brace Right leg paralyzed     Health Maintenance  Topic Date Due  . COVID-19 Vaccine (3 - Booster for Pfizer series) 10/16/2019  . INFLUENZA VACCINE  04/21/2020 (Originally 08/23/2019)  . COLONOSCOPY (Pts 45-70yrs Insurance coverage will need to be confirmed)  10/07/2020 (Originally 10/21/2017)  . MAMMOGRAM  08/03/2021  . TETANUS/TDAP  12/13/2022  . DEXA SCAN  Completed  . Hepatitis C Screening  Completed  . PNA vac Low Risk Adult  Completed  . HPV VACCINES  Aged Out      Depression screen [facility] 2/9 04/12/2020 03/02/2020 12/14/2019  Decreased Interest 0 0 0  Down, Depressed, Hopeless 0 0 0  PHQ - 2 Score 0 0 0    Fall Risk  04/12/2020 03/02/2020 12/14/2019 10/08/2019 04/09/2019  Falls in the past year? 0 0 0 - 0  Number falls in past yr: 0 0 0 0 0  Injury with Fall? 0 0 0 0 0  Risk for fall due to : - - - - Impaired balance/gait;Mental status change  Follow up Falls evaluation completed Falls evaluation completed Falls evaluation completed - -     Allergies  Allergen Reactions  . Lexiscan [Regadenoson] Other (See Comments)    Perioral tingling and throat tightness    Prior to Admission medications  Medication Sig Start Date End Date Taking? Authorizing Provider  atorvastatin (LIPITOR) 80 MG tablet TAKE 1 TABLET AT BEDTIME 07/04/19  Yes Rutherford Guys, MD  baclofen (LIORESAL) 20 MG tablet TAKE 1 TABLET THREE TIMES DAILY (MAY TAKE AN ADDITIONAL TABLET MID-DAY OR BEFORE BED AS NEEDED FOR MUSCLE SPASMS) 11/09/19  Yes Maximiano Coss, NP  carvedilol (COREG) 6.25 MG tablet TAKE 1 TABLET (6.25 MG TOTAL) BY MOUTH 2 (TWO) TIMES DAILY. 10/05/19  Yes Rutherford Guys, MD  Cholecalciferol 50 MCG (2000 UT) TABS Take by mouth.   Yes [provider]  digoxin (LANOXIN) 0.125 MG tablet  TAKE 1 TABLET (125 MCG TOTAL) BY MOUTH DAILY. 07/04/19  Yes Rutherford Guys, MD  furosemide (LASIX) 40 MG tablet Take 1 tablet (40 mg total) by mouth daily. 02/26/19  Yes Rutherford Guys, MD  Lacosamide 100 MG TABS Take 1 tablet (100 mg total) by mouth 2 (two) times daily. 09/30/19  Yes Penumalli, Earlean Polka, MD  levETIRAcetam (KEPPRA) 750 MG tablet Take 2 tablets (1,500 mg total) by mouth 2 (two) times daily. 12/24/18 03/18/20 Yes Penumalli, Earlean Polka, MD  polyethylene glycol (MIRALAX / GLYCOLAX) packet Take 17 g by mouth daily as needed for moderate constipation.   Yes [provider]  sacubitril-valsartan (ENTRESTO) 24-26 MG Take 1 tablet by mouth 2 (two) times daily. 10/16/19  Yes Skeet Latch, MD  spironolactone (ALDACTONE) 25 MG tablet TAKE 1/2 TABLET EVERY DAY 11/10/19  Yes Skeet Latch, MD  warfarin (COUMADIN) 5 MG tablet TAKE AS DIRECTED BY COUMADIN CLINIC. NEED OFFICE VISIT WITH FASTING LABS FOR REFILLS Patient taking differently: Take 5 mg by mouth one time only at 6 PM. Take 1 tablet (5mg ) po daily at 6PM. 09/03/18  Yes Rutherford Guys, MD    Past Medical History:  Diagnosis Date  . Adenomatous polyp of colon 09/2012   repeat colonoscopy in 5 years by Dr. Sharlett Iles  . CHF (congestive heart failure) (HCC)    EF 15-20% as of 05/28/11 (Dr [facility]grand [facility] summary)  . CVA (cerebral infarction) 12/2007  . Hemiparesis ([place])   . Hyperlipidemia   . Hypertension   . Seizures ([place])   . Sickle cell anemia (HCC)   . Stroke ([place])   . Vitamin D deficiency 2010    Past Surgical History:  Procedure Laterality Date  . ABDOMINAL HYSTERECTOMY    . FRACTURE SURGERY    . HIP SURGERY    . TUBAL LIGATION      Social History   Tobacco Use  . Smoking status: Former Smoker    Types: Cigarettes    Quit date: 09/26/2007    Years since quitting: 12.5  . Smokeless tobacco: Never Used  Substance Use Topics  . Alcohol use: No    Family History  Problem Relation Age of  Onset  . Diabetes Daughter   . Colon cancer Maternal Grandfather     Review of Systems  Constitutional: Negative for chills, fever and malaise/fatigue.  Eyes: Negative for blurred vision and double vision.  Respiratory: Negative for cough, shortness of breath and wheezing.   Cardiovascular: Negative for chest pain, palpitations and leg swelling.  Gastrointestinal: Positive for constipation. Negative for abdominal pain, blood in stool, diarrhea, heartburn, nausea and vomiting.  Genitourinary: Negative for dysuria, frequency, hematuria and urgency.       Incontinence at night  Musculoskeletal: Negative for back pain and joint pain.  Skin: Negative for rash.  Neurological: Positive for focal weakness and weakness. Negative  for dizziness, seizures and headaches.  Endo/Heme/Allergies: Bruises/bleeds easily.   OBJECTIVE:  Today's Vitals   04/12/20 0908  BP: 134/70  Pulse: 69  Temp: (!) 97.5 F (36.4 C)  SpO2: 99%  Height: 5\' 7"  (1.702 m)   Body mass index is 19.89 kg/m.   Physical Exam Constitutional:      General: She is not in acute distress.    Appearance: Normal appearance. She is not ill-appearing.  HENT:     Head: Normocephalic.     Mouth/Throat:     Mouth: Mucous membranes are moist. No oral lesions.     Dentition: Abnormal dentition. No dental tenderness or gum lesions.     Pharynx: Oropharynx is clear.  Cardiovascular:     Rate and Rhythm: Normal rate and regular rhythm.     Pulses: Normal pulses.     Heart sounds: Normal heart sounds. No murmur heard. No friction rub. No gallop.   Pulmonary:     Effort: Pulmonary effort is normal. No respiratory distress.     Breath sounds: Normal breath sounds. No stridor. No wheezing, rhonchi or rales.  Abdominal:     General: Bowel sounds are normal.     Palpations: Abdomen is soft.     Tenderness: There is no abdominal tenderness.  Musculoskeletal:     Right lower leg: No edema.     Left lower leg: No edema.      Comments: RLE paralysis, and Right hand contracture with brace  Lymphadenopathy:     Head:     Right side of head: No submental, submandibular, tonsillar, preauricular, posterior auricular or occipital adenopathy.     Left side of head: No submental, submandibular, tonsillar, preauricular, posterior auricular or occipital adenopathy.     Cervical: No cervical adenopathy.     Right cervical: No superficial, deep or posterior cervical adenopathy.    Left cervical: No superficial, deep or posterior cervical adenopathy.  Skin:    General: Skin is warm and dry.  Neurological:     Mental Status: She is alert and oriented to person, place, and time. Mental status is at baseline.     Gait: Gait abnormal.     Comments: Aphasia  Psychiatric:        Mood and Affect: Mood normal.        Behavior: Behavior normal.     No results found for this or any previous visit (from the past 24 hour(s)).  No results found.   ASSESSMENT and PLAN  Problem List Items Addressed This Visit      Cardiovascular and Mediastinum   HTN (hypertension) - Primary     Other   HLD (hyperlipidemia)     Plan  Will do labs next visit  Needs to follow up with dentistry  Chronic conditions stable on current regimen   Return for New PCP in April.   Huston Foley Kyeisha Janowicz, FNP-BC Primary Care at [place], [place] [address] Ph.  [phone] Fax [phone]

## 2020-04-12 NOTE — H&P (View-Only) (Signed)
3/22/20229:35 AM  [name] [birth year], 70 y.o., female [id number]  Chief Complaint  Patient presents with  . Follow-up    Dental pain , pt is not currently in any dental pain and is waiting on dentist to review and schedule chart   . Skin Discoloration    On facial area     HPI:   Patient is a 70 y.o. female with past medical history significant for CVA, seizures, HF, who presents today for chronic care follow up.  Appointment scheduled with new PCP 4/20 No acute issues at this time Dental pain is improved, still has not went to the dentist The dentist she was seeing previously left Working on getting a new dentist Would like to wait on labs to see new provider No refills needed at this time   Patient Care Team: Delaine Canter, [name], FNP as PCP - General (Family Medicine) Skeet Latch, MD as PCP - Cardiology (Cardiology) Adrian Prows, MD as Attending Physician (Cardiology) Burnard Bunting, MD (Internal Medicine)  Essential hypertension  Monitors BP at home. BP Readings from Last 3 Encounters:  04/12/20 134/70  03/02/20 137/66  12/30/19 116/68   Acute combined systolic and diastolic heart failure: Managed by Cardiology Digoxin .125mg  Entresto Spironolactone Carvedilol   Vitamin D deficiency Takes OTC D3 2000 IU per day Last vitamin D Lab Results  Component Value Date   VD25OH 47.8 12/14/2019    Localized osteoporosis without current pathological fracture dexa improved 7/21 ostepenia,. Lowest score -2.44m   low FRAX score,  had been on fosamax for 5 years, discontinued 07/2019  Mixed hyperlipidemia  LDL goal < 70 Atorvastatin 80 mg Lab Results  Component Value Date   CHOL 120 10/08/2019   HDL 39 (L) 10/08/2019   LDLCALC 61 10/08/2019   TRIG 107 10/08/2019   CHOLHDL 3.1 10/08/2019    History of CVA (cerebrovascular accident) and Anticoagulated on Coumadin, Seizures  With hemiparesis and aphasia as sequela. Managed by neuro every 6  months CVA in 2009, Dr. Sabra Heck manages every 6 weeks No seizure activity in the past year Warfarin Keppra Lacosamide Walks in the house with a cane Right hand contracture with brace and right ankle brace Right leg paralyzed     Health Maintenance  Topic Date Due  . COVID-19 Vaccine (3 - Booster for Pfizer series) 10/16/2019  . INFLUENZA VACCINE  04/21/2020 (Originally 08/23/2019)  . COLONOSCOPY (Pts 45-64yrs Insurance coverage will need to be confirmed)  10/07/2020 (Originally 10/21/2017)  . MAMMOGRAM  08/03/2021  . TETANUS/TDAP  12/13/2022  . DEXA SCAN  Completed  . Hepatitis C Screening  Completed  . PNA vac Low Risk Adult  Completed  . HPV VACCINES  Aged Out      Depression screen [facility] 2/9 04/12/2020 03/02/2020 12/14/2019  Decreased Interest 0 0 0  Down, Depressed, Hopeless 0 0 0  PHQ - 2 Score 0 0 0    Fall Risk  04/12/2020 03/02/2020 12/14/2019 10/08/2019 04/09/2019  Falls in the past year? 0 0 0 - 0  Number falls in past yr: 0 0 0 0 0  Injury with Fall? 0 0 0 0 0  Risk for fall due to : - - - - Impaired balance/gait;Mental status change  Follow up Falls evaluation completed Falls evaluation completed Falls evaluation completed - -     Allergies  Allergen Reactions  . Lexiscan [Regadenoson] Other (See Comments)    Perioral tingling and throat tightness    Prior to Admission medications  Medication Sig Start Date End Date Taking? Authorizing Provider  atorvastatin (LIPITOR) 80 MG tablet TAKE 1 TABLET AT BEDTIME 07/04/19  Yes Rutherford Guys, MD  baclofen (LIORESAL) 20 MG tablet TAKE 1 TABLET THREE TIMES DAILY (MAY TAKE AN ADDITIONAL TABLET MID-DAY OR BEFORE BED AS NEEDED FOR MUSCLE SPASMS) 11/09/19  Yes Maximiano Coss, NP  carvedilol (COREG) 6.25 MG tablet TAKE 1 TABLET (6.25 MG TOTAL) BY MOUTH 2 (TWO) TIMES DAILY. 10/05/19  Yes Rutherford Guys, MD  Cholecalciferol 50 MCG (2000 UT) TABS Take by mouth.   Yes [provider]  digoxin (LANOXIN) 0.125 MG tablet  TAKE 1 TABLET (125 MCG TOTAL) BY MOUTH DAILY. 07/04/19  Yes Rutherford Guys, MD  furosemide (LASIX) 40 MG tablet Take 1 tablet (40 mg total) by mouth daily. 02/26/19  Yes Rutherford Guys, MD  Lacosamide 100 MG TABS Take 1 tablet (100 mg total) by mouth 2 (two) times daily. 09/30/19  Yes Penumalli, Earlean Polka, MD  levETIRAcetam (KEPPRA) 750 MG tablet Take 2 tablets (1,500 mg total) by mouth 2 (two) times daily. 12/24/18 03/18/20 Yes Penumalli, Earlean Polka, MD  polyethylene glycol (MIRALAX / GLYCOLAX) packet Take 17 g by mouth daily as needed for moderate constipation.   Yes [provider]  sacubitril-valsartan (ENTRESTO) 24-26 MG Take 1 tablet by mouth 2 (two) times daily. 10/16/19  Yes Skeet Latch, MD  spironolactone (ALDACTONE) 25 MG tablet TAKE 1/2 TABLET EVERY DAY 11/10/19  Yes Skeet Latch, MD  warfarin (COUMADIN) 5 MG tablet TAKE AS DIRECTED BY COUMADIN CLINIC. NEED OFFICE VISIT WITH FASTING LABS FOR REFILLS Patient taking differently: Take 5 mg by mouth one time only at 6 PM. Take 1 tablet (5mg ) po daily at 6PM. 09/03/18  Yes Rutherford Guys, MD    Past Medical History:  Diagnosis Date  . Adenomatous polyp of colon 09/2012   repeat colonoscopy in 5 years by Dr. Sharlett Iles  . CHF (congestive heart failure) (HCC)    EF 15-20% as of 05/28/11 (Dr Legrand [facility] D[facility] summary)  . CVA (cerebral infarction) 12/2007  . Hemiparesis ([place])   . Hyperlipidemia   . Hypertension   . Seizures ([place])   . Sickle cell anemia (HCC)   . Stroke ([place])   . Vitamin D deficiency 2010    Past Surgical History:  Procedure Laterality Date  . ABDOMINAL HYSTERECTOMY    . FRACTURE SURGERY    . HIP SURGERY    . TUBAL LIGATION      Social History   Tobacco Use  . Smoking status: Former Smoker    Types: Cigarettes    Quit date: 09/26/2007    Years since quitting: 12.5  . Smokeless tobacco: Never Used  Substance Use Topics  . Alcohol use: No    Family History  Problem Relation Age of  Onset  . Diabetes Daughter   . Colon cancer Maternal Grandfather     Review of Systems  Constitutional: Negative for chills, fever and malaise/fatigue.  Eyes: Negative for blurred vision and double vision.  Respiratory: Negative for cough, shortness of breath and wheezing.   Cardiovascular: Negative for chest pain, palpitations and leg swelling.  Gastrointestinal: Positive for constipation. Negative for abdominal pain, blood in stool, diarrhea, heartburn, nausea and vomiting.  Genitourinary: Negative for dysuria, frequency, hematuria and urgency.       Incontinence at night  Musculoskeletal: Negative for back pain and joint pain.  Skin: Negative for rash.  Neurological: Positive for focal weakness and weakness. Negative  for dizziness, seizures and headaches.  Endo/Heme/Allergies: Bruises/bleeds easily.   OBJECTIVE:  Today's Vitals   04/12/20 0908  BP: 134/70  Pulse: 69  Temp: (!) 97.5 F (36.4 C)  SpO2: 99%  Height: 5\' 7"  (1.702 m)   Body mass index is 19.89 kg/m.   Physical Exam Constitutional:      General: She is not in acute distress.    Appearance: Normal appearance. She is not ill-appearing.  HENT:     Head: Normocephalic.     Mouth/Throat:     Mouth: Mucous membranes are moist. No oral lesions.     Dentition: Abnormal dentition. No dental tenderness or gum lesions.     Pharynx: Oropharynx is clear.  Cardiovascular:     Rate and Rhythm: Normal rate and regular rhythm.     Pulses: Normal pulses.     Heart sounds: Normal heart sounds. No murmur heard. No friction rub. No gallop.   Pulmonary:     Effort: Pulmonary effort is normal. No respiratory distress.     Breath sounds: Normal breath sounds. No stridor. No wheezing, rhonchi or rales.  Abdominal:     General: Bowel sounds are normal.     Palpations: Abdomen is soft.     Tenderness: There is no abdominal tenderness.  Musculoskeletal:     Right lower leg: No edema.     Left lower leg: No edema.      Comments: RLE paralysis, and Right hand contracture with brace  Lymphadenopathy:     Head:     Right side of head: No submental, submandibular, tonsillar, preauricular, posterior auricular or occipital adenopathy.     Left side of head: No submental, submandibular, tonsillar, preauricular, posterior auricular or occipital adenopathy.     Cervical: No cervical adenopathy.     Right cervical: No superficial, deep or posterior cervical adenopathy.    Left cervical: No superficial, deep or posterior cervical adenopathy.  Skin:    General: Skin is warm and dry.  Neurological:     Mental Status: She is alert and oriented to person, place, and time. Mental status is at baseline.     Gait: Gait abnormal.     Comments: Aphasia  Psychiatric:        Mood and Affect: Mood normal.        Behavior: Behavior normal.     No results found for this or any previous visit (from the past 24 hour(s)).  No results found.   ASSESSMENT and PLAN  Problem List Items Addressed This Visit      Cardiovascular and Mediastinum   HTN (hypertension) - Primary     Other   HLD (hyperlipidemia)     Plan  Will do labs next visit  Needs to follow up with dentistry  Chronic conditions stable on current regimen   Return for New PCP in April.   Huston Foley Veleria Barnhardt, FNP-BC Primary Care at [place], [place] [address] Ph.  [phone] Fax [phone]

## 2020-04-12 NOTE — Patient Instructions (Addendum)
  Health Maintenance After Age 72 After age 72, you are at a higher risk for certain long-term diseases and infections as well as injuries from falls. Falls are a major cause of broken bones and head injuries in people who are older than age 72. Getting regular preventive care can help to keep you healthy and well. Preventive care includes getting regular testing and making lifestyle changes as recommended by your health care provider. Talk with your health care provider about:  Which screenings and tests you should have. A screening is a test that checks for a disease when you have no symptoms.  A diet and exercise plan that is right for you. What should I know about screenings and tests to prevent falls? Screening and testing are the best ways to find a health problem early. Early diagnosis and treatment give you the best chance of managing medical conditions that are common after age 72. Certain conditions and lifestyle choices may make you more likely to have a fall. Your health care provider may recommend:  Regular vision checks. Poor vision and conditions such as cataracts can make you more likely to have a fall. If you wear glasses, make sure to get your prescription updated if your vision changes.  Medicine review. Work with your health care provider to regularly review all of the medicines you are taking, including over-the-counter medicines. Ask your health care provider about any side effects that may make you more likely to have a fall. Tell your health care provider if any medicines that you take make you feel dizzy or sleepy.  Osteoporosis screening. Osteoporosis is a condition that causes the bones to get weaker. This can make the bones weak and cause them to break more easily.  Blood pressure screening. Blood pressure changes and medicines to control blood pressure can make you feel dizzy.  Strength and balance checks. Your health care provider may recommend certain tests to  check your strength and balance while standing, walking, or changing positions.  Foot health exam. Foot pain and numbness, as well as not wearing proper footwear, can make you more likely to have a fall.  Depression screening. You may be more likely to have a fall if you have a fear of falling, feel emotionally low, or feel unable to do activities that you used to do.  Alcohol use screening. Using too much alcohol can affect your balance and may make you more likely to have a fall. What actions can I take to lower my risk of falls? General instructions  Talk with your health care provider about your risks for falling. Tell your health care provider if: ? You fall. Be sure to tell your health care provider about all falls, even ones that seem minor. ? You feel dizzy, sleepy, or off-balance.  Take over-the-counter and prescription medicines only as told by your health care provider. These include any supplements.  Eat a healthy diet and maintain a healthy weight. A healthy diet includes low-fat dairy products, low-fat (lean) meats, and fiber from whole grains, beans, and lots of fruits and vegetables. Home safety  Remove any tripping hazards, such as rugs, cords, and clutter.  Install safety equipment such as grab bars in bathrooms and safety rails on stairs.  Keep rooms and walkways well-lit. Activity  Follow a regular exercise program to stay fit. This will help you maintain your balance. Ask your health care provider what types of exercise are appropriate for you.  If you need a cane   or walker, use it as recommended by your health care provider.  Wear supportive shoes that have nonskid soles.   Lifestyle  Do not drink alcohol if your health care provider tells you not to drink.  If you drink alcohol, limit how much you have: ? 0-1 drink a day for women. ? 0-2 drinks a day for men.  Be aware of how much alcohol is in your drink. In the [place] one drink equals one typical bottle  of beer (12 oz), one-half glass of wine (5 oz), or one shot of hard liquor (1 oz).  Do not use any products that contain nicotine or tobacco, such as cigarettes and e-cigarettes. If you need help quitting, ask your health care provider. Summary  Having a healthy lifestyle and getting preventive care can help to protect your health and wellness after age 72.  Screening and testing are the best way to find a health problem early and help you avoid having a fall. Early diagnosis and treatment give you the best chance for managing medical conditions that are more common for people who are older than age 72.  Falls are a major cause of broken bones and head injuries in people who are older than age 72. Take precautions to prevent a fall at home.  Work with your health care provider to learn what changes you can make to improve your health and wellness and to prevent falls. This information is not intended to replace advice given to you by your health care provider. Make sure you discuss any questions you have with your health care provider. Document Revised: 05/01/2018 Document Reviewed: 11/21/2016 Elsevier Patient Education  2021 Elsevier Inc.   If you have lab work done today you will be contacted with your lab results within the next 2 weeks.  If you have not heard from us then please contact us. The fastest way to get your results is to register for My Chart.   IF you received an x-ray today, you will receive an invoice from [facility]. Please contact [facility] at [phone] with questions or concerns regarding your invoice.   IF you received labwork today, you will receive an invoice from [facility]. Please contact [facility] at [phone] with questions or concerns regarding your invoice.   Our billing staff will not be able to assist you with questions regarding bills from these companies.  You will be contacted with the lab results as soon as they are available.  The fastest way to get your results is to activate your My Chart account. Instructions are located on the last page of this paperwork. If you have not heard from us regarding the results in 2 weeks, please contact this office.      

## 2020-04-18 ENCOUNTER — Other Ambulatory Visit: Payer: Self-pay | Admitting: Family Medicine

## 2020-04-18 DIAGNOSIS — I5042 Chronic combined systolic (congestive) and diastolic (congestive) heart failure: Secondary | ICD-10-CM

## 2020-04-18 DIAGNOSIS — Z8673 Personal history of transient ischemic attack (TIA), and cerebral infarction without residual deficits: Secondary | ICD-10-CM

## 2020-04-18 DIAGNOSIS — I429 Cardiomyopathy, unspecified: Secondary | ICD-10-CM

## 2020-04-21 ENCOUNTER — Encounter (HOSPITAL_COMMUNITY)
Admission: EM | Disposition: A | Discharge status: Skilled Nursing Facility | Payer: Self-pay | Source: Home / Self Care | Attending: Thoracic Surgery (Cardiothoracic Vascular Surgery)

## 2020-04-21 ENCOUNTER — Inpatient Hospital Stay (HOSPITAL_COMMUNITY)
Admission: EM | Admit: 2020-04-21 | Discharge: 2020-05-05 | DRG: 234 | Disposition: A | Discharge status: Skilled Nursing Facility | Payer: Medicare HMO | Attending: Thoracic Surgery (Cardiothoracic Vascular Surgery) | Admitting: Thoracic Surgery (Cardiothoracic Vascular Surgery)

## 2020-04-21 ENCOUNTER — Emergency Department (HOSPITAL_COMMUNITY): Payer: Medicare HMO

## 2020-04-21 ENCOUNTER — Encounter (HOSPITAL_COMMUNITY): Payer: Self-pay | Admitting: Cardiology

## 2020-04-21 ENCOUNTER — Other Ambulatory Visit: Payer: Self-pay

## 2020-04-21 ENCOUNTER — Inpatient Hospital Stay (HOSPITAL_COMMUNITY): Payer: Medicare HMO

## 2020-04-21 DIAGNOSIS — R54 Age-related physical debility: Secondary | ICD-10-CM | POA: Diagnosis present

## 2020-04-21 DIAGNOSIS — R1311 Dysphagia, oral phase: Secondary | ICD-10-CM | POA: Diagnosis not present

## 2020-04-21 DIAGNOSIS — D62 Acute posthemorrhagic anemia: Secondary | ICD-10-CM | POA: Diagnosis not present

## 2020-04-21 DIAGNOSIS — Z833 Family history of diabetes mellitus: Secondary | ICD-10-CM

## 2020-04-21 DIAGNOSIS — Z90710 Acquired absence of both cervix and uterus: Secondary | ICD-10-CM

## 2020-04-21 DIAGNOSIS — Z86010 Personal history of colonic polyps: Secondary | ICD-10-CM

## 2020-04-21 DIAGNOSIS — Z23 Encounter for immunization: Secondary | ICD-10-CM | POA: Diagnosis not present

## 2020-04-21 DIAGNOSIS — J9811 Atelectasis: Secondary | ICD-10-CM | POA: Diagnosis not present

## 2020-04-21 DIAGNOSIS — I2510 Atherosclerotic heart disease of native coronary artery without angina pectoris: Secondary | ICD-10-CM | POA: Diagnosis not present

## 2020-04-21 DIAGNOSIS — M2550 Pain in unspecified joint: Secondary | ICD-10-CM | POA: Diagnosis not present

## 2020-04-21 DIAGNOSIS — J811 Chronic pulmonary edema: Secondary | ICD-10-CM | POA: Diagnosis not present

## 2020-04-21 DIAGNOSIS — I351 Nonrheumatic aortic (valve) insufficiency: Secondary | ICD-10-CM | POA: Diagnosis present

## 2020-04-21 DIAGNOSIS — R4020 Unspecified coma: Secondary | ICD-10-CM | POA: Diagnosis not present

## 2020-04-21 DIAGNOSIS — G459 Transient cerebral ischemic attack, unspecified: Secondary | ICD-10-CM | POA: Diagnosis not present

## 2020-04-21 DIAGNOSIS — D571 Sickle-cell disease without crisis: Secondary | ICD-10-CM | POA: Diagnosis present

## 2020-04-21 DIAGNOSIS — J90 Pleural effusion, not elsewhere classified: Secondary | ICD-10-CM | POA: Diagnosis not present

## 2020-04-21 DIAGNOSIS — I252 Old myocardial infarction: Secondary | ICD-10-CM | POA: Diagnosis not present

## 2020-04-21 DIAGNOSIS — Z20822 Contact with and (suspected) exposure to covid-19: Secondary | ICD-10-CM | POA: Diagnosis present

## 2020-04-21 DIAGNOSIS — R2681 Unsteadiness on feet: Secondary | ICD-10-CM | POA: Diagnosis not present

## 2020-04-21 DIAGNOSIS — I69351 Hemiplegia and hemiparesis following cerebral infarction affecting right dominant side: Secondary | ICD-10-CM | POA: Diagnosis not present

## 2020-04-21 DIAGNOSIS — I69391 Dysphagia following cerebral infarction: Secondary | ICD-10-CM | POA: Diagnosis not present

## 2020-04-21 DIAGNOSIS — D72829 Elevated white blood cell count, unspecified: Secondary | ICD-10-CM | POA: Diagnosis not present

## 2020-04-21 DIAGNOSIS — Z800 Family history of malignant neoplasm of digestive organs: Secondary | ICD-10-CM | POA: Diagnosis not present

## 2020-04-21 DIAGNOSIS — I255 Ischemic cardiomyopathy: Secondary | ICD-10-CM | POA: Diagnosis present

## 2020-04-21 DIAGNOSIS — R262 Difficulty in walking, not elsewhere classified: Secondary | ICD-10-CM | POA: Diagnosis not present

## 2020-04-21 DIAGNOSIS — D696 Thrombocytopenia, unspecified: Secondary | ICD-10-CM | POA: Diagnosis not present

## 2020-04-21 DIAGNOSIS — Z951 Presence of aortocoronary bypass graft: Secondary | ICD-10-CM

## 2020-04-21 DIAGNOSIS — J189 Pneumonia, unspecified organism: Secondary | ICD-10-CM | POA: Diagnosis not present

## 2020-04-21 DIAGNOSIS — Z888 Allergy status to other drugs, medicaments and biological substances status: Secondary | ICD-10-CM | POA: Diagnosis not present

## 2020-04-21 DIAGNOSIS — I10 Essential (primary) hypertension: Secondary | ICD-10-CM | POA: Diagnosis not present

## 2020-04-21 DIAGNOSIS — E559 Vitamin D deficiency, unspecified: Secondary | ICD-10-CM | POA: Diagnosis present

## 2020-04-21 DIAGNOSIS — R0902 Hypoxemia: Secondary | ICD-10-CM | POA: Diagnosis not present

## 2020-04-21 DIAGNOSIS — G40909 Epilepsy, unspecified, not intractable, without status epilepticus: Secondary | ICD-10-CM | POA: Diagnosis present

## 2020-04-21 DIAGNOSIS — M25571 Pain in right ankle and joints of right foot: Secondary | ICD-10-CM | POA: Diagnosis not present

## 2020-04-21 DIAGNOSIS — M816 Localized osteoporosis [Lequesne]: Secondary | ICD-10-CM | POA: Diagnosis present

## 2020-04-21 DIAGNOSIS — I959 Hypotension, unspecified: Secondary | ICD-10-CM | POA: Diagnosis not present

## 2020-04-21 DIAGNOSIS — Z79899 Other long term (current) drug therapy: Secondary | ICD-10-CM | POA: Diagnosis not present

## 2020-04-21 DIAGNOSIS — R55 Syncope and collapse: Secondary | ICD-10-CM | POA: Diagnosis not present

## 2020-04-21 DIAGNOSIS — I5022 Chronic systolic (congestive) heart failure: Secondary | ICD-10-CM | POA: Diagnosis not present

## 2020-04-21 DIAGNOSIS — R000 Tachycardia, unspecified: Secondary | ICD-10-CM | POA: Diagnosis not present

## 2020-04-21 DIAGNOSIS — I69320 Aphasia following cerebral infarction: Secondary | ICD-10-CM

## 2020-04-21 DIAGNOSIS — R531 Weakness: Secondary | ICD-10-CM | POA: Diagnosis not present

## 2020-04-21 DIAGNOSIS — Z95818 Presence of other cardiac implants and grafts: Secondary | ICD-10-CM

## 2020-04-21 DIAGNOSIS — Z7901 Long term (current) use of anticoagulants: Secondary | ICD-10-CM

## 2020-04-21 DIAGNOSIS — I088 Other rheumatic multiple valve diseases: Secondary | ICD-10-CM | POA: Diagnosis not present

## 2020-04-21 DIAGNOSIS — Z87891 Personal history of nicotine dependence: Secondary | ICD-10-CM | POA: Diagnosis not present

## 2020-04-21 DIAGNOSIS — I442 Atrioventricular block, complete: Principal | ICD-10-CM | POA: Diagnosis present

## 2020-04-21 DIAGNOSIS — R0602 Shortness of breath: Secondary | ICD-10-CM | POA: Diagnosis not present

## 2020-04-21 DIAGNOSIS — I517 Cardiomegaly: Secondary | ICD-10-CM | POA: Diagnosis not present

## 2020-04-21 DIAGNOSIS — I110 Hypertensive heart disease with heart failure: Secondary | ICD-10-CM | POA: Diagnosis present

## 2020-04-21 DIAGNOSIS — R001 Bradycardia, unspecified: Secondary | ICD-10-CM | POA: Diagnosis not present

## 2020-04-21 DIAGNOSIS — Z01810 Encounter for preprocedural cardiovascular examination: Secondary | ICD-10-CM | POA: Diagnosis not present

## 2020-04-21 DIAGNOSIS — M79604 Pain in right leg: Secondary | ICD-10-CM | POA: Diagnosis not present

## 2020-04-21 DIAGNOSIS — I447 Left bundle-branch block, unspecified: Secondary | ICD-10-CM | POA: Diagnosis not present

## 2020-04-21 DIAGNOSIS — Z7401 Bed confinement status: Secondary | ICD-10-CM | POA: Diagnosis not present

## 2020-04-21 DIAGNOSIS — I5042 Chronic combined systolic (congestive) and diastolic (congestive) heart failure: Secondary | ICD-10-CM | POA: Diagnosis present

## 2020-04-21 DIAGNOSIS — I5023 Acute on chronic systolic (congestive) heart failure: Secondary | ICD-10-CM | POA: Diagnosis not present

## 2020-04-21 DIAGNOSIS — E782 Mixed hyperlipidemia: Secondary | ICD-10-CM | POA: Diagnosis present

## 2020-04-21 DIAGNOSIS — M6281 Muscle weakness (generalized): Secondary | ICD-10-CM | POA: Diagnosis not present

## 2020-04-21 DIAGNOSIS — Z950 Presence of cardiac pacemaker: Secondary | ICD-10-CM | POA: Diagnosis not present

## 2020-04-21 DIAGNOSIS — I509 Heart failure, unspecified: Secondary | ICD-10-CM | POA: Diagnosis not present

## 2020-04-21 DIAGNOSIS — I452 Bifascicular block: Secondary | ICD-10-CM | POA: Diagnosis present

## 2020-04-21 DIAGNOSIS — R490 Dysphonia: Secondary | ICD-10-CM | POA: Diagnosis not present

## 2020-04-21 DIAGNOSIS — R4701 Aphasia: Secondary | ICD-10-CM | POA: Diagnosis not present

## 2020-04-21 DIAGNOSIS — I4430 Unspecified atrioventricular block: Secondary | ICD-10-CM | POA: Diagnosis not present

## 2020-04-21 DIAGNOSIS — R404 Transient alteration of awareness: Secondary | ICD-10-CM | POA: Diagnosis not present

## 2020-04-21 HISTORY — PX: TEMPORARY PACEMAKER: CATH118268

## 2020-04-21 LAB — CBC WITH DIFFERENTIAL/PLATELET
Abs Immature Granulocytes: 0.02 10*3/uL (ref 0.00–0.07)
Basophils Absolute: 0 10*3/uL (ref 0.0–0.1)
Basophils Relative: 0 %
Eosinophils Absolute: 0.1 10*3/uL (ref 0.0–0.5)
Eosinophils Relative: 1 %
HCT: 37 % (ref 36.0–46.0)
Hemoglobin: 11.1 g/dL — ABNORMAL LOW (ref 12.0–15.0)
Immature Granulocytes: 0 %
Lymphocytes Relative: 73 %
Lymphs Abs: 6.6 10*3/uL — ABNORMAL HIGH (ref 0.7–4.0)
MCH: 28.8 pg (ref 26.0–34.0)
MCHC: 30 g/dL (ref 30.0–36.0)
MCV: 95.9 fL (ref 80.0–100.0)
Monocytes Absolute: 0.7 10*3/uL (ref 0.1–1.0)
Monocytes Relative: 8 %
Neutro Abs: 1.7 10*3/uL (ref 1.7–7.7)
Neutrophils Relative %: 18 %
Platelets: 322 10*3/uL (ref 150–400)
RBC: 3.86 MIL/uL — ABNORMAL LOW (ref 3.87–5.11)
RDW: 14 % (ref 11.5–15.5)
WBC: 9.2 10*3/uL (ref 4.0–10.5)
nRBC: 0 % (ref 0.0–0.2)

## 2020-04-21 LAB — COMPREHENSIVE METABOLIC PANEL
ALT: 15 U/L (ref 0–44)
AST: 28 U/L (ref 15–41)
Albumin: 3.3 g/dL — ABNORMAL LOW (ref 3.5–5.0)
Alkaline Phosphatase: 95 U/L (ref 38–126)
Anion gap: 16 — ABNORMAL HIGH (ref 5–15)
BUN: 16 mg/dL (ref 8–23)
CO2: 16 mmol/L — ABNORMAL LOW (ref 22–32)
Calcium: 8.4 mg/dL — ABNORMAL LOW (ref 8.9–10.3)
Chloride: 106 mmol/L (ref 98–111)
Creatinine, Ser: 1.25 mg/dL — ABNORMAL HIGH (ref 0.44–1.00)
GFR, Estimated: 46 mL/min — ABNORMAL LOW (ref >60)
Glucose, Bld: 244 mg/dL — ABNORMAL HIGH (ref 70–99)
Potassium: 3.8 mmol/L (ref 3.5–5.1)
Sodium: 138 mmol/L (ref 135–145)
Total Bilirubin: 0.4 mg/dL (ref 0.3–1.2)
Total Protein: 6.5 g/dL (ref 6.5–8.1)

## 2020-04-21 LAB — ECHOCARDIOGRAM COMPLETE
Area-P 1/2: 3.77 cm2
Height: 67 in
P 1/2 time: 351 msec
S' Lateral: 2.5 cm
Weight: 2010.6 oz

## 2020-04-21 LAB — RESP PANEL BY RT-PCR (FLU A&B, COVID) ARPGX2
Influenza A by PCR: NEGATIVE
Influenza B by PCR: NEGATIVE
SARS Coronavirus 2 by RT PCR: NEGATIVE

## 2020-04-21 LAB — TROPONIN I (HIGH SENSITIVITY)
Troponin I (High Sensitivity): 70 ng/L — ABNORMAL HIGH (ref <18)
Troponin I (High Sensitivity): 124 ng/L (ref <18)

## 2020-04-21 LAB — I-STAT CHEM 8, ED
BUN: 16 mg/dL (ref 8–23)
Calcium, Ion: 1.02 mmol/L — ABNORMAL LOW (ref 1.15–1.40)
Chloride: 107 mmol/L (ref 98–111)
Creatinine, Ser: 1 mg/dL (ref 0.44–1.00)
Glucose, Bld: 228 mg/dL — ABNORMAL HIGH (ref 70–99)
HCT: 33 % — ABNORMAL LOW (ref 36.0–46.0)
Hemoglobin: 11.2 g/dL — ABNORMAL LOW (ref 12.0–15.0)
Potassium: 3.6 mmol/L (ref 3.5–5.1)
Sodium: 138 mmol/L (ref 135–145)
TCO2: 16 mmol/L — ABNORMAL LOW (ref 22–32)

## 2020-04-21 LAB — PROTIME-INR
INR: 2.2 — ABNORMAL HIGH (ref 0.8–1.2)
Prothrombin Time: 23.3 seconds — ABNORMAL HIGH (ref 11.4–15.2)

## 2020-04-21 LAB — MRSA PCR SCREENING: MRSA by PCR: NEGATIVE

## 2020-04-21 LAB — DIGOXIN LEVEL: Digoxin Level: 0.8 ng/mL (ref 0.8–2.0)

## 2020-04-21 SURGERY — TEMPORARY PACEMAKER
Anesthesia: LOCAL

## 2020-04-21 MED ORDER — FENTANYL CITRATE (PF) 100 MCG/2ML IJ SOLN
INTRAMUSCULAR | Status: DC | PRN
  Administered 2020-04-21: 25 ug via INTRAVENOUS

## 2020-04-21 MED ORDER — CHLORHEXIDINE GLUCONATE CLOTH 2 % EX PADS
6.0000 | MEDICATED_PAD | Freq: Every day | CUTANEOUS | Status: DC
  Administered 2020-04-21 – 2020-04-25 (×5): 6 via TOPICAL

## 2020-04-21 MED ORDER — LACOSAMIDE 50 MG PO TABS
100.0000 mg | ORAL_TABLET | Freq: Two times a day (BID) | ORAL | Status: DC
  Administered 2020-04-21 – 2020-04-25 (×9): 100 mg via ORAL
  Filled 2020-04-21 (×9): qty 2

## 2020-04-21 MED ORDER — SODIUM CHLORIDE 0.9 % IV SOLN: INTRAVENOUS | Status: DC

## 2020-04-21 MED ORDER — HEPARIN (PORCINE) IN NACL 1000-0.9 UT/500ML-% IV SOLN
INTRAVENOUS | Status: AC
  Filled 2020-04-21: qty 500

## 2020-04-21 MED ORDER — ACETAMINOPHEN 325 MG PO TABS: 650.0000 mg | ORAL_TABLET | ORAL | Status: DC | PRN

## 2020-04-21 MED ORDER — POLYETHYLENE GLYCOL 3350 17 G PO PACK: 17.0000 g | PACK | Freq: Every day | ORAL | Status: DC | PRN

## 2020-04-21 MED ORDER — SODIUM CHLORIDE 0.9% FLUSH: 3.0000 mL | INTRAVENOUS | Status: DC | PRN

## 2020-04-21 MED ORDER — LIDOCAINE HCL (PF) 1 % IJ SOLN
INTRAMUSCULAR | Status: DC | PRN
  Administered 2020-04-21: 12 mL

## 2020-04-21 MED ORDER — ASPIRIN 81 MG PO CHEW: 81.0000 mg | CHEWABLE_TABLET | ORAL | Status: DC

## 2020-04-21 MED ORDER — SPIRONOLACTONE 12.5 MG HALF TABLET
12.5000 mg | ORAL_TABLET | Freq: Once | ORAL | Status: AC
  Administered 2020-04-21: 12.5 mg via ORAL
  Filled 2020-04-21: qty 1

## 2020-04-21 MED ORDER — SPIRONOLACTONE 12.5 MG HALF TABLET
12.5000 mg | ORAL_TABLET | Freq: Every day | ORAL | Status: DC
  Administered 2020-04-22 – 2020-04-25 (×4): 12.5 mg via ORAL
  Filled 2020-04-21 (×4): qty 1

## 2020-04-21 MED ORDER — ASPIRIN 81 MG PO CHEW
81.0000 mg | CHEWABLE_TABLET | ORAL | Status: AC
  Administered 2020-04-22: 81 mg via ORAL
  Filled 2020-04-21: qty 1

## 2020-04-21 MED ORDER — ONDANSETRON HCL 4 MG/2ML IJ SOLN: 4.0000 mg | Freq: Four times a day (QID) | INTRAMUSCULAR | Status: DC | PRN

## 2020-04-21 MED ORDER — BACLOFEN 20 MG PO TABS
20.0000 mg | ORAL_TABLET | Freq: Two times a day (BID) | ORAL | Status: DC
  Administered 2020-04-22 – 2020-04-25 (×7): 20 mg via ORAL
  Filled 2020-04-21 (×8): qty 1

## 2020-04-21 MED ORDER — ATORVASTATIN CALCIUM 80 MG PO TABS
80.0000 mg | ORAL_TABLET | Freq: Every day | ORAL | Status: DC
  Administered 2020-04-21 – 2020-04-24 (×4): 80 mg via ORAL
  Filled 2020-04-21 (×4): qty 1

## 2020-04-21 MED ORDER — SODIUM CHLORIDE 0.9 % IV SOLN: 250.0000 mL | INTRAVENOUS | Status: DC | PRN

## 2020-04-21 MED ORDER — SODIUM CHLORIDE 0.9% FLUSH: 3.0000 mL | Freq: Two times a day (BID) | INTRAVENOUS | Status: DC

## 2020-04-21 MED ORDER — LIDOCAINE HCL (PF) 1 % IJ SOLN
INTRAMUSCULAR | Status: AC
  Filled 2020-04-21: qty 30

## 2020-04-21 MED ORDER — NITROGLYCERIN 0.4 MG SL SUBL: 0.4000 mg | SUBLINGUAL_TABLET | SUBLINGUAL | Status: DC | PRN

## 2020-04-21 MED ORDER — FUROSEMIDE 40 MG PO TABS
40.0000 mg | ORAL_TABLET | Freq: Every day | ORAL | Status: DC
  Administered 2020-04-21 – 2020-04-25 (×5): 40 mg via ORAL
  Filled 2020-04-21 (×5): qty 1

## 2020-04-21 MED ORDER — FENTANYL CITRATE (PF) 100 MCG/2ML IJ SOLN
INTRAMUSCULAR | Status: AC
  Filled 2020-04-21: qty 2

## 2020-04-21 MED ORDER — SODIUM CHLORIDE 0.9% FLUSH
3.0000 mL | Freq: Two times a day (BID) | INTRAVENOUS | Status: DC
  Administered 2020-04-21 – 2020-04-22 (×2): 3 mL via INTRAVENOUS

## 2020-04-21 MED ORDER — LEVETIRACETAM 750 MG PO TABS
1500.0000 mg | ORAL_TABLET | Freq: Two times a day (BID) | ORAL | Status: DC
  Administered 2020-04-21 – 2020-04-25 (×9): 1500 mg via ORAL
  Filled 2020-04-21: qty 2
  Filled 2020-04-21: qty 6
  Filled 2020-04-21 (×2): qty 2
  Filled 2020-04-21 (×2): qty 6
  Filled 2020-04-21: qty 2
  Filled 2020-04-21 (×4): qty 6
  Filled 2020-04-21: qty 2

## 2020-04-21 MED ORDER — HEPARIN (PORCINE) IN NACL 1000-0.9 UT/500ML-% IV SOLN
INTRAVENOUS | Status: DC | PRN
  Administered 2020-04-21: 500 mL

## 2020-04-21 SURGICAL SUPPLY — 5 items
CATH S G BIP PACING (CATHETERS) ×1 IMPLANT
PACK CARDIAC CATHETERIZATION (CUSTOM PROCEDURE TRAY) ×2 IMPLANT
SHEATH PINNACLE 6F 10CM (SHEATH) ×1 IMPLANT
SHEATH PROBE COVER 6X72 (BAG) ×1 IMPLANT
SLEEVE REPOSITIONING LENGTH 30 (MISCELLANEOUS) ×1 IMPLANT

## 2020-04-21 NOTE — ED Triage Notes (Signed)
Pt from home via EMS, being paced on arrival to ED after having syncopal episode at home. Found by EMS to be in wide complex bradycardia at a rate of 27. Pt nonverbal s/p stroke but is normally able to follow commands etc.

## 2020-04-21 NOTE — H&P (Addendum)
Cardiology Admission History and Physical:   Patient ID: [name] MRN: [id number]; DOB: 09-06-1950   Admission date: 04/21/2020  PCP:  Just, [name], [place]  Cardiologist:  Skeet Latch, MD   Chief Complaint:  Complete heart block  Patient Profile:   [name] is a 70 y.o. 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  She presented to [facility] ED following an episode suspicious for seizure but then found to be in complete heart block.  History of Present Illness:   [name] was hospitalized in August 2020 with a seizure after being out of her medications for 4 days.  She required intubation section.  High-sensitivity troponin trended to 1905. An echocardiogram 08/2018 revealed EF of 35 to 40% with global hypokinesis and moderate LVH, elevated LVEDP.  She also had moderate AR. EKG with RBBB and LAFB, anterior, lateral and inferior TWI. Nuclear stress test in August 2020 showed a large severe fixed inferior, anterior apical, and inferolateral perfusion defect suggestive of scar without ischemia.  Study was high risk due to EF estimated at 34% but mentions no significant reversible ischemia. She was deemed not a good candidate for cardiac catheterization during that hospitalization. She has followed with Dr. Oval [name] and was last seen in November 2021. Echo in 2013 with an EF 50%.   Pt woke up this morning to get ready, but sat down and made "funny noises" and fell back on the bed. Husband witnessed the episode and thought she was having a seizure and called EMS. On arrival, CV strip with bradycardia with CHB. Pacer pads applied and she arrived in the [facility] dependent on transcutaneous pacing. Cardiology consulted for temp pacing wire.   At the time of this exam, mental  status is fluctuating, but she is able to open her eyes and nod yes or no to questions. She is tolerating external pacing for now. Pacer paused and she only had p waves. She denies current pain.   INR 2.2 iStat with Na 138, K 3.6, creatinine 1.0, Hb 11.2.   Per the husband, she is independent in many ADLs despite right sided weakness and expressive aphasia following and embolic stroke (e.g., was able to dress herself this morning).  She is maintained on 6.25 mg coreg BID, 0.125 mg digoxin, and coumadin. She is also on 80 mg lipitor, 40 mg lasix daily, 24-26 mg entresto BID, and 12.5 mg spironolactone.     Past Medical History:  Diagnosis Date  . Adenomatous polyp of colon 09/2012   repeat colonoscopy in 5 years by Dr. Sharlett Iles  . CHF (congestive heart failure) (HCC)    EF 15-20% as of 05/28/11 (Dr Legrand [facility]o [facility] D/C summary)  . CVA (cerebral infarction) 12/2007  . Hemiparesis ([place])   . Hyperlipidemia   . Hypertension   . Seizures ([place])   . Sickle cell anemia (HCC)   . Stroke ([place])   . Vitamin D deficiency 2010    Past Surgical History:  Procedure Laterality Date  . ABDOMINAL HYSTERECTOMY    . FRACTURE SURGERY    . HIP SURGERY    . TUBAL LIGATION       Medications Prior to Admission: Prior to Admission medications   Medication Sig Start Date End Date Taking? Authorizing Provider  atorvastatin (LIPITOR) 80 MG tablet TAKE 1 TABLET AT BEDTIME 07/04/19  Jacelyn Pi, Lilia Argue, MD  baclofen (LIORESAL) 20 MG tablet TAKE 1 TABLET THREE TIMES DAILY (MAY TAKE AN ADDITIONAL TABLET MID-DAY OR BEFORE BED AS NEEDED FOR MUSCLE SPASMS) 03/07/20   Just, [name], FNP  carvedilol (COREG) 6.25 MG tablet Take 1 tablet (6.25 mg total) by mouth 2 (two) times daily. 01/11/20   Just, [name], FNP  Cholecalciferol 50 MCG (2000 UT) TABS Take by mouth.    [provider]  digoxin (LANOXIN) 0.125 MG tablet TAKE 1 TABLET(125 MCG) BY MOUTH DAILY 03/22/20   Just, [name], FNP  furosemide  (LASIX) 40 MG tablet Take 1 tablet (40 mg total) by mouth daily. 02/26/19   Daleen Squibb, MD  Lacosamide 100 MG TABS Take 1 tablet (100 mg total) by mouth 2 (two) times daily. 12/30/19   Penumalli, Earlean Polka, MD  levETIRAcetam (KEPPRA) 750 MG tablet Take 2 tablets (1,500 mg total) by mouth 2 (two) times daily. 12/30/19 03/24/21  Penumalli, Earlean Polka, MD  polyethylene glycol (MIRALAX / GLYCOLAX) packet Take 17 g by mouth daily as needed for moderate constipation.    [provider]  sacubitril-valsartan (ENTRESTO) 24-26 MG Take 1 tablet by mouth 2 (two) times daily. 01/28/20   Skeet Latch, MD  spironolactone (ALDACTONE) 25 MG tablet TAKE 1/2 TABLET EVERY DAY 11/10/19   Skeet Latch, MD  warfarin (COUMADIN) 5 MG tablet TAKE AS DIRECTED BY COUMADIN CLINIC. NEED OFFICE VISIT WITH FASTING LABS FOR REFILLS Patient taking differently: Take 5 mg by mouth one time only at 6 PM. Take 1 tablet (5mg ) po daily at [facility]. 09/03/18   Jacelyn Pi, Lilia Argue, MD     Allergies:    Allergies  Allergen Reactions  . Lexiscan [Regadenoson] Other (See Comments)    Perioral tingling and throat tightness    Social History:   Social History   Socioeconomic History  . Marital status: Married    Spouse name: [name]  . Number of children: 2  . Years of education: 46  . Highest education level: Not on file  Occupational History    Employer: RETIRED  Tobacco Use  . Smoking status: Former Smoker    Types: Cigarettes    Quit date: 09/26/2007    Years since quitting: 12.5  . Smokeless tobacco: Never Used  Substance and Sexual Activity  . Alcohol use: No  . Drug use: No  . Sexual activity: Not Currently  Other Topics Concern  . Not on file  Social History Narrative   Lives with husband   Social Determinants of Health   Financial Resource Strain: Not on file  Food Insecurity: Not on file  Transportation Needs: Not on file  Physical Activity: Not on file  Stress: Not on file  Social  Connections: Not on file  Intimate Partner Violence: Not on file    Family History:   The patient's family history includes Colon cancer in her maternal grandfather; Diabetes in her daughter.    ROS:  Please see the history of present illness.  All other ROS reviewed and negative.     Physical Exam/Data:   Vitals:   04/21/20 0905 04/21/20 0915 04/21/20 0930 04/21/20 0935  BP: (!) 169/154 (!) 72/46 (!) 85/63 (!) 87/69  Pulse: (!) 45 70 (!) 33 (!) 33  Resp: (!) 21 (!) 43 (!) 50 (!) 35  Temp:      TempSrc:      SpO2: 100% (!) 68% 100% 100%   No intake or output data  in the 24 hours ending 04/21/20 0942 Last 3 Weights 04/12/2020 03/02/2020 12/30/2019  Weight (lbs) (No Data) (No Data) 127 lb  Weight (kg) (No Data) (No Data) 57.607 kg     There is no height or weight on file to calculate BMI.  General:  No acute distress Neck: no JVD Cardiac:  pacing Lungs:  clear to auscultation bilaterally, no wheezing, rhonchi or rales  Abd: soft, nontender, no hepatomegaly  Ext: no edema, right foot wrapped, right wrist braced Musculoskeletal:  No deformities,Right upper and lower extremity edema Skin: warm and dry  Neuro:  CNs 2-12 intact, no focal abnormalities noted Psych:  Normal affect    EKG:  The ECG that was done was personally reviewed and demonstrates complete heart block, no   Relevant CV Studies:  Echo 09/19/18: 1. The left ventricle has moderately reduced systolic function, with an  ejection fraction of 35-40%. The cavity size was normal. There is  moderately increased left ventricular wall thickness. Left ventricular  diastolic Doppler parameters are consistent  with impaired relaxation. Elevated left atrial and left ventricular  end-diastolic pressures The E/e' is >20.  2. Moderate hypokinesis of the left ventricular, entire inferior wall and  lateral wall.  3. The right ventricle has normal systolic function. The cavity was  normal. There is no increase in right  ventricular wall thickness.  4. Left atrial size was mildly dilated.  5. The mitral valve is grossly normal.  6. The tricuspid valve is grossly normal.  7. The aortic valve is tricuspid. Moderate sclerosis of the aortic valve.  Aortic valve regurgitation is moderate by color flow Doppler. No stenosis  of the aortic valve.  8. The aorta is normal unless otherwise noted.    Nuclear stress test   Laboratory Data:  High Sensitivity Troponin:  No results for input(s): TROPONINIHS in the last 720 hours.    Chemistry Recent Labs  Lab 04/21/20 0832  NA 138  K 3.6  CL 107  GLUCOSE 228*  BUN 16  CREATININE 1.00    No results for input(s): PROT, ALBUMIN, AST, ALT, ALKPHOS, BILITOT in the last 168 hours. Hematology Recent Labs  Lab 04/21/20 0825 04/21/20 0832  WBC 9.2  --   RBC 3.86*  --   HGB 11.1* 11.2*  HCT 37.0 33.0*  MCV 95.9  --   MCH 28.8  --   MCHC 30.0  --   RDW 14.0  --   PLT 322  --    BNPNo results for input(s): BNP, PROBNP in the last 168 hours.  DDimer No results for input(s): DDIMER in the last 168 hours.   Radiology/Studies:  DG Chest Portable 1 View  Result Date: 04/21/2020 CLINICAL DATA:  70 year old female with weakness, syncope, wide complex bradycardia. EXAM: PORTABLE CHEST 1 VIEW COMPARISON:  Portable chest 09/18/2018 and earlier. FINDINGS: Portable AP semi upright view at 0900 hours. Pacer/resuscitation pads project over the left chest. Larger lung volumes. Mediastinal contours are stable and within normal limits. Visualized tracheal air column is within normal limits. Allowing for portable technique the lungs are clear. No pneumothorax identified, linear external artifact projecting over the outer right lung. No acute osseous abnormality identified. IMPRESSION: No acute cardiopulmonary abnormality. Electronically Signed   By: Genevie Ann M.D.   On: 04/21/2020 09:08     Assessment and Plan:   Complete heart block - she is currently externally  pacing - pacer stopped and we observed p waves with no conduction - pressure is hypertensive -  INR 2.2, digoxin level 0.8 - plan for temp pacing wire placed this morning - will continue ongoing discussions with her husband regarding goals of care and possible need for PPM - will hold coreg for now   Chronic systolic and diastolic heart failure - EF 35-40% - previously deemed not a candidate for heart catheterization - nuclear stress test with fixed defect, but no mention of reversible ischemia - may consider right and left heart cath later this admission - maintained on coreg, entresto, spiro, and lasix,and digoxin   Hypertension - running high while pacing - following temp wire placement, we will titrate heart failure medications   Hyperlipidemia - continue 80 mg lipitor 10/08/2019: Cholesterol, Total 120; HDL 39; LDL Chol Calc (NIH) 61; Triglycerides 107   Sickle cell anemia Hx of embolic CVA Seizure disorder - maintained on 1500 mg BID - coumadin on hold for temp wire - will defer to interventional team for timing to start heparin    Risk Assessment/Risk Scores:   Severity of Illness: The appropriate patient status for this patient is INPATIENT. Inpatient status is judged to be reasonable and necessary in order to provide the required intensity of service to ensure the patient's safety. The patient's presenting symptoms, physical exam findings, and initial radiographic and laboratory data in the context of their chronic comorbidities is felt to place them at high risk for further clinical deterioration. Furthermore, it is not anticipated that the patient will be medically stable for discharge from the hospital within 2 midnights of admission. The following factors support the patient status of inpatient.   " The patient's presenting symptoms include collapse. " The worrisome physical exam findings include CHB. " The initial radiographic and laboratory data are worrisome  because of CHB. " The chronic co-morbidities include CHF.   * I certify that at the point of admission it is my clinical judgment that the patient will require inpatient hospital care spanning beyond 2 midnights from the point of admission due to high intensity of service, high risk for further deterioration and high frequency of surveillance required.*    For questions or updates, please contact [place] Please consult [email or web address] for contact info under     Signed, Ledora Bottcher, [place]  04/21/2020 9:42 AM   Patient seen with PA, agree with the above note.   See history above. Patient had syncopal episode and presented to the ER in CHB with slow escape rhythm.  She was externally paced initially.  I placed a temporary transvenous pacemaker this morning.  She is now RV pacing at 37.   General: NAD Neck: JVP 7-8 cm, no thyromegaly or thyroid nodule.  Lungs: Mild crackles at bases. CV: Nondisplaced PMI.  Heart regular S1/S2, no S3/S4, no murmur.  No peripheral edema.  No carotid bruit.  Normal pedal pulses.  Abdomen: Soft, nontender, no hepatosplenomegaly, no distention.  Skin: Intact without lesions or rashes.  Neurologic: Awake, aphasic, right hemiparesis.   Psych: Normal affect. Extremities: No clubbing or cyanosis.  HEENT: Normal.   Assessment/Plan: 1. Complete heart block: Patient had baseline NSR with LAFB/RBBB, so there was underlying conduction system disease that may have worsened spontaneously.  With aphasia, unable to tell me about pre-existing chest pain or dyspnea.  Initial HS-TnI 70.  She is on Coreg and digoxin at home, digoxin level 0.8.  - Stop nodal blockers.  - TTVP placed.  - Will follow off nodal blockers, suspect she will end up needing PPM.  With low EF, would  favor CRT device.  EP to see.  2. Chronic systolic CHF: Most recent echo in 8/20 with EF 35-40% and wall motion abnormalities.  Cardiolite in 8/20 with evidence for old inferior MI.  Suspect  ischemic cardiomyopathy but never had cath to confirm.  Current event may have been due to worsening of underlying conduction disease, but think we need echo to reassess LV function and also need to cycle troponin for evidence for ACS (she looks comfortable but unable to tell me about symptoms due to aphasia).  Not significantly volume overloaded on exam. Per her husband, at baseline she is aphasic but can walk around the house and do her ADLs.  - Can continue home Lasix and spironolactone.  - Hold digoxin and Coreg with CHB.  - Hold Entresto with soft BP.  - Needs repeat echo.  - INR 2.2 today, when INR < 2 would recommend coronary angiography given cardiomyopathy of uncertain etiology (likely ischemic) and new CHB (will set up for tomorrow).  - If she needs PPM, would recommend CRT.  3. 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  Has been on warfarin.  Has had long-standing aphasia and right hemiparesis.  Can walk around house and do ADLs.  - Warfarin on hold, cover with heparin gtt when INR < 2.  4. H/o seizure disorder: Continue Keppra.  5. ?Sickle cell anemia: Carries this diagnosis.   Loralie Champagne 04/21/2020 12:59 PM

## 2020-04-21 NOTE — Progress Notes (Addendum)
ANTICOAGULATION CONSULT NOTE - Initial Consult  Pharmacy Consult for Warfarin > Heparin Indication: stroke  Allergies  Allergen Reactions  . Lexiscan [Regadenoson] Other (See Comments)    Perioral tingling and throat tightness    Patient Measurements: Height: 5\' 7"  (170.2 cm) Weight: 57 kg (125 lb 10.6 oz) IBW/kg (Calculated) : 61.6 Heparin Dosing Weight: 57 kg  Vital Signs: Temp: 99 F (37.2 C) (03/31 0827) Temp Source: Rectal (03/31 0827) BP: 108/54 (03/31 1200) Pulse Rate: 80 (03/31 1200)  Labs: Recent Labs    04/21/20 0825 04/21/20 0832  HGB 11.1* 11.2*  HCT 37.0 33.0*  PLT 322  --   LABPROT 23.3*  --   INR 2.2*  --   CREATININE 1.25* 1.00  TROPONINIHS 70*  --     Estimated Creatinine Clearance: 46.4 mL/min (by C-G formula based on SCr of 1 mg/dL).   Medical History: Past Medical History:  Diagnosis Date  . Adenomatous polyp of colon 09/2012   repeat colonoscopy in 5 years by Dr. Sharlett Iles  . CHF (congestive heart failure) (HCC)    EF 15-20% as of 05/28/11 (Dr Legrand [facility]o [facility] D/C summary)  . CVA (cerebral infarction) 12/2007  . Hemiparesis ([place])   . Hyperlipidemia   . Hypertension   . Seizures ([place])   . Sickle cell anemia (HCC)   . Stroke ([place])   . Vitamin D deficiency 2010    Assessment: 70 yo female presents with CHB now s/p temporary pacer. On warfarin PTA for hx embolic stroke, no known hx of afib. INR on admit therapeutic at 2.2. Pharmacy asked to stop warfarin and to start heparin when INR <2.   PTA warfarin regimen: 5 mg daily except 7.5 mg Mon/Thurs INR on admit: 2.2  Goal of Therapy: Heparin level 0.3-0.7  INR 2-3 Monitor platelets by anticoagulation protocol: Yes   Plan:  Hold warfarin and f/u INR in AM for heparin start Monitor daily PT/INR, CBC, s/sx bleeding  Richardine Service, PharmD, BCPS PGY2 Cardiology Pharmacy Resident Phone: [phone] 04/21/2020  12:17 PM  Please check [email or web address] for unit-specific pharmacy phone  numbers.   Richardine Service 04/21/2020,12:09 PM

## 2020-04-21 NOTE — Plan of Care (Signed)
Called husband to discuss care:  Notes that patient had gotten dressed this morning and they were on their way to go pick up her Coreg prescription when she sat back on the couch, made a noise, and fell back in the bed.  No trauma.  No CP recently, no cough.  Patient was at her baseline- per husband.  Updated husband on clinical scenario.  Discussed Risks and benefits of both temporary and permanent pacing options, These include bleeding, infection, kidney damage, stroke, cardiac tamponade, death.  The patient's understands these risks and is willing for [place] to proceed if clinically indicated.  Pending Dig level and INR.  May need temp wire.  Full note pending.  Rudean Haskell, MD [place], [address] [place], [place] [id number] [phone]  9:15 AM

## 2020-04-21 NOTE — ED Provider Notes (Signed)
[facility] EMERGENCY DEPARTMENT Provider Note   CSN: [id number] Arrival date & time: 04/21/20  0820     History No chief complaint on file.   [name] is a 70 y.o. female. Level 5 caveat due to nonverbal status and urgent need for intervention. HPI Patient presents with syncope/third-degree heart block.  EMS reportedly called out for syncope/thought to be seizure.  Does have seizure history.  However upon arrival EMS found patient in third-degree heart block with a wide-complex beat.  Paced at EMS at 70 mA with good capture.  Also on epi drip at 4.  CBG was reassuring.  Patient has had previous stroke and has weak on the right side.  Nonverbal at baseline but can nod yes or no.    Past Medical History:  Diagnosis Date  . Adenomatous polyp of colon 09/2012   repeat colonoscopy in 5 years by Dr. Sharlett Iles  . CHF (congestive heart failure) (HCC)    EF 15-20% as of 05/28/11 (Dr [facility] summary)  . CVA (cerebral infarction) 12/2007  . Hemiparesis ([place])   . Hyperlipidemia   . Hypertension   . Seizures ([place])   . Sickle cell anemia (HCC)   . Stroke ([place])   . Vitamin D deficiency 2010    Patient Active Problem List   Diagnosis Date Noted  . Bleeding tendency ([place]) 12/14/2019  . Encounter for monitoring digoxin therapy 04/09/2019  . Status epilepticus ([place])   . Chronic combined systolic and diastolic heart failure ([place])   . Demand ischemia ([place])   . Acquired right foot drop 01/12/2017  . Osteoporosis 12/03/2014  . Vitamin D deficiency 12/19/2012  . Dysarthria as late effect of cerebrovascular accident (CVA) 12/12/2012  . Gait instability 12/12/2012  . Contracture of joint 12/12/2012  . Hemiplegia of dominant side as late effect following cerebrovascular disease ([place]) 12/12/2012  . Anticoagulated on Coumadin 09/19/2011  . Secondary cardiomyopathy ([place]) 08/24/2011  . (L) MCA cardio embolic stroke  81/19/1478  . H/O tobacco use  06/25/2011  . Weakness 05/28/2011  . History of CVA (cerebrovascular accident) 05/28/2011  . HTN (hypertension) 05/28/2011  . HLD (hyperlipidemia) 05/28/2011  . Seizure disorder ([place]) 05/28/2011    Past Surgical History:  Procedure Laterality Date  . ABDOMINAL HYSTERECTOMY    . FRACTURE SURGERY    . HIP SURGERY    . TUBAL LIGATION       OB History   No obstetric history on file.     Family History  Problem Relation Age of Onset  . Diabetes Daughter   . Colon cancer Maternal Grandfather     Social History   Tobacco Use  . Smoking status: Former Smoker    Types: Cigarettes    Quit date: 09/26/2007    Years since quitting: 12.5  . Smokeless tobacco: Never Used  Substance Use Topics  . Alcohol use: No  . Drug use: No    Home Medications Prior to Admission medications   Medication Sig Start Date End Date Taking? Authorizing Provider  atorvastatin (LIPITOR) 80 MG tablet TAKE 1 TABLET AT BEDTIME 07/04/19   Jacelyn Pi, Lilia Argue, MD  baclofen (LIORESAL) 20 MG tablet TAKE 1 TABLET THREE TIMES DAILY (MAY TAKE AN ADDITIONAL TABLET MID-DAY OR BEFORE BED AS NEEDED FOR MUSCLE SPASMS) 03/07/20   Just, [name], FNP  carvedilol (COREG) 6.25 MG tablet Take 1 tablet (6.25 mg total) by mouth 2 (two) times daily. 01/11/20   Just, [name], FNP  Cholecalciferol 50 MCG (2000 UT) TABS Take by mouth.    [provider]  digoxin (LANOXIN) 0.125 MG tablet TAKE 1 TABLET(125 MCG) BY MOUTH DAILY 03/22/20   Just, [name], FNP  furosemide (LASIX) 40 MG tablet Take 1 tablet (40 mg total) by mouth daily. 02/26/19   Daleen Squibb, MD  Lacosamide 100 MG TABS Take 1 tablet (100 mg total) by mouth 2 (two) times daily. 12/30/19   Penumalli, Earlean Polka, MD  levETIRAcetam (KEPPRA) 750 MG tablet Take 2 tablets (1,500 mg total) by mouth 2 (two) times daily. 12/30/19 03/24/21  Penumalli, Earlean Polka, MD  polyethylene glycol (MIRALAX / GLYCOLAX) packet Take 17 g by mouth daily as needed for moderate  constipation.    [provider]  sacubitril-valsartan (ENTRESTO) 24-26 MG Take 1 tablet by mouth 2 (two) times daily. 01/28/20   Skeet Latch, MD  spironolactone (ALDACTONE) 25 MG tablet TAKE 1/2 TABLET EVERY DAY 11/10/19   Skeet Latch, MD  warfarin (COUMADIN) 5 MG tablet TAKE AS DIRECTED BY COUMADIN CLINIC. NEED OFFICE VISIT WITH FASTING LABS FOR REFILLS Patient taking differently: Take 5 mg by mouth one time only at 6 PM. Take 1 tablet (5mg ) po daily at [facility]. 09/03/18   Jacelyn Pi, Lilia Argue, MD    Allergies    Carlton Adam [regadenoson]  Review of Systems   Review of Systems  Unable to perform ROS: Patient nonverbal    Physical Exam Updated Vital Signs BP (!) 131/32 (BP Location: Right Arm)   Pulse 70   Temp 99 F (37.2 C) (Rectal)   Resp 12   SpO2 96%   Physical Exam Vitals and nursing note reviewed.  HENT:     Head: Atraumatic.     Right Ear: External ear normal.     Left Ear: External ear normal.     Mouth/Throat:     Mouth: Mucous membranes are moist.  Eyes:     General: No scleral icterus. Cardiovascular:     Comments: Pulse with pacing. Pulmonary:     Breath sounds: No wheezing or rhonchi.  Musculoskeletal:        General: No tenderness.     Cervical back: Neck supple.     Comments: Right foot/ankle in Ace wrap.  Brace on right wrist.  Skin:    Comments: Skin is cool.  Neurological:     Mental Status: She is alert.     Comments: Will follow commands and nod yes or no.     ED Results / Procedures / Treatments   Labs (all labs ordered are listed, but only abnormal results are displayed) Labs Reviewed  I-STAT CHEM 8, ED - Abnormal; Notable for the following components:      Result Value   Glucose, Bld 228 (*)    Calcium, Ion 1.02 (*)    TCO2 16 (*)    Hemoglobin 11.2 (*)    HCT 33.0 (*)    All other components within normal limits  RESP PANEL BY RT-PCR (FLU A&B, COVID) ARPGX2  CBC WITH DIFFERENTIAL/PLATELET  COMPREHENSIVE METABOLIC  PANEL  PROTIME-INR  DIGOXIN LEVEL  TROPONIN I (HIGH SENSITIVITY)    EKG EKG Interpretation  Date/Time:  Thursday April 21 2020 08:32:34 EDT Ventricular Rate:  0 PR Interval:    QRS Duration:   QT Interval:    QTC Calculation:   R Axis:   0 Text Interpretation: 3rd degree block with  minimalescape Confirmed by Davonna Belling [phone]) on 04/21/2020 8:49:51 AM  Radiology No results found.  Procedures Procedures   Medications Ordered in ED Medications - No data to display  ED Course  I have reviewed the triage vital signs and the nursing notes.  Pertinent labs & imaging results that were available during my care of the patient were reviewed by me and considered in my medical decision making (see chart for details).    MDM Rules/Calculators/A&P                          Patient presents in third-degree heart block.  Reportedly started this morning.  Patient is actually being paced well.  Seems to be tolerating it well.  I-STAT Chem-8 reassuring without hyperkalemia.  Patient is on dig and also on Coumadin.  Cardiology emergently consulted and seen by electrophysiology.  Plan for at least a pacer wire potentially pacemaker.  However patient appears relatively stable at this time.  Awake and is mentating.  Will wait for results of some laboratory work to look for dig toxicity or severely elevated INR.  CRITICAL CARE Performed by: Davonna Belling Total critical care time: 30 minutes Critical care time was exclusive of separately billable procedures and treating other patients. Critical care was necessary to treat or prevent imminent or life-threatening deterioration. Critical care was time spent personally by me on the following activities: development of treatment plan with patient and/or surrogate as well as nursing, discussions with consultants, evaluation of patient's response to treatment, examination of patient, obtaining history from patient or surrogate, ordering and  performing treatments and interventions, ordering and review of laboratory studies, ordering and review of radiographic studies, pulse oximetry and re-evaluation of patient's condition.   Final Clinical Impression(s) / ED Diagnoses Final diagnoses:  Third degree heart block [facility])    Rx / DC Orders ED Discharge Orders    None       Davonna Belling, MD 04/21/20 1510

## 2020-04-21 NOTE — Interval H&P Note (Signed)
History and Physical Interval Note:  04/21/2020 10:21 AM  [name]  has presented today for surgery, with the diagnosis of chest pain.  The various methods of treatment have been discussed with the patient and family. After consideration of risks, benefits and other options for treatment, the patient has consented to  Procedure(s): TEMPORARY PACEMAKER (N/A) LEFT HEART CATH AND CORONARY ANGIOGRAPHY (N/A) as a surgical intervention.  The patient's history has been reviewed, patient examined, no change in status, stable for surgery.  I have reviewed the patient's chart and labs.  Questions were answered to the patient's satisfaction.     [name]

## 2020-04-21 NOTE — Progress Notes (Signed)
  Echocardiogram 2D Echocardiogram has been performed.  [name] 04/21/2020, 4:52 PM

## 2020-04-21 NOTE — Progress Notes (Signed)
Date and time results received: 04/21/20 1518  Test: Troponin Critical Value: 124  Name of Provider Notified: Loralie Champagne  Orders Received? Or Actions Taken?: No new orders received

## 2020-04-21 NOTE — Consult Note (Addendum)
Cardiology Consultation:   Patient ID: [name] MRN: [id number]; DOB: 08-Oct-1950  Admit date: 04/21/2020 Date of Consult: 04/21/2020  PCP:  Just, [name], [place]  Cardiologist:  Skeet Latch, MD     Patient Profile:   [name] is a 70 y.o. female with a hx of HTN, HLD, stroke (expessive aphasia and R sided deficit), sickle cell anemia, seizure d/o, chronic HF (systolic),  who is being seen today for the evaluation of CHB at the request of Dr. Aundra Dubin.  History of Present Illness:   [name] was in her USOH until the day of her admission was seated her husband noted her making funny noises and appeared to be having a seizure, he called EMS who found her in CHB, she was transcutaneously paced in-route Cardiology was called, and emergent transvenous pacing wire was placed. She has hx of a CM with abnormal stress myoview in 2020 (large severe fixed inferior, anterior apical, and inferolateral perfusion defect suggestive of scar without ischemia, EF 34%), echo 35-40% Her INR was 2.2 and LHC was deferred.  Her home digoxin and coreg were held.  EP was asked to the case.  Overnight she has regained conduction 1:1  LABS K+ 3.8 > 3.5 >> 3.9 BUN/Creat 15/1.08 HS Trop 70 > 124 WBC 10.0 H/H 12/38 Plts 303  Home meds include: Coreg 6.25mg  BID Dig 0.125mg  daily No other potential nodal blockers appreciated   The patient at baseline has expressive aphasia. She is able to tell me no to any kind of CP, palpitations or SOB now, yesterday or of late  Past Medical History:  Diagnosis Date  . Adenomatous polyp of colon 09/2012   repeat colonoscopy in 5 years by Dr. Sharlett Iles  . CHF (congestive heart failure) (HCC)    EF 15-20% as of 05/28/11 (Dr [facility] D[facility]C summary)  . CVA (cerebral infarction) 12/2007  . Hemiparesis ([place])   . Hyperlipidemia   . Hypertension   . Seizures ([place])   . Sickle cell anemia  (HCC)   . Stroke ([place])   . Vitamin D deficiency 2010    Past Surgical History:  Procedure Laterality Date  . ABDOMINAL HYSTERECTOMY    . FRACTURE SURGERY    . HIP SURGERY    . TEMPORARY PACEMAKER N/A 04/21/2020   Procedure: TEMPORARY PACEMAKER;  Surgeon: Larey Dresser, MD;  Location: [facility];  Service: Cardiovascular;  Laterality: N/A;  . TUBAL LIGATION       Home Medications:  Prior to Admission medications   Medication Sig Start Date End Date Taking? Authorizing Provider  atorvastatin (LIPITOR) 80 MG tablet TAKE 1 TABLET AT BEDTIME 07/04/19  Yes Jacelyn Pi, Lilia Argue, MD  baclofen (LIORESAL) 20 MG tablet TAKE 1 TABLET THREE TIMES DAILY (MAY TAKE AN ADDITIONAL TABLET MID-DAY OR BEFORE BED AS NEEDED FOR MUSCLE SPASMS) 03/07/20  Yes Just, [name], FNP  carvedilol (COREG) 6.25 MG tablet Take 1 tablet (6.25 mg total) by mouth 2 (two) times daily. 01/11/20  Yes Just, [name], FNP  Cholecalciferol 50 MCG (2000 UT) TABS Take by mouth.   Yes [provider]  digoxin (LANOXIN) 0.125 MG tablet TAKE 1 TABLET(125 MCG) BY MOUTH DAILY 03/22/20  Yes Just, [name], FNP  Lacosamide 100 MG TABS Take 1 tablet (100 mg total) by mouth 2 (two) times daily. 12/30/19  Yes Penumalli, Earlean Polka, MD  levETIRAcetam (KEPPRA) 750 MG tablet Take 2 tablets (1,500 mg  total) by mouth 2 (two) times daily. 12/30/19 03/24/21 Yes Penumalli, Earlean Polka, MD  sacubitril-valsartan (ENTRESTO) 24-26 MG Take 1 tablet by mouth 2 (two) times daily. 01/28/20  Yes Skeet Latch, MD  spironolactone (ALDACTONE) 25 MG tablet TAKE 1/2 TABLET EVERY DAY 11/10/19  Yes Skeet Latch, MD  warfarin (COUMADIN) 5 MG tablet TAKE AS DIRECTED BY COUMADIN CLINIC. NEED OFFICE VISIT WITH FASTING LABS FOR REFILLS Patient taking differently: Take 5 mg by mouth one time only at 6 PM. Take 1 tablet (5mg ) po daily at 6PM. 09/03/18  Yes Jacelyn Pi, Lilia Argue, MD  furosemide (LASIX) 40 MG tablet Take 1 tablet (40 mg total) by mouth daily.  02/26/19   Jacelyn Pi, Lilia Argue, MD  polyethylene glycol [facility] / Floria Raveling) packet Take 17 g by mouth daily as needed for moderate constipation.    [provider]    Inpatient Medications: Scheduled Meds: . [START ON 04/22/2020] aspirin  81 mg Oral Pre-Cath  . atorvastatin  80 mg Oral QHS  . [START ON 04/22/2020] baclofen  20 mg Oral BID  . Chlorhexidine Gluconate Cloth  6 each Topical Daily  . furosemide  40 mg Oral Daily  . lacosamide  100 mg Oral BID  . levETIRAcetam  1,500 mg Oral BID  . sodium chloride flush  3 mL Intravenous Q12H   Continuous Infusions: . sodium chloride    . [START ON 04/22/2020] sodium chloride     PRN Meds: sodium chloride, acetaminophen, nitroGLYCERIN, ondansetron (ZOFRAN) IV, polyethylene glycol, sodium chloride flush  Allergies:    Allergies  Allergen Reactions  . Lexiscan [Regadenoson] Other (See Comments)    Perioral tingling and throat tightness    Social History:   Social History   Socioeconomic History  . Marital status: Married    Spouse name: [name]  . Number of children: 2  . Years of education: 68  . Highest education level: Not on file  Occupational History    Employer: RETIRED  Tobacco Use  . Smoking status: Former Smoker    Types: Cigarettes    Quit date: 09/26/2007    Years since quitting: 12.5  . Smokeless tobacco: Never Used  Substance and Sexual Activity  . Alcohol use: No  . Drug use: No  . Sexual activity: Not Currently  Other Topics Concern  . Not on file  Social History Narrative   Lives with husband   Social Determinants of Health   Financial Resource Strain: Not on file  Food Insecurity: Not on file  Transportation Needs: Not on file  Physical Activity: Not on file  Stress: Not on file  Social Connections: Not on file  Intimate Partner Violence: Not on file    Family History:   Family History  Problem Relation Age of Onset  . Diabetes Daughter   . Colon cancer Maternal Grandfather      ROS:   As above  Physical Exam/Data:   Vitals:   04/21/20 1315 04/21/20 1320 04/21/20 1325 04/21/20 1400  BP: (!) 118/56 (!) 99/54 (!) 112/57 90/68  Pulse: 80 81 80 78  Resp: 13 15 14 16   Temp:    98.6 F (37 C)  TempSrc:    Oral  SpO2: 100% 100% 100% 97%  Weight:      Height:       No intake or output data in the 24 hours ending 04/21/20 1543 Last 3 Weights 04/21/2020 04/12/2020 03/02/2020  Weight (lbs) 125 lb 10.6 oz (No Data) (No Data)  Weight (  kg) 57 kg (No Data) (No Data)     Body mass index is 19.68 kg/m.  General:  Well nourished, well developed, in no acute distress HEENT: normal Lymph: no adenopathy Neck: no JVD Endocrine:  No thryomegaly Vascular: No carotid bruits  Cardiac:  RRR; no murmurs, gallops or rubs Lungs:  CTA b/l, no wheezing, rhonchi or rales  Abd: soft, nontender  Ext: no edema Musculoskeletal:  No deformities Skin: warm and dry  Neuro:  Known expressive aphasia though able to communicate with simple 1-2 words, no, nods for yes, especially Known baseline R hemiplegia Psych:  Normal affect   EKG:  The EKG was personally reviewed and demonstrates:    Presented CHB with a couple escape beats, V standstill otherwise Today  V paced 80bpm >> SR 67bpm, RBBB, LAD  OLD: 11/23/19: SR 60bpm, RBBB, LAD  Telemetry:  Telemetry was personally reviewed and demonstrates:   SR 60's currently    Relevant CV Studies:  04/21/20: TTE IMPRESSIONS  1. Left ventricular ejection fraction, by estimation, is 35 to 40%. The  left ventricle has moderately decreased function. The left ventricle  demonstrates regional wall motion abnormalities (see scoring  diagram/findings for description). Left ventricular  diastolic parameters are consistent with Grade I diastolic dysfunction  (impaired relaxation). Elevated left ventricular end-diastolic pressure.  There is severe hypokinesis of the left ventricular, entire inferior wall  and lateral wall.  2. Right ventricular  systolic function is normal. The right ventricular  size is normal. There is normal pulmonary artery systolic pressure.  3. Left atrial size was mildly dilated.  4. Right atrial size was mildly dilated.  5. The mitral valve is grossly normal. Trivial mitral valve  regurgitation.  6. The aortic valve is tricuspid. There is mild calcification of the  aortic valve. There is mild thickening of the aortic valve. Aortic valve  regurgitation is moderate. Mild to moderate aortic valve  sclerosis/calcification is present, without any  evidence of aortic stenosis.  7. The inferior vena cava is normal in size with greater than 50%  respiratory variability, suggesting right atrial pressure of 3 mmHg.   Comparison(s): No significant change from prior study.   09/20/2018: stress myoview  Downsloping ST segment depression ST segment depression of 0.5 mm was noted during stress in the II, V4, V5 and V6 leads, and returning to baseline after 1-5 minutes of recovery.  Defect 1: There is a large defect of severe severity present in the basal inferior, basal inferolateral, mid inferior, mid inferolateral, apical septal, apical inferior, apical lateral and apex location.  Findings consistent with prior myocardial infarction with peri-infarct ischemia.  This is a high risk study.  Nuclear stress EF: 34%.   Large size, severe severity mostly fixed (SDS 3) inferior, inferoapical and inferolateral perfusion defect suggestive of scar without significant peri-infarct ischemia. LVEF 34% with inferior, inferoapical and inferolateral akinesis. This is a high risk study due to low LVEF, however, no significant reversible ischemia is noted.   Echo 09/19/18: 1. The left ventricle has moderately reduced systolic function, with an  ejection fraction of 35-40%. The cavity size was normal. There is  moderately increased left ventricular wall thickness. Left ventricular  diastolic Doppler parameters are consistent   with impaired relaxation. Elevated left atrial and left ventricular  end-diastolic pressures The E/e' is >20.  2. Moderate hypokinesis of the left ventricular, entire inferior wall and  lateral wall.  3. The right ventricle has normal systolic function. The cavity was  normal. There  is no increase in right ventricular wall thickness.  4. Left atrial size was mildly dilated.  5. The mitral valve is grossly normal.  6. The tricuspid valve is grossly normal.  7. The aortic valve is tricuspid. Moderate sclerosis of the aortic valve.  Aortic valve regurgitation is moderate by color flow Doppler. No stenosis  of the aortic valve.  8. The aorta is normal unless otherwise noted.    Laboratory Data:  High Sensitivity Troponin:   Recent Labs  Lab 04/21/20 0825 04/21/20 1418  TROPONINIHS 70* 124*     Chemistry Recent Labs  Lab 04/21/20 0825 04/21/20 0832  NA 138 138  K 3.8 3.6  CL 106 107  CO2 16*  --   GLUCOSE 244* 228*  BUN 16 16  CREATININE 1.25* 1.00  CALCIUM 8.4*  --   GFRNONAA 46*  --   ANIONGAP 16*  --     Recent Labs  Lab 04/21/20 0825  PROT 6.5  ALBUMIN 3.3*  AST 28  ALT 15  ALKPHOS 95  BILITOT 0.4   Hematology Recent Labs  Lab 04/21/20 0825 04/21/20 0832  WBC 9.2  --   RBC 3.86*  --   HGB 11.1* 11.2*  HCT 37.0 33.0*  MCV 95.9  --   MCH 28.8  --   MCHC 30.0  --   RDW 14.0  --   PLT 322  --    BNPNo results for input(s): BNP, PROBNP in the last 168 hours.  DDimer No results for input(s): DDIMER in the last 168 hours.   Radiology/Studies:   DG Chest Portable 1 View Result Date: 04/21/2020 CLINICAL DATA:  70 year old female with weakness, syncope, wide complex bradycardia. EXAM: PORTABLE CHEST 1 VIEW COMPARISON:  Portable chest 09/18/2018 and earlier. FINDINGS: Portable AP semi upright view at 0900 hours. Pacer/resuscitation pads project over the left chest. Larger lung volumes. Mediastinal contours are stable and within normal limits.  Visualized tracheal air column is within normal limits. Allowing for portable technique the lungs are clear. No pneumothorax identified, linear external artifact projecting over the outer right lung. No acute osseous abnormality identified. IMPRESSION: No acute cardiopulmonary abnormality. Electronically Signed   By: Genevie Ann M.D.   On: 04/21/2020 09:08     Assessment and Plan:   1. CHB with profound bradycardia and V standstill     S/p emergent tamp pacing wire yesterday  meds held on admission Digoxin 1/2 life is 36-48hours Coreg 1/2 life is 7-10hours  She has regained 1:1 conduction Would keep her off digoxin for sure, ? Coreg, Mosetta Ferdinand discuss further with Dr. Curt Bears. For now at least, no plans/need  for PPM  2. CM     unknown etiology, abnormal stress test, planned for cath today      Continue with primary cardiology /HF team Dr. Curt Bears Lamerle Jabs see later today   Risk Assessment/Risk Scores:  {  For questions or updates, please contact [facility] Please consult [email or web address] for contact info under    Signed, Baldwin Jamaica, PA-C  04/21/2020 3:43 PM  I have seen and examined this patient with Tommye Standard.  Agree with above, note added to reflect my findings.  On exam, RRR, no murmurs.  Patient was admitted to the hospital yesterday after being found to have complete heart block and long episodes of ventricular standstill.  She had a temporary pacer wire placed.  Fortunately, her AV conduction has recovered after holding carvedilol and digoxin.  We Fayne Mcguffee continue to hold these.  If it appears that she needs carvedilol for her heart failure, she would likely need pacemaker implant.  For now, would hold off on these medications.  Further work-up as an outpatient.  Marcy Bogosian M. Guhan Bruington MD 04/22/2020 12:07 PM

## 2020-04-21 NOTE — Plan of Care (Signed)
  Problem: Education: Goal: Knowledge of General Education information will improve Description: Including pain rating scale, medication(s)/side effects and non-pharmacologic comfort measures Outcome: Progressing   Problem: Health Behavior/Discharge Planning: Goal: Ability to manage health-related needs will improve Outcome: Progressing   Problem: Clinical Measurements: Goal: Ability to maintain clinical measurements within normal limits will improve Outcome: Progressing Goal: Will remain free from infection Outcome: Progressing Goal: Diagnostic test results will improve Outcome: Progressing Goal: Respiratory complications will improve Outcome: Progressing Goal: Cardiovascular complication will be avoided Outcome: Progressing   Problem: Activity: Goal: Risk for activity intolerance will decrease Outcome: Progressing   Problem: Nutrition: Goal: Adequate nutrition will be maintained Outcome: Progressing   Problem: Coping: Goal: Level of anxiety will decrease Outcome: Progressing   Problem: Elimination: Goal: Will not experience complications related to bowel motility Outcome: Progressing Goal: Will not experience complications related to urinary retention Outcome: Progressing   Problem: Pain Managment: Goal: General experience of comfort will improve Outcome: Progressing   Problem: Safety: Goal: Ability to remain free from injury will improve Outcome: Progressing   Problem: Skin Integrity: Goal: Risk for impaired skin integrity will decrease Outcome: Progressing   Problem: Education: Goal: Understanding of CV disease, CV risk reduction, and recovery process will improve Outcome: Progressing   Problem: Activity: Goal: Ability to return to baseline activity level will improve Outcome: Progressing   Problem: Cardiovascular: Goal: Ability to achieve and maintain adequate cardiovascular perfusion will improve Outcome: Progressing Goal: Vascular access site(s)  Level 0-1 will be maintained Outcome: Progressing   Problem: Health Behavior/Discharge Planning: Goal: Ability to safely manage health-related needs after discharge will improve Outcome: Progressing

## 2020-04-22 ENCOUNTER — Encounter (HOSPITAL_COMMUNITY)
Admission: EM | Disposition: A | Discharge status: Skilled Nursing Facility | Payer: Self-pay | Source: Home / Self Care | Attending: Thoracic Surgery (Cardiothoracic Vascular Surgery)

## 2020-04-22 DIAGNOSIS — R4701 Aphasia: Secondary | ICD-10-CM

## 2020-04-22 DIAGNOSIS — I5022 Chronic systolic (congestive) heart failure: Secondary | ICD-10-CM

## 2020-04-22 DIAGNOSIS — I442 Atrioventricular block, complete: Secondary | ICD-10-CM | POA: Diagnosis not present

## 2020-04-22 DIAGNOSIS — I2510 Atherosclerotic heart disease of native coronary artery without angina pectoris: Secondary | ICD-10-CM

## 2020-04-22 HISTORY — PX: LEFT HEART CATH AND CORONARY ANGIOGRAPHY: CATH118249

## 2020-04-22 LAB — CBC
HCT: 38 % (ref 36.0–46.0)
Hemoglobin: 12.4 g/dL (ref 12.0–15.0)
MCH: 28.2 pg (ref 26.0–34.0)
MCHC: 32.6 g/dL (ref 30.0–36.0)
MCV: 86.4 fL (ref 80.0–100.0)
Platelets: 303 10*3/uL (ref 150–400)
RBC: 4.4 MIL/uL (ref 3.87–5.11)
RDW: 13.9 % (ref 11.5–15.5)
WBC: 10 10*3/uL (ref 4.0–10.5)
nRBC: 0 % (ref 0.0–0.2)

## 2020-04-22 LAB — LIPID PANEL
Cholesterol: 124 mg/dL (ref 0–200)
HDL: 41 mg/dL (ref >40)
LDL Cholesterol: 64 mg/dL (ref 0–99)
Total CHOL/HDL Ratio: 3 RATIO
Triglycerides: 94 mg/dL (ref <150)
VLDL: 19 mg/dL (ref 0–40)

## 2020-04-22 LAB — BASIC METABOLIC PANEL
Anion gap: 9 (ref 5–15)
BUN: 15 mg/dL (ref 8–23)
CO2: 26 mmol/L (ref 22–32)
Calcium: 8.9 mg/dL (ref 8.9–10.3)
Chloride: 103 mmol/L (ref 98–111)
Creatinine, Ser: 1.08 mg/dL — ABNORMAL HIGH (ref 0.44–1.00)
GFR, Estimated: 55 mL/min — ABNORMAL LOW (ref >60)
Glucose, Bld: 101 mg/dL — ABNORMAL HIGH (ref 70–99)
Potassium: 3.9 mmol/L (ref 3.5–5.1)
Sodium: 138 mmol/L (ref 135–145)

## 2020-04-22 LAB — PROTIME-INR
INR: 2 — ABNORMAL HIGH (ref 0.8–1.2)
INR: 2.1 — ABNORMAL HIGH (ref 0.8–1.2)
Prothrombin Time: 21.9 seconds — ABNORMAL HIGH (ref 11.4–15.2)
Prothrombin Time: 23 seconds — ABNORMAL HIGH (ref 11.4–15.2)

## 2020-04-22 LAB — HEMOGLOBIN A1C
Hgb A1c MFr Bld: 6.1 % — ABNORMAL HIGH (ref 4.8–5.6)
Mean Plasma Glucose: 128.37 mg/dL

## 2020-04-22 SURGERY — LEFT HEART CATH AND CORONARY ANGIOGRAPHY
Anesthesia: LOCAL

## 2020-04-22 MED ORDER — HEPARIN (PORCINE) 25000 UT/250ML-% IV SOLN: 750.0000 [IU]/h | INTRAVENOUS | Status: DC

## 2020-04-22 MED ORDER — VERAPAMIL HCL 2.5 MG/ML IV SOLN
INTRAVENOUS | Status: AC
  Filled 2020-04-22: qty 2

## 2020-04-22 MED ORDER — SODIUM CHLORIDE 0.9 % IV SOLN: INTRAVENOUS | Status: AC

## 2020-04-22 MED ORDER — SODIUM CHLORIDE 0.9% FLUSH: 3.0000 mL | INTRAVENOUS | Status: DC | PRN

## 2020-04-22 MED ORDER — SODIUM CHLORIDE 0.9 % IV SOLN: 250.0000 mL | INTRAVENOUS | Status: DC | PRN

## 2020-04-22 MED ORDER — SODIUM CHLORIDE 0.9% FLUSH
3.0000 mL | Freq: Two times a day (BID) | INTRAVENOUS | Status: DC
  Administered 2020-04-23 – 2020-04-25 (×5): 3 mL via INTRAVENOUS

## 2020-04-22 MED ORDER — POTASSIUM CHLORIDE CRYS ER 20 MEQ PO TBCR: 40.0000 meq | EXTENDED_RELEASE_TABLET | Freq: Once | ORAL | Status: DC

## 2020-04-22 MED ORDER — HEPARIN SODIUM (PORCINE) 1000 UNIT/ML IJ SOLN
INTRAMUSCULAR | Status: DC | PRN
  Administered 2020-04-22: 3000 [IU] via INTRAVENOUS

## 2020-04-22 MED ORDER — FENTANYL CITRATE (PF) 100 MCG/2ML IJ SOLN
INTRAMUSCULAR | Status: DC | PRN
  Administered 2020-04-22: 25 ug via INTRAVENOUS

## 2020-04-22 MED ORDER — HEPARIN (PORCINE) IN NACL 1000-0.9 UT/500ML-% IV SOLN
INTRAVENOUS | Status: DC | PRN
  Administered 2020-04-22 (×2): 500 mL

## 2020-04-22 MED ORDER — ACETAMINOPHEN 325 MG PO TABS
650.0000 mg | ORAL_TABLET | ORAL | Status: DC | PRN
  Administered 2020-04-22 – 2020-04-23 (×2): 650 mg via ORAL
  Filled 2020-04-22 (×2): qty 2

## 2020-04-22 MED ORDER — MIDAZOLAM HCL 2 MG/2ML IJ SOLN
INTRAMUSCULAR | Status: DC | PRN
  Administered 2020-04-22: 1 mg via INTRAVENOUS

## 2020-04-22 MED ORDER — LABETALOL HCL 5 MG/ML IV SOLN: 10.0000 mg | INTRAVENOUS | Status: AC | PRN

## 2020-04-22 MED ORDER — HEPARIN (PORCINE) IN NACL 1000-0.9 UT/500ML-% IV SOLN
INTRAVENOUS | Status: AC
  Filled 2020-04-22: qty 1000

## 2020-04-22 MED ORDER — MIDAZOLAM HCL 2 MG/2ML IJ SOLN
INTRAMUSCULAR | Status: AC
  Filled 2020-04-22: qty 2

## 2020-04-22 MED ORDER — LIDOCAINE HCL (PF) 1 % IJ SOLN
INTRAMUSCULAR | Status: DC | PRN
  Administered 2020-04-22: 2 mL via INTRADERMAL

## 2020-04-22 MED ORDER — POTASSIUM CHLORIDE 20 MEQ PO PACK
40.0000 meq | PACK | Freq: Once | ORAL | Status: AC
  Administered 2020-04-22: 40 meq via ORAL
  Filled 2020-04-22: qty 2

## 2020-04-22 MED ORDER — FENTANYL CITRATE (PF) 100 MCG/2ML IJ SOLN
INTRAMUSCULAR | Status: AC
  Filled 2020-04-22: qty 2

## 2020-04-22 MED ORDER — ONDANSETRON HCL 4 MG/2ML IJ SOLN: 4.0000 mg | Freq: Four times a day (QID) | INTRAMUSCULAR | Status: DC | PRN

## 2020-04-22 MED ORDER — HYDRALAZINE HCL 20 MG/ML IJ SOLN: 10.0000 mg | INTRAMUSCULAR | Status: AC | PRN

## 2020-04-22 MED ORDER — VERAPAMIL HCL 2.5 MG/ML IV SOLN
INTRAVENOUS | Status: DC | PRN
  Administered 2020-04-22: 10 mL via INTRA_ARTERIAL

## 2020-04-22 MED ORDER — LIDOCAINE HCL (PF) 1 % IJ SOLN
INTRAMUSCULAR | Status: AC
  Filled 2020-04-22: qty 30

## 2020-04-22 MED ORDER — HEPARIN SODIUM (PORCINE) 1000 UNIT/ML IJ SOLN
INTRAMUSCULAR | Status: AC
  Filled 2020-04-22: qty 1

## 2020-04-22 SURGICAL SUPPLY — 11 items
CATH INFINITI 5FR MULTPACK ANG (CATHETERS) ×1 IMPLANT
DEVICE RAD TR BAND REGULAR (VASCULAR PRODUCTS) ×1 IMPLANT
GLIDESHEATH SLEND SS 6F .021 (SHEATH) ×1 IMPLANT
GUIDEWIRE INQWIRE 1.5J.035X260 (WIRE) IMPLANT
INQWIRE 1.5J .035X260CM (WIRE) ×2
KIT HEART LEFT (KITS) ×2 IMPLANT
KIT MICROINTRODUCER 5F 7206 (SHEATH) ×1 IMPLANT
PACK CARDIAC CATHETERIZATION (CUSTOM PROCEDURE TRAY) ×2 IMPLANT
TRANSDUCER W/STOPCOCK (MISCELLANEOUS) ×2 IMPLANT
TUBING CIL FLEX 10 FLL-RA (TUBING) ×2 IMPLANT
WIRE HI TORQ VERSACORE-J 145CM (WIRE) ×1 IMPLANT

## 2020-04-22 NOTE — Consult Note (Addendum)
[address]       [place] [address]             [phone]        Amery Moore [place] [facility] Medical Record #[id number] Date of Birth: 1950-11-12  Referring: No ref. provider found Primary Care: Just, [name], FNP Primary Cardiologist:Tiffany Oval [name], MD  Chief Complaint:   Pt presented with complete heart block.   History of Present Illness:     [name] is aphasic.  This interview was conducted with her husband who was at the bedside.    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  She also has a seizure disorder that is managed with Keppra.  Other past medical history include hypertension, dyslipidemia, and sickle cell anemia.  She has known cardiomyopathy with chronic combined diastolic and systolic heart failure.  She had an abnormal Myoview stress test in 2020 that showed large severe fixed inferior, anterior apical, and inferolateral perfusion defects.  Ejection fraction was estimated at 34%.  [name] was in her usual state of health until yesterday when her husband witnessed what he thought was a seizure.  [name] had just gotten dressed to go out with her husband and was putting a scarf around her neck when he noticed her making gurgling sounds.  He noticed that she had slumped into a chair but had not lost consciousness.  EMS was summonedand a cardiac monitor was placed. She was found to be in complete heart block.  She was transported to [facility] ED while being percutaneously paced. She can not remember if she had any chest pain during this episode.  Once in the emergency room, a transvenous lead was placed emergently and pacing continued.  She was admitted to the hospital for further work-up and monitoring.  Her digoxin and carvedilol that she had been taking regularly  were held.  Since admission, she is regained one-to-one conduction.  The heart failure team was consulted.  Left heart catheterization was recommended and was carried out earlier today.  This demonstrates a 70% distal left main coronary artery stenosis along with a 70% stenosis in the proximal circumflex.  Repeat echo has been performed this admission and redemonstrates an ejection fraction of around 35%.  There is moderate aortic valve sclerosis without significant stenosis.  The mitral valve had trivial insufficiency, no significant stenosis.  [name] is aphasic and has right hemiplegia.  According to her husband and chart review, she continues to function well at home taking care of her own activities of daily living. She washes and dresses herself, cooks, irons clothing and ambulates with a cane.    Mr. [place] reports that his wife is extensive gum disease and full mouth extraction has been scheduled on few occasions.  Apparently these plans have been delayed each time due to Covid restrictions.  CT surgery has been asked to evaluate [name] for consideration of coronary bypass grafting.    Current Activity/ Functional Status:   Zubrod Score: At the time of surgery this patient's most appropriate activity status/level should be described as: []     0    Normal activity, no symptoms []     1    Restricted in physical strenuous activity but ambulatory, able to do out light work [x]     2  Ambulatory and capable of self care, unable to do work activities, up and about                 more than 50%  Of the time                            []     3    Only limited self care, in bed greater than 50% of waking hours []     4    Completely disabled, no self care, confined to bed or chair []     5    Moribund  Past Medical History:  Diagnosis Date  . Adenomatous polyp of colon 09/2012   repeat colonoscopy in 5 years by Dr. Sharlett Iles  . CHF (congestive heart failure) (HCC)    EF 15-20% as of 05/28/11  (Dr Legrand [facility] summary)  . CVA (cerebral infarction) 12/2007  . Hemiparesis ([place])   . Hyperlipidemia   . Hypertension   . Seizures ([place])   . Sickle cell anemia (HCC)   . Stroke ([place])   . Vitamin D deficiency 2010    Past Surgical History:  Procedure Laterality Date  . ABDOMINAL HYSTERECTOMY    . FRACTURE SURGERY    . HIP SURGERY    . TEMPORARY PACEMAKER N/A 04/21/2020   Procedure: TEMPORARY PACEMAKER;  Surgeon: Larey Dresser, MD;  Location: [facility];  Service: Cardiovascular;  Laterality: N/A;  . TUBAL LIGATION      Social History   Tobacco Use  Smoking Status Former Smoker  . Types: Cigarettes  . Quit date: 09/26/2007  . Years since quitting: 12.5  Smokeless Tobacco Never Used    Social History   Substance and Sexual Activity  Alcohol Use No     Allergies  Allergen Reactions  . Lexiscan [Regadenoson] Other (See Comments)    Perioral tingling and throat tightness    Current Facility-Administered Medications  Medication Dose Route Frequency Provider Last Rate Last Admin  . 0.9 %  sodium chloride infusion   Intravenous Continuous Larey Dresser, MD 75 mL/hr at 04/22/20 1500 Infusion Verify at 04/22/20 1500  . 0.9 %  sodium chloride infusion  250 mL Intravenous PRN Larey Dresser, MD      . acetaminophen (TYLENOL) tablet 650 mg  650 mg Oral Q4H PRN Larey Dresser, MD      . atorvastatin (LIPITOR) tablet 80 mg  80 mg Oral QHS Ledora Bottcher, [place]   80 mg at 04/21/20 2125  . baclofen (LIORESAL) tablet 20 mg  20 mg Oral BID Ledora Bottcher, PA   20 mg at 04/22/20 0954  . Chlorhexidine Gluconate Cloth 2 % PADS 6 each  6 each Topical Daily Chandrasekhar, Mahesh A, MD   6 each at 04/22/20 0400  . furosemide (LASIX) tablet 40 mg  40 mg Oral Daily Ledora Bottcher, [place]   40 mg at 04/22/20 1610  . heparin ADULT infusion 100 units/mL (25000 units/231mL)  750 Units/hr Intravenous Continuous Carney, Gay Filler, RPH      . hydrALAZINE  (APRESOLINE) injection 10 mg  10 mg Intravenous Q20 Min PRN Larey Dresser, MD      . labetalol (NORMODYNE) injection 10 mg  10 mg Intravenous Q10 min PRN Larey Dresser, MD      . lacosamide (VIMPAT) tablet 100 mg  100 mg Oral BID Ledora Bottcher, PA   100 mg at  04/22/20 0953  . levETIRAcetam (KEPPRA) tablet 1,500 mg  1,500 mg Oral BID Ledora Bottcher, PA   1,500 mg at 04/22/20 0263  . nitroGLYCERIN (NITROSTAT) SL tablet 0.4 mg  0.4 mg Sublingual Q5 Min x 3 PRN Ledora Bottcher, PA      . ondansetron [facility]) injection 4 mg  4 mg Intravenous Q6H PRN Larey Dresser, MD      . polyethylene glycol (MIRALAX / GLYCOLAX) packet 17 g  17 g Oral Daily PRN Ledora Bottcher, PA      . sodium chloride flush (NS) 0.9 % injection 3 mL  3 mL Intravenous Q12H Larey Dresser, MD      . sodium chloride flush (NS) 0.9 % injection 3 mL  3 mL Intravenous PRN Larey Dresser, MD      . spironolactone (ALDACTONE) tablet 12.5 mg  12.5 mg Oral Daily Larey Dresser, MD   12.5 mg at 04/22/20 7858    Medications Prior to Admission  Medication Sig Dispense Refill Last Dose  . atorvastatin (LIPITOR) 80 MG tablet TAKE 1 TABLET AT BEDTIME 90 tablet 2 04/20/2020 at Unknown time  . baclofen (LIORESAL) 20 MG tablet TAKE 1 TABLET THREE TIMES DAILY (MAY TAKE AN ADDITIONAL TABLET MID-DAY OR BEFORE BED AS NEEDED FOR MUSCLE SPASMS) 120 tablet 1 04/20/2020 at Unknown time  . carvedilol (COREG) 6.25 MG tablet Take 1 tablet (6.25 mg total) by mouth 2 (two) times daily. 180 tablet 0 04/20/2020 at Unknown time  . Cholecalciferol 50 MCG (2000 UT) TABS Take by mouth.   04/20/2020 at Unknown time  . digoxin (LANOXIN) 0.125 MG tablet TAKE 1 TABLET(125 MCG) BY MOUTH DAILY 90 tablet 0 04/20/2020 at Unknown time  . Lacosamide 100 MG TABS Take 1 tablet (100 mg total) by mouth 2 (two) times daily. 60 tablet 12 04/20/2020 at Unknown time  . levETIRAcetam (KEPPRA) 750 MG tablet Take 2 tablets (1,500 mg total) by mouth 2 (two)  times daily. 360 tablet 4 04/20/2020 at Unknown time  . sacubitril-valsartan (ENTRESTO) 24-26 MG Take 1 tablet by mouth 2 (two) times daily. 180 tablet 3 04/20/2020 at Unknown time  . spironolactone (ALDACTONE) 25 MG tablet TAKE 1/2 TABLET EVERY DAY 45 tablet 1 04/20/2020 at Unknown time  . warfarin (COUMADIN) 5 MG tablet TAKE AS DIRECTED BY COUMADIN CLINIC. NEED OFFICE VISIT WITH FASTING LABS FOR REFILLS (Patient taking differently: Take 5 mg by mouth one time only at 6 PM. Take 1 tablet (5mg ) po daily at [facility].) 90 tablet 0 04/20/2020 at Unknown time  . furosemide (LASIX) 40 MG tablet Take 1 tablet (40 mg total) by mouth daily. 30 tablet 0   . polyethylene glycol (MIRALAX / GLYCOLAX) packet Take 17 g by mouth daily as needed for moderate constipation.       Family History  Problem Relation Age of Onset  . Diabetes Daughter   . Colon cancer Maternal Grandfather      Review of Systems:   [name] is aphasic and I am unable to obtain any further review of systems other than what is mentioned in the HPI above    Cardiac Review of Systems: Y or  [    ]= no  Chest Pain [    ]  Resting SOB [   ] Exertional SOB  [  ]  Orthopnea [  ]   Pedal Edema [   ]    Palpitations [  ] Syncope  [  ]  Presyncope [   ]  General Review of Systems: [Y] = yes [  ]=no Constitional: recent weight change [  ]; anorexia [  ]; fatigue [  ]; nausea [  ]; night sweats [  ]; fever [  ]; or chills [  ]                                                               Dental: Last Dentist visit:   Eye : blurred vision [  ]; diplopia [   ]; vision changes [  ];  Amaurosis fugax[  ]; Resp: cough [  ];  wheezing[  ];  hemoptysis[  ]; shortness of breath[  ]; paroxysmal nocturnal dyspnea[  ]; dyspnea on exertion[  ]; or orthopnea[  ];  GI:  gallstones[  ], vomiting[  ];  dysphagia[  ]; melena[  ];  hematochezia [  ]; heartburn[  ];   Hx of  Colonoscopy[  ]; GU: kidney stones [  ]; hematuria[  ];   dysuria [  ];  nocturia[  ];   history of     obstruction [  ]; urinary frequency [  ]             Skin: rash, swelling[  ];, hair loss[  ];  peripheral edema[  ];  or itching[  ]; Musculosketetal: myalgias[  ];  joint swelling[  ];  joint erythema[  ];  joint pain[  ];  back pain[  ];  Heme/Lymph: bruising[  ];  bleeding[  ];  anemia[  ];  Neuro: TIA[  ];  headaches[  ];  stroke[  ];  vertigo[  ];  seizures[  ];   paresthesias[  ];  difficulty walking[  ];  Psych:depression[  ]; anxiety[  ];  Endocrine: diabetes[  ];  thyroid dysfunction[  ];                 Physical Exam: BP 97/64   Pulse 64   Temp 98.1 F (36.7 C) (Oral)   Resp 16   Ht 5\' 7"  (1.702 m)   Wt 55.5 kg   SpO2 98%   BMI 19.16 kg/m    General appearance: alert, cooperative and no distress Head: Normocephalic, without obvious abnormality, atraumatic Mouth: Dentition is in poor repair with multiple dental caries.  There is inflammation and retraction of the gums in all quadrants. Neck: no adenopathy, no carotid bruit, no JVD, supple, symmetrical, trachea midline and thyroid not enlarged, symmetric, no tenderness/mass/nodules Lymph nodes: There is no cervical or clavicular adenopathy Resp: Breath sounds are full, equal, and clear to auscultation. Cardio: Prominent S1-S2.  No murmurs. GI: Soft, nontender, active bowel sounds. Extremities: Extremities are cool to touch throughout.  The right upper extremity is stiff at all joints including the right shoulder.  It is held close to her chest and partially crosses the abdomen.  There is a TR band over the left radial artery.  There is a left foot drop.  No other deformities are noted. Neurologic: [name] is alert and attempts to cooperate with the interview and exam.  She attempts to answer questions with nodding her head but her husband points out that her answers are not always accurate.  She has right hemiparesis and  right foot drop.  No motor or sensory deficits are noted in the left  extremities.  Diagnostic Studies & Laboratory data:  LEFT HEART CATH AND CORONARY ANGIOGRAPHY    Conclusion    Dist LM lesion is 70% stenosed.  Ost Cx lesion is 70% stenosed.  Prox Cx lesion is 99% stenosed.   1. LVEDP 7. 2. 60-70% distal left main stenosis.  3. Heavily calcified proximal LCx.  60-70% stenosis at the ostium with discrete 99% stenosis in the proximal LCx.    I suspect that her CHB is related to chronic ischemia from LM-LCx disease.    Difficult situation.  She has surgical disease but seems to be a poor candidate for CABG with weakness and aphasia from prior stroke though per her husband, she does all ADLs and walks with cane at home.  PCI would be possible but high risk/difficult heavy calcification and tortuousity.    I discussed the films with Dr. Martinique.  Will ask surgery to see for an opinion.  If they turn her down for CABG, would plan high risk PCI from LM into the proximal LCx on Monday or Tuesday.  Will need to keep TTVP in place.    Procedural Details  Technical Details Procedure: Left Heart Cath, Selective Coronary Angiography, LV angiography  Indication: Cardiomyopathy, complete heart block   Procedural Details: The left wrist was prepped, draped, and anesthetized with 1% lidocaine. Using the modified Seldinger technique and ultrasound guidance, a 6 French Slender sheath was introduced into the left radial artery. 3 mg of verapamil was administered through the sheath, weight-based unfractionated heparin was administered intravenously. Standard Judkins catheters were used for selective coronary angiography. Catheter exchanges were performed over an exchange length guidewire. There were no immediate procedural complications. A TR band was used for radial hemostasis at the completion of the procedure.  The patient was transferred to the post catheterization recovery area for further monitoring.   Estimated blood loss <50 mL.   During this procedure  medications were administered to achieve and maintain moderate conscious sedation while the patient's heart rate, blood pressure, and oxygen saturation were continuously monitored and I was present face-to-face 100% of this time.   Coronary Findings   Diagnostic Dominance: Co-dominant  Left Main  Dist LM lesion is 70% stenosed. The lesion is calcified.  Left Circumflex  Ost Cx lesion is 70% stenosed.  Prox Cx lesion is 99% stenosed. The lesion is severely calcified.   Intervention   No interventions have been documented.  Coronary Diagrams   Diagnostic Dominance: Co-dominant    Interventio       ECHOCARDIOGRAM REPORT       Patient Name:  Brittanni MOORE [place] Date of Exam: 04/21/2020  Medical Rec #: [id number]      Height:    67.0 in  Accession #:  [id number]      Weight:    125.7 lb  Date of Birth: 02-12-1950      BSA:     1.660 m  Patient Age:  13 years       BP:      127/71 mmHg  Patient Gender: F          HR:      80 bpm.  Exam Location: Inpatient   Procedure: 2D Echo, Cardiac Doppler and Color Doppler   Indications:  I50.23 Acute on chronic systolic (congestive) heart  failure    History:    Patient has prior history of Echocardiogram examinations,  most  recent 09/19/2018. CHF, Stroke; Risk Factors:Hypertension  and         Dyslipidemia. Seizures. Sickle Cell anemia.    Sonographer:  Jonelle Sidle Dance  Referring Phys: [place]    1. Left ventricular ejection fraction, by estimation, is 35 to 40%. The  left ventricle has moderately decreased function. The left ventricle  demonstrates regional wall motion abnormalities (see scoring  diagram/findings for description). Left ventricular  diastolic parameters are consistent with Grade I diastolic dysfunction  (impaired relaxation). Elevated left ventricular end-diastolic pressure.   There is severe hypokinesis of the left ventricular, entire inferior wall  and lateral wall.  2. Right ventricular systolic function is normal. The right ventricular  size is normal. There is normal pulmonary artery systolic pressure.  3. Left atrial size was mildly dilated.  4. Right atrial size was mildly dilated.  5. The mitral valve is grossly normal. Trivial mitral valve  regurgitation.  6. The aortic valve is tricuspid. There is mild calcification of the  aortic valve. There is mild thickening of the aortic valve. Aortic valve  regurgitation is moderate. Mild to moderate aortic valve  sclerosis/calcification is present, without any  evidence of aortic stenosis.  7. The inferior vena cava is normal in size with greater than 50%  respiratory variability, suggesting right atrial pressure of 3 mmHg.   Comparison(s): No significant change from prior study.   FINDINGS  Left Ventricle: Left ventricular ejection fraction, by estimation, is 35  to 40%. The left ventricle has moderately decreased function. The left  ventricle demonstrates regional wall motion abnormalities. Severe  hypokinesis of the left ventricular, entire  inferior wall and lateral wall. The left ventricular internal cavity size  was normal in size. There is no left ventricular hypertrophy. Abnormal  (paradoxical) septal motion, consistent with RV pacemaker. Left  ventricular diastolic parameters are  consistent with Grade I diastolic dysfunction (impaired relaxation).  Elevated left ventricular end-diastolic pressure.   Right Ventricle: The right ventricular size is normal. No increase in  right ventricular wall thickness. Right ventricular systolic function is  normal. There is normal pulmonary artery systolic pressure. The tricuspid  regurgitant velocity is 2.13 m/s, and  with an assumed right atrial pressure of 3 mmHg, the estimated right  ventricular systolic pressure is 56.4 mmHg.   Left Atrium:  Left atrial size was mildly dilated.   Right Atrium: Right atrial size was mildly dilated.   Pericardium: There is no evidence of pericardial effusion.   Mitral Valve: The mitral valve is grossly normal. There is mild thickening  of the mitral valve leaflet(s). There is mild calcification of the mitral  valve leaflet(s). Trivial mitral valve regurgitation.   Tricuspid Valve: The tricuspid valve is normal in structure. Tricuspid  valve regurgitation is trivial.   Aortic Valve: The aortic valve is tricuspid. There is mild calcification  of the aortic valve. There is mild thickening of the aortic valve. Aortic  valve regurgitation is moderate. Aortic regurgitation PHT measures 351  msec. Mild to moderate aortic valve  sclerosis/calcification is present, without any evidence of aortic  stenosis.   Pulmonic Valve: The pulmonic valve was not well visualized. Pulmonic valve  regurgitation is not visualized.   Aorta: The aortic root, ascending aorta and aortic arch are all  structurally normal, with no evidence of dilitation or obstruction.   Venous: The inferior vena cava is normal in size with greater than 50%  respiratory variability, suggesting right atrial pressure of 3 mmHg.  IAS/Shunts: The atrial septum is grossly normal.   Additional Comments: A device lead is visualized.     LEFT VENTRICLE  PLAX 2D  LVIDd:     3.50 cm Diastology  LVIDs:     2.50 cm LV e' medial:  4.03 cm/s  LV PW:     1.20 cm LV E/e' medial: 19.0  LV IVS:    1.00 cm LV e' lateral:  3.50 cm/s  LVOT diam:   1.80 cm LV E/e' lateral: 21.9  LV SV:     32  LV SV Index:  19  LVOT Area:   2.54 cm               3D Volume EF:             3D EF:    37 %   RIGHT VENTRICLE      IVC  RV Basal diam: 2.80 cm  IVC diam: 2.00 cm  RV S prime:   9.99 cm/s  TAPSE (M-mode): 1.9 cm   LEFT ATRIUM       Index    RIGHT ATRIUM       Index  LA diam:    2.70 cm 1.63 cm/m RA Area:   13.20 cm  LA Vol (A2C):  46.6 ml 28.08 ml/m RA Volume:  29.90 ml 18.01 ml/m  LA Vol (A4C):  33.7 ml 20.30 ml/m  LA Biplane Vol: 39.7 ml 23.92 ml/m  AORTIC VALVE  LVOT Vmax:  73.80 cm/s  LVOT Vmean: 45.300 cm/s  LVOT VTI:  0.127 m  AI PHT:   351 msec    AORTA  Ao Root diam: 3.10 cm  Ao Asc diam: 3.00 cm   MITRAL VALVE        TRICUSPID VALVE  MV Area (PHT): 3.77 cm   TR Peak grad:  18.1 mmHg  MV Decel Time: 201 msec   TR Vmax:    213.00 cm/s  MV E velocity: 76.70 cm/s  MV A velocity: 108.00 cm/s SHUNTS  MV E/A ratio: 0.71     Systemic VTI: 0.13 m               Systemic Diam: 1.80 cm   Buford Dresser MD  Electronically signed by Buford Dresser MD  Signature Date/Time: 04/21/2020/9:06:37 PM        Recent Radiology Findings:     Result Date: 04/21/2020 Successful temporary transvenous pacemaker placement from right femoral vein.  Left at 80 bpm, 8 mA.  Threshold 3.5 mA.   DG Chest Portable 1 View  Result Date: 04/21/2020 CLINICAL DATA:  70 year old female with weakness, syncope, wide complex bradycardia. EXAM: PORTABLE CHEST 1 VIEW COMPARISON:  Portable chest 09/18/2018 and earlier. FINDINGS: Portable AP semi upright view at 0900 hours. Pacer/resuscitation pads project over the left chest. Larger lung volumes. Mediastinal contours are stable and within normal limits. Visualized tracheal air column is within normal limits. Allowing for portable technique the lungs are clear. No pneumothorax identified, linear external artifact projecting over the outer right lung. No acute osseous abnormality identified. IMPRESSION: No acute cardiopulmonary abnormality. Electronically Signed   By: Genevie Ann M.D.   On: 04/21/2020 09:08      I have independently reviewed the above radiologic studies and discussed with the patient   Recent Lab Findings: Lab Results   Component Value Date   WBC 10.0 04/22/2020   HGB 12.4 04/22/2020   HCT 38.0 04/22/2020   PLT 303 04/22/2020   GLUCOSE 101 (H)  04/22/2020   CHOL 124 04/22/2020   TRIG 94 04/22/2020   HDL 41 04/22/2020   LDLCALC 64 04/22/2020   ALT 15 04/21/2020   AST 28 04/21/2020   NA 138 04/22/2020   K 3.9 04/22/2020   CL 103 04/22/2020   CREATININE 1.08 (H) 04/22/2020   BUN 15 04/22/2020   CO2 26 04/22/2020   TSH 1.846 10/23/2013   INR 2.0 (H) 04/22/2020   HGBA1C 6.1 (H) 04/22/2020      Assessment / Plan:    70 year old female with multiple medical problems including right hemiparesis with right upper extremity contracture resulting from a suspected cardioembolic left middle cerebral artery infarct in 2009.  She subsequently developed a seizure disorder but according to her husband has not been observed to have a seizure since 2019.  Additionally, she has hypertension, dyslipidemia, and chronic combined systolic and diastolic heart failure.  Although there is mention on previous note of history of sickle cell anemia, has been reports she has never been diagnosed with the disease or the trait.  After presenting with new onset heart block, she has had recovery of normal sinus rhythm after holding her digoxin and carvedilol.  Cardiac work-up has included an echocardiogram with the above-noted findings as well as left heart catheterization performed earlier today.  Coronary angiography revealed 70% distal left main coronary artery stenosis along with an ostial 70% circumflex coronary artery stenosis followed by a 99% stenosis in the proximal circumflex coronary artery.  The cardiology service feel her complete heart block is a manifestation of ischemia resulting from this disease.  Distal targets appear well-seated for bypass grafting and she could potentially be completely revascularized with 2 grafts. However, given her multiple comorbidities including aphasia and right hemiplegia, she would have  significant difficulty in returning back to her current state of function after CABG.  Dr. Algernon Huxley and Dr. Martinique have reviewed the coronary angiography and consider high risk angioplasty in lieu of surgery.  This is likely her best option in order to maintain her current level of function which she has apparently worked very diligently to attain.  Dr. Roxan Hockey will review and will discuss further with the consulting team.     I  spent 30 minutes counseling the patient face to face.  Antony Odea, PA-C  04/22/2020 3:31 PM   Patient seen and examined, records and cath films reviewed. Agree with history and physical and assessment above. Presented with syncopal episode, noted to be in CHB. Cath showed left main and proximal circumflex disease. Needs revascularization.  Options include CABG vs PCI. PCI would be technically difficult. CABG technically straight forward and more likely to have good long term result but at higher initial cost in terms of pain, potential complications and recovery period.  I discussed the general nature of the procedure, the need for general anesthesia, the incisions to be used, the use of cardiopulmonary bypass with [name] and Mr. [place]. We discussed the expected hospital stay, overall recovery and short and long term outcomes. I informed them of the indications, risks, benefits and alternatives.  They understand the risks include but are not limited to death, stroke, MI, DVT/PE, bleeding, possible need for transfusion, infections, cardiac arrhythmias, as well as other organ system dysfunction including respiratory, renal, or GI complications.   She is high risk especially in regards to neuro complications and sternal wound issues. She will likely have a prolonged recovery and may need a SNF placement short term.  She has an expressive  aphasia but seems to prefer shorter hospitalization than CABG will require. Her husband seems more in favor of CABG.  They will further discuss and I will see them again tomorrow.  If she decides to proceed with CABG we can do Monday afternoon  Remo Lipps C. Roxan Hockey, MD [facility] [phone]

## 2020-04-22 NOTE — Progress Notes (Signed)
[place] for Warfarin > Heparin Indication: stroke  Allergies  Allergen Reactions  . Lexiscan [Regadenoson] Other (See Comments)    Perioral tingling and throat tightness    Patient Measurements: Height: 5\' 7"  (170.2 cm) Weight: 55.5 kg (122 lb 5.7 oz) IBW/kg (Calculated) : 61.6 Heparin Dosing Weight: 57 kg  Vital Signs: Temp: 98.2 F (36.8 C) (04/01 1129) Temp Source: Oral (04/01 1129) BP: 114/63 (04/01 1344) Pulse Rate: 0 (04/01 1359)  Labs: Recent Labs    04/21/20 0825 04/21/20 0832 04/21/20 1418 04/22/20 0426  HGB 11.1* 11.2*  --  12.4  HCT 37.0 33.0*  --  38.0  PLT 322  --   --  303  LABPROT 23.3*  --   --  21.9*  INR 2.2*  --   --  2.0*  CREATININE 1.25* 1.00  --  1.08*  TROPONINIHS 70*  --  124*  --     Estimated Creatinine Clearance: 41.9 mL/min (A) (by C-G formula based on SCr of 1.08 mg/dL (H)).   Medical History: Past Medical History:  Diagnosis Date  . Adenomatous polyp of colon 09/2012   repeat colonoscopy in 5 years by Dr. Sharlett Iles  . CHF (congestive heart failure) (HCC)    EF 15-20% as of 05/28/11 (Dr [facility] summary)  . CVA (cerebral infarction) 12/2007  . Hemiparesis ([place])   . Hyperlipidemia   . Hypertension   . Seizures ([place])   . Sickle cell anemia (HCC)   . Stroke ([place])   . Vitamin D deficiency 2010    Assessment: 70 yo female presents with CHB now s/p temporary pacer. On warfarin PTA for hx embolic stroke, no known hx of afib. INR on admit therapeutic at 2.2. Pharmacy asked to stop warfarin and to start heparin when INR <2.   PTA warfarin regimen: 5 mg daily except 7.5 mg Mon/Thurs INR on admit: 2.2  AM heparin level = 2.  No overt bleeding or complications noted. Pt now s/p cath lab, and pharmacy asked to begin anticoagulation with IV heparin 8 hrs after sheath out (removed ~ 1345 pm).  Venous sheath remains in place for pacer.  Goal of Therapy: Heparin level 0.3-0.7   INR 2-3 Monitor platelets by anticoagulation protocol: Yes   Plan:  Will recheck INR now to confirm not > 2. Then start IV heparin tonight at 10 pm at rate of 750 units/hr. Check heparin level 8 hrs after gtt starts. Daily heparin level and CBC.   [place] Crane, Roylene Reason, BCCP Clinical Pharmacist  04/22/2020 2:27 PM   [facility] pharmacy phone numbers are listed on [email or web address]

## 2020-04-22 NOTE — Interval H&P Note (Signed)
History and Physical Interval Note:  04/22/2020 1:02 PM  [name]  has presented today for surgery, with the diagnosis of chest pain.  The various methods of treatment have been discussed with the patient and family. After consideration of risks, benefits and other options for treatment, the patient has consented to  Procedure(s): LEFT HEART CATH AND CORONARY ANGIOGRAPHY (N/A) as a surgical intervention.  The patient's history has been reviewed, patient examined, no change in status, stable for surgery.  I have reviewed the patient's chart and labs.  Questions were answered to the patient's satisfaction.     Alie Hardgrove [employer]

## 2020-04-22 NOTE — Progress Notes (Signed)
[place] for Warfarin > Heparin Indication: stroke  Allergies  Allergen Reactions  . Lexiscan [Regadenoson] Other (See Comments)    Perioral tingling and throat tightness    Patient Measurements: Height: 5\' 7"  (170.2 cm) Weight: 55.5 kg (122 lb 5.7 oz) IBW/kg (Calculated) : 61.6 Heparin Dosing Weight: 57 kg  Vital Signs: Temp: 98.1 F (36.7 C) (04/01 1530) Temp Source: Oral (04/01 1530) BP: 89/54 (04/01 1600) Pulse Rate: 62 (04/01 1600)  Labs: Recent Labs    04/21/20 0825 04/21/20 0832 04/21/20 1418 04/22/20 0426 04/22/20 1522  HGB 11.1* 11.2*  --  12.4  --   HCT 37.0 33.0*  --  38.0  --   PLT 322  --   --  303  --   LABPROT 23.3*  --   --  21.9* 23.0*  INR 2.2*  --   --  2.0* 2.1*  CREATININE 1.25* 1.00  --  1.08*  --   TROPONINIHS 70*  --  124*  --   --     Estimated Creatinine Clearance: 41.9 mL/min (A) (by C-G formula based on SCr of 1.08 mg/dL (H)).   Medical History: Past Medical History:  Diagnosis Date  . Adenomatous polyp of colon 09/2012   repeat colonoscopy in 5 years by Dr. Sharlett Iles  . CHF (congestive heart failure) (HCC)    EF 15-20% as of 05/28/11 (Dr Le[facility] D/C summary)  . CVA (cerebral infarction) 12/2007  . Hemiparesis ([place])   . Hyperlipidemia   . Hypertension   . Seizures ([place])   . Sickle cell anemia (HCC)   . Stroke ([place])   . Vitamin D deficiency 2010    Assessment: 70 yo female presents with CHB now s/p temporary pacer. On warfarin PTA for hx embolic stroke, no known hx of afib. INR on admit therapeutic at 2.2. LHC on 4/1 shows LM-LCx disease, PCI held for CABG consult. Pharmacy asked start heparin 8 hr after sheath pull and when INR <2.   PTA warfarin regimen: 5 mg daily except 7.5 mg Mon/Thurs INR on admit: 2.2  Sheath removed ~ 1345 pm. INR at 15:22 still 2.1. Will repeat INR in morning and delay heparin start until INR < 2.  Goal of Therapy: Heparin level 0.3-0.7  INR  2-3 Monitor platelets by anticoagulation protocol: Yes   Plan:  F/u 5am INR, start IV heparin at 750 units/hr when INR < 2  Check heparin level 8 hrs after gtt starts. Daily heparin level and CBC.  Benetta Spar, PharmD, BCPS, BCCP Clinical Pharmacist  Please check AMION for all [facility] phone numbers After 10:00 PM, call [place] [phone]

## 2020-04-22 NOTE — H&P (View-Only) (Signed)
Patient ID: [name], female   DOB: [birth year], 70 y.o.   MRN: [id number]     Advanced Heart Failure Rounding Note  PCP-Cardiologist: Skeet Latch, MD   Subjective:    Pacer rate turned down to 50 bpm this morning, patient is conducting normally currently now with rate in 70s.    Echo with EF 35-40%, inferior and lateral HK, normal RV.   Objective:   Weight Range: 55.5 kg Body mass index is 19.16 kg/m.   Vital Signs:   Temp:  [98.3 F (36.8 C)-99 F (37.2 C)] 98.3 F (36.8 C) (04/01 0300) Pulse Rate:  [33-83] 79 (04/01 0600) Resp:  [11-50] 15 (04/01 0600) BP: (72-169)/(32-154) 104/59 (04/01 0600) SpO2:  [68 %-100 %] 97 % (04/01 0600) Weight:  [55.5 kg-57 kg] 55.5 kg (04/01 0400) Last BM Date: 04/19/20  Weight change: Filed Weights   04/21/20 0947 04/22/20 0400  Weight: 57 kg 55.5 kg    Intake/Output:   Intake/Output Summary (Last 24 hours) at 04/22/2020 0728 Last data filed at 04/22/2020 0600 Gross per 24 hour  Intake 468.56 ml  Output 750 ml  Net -281.44 ml      Physical Exam    General:  Well appearing. No resp difficulty HEENT: Normal Neck: Supple. JVP not elevated. Carotids 2+ bilat; no bruits. No lymphadenopathy or thyromegaly appreciated. Cor: PMI nondisplaced. Regular rate & rhythm. No rubs, gallops or murmurs. Lungs: Clear Abdomen: Soft, nontender, nondistended. No hepatosplenomegaly. No bruits or masses. Good bowel sounds. Extremities: No cyanosis, clubbing, rash, edema Neuro: Alert & orientedx3, cranial nerves grossly intact. moves all 4 extremities w/o difficulty. Affect pleasant   Telemetry   NSR in 70s (personally reviewed)  Labs    CBC Recent Labs    04/21/20 0825 04/21/20 0832 04/22/20 0426  WBC 9.2  --  10.0  NEUTROABS 1.7  --   --   HGB 11.1* 11.2* 12.4  HCT 37.0 33.0* 38.0  MCV 95.9  --  86.4  PLT 322  --  782   Basic Metabolic Panel Recent Labs    04/21/20 0825 04/21/20 0832 04/22/20 0426  NA 138 138  138  K 3.8 3.6 3.9  CL 106 107 103  CO2 16*  --  26  GLUCOSE 244* 228* 101*  BUN 16 16 15   CREATININE 1.25* 1.00 1.08*  CALCIUM 8.4*  --  8.9   Liver Function Tests Recent Labs    04/21/20 0825  AST 28  ALT 15  ALKPHOS 95  BILITOT 0.4  PROT 6.5  ALBUMIN 3.3*   No results for input(s): LIPASE, AMYLASE in the last 72 hours. Cardiac Enzymes No results for input(s): CKTOTAL, CKMB, CKMBINDEX, TROPONINI in the last 72 hours.  BNP: BNP (last 3 results) No results for input(s): BNP in the last 8760 hours.  ProBNP (last 3 results) No results for input(s): PROBNP in the last 8760 hours.   D-Dimer No results for input(s): DDIMER in the last 72 hours. Hemoglobin A1C Recent Labs    04/22/20 0426  HGBA1C 6.1*   Fasting Lipid Panel Recent Labs    04/22/20 0426  CHOL 124  HDL 41  LDLCALC 64  TRIG 94  CHOLHDL 3.0   Thyroid Function Tests No results for input(s): TSH, T4TOTAL, T3FREE, THYROIDAB in the last 72 hours.  Invalid input(s): FREET3  Other results:   Imaging    CARDIAC CATHETERIZATION  Result Date: 04/21/2020 Successful temporary transvenous pacemaker placement from right femoral vein.  Left at 80 bpm,  8 mA.  Threshold 3.5 mA.   DG Chest Portable 1 View  Result Date: 04/21/2020 CLINICAL DATA:  70 year old female with weakness, syncope, wide complex bradycardia. EXAM: PORTABLE CHEST 1 VIEW COMPARISON:  Portable chest 09/18/2018 and earlier. FINDINGS: Portable AP semi upright view at 0900 hours. Pacer/resuscitation pads project over the left chest. Larger lung volumes. Mediastinal contours are stable and within normal limits. Visualized tracheal air column is within normal limits. Allowing for portable technique the lungs are clear. No pneumothorax identified, linear external artifact projecting over the outer right lung. No acute osseous abnormality identified. IMPRESSION: No acute cardiopulmonary abnormality. Electronically Signed   By: Genevie Ann M.D.   On:  04/21/2020 09:08   ECHOCARDIOGRAM COMPLETE  Result Date: 04/21/2020    ECHOCARDIOGRAM REPORT   Patient Name:   [name] Date of Exam: 04/21/2020 Medical Rec #:  [id number]            Height:       67.0 in Accession #:    [id number]           Weight:       125.7 lb Date of Birth:  Nov 09, 1950           BSA:          1.660 m Patient Age:    78 years             BP:           127/71 mmHg Patient Gender: F                    HR:           80 bpm. Exam Location:  Inpatient Procedure: 2D Echo, Cardiac Doppler and Color Doppler Indications:    I50.23 Acute on chronic systolic (congestive) heart failure  History:        Patient has prior history of Echocardiogram examinations, most                 recent 09/19/2018. CHF, Stroke; Risk Factors:Hypertension and                 Dyslipidemia. Seizures. Sickle Cell anemia.  Sonographer:    Jonelle Sidle Dance Referring Phys: [place]  1. Left ventricular ejection fraction, by estimation, is 35 to 40%. The left ventricle has moderately decreased function. The left ventricle demonstrates regional wall motion abnormalities (see scoring diagram/findings for description). Left ventricular  diastolic parameters are consistent with Grade I diastolic dysfunction (impaired relaxation). Elevated left ventricular end-diastolic pressure. There is severe hypokinesis of the left ventricular, entire inferior wall and lateral wall.  2. Right ventricular systolic function is normal. The right ventricular size is normal. There is normal pulmonary artery systolic pressure.  3. Left atrial size was mildly dilated.  4. Right atrial size was mildly dilated.  5. The mitral valve is grossly normal. Trivial mitral valve regurgitation.  6. The aortic valve is tricuspid. There is mild calcification of the aortic valve. There is mild thickening of the aortic valve. Aortic valve regurgitation is moderate. Mild to moderate aortic valve sclerosis/calcification is present, without  any evidence of aortic stenosis.  7. The inferior vena cava is normal in size with greater than 50% respiratory variability, suggesting right atrial pressure of 3 mmHg. Comparison(s): No significant change from prior study. FINDINGS  Left Ventricle: Left ventricular ejection fraction, by estimation, is 35 to 40%. The left ventricle has moderately decreased function. The left ventricle  demonstrates regional wall motion abnormalities. Severe hypokinesis of the left ventricular, entire  inferior wall and lateral wall. The left ventricular internal cavity size was normal in size. There is no left ventricular hypertrophy. Abnormal (paradoxical) septal motion, consistent with RV pacemaker. Left ventricular diastolic parameters are consistent with Grade I diastolic dysfunction (impaired relaxation). Elevated left ventricular end-diastolic pressure. Right Ventricle: The right ventricular size is normal. No increase in right ventricular wall thickness. Right ventricular systolic function is normal. There is normal pulmonary artery systolic pressure. The tricuspid regurgitant velocity is 2.13 m/s, and  with an assumed right atrial pressure of 3 mmHg, the estimated right ventricular systolic pressure is 12.8 mmHg. Left Atrium: Left atrial size was mildly dilated. Right Atrium: Right atrial size was mildly dilated. Pericardium: There is no evidence of pericardial effusion. Mitral Valve: The mitral valve is grossly normal. There is mild thickening of the mitral valve leaflet(s). There is mild calcification of the mitral valve leaflet(s). Trivial mitral valve regurgitation. Tricuspid Valve: The tricuspid valve is normal in structure. Tricuspid valve regurgitation is trivial. Aortic Valve: The aortic valve is tricuspid. There is mild calcification of the aortic valve. There is mild thickening of the aortic valve. Aortic valve regurgitation is moderate. Aortic regurgitation PHT measures 351 msec. Mild to moderate aortic valve  sclerosis/calcification is present, without any evidence of aortic stenosis. Pulmonic Valve: The pulmonic valve was not well visualized. Pulmonic valve regurgitation is not visualized. Aorta: The aortic root, ascending aorta and aortic arch are all structurally normal, with no evidence of dilitation or obstruction. Venous: The inferior vena cava is normal in size with greater than 50% respiratory variability, suggesting right atrial pressure of 3 mmHg. IAS/Shunts: The atrial septum is grossly normal. Additional Comments: A device lead is visualized.  LEFT VENTRICLE PLAX 2D LVIDd:         3.50 cm  Diastology LVIDs:         2.50 cm  LV e' medial:    4.03 cm/s LV PW:         1.20 cm  LV E/e' medial:  19.0 LV IVS:        1.00 cm  LV e' lateral:   3.50 cm/s LVOT diam:     1.80 cm  LV E/e' lateral: 21.9 LV SV:         32 LV SV Index:   19 LVOT Area:     2.54 cm                          3D Volume EF:                         3D EF:        37 % RIGHT VENTRICLE            IVC RV Basal diam:  2.80 cm    IVC diam: 2.00 cm RV S prime:     9.99 cm/s TAPSE (M-mode): 1.9 cm LEFT ATRIUM             Index       RIGHT ATRIUM           Index LA diam:        2.70 cm 1.63 cm/m  RA Area:     13.20 cm LA Vol (A2C):   46.6 ml 28.08 ml/m RA Volume:   29.90 ml  18.01 ml/m LA Vol (A4C):   33.7 ml  20.30 ml/m LA Biplane Vol: 39.7 ml 23.92 ml/m  AORTIC VALVE LVOT Vmax:   73.80 cm/s LVOT Vmean:  45.300 cm/s LVOT VTI:    0.127 m AI PHT:      351 msec  AORTA Ao Root diam: 3.10 cm Ao Asc diam:  3.00 cm MITRAL VALVE                TRICUSPID VALVE MV Area (PHT): 3.77 cm     TR Peak grad:   18.1 mmHg MV Decel Time: 201 msec     TR Vmax:        213.00 cm/s MV E velocity: 76.70 cm/s MV A velocity: 108.00 cm/s  SHUNTS MV E/A ratio:  0.71         Systemic VTI:  0.13 m                             Systemic Diam: 1.80 cm Buford Dresser MD Electronically signed by Buford Dresser MD Signature Date/Time: 04/21/2020/9:06:37 PM    Final        Medications:     Scheduled Medications: . atorvastatin  80 mg Oral QHS  . baclofen  20 mg Oral BID  . Chlorhexidine Gluconate Cloth  6 each Topical Daily  . furosemide  40 mg Oral Daily  . lacosamide  100 mg Oral BID  . levETIRAcetam  1,500 mg Oral BID  . sodium chloride flush  3 mL Intravenous Q12H  . spironolactone  12.5 mg Oral Daily     Infusions: . sodium chloride 10 mL/hr at 04/22/20 0600  . sodium chloride 10 mL/hr at 04/22/20 0638     PRN Medications:  sodium chloride, acetaminophen, nitroGLYCERIN, ondansetron (ZOFRAN) IV, polyethylene glycol, sodium chloride flush   Assessment/Plan   1. Complete heart block: Patient had baseline NSR with LAFB/RBBB, so there was underlying conduction system disease that may have worsened spontaneously.  With aphasia, unable to tell me about pre-existing chest pain or dyspnea. HS-TnI 70 => 124.  She is on Coreg and digoxin at home, digoxin level 0.8 at admission.  This morning, she is again conducting normally with NSR in 70s.  - Stopped nodal blockers.  - Keep TTVP in place for now and continue to follow, will turn pacing backup rate down to 50 to allow normal conduction.  EP to evaluate, may eventually need PPM.  If so, with low EF favor CRT.    2. Chronic systolic CHF: Most recent prior echo in 8/20 with EF 35-40% and wall motion abnormalities.  Cardiolite in 8/20 with evidence for old inferior MI.  Suspect ischemic cardiomyopathy but never had cath to confirm.  Current event may have been due to worsening of underlying conduction disease, but think we need to assess coronary disease.  Echo this admission with stable EF 35-40%, inferior and lateral hypokinesis.  Not significantly volume overloaded on exam. Per her husband, at baseline she is aphasic but can walk around the house and do her ADLs.  - Can continue home Lasix and spironolactone.  - Hold digoxin and Coreg with CHB.  - Hold Entresto with soft BP.  - INR down to 2,  think we can do coronary angiography later today given cardiomyopathy of uncertain etiology (likely ischemic) and new CHB.  - If she needs PPM, would recommend CRT.  3. 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  Has been on warfarin.  Has had long-standing aphasia and right  hemiparesis.  Can walk around house and do ADLs.  - Warfarin on hold, cover with heparin gtt when INR < 2 (after cath).  4. H/o seizure disorder: Continue Keppra.  5. ?Sickle cell anemia: Carries this diagnosis.   CRITICAL CARE Performed by: Loralie Champagne  Total critical care time: 35 minutes  Critical care time was exclusive of separately billable procedures and treating other patients.  Critical care was necessary to treat or prevent imminent or life-threatening deterioration.  Critical care was time spent personally by me on the following activities: development of treatment plan with patient and/or surrogate as well as nursing, discussions with consultants, evaluation of patient's response to treatment, examination of patient, obtaining history from patient or surrogate, ordering and performing treatments and interventions, ordering and review of laboratory studies, ordering and review of radiographic studies, pulse oximetry and re-evaluation of patient's condition.   Length of Stay: 1  Loralie Champagne, MD  04/22/2020, 7:28 AM  Advanced Heart Failure Team Pager [phone] (M-F; 7a - 5p)  Please contact [facility] for night-coverage after hours (5p -7a ) and weekends on [email or web address]

## 2020-04-22 NOTE — Progress Notes (Signed)
Patient ID: [name], female   DOB: 12-03-1950, 70 y.o.   MRN: [id number]     Advanced Heart Failure Rounding Note  PCP-Cardiologist: Skeet Latch, MD   Subjective:    Pacer rate turned down to 50 bpm this morning, patient is conducting normally currently now with rate in 70s.    Echo with EF 35-40%, inferior and lateral HK, normal RV.   Objective:   Weight Range: 55.5 kg Body mass index is 19.16 kg/m.   Vital Signs:   Temp:  [98.3 F (36.8 C)-99 F (37.2 C)] 98.3 F (36.8 C) (04/01 0300) Pulse Rate:  [33-83] 79 (04/01 0600) Resp:  [11-50] 15 (04/01 0600) BP: (72-169)/(32-154) 104/59 (04/01 0600) SpO2:  [68 %-100 %] 97 % (04/01 0600) Weight:  [55.5 kg-57 kg] 55.5 kg (04/01 0400) Last BM Date: 04/19/20  Weight change: Filed Weights   04/21/20 0947 04/22/20 0400  Weight: 57 kg 55.5 kg    Intake/Output:   Intake/Output Summary (Last 24 hours) at 04/22/2020 0728 Last data filed at 04/22/2020 0600 Gross per 24 hour  Intake 468.56 ml  Output 750 ml  Net -281.44 ml      Physical Exam    General:  Well appearing. No resp difficulty HEENT: Normal Neck: Supple. JVP not elevated. Carotids 2+ bilat; no bruits. No lymphadenopathy or thyromegaly appreciated. Cor: PMI nondisplaced. Regular rate & rhythm. No rubs, gallops or murmurs. Lungs: Clear Abdomen: Soft, nontender, nondistended. No hepatosplenomegaly. No bruits or masses. Good bowel sounds. Extremities: No cyanosis, clubbing, rash, edema Neuro: Alert & orientedx3, cranial nerves grossly intact. moves all 4 extremities w/o difficulty. Affect pleasant   Telemetry   NSR in 70s (personally reviewed)  Labs    CBC Recent Labs    04/21/20 0825 04/21/20 0832 04/22/20 0426  WBC 9.2  --  10.0  NEUTROABS 1.7  --   --   HGB 11.1* 11.2* 12.4  HCT 37.0 33.0* 38.0  MCV 95.9  --  86.4  PLT 322  --  245   Basic Metabolic Panel Recent Labs    04/21/20 0825 04/21/20 0832 04/22/20 0426  NA 138 138  138  K 3.8 3.6 3.9  CL 106 107 103  CO2 16*  --  26  GLUCOSE 244* 228* 101*  BUN 16 16 15   CREATININE 1.25* 1.00 1.08*  CALCIUM 8.4*  --  8.9   Liver Function Tests Recent Labs    04/21/20 0825  AST 28  ALT 15  ALKPHOS 95  BILITOT 0.4  PROT 6.5  ALBUMIN 3.3*   No results for input(s): LIPASE, AMYLASE in the last 72 hours. Cardiac Enzymes No results for input(s): CKTOTAL, CKMB, CKMBINDEX, TROPONINI in the last 72 hours.  BNP: BNP (last 3 results) No results for input(s): BNP in the last 8760 hours.  ProBNP (last 3 results) No results for input(s): PROBNP in the last 8760 hours.   D-Dimer No results for input(s): DDIMER in the last 72 hours. Hemoglobin A1C Recent Labs    04/22/20 0426  HGBA1C 6.1*   Fasting Lipid Panel Recent Labs    04/22/20 0426  CHOL 124  HDL 41  LDLCALC 64  TRIG 94  CHOLHDL 3.0   Thyroid Function Tests No results for input(s): TSH, T4TOTAL, T3FREE, THYROIDAB in the last 72 hours.  Invalid input(s): FREET3  Other results:   Imaging    CARDIAC CATHETERIZATION  Result Date: 04/21/2020 Successful temporary transvenous pacemaker placement from right femoral vein.  Left at 80 bpm,  8 mA.  Threshold 3.5 mA.   DG Chest Portable 1 View  Result Date: 04/21/2020 CLINICAL DATA:  70 year old female with weakness, syncope, wide complex bradycardia. EXAM: PORTABLE CHEST 1 VIEW COMPARISON:  Portable chest 09/18/2018 and earlier. FINDINGS: Portable AP semi upright view at 0900 hours. Pacer/resuscitation pads project over the left chest. Larger lung volumes. Mediastinal contours are stable and within normal limits. Visualized tracheal air column is within normal limits. Allowing for portable technique the lungs are clear. No pneumothorax identified, linear external artifact projecting over the outer right lung. No acute osseous abnormality identified. IMPRESSION: No acute cardiopulmonary abnormality. Electronically Signed   By: Genevie Ann M.D.   On:  04/21/2020 09:08   ECHOCARDIOGRAM COMPLETE  Result Date: 04/21/2020    ECHOCARDIOGRAM REPORT   Patient Name:   [name] Date of Exam: 04/21/2020 Medical Rec #:  [id number]            Height:       67.0 in Accession #:    [id number]           Weight:       125.7 lb Date of Birth:  Jun 21, 1950           BSA:          1.660 m Patient Age:    45 years             BP:           127/71 mmHg Patient Gender: F                    HR:           80 bpm. Exam Location:  Inpatient Procedure: 2D Echo, Cardiac Doppler and Color Doppler Indications:    I50.23 Acute on chronic systolic (congestive) heart failure  History:        Patient has prior history of Echocardiogram examinations, most                 recent 09/19/2018. CHF, Stroke; Risk Factors:Hypertension and                 Dyslipidemia. Seizures. Sickle Cell anemia.  Sonographer:    Jonelle Sidle Dance Referring Phys: [place]  1. Left ventricular ejection fraction, by estimation, is 35 to 40%. The left ventricle has moderately decreased function. The left ventricle demonstrates regional wall motion abnormalities (see scoring diagram/findings for description). Left ventricular  diastolic parameters are consistent with Grade I diastolic dysfunction (impaired relaxation). Elevated left ventricular end-diastolic pressure. There is severe hypokinesis of the left ventricular, entire inferior wall and lateral wall.  2. Right ventricular systolic function is normal. The right ventricular size is normal. There is normal pulmonary artery systolic pressure.  3. Left atrial size was mildly dilated.  4. Right atrial size was mildly dilated.  5. The mitral valve is grossly normal. Trivial mitral valve regurgitation.  6. The aortic valve is tricuspid. There is mild calcification of the aortic valve. There is mild thickening of the aortic valve. Aortic valve regurgitation is moderate. Mild to moderate aortic valve sclerosis/calcification is present, without  any evidence of aortic stenosis.  7. The inferior vena cava is normal in size with greater than 50% respiratory variability, suggesting right atrial pressure of 3 mmHg. Comparison(s): No significant change from prior study. FINDINGS  Left Ventricle: Left ventricular ejection fraction, by estimation, is 35 to 40%. The left ventricle has moderately decreased function. The left ventricle  demonstrates regional wall motion abnormalities. Severe hypokinesis of the left ventricular, entire  inferior wall and lateral wall. The left ventricular internal cavity size was normal in size. There is no left ventricular hypertrophy. Abnormal (paradoxical) septal motion, consistent with RV pacemaker. Left ventricular diastolic parameters are consistent with Grade I diastolic dysfunction (impaired relaxation). Elevated left ventricular end-diastolic pressure. Right Ventricle: The right ventricular size is normal. No increase in right ventricular wall thickness. Right ventricular systolic function is normal. There is normal pulmonary artery systolic pressure. The tricuspid regurgitant velocity is 2.13 m/s, and  with an assumed right atrial pressure of 3 mmHg, the estimated right ventricular systolic pressure is 41.9 mmHg. Left Atrium: Left atrial size was mildly dilated. Right Atrium: Right atrial size was mildly dilated. Pericardium: There is no evidence of pericardial effusion. Mitral Valve: The mitral valve is grossly normal. There is mild thickening of the mitral valve leaflet(s). There is mild calcification of the mitral valve leaflet(s). Trivial mitral valve regurgitation. Tricuspid Valve: The tricuspid valve is normal in structure. Tricuspid valve regurgitation is trivial. Aortic Valve: The aortic valve is tricuspid. There is mild calcification of the aortic valve. There is mild thickening of the aortic valve. Aortic valve regurgitation is moderate. Aortic regurgitation PHT measures 351 msec. Mild to moderate aortic valve  sclerosis/calcification is present, without any evidence of aortic stenosis. Pulmonic Valve: The pulmonic valve was not well visualized. Pulmonic valve regurgitation is not visualized. Aorta: The aortic root, ascending aorta and aortic arch are all structurally normal, with no evidence of dilitation or obstruction. Venous: The inferior vena cava is normal in size with greater than 50% respiratory variability, suggesting right atrial pressure of 3 mmHg. IAS/Shunts: The atrial septum is grossly normal. Additional Comments: A device lead is visualized.  LEFT VENTRICLE PLAX 2D LVIDd:         3.50 cm  Diastology LVIDs:         2.50 cm  LV e' medial:    4.03 cm/s LV PW:         1.20 cm  LV E/e' medial:  19.0 LV IVS:        1.00 cm  LV e' lateral:   3.50 cm/s LVOT diam:     1.80 cm  LV E/e' lateral: 21.9 LV SV:         32 LV SV Index:   19 LVOT Area:     2.54 cm                          3D Volume EF:                         3D EF:        37 % RIGHT VENTRICLE            IVC RV Basal diam:  2.80 cm    IVC diam: 2.00 cm RV S prime:     9.99 cm/s TAPSE (M-mode): 1.9 cm LEFT ATRIUM             Index       RIGHT ATRIUM           Index LA diam:        2.70 cm 1.63 cm/m  RA Area:     13.20 cm LA Vol (A2C):   46.6 ml 28.08 ml/m RA Volume:   29.90 ml  18.01 ml/m LA Vol (A4C):   33.7 ml  20.30 ml/m LA Biplane Vol: 39.7 ml 23.92 ml/m  AORTIC VALVE LVOT Vmax:   73.80 cm/s LVOT Vmean:  45.300 cm/s LVOT VTI:    0.127 m AI PHT:      351 msec  AORTA Ao Root diam: 3.10 cm Ao Asc diam:  3.00 cm MITRAL VALVE                TRICUSPID VALVE MV Area (PHT): 3.77 cm     TR Peak grad:   18.1 mmHg MV Decel Time: 201 msec     TR Vmax:        213.00 cm/s MV E velocity: 76.70 cm/s MV A velocity: 108.00 cm/s  SHUNTS MV E/A ratio:  0.71         Systemic VTI:  0.13 m                             Systemic Diam: 1.80 cm Buford Dresser MD Electronically signed by Buford Dresser MD Signature Date/Time: 04/21/2020/9:06:37 PM    Final        Medications:     Scheduled Medications: . atorvastatin  80 mg Oral QHS  . baclofen  20 mg Oral BID  . Chlorhexidine Gluconate Cloth  6 each Topical Daily  . furosemide  40 mg Oral Daily  . lacosamide  100 mg Oral BID  . levETIRAcetam  1,500 mg Oral BID  . sodium chloride flush  3 mL Intravenous Q12H  . spironolactone  12.5 mg Oral Daily     Infusions: . sodium chloride 10 mL/hr at 04/22/20 0600  . sodium chloride 10 mL/hr at 04/22/20 0638     PRN Medications:  sodium chloride, acetaminophen, nitroGLYCERIN, ondansetron (ZOFRAN) IV, polyethylene glycol, sodium chloride flush   Assessment/Plan   1. Complete heart block: Patient had baseline NSR with LAFB/RBBB, so there was underlying conduction system disease that may have worsened spontaneously.  With aphasia, unable to tell me about pre-existing chest pain or dyspnea. HS-TnI 70 => 124.  She is on Coreg and digoxin at home, digoxin level 0.8 at admission.  This morning, she is again conducting normally with NSR in 70s.  - Stopped nodal blockers.  - Keep TTVP in place for now and continue to follow, will turn pacing backup rate down to 50 to allow normal conduction.  EP to evaluate, may eventually need PPM.  If so, with low EF favor CRT.    2. Chronic systolic CHF: Most recent prior echo in 8/20 with EF 35-40% and wall motion abnormalities.  Cardiolite in 8/20 with evidence for old inferior MI.  Suspect ischemic cardiomyopathy but never had cath to confirm.  Current event may have been due to worsening of underlying conduction disease, but think we need to assess coronary disease.  Echo this admission with stable EF 35-40%, inferior and lateral hypokinesis.  Not significantly volume overloaded on exam. Per her husband, at baseline she is aphasic but can walk around the house and do her ADLs.  - Can continue home Lasix and spironolactone.  - Hold digoxin and Coreg with CHB.  - Hold Entresto with soft BP.  - INR down to 2,  think we can do coronary angiography later today given cardiomyopathy of uncertain etiology (likely ischemic) and new CHB.  - If she needs PPM, would recommend CRT.  3. 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  Has been on warfarin.  Has had long-standing aphasia and right  hemiparesis.  Can walk around house and do ADLs.  - Warfarin on hold, cover with heparin gtt when INR < 2 (after cath).  4. H/o seizure disorder: Continue Keppra.  5. ?Sickle cell anemia: Carries this diagnosis.   CRITICAL CARE Performed by: Loralie Champagne  Total critical care time: 35 minutes  Critical care time was exclusive of separately billable procedures and treating other patients.  Critical care was necessary to treat or prevent imminent or life-threatening deterioration.  Critical care was time spent personally by me on the following activities: development of treatment plan with patient and/or surrogate as well as nursing, discussions with consultants, evaluation of patient's response to treatment, examination of patient, obtaining history from patient or surrogate, ordering and performing treatments and interventions, ordering and review of laboratory studies, ordering and review of radiographic studies, pulse oximetry and re-evaluation of patient's condition.   Length of Stay: 1  Loralie Champagne, MD  04/22/2020, 7:28 AM  Advanced Heart Failure Team Pager [phone] (M-F; 7a - 5p)  Please contact [facility] for night-coverage after hours (5p -7a ) and weekends on [email or web address]

## 2020-04-22 NOTE — Progress Notes (Signed)
TCTS consulted for CABG evaluation. °

## 2020-04-23 DIAGNOSIS — I442 Atrioventricular block, complete: Secondary | ICD-10-CM

## 2020-04-23 DIAGNOSIS — I5022 Chronic systolic (congestive) heart failure: Secondary | ICD-10-CM | POA: Diagnosis not present

## 2020-04-23 LAB — BASIC METABOLIC PANEL
Anion gap: 8 (ref 5–15)
BUN: 15 mg/dL (ref 8–23)
CO2: 26 mmol/L (ref 22–32)
Calcium: 8.9 mg/dL (ref 8.9–10.3)
Chloride: 104 mmol/L (ref 98–111)
Creatinine, Ser: 1.08 mg/dL — ABNORMAL HIGH (ref 0.44–1.00)
GFR, Estimated: 55 mL/min — ABNORMAL LOW (ref >60)
Glucose, Bld: 95 mg/dL (ref 70–99)
Potassium: 4.1 mmol/L (ref 3.5–5.1)
Sodium: 138 mmol/L (ref 135–145)

## 2020-04-23 LAB — CBC
HCT: 40.2 % (ref 36.0–46.0)
Hemoglobin: 13 g/dL (ref 12.0–15.0)
MCH: 28.3 pg (ref 26.0–34.0)
MCHC: 32.3 g/dL (ref 30.0–36.0)
MCV: 87.4 fL (ref 80.0–100.0)
Platelets: 257 10*3/uL (ref 150–400)
RBC: 4.6 MIL/uL (ref 3.87–5.11)
RDW: 14 % (ref 11.5–15.5)
WBC: 8.2 10*3/uL (ref 4.0–10.5)
nRBC: 0 % (ref 0.0–0.2)

## 2020-04-23 LAB — PROTIME-INR
INR: 2.1 — ABNORMAL HIGH (ref 0.8–1.2)
INR: 2.2 — ABNORMAL HIGH (ref 0.8–1.2)
Prothrombin Time: 22.7 seconds — ABNORMAL HIGH (ref 11.4–15.2)
Prothrombin Time: 23.8 seconds — ABNORMAL HIGH (ref 11.4–15.2)

## 2020-04-23 MED ORDER — DAPAGLIFLOZIN PROPANEDIOL 10 MG PO TABS
10.0000 mg | ORAL_TABLET | Freq: Every day | ORAL | Status: DC
  Administered 2020-04-23 – 2020-04-24 (×2): 10 mg via ORAL
  Filled 2020-04-23 (×3): qty 1

## 2020-04-23 MED ORDER — ASPIRIN 81 MG PO CHEW
81.0000 mg | CHEWABLE_TABLET | Freq: Every day | ORAL | Status: DC
  Administered 2020-04-23 – 2020-04-25 (×3): 81 mg via ORAL
  Filled 2020-04-23 (×3): qty 1

## 2020-04-23 MED ORDER — TRAMADOL HCL 50 MG PO TABS
50.0000 mg | ORAL_TABLET | Freq: Four times a day (QID) | ORAL | Status: DC | PRN
  Administered 2020-04-23 – 2020-04-24 (×5): 50 mg via ORAL
  Filled 2020-04-23 (×5): qty 1

## 2020-04-23 NOTE — Progress Notes (Signed)
[facility] for Warfarin > Heparin Indication: stroke  Allergies  Allergen Reactions  . Lexiscan [Regadenoson] Other (See Comments)    Perioral tingling and throat tightness    Patient Measurements: Height: 5\' 7"  (170.2 cm) Weight: 55.2 kg (121 lb 11.1 oz) IBW/kg (Calculated) : 61.6 Heparin Dosing Weight: 57 kg  Vital Signs: Temp: 97.8 F (36.6 C) (04/02 0300) Temp Source: Oral (04/02 0300) BP: 119/69 (04/02 0600) Pulse Rate: 67 (04/02 0600)  Labs: Recent Labs    04/21/20 0825 04/21/20 0832 04/21/20 1418 04/22/20 0426 04/22/20 1522 04/23/20 0010  HGB 11.1* 11.2*  --  12.4  --  13.0  HCT 37.0 33.0*  --  38.0  --  40.2  PLT 322  --   --  303  --  257  LABPROT 23.3*  --   --  21.9* 23.0* 22.7*  INR 2.2*  --   --  2.0* 2.1* 2.1*  CREATININE 1.25* 1.00  --  1.08*  --  1.08*  TROPONINIHS 70*  --  124*  --   --   --     Estimated Creatinine Clearance: 41.6 mL/min (A) (by C-G formula based on SCr of 1.08 mg/dL (H)).   Medical History: Past Medical History:  Diagnosis Date  . Adenomatous polyp of colon 09/2012   repeat colonoscopy in 5 years by Dr. Sharlett Iles  . CHF (congestive heart failure) (HCC)    EF 15-20% as of 05/28/11 (Dr [facility] summary)  . CVA (cerebral infarction) 12/2007  . Hemiparesis ([place])   . Hyperlipidemia   . Hypertension   . Seizures ([place])   . Sickle cell anemia (HCC)   . Stroke ([place])   . Vitamin D deficiency 2010    Assessment: 70 yo female presents with CHB now s/p temporary pacer. On warfarin PTA for hx embolic stroke, no known hx of afib. INR on admit therapeutic at 2.2. LHC on 4/1 shows LM-LCx disease, PCI held for CABG consult. Pharmacy asked start heparin 8 hr after sheath pull and when INR <2.   PTA warfarin regimen: 5 mg daily except 7.5 mg Mon/Thurs INR on admit: 2.2  INR remains >2 at 2.1 today after holding warfarin since 3/31. CBC stable wnl. No overt bleeding noted. Will  recheck INR this afternoon and plan to start heparin if <2.  Goal of Therapy: Heparin level 0.3-0.7  INR 2-3 Monitor platelets by anticoagulation protocol: Yes   Plan:  Check 1300 PT/INR Start IV heparin at 750 units/hr when INR < 2  Check heparin level 6 hrs after gtt starts. Daily heparin level and CBC.  Richardine Service, PharmD, BCPS PGY2 Cardiology Pharmacy Resident Phone: [phone] 04/23/2020  7:35 AM  Please check [email or web address] for unit-specific pharmacy phone numbers.

## 2020-04-23 NOTE — Progress Notes (Signed)
[facility] for Warfarin > Heparin Indication: stroke  Allergies  Allergen Reactions  . Lexiscan [Regadenoson] Other (See Comments)    Perioral tingling and throat tightness    Patient Measurements: Height: 5\' 7"  (170.2 cm) Weight: 55.2 kg (121 lb 11.1 oz) IBW/kg (Calculated) : 61.6 Heparin Dosing Weight: 57 kg  Vital Signs: Temp: 98.1 F (36.7 C) (04/02 1130) Temp Source: Oral (04/02 1130) BP: 132/70 (04/02 1300) Pulse Rate: 62 (04/02 1300)  Labs: Recent Labs    04/21/20 0825 04/21/20 0832 04/21/20 1418 04/22/20 0426 04/22/20 1522 04/23/20 0010 04/23/20 1306  HGB 11.1* 11.2*  --  12.4  --  13.0  --   HCT 37.0 33.0*  --  38.0  --  40.2  --   PLT 322  --   --  303  --  257  --   LABPROT 23.3*  --   --  21.9* 23.0* 22.7* 23.8*  INR 2.2*  --   --  2.0* 2.1* 2.1* 2.2*  CREATININE 1.25* 1.00  --  1.08*  --  1.08*  --   TROPONINIHS 70*  --  124*  --   --   --   --     Estimated Creatinine Clearance: 41.6 mL/min (A) (by C-G formula based on SCr of 1.08 mg/dL (H)).   Medical History: Past Medical History:  Diagnosis Date  . Adenomatous polyp of colon 09/2012   repeat colonoscopy in 5 years by Dr. Sharlett Iles  . CHF (congestive heart failure) (HCC)    EF 15-20% as of 05/28/11 (Dr [facility] D[facility]C summary)  . CVA (cerebral infarction) 12/2007  . Hemiparesis ([place])   . Hyperlipidemia   . Hypertension   . Seizures ([place])   . Sickle cell anemia (HCC)   . Stroke ([place])   . Vitamin D deficiency 2010    Assessment: 70 yo female presents with CHB now s/p temporary pacer. On warfarin PTA for hx embolic stroke, no known hx of afib. INR on admit therapeutic at 2.2. LHC on 4/1 shows LM-LCx disease, PCI held for CABG consult. Pharmacy asked start heparin 8 hr after sheath pull and when INR <2.   PTA warfarin regimen: 5 mg daily except 7.5 mg Mon/Thurs INR on admit: 2.2  INR remains >2 at 2.1 today after holding warfarin since 3/31.  CBC stable wnl. No overt bleeding noted. Will recheck INR this afternoon and plan to start heparin if <2.  Goal of Therapy: Heparin level 0.3-0.7  INR 2-3 Monitor platelets by anticoagulation protocol: Yes   Plan:  Check 1300 PT/INR Start IV heparin at 750 units/hr when INR < 2  Check heparin level 6 hrs after gtt starts. Daily heparin level and CBC.  ============================= PM Update: INR remains >2 at 2.2 this PM. Will continue holding warfarin and start heparin when INR <2. Check INR in AM.  Richardine Service, PharmD, BCPS PGY2 Cardiology Pharmacy Resident Phone: [phone] 04/23/2020  1:58 PM  Please check [email or web address] for unit-specific pharmacy phone numbers.

## 2020-04-23 NOTE — Progress Notes (Signed)
Patient ID: [name], female   DOB: February 13, 1950, 70 y.o.   MRN: [id number]     Advanced Heart Failure Rounding Note  PCP-Cardiologist: Skeet Latch, MD   Subjective:    NSR in 90s this morning, no pacing.  Denies chest pain or dyspnea.  Frustrated because staff has a hard time understanding her (aphasia).    Echo with EF 35-40%, inferior and lateral HK, normal RV.   Coronary angiography: 60-70% dLM, 60-70% ostial LCx, 99% proximal calcified LCx (large/codominant LCx)  Objective:   Weight Range: 55.2 kg Body mass index is 19.06 kg/m.   Vital Signs:   Temp:  [97.4 F (36.3 C)-98.2 F (36.8 C)] 97.7 F (36.5 C) (04/02 0809) Pulse Rate:  [0-101] 67 (04/02 0600) Resp:  [0-148] 16 (04/02 0600) BP: (89-144)/(51-87) 119/69 (04/02 0600) SpO2:  [0 %-100 %] 100 % (04/02 0600) Weight:  [55.2 kg] 55.2 kg (04/02 0500) Last BM Date: 04/19/20  Weight change: Filed Weights   04/21/20 0947 04/22/20 0400 04/23/20 0500  Weight: 57 kg 55.5 kg 55.2 kg    Intake/Output:   Intake/Output Summary (Last 24 hours) at 04/23/2020 0830 Last data filed at 04/23/2020 0400 Gross per 24 hour  Intake 405.18 ml  Output 1400 ml  Net -994.82 ml      Physical Exam    General: NAD Neck: No JVD, no thyromegaly or thyroid nodule.  Lungs: Clear to auscultation bilaterally with normal respiratory effort. CV: Nondisplaced PMI.  Heart regular S1/S2, no S3/S4, no murmur.  No peripheral edema.  Abdomen: Soft, nontender, no hepatosplenomegaly, no distention.  Skin: Intact without lesions or rashes.  Neurologic: Alert and oriented x 3.  Psych: Normal affect. Extremities: No clubbing or cyanosis.  HEENT: Normal.    Telemetry   NSR in 90s (personally reviewed)  Labs    CBC Recent Labs    04/21/20 0825 04/21/20 0832 04/22/20 0426 04/23/20 0010  WBC 9.2  --  10.0 8.2  NEUTROABS 1.7  --   --   --   HGB 11.1*   < > 12.4 13.0  HCT 37.0   < > 38.0 40.2  MCV 95.9  --  86.4 87.4  PLT  322  --  303 257   < > = values in this interval not displayed.   Basic Metabolic Panel Recent Labs    04/22/20 0426 04/23/20 0010  NA 138 138  K 3.9 4.1  CL 103 104  CO2 26 26  GLUCOSE 101* 95  BUN 15 15  CREATININE 1.08* 1.08*  CALCIUM 8.9 8.9   Liver Function Tests Recent Labs    04/21/20 0825  AST 28  ALT 15  ALKPHOS 95  BILITOT 0.4  PROT 6.5  ALBUMIN 3.3*   No results for input(s): LIPASE, AMYLASE in the last 72 hours. Cardiac Enzymes No results for input(s): CKTOTAL, CKMB, CKMBINDEX, TROPONINI in the last 72 hours.  BNP: BNP (last 3 results) No results for input(s): BNP in the last 8760 hours.  ProBNP (last 3 results) No results for input(s): PROBNP in the last 8760 hours.   D-Dimer No results for input(s): DDIMER in the last 72 hours. Hemoglobin A1C Recent Labs    04/22/20 0426  HGBA1C 6.1*   Fasting Lipid Panel Recent Labs    04/22/20 0426  CHOL 124  HDL 41  LDLCALC 64  TRIG 94  CHOLHDL 3.0   Thyroid Function Tests No results for input(s): TSH, T4TOTAL, T3FREE, THYROIDAB in the last 72 hours.  Invalid input(s): FREET3  Other results:   Imaging    CARDIAC CATHETERIZATION  Result Date: 04/22/2020  Dist LM lesion is 70% stenosed.  Ost Cx lesion is 70% stenosed.  Prox Cx lesion is 99% stenosed.  1. LVEDP 7. 2. 60-70% distal left main stenosis. 3. Heavily calcified proximal LCx.  60-70% stenosis at the ostium with discrete 99% stenosis in the proximal LCx.  I suspect that her CHB is related to chronic ischemia from LM-LCx disease.  Difficult situation.  She has surgical disease but seems to be a poor candidate for CABG with weakness and aphasia from prior stroke though per her husband, she does all ADLs and walks with cane at home.  PCI would be possible but high risk/difficult heavy calcification and tortuousity.  I discussed the films with Dr. Martinique.  Will ask surgery to see for an opinion.  If they turn her down for CABG, would plan high  risk PCI from LM into the proximal LCx on Monday or Tuesday.  Will need to keep TTVP in place.     Medications:     Scheduled Medications: . atorvastatin  80 mg Oral QHS  . baclofen  20 mg Oral BID  . Chlorhexidine Gluconate Cloth  6 each Topical Daily  . dapagliflozin propanediol  10 mg Oral Daily  . furosemide  40 mg Oral Daily  . lacosamide  100 mg Oral BID  . levETIRAcetam  1,500 mg Oral BID  . sodium chloride flush  3 mL Intravenous Q12H  . spironolactone  12.5 mg Oral Daily    Infusions: . sodium chloride      PRN Medications: sodium chloride, acetaminophen, nitroGLYCERIN, ondansetron (ZOFRAN) IV, polyethylene glycol, sodium chloride flush   Assessment/Plan   1. Complete heart block: Patient had baseline NSR with LAFB/RBBB, so there was underlying conduction system disease.  Suspect this progressed to CHB with beta blocker/digoxin use + chronic ischemia in territory of large  co-dominant LCx.  Off nodal blockers, she remains in NSR. - Stopped nodal blockers.  - Keep TTVP in place for now, will need through PCI or CABG.  May be able to avoid long-term PPM with revascularization.  2. Chronic systolic CHF: Most recent prior echo in 8/20 with EF 35-40% and wall motion abnormalities.  Ischemic cardiomyopathy with left main/severe codominant LCx disease.  Echo this admission with stable EF 35-40%, inferior and lateral hypokinesis.  Not significantly volume overloaded on exam. Per her husband, at baseline she is aphasic but can walk around the house and do her ADLs.  - Can continue home Lasix and spironolactone.  - Can add Farxiga.  - Hold digoxin and Coreg with CHB.  - Hold Entresto with soft BP.  3. 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  Has been on warfarin.  Has had long-standing aphasia and right hemiparesis.  Can walk around house and do ADLs.  - Warfarin on hold, cover with heparin gtt when INR < 2.  4. H/o seizure disorder: Continue Keppra.  5. ?Sickle cell anemia:  Carries this diagnosis but not sure truly present.  6. CAD:  LHC showed 60-70% distal left main stenosis, heavily calcified proximal LCx with 60-70% stenosis at the ostium and discrete 99% stenosis in the proximal LCx.  I suspect that her CHB is related to chronic ischemia from LM-LCx disease worsened by nodal blockade.  Difficult situation.  She has surgical disease but will be more difficult to rehab after CABG with weakness from prior stroke though per  her husband, she does all ADLs and walks with cane at home.  PCI would be possible but high risk/difficult heavy calcification and tortuousity.  - Continue ASA 81, statin.  - Dr. Roxan Hockey to see today re: CABG.  If no CABG, would arrange for high risk PCI Monday or Tuesday.    Length of Stay: 2  Loralie Champagne, MD  04/23/2020, 8:30 AM  Advanced Heart Failure Team Pager [phone] (M-F; 7a - 5p)  Please contact [facility] for night-coverage after hours (5p -7a ) and weekends on [email or web address]

## 2020-04-23 NOTE — Progress Notes (Signed)
Patient expressed ongoing pain in right leg that appears to be chronic. PRN Tylenol not effective so PA Meng paged and order given for PO Tramadol.

## 2020-04-24 DIAGNOSIS — I442 Atrioventricular block, complete: Secondary | ICD-10-CM | POA: Diagnosis not present

## 2020-04-24 DIAGNOSIS — R4701 Aphasia: Secondary | ICD-10-CM | POA: Diagnosis not present

## 2020-04-24 DIAGNOSIS — I2510 Atherosclerotic heart disease of native coronary artery without angina pectoris: Secondary | ICD-10-CM | POA: Diagnosis not present

## 2020-04-24 LAB — BASIC METABOLIC PANEL
Anion gap: 9 (ref 5–15)
BUN: 14 mg/dL (ref 8–23)
CO2: 24 mmol/L (ref 22–32)
Calcium: 8.7 mg/dL — ABNORMAL LOW (ref 8.9–10.3)
Chloride: 104 mmol/L (ref 98–111)
Creatinine, Ser: 0.94 mg/dL (ref 0.44–1.00)
GFR, Estimated: >60 mL/min (ref >60)
Glucose, Bld: 87 mg/dL (ref 70–99)
Potassium: 3.7 mmol/L (ref 3.5–5.1)
Sodium: 137 mmol/L (ref 135–145)

## 2020-04-24 LAB — CBC
HCT: 38.3 % (ref 36.0–46.0)
Hemoglobin: 12.3 g/dL (ref 12.0–15.0)
MCH: 28.3 pg (ref 26.0–34.0)
MCHC: 32.1 g/dL (ref 30.0–36.0)
MCV: 88.2 fL (ref 80.0–100.0)
Platelets: 224 10*3/uL (ref 150–400)
RBC: 4.34 MIL/uL (ref 3.87–5.11)
RDW: 13.8 % (ref 11.5–15.5)
WBC: 7.1 10*3/uL (ref 4.0–10.5)
nRBC: 0 % (ref 0.0–0.2)

## 2020-04-24 LAB — HEPARIN LEVEL (UNFRACTIONATED): Heparin Unfractionated: 0.33 IU/mL (ref 0.30–0.70)

## 2020-04-24 LAB — PROTIME-INR
INR: 2 — ABNORMAL HIGH (ref 0.8–1.2)
INR: 1.8 — ABNORMAL HIGH (ref 0.8–1.2)
Prothrombin Time: 22.1 seconds — ABNORMAL HIGH (ref 11.4–15.2)
Prothrombin Time: 20.6 seconds — ABNORMAL HIGH (ref 11.4–15.2)

## 2020-04-24 MED ORDER — TEMAZEPAM 15 MG PO CAPS: 15.0000 mg | ORAL_CAPSULE | Freq: Once | ORAL | Status: DC | PRN

## 2020-04-24 MED ORDER — METOPROLOL TARTRATE 12.5 MG HALF TABLET
12.5000 mg | ORAL_TABLET | Freq: Once | ORAL | Status: AC
  Administered 2020-04-25: 12.5 mg via ORAL
  Filled 2020-04-24: qty 1

## 2020-04-24 MED ORDER — CHLORHEXIDINE GLUCONATE CLOTH 2 % EX PADS
6.0000 | MEDICATED_PAD | Freq: Once | CUTANEOUS | Status: AC
  Administered 2020-04-25: 6 via TOPICAL

## 2020-04-24 MED ORDER — TRANEXAMIC ACID 1000 MG/10ML IV SOLN
1.5000 mg/kg/h | INTRAVENOUS | Status: AC
  Administered 2020-04-25: 1.5 mg/kg/h via INTRAVENOUS
  Filled 2020-04-24: qty 25

## 2020-04-24 MED ORDER — DIAZEPAM 2 MG PO TABS
2.0000 mg | ORAL_TABLET | Freq: Once | ORAL | Status: AC
  Administered 2020-04-25: 2 mg via ORAL
  Filled 2020-04-24: qty 1

## 2020-04-24 MED ORDER — SODIUM CHLORIDE 0.9 % IV SOLN
750.0000 mg | INTRAVENOUS | Status: DC
  Filled 2020-04-24: qty 750

## 2020-04-24 MED ORDER — PHENYLEPHRINE HCL-NACL 20-0.9 MG/250ML-% IV SOLN
30.0000 ug/min | INTRAVENOUS | Status: AC
  Administered 2020-04-25: 40 ug/min via INTRAVENOUS
  Filled 2020-04-24: qty 250

## 2020-04-24 MED ORDER — DEXMEDETOMIDINE HCL IN NACL 400 MCG/100ML IV SOLN
0.1000 ug/kg/h | INTRAVENOUS | Status: AC
  Administered 2020-04-25: .5 ug/kg/h via INTRAVENOUS

## 2020-04-24 MED ORDER — MAGNESIUM SULFATE 50 % IJ SOLN
40.0000 meq | INTRAMUSCULAR | Status: DC
  Filled 2020-04-24: qty 9.85

## 2020-04-24 MED ORDER — INSULIN REGULAR(HUMAN) IN NACL 100-0.9 UT/100ML-% IV SOLN
INTRAVENOUS | Status: AC
  Administered 2020-04-25: .3 [IU]/h via INTRAVENOUS
  Filled 2020-04-24: qty 100

## 2020-04-24 MED ORDER — HEPARIN (PORCINE) 25000 UT/250ML-% IV SOLN
750.0000 [IU]/h | INTRAVENOUS | Status: DC
  Administered 2020-04-24: 750 [IU]/h via INTRAVENOUS
  Filled 2020-04-24: qty 250

## 2020-04-24 MED ORDER — CHLORHEXIDINE GLUCONATE 0.12 % MT SOLN
15.0000 mL | Freq: Once | OROMUCOSAL | Status: AC
  Administered 2020-04-25: 15 mL via OROMUCOSAL

## 2020-04-24 MED ORDER — PLASMA-LYTE 148 IV SOLN
INTRAVENOUS | Status: AC
  Administered 2020-04-25: 500 mL
  Filled 2020-04-24: qty 2.5

## 2020-04-24 MED ORDER — TRANEXAMIC ACID (OHS) BOLUS VIA INFUSION
15.0000 mg/kg | INTRAVENOUS | Status: AC
  Administered 2020-04-25: 828 mg via INTRAVENOUS
  Filled 2020-04-24: qty 828

## 2020-04-24 MED ORDER — VANCOMYCIN HCL 1250 MG/250ML IV SOLN
1250.0000 mg | INTRAVENOUS | Status: AC
  Administered 2020-04-25: 1250 mg via INTRAVENOUS
  Filled 2020-04-24: qty 250

## 2020-04-24 MED ORDER — SODIUM CHLORIDE 0.9 % IV SOLN
1.5000 g | INTRAVENOUS | Status: AC
  Administered 2020-04-25: 1.5 g via INTRAVENOUS
  Filled 2020-04-24: qty 1.5

## 2020-04-24 MED ORDER — EPINEPHRINE HCL 5 MG/250ML IV SOLN IN NS
0.0000 ug/min | INTRAVENOUS | Status: DC
Start: 2020-04-25 — End: 2020-04-25
  Filled 2020-04-24: qty 250

## 2020-04-24 MED ORDER — SODIUM CHLORIDE 0.9 % IV SOLN
INTRAVENOUS | Status: DC
  Filled 2020-04-24: qty 30

## 2020-04-24 MED ORDER — NITROGLYCERIN IN D5W 200-5 MCG/ML-% IV SOLN
2.0000 ug/min | INTRAVENOUS | Status: DC
  Filled 2020-04-24: qty 250

## 2020-04-24 MED ORDER — BISACODYL 5 MG PO TBEC
5.0000 mg | DELAYED_RELEASE_TABLET | Freq: Once | ORAL | Status: AC
  Administered 2020-04-24: 5 mg via ORAL
  Filled 2020-04-24: qty 1

## 2020-04-24 MED ORDER — POTASSIUM CHLORIDE CRYS ER 20 MEQ PO TBCR
40.0000 meq | EXTENDED_RELEASE_TABLET | Freq: Once | ORAL | Status: AC
  Administered 2020-04-24: 40 meq via ORAL
  Filled 2020-04-24: qty 2

## 2020-04-24 MED ORDER — CHLORHEXIDINE GLUCONATE CLOTH 2 % EX PADS
6.0000 | MEDICATED_PAD | Freq: Once | CUTANEOUS | Status: AC
  Administered 2020-04-24: 6 via TOPICAL

## 2020-04-24 MED ORDER — MILRINONE LACTATE IN DEXTROSE 20-5 MG/100ML-% IV SOLN
0.3000 ug/kg/min | INTRAVENOUS | Status: DC
  Filled 2020-04-24: qty 100

## 2020-04-24 MED ORDER — NOREPINEPHRINE 4 MG/250ML-% IV SOLN
0.0000 ug/min | INTRAVENOUS | Status: DC
Start: 2020-04-25 — End: 2020-04-25
  Filled 2020-04-24: qty 250

## 2020-04-24 MED ORDER — VITAMIN K1 10 MG/ML IJ SOLN
5.0000 mg | Freq: Once | INTRAVENOUS | Status: AC
  Administered 2020-04-24: 5 mg via INTRAVENOUS
  Filled 2020-04-24: qty 0.5

## 2020-04-24 MED ORDER — TRANEXAMIC ACID (OHS) PUMP PRIME SOLUTION
2.0000 mg/kg | INTRAVENOUS | Status: DC
  Filled 2020-04-24: qty 1.1

## 2020-04-24 MED ORDER — POTASSIUM CHLORIDE 2 MEQ/ML IV SOLN
80.0000 meq | INTRAVENOUS | Status: DC
  Filled 2020-04-24: qty 40

## 2020-04-24 NOTE — Progress Notes (Addendum)
[place] for Warfarin > Heparin Indication: stroke  Allergies  Allergen Reactions  . Lexiscan [Regadenoson] Other (See Comments)    Perioral tingling and throat tightness    Patient Measurements: Height: 5\' 7"  (170.2 cm) Weight: 55.2 kg (121 lb 11.1 oz) IBW/kg (Calculated) : 61.6 Heparin Dosing Weight: 57 kg  Vital Signs: Temp: 98.3 F (36.8 C) (04/03 0728) Temp Source: Oral (04/03 0728) BP: 122/97 (04/03 1000) Pulse Rate: 74 (04/03 1000)  Labs: Recent Labs     0000 04/21/20 1418 04/22/20 0426 04/22/20 1522 04/23/20 0010 04/23/20 1306 04/24/20 0300  HGB   < >  --  12.4  --  13.0  --  12.3  HCT  --   --  38.0  --  40.2  --  38.3  PLT  --   --  303  --  257  --  224  LABPROT  --   --  21.9*   < > 22.7* 23.8* 22.1*  INR  --   --  2.0*   < > 2.1* 2.2* 2.0*  CREATININE  --   --  1.08*  --  1.08*  --  0.94  TROPONINIHS  --  124*  --   --   --   --   --    < > = values in this interval not displayed.    Estimated Creatinine Clearance: 47.8 mL/min (by C-G formula based on SCr of 0.94 mg/dL).   Medical History: Past Medical History:  Diagnosis Date  . Adenomatous polyp of colon 09/2012   repeat colonoscopy in 5 years by Dr. Sharlett Iles  . CHF (congestive heart failure) (HCC)    EF 15-20% as of 05/28/11 (Dr [facility] summary)  . CVA (cerebral infarction) 12/2007  . Hemiparesis ([place])   . Hyperlipidemia   . Hypertension   . Seizures ([place])   . Sickle cell anemia (HCC)   . Stroke ([place])   . Vitamin D deficiency 2010    Assessment: 70 yo female presents with CHB now s/p temporary pacer. On warfarin PTA for hx embolic stroke, no known hx of afib. INR on admit therapeutic at 2.2. LHC on 4/1 shows LM-LCx disease, PCI held for CABG consult. Pharmacy asked start heparin 8 hr after sheath pull and when INR <2.   PTA warfarin regimen: 5 mg daily except 7.5 mg Mon/Thurs INR on admit: 2.2  INR 2 today after holding  warfarin since 3/31. CBC stable wnl. No overt bleeding noted. Will recheck INR this afternoon and plan to start heparin if <2.  Goal of Therapy: Heparin level 0.3-0.7  INR 2-3 Monitor platelets by anticoagulation protocol: Yes   Plan:  Check 1300 PT/INR Start IV heparin at 750 units/hr when INR < 2  Check heparin level 6 hrs after gtt starts. Daily heparin level and CBC.  Richardine Service, PharmD, [place] PGY2 Cardiology Pharmacy Resident Phone: [phone] 04/24/2020  10:33 AM  ====================================== PM Update: INR now <2 at 1.8. No overt bleeding noted. Plans noted for CABG, possibly tomorrow. - Start IV heparin 750 units/hr - Check 6-hr HL - Daily HL, CBC, s/sx bleeding  Richardine Service, PharmD, BCPS PGY2 Cardiology Pharmacy Resident Phone: [phone] 04/24/2020  2:13 PM  Please check [email or web address] for unit-specific pharmacy phone numbers.

## 2020-04-24 NOTE — Progress Notes (Signed)
2 Days Post-Op Procedure(s) (LRB): LEFT HEART CATH AND CORONARY ANGIOGRAPHY (N/A) Subjective: No CP  Objective: Vital signs in last 24 hours: Temp:  [97.8 F (36.6 C)-98.5 F (36.9 C)] 98.5 F (36.9 C) (04/03 1550) Pulse Rate:  [62-115] 71 (04/03 1800) Cardiac Rhythm: Normal sinus rhythm (04/03 0800) Resp:  [8-22] 20 (04/03 1800) BP: (106-139)/(64-107) 115/71 (04/03 1800) SpO2:  [92 %-100 %] 95 % (04/03 1800)  Hemodynamic parameters for last 24 hours:    Intake/Output from previous day: 04/02 0701 - 04/03 0700 In: 483 [P.O.:480; I.V.:3] Out: 800 [Urine:800] Intake/Output this shift: Total I/O In: 129.6 [P.O.:100; I.V.:29.6] Out: 950 [Urine:950]  General appearance: alert, cooperative and no distress Neurologic: right sided weakness and aphasia Heart: regular rate and rhythm Lungs: clear to auscultation bilaterally  Lab Results: Recent Labs    04/23/20 0010 04/24/20 0300  WBC 8.2 7.1  HGB 13.0 12.3  HCT 40.2 38.3  PLT 257 224   BMET:  Recent Labs    04/23/20 0010 04/24/20 0300  NA 138 137  K 4.1 3.7  CL 104 104  CO2 26 24  GLUCOSE 95 87  BUN 15 14  CREATININE 1.08* 0.94  CALCIUM 8.9 8.7*    PT/INR:  Recent Labs    04/24/20 1309  LABPROT 20.6*  INR 1.8*   ABG    Component Value Date/Time   PHART 7.239 (L) 09/12/2018 2148   HCO3 20.5 09/12/2018 2148   TCO2 16 (L) 04/21/2020 0832   ACIDBASEDEF 7.0 (H) 09/12/2018 2148   O2SAT 99.0 09/12/2018 2148   CBG (last 3)  No results for input(s): GLUCAP in the last 72 hours.  Assessment/Plan: S/P Procedure(s) (LRB): LEFT HEART CATH AND CORONARY ANGIOGRAPHY (N/A) -  [name] and her husband have decided to proceed with CABG Will schedule for tomorrow afternoon INR 1.8- will Vit K this evening   LOS: 3 days    Melrose Nakayama 04/24/2020

## 2020-04-24 NOTE — Progress Notes (Signed)
[place] for Warfarin > Heparin Indication: stroke  Allergies  Allergen Reactions  . Lexiscan [Regadenoson] Other (See Comments)    Perioral tingling and throat tightness    Patient Measurements: Height: 5\' 7"  (170.2 cm) Weight: 55.2 kg (121 lb 11.1 oz) IBW/kg (Calculated) : 61.6 Heparin Dosing Weight: 57 kg  Vital Signs: Temp: 98.2 F (36.8 C) (04/03 1946) Temp Source: Oral (04/03 1946) BP: 120/76 (04/03 2100) Pulse Rate: 67 (04/03 2100)  Labs: Recent Labs    04/22/20 0426 04/22/20 1522 04/23/20 0010 04/23/20 1306 04/24/20 0300 04/24/20 1309 04/24/20 2056  HGB 12.4  --  13.0  --  12.3  --   --   HCT 38.0  --  40.2  --  38.3  --   --   PLT 303  --  257  --  224  --   --   LABPROT 21.9*   < > 22.7* 23.8* 22.1* 20.6*  --   INR 2.0*   < > 2.1* 2.2* 2.0* 1.8*  --   HEPARINUNFRC  --   --   --   --   --   --  0.33  CREATININE 1.08*  --  1.08*  --  0.94  --   --    < > = values in this interval not displayed.    Estimated Creatinine Clearance: 47.8 mL/min (by C-G formula based on SCr of 0.94 mg/dL).   Medical History: Past Medical History:  Diagnosis Date  . Adenomatous polyp of colon 09/2012   repeat colonoscopy in 5 years by Dr. Sharlett Iles  . CHF (congestive heart failure) (HCC)    EF 15-20% as of 05/28/11 (Dr [facility] D[facility] summary)  . CVA (cerebral infarction) 12/2007  . Hemiparesis ([place])   . Hyperlipidemia   . Hypertension   . Seizures ([place])   . Sickle cell anemia (HCC)   . Stroke ([place])   . Vitamin D deficiency 2010    Assessment: 70 yo female presents with CHB now s/p temporary pacer. On warfarin PTA for hx embolic stroke, no known hx of afib. INR on admit therapeutic at 2.2. LHC on 4/1 shows LM-LCx disease, PCI held for CABG consult. Pharmacy asked start heparin 8 hr after sheath pull and when INR <2.   PTA warfarin regimen: 5 mg daily except 7.5 mg Mon/Thurs INR on admit: 2.2  INR dropped to <2  earlier today and heparin initiated. Heparin level at goal (0.3) this evening. No bleeding issues noted. Surgery planned for tomorrow.   Goal of Therapy: Heparin level 0.3-0.7  INR 2-3 Monitor platelets by anticoagulation protocol: Yes   Plan:  Continue IV heparin at 750 units/hr Daily heparin level and CBC.  Erin Hearing PharmD., BCPS Clinical Pharmacist 04/24/2020 9:45 PM

## 2020-04-24 NOTE — H&P (View-Only) (Signed)
2 Days Post-Op Procedure(s) (LRB): LEFT HEART CATH AND CORONARY ANGIOGRAPHY (N/A) Subjective: No CP  Objective: Vital signs in last 24 hours: Temp:  [97.8 F (36.6 C)-98.5 F (36.9 C)] 98.5 F (36.9 C) (04/03 1550) Pulse Rate:  [62-115] 71 (04/03 1800) Cardiac Rhythm: Normal sinus rhythm (04/03 0800) Resp:  [8-22] 20 (04/03 1800) BP: (106-139)/(64-107) 115/71 (04/03 1800) SpO2:  [92 %-100 %] 95 % (04/03 1800)  Hemodynamic parameters for last 24 hours:    Intake/Output from previous day: 04/02 0701 - 04/03 0700 In: 483 [P.O.:480; I.V.:3] Out: 800 [Urine:800] Intake/Output this shift: Total I/O In: 129.6 [P.O.:100; I.V.:29.6] Out: 950 [Urine:950]  General appearance: alert, cooperative and no distress Neurologic: right sided weakness and aphasia Heart: regular rate and rhythm Lungs: clear to auscultation bilaterally  Lab Results: Recent Labs    04/23/20 0010 04/24/20 0300  WBC 8.2 7.1  HGB 13.0 12.3  HCT 40.2 38.3  PLT 257 224   BMET:  Recent Labs    04/23/20 0010 04/24/20 0300  NA 138 137  K 4.1 3.7  CL 104 104  CO2 26 24  GLUCOSE 95 87  BUN 15 14  CREATININE 1.08* 0.94  CALCIUM 8.9 8.7*    PT/INR:  Recent Labs    04/24/20 1309  LABPROT 20.6*  INR 1.8*   ABG    Component Value Date/Time   PHART 7.239 (L) 09/12/2018 2148   HCO3 20.5 09/12/2018 2148   TCO2 16 (L) 04/21/2020 0832   ACIDBASEDEF 7.0 (H) 09/12/2018 2148   O2SAT 99.0 09/12/2018 2148   CBG (last 3)  No results for input(s): GLUCAP in the last 72 hours.  Assessment/Plan: S/P Procedure(s) (LRB): LEFT HEART CATH AND CORONARY ANGIOGRAPHY (N/A) -  [name] and her husband have decided to proceed with CABG Will schedule for tomorrow afternoon INR 1.8- will Vit K this evening   LOS: 3 days    Melrose Nakayama 04/24/2020

## 2020-04-24 NOTE — Progress Notes (Signed)
Patient ID: [name], female   DOB: [birth year], 70 y.o.   MRN: [id number]     Advanced Heart Failure Rounding Note  PCP-Cardiologist: Skeet Latch, MD   Subjective:    Denies CP or SOB.   Has been seen by [facility]. She has d/w her husband and wants to proceed with CABG. We confirmed this with RN calling her husband .  Denies CP or SOB. Not pacing  Echo with EF 35-40%, inferior and lateral HK, normal RV.   Coronary angiography: 60-70% dLM, 60-70% ostial LCx, 99% proximal calcified LCx (large/codominant LCx)  Objective:   Weight Range: 55.2 kg Body mass index is 19.06 kg/m.   Vital Signs:   Temp:  [97.8 F (36.6 C)-98.5 F (36.9 C)] 98.3 F (36.8 C) (04/03 0728) Pulse Rate:  [60-70] 66 (04/03 0900) Resp:  [8-22] 15 (04/03 0900) BP: (106-168)/(64-117) 131/64 (04/03 0900) SpO2:  [94 %-100 %] 98 % (04/03 0900) Last BM Date: 04/19/20  Weight change: Filed Weights   04/21/20 0947 04/22/20 0400 04/23/20 0500  Weight: 57 kg 55.5 kg 55.2 kg    Intake/Output:   Intake/Output Summary (Last 24 hours) at 04/24/2020 1000 Last data filed at 04/23/2020 1749 Gross per 24 hour  Intake 363 ml  Output 800 ml  Net -437 ml      Physical Exam    General:  Sitting up in bed  No resp difficulty HEENT: normal poor dentition Neck: supple. no JVD. Carotids 2+ bilat; no bruits. No lymphadenopathy or thryomegaly appreciated. Cor: PMI nondisplaced. Regular rate & rhythm. No rubs, gallops or murmurs. Lungs: clear Abdomen: soft, nontender, nondistended. No hepatosplenomegaly. No bruits or masses. Good bowel sounds. Extremities: no cyanosis, clubbing, rash, edema Neuro: awake follows commands,. Aphasic. R side weak   Telemetry   NSR in 90s (personally reviewed)  Labs    CBC Recent Labs    04/23/20 0010 04/24/20 0300  WBC 8.2 7.1  HGB 13.0 12.3  HCT 40.2 38.3  MCV 87.4 88.2  PLT 257 009   Basic Metabolic Panel Recent Labs    04/23/20 0010 04/24/20 0300  NA  138 137  K 4.1 3.7  CL 104 104  CO2 26 24  GLUCOSE 95 87  BUN 15 14  CREATININE 1.08* 0.94  CALCIUM 8.9 8.7*   Liver Function Tests No results for input(s): AST, ALT, ALKPHOS, BILITOT, PROT, ALBUMIN in the last 72 hours. No results for input(s): LIPASE, AMYLASE in the last 72 hours. Cardiac Enzymes No results for input(s): CKTOTAL, CKMB, CKMBINDEX, TROPONINI in the last 72 hours.  BNP: BNP (last 3 results) No results for input(s): BNP in the last 8760 hours.  ProBNP (last 3 results) No results for input(s): PROBNP in the last 8760 hours.   D-Dimer No results for input(s): DDIMER in the last 72 hours. Hemoglobin A1C Recent Labs    04/22/20 0426  HGBA1C 6.1*   Fasting Lipid Panel Recent Labs    04/22/20 0426  CHOL 124  HDL 41  LDLCALC 64  TRIG 94  CHOLHDL 3.0   Thyroid Function Tests No results for input(s): TSH, T4TOTAL, T3FREE, THYROIDAB in the last 72 hours.  Invalid input(s): FREET3  Other results:   Imaging    No results found.   Medications:     Scheduled Medications: . aspirin  81 mg Oral Daily  . atorvastatin  80 mg Oral QHS  . baclofen  20 mg Oral BID  . Chlorhexidine Gluconate Cloth  6 each Topical Daily  .  dapagliflozin propanediol  10 mg Oral Daily  . furosemide  40 mg Oral Daily  . lacosamide  100 mg Oral BID  . levETIRAcetam  1,500 mg Oral BID  . sodium chloride flush  3 mL Intravenous Q12H  . spironolactone  12.5 mg Oral Daily    Infusions: . sodium chloride      PRN Medications: sodium chloride, acetaminophen, nitroGLYCERIN, ondansetron (ZOFRAN) IV, polyethylene glycol, sodium chloride flush, traMADol   Assessment/Plan   1. Complete heart block: Patient had baseline NSR with LAFB/RBBB, so there was underlying conduction system disease.  Suspect this progressed to CHB with beta blocker/digoxin use + chronic ischemia in territory of large  co-dominant LCx.  Off nodal blockers, she remains in NSR. - Off nodal blockers.  -  Currently not pacing but will keep TTVP in place for now, will need through PCI or CABG.  May be able to avoid long-term PPM with revascularization.  2. Chronic systolic CHF: Most recent prior echo in 8/20 with EF 35-40% and wall motion abnormalities.  Ischemic cardiomyopathy with left main/severe codominant LCx disease.  Echo this admission with stable EF 35-40%, inferior and lateral hypokinesis.  Not significantly volume overloaded on exam. Per her husband, at baseline she is aphasic but can walk around the house and do her ADLs.  - Continue home Lasix and spironolactone.  - Will hold Farxiga prior to surgery - Hold digoxin and Coreg with CHB.  - Hold Entresto with soft BP.  3. 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  Has been on warfarin.  Has had long-standing aphasia and right hemiparesis.  Can walk around house and do ADLs.  - Warfarin on hold, cover with heparin gtt when INR < 2. INR 2.0 today - No overt bleeding. Hgb 12.3 4. H/o seizure disorder: Continue Keppra.  5. ?Sickle cell anemia: Carries this diagnosis but not sure truly present.  6. CAD:  LHC showed 60-70% distal left main stenosis, heavily calcified proximal LCx with 60-70% stenosis at the ostium and discrete 99% stenosis in the proximal LCx.  I suspect that her CHB is related to chronic ischemia from LM-LCx disease worsened by nodal blockade.  Difficult situation.  She has surgical disease but will be more difficult to rehab after CABG with weakness from prior stroke though per her husband, she does all ADLs and walks with cane at home.  PCI would be possible but high risk/difficult heavy calcification and tortuousity.  - Continue ASA 81, statin.  - Dr. Roxan Hockey has seen and offered CABG. They would like to proceed. CABG tomorrow if INR down. Rechecking INR now.   Length of Stay: 3  Glori Bickers, MD  04/24/2020, 10:00 AM  Advanced Heart Failure Team Pager [phone] (M-F; 7a - 5p)  Please contact [facility] for  night-coverage after hours (5p -7a ) and weekends on [email or web address]

## 2020-04-25 ENCOUNTER — Inpatient Hospital Stay (HOSPITAL_COMMUNITY): Payer: Medicare HMO

## 2020-04-25 ENCOUNTER — Inpatient Hospital Stay (HOSPITAL_COMMUNITY): Payer: Medicare HMO | Admitting: Anesthesiology

## 2020-04-25 ENCOUNTER — Inpatient Hospital Stay (HOSPITAL_COMMUNITY)
Admission: EM | Disposition: A | Discharge status: Skilled Nursing Facility | Payer: Self-pay | Source: Home / Self Care | Attending: Thoracic Surgery (Cardiothoracic Vascular Surgery)

## 2020-04-25 ENCOUNTER — Encounter (HOSPITAL_COMMUNITY): Payer: Self-pay | Admitting: Cardiology

## 2020-04-25 DIAGNOSIS — Z01810 Encounter for preprocedural cardiovascular examination: Secondary | ICD-10-CM

## 2020-04-25 DIAGNOSIS — I2510 Atherosclerotic heart disease of native coronary artery without angina pectoris: Secondary | ICD-10-CM

## 2020-04-25 DIAGNOSIS — I442 Atrioventricular block, complete: Secondary | ICD-10-CM | POA: Diagnosis not present

## 2020-04-25 DIAGNOSIS — I5022 Chronic systolic (congestive) heart failure: Secondary | ICD-10-CM | POA: Diagnosis not present

## 2020-04-25 HISTORY — PX: CORONARY ARTERY BYPASS GRAFT: SHX141

## 2020-04-25 HISTORY — PX: TEE WITHOUT CARDIOVERSION: SHX5443

## 2020-04-25 LAB — POCT I-STAT 7, (LYTES, BLD GAS, ICA,H+H)
Acid-Base Excess: 2 mmol/L (ref 0.0–2.0)
Acid-Base Excess: 1 mmol/L (ref 0.0–2.0)
Acid-Base Excess: 1 mmol/L (ref 0.0–2.0)
Acid-base deficit: 1 mmol/L (ref 0.0–2.0)
Bicarbonate: 26.9 mmol/L (ref 20.0–28.0)
Bicarbonate: 25.4 mmol/L (ref 20.0–28.0)
Bicarbonate: 25.6 mmol/L (ref 20.0–28.0)
Bicarbonate: 23.1 mmol/L (ref 20.0–28.0)
Calcium, Ion: 1.19 mmol/L (ref 1.15–1.40)
Calcium, Ion: 0.75 mmol/L — CL (ref 1.15–1.40)
Calcium, Ion: 0.83 mmol/L — CL (ref 1.15–1.40)
Calcium, Ion: 1.33 mmol/L (ref 1.15–1.40)
HCT: 36 % (ref 36.0–46.0)
HCT: 17 % — ABNORMAL LOW (ref 36.0–46.0)
HCT: 19 % — ABNORMAL LOW (ref 36.0–46.0)
HCT: 33 % — ABNORMAL LOW (ref 36.0–46.0)
Hemoglobin: 12.2 g/dL (ref 12.0–15.0)
Hemoglobin: 5.8 g/dL — CL (ref 12.0–15.0)
Hemoglobin: 6.5 g/dL — CL (ref 12.0–15.0)
Hemoglobin: 11.2 g/dL — ABNORMAL LOW (ref 12.0–15.0)
O2 Saturation: 100 %
O2 Saturation: 100 %
O2 Saturation: 100 %
O2 Saturation: 99 %
Patient temperature: 34.4
Potassium: 3.5 mmol/L (ref 3.5–5.1)
Potassium: 4.4 mmol/L (ref 3.5–5.1)
Potassium: 7 mmol/L (ref 3.5–5.1)
Potassium: 4.4 mmol/L (ref 3.5–5.1)
Sodium: 139 mmol/L (ref 135–145)
Sodium: 138 mmol/L (ref 135–145)
Sodium: 140 mmol/L (ref 135–145)
Sodium: 140 mmol/L (ref 135–145)
TCO2: 28 mmol/L (ref 22–32)
TCO2: 27 mmol/L (ref 22–32)
TCO2: 27 mmol/L (ref 22–32)
TCO2: 24 mmol/L (ref 22–32)
pCO2 arterial: 44.2 mmHg (ref 32.0–48.0)
pCO2 arterial: 37.6 mmHg (ref 32.0–48.0)
pCO2 arterial: 38.5 mmHg (ref 32.0–48.0)
pCO2 arterial: 31.6 mmHg — ABNORMAL LOW (ref 32.0–48.0)
pH, Arterial: 7.392 (ref 7.350–7.450)
pH, Arterial: 7.428 (ref 7.350–7.450)
pH, Arterial: 7.442 (ref 7.350–7.450)
pH, Arterial: 7.462 — ABNORMAL HIGH (ref 7.350–7.450)
pO2, Arterial: 550 mmHg — ABNORMAL HIGH (ref 83.0–108.0)
pO2, Arterial: 343 mmHg — ABNORMAL HIGH (ref 83.0–108.0)
pO2, Arterial: 403 mmHg — ABNORMAL HIGH (ref 83.0–108.0)
pO2, Arterial: 139 mmHg — ABNORMAL HIGH (ref 83.0–108.0)

## 2020-04-25 LAB — BASIC METABOLIC PANEL
Anion gap: 10 (ref 5–15)
Anion gap: 7 (ref 5–15)
BUN: 15 mg/dL (ref 8–23)
BUN: 12 mg/dL (ref 8–23)
CO2: 27 mmol/L (ref 22–32)
CO2: 22 mmol/L (ref 22–32)
Calcium: 9.2 mg/dL (ref 8.9–10.3)
Calcium: 8.5 mg/dL — ABNORMAL LOW (ref 8.9–10.3)
Chloride: 99 mmol/L (ref 98–111)
Chloride: 107 mmol/L (ref 98–111)
Creatinine, Ser: 0.98 mg/dL (ref 0.44–1.00)
Creatinine, Ser: 0.78 mg/dL (ref 0.44–1.00)
GFR, Estimated: >60 mL/min (ref >60)
GFR, Estimated: >60 mL/min (ref >60)
Glucose, Bld: 100 mg/dL — ABNORMAL HIGH (ref 70–99)
Glucose, Bld: 110 mg/dL — ABNORMAL HIGH (ref 70–99)
Potassium: 4.1 mmol/L (ref 3.5–5.1)
Potassium: 3.7 mmol/L (ref 3.5–5.1)
Sodium: 136 mmol/L (ref 135–145)
Sodium: 136 mmol/L (ref 135–145)

## 2020-04-25 LAB — PROTIME-INR
INR: 1.4 — ABNORMAL HIGH (ref 0.8–1.2)
INR: 1.7 — ABNORMAL HIGH (ref 0.8–1.2)
Prothrombin Time: 16.2 seconds — ABNORMAL HIGH (ref 11.4–15.2)
Prothrombin Time: 19 seconds — ABNORMAL HIGH (ref 11.4–15.2)

## 2020-04-25 LAB — POCT I-STAT, CHEM 8
BUN: 15 mg/dL (ref 8–23)
BUN: 15 mg/dL (ref 8–23)
BUN: 13 mg/dL (ref 8–23)
Calcium, Ion: 1.22 mmol/L (ref 1.15–1.40)
Calcium, Ion: 1.22 mmol/L (ref 1.15–1.40)
Calcium, Ion: 0.88 mmol/L — CL (ref 1.15–1.40)
Chloride: 99 mmol/L (ref 98–111)
Chloride: 100 mmol/L (ref 98–111)
Chloride: 102 mmol/L (ref 98–111)
Creatinine, Ser: 0.7 mg/dL (ref 0.44–1.00)
Creatinine, Ser: 0.8 mg/dL (ref 0.44–1.00)
Creatinine, Ser: 0.6 mg/dL (ref 0.44–1.00)
Glucose, Bld: 109 mg/dL — ABNORMAL HIGH (ref 70–99)
Glucose, Bld: 93 mg/dL (ref 70–99)
Glucose, Bld: 92 mg/dL (ref 70–99)
HCT: 36 % (ref 36.0–46.0)
HCT: 32 % — ABNORMAL LOW (ref 36.0–46.0)
HCT: 19 % — ABNORMAL LOW (ref 36.0–46.0)
Hemoglobin: 12.2 g/dL (ref 12.0–15.0)
Hemoglobin: 10.9 g/dL — ABNORMAL LOW (ref 12.0–15.0)
Hemoglobin: 6.5 g/dL — CL (ref 12.0–15.0)
Potassium: 3.6 mmol/L (ref 3.5–5.1)
Potassium: 3.9 mmol/L (ref 3.5–5.1)
Potassium: 7 mmol/L (ref 3.5–5.1)
Sodium: 139 mmol/L (ref 135–145)
Sodium: 137 mmol/L (ref 135–145)
Sodium: 137 mmol/L (ref 135–145)
TCO2: 28 mmol/L (ref 22–32)
TCO2: 29 mmol/L (ref 22–32)
TCO2: 26 mmol/L (ref 22–32)

## 2020-04-25 LAB — CBC
HCT: 41.9 % (ref 36.0–46.0)
HCT: 34.1 % — ABNORMAL LOW (ref 36.0–46.0)
Hemoglobin: 13.7 g/dL (ref 12.0–15.0)
Hemoglobin: 11.5 g/dL — ABNORMAL LOW (ref 12.0–15.0)
MCH: 27.9 pg (ref 26.0–34.0)
MCH: 29 pg (ref 26.0–34.0)
MCHC: 32.7 g/dL (ref 30.0–36.0)
MCHC: 33.7 g/dL (ref 30.0–36.0)
MCV: 85.3 fL (ref 80.0–100.0)
MCV: 85.9 fL (ref 80.0–100.0)
Platelets: 205 10*3/uL (ref 150–400)
Platelets: 106 10*3/uL — ABNORMAL LOW (ref 150–400)
RBC: 4.91 MIL/uL (ref 3.87–5.11)
RBC: 3.97 MIL/uL (ref 3.87–5.11)
RDW: 13.5 % (ref 11.5–15.5)
RDW: 13.2 % (ref 11.5–15.5)
WBC: 7.5 10*3/uL (ref 4.0–10.5)
WBC: 13.3 10*3/uL — ABNORMAL HIGH (ref 4.0–10.5)
nRBC: 0 % (ref 0.0–0.2)
nRBC: 0 % (ref 0.0–0.2)

## 2020-04-25 LAB — POCT I-STAT EG7
Acid-Base Excess: 0 mmol/L (ref 0.0–2.0)
Bicarbonate: 24.7 mmol/L (ref 20.0–28.0)
Calcium, Ion: 0.91 mmol/L — ABNORMAL LOW (ref 1.15–1.40)
HCT: 19 % — ABNORMAL LOW (ref 36.0–46.0)
Hemoglobin: 6.5 g/dL — CL (ref 12.0–15.0)
O2 Saturation: 85 %
Potassium: 6.3 mmol/L (ref 3.5–5.1)
Sodium: 138 mmol/L (ref 135–145)
TCO2: 26 mmol/L (ref 22–32)
pCO2, Ven: 37 mmHg — ABNORMAL LOW (ref 44.0–60.0)
pH, Ven: 7.433 — ABNORMAL HIGH (ref 7.250–7.430)
pO2, Ven: 49 mmHg — ABNORMAL HIGH (ref 32.0–45.0)

## 2020-04-25 LAB — ECHO INTRAOPERATIVE TEE
AV Mean grad: 4 mmHg
Height: 67 in
Weight: 1915.36 oz

## 2020-04-25 LAB — GLUCOSE, CAPILLARY
Glucose-Capillary: 130 mg/dL — ABNORMAL HIGH (ref 70–99)
Glucose-Capillary: 132 mg/dL — ABNORMAL HIGH (ref 70–99)
Glucose-Capillary: 114 mg/dL — ABNORMAL HIGH (ref 70–99)
Glucose-Capillary: 107 mg/dL — ABNORMAL HIGH (ref 70–99)
Glucose-Capillary: 121 mg/dL — ABNORMAL HIGH (ref 70–99)

## 2020-04-25 LAB — PREPARE RBC (CROSSMATCH)

## 2020-04-25 LAB — PLATELET COUNT: Platelets: 88 10*3/uL — ABNORMAL LOW (ref 150–400)

## 2020-04-25 LAB — HEMOGLOBIN AND HEMATOCRIT, BLOOD
HCT: 20.4 % — ABNORMAL LOW (ref 36.0–46.0)
Hemoglobin: 6.8 g/dL — CL (ref 12.0–15.0)

## 2020-04-25 LAB — HEPARIN LEVEL (UNFRACTIONATED): Heparin Unfractionated: 1.06 IU/mL — ABNORMAL HIGH (ref 0.30–0.70)

## 2020-04-25 LAB — APTT: aPTT: 36 seconds (ref 24–36)

## 2020-04-25 LAB — MAGNESIUM: Magnesium: 2.7 mg/dL — ABNORMAL HIGH (ref 1.7–2.4)

## 2020-04-25 LAB — PATHOLOGIST SMEAR REVIEW

## 2020-04-25 SURGERY — CORONARY ARTERY BYPASS GRAFTING (CABG)
Anesthesia: General | Site: Chest

## 2020-04-25 MED ORDER — NITROGLYCERIN IN D5W 200-5 MCG/ML-% IV SOLN: 0.0000 ug/min | INTRAVENOUS | Status: DC

## 2020-04-25 MED ORDER — LACTATED RINGERS IV SOLN: INTRAVENOUS | Status: DC | PRN

## 2020-04-25 MED ORDER — LEVETIRACETAM 100 MG/ML PO SOLN
1500.0000 mg | Freq: Two times a day (BID) | ORAL | Status: DC
  Administered 2020-04-25 – 2020-04-26 (×2): 1500 mg
  Filled 2020-04-25 (×2): qty 15

## 2020-04-25 MED ORDER — PHENYLEPHRINE 40 MCG/ML (10ML) SYRINGE FOR IV PUSH (FOR BLOOD PRESSURE SUPPORT)
PREFILLED_SYRINGE | INTRAVENOUS | Status: AC
  Filled 2020-04-25: qty 30

## 2020-04-25 MED ORDER — BISACODYL 5 MG PO TBEC
10.0000 mg | DELAYED_RELEASE_TABLET | Freq: Every day | ORAL | Status: DC
  Administered 2020-04-26 – 2020-05-05 (×5): 10 mg via ORAL
  Filled 2020-04-25 (×5): qty 2

## 2020-04-25 MED ORDER — HEMOSTATIC AGENTS (NO CHARGE) OPTIME
TOPICAL | Status: DC | PRN
  Administered 2020-04-25: 1 via TOPICAL

## 2020-04-25 MED ORDER — LACTATED RINGERS IV SOLN
500.0000 mL | Freq: Once | INTRAVENOUS | Status: AC | PRN
  Administered 2020-04-26: 500 mL via INTRAVENOUS

## 2020-04-25 MED ORDER — PROTAMINE SULFATE 10 MG/ML IV SOLN
INTRAVENOUS | Status: DC | PRN
  Administered 2020-04-25: 10 mg via INTRAVENOUS
  Administered 2020-04-25: 35 mg via INTRAVENOUS
  Administered 2020-04-25: 25 mg via INTRAVENOUS
  Administered 2020-04-25 (×2): 35 mg via INTRAVENOUS
  Administered 2020-04-25: 25 mg via INTRAVENOUS

## 2020-04-25 MED ORDER — SODIUM CHLORIDE 0.9% IV SOLUTION: Freq: Once | INTRAVENOUS | Status: AC

## 2020-04-25 MED ORDER — ACETAMINOPHEN 650 MG RE SUPP
650.0000 mg | Freq: Once | RECTAL | Status: AC
  Administered 2020-04-25: 650 mg via RECTAL

## 2020-04-25 MED ORDER — SODIUM CHLORIDE 0.9 % IV SOLN
1.5000 g | Freq: Two times a day (BID) | INTRAVENOUS | Status: AC
  Administered 2020-04-25 – 2020-04-27 (×4): 1.5 g via INTRAVENOUS
  Filled 2020-04-25 (×3): qty 1.5

## 2020-04-25 MED ORDER — ATORVASTATIN CALCIUM 80 MG PO TABS
80.0000 mg | ORAL_TABLET | Freq: Every day | ORAL | Status: DC
  Administered 2020-04-25: 80 mg
  Filled 2020-04-25: qty 1

## 2020-04-25 MED ORDER — LACOSAMIDE 10 MG/ML PO SOLN
100.0000 mg | Freq: Two times a day (BID) | ORAL | Status: DC
  Administered 2020-04-25: 100 mg
  Filled 2020-04-25 (×2): qty 10

## 2020-04-25 MED ORDER — SODIUM CHLORIDE 0.9% FLUSH: 3.0000 mL | INTRAVENOUS | Status: DC | PRN

## 2020-04-25 MED ORDER — CHLORHEXIDINE GLUCONATE 0.12 % MT SOLN: 15.0000 mL | Freq: Once | OROMUCOSAL | Status: AC

## 2020-04-25 MED ORDER — METOPROLOL TARTRATE 5 MG/5ML IV SOLN: 2.5000 mg | INTRAVENOUS | Status: DC | PRN

## 2020-04-25 MED ORDER — TRAMADOL HCL 50 MG PO TABS
50.0000 mg | ORAL_TABLET | ORAL | Status: DC | PRN
  Administered 2020-04-26 – 2020-05-04 (×2): 50 mg via ORAL
  Administered 2020-05-05: 100 mg via ORAL
  Filled 2020-04-25: qty 1
  Filled 2020-04-25: qty 2
  Filled 2020-04-25: qty 1

## 2020-04-25 MED ORDER — CHLORHEXIDINE GLUCONATE CLOTH 2 % EX PADS
6.0000 | MEDICATED_PAD | Freq: Every day | CUTANEOUS | Status: DC
  Administered 2020-04-28 – 2020-05-03 (×6): 6 via TOPICAL

## 2020-04-25 MED ORDER — ALBUMIN HUMAN 5 % IV SOLN
250.0000 mL | INTRAVENOUS | Status: AC | PRN
  Administered 2020-04-25: 12.5 g via INTRAVENOUS

## 2020-04-25 MED ORDER — 0.9 % SODIUM CHLORIDE (POUR BTL) OPTIME
TOPICAL | Status: DC | PRN
  Administered 2020-04-25: 5000 mL

## 2020-04-25 MED ORDER — ACETAMINOPHEN 160 MG/5ML PO SOLN
1000.0000 mg | Freq: Four times a day (QID) | ORAL | Status: DC
  Administered 2020-04-25 – 2020-04-28 (×3): 1000 mg
  Filled 2020-04-25 (×3): qty 40.6

## 2020-04-25 MED ORDER — METOPROLOL TARTRATE 12.5 MG HALF TABLET: 12.5000 mg | ORAL_TABLET | Freq: Two times a day (BID) | ORAL | Status: DC

## 2020-04-25 MED ORDER — CHLORHEXIDINE GLUCONATE 0.12 % MT SOLN
OROMUCOSAL | Status: AC
  Administered 2020-04-25: 15 mL via OROMUCOSAL
  Filled 2020-04-25: qty 15

## 2020-04-25 MED ORDER — INSULIN REGULAR(HUMAN) IN NACL 100-0.9 UT/100ML-% IV SOLN: INTRAVENOUS | Status: DC

## 2020-04-25 MED ORDER — PROPOFOL 10 MG/ML IV BOLUS
INTRAVENOUS | Status: AC
  Filled 2020-04-25: qty 20

## 2020-04-25 MED ORDER — PANTOPRAZOLE SODIUM 40 MG PO TBEC
40.0000 mg | DELAYED_RELEASE_TABLET | Freq: Every day | ORAL | Status: DC
  Administered 2020-04-27 – 2020-05-05 (×9): 40 mg via ORAL
  Filled 2020-04-25 (×9): qty 1

## 2020-04-25 MED ORDER — SODIUM CHLORIDE (PF) 0.9 % IJ SOLN
OROMUCOSAL | Status: DC | PRN
  Administered 2020-04-25 (×3): 4 mL via TOPICAL

## 2020-04-25 MED ORDER — ONDANSETRON HCL 4 MG/2ML IJ SOLN
4.0000 mg | Freq: Four times a day (QID) | INTRAMUSCULAR | Status: DC | PRN
  Administered 2020-04-27: 4 mg via INTRAVENOUS
  Filled 2020-04-25: qty 2

## 2020-04-25 MED ORDER — PROTAMINE SULFATE 10 MG/ML IV SOLN
INTRAVENOUS | Status: AC
  Filled 2020-04-25: qty 25

## 2020-04-25 MED ORDER — ROCURONIUM BROMIDE 10 MG/ML (PF) SYRINGE
PREFILLED_SYRINGE | INTRAVENOUS | Status: DC | PRN
  Administered 2020-04-25: 70 mg via INTRAVENOUS
  Administered 2020-04-25: 30 mg via INTRAVENOUS

## 2020-04-25 MED ORDER — ROCURONIUM BROMIDE 10 MG/ML (PF) SYRINGE
PREFILLED_SYRINGE | INTRAVENOUS | Status: AC
  Filled 2020-04-25: qty 30

## 2020-04-25 MED ORDER — FENTANYL CITRATE (PF) 250 MCG/5ML IJ SOLN
INTRAMUSCULAR | Status: AC
  Filled 2020-04-25: qty 25

## 2020-04-25 MED ORDER — ACETAMINOPHEN 160 MG/5ML PO SOLN: 650.0000 mg | Freq: Once | ORAL | Status: AC

## 2020-04-25 MED ORDER — POTASSIUM CHLORIDE 10 MEQ/50ML IV SOLN: 10.0000 meq | INTRAVENOUS | Status: AC

## 2020-04-25 MED ORDER — METOPROLOL TARTRATE 25 MG/10 ML ORAL SUSPENSION: 12.5000 mg | Freq: Two times a day (BID) | ORAL | Status: DC

## 2020-04-25 MED ORDER — LACTATED RINGERS IV SOLN: INTRAVENOUS | Status: DC

## 2020-04-25 MED ORDER — DEXMEDETOMIDINE HCL IN NACL 400 MCG/100ML IV SOLN: 0.0000 ug/kg/h | INTRAVENOUS | Status: DC

## 2020-04-25 MED ORDER — MIDAZOLAM HCL (PF) 10 MG/2ML IJ SOLN
INTRAMUSCULAR | Status: AC
  Filled 2020-04-25: qty 2

## 2020-04-25 MED ORDER — MAGNESIUM SULFATE 4 GM/100ML IV SOLN
4.0000 g | Freq: Once | INTRAVENOUS | Status: AC
  Administered 2020-04-25: 4 g via INTRAVENOUS
  Filled 2020-04-25: qty 100

## 2020-04-25 MED ORDER — MILRINONE LACTATE IN DEXTROSE 20-5 MG/100ML-% IV SOLN
INTRAVENOUS | Status: DC | PRN
  Administered 2020-04-25: .25 ug/kg/min via INTRAVENOUS

## 2020-04-25 MED ORDER — POTASSIUM CHLORIDE 20 MEQ PO PACK
20.0000 meq | PACK | ORAL | Status: AC
Start: 2020-04-25 — End: 2020-04-26
  Administered 2020-04-25 – 2020-04-26 (×3): 20 meq
  Filled 2020-04-25: qty 1

## 2020-04-25 MED ORDER — ALBUMIN HUMAN 5 % IV SOLN: INTRAVENOUS | Status: DC | PRN

## 2020-04-25 MED ORDER — MIDAZOLAM HCL 5 MG/5ML IJ SOLN
INTRAMUSCULAR | Status: DC | PRN
  Administered 2020-04-25 (×3): 1 mg via INTRAVENOUS

## 2020-04-25 MED ORDER — HEPARIN SODIUM (PORCINE) 1000 UNIT/ML IJ SOLN
INTRAMUSCULAR | Status: AC
  Filled 2020-04-25: qty 1

## 2020-04-25 MED ORDER — VANCOMYCIN HCL IN DEXTROSE 1-5 GM/200ML-% IV SOLN
1000.0000 mg | Freq: Once | INTRAVENOUS | Status: AC
  Administered 2020-04-26: 1000 mg via INTRAVENOUS
  Filled 2020-04-25: qty 200

## 2020-04-25 MED ORDER — OXYCODONE HCL 5 MG PO TABS
5.0000 mg | ORAL_TABLET | ORAL | Status: DC | PRN
Start: 2020-04-25 — End: 2020-05-06

## 2020-04-25 MED ORDER — MILRINONE LACTATE IN DEXTROSE 20-5 MG/100ML-% IV SOLN
0.3000 ug/kg/min | INTRAVENOUS | Status: DC
  Administered 2020-04-26: 0.3 ug/kg/min via INTRAVENOUS
  Filled 2020-04-25: qty 100

## 2020-04-25 MED ORDER — SODIUM CHLORIDE 0.9% FLUSH
3.0000 mL | Freq: Two times a day (BID) | INTRAVENOUS | Status: DC
  Administered 2020-04-26 – 2020-05-05 (×18): 3 mL via INTRAVENOUS

## 2020-04-25 MED ORDER — HEPARIN SODIUM (PORCINE) 1000 UNIT/ML IJ SOLN
INTRAMUSCULAR | Status: DC | PRN
  Administered 2020-04-25: 20000 [IU] via INTRAVENOUS

## 2020-04-25 MED ORDER — FENTANYL CITRATE (PF) 250 MCG/5ML IJ SOLN
INTRAMUSCULAR | Status: DC | PRN
  Administered 2020-04-25 (×2): 150 ug via INTRAVENOUS
  Administered 2020-04-25: 200 ug via INTRAVENOUS
  Administered 2020-04-25: 100 ug via INTRAVENOUS
  Administered 2020-04-25: 50 ug via INTRAVENOUS
  Administered 2020-04-25: 250 ug via INTRAVENOUS
  Administered 2020-04-25 (×2): 50 ug via INTRAVENOUS

## 2020-04-25 MED ORDER — SODIUM CHLORIDE 0.9 % IV SOLN: 250.0000 mL | INTRAVENOUS | Status: DC

## 2020-04-25 MED ORDER — PHENYLEPHRINE HCL-NACL 20-0.9 MG/250ML-% IV SOLN: 0.0000 ug/min | INTRAVENOUS | Status: DC

## 2020-04-25 MED ORDER — ORAL CARE MOUTH RINSE: 15.0000 mL | Freq: Once | OROMUCOSAL | Status: AC

## 2020-04-25 MED ORDER — MIDAZOLAM HCL 2 MG/2ML IJ SOLN: 2.0000 mg | INTRAMUSCULAR | Status: DC | PRN

## 2020-04-25 MED ORDER — DEXTROSE 50 % IV SOLN
0.0000 mL | INTRAVENOUS | Status: DC | PRN
Start: 2020-04-25 — End: 2020-04-27

## 2020-04-25 MED ORDER — PHENYLEPHRINE 40 MCG/ML (10ML) SYRINGE FOR IV PUSH (FOR BLOOD PRESSURE SUPPORT)
PREFILLED_SYRINGE | INTRAVENOUS | Status: DC | PRN
  Administered 2020-04-25: 120 ug via INTRAVENOUS
  Administered 2020-04-25: 40 ug via INTRAVENOUS
  Administered 2020-04-25: 80 ug via INTRAVENOUS
  Administered 2020-04-25: 40 ug via INTRAVENOUS

## 2020-04-25 MED ORDER — MORPHINE SULFATE (PF) 2 MG/ML IV SOLN
1.0000 mg | INTRAVENOUS | Status: DC | PRN
  Administered 2020-04-26: 2 mg via INTRAVENOUS
  Filled 2020-04-25: qty 1

## 2020-04-25 MED ORDER — PROPOFOL 10 MG/ML IV BOLUS
INTRAVENOUS | Status: DC | PRN
  Administered 2020-04-25: 10 mg via INTRAVENOUS
  Administered 2020-04-25: 20 mg via INTRAVENOUS

## 2020-04-25 MED ORDER — SODIUM CHLORIDE 0.9% IV SOLUTION: Freq: Once | INTRAVENOUS | Status: DC

## 2020-04-25 MED ORDER — ASPIRIN 81 MG PO CHEW
324.0000 mg | CHEWABLE_TABLET | Freq: Every day | ORAL | Status: DC
  Administered 2020-04-27 – 2020-04-28 (×2): 324 mg
  Filled 2020-04-25 (×2): qty 4

## 2020-04-25 MED ORDER — CHLORHEXIDINE GLUCONATE 0.12 % MT SOLN
15.0000 mL | OROMUCOSAL | Status: AC
  Administered 2020-04-25: 15 mL via OROMUCOSAL

## 2020-04-25 MED ORDER — DOCUSATE SODIUM 100 MG PO CAPS
200.0000 mg | ORAL_CAPSULE | Freq: Every day | ORAL | Status: DC
  Administered 2020-04-26 – 2020-04-28 (×3): 200 mg via ORAL
  Filled 2020-04-25 (×5): qty 2

## 2020-04-25 MED ORDER — ACETAMINOPHEN 500 MG PO TABS
1000.0000 mg | ORAL_TABLET | Freq: Four times a day (QID) | ORAL | Status: AC
  Administered 2020-04-26 – 2020-04-30 (×13): 1000 mg via ORAL
  Filled 2020-04-25 (×13): qty 2

## 2020-04-25 MED ORDER — FAMOTIDINE IN NACL 20-0.9 MG/50ML-% IV SOLN
20.0000 mg | Freq: Two times a day (BID) | INTRAVENOUS | Status: AC
  Administered 2020-04-25 (×2): 20 mg via INTRAVENOUS
  Filled 2020-04-25: qty 50

## 2020-04-25 MED ORDER — BISACODYL 10 MG RE SUPP: 10.0000 mg | Freq: Every day | RECTAL | Status: DC

## 2020-04-25 MED ORDER — SODIUM CHLORIDE 0.45 % IV SOLN: INTRAVENOUS | Status: DC | PRN

## 2020-04-25 MED ORDER — ASPIRIN EC 325 MG PO TBEC
325.0000 mg | DELAYED_RELEASE_TABLET | Freq: Every day | ORAL | Status: DC
  Administered 2020-04-26: 325 mg via ORAL
  Filled 2020-04-25: qty 1

## 2020-04-25 MED ORDER — SODIUM CHLORIDE 0.9 % IV SOLN: INTRAVENOUS | Status: DC

## 2020-04-25 MED ORDER — CHLORHEXIDINE GLUCONATE CLOTH 2 % EX PADS
6.0000 | MEDICATED_PAD | Freq: Every day | CUTANEOUS | Status: DC
  Administered 2020-04-25: 6 via TOPICAL

## 2020-04-25 MED ORDER — BACLOFEN 20 MG PO TABS
20.0000 mg | ORAL_TABLET | Freq: Two times a day (BID) | ORAL | Status: DC
  Administered 2020-04-25 – 2020-04-26 (×2): 20 mg
  Filled 2020-04-25 (×2): qty 1

## 2020-04-25 SURGICAL SUPPLY — 89 items
ADH SKN CLS APL DERMABOND .7 (GAUZE/BANDAGES/DRESSINGS) ×2
BAG DECANTER FOR FLEXI CONT (MISCELLANEOUS) ×3 IMPLANT
BLADE CLIPPER SURG (BLADE) ×2 IMPLANT
BLADE STERNUM SYSTEM 6 (BLADE) ×3 IMPLANT
BLADE SURG 11 STRL SS (BLADE) ×1 IMPLANT
BNDG ELASTIC 4X5.8 VLCR STR LF (GAUZE/BANDAGES/DRESSINGS) ×3 IMPLANT
BNDG ELASTIC 6X5.8 VLCR STR LF (GAUZE/BANDAGES/DRESSINGS) ×3 IMPLANT
BNDG GAUZE ELAST 4 BULKY (GAUZE/BANDAGES/DRESSINGS) ×3 IMPLANT
CANISTER SUCT 3000ML PPV (MISCELLANEOUS) ×3 IMPLANT
CANNULA EZ GLIDE AORTIC 21FR (CANNULA) ×3 IMPLANT
CATH CPB KIT HENDRICKSON (MISCELLANEOUS) ×3 IMPLANT
CATH ROBINSON RED A/P 18FR (CATHETERS) ×3 IMPLANT
CATH THORACIC 36FR (CATHETERS) ×3 IMPLANT
CATH THORACIC 36FR RT ANG (CATHETERS) ×3 IMPLANT
CLIP VESOCCLUDE MED 24/CT (CLIP) IMPLANT
CLIP VESOCCLUDE SM WIDE 24/CT (CLIP) ×1 IMPLANT
DEFOGGER ANTIFOG KIT (MISCELLANEOUS) ×1 IMPLANT
DERMABOND ADVANCED (GAUZE/BANDAGES/DRESSINGS) ×1
DERMABOND ADVANCED .7 DNX12 (GAUZE/BANDAGES/DRESSINGS) IMPLANT
DRAPE CARDIOVASCULAR INCISE (DRAPES) ×3
DRAPE SLUSH/WARMER DISC (DRAPES) ×3 IMPLANT
DRAPE SRG 135X102X78XABS (DRAPES) ×2 IMPLANT
DRSG AQUACEL AG ADV 3.5X14 (GAUZE/BANDAGES/DRESSINGS) ×1 IMPLANT
DRSG COVADERM 4X14 (GAUZE/BANDAGES/DRESSINGS) ×3 IMPLANT
DRSG COVADERM 4X8 (GAUZE/BANDAGES/DRESSINGS) ×1 IMPLANT
ELECT REM PT RETURN 9FT ADLT (ELECTROSURGICAL) ×6
ELECTRODE REM PT RTRN 9FT ADLT (ELECTROSURGICAL) ×4 IMPLANT
FELT TEFLON 1X6 (MISCELLANEOUS) ×5 IMPLANT
GAUZE SPONGE 4X4 12PLY STRL (GAUZE/BANDAGES/DRESSINGS) ×6 IMPLANT
GLOVE SURG SIGNA 7.5 PF LTX (GLOVE) ×8 IMPLANT
GOWN STRL REUS W/ TWL LRG LVL3 (GOWN DISPOSABLE) ×8 IMPLANT
GOWN STRL REUS W/ TWL XL LVL3 (GOWN DISPOSABLE) ×4 IMPLANT
GOWN STRL REUS W/TWL LRG LVL3 (GOWN DISPOSABLE) ×15
GOWN STRL REUS W/TWL XL LVL3 (GOWN DISPOSABLE) ×15
HEMOSTAT POWDER SURGIFOAM 1G (HEMOSTASIS) ×9 IMPLANT
HEMOSTAT SURGICEL 2X14 (HEMOSTASIS) ×3 IMPLANT
INSERT FOGARTY XLG (MISCELLANEOUS) IMPLANT
KIT BASIN OR (CUSTOM PROCEDURE TRAY) ×3 IMPLANT
KIT SUCTION CATH 14FR (SUCTIONS) ×6 IMPLANT
KIT TURNOVER KIT B (KITS) ×3 IMPLANT
KIT VASOVIEW HEMOPRO 2 VH 4000 (KITS) ×3 IMPLANT
LEAD PACING MYOCARDI (MISCELLANEOUS) ×1 IMPLANT
MARKER GRAFT CORONARY BYPASS (MISCELLANEOUS) ×9 IMPLANT
NS IRRIG 1000ML POUR BTL (IV SOLUTION) ×15 IMPLANT
PACK E OPEN HEART (SUTURE) ×3 IMPLANT
PACK OPEN HEART (CUSTOM PROCEDURE TRAY) ×3 IMPLANT
PAD ARMBOARD 7.5X6 YLW CONV (MISCELLANEOUS) ×6 IMPLANT
PAD ELECT DEFIB RADIOL ZOLL (MISCELLANEOUS) ×3 IMPLANT
PENCIL BUTTON HOLSTER BLD 10FT (ELECTRODE) ×3 IMPLANT
POSITIONER HEAD DONUT 9IN (MISCELLANEOUS) ×3 IMPLANT
PUNCH AORTIC ROTATE 4.0MM (MISCELLANEOUS) IMPLANT
PUNCH AORTIC ROTATE 4.5MM 8IN (MISCELLANEOUS) ×1 IMPLANT
PUNCH AORTIC ROTATE 5MM 8IN (MISCELLANEOUS) IMPLANT
SET CARDIOPLEGIA MPS 5001102 (MISCELLANEOUS) ×1 IMPLANT
SUPPORT HEART JANKE-BARRON (MISCELLANEOUS) ×3 IMPLANT
SUT BONE WAX W31G (SUTURE) ×3 IMPLANT
SUT MNCRL AB 4-0 PS2 18 (SUTURE) ×1 IMPLANT
SUT PROLENE 3 0 SH DA (SUTURE) ×3 IMPLANT
SUT PROLENE 4 0 RB 1 (SUTURE) ×6
SUT PROLENE 4 0 SH DA (SUTURE) IMPLANT
SUT PROLENE 4-0 RB1 .5 CRCL 36 (SUTURE) IMPLANT
SUT PROLENE 5 0 C 1 36 (SUTURE) ×1 IMPLANT
SUT PROLENE 6 0 C 1 30 (SUTURE) ×6 IMPLANT
SUT PROLENE 7 0 BV 1 (SUTURE) ×3 IMPLANT
SUT PROLENE 7 0 BV1 MDA (SUTURE) ×3 IMPLANT
SUT PROLENE 8 0 BV175 6 (SUTURE) IMPLANT
SUT SILK  1 MH (SUTURE) ×3
SUT SILK 1 MH (SUTURE) IMPLANT
SUT STEEL 6MS V (SUTURE) ×3 IMPLANT
SUT STEEL STERNAL CCS#1 18IN (SUTURE) IMPLANT
SUT STEEL SZ 6 DBL 3X14 BALL (SUTURE) ×3 IMPLANT
SUT VIC AB 1 CTX 27 (SUTURE) ×1 IMPLANT
SUT VIC AB 1 CTX 36 (SUTURE) ×6
SUT VIC AB 1 CTX36XBRD ANBCTR (SUTURE) ×4 IMPLANT
SUT VIC AB 2-0 CT1 27 (SUTURE) ×3
SUT VIC AB 2-0 CT1 TAPERPNT 27 (SUTURE) IMPLANT
SUT VIC AB 2-0 CTX 27 (SUTURE) IMPLANT
SUT VIC AB 3-0 SH 27 (SUTURE)
SUT VIC AB 3-0 SH 27X BRD (SUTURE) IMPLANT
SUT VIC AB 3-0 X1 27 (SUTURE) IMPLANT
SUT VICRYL 4-0 PS2 18IN ABS (SUTURE) IMPLANT
SYSTEM SAHARA CHEST DRAIN ATS (WOUND CARE) ×3 IMPLANT
TAPE PAPER 3X10 WHT MICROPORE (GAUZE/BANDAGES/DRESSINGS) ×1 IMPLANT
TOWEL GREEN STERILE (TOWEL DISPOSABLE) ×3 IMPLANT
TOWEL GREEN STERILE FF (TOWEL DISPOSABLE) ×3 IMPLANT
TRAY FOLEY SLVR 16FR TEMP STAT (SET/KITS/TRAYS/PACK) ×3 IMPLANT
TUBING LAP HI FLOW INSUFFLATIO (TUBING) ×3 IMPLANT
UNDERPAD 30X36 HEAVY ABSORB (UNDERPADS AND DIAPERS) ×3 IMPLANT
WATER STERILE IRR 1000ML POUR (IV SOLUTION) ×6 IMPLANT

## 2020-04-25 NOTE — Hospital Course (Addendum)
History of Present Illness:  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  She also has a seizure disorder that is managed with Keppra.  Other past medical history include hypertension, dyslipidemia, and sickle cell anemia.  She has known cardiomyopathy with chronic combined diastolic and systolic heart failure.  She had an abnormal Myoview stress test in 2020 that showed large severe fixed inferior, anterior apical, and inferolateral perfusion defects.  Ejection fraction was estimated at 34%.   [name] was in her usual state of health until yesterday when her husband witnessed what he thought was a seizure.  [name] had just gotten dressed to go out with her husband and was putting a scarf around her neck when he noticed her making gurgling sounds.  He noticed that she had slumped into a chair but had not lost consciousness.  EMS was summonedand a cardiac monitor was placed. She was found to be in complete heart block.  She was transported to [facility] ED while being percutaneously paced. She can not remember if she had any chest pain during this episode.   Once in the emergency room, a transvenous lead was placed emergently and pacing continued.  She was admitted to the hospital for further work-up and monitoring.  Her digoxin and carvedilol that she had been taking regularly were held.  Since admission, she is regained one-to-one conduction.  The heart failure team was consulted.  Left heart catheterization was recommended and was carried out earlier today.  This demonstrates a 70% distal left main coronary artery stenosis along with a 70% stenosis in the proximal circumflex.  Repeat echo has been performed this admission and redemonstrates an ejection fraction of around 35%.  There is moderate aortic valve sclerosis without  significant stenosis.  The mitral valve had trivial insufficiency, no significant stenosis.   [name] is aphasic and has right hemiplegia.  According to her husband and chart review, she continues to function well at home taking care of her own activities of daily living. She washes and dresses herself, cooks, irons clothing and ambulates with a cane.   Cardiothoracic surgery consultation was requested and the patient was evaluated by Dr. Roxan Hockey.  It was felt that PCI would be technically difficult.  She was felt to be high risk for post operative complications and she would likely have a prolonged recovery due to her previous stroke deficits.  They ultimately wished to proceed with CABG procedure.  The risks and benefits of the procedure were explained to the patient and she was agreeable to proceed.   Hospital Course:  [name] was taken to the operating room on 04/25/2020.  She underwent CABG x 2 utilizing LIMA to LAD and SVG to OM.  She also underwent endoscopic harvest of her greater saphenous vein from her right thigh.  She tolerated the procedure without difficulty and was taken to the SICU in stable condition.  POD 1 she remained intubated with CHB. Her renal function remained stable. We discontinued her chest tubes and consulted PT/OT for baseline weakness from previous stroke. Speech therapy was consulted for diet recommendations given her aspiration risk.  Dr. Aundra Dubin was consulted for assistance with her chronic systolic CHF. Joesph July and Dr. Curt Bears continued to follow her for her AV conduction issues and give their advice. POD 3 she continued to slowly progress. We were able  to wean her milrinone and discontinue her central line. Her CXR showed bibasilar atelectasis which we continued to encourage her incentive spirometer use. We started the process for SNF placement and insurance approval. POD 4 we continued IV lasix for fluid overload. We continued lovenox for DVT prophylaxis. EP  decided to go forward with PPM placement on 04/29/2020.   Addendum: Patient was felt surgically stable for transfer from the ICU to [facility] for further convalescence on 04/10. She continues to be AV paced with good hemodynamics. She has been started on Entresto and Coumadin (has had previous stroke with aphasia and right-sided weakness). She is also on Spironolactone to help with heart failure.

## 2020-04-25 NOTE — Anesthesia Postprocedure Evaluation (Signed)
Anesthesia Post Note  Patient: [name]  Procedure(s) Performed: CORONARY ARTERY BYPASS GRAFTING (CABG) TIMES TWO USING LEFT INTERNAL MAMMARY ARTERY AND RIGHT GREATER SAPHENOUS VEIN HARVESTED ENDOSCOPICALLY (N/A Chest) TRANSESOPHAGEAL ECHOCARDIOGRAM (TEE)     Patient location during evaluation: SICU Anesthesia Type: General Level of consciousness: sedated and patient remains intubated per anesthesia plan Pain management: pain level controlled Vital Signs Assessment: post-procedure vital signs reviewed and stable Respiratory status: patient remains intubated per anesthesia plan and patient on ventilator - see flowsheet for VS Cardiovascular status: stable (remains on milrinone and phenylephrine) Postop Assessment: no apparent nausea or vomiting Anesthetic complications: no Comments: PA catheter became dislodged during transport, unable to position back in PA, Dr. Roxan Hockey aware and will use FlowTrac for post op management, PA cath removed.   No complications documented.  Last Vitals:  Vitals:   04/25/20 1225 04/25/20 1730  BP:    Pulse: 65 80  Resp: 17   Temp:    SpO2: 98% 100%    Last Pain:  Vitals:   04/25/20 1125  TempSrc: Oral  PainSc:                  Jaaziel Peatross,E. Genavieve Mangiapane

## 2020-04-25 NOTE — Brief Op Note (Signed)
04/21/2020 - 04/25/2020  5:17 PM  PATIENT:  [name]  70 y.o. female  PRE-OPERATIVE DIAGNOSIS:  Left main and 2 vessel coronary artery disease   POST-OPERATIVE DIAGNOSIS:   Left main and 2 vessel coronary artery disease  PROCEDURE:  Procedure(s):  CORONARY ARTERY BYPASS GRAFTING x 2 -LIMA to LAD -SVG to OM  ENDOSCOPIC HARVEST GREATER SAPHENOUS VEIN -Right Thigh  TRANSESOPHAGEAL ECHOCARDIOGRAM (TEE)  SURGEON:  Surgeon(s) and Role:    Melrose Nakayama, MD - Primary  PHYSICIAN ASSISTANT: Ellwood Handler PA-C  ANESTHESIA:   general  EBL: 300 ml  BLOOD ADMINISTERED: PRBC and CELLSAVER  DRAINS: Left and Right Pleural Chest tubes, mediastinal chest drains   LOCAL MEDICATIONS USED:  NONE  SPECIMEN:  No Specimen  DISPOSITION OF SPECIMEN:  N/A  COUNTS:  YES  TOURNIQUET:  * No tourniquets in log *  DICTATION: -done  PLAN OF CARE: Admit to inpatient   PATIENT DISPOSITION:  ICU - intubated and hemodynamically stable.   Delay start of Pharmacological VTE agent (>24hrs) due to surgical blood loss or risk of bleeding: yes

## 2020-04-25 NOTE — Op Note (Signed)
NAME: [name], [name]. MEDICAL RECORD NO: [id number] ACCOUNT NO: [id number] DATE OF BIRTH: 04-Dec-1950 FACILITY: [facility] LOCATION: [facility] PHYSICIAN: Revonda Standard. Roxan Hockey, MD  Operative Report   DATE OF PROCEDURE: 04/25/2020  PREOPERATIVE DIAGNOSIS:  Left main and 2-vessel coronary artery disease.  POSTOPERATIVE DIAGNOSIS:  Left main and 2-vessel coronary artery disease.  PROCEDURES:   Median sternotomy, extracorporeal circulation, Coronary artery bypass grafting x 2  Left internal mammary artery to LAD,  Saphenous vein graft to obtuse marginal 2 Endoscopic vein harvest right thigh.  SURGEON:  Revonda Standard. Roxan Hockey, MD  ASSISTANT:  Ellwood Handler, PA  ANESTHESIA:  General.  FINDINGS:  Transesophageal echocardiography revealed ejection fraction of 35% with mild aortic insufficiency, post-bypass slight improvement in ejection fraction to 40% to 45%.  Good quality conduits.  Good quality targets.  Plaque in the ascending aorta.  CLINICAL NOTE: [name] is a 70 year old woman with a complicated medical history including a previous stroke with aphasia and right hemiparesis.  Despite that, she is able to maintain her own activities of daily living and is ambulatory.  She  presented after a syncopal spell and was found to be in complete heart block.  Further workup revealed left main and 2-vessel coronary artery disease.  The patient was referred for consideration for coronary artery bypass grafting.  After discussion with  the patient and her husband, particularly in regards to the additional risk factors given her previous stroke, the patient did wish to proceed with bypass grafting.  OPERATIVE NOTE: [name] was brought to the preoperative holding area on 04/25/2020.  She had a Swan-Ganz catheter and an arterial blood pressure monitoring line placed by Anesthesia.  She was taken to the operating room, anesthetized, and intubated.  A Foley  catheter was placed.  Intravenous antibiotics  were administered.  Transesophageal echocardiography was performed by Dr. Annye Asa.  Please see her separately dictated notes for full details, but findings as noted above.  The chest, abdomen, and  legs were prepped and draped in the usual sterile fashion.  A timeout was performed.  An incision was made in the medial aspect of the right leg at the level of the knee.  The greater saphenous vein was harvested from the right thigh endoscopically.  It was a good quality vessel.  Simultaneously, a median  sternotomy was performed, and the left internal mammary artery was harvested using standard technique.  It was a good quality vessel with excellent flow when divided distally.  The patient was heparinized prior to dividing the distal end of the mammary  artery.  A sternal retractor was placed and opened gently over time.  The pericardium was opened.  The ascending aorta was inspected.  There was a calcified plaque near the sinotubular junction and then some lateral plaque on the right side of the ascending aorta.  This did  not interfere for sites for cannulation, clamping, or proximal placement.  After confirming adequate anticoagulation with ACT measurement, the aorta was cannulated via concentric 2-0 Ethibond pledgeted pursestring sutures.  A dual-stage venous cannula  was placed via a pursestring suture in the right atrial appendage.  Cardiopulmonary bypass was initiated.  Flows were maintained per protocol.  The patient's body temperature was allowed to drift to 34 degrees Celsius.    The coronary arteries were  inspected.  The anastomotic sites were chosen.  The conduits were inspected and cut to length.  A foam pad was placed in the pericardium to insulate the heart.  A temperature probe  was placed in the myocardial septum, and a cardioplegia cannula was  placed in the ascending aorta.  The aorta was cross clamped.  The left ventricle was emptied via the aortic root vent.  Cardiac arrest  then was achieved with a combination of cold antegrade blood cardioplegia and topical iced saline.  1.5 liters of cardioplegia was administered.  There  was a rapid diastolic arrest, and there was septal cooling to 11 degrees Celsius.  Of note, the transvenous pacer was turned off once the aorta was cross clamped.  A reversed saphenous vein graft was placed end-to-side to the large obtuse marginal branch of the left circumflex.  This vessel bifurcated and the graft was placed to the more distal branch, which was the larger of the two.  It was a 2 mm good quality  target.  The vein was anastomosed end-to-side with a running 7-0 Prolene suture.  A probe passed easily proximally and distally.  Cardioplegia was administered down the graft, and there was good flow and good hemostasis.  The left internal mammary artery was brought through a window in the pericardium.  The distal end was bevelled and then was anastomosed end-to-side to the distal LAD.  These were both 2 mm, good quality vessels.  The end-to-side anastomosis was performed with a  running 8-0 Prolene suture.  At completion of the anastomosis, the bulldog clamp was briefly removed to inspect for hemostasis.  Rapid septal rewarming was noted.  The bulldog clamp was replaced.  The mammary pedicle was tacked to the epicardial surface of  the heart with 6-0 Prolene sutures.  Additional cardioplegia was administered via the aortic root and the vein graft.  The vein graft then was cut to length.  It was anastomosed end-to-side to a 4.5 mm punch aortotomy with a running 6-0 Prolene suture.  At the completion of the  proximal anastomosis, the patient was placed in Trendelenburg position.  Lidocaine was administered.  The aortic root was de-aired, and the aortic crossclamp was removed.  The total crossclamp time was 37 minutes.  The patient was in heart block after  removal of the crossclamp.  The proximal and distal anastomoses were inspected for  hemostasis.  Epicardial pacing wires were placed on the right atrium and right ventricle, and DOO pacing was initiated.  Initially, there was limited capture on the  ventricular wires, so an additional bipolar wire was placed on the inferior wall.  By the time this was connected, there was capture with the other wires, but both sets of wires were used to pace the patient.  She was loaded with milrinone and a milrinone infusion was initiated. When the core temperature reached 37 degrees Celsius, she was weaned from cardiopulmonary bypass on the first attempt.  The total bypass time was 64 minutes.  The initial cardiac index was greater than 2 liters per  minute per meter squared.  A test dose of protamine was administered and was well tolerated.  The atrial and aortic cannulae were removed.  The remainder of the protamine was administered without incident.  Post-bypass transesophageal echocardiography showed some improvement in  wall motion in the inferior wall and slight improvement in the ejection fraction to approximately 45%.  The chest was copiously irrigated with warm saline.  Hemostasis was achieved.  The pericardium was not closed.  Left pleural and mediastinal chest  tubes were placed through separate subcostal incisions.  The sternum was closed with heavy gauge stainless steel wires.  It came together easily.  Initially, after closure of the sternum, there was no change in blood pressure or PA pressures, but the cardiac  index did drop.  The index responded to volume administration.  The pectoralis fascia, subcutaneous tissue, and skin were closed in standard fashion.  All sponge, needle, and instrument counts were correct at the end of the procedure.  The patient was taken  from the operating room to the surgical intensive care unit intubated and in good condition.   ROH D: 04/25/2020 5:28:50 pm T: 04/25/2020 8:38:00 pm  JOB: [id number]/ [id number]

## 2020-04-25 NOTE — Progress Notes (Signed)
[place] for Warfarin > Heparin Indication: stroke  Allergies  Allergen Reactions  . Lexiscan [Regadenoson] Other (See Comments)    Perioral tingling and throat tightness    Patient Measurements: Height: 5\' 7"  (170.2 cm) Weight: 54.3 kg (119 lb 11.4 oz) IBW/kg (Calculated) : 61.6 Heparin Dosing Weight: 57 kg  Vital Signs: Temp: 98.1 F (36.7 C) (04/04 0400) Temp Source: Oral (04/04 0400) BP: 139/78 (04/04 0600) Pulse Rate: 69 (04/04 0600)  Labs: Recent Labs    04/23/20 0010 04/23/20 1306 04/24/20 0300 04/24/20 1309 04/24/20 2056 04/25/20 0617  HGB 13.0  --  12.3  --   --  13.7  HCT 40.2  --  38.3  --   --  41.9  PLT 257  --  224  --   --  205  LABPROT 22.7* 23.8* 22.1* 20.6*  --   --   INR 2.1* 2.2* 2.0* 1.8*  --   --   HEPARINUNFRC  --   --   --   --  0.33  --   CREATININE 1.08*  --  0.94  --   --  0.98    Estimated Creatinine Clearance: 45.1 mL/min (by C-G formula based on SCr of 0.98 mg/dL).   Medical History: Past Medical History:  Diagnosis Date  . Adenomatous polyp of colon 09/2012   repeat colonoscopy in 5 years by Dr. Sharlett Iles  . CHF (congestive heart failure) (HCC)    EF 15-20% as of 05/28/11 (Dr Legrand [facility] D/C summary)  . CVA (cerebral infarction) 12/2007  . Hemiparesis ([place])   . Hyperlipidemia   . Hypertension   . Seizures ([place])   . Sickle cell anemia (HCC)   . Stroke ([place])   . Vitamin D deficiency 2010    Assessment: 70 yo female presents with CHB now s/p temporary pacer. On warfarin PTA for hx embolic stroke, no known hx of afib. INR on admit therapeutic at 2.2. LHC on 4/1 shows LM-LCx disease, PCI held for CABG consult. Pharmacy asked start heparin 8 hr after sheath pull and when INR <2.   PTA warfarin regimen: 5 mg daily except 7.5 mg Mon/Thurs INR on admit: 2.2  INR dropped to <2 earlier yesterday and heparin initiated. Heparin level at goal (0.33 ) this morning on heparin drip 750  utus/hr.  No bleeding issues noted. Surgery planned for tomorrow.   Goal of Therapy: Heparin level 0.3-0.7  INR 2-3 Monitor platelets by anticoagulation protocol: Yes   Plan:  Continue IV heparin at 750 units/hr Daily heparin level and CBC. Follow up post op    Bonnita Nasuti Pharm.D. CPP, BCPS Clinical Pharmacist [phone] 04/25/2020 7:18 AM

## 2020-04-25 NOTE — Progress Notes (Signed)
Patient ID: [name], female   DOB: 12/03/1950, 70 y.o.   MRN: [id number]     Advanced Heart Failure Rounding Note  PCP-Cardiologist: Skeet Latch, MD   Subjective:    She is in NSR, no pacing.  Plan for CABG today.   Echo with EF 35-40%, inferior and lateral HK, normal RV.   Coronary angiography: 60-70% dLM, 60-70% ostial LCx, 99% proximal calcified LCx (large/codominant LCx)  Objective:   Weight Range: 54.3 kg Body mass index is 18.75 kg/m.   Vital Signs:   Temp:  [98.1 F (36.7 C)-98.5 F (36.9 C)] 98.1 F (36.7 C) (04/04 0400) Pulse Rate:  [65-115] 69 (04/04 0600) Resp:  [9-21] 14 (04/04 0600) BP: (106-144)/(64-107) 139/78 (04/04 0600) SpO2:  [92 %-100 %] 96 % (04/04 0600) Weight:  [54.3 kg] 54.3 kg (04/04 0500) Last BM Date: 04/19/20  Weight change: Filed Weights   04/22/20 0400 04/23/20 0500 04/25/20 0500  Weight: 55.5 kg 55.2 kg 54.3 kg    Intake/Output:   Intake/Output Summary (Last 24 hours) at 04/25/2020 0711 Last data filed at 04/25/2020 0600 Gross per 24 hour  Intake 268.15 ml  Output 1350 ml  Net -1081.85 ml      Physical Exam    General: NAD Neck: No JVD, no thyromegaly or thyroid nodule.  Lungs: Clear to auscultation bilaterally with normal respiratory effort. CV: Nondisplaced PMI.  Heart regular S1/S2, no S3/S4, no murmur.  No peripheral edema.   Abdomen: Soft, nontender, no hepatosplenomegaly, no distention.  Skin: Intact without lesions or rashes.  Neurologic: Aphasic, alert Psych: Normal affect. Extremities: No clubbing or cyanosis.  HEENT: Normal.    Telemetry   NSR in 80s (personally reviewed)  Labs    CBC Recent Labs    04/24/20 0300 04/25/20 0617  WBC 7.1 7.5  HGB 12.3 13.7  HCT 38.3 41.9  MCV 88.2 85.3  PLT 224 093   Basic Metabolic Panel Recent Labs    04/24/20 0300 04/25/20 0617  NA 137 136  K 3.7 4.1  CL 104 99  CO2 24 27  GLUCOSE 87 100*  BUN 14 15  CREATININE 0.94 0.98  CALCIUM 8.7*  9.2   Liver Function Tests No results for input(s): AST, ALT, ALKPHOS, BILITOT, PROT, ALBUMIN in the last 72 hours. No results for input(s): LIPASE, AMYLASE in the last 72 hours. Cardiac Enzymes No results for input(s): CKTOTAL, CKMB, CKMBINDEX, TROPONINI in the last 72 hours.  BNP: BNP (last 3 results) No results for input(s): BNP in the last 8760 hours.  ProBNP (last 3 results) No results for input(s): PROBNP in the last 8760 hours.   D-Dimer No results for input(s): DDIMER in the last 72 hours. Hemoglobin A1C No results for input(s): HGBA1C in the last 72 hours. Fasting Lipid Panel No results for input(s): CHOL, HDL, LDLCALC, TRIG, CHOLHDL, LDLDIRECT in the last 72 hours. Thyroid Function Tests No results for input(s): TSH, T4TOTAL, T3FREE, THYROIDAB in the last 72 hours.  Invalid input(s): FREET3  Other results:   Imaging    No results found.   Medications:     Scheduled Medications: . aspirin  81 mg Oral Daily  . atorvastatin  80 mg Oral QHS  . baclofen  20 mg Oral BID  . Chlorhexidine Gluconate Cloth  6 each Topical Daily  . dapagliflozin propanediol  10 mg Oral Daily  . epinephrine  0-10 mcg/min Intravenous To OR  . furosemide  40 mg Oral Daily  . heparin-papaverine-plasmalyte irrigation  Irrigation To OR  . insulin   Intravenous To OR  . lacosamide  100 mg Oral BID  . levETIRAcetam  1,500 mg Oral BID  . magnesium sulfate  40 mEq Other To OR  . phenylephrine  30-200 mcg/min Intravenous To OR  . potassium chloride  80 mEq Other To OR  . sodium chloride flush  3 mL Intravenous Q12H  . spironolactone  12.5 mg Oral Daily  . tranexamic acid  15 mg/kg Intravenous To OR  . tranexamic acid  2 mg/kg Intracatheter To OR    Infusions: . sodium chloride    . cefUROXime (ZINACEF)  IV    . cefUROXime (ZINACEF)  IV    . dexmedetomidine    . heparin 30,000 units/NS 1000 mL solution for CELLSAVER    . heparin 750 Units/hr (04/25/20 0600)  . milrinone    .  nitroGLYCERIN    . norepinephrine    . tranexamic acid (CYKLOKAPRON) infusion ([facility])    . vancomycin      PRN Medications: sodium chloride, acetaminophen, nitroGLYCERIN, ondansetron (ZOFRAN) IV, polyethylene glycol, sodium chloride flush, temazepam, traMADol   Assessment/Plan   1. Complete heart block: Patient had baseline NSR with LAFB/RBBB, so there was underlying conduction system disease.  Suspect this progressed to CHB with beta blocker/digoxin use + chronic ischemia in territory of large  co-dominant LCx.  Off nodal blockers, she remains in NSR. - Off nodal blockers.  - Currently not pacing but will keep TTVP in place for now, will need through CABG.  May be able to avoid long-term PPM with revascularization.  2. Chronic systolic CHF: Most recent prior echo in 8/20 with EF 35-40% and wall motion abnormalities.  Ischemic cardiomyopathy with left main/severe codominant LCx disease.  Echo this admission with stable EF 35-40%, inferior and lateral hypokinesis.  Not significantly volume overloaded on exam. Per her husband, at baseline she is aphasic but can walk around the house and do her ADLs.  - Continue home Lasix and spironolactone.  - Will hold Farxiga prior to surgery - Hold digoxin and Coreg with CHB.  - Hold Entresto with soft BP.  3. 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  Has been on warfarin.  Has had long-standing aphasia and right hemiparesis.  Can walk around house and do ADLs.  - Warfarin on hold, covering with heparin gtt.  - No overt bleeding.  4. H/o seizure disorder: Continue Keppra.  5. ?Sickle cell anemia: Carries this diagnosis but not sure truly present.  6. CAD:  LHC showed 60-70% distal left main stenosis, heavily calcified proximal LCx with 60-70% stenosis at the ostium and discrete 99% stenosis in the proximal LCx.  I suspect that her CHB is related to chronic ischemia from LM-LCx disease worsened by nodal blockade.  Difficult situation.  She has surgical  disease but will be more difficult to rehab after CABG with weakness from prior stroke though per her husband, she does all ADLs and walks with cane at home.  PCI would be possible but high risk/difficult heavy calcification and tortuousity.  - Continue ASA 81, statin.  - Dr. Roxan Hockey has seen and offered CABG. They would like to proceed. CABG today. INR and BMET pending.   Length of Stay: [place], MD  04/25/2020, 7:11 AM  Advanced Heart Failure Team Pager [phone] (M-F; 7a - 5p)  Please contact [facility] for night-coverage after hours (5p -7a ) and weekends on [email or web address]

## 2020-04-25 NOTE — Progress Notes (Signed)
[facility] Evening Rounds  DOS s/p CABG HD stable Receiving FFP BP (!) 113/55   Pulse 80   Temp (!) 93.92 F (34.4 C)   Resp 12   Ht 5\' 7"  (1.702 m)   Wt 54.3 kg   SpO2 100%   BMI 18.75 kg/m  Intubated Stable support devices  Good UOP/Low CT output   Intake/Output Summary (Last 24 hours) at 04/25/2020 1830 Last data filed at 04/25/2020 1651 Gross per 24 hour  Intake 2673.52 ml  Output 2600 ml  Net 73.52 ml    A/p: continue routine post op care [name], [place]

## 2020-04-25 NOTE — Progress Notes (Signed)
    Attempted to wean vent with RT at 2200. Pt only breathing 5-9 times per min and not receiving volumes. Switched back to 40 fiO2 and 12 RR. Will try to wean again in an hour or hour and a half. Will continue to monitor.

## 2020-04-25 NOTE — Progress Notes (Signed)
VASCULAR LAB    Pre CABG Dopplers have been performed.  See CV proc for preliminary results.   Nikole Swartzentruber, RVT 04/25/2020, 9:54 AM

## 2020-04-25 NOTE — Interval H&P Note (Signed)
History and Physical Interval Note:  04/25/2020 11:46 AM  [name]  has presented today for surgery, with the diagnosis of CAD.  The various methods of treatment have been discussed with the patient and family. After consideration of risks, benefits and other options for treatment, the patient has consented to  Procedure(s): CORONARY ARTERY BYPASS GRAFTING (CABG) TEE (N/A) as a surgical intervention.  The patient's history has been reviewed, patient examined, no change in status, stable for surgery.  I have reviewed the patient's chart and labs.  Questions were answered to the patient's satisfaction.     Melrose Nakayama

## 2020-04-25 NOTE — Anesthesia Procedure Notes (Signed)
Central Venous Catheter Insertion Performed by: Annye Asa, MD, anesthesiologist Start/End4/04/2020 12:01 PM, 04/25/2020 12:19 PM Patient location: Pre-op. Preanesthetic checklist: patient identified, IV checked, risks and benefits discussed, surgical consent, monitors and equipment checked, pre-op evaluation, timeout performed and anesthesia consent Position: supine Lidocaine 1% used for infiltration and patient sedated Hand hygiene performed , maximum sterile barriers used  and Seldinger technique used Catheter size: 9 Fr PA cath was placed.MAC introducer Swan type:thermodilution Procedure performed using ultrasound guided technique. Ultrasound Notes:anatomy identified, needle tip was noted to be adjacent to the nerve/plexus identified, no ultrasound evidence of intravascular and/or intraneural injection and image(s) printed for medical record Attempts: 1 Following insertion, line sutured, dressing applied and Biopatch. Post procedure assessment: blood return through all ports, free fluid flow and no air  Patient tolerated the procedure well with no immediate complications. Additional procedure comments: PA catheter:  Routine monitors. Timeout, sterile prep, drape, FBP R neck.  Supine position.  1% [place] local, finder and trocar RIJ 1st pass with US guidance.  MAC introducer placed over J wire. PA catheter in easily.  Sterile dressing applied.  Patient tolerated well, VSS.  Jenita Seashore, MD .

## 2020-04-25 NOTE — Anesthesia Procedure Notes (Signed)
Procedure Name: Intubation Date/Time: 04/25/2020 1:00 PM Performed by: Imagene Riches, CRNA Pre-anesthesia Checklist: Patient identified, Emergency Drugs available, Suction available and Patient being monitored Patient Re-evaluated:Patient Re-evaluated prior to induction Oxygen Delivery Method: Circle System Utilized Preoxygenation: Pre-oxygenation with 100% oxygen Induction Type: IV induction Ventilation: Mask ventilation without difficulty Laryngoscope Size: Mac and 4 Grade View: Grade I Tube type: Oral Tube size: 7.5 mm Number of attempts: 1 Airway Equipment and Method: Stylet and Oral airway Placement Confirmation: ETT inserted through vocal cords under direct vision,  positive ETCO2 and breath sounds checked- equal and bilateral Secured at: 23 cm Tube secured with: Tape Dental Injury: Teeth and Oropharynx as per pre-operative assessment

## 2020-04-25 NOTE — Progress Notes (Signed)
  Echocardiogram Echocardiogram Transesophageal has been performed.  [name] 04/25/2020, 2:56 PM

## 2020-04-25 NOTE — Anesthesia Preprocedure Evaluation (Addendum)
Anesthesia Evaluation  Patient identified by MRN, date of birth, ID band Patient awake    Reviewed: Allergy & Precautions, NPO status , Patient's Chart, lab work & pertinent test results, reviewed documented beta blocker date and time   History of Anesthesia Complications Negative for: history of anesthetic complications  Airway Mallampati: II  TM Distance: >3 FB Neck ROM: Full    Dental  (+) Poor Dentition, Loose, Missing, Dental Advisory Given   Pulmonary former smoker,  04/21/2020 SARS coronavirus NEG   breath sounds clear to auscultation       Cardiovascular hypertension, Pt. on medications and Pt. on home beta blockers (-) angina+ CAD (LM disease 70%, 99% Cx) and +CHF  + dysrhythmias (complete heart block)  Rhythm:Regular Rate:Normal  04/22/2020 cath: 1. LVEDP 7. 2. 60-70% distal left main stenosis.  3. Heavily calcified proximal LCx.  60-70% stenosis at the ostium with discrete 99% stenosis in the proximal LCx.    04/21/2020 ECHO: 35-40%. LV has moderately decreased function. Grade 1 DD, mod AI   Neuro/Psych Seizures -,  CVA (L MCA, R hemiparesis), Residual Symptoms    GI/Hepatic negative GI ROS, Neg liver ROS,   Endo/Other  negative endocrine ROS  Renal/GU negative Renal ROS     Musculoskeletal   Abdominal   Peds  Hematology  (+) Sickle cell anemia , INR 1.4   Anesthesia Other Findings   Reproductive/Obstetrics                            Anesthesia Physical Anesthesia Plan  ASA: IV  Anesthesia Plan: General   Post-op Pain Management:    Induction: Intravenous  PONV Risk Score and Plan: 3 and Ondansetron and Treatment may vary due to age or medical condition  Airway Management Planned: Oral ETT  Additional Equipment: Arterial line, PA Cath, TEE and Ultrasound Guidance Line Placement  Intra-op Plan:   Post-operative Plan: Post-operative intubation/ventilation  Informed  Consent: I have reviewed the patients History and Physical, chart, labs and discussed the procedure including the risks, benefits and alternatives for the proposed anesthesia with the patient or authorized representative who has indicated his/her understanding and acceptance.     Dental advisory given  Plan Discussed with: CRNA and Surgeon  Anesthesia Plan Comments:        Anesthesia Quick Evaluation

## 2020-04-25 NOTE — Transfer of Care (Signed)
Immediate Anesthesia Transfer of Care Note  Patient: [name]  Procedure(s) Performed: CORONARY ARTERY BYPASS GRAFTING (CABG) TIMES TWO USING LEFT INTERNAL MAMMARY ARTERY AND RIGHT GREATER SAPHENOUS VEIN HARVESTED ENDOSCOPICALLY (N/A Chest) TRANSESOPHAGEAL ECHOCARDIOGRAM (TEE)  Patient Location: SICU  Anesthesia Type:General  Level of Consciousness: sedated and Patient remains intubated per anesthesia plan  Airway & Oxygen Therapy: Patient remains intubated per anesthesia plan and Patient placed on Ventilator (see vital sign flow sheet for setting)  Post-op Assessment: Report given to RN and Post -op Vital signs reviewed and stable  Post vital signs: Reviewed and stable  Last Vitals:  Vitals Value Taken Time  BP    Temp    Pulse    Resp    SpO2      Last Pain:  Vitals:   04/25/20 1125  TempSrc: Oral  PainSc:       Patients Stated Pain Goal: 1 (33/58/25 1898)  Complications: No complications documented.

## 2020-04-25 NOTE — Anesthesia Procedure Notes (Signed)
Arterial Line Insertion Start/End4/04/2020 12:10 PM, 04/25/2020 12:19 PM Performed by: Imagene Riches, CRNA, CRNA  Preanesthetic checklist: patient identified, IV checked, site marked, risks and benefits discussed, surgical consent, monitors and equipment checked, pre-op evaluation, timeout performed and anesthesia consent Patient sedated Left, radial was placed Catheter size: 20 G Hand hygiene performed , maximum sterile barriers used  and Seldinger technique used  Attempts: 1 Procedure performed without using ultrasound guided technique. Following insertion, dressing applied and Biopatch. Post procedure assessment: normal  Patient tolerated the procedure well with no immediate complications.

## 2020-04-26 ENCOUNTER — Encounter (HOSPITAL_COMMUNITY): Payer: Self-pay | Admitting: Thoracic Surgery (Cardiothoracic Vascular Surgery)

## 2020-04-26 ENCOUNTER — Inpatient Hospital Stay (HOSPITAL_COMMUNITY): Payer: Medicare HMO

## 2020-04-26 DIAGNOSIS — I5022 Chronic systolic (congestive) heart failure: Secondary | ICD-10-CM | POA: Diagnosis not present

## 2020-04-26 LAB — CBC
HCT: 33.1 % — ABNORMAL LOW (ref 36.0–46.0)
HCT: 29.5 % — ABNORMAL LOW (ref 36.0–46.0)
Hemoglobin: 11.3 g/dL — ABNORMAL LOW (ref 12.0–15.0)
Hemoglobin: 9.9 g/dL — ABNORMAL LOW (ref 12.0–15.0)
MCH: 28.9 pg (ref 26.0–34.0)
MCH: 29.4 pg (ref 26.0–34.0)
MCHC: 34.1 g/dL (ref 30.0–36.0)
MCHC: 33.6 g/dL (ref 30.0–36.0)
MCV: 84.7 fL (ref 80.0–100.0)
MCV: 87.5 fL (ref 80.0–100.0)
Platelets: 121 10*3/uL — ABNORMAL LOW (ref 150–400)
Platelets: 117 10*3/uL — ABNORMAL LOW (ref 150–400)
RBC: 3.91 MIL/uL (ref 3.87–5.11)
RBC: 3.37 MIL/uL — ABNORMAL LOW (ref 3.87–5.11)
RDW: 13.7 % (ref 11.5–15.5)
RDW: 14.4 % (ref 11.5–15.5)
WBC: 11.6 10*3/uL — ABNORMAL HIGH (ref 4.0–10.5)
WBC: 11 10*3/uL — ABNORMAL HIGH (ref 4.0–10.5)
nRBC: 0 % (ref 0.0–0.2)
nRBC: 0 % (ref 0.0–0.2)

## 2020-04-26 LAB — BASIC METABOLIC PANEL
Anion gap: 9 (ref 5–15)
Anion gap: 6 (ref 5–15)
BUN: 13 mg/dL (ref 8–23)
BUN: 13 mg/dL (ref 8–23)
CO2: 22 mmol/L (ref 22–32)
CO2: 25 mmol/L (ref 22–32)
Calcium: 8.7 mg/dL — ABNORMAL LOW (ref 8.9–10.3)
Calcium: 8.2 mg/dL — ABNORMAL LOW (ref 8.9–10.3)
Chloride: 105 mmol/L (ref 98–111)
Chloride: 106 mmol/L (ref 98–111)
Creatinine, Ser: 0.9 mg/dL (ref 0.44–1.00)
Creatinine, Ser: 1.06 mg/dL — ABNORMAL HIGH (ref 0.44–1.00)
GFR, Estimated: >60 mL/min (ref >60)
GFR, Estimated: 56 mL/min — ABNORMAL LOW (ref >60)
Glucose, Bld: 170 mg/dL — ABNORMAL HIGH (ref 70–99)
Glucose, Bld: 139 mg/dL — ABNORMAL HIGH (ref 70–99)
Potassium: 4.1 mmol/L (ref 3.5–5.1)
Potassium: 4.3 mmol/L (ref 3.5–5.1)
Sodium: 136 mmol/L (ref 135–145)
Sodium: 137 mmol/L (ref 135–145)

## 2020-04-26 LAB — POCT I-STAT 7, (LYTES, BLD GAS, ICA,H+H)
Acid-Base Excess: 0 mmol/L (ref 0.0–2.0)
Acid-base deficit: 1 mmol/L (ref 0.0–2.0)
Acid-base deficit: 2 mmol/L (ref 0.0–2.0)
Acid-base deficit: 3 mmol/L — ABNORMAL HIGH (ref 0.0–2.0)
Bicarbonate: 24.2 mmol/L (ref 20.0–28.0)
Bicarbonate: 24.4 mmol/L (ref 20.0–28.0)
Bicarbonate: 21.9 mmol/L (ref 20.0–28.0)
Bicarbonate: 22.5 mmol/L (ref 20.0–28.0)
Calcium, Ion: 1.2 mmol/L (ref 1.15–1.40)
Calcium, Ion: 1.23 mmol/L (ref 1.15–1.40)
Calcium, Ion: 1.2 mmol/L (ref 1.15–1.40)
Calcium, Ion: 1.21 mmol/L (ref 1.15–1.40)
HCT: 31 % — ABNORMAL LOW (ref 36.0–46.0)
HCT: 32 % — ABNORMAL LOW (ref 36.0–46.0)
HCT: 29 % — ABNORMAL LOW (ref 36.0–46.0)
HCT: 29 % — ABNORMAL LOW (ref 36.0–46.0)
Hemoglobin: 10.5 g/dL — ABNORMAL LOW (ref 12.0–15.0)
Hemoglobin: 10.9 g/dL — ABNORMAL LOW (ref 12.0–15.0)
Hemoglobin: 9.9 g/dL — ABNORMAL LOW (ref 12.0–15.0)
Hemoglobin: 9.9 g/dL — ABNORMAL LOW (ref 12.0–15.0)
O2 Saturation: 99 %
O2 Saturation: 99 %
O2 Saturation: 99 %
O2 Saturation: 99 %
Patient temperature: 37
Patient temperature: 37.1
Patient temperature: 37.4
Patient temperature: 37.5
Potassium: 4.1 mmol/L (ref 3.5–5.1)
Potassium: 4.2 mmol/L (ref 3.5–5.1)
Potassium: 4.3 mmol/L (ref 3.5–5.1)
Potassium: 4.8 mmol/L (ref 3.5–5.1)
Sodium: 140 mmol/L (ref 135–145)
Sodium: 140 mmol/L (ref 135–145)
Sodium: 141 mmol/L (ref 135–145)
Sodium: 141 mmol/L (ref 135–145)
TCO2: 25 mmol/L (ref 22–32)
TCO2: 26 mmol/L (ref 22–32)
TCO2: 23 mmol/L (ref 22–32)
TCO2: 24 mmol/L (ref 22–32)
pCO2 arterial: 39.8 mmHg (ref 32.0–48.0)
pCO2 arterial: 40.1 mmHg (ref 32.0–48.0)
pCO2 arterial: 37.3 mmHg (ref 32.0–48.0)
pCO2 arterial: 37.4 mmHg (ref 32.0–48.0)
pH, Arterial: 7.392 (ref 7.350–7.450)
pH, Arterial: 7.393 (ref 7.350–7.450)
pH, Arterial: 7.378 (ref 7.350–7.450)
pH, Arterial: 7.392 (ref 7.350–7.450)
pO2, Arterial: 119 mmHg — ABNORMAL HIGH (ref 83.0–108.0)
pO2, Arterial: 127 mmHg — ABNORMAL HIGH (ref 83.0–108.0)
pO2, Arterial: 119 mmHg — ABNORMAL HIGH (ref 83.0–108.0)
pO2, Arterial: 124 mmHg — ABNORMAL HIGH (ref 83.0–108.0)

## 2020-04-26 LAB — BPAM PLATELET PHERESIS
Blood Product Expiration Date: 202204042359
ISSUE DATE / TIME: 202204041524
Unit Type and Rh: 6200

## 2020-04-26 LAB — POCT I-STAT, CHEM 8
BUN: 12 mg/dL (ref 8–23)
Calcium, Ion: 1.19 mmol/L (ref 1.15–1.40)
Chloride: 105 mmol/L (ref 98–111)
Creatinine, Ser: 0.7 mg/dL (ref 0.44–1.00)
Glucose, Bld: 113 mg/dL — ABNORMAL HIGH (ref 70–99)
HCT: 24 % — ABNORMAL LOW (ref 36.0–46.0)
Hemoglobin: 8.2 g/dL — ABNORMAL LOW (ref 12.0–15.0)
Potassium: 5.8 mmol/L — ABNORMAL HIGH (ref 3.5–5.1)
Sodium: 138 mmol/L (ref 135–145)
TCO2: 25 mmol/L (ref 22–32)

## 2020-04-26 LAB — BPAM FFP
Blood Product Expiration Date: 202204082359
ISSUE DATE / TIME: 202204041635
Unit Type and Rh: 6200

## 2020-04-26 LAB — COOXEMETRY PANEL
Carboxyhemoglobin: 1.1 % (ref 0.5–1.5)
Methemoglobin: 1 % (ref 0.0–1.5)
O2 Saturation: 61.2 %
Total hemoglobin: 9.3 g/dL — ABNORMAL LOW (ref 12.0–16.0)

## 2020-04-26 LAB — GLUCOSE, CAPILLARY
Glucose-Capillary: 102 mg/dL — ABNORMAL HIGH (ref 70–99)
Glucose-Capillary: 108 mg/dL — ABNORMAL HIGH (ref 70–99)
Glucose-Capillary: 109 mg/dL — ABNORMAL HIGH (ref 70–99)
Glucose-Capillary: 114 mg/dL — ABNORMAL HIGH (ref 70–99)
Glucose-Capillary: 122 mg/dL — ABNORMAL HIGH (ref 70–99)
Glucose-Capillary: 123 mg/dL — ABNORMAL HIGH (ref 70–99)
Glucose-Capillary: 123 mg/dL — ABNORMAL HIGH (ref 70–99)
Glucose-Capillary: 124 mg/dL — ABNORMAL HIGH (ref 70–99)
Glucose-Capillary: 126 mg/dL — ABNORMAL HIGH (ref 70–99)
Glucose-Capillary: 133 mg/dL — ABNORMAL HIGH (ref 70–99)
Glucose-Capillary: 136 mg/dL — ABNORMAL HIGH (ref 70–99)
Glucose-Capillary: 156 mg/dL — ABNORMAL HIGH (ref 70–99)
Glucose-Capillary: 84 mg/dL (ref 70–99)

## 2020-04-26 LAB — PREPARE FRESH FROZEN PLASMA: Unit division: 0

## 2020-04-26 LAB — PREPARE PLATELET PHERESIS: Unit division: 0

## 2020-04-26 LAB — MAGNESIUM
Magnesium: 2.9 mg/dL — ABNORMAL HIGH (ref 1.7–2.4)
Magnesium: 2.2 mg/dL (ref 1.7–2.4)

## 2020-04-26 MED ORDER — INSULIN ASPART 100 UNIT/ML ~~LOC~~ SOLN
0.0000 [IU] | SUBCUTANEOUS | Status: DC
  Administered 2020-04-26 – 2020-04-27 (×3): 2 [IU] via SUBCUTANEOUS

## 2020-04-26 MED ORDER — INSULIN DETEMIR 100 UNIT/ML ~~LOC~~ SOLN
10.0000 [IU] | Freq: Two times a day (BID) | SUBCUTANEOUS | Status: DC
  Administered 2020-04-26 (×2): 10 [IU] via SUBCUTANEOUS
  Filled 2020-04-26 (×4): qty 0.1

## 2020-04-26 MED ORDER — LEVETIRACETAM 500 MG PO TABS
1500.0000 mg | ORAL_TABLET | Freq: Two times a day (BID) | ORAL | Status: DC
  Administered 2020-04-26 – 2020-05-05 (×19): 1500 mg via ORAL
  Filled 2020-04-26: qty 3
  Filled 2020-04-26 (×6): qty 6
  Filled 2020-04-26 (×2): qty 3
  Filled 2020-04-26: qty 6
  Filled 2020-04-26 (×6): qty 3
  Filled 2020-04-26 (×3): qty 6

## 2020-04-26 MED ORDER — ORAL CARE MOUTH RINSE
15.0000 mL | OROMUCOSAL | Status: DC
  Administered 2020-04-26 (×3): 15 mL via OROMUCOSAL

## 2020-04-26 MED ORDER — MILRINONE LACTATE IN DEXTROSE 20-5 MG/100ML-% IV SOLN: 0.2500 ug/kg/min | INTRAVENOUS | Status: DC

## 2020-04-26 MED ORDER — ENOXAPARIN SODIUM 40 MG/0.4ML ~~LOC~~ SOLN
40.0000 mg | Freq: Every day | SUBCUTANEOUS | Status: DC
  Administered 2020-04-26 – 2020-04-28 (×3): 40 mg via SUBCUTANEOUS
  Filled 2020-04-26 (×3): qty 0.4

## 2020-04-26 MED ORDER — ATORVASTATIN CALCIUM 80 MG PO TABS
80.0000 mg | ORAL_TABLET | Freq: Every day | ORAL | Status: DC
  Administered 2020-04-26 – 2020-05-05 (×10): 80 mg via ORAL
  Filled 2020-04-26: qty 1
  Filled 2020-04-26: qty 2
  Filled 2020-04-26 (×2): qty 1
  Filled 2020-04-26: qty 2
  Filled 2020-04-26 (×5): qty 1

## 2020-04-26 MED ORDER — LACOSAMIDE 50 MG PO TABS
100.0000 mg | ORAL_TABLET | Freq: Two times a day (BID) | ORAL | Status: DC
  Administered 2020-04-26 – 2020-05-05 (×20): 100 mg via ORAL
  Filled 2020-04-26 (×20): qty 2

## 2020-04-26 MED ORDER — CHLORHEXIDINE GLUCONATE 0.12% ORAL RINSE (MEDLINE KIT)
15.0000 mL | Freq: Two times a day (BID) | OROMUCOSAL | Status: DC
  Administered 2020-04-26: 15 mL via OROMUCOSAL

## 2020-04-26 MED ORDER — KETOROLAC TROMETHAMINE 15 MG/ML IJ SOLN
15.0000 mg | Freq: Once | INTRAMUSCULAR | Status: AC
  Administered 2020-04-26: 15 mg via INTRAVENOUS
  Filled 2020-04-26: qty 1

## 2020-04-26 MED ORDER — ORAL CARE MOUTH RINSE
15.0000 mL | Freq: Two times a day (BID) | OROMUCOSAL | Status: DC
  Administered 2020-04-26 – 2020-05-03 (×14): 15 mL via OROMUCOSAL

## 2020-04-26 MED ORDER — POTASSIUM CHLORIDE 20 MEQ PO PACK
40.0000 meq | PACK | Freq: Once | ORAL | Status: AC
  Administered 2020-04-26: 40 meq
  Filled 2020-04-26: qty 2

## 2020-04-26 MED ORDER — BACLOFEN 10 MG PO TABS
20.0000 mg | ORAL_TABLET | Freq: Two times a day (BID) | ORAL | Status: DC
  Administered 2020-04-26 – 2020-05-05 (×19): 20 mg via ORAL
  Filled 2020-04-26 (×3): qty 1
  Filled 2020-04-26: qty 2
  Filled 2020-04-26 (×3): qty 1
  Filled 2020-04-26 (×2): qty 2
  Filled 2020-04-26 (×2): qty 1
  Filled 2020-04-26 (×2): qty 2
  Filled 2020-04-26 (×2): qty 1
  Filled 2020-04-26: qty 2
  Filled 2020-04-26: qty 1
  Filled 2020-04-26 (×3): qty 2

## 2020-04-26 MED ORDER — FUROSEMIDE 10 MG/ML IJ SOLN
40.0000 mg | Freq: Two times a day (BID) | INTRAMUSCULAR | Status: DC
  Administered 2020-04-26 (×2): 40 mg via INTRAVENOUS
  Filled 2020-04-26 (×2): qty 4

## 2020-04-26 MED ORDER — NOREPINEPHRINE 4 MG/250ML-% IV SOLN
0.0000 ug/min | INTRAVENOUS | Status: DC
  Administered 2020-04-26: 2 ug/min via INTRAVENOUS
  Filled 2020-04-26: qty 250

## 2020-04-26 NOTE — Progress Notes (Signed)
On reattempt to follow exercises pt was still unable to. Paged Atkins at [place] to address this issue. Atkins wants to remains on full support until the AM for safety of the pt.

## 2020-04-26 NOTE — Progress Notes (Signed)
RT attempting to wean pt again, pt was not able to perform NIF or VC. Pt nods head when she is instucted on what to do however she is not able to perform the breathing exercises.

## 2020-04-26 NOTE — Progress Notes (Signed)
RT attempted to wean pt at 2200, pt RR 4-9 times a minute and minute volume was 2.8L. Pt placed back on full support.

## 2020-04-26 NOTE — Progress Notes (Addendum)
1 Day Post-Op Procedure(s) (LRB): CORONARY ARTERY BYPASS GRAFTING (CABG) TIMES TWO USING LEFT INTERNAL MAMMARY ARTERY AND RIGHT GREATER SAPHENOUS VEIN HARVESTED ENDOSCOPICALLY (N/A) TRANSESOPHAGEAL ECHOCARDIOGRAM (TEE) Subjective: Still intubated, alert and following commands  Objective: Vital signs in last 24 hours: Temp:  [93.9 F (34.4 C)-99.3 F (37.4 C)] 99.3 F (37.4 C) (04/05 0630) Pulse Rate:  [60-91] 87 (04/05 0630) Cardiac Rhythm: A-V Sequential paced (04/05 0400) Resp:  [9-26] 12 (04/05 0413) BP: (99-129)/(55-72) 128/62 (04/04 2000) SpO2:  [95 %-100 %] 100 % (04/05 0630) Arterial Line BP: (85-173)/(41-74) 109/46 (04/05 0630) FiO2 (%):  [40 %-50 %] 40 % (04/05 0643) Weight:  [61.4 kg] 61.4 kg (04/05 0500)  Hemodynamic parameters for last 24 hours: PAP: (20-31)/(-1-18) 31/18  Intake/Output from previous day: 04/04 0701 - 04/05 0700 In: 4211 [I.V.:2539.6; Blood:870; IV Piggyback:801.4] Out: 7048 [Urine:4035; Chest Tube:340] Intake/Output this shift: No intake/output data recorded.  General appearance: alert, cooperative and no distress Neurologic: right sided weakness and no change Heart: regular rate and rhythm and + rub Lungs: clear to auscultation bilaterally Abdomen: normal findings: soft, non-tender  Lab Results: Recent Labs    04/25/20 1751 04/25/20 1754 04/26/20 0105 04/26/20 0117  WBC 13.3*  --   --  11.6*  HGB 11.5*   < > 10.9* 11.3*  HCT 34.1*   < > 32.0* 33.1*  PLT 106*  --   --  121*   < > = values in this interval not displayed.   BMET:  Recent Labs    04/25/20 2044 04/26/20 0014 04/26/20 0105 04/26/20 0117  NA 136   < > 140 136  K 3.7   < > 4.2 4.1  CL 107  --   --  105  CO2 22  --   --  22  GLUCOSE 110*  --   --  170*  BUN 12  --   --  13  CREATININE 0.78  --   --  0.90  CALCIUM 8.5*  --   --  8.7*   < > = values in this interval not displayed.    PT/INR:  Recent Labs    04/25/20 1751  LABPROT 19.0*  INR 1.7*   ABG     Component Value Date/Time   PHART 7.392 04/26/2020 0105   HCO3 24.4 04/26/2020 0105   TCO2 26 04/26/2020 0105   ACIDBASEDEF 1.0 04/26/2020 0014   O2SAT 99.0 04/26/2020 0105   CBG (last 3)  Recent Labs    04/26/20 0527 04/26/20 0627 04/26/20 0732  GLUCAP 102* 123* 108*    Assessment/Plan: S/P Procedure(s) (LRB): CORONARY ARTERY BYPASS GRAFTING (CABG) TIMES TWO USING LEFT INTERNAL MAMMARY ARTERY AND RIGHT GREATER SAPHENOUS VEIN HARVESTED ENDOSCOPICALLY (N/A) TRANSESOPHAGEAL ECHOCARDIOGRAM (TEE) POD # 1 NEURO- alert, no new deficit CV- good hemodynamics on milrinone  Dc A line, follow co-ox  In CHB with slow escape under pacer- may need PPM RESP- intubated because of parameters- extubate this AM RENAL- creatinine normal, lytes OK ENDO-CBG well controlled  Transition off insulin drip GI- advance diet once extubated Anemia secondary to ABL_ Hct 33 SCD + enoxaparin for DVT prophylaxis DC chest tubes PT/OT consults   LOS: 5 days    [name] 04/26/2020

## 2020-04-26 NOTE — Progress Notes (Signed)
Attempt #2 vent wean failed. Pt met all parameters to wean but when coached on NIF and VTe exercises the patient does not breathe in or out appropriately. Patient nods head to understanding of the exercise but when it is time to perform she is unable to do so. Flipped back to the first part of wean at [place] and will reattempt exercises.

## 2020-04-26 NOTE — Procedures (Signed)
Extubation Procedure Note  Patient Details:   Name: Jayla Mackie [place] DOB: 01-19-1951 MRN: [id number]   Airway Documentation:    Vent end date: 04/26/20 Vent end time: 0820   Evaluation  O2 sats: stable throughout Complications: No apparent complications Patient did tolerate procedure well. Bilateral Breath Sounds: Clear   Yes   Patient was extubated to a 4L [place] without any complications, dyspnea or stridor noted. NIF & VC were not preformed due to patient's inability to do them. Dr. Roxan Hockey aware and gave verbal orders to extubate without parameters. Positive cuff leak prior to extubation.   Sandy Blouch, Eddie [place] 04/26/2020, 8:20 AM

## 2020-04-26 NOTE — Progress Notes (Signed)
      [address]       [place] [address]             [phone]      Up in chair Resting comfortably BP (!) 94/59   Pulse (!) 102   Temp 100.22 F (37.9 C) (Bladder)   Resp 14   Ht 5\' 7"  (1.702 m)   Wt 61.4 kg   SpO2 (!) 81%   BMI 21.20 kg/m  4L [place] 99%  Intake/Output Summary (Last 24 hours) at 04/26/2020 1828 Last data filed at 04/26/2020 1759 Gross per 24 hour  Intake 1669.47 ml  Output 2905 ml  Net -1235.53 ml    K= 4.3, creatinine 1.06 Hct= 30 PLT 117K  Doing well POD # 1  Monroe Qin C. Roxan Hockey, MD [facility] [phone]

## 2020-04-26 NOTE — Progress Notes (Signed)
Placed pt back on full support.

## 2020-04-26 NOTE — Progress Notes (Addendum)
Patient ID: [name], female   DOB: [birth year], 70 y.o.   MRN: [id number]     Advanced Heart Failure Rounding Note  PCP-Cardiologist: Skeet Latch, MD  AHF: Dr. Aundra Dubin   Subjective:   - Admitted for CHB.  - Echo with EF 35-40%, inferior and lateral HK, normal RV.  - Coronary angiography: 60-70% dLM, 60-70% ostial LCx, 99% proximal calcified LCx (large/codominant LCx) - 4/4  S/p CABG x 4 (LIMA-LAD, SVG-OM2)  No events overnight. Awake on vent.   On Neo 20 + milrinone 0.25. Co-ox pending. CVP 12. Up 16 lb above pre-op wt.   Flo Track  CO 4.4, CI 2.7, MAP 75   AV paced 80s    Objective:   Weight Range: 61.4 kg Body mass index is 21.2 kg/m.   Vital Signs:   Temp:  [93.9 F (34.4 C)-99.3 F (37.4 C)] 99.3 F (37.4 C) (04/05 0630) Pulse Rate:  [60-91] 87 (04/05 0630) Resp:  [9-26] 12 (04/05 0413) BP: (99-129)/(55-72) 128/62 (04/04 2000) SpO2:  [95 %-100 %] 100 % (04/05 0630) Arterial Line BP: (85-173)/(41-74) 109/46 (04/05 0630) FiO2 (%):  [40 %-50 %] 40 % (04/05 0643) Weight:  [61.4 kg] 61.4 kg (04/05 0500) Last BM Date: 04/19/20  Weight change: Filed Weights   04/23/20 0500 04/25/20 0500 04/26/20 0500  Weight: 55.2 kg 54.3 kg 61.4 kg    Intake/Output:   Intake/Output Summary (Last 24 hours) at 04/26/2020 0716 Last data filed at 04/26/2020 0600 Gross per 24 hour  Intake 4178.68 ml  Output 4335 ml  Net -156.32 ml      Physical Exam    PHYSICAL EXAM: CVP 12-13  General:  Well appearing. Awake on vent HEENT: normal + ETT Neck: supple. JVD 12 cm. Carotids 2+ bilat; no bruits. No lymphadenopathy or thyromegaly appreciated. Cor: PMI nondisplaced. Regular rate & rhythm. No rubs, gallops or murmurs. + sternal surgical dressing + CTs Lungs:  Intubated, decreased BS at the bases Abdomen: soft, nontender, nondistended. No hepatosplenomegaly. No bruits or masses. Good bowel sounds. Extremities: no cyanosis, clubbing, rash, edema Neuro: awake on vent      Telemetry   A-V Paced 80s  (personally reviewed)  Labs    CBC Recent Labs    04/25/20 1751 04/25/20 1754 04/26/20 0105 04/26/20 0117  WBC 13.3*  --   --  11.6*  HGB 11.5*   < > 10.9* 11.3*  HCT 34.1*   < > 32.0* 33.1*  MCV 85.9  --   --  84.7  PLT 106*  --   --  121*   < > = values in this interval not displayed.   Basic Metabolic Panel Recent Labs    04/25/20 2044 04/26/20 0014 04/26/20 0105 04/26/20 0117  NA 136   < > 140 136  K 3.7   < > 4.2 4.1  CL 107  --   --  105  CO2 22  --   --  22  GLUCOSE 110*  --   --  170*  BUN 12  --   --  13  CREATININE 0.78  --   --  0.90  CALCIUM 8.5*  --   --  8.7*  MG 2.7*  --   --  2.9*   < > = values in this interval not displayed.   Liver Function Tests No results for input(s): AST, ALT, ALKPHOS, BILITOT, PROT, ALBUMIN in the last 72 hours. No results for input(s): LIPASE, AMYLASE in the last 72 hours.  Cardiac Enzymes No results for input(s): CKTOTAL, CKMB, CKMBINDEX, TROPONINI in the last 72 hours.  BNP: BNP (last 3 results) No results for input(s): BNP in the last 8760 hours.  ProBNP (last 3 results) No results for input(s): PROBNP in the last 8760 hours.   D-Dimer No results for input(s): DDIMER in the last 72 hours. Hemoglobin A1C No results for input(s): HGBA1C in the last 72 hours. Fasting Lipid Panel No results for input(s): CHOL, HDL, LDLCALC, TRIG, CHOLHDL, LDLDIRECT in the last 72 hours. Thyroid Function Tests No results for input(s): TSH, T4TOTAL, T3FREE, THYROIDAB in the last 72 hours.  Invalid input(s): FREET3  Other results:   Imaging    DG Chest Port 1 View  Result Date: 04/26/2020 CLINICAL DATA:  Open-heart surgery. EXAM: PORTABLE CHEST 1 VIEW COMPARISON:  04/25/2020. FINDINGS: Endotracheal tube 14 mm above the carina. NG tube, right IJ sheath, mediastinal drainage catheter, left chest tube in stable position. Prior CABG. Heart size stable. Low lung volumes with bibasilar atelectasis.  Bilateral mild interstitial prominence. A component of CHF cannot be excluded. Small bilateral pleural effusions noted. No pneumothorax. Peripheral vascular calcification. Mild thoracic spine scoliosis. IMPRESSION: 1.  Endotracheal tube 14 mm above the carina. 2. NG tube, right IJ sheath, mediastinal drainage catheter, left chest tube in stable position. No pneumothorax. 3. Prior CABG. Heart size stable. Mild bilateral interstitial prominence noted on today's exam suggesting interstitial edema. Small bilateral pleural effusions noted. Findings suggest mild CHF. Electronically Signed   By: Marcello Moores  Register   On: 04/26/2020 06:00   DG Chest Port 1 View  Result Date: 04/25/2020 CLINICAL DATA:  Post CABG EXAM: PORTABLE CHEST 1 VIEW COMPARISON:  04/21/2020 FINDINGS: Changes of CABG. Left chest tube in place. No pneumothorax. Endotracheal tube tip is less than 1 cm above the carina. NG tube is in the stomach. Right central line tip in the SVC. No pneumothorax. Heart is normal size. Mild vascular congestion and bibasilar atelectasis. IMPRESSION: Postoperative changes. Endotracheal tube just above the carina by 8 mm. Vascular congestion, bibasilar atelectasis. Electronically Signed   By: Rolm Baptise M.D.   On: 04/25/2020 18:20   ECHO INTRAOPERATIVE TEE  Result Date: 04/25/2020  *INTRAOPERATIVE TRANSESOPHAGEAL REPORT *  Patient Name:   [name] Date of Exam: 04/25/2020 Medical Rec #:  [id number]            Height:       67.0 in Accession #:    [id number]           Weight:       119.7 lb Date of Birth:  [birth year]           BSA:          1.63 m Patient Age:    70 years             BP:           113/55 mmHg Patient Gender: F                    HR:           76 bpm. Exam Location:  Anesthesiology Transesophogeal exam was perform intraoperatively during surgical procedure. Patient was closely monitored under general anesthesia during the entirety of examination. Indications:     Coronary Artery Disease  Sonographer:     Bernadene Person RDCS Performing Phys: [place] Diagnosing Phys: Annye Asa MD PROCEDURE: Intraoperative Transesophogeal Teeth intact after easy TEE probe insertion. Complications: No  known complications during this procedure. POST-OP IMPRESSIONS Limited Post CPB exam: The patient separated from CPB easily with low MIlrinone support. - Left Ventricle: The overall left ventricular is essentially unchanged from pre-bypass images. There is some interval improvement in the basal region, and also in that infero-septal wall. Overall EF 45-50%. - Right Ventricle: The right ventricular function appears unchanged from pre-bypass images. - Aortic Valve: The aortic valve function appears unchanged from pre-bypass images. Mild, anterior directed AI is present. - Mitral Valve: The mitral valve function appears unchanged from pre-bypass images. - Tricuspid Valve: The tricuspid valve function appears unchanged from pre-bypass images. PRE-OP FINDINGS  Left Ventricle: The left ventricle has mild-moderately reduced systolic function, with an ejection fraction of 40-45%, measured 40%. The cavity size was mildly dilated. Left ventricular has diffuse hypokinesis, with Severe hypokinesis of the Basal inferoseptal wall and entire basal region. There is no left ventricular hypertrophy. Left ventricular diastolic function was not evaluated. Right Ventricle: The right ventricle has normal systolic function. The cavity was normal. There is no increase in right ventricular wall thickness. Left Atrium: Left atrial size was normal in size. No left atrial/left atrial appendage thrombus was detected. Left atrial appendage velocity is normal at greater than 40 cm/s. Right Atrium: Right atrial size was normal in size. Catheter present traversing through the right atrium. Interatrial Septum: No atrial level shunt detected by color flow Doppler. There is left bowing of the interatrial septum, suggestive of  elevated right atrial pressure. There is no evidence of a patent foramen ovale by color doppler interrogation. Pericardium: There is no evidence of pericardial effusion. Mitral Valve: The mitral valve is normal in structure. Mitral valve regurgitation is trivial by color flow Doppler. There is no evidence of mitral valve vegetation. There is no evidence of mitral stenosis, with peak gradient 3 mmHg, mean gradient 1 mmHg. Tricuspid Valve: The tricuspid valve was normal in structure. Tricuspid valve regurgitation is trivial by color flow Doppler. No evidence of tricuspid stenosis is present. There is no evidence of tricuspid valve vegetation. Aortic Valve: The aortic valve is tricuspid. Aortic valve regurgitation is mild by color flow Doppler. The jet is anteriorly-directed toward the anterior leaflet of the mitral valve. There is no stenosis of the aortic valve, with peak gradient 8 mmHg, mean gradient 4 mmHg. There is no evidence of aortic valve vegetation. Pulmonic Valve: The pulmonic valve was normal in structure, with normal leaflet excursion. No evidence of pulmonic stenosis. Pulmonic valve regurgitation is trivial, around the PA catheter, by color flow Doppler. Aorta: There is evidence of calcification at the Sinotubular ridge and in the proximal ascending aorta. There is scattered moderate plaque in the aortic arch and descending aortic.Marland Kitchen Pulmonary Artery: Gordy Councilman catheter present on the right. The pulmonary artery is of normal size. Venous: The inferior vena cava is normal in size with less than 50% respiratory variability, suggesting right atrial pressure of 8 mmHg. Shunts: There is no evidence of an atrial septal defect. +-------------+--------++ AORTIC VALVE          +-------------+--------++ AV Mean Grad:4.0 mmHg +-------------+--------++ +-------------+--------++ MITRAL VALVE          +-------------+--------++ MV Mean grad:1.0 mmHg +-------------+--------++  Annye Asa MD  Electronically signed by Annye Asa MD Signature Date/Time: 04/25/2020/5:27:32 PM    Final    VAS US DOPPLER PRE CABG  Result Date: 04/25/2020 PREOPERATIVE VASCULAR EVALUATION  Indications:      Pre-CABG. Risk Factors:     Hypertension, hyperlipidemia, coronary artery disease,  prior                   CVA with residual right side neglect and aphasia. Other Factors:    Sickle Cell Disease, CHF, seizure disorder. Limitations:      Right arm is contracted Comparison Study: No prior studies Performing Technologist: Sharion Dove RVS  Examination Guidelines: A complete evaluation includes B-mode imaging, spectral Doppler, color Doppler, and power Doppler as needed of all accessible portions of each vessel. Bilateral testing is considered an integral part of a complete examination. Limited examinations for reoccurring indications may be performed as noted.  Right Carotid Findings: +----------+--------+--------+--------+------------+--------+           PSV cm/sEDV cm/sStenosisDescribe    Comments +----------+--------+--------+--------+------------+--------+ CCA Prox  85      14                                   +----------+--------+--------+--------+------------+--------+ CCA Distal75      20              heterogenous         +----------+--------+--------+--------+------------+--------+ ICA Prox  96      17      1-39%   heterogenous         +----------+--------+--------+--------+------------+--------+ ICA Distal91      28                                   +----------+--------+--------+--------+------------+--------+ ECA       80      11                                   +----------+--------+--------+--------+------------+--------+ Portions of this table do not appear on this page. +----------+--------+-------+--------+------------+           PSV cm/sEDV cmsDescribeArm Pressure +----------+--------+-------+--------+------------+ Subclavian95                                   +----------+--------+-------+--------+------------+ +---------+--------+--+--------+--+ VertebralPSV cm/s75EDV cm/s12 +---------+--------+--+--------+--+ Left Carotid Findings: +----------+--------+--------+--------+----------------------+---------+           PSV cm/sEDV cm/sStenosisDescribe              Comments  +----------+--------+--------+--------+----------------------+---------+ CCA Prox  79      12              heterogenous                    +----------+--------+--------+--------+----------------------+---------+ CCA Distal80      9               irregular and calcific          +----------+--------+--------+--------+----------------------+---------+ ICA Prox  124     21      1-39%   heterogenous          Shadowing +----------+--------+--------+--------+----------------------+---------+ ICA Distal85      22                                              +----------+--------+--------+--------+----------------------+---------+ ECA       87      15                                              +----------+--------+--------+--------+----------------------+---------+ +----------+--------+--------+--------+------------+  SubclavianPSV cm/sEDV cm/sDescribeArm Pressure +----------+--------+--------+--------+------------+           90      1                            +----------+--------+--------+--------+------------+ +---------+--------+--+--------+--+ VertebralPSV cm/s90EDV cm/s13 +---------+--------+--+--------+--+  ABI Findings: +--------+------------------+-----+------------------+--------------+ Right   Rt Pressure (mmHg)IndexWaveform          Comment        +--------+------------------+-----+------------------+--------------+ [id number]                                      contracted arm +--------+------------------+-----+------------------+--------------+ PTA     254               2.07 monophasic                        +--------+------------------+-----+------------------+--------------+ DP                             unable to insonate               +--------+------------------+-----+------------------+--------------+ +--------+------------------+-----+-----------+-------+ Left    Lt Pressure (mmHg)IndexWaveform   Comment +--------+------------------+-----+-----------+-------+ [id number]                    multiphasic        +--------+------------------+-----+-----------+-------+ PTA     112               0.91 monophasic         +--------+------------------+-----+-----------+-------+ DP      64                0.52 monophasic         +--------+------------------+-----+-----------+-------+ +-------+---------------+----------------+ ABI/TBIToday's ABI/TBIPrevious ABI/TBI +-------+---------------+----------------+ Right  2.07                            +-------+---------------+----------------+ Left   0.91                            +-------+---------------+----------------+  Right Doppler Findings: +-----------+--------+-----+-------+--------------+ Site       PressureIndexDopplerComments       +-----------+--------+-----+-------+--------------+ Brachial   123                 contracted arm +-----------+--------+-----+-------+--------------+ Radial                         contracted arm +-----------+--------+-----+-------+--------------+ Ulnar                          contracted arm +-----------+--------+-----+-------+--------------+ Palmar Arch                    contracted arm +-----------+--------+-----+-------+--------------+  Left Doppler Findings: +--------+--------+-----+-----------+--------+ Site    PressureIndexDoppler    Comments +--------+--------+-----+-----------+--------+ [id number]          multiphasic         +--------+--------+-----+-----------+--------+ Radial               multiphasic          +--------+--------+-----+-----------+--------+ Ulnar                multiphasic         +--------+--------+-----+-----------+--------+  Summary: Right Carotid: Velocities in the right ICA are consistent with a 1-39% stenosis. Left Carotid: Velocities in the left ICA are consistent with a 1-39% stenosis. Vertebrals:  Bilateral vertebral arteries demonstrate antegrade flow. Subclavians: Normal flow hemodynamics were seen in bilateral subclavian              arteries. Right ABI: Resting right ankle-brachial index indicates noncompressible right lower extremity arteries. Left ABI: Resting left ankle-brachial index indicates mild left lower extremity arterial disease. Right Upper Extremity: Patient has contracted arm, unable to perform testing Left Upper Extremity: Doppler waveforms decrease <50% w left radial compression. Doppler waveform obliterate with left ulnar compression.  Electronically signed by Ruta Hinds MD on 04/25/2020 at 3:02:47 PM.    Final      Medications:     Scheduled Medications: . sodium chloride   Intravenous Once  . acetaminophen  1,000 mg Oral Q6H   Or  . acetaminophen (TYLENOL) oral liquid 160 mg/5 mL  1,000 mg Per Tube Q6H  . aspirin EC  325 mg Oral Daily   Or  . aspirin  324 mg Per Tube Daily  . atorvastatin  80 mg Per Tube QHS  . baclofen  20 mg Per Tube BID  . bisacodyl  10 mg Oral Daily   Or  . bisacodyl  10 mg Rectal Daily  . chlorhexidine gluconate (MEDLINE KIT)  15 mL Mouth Rinse BID  . Chlorhexidine Gluconate Cloth  6 each Topical Daily  . Chlorhexidine Gluconate Cloth  6 each Topical Daily  . docusate sodium  200 mg Oral Daily  . lacosamide  100 mg Per Tube BID  . levETIRAcetam  1,500 mg Per Tube BID  . mouth rinse  15 mL Mouth Rinse 10 times per day  . metoprolol tartrate  12.5 mg Oral BID   Or  . metoprolol tartrate  12.5 mg Per Tube BID  . [START ON 04/27/2020] pantoprazole  40 mg Oral Daily  . sodium chloride flush  3 mL Intravenous Q12H     Infusions: . sodium chloride    . sodium chloride    . sodium chloride    . albumin human 12.5 g (04/25/20 2027)  . cefUROXime (ZINACEF)  IV Stopped (04/26/20 0006)  . dexmedetomidine (PRECEDEX) IV infusion 0.1 mcg/kg/hr (04/26/20 0600)  . insulin 0.3 mL/hr at 04/26/20 0600  . lactated ringers    . lactated ringers 20 mL/hr at 04/26/20 0600  . milrinone 0.25 mcg/kg/min (04/26/20 0600)  . nitroGLYCERIN Stopped (04/26/20 0440)  . phenylephrine (NEO-SYNEPHRINE) Adult infusion 10 mcg/min (04/25/20 1835)    PRN Medications: sodium chloride, albumin human, dextrose, metoprolol tartrate, midazolam, morphine injection, ondansetron (ZOFRAN) IV, oxyCODONE, polyethylene glycol, sodium chloride flush, traMADol   Assessment/Plan   1. Complete heart block: Patient had baseline NSR with LAFB/RBBB, so there was underlying conduction system disease.  Suspect this progressed to CHB with beta blocker/digoxin use + chronic ischemia in territory of large  co-dominant LCx.  Off nodal blockers, she remains in NSR. - Off nodal blockers.  - May be able to avoid long-term PPM with revascularization.  - now s/p CABG  - monitor on tele  2. Chronic systolic CHF: Most recent prior echo in 8/20 with EF 35-40% and wall motion abnormalities.  Ischemic cardiomyopathy with left main/severe codominant LCx disease.  Echo this admission with stable EF 35-40%, inferior and lateral hypokinesis.  Not significantly volume overloaded on exam. Per her husband, at baseline she is aphasic but can walk around the  house and do her ADLs. Now s/p CABG 4/4 - on NEO 20 + milrinone 0.25  - CO/ CI ok through [employer]. Co-ox pending  - CVP 12-13, up 16 lb above pre-op wt - Diurese w/ IV Lasix - Hold digoxin and Coreg with CHB.  - Hold Entresto with soft BP.  3. 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  Has been on warfarin.  Has had long-standing aphasia and right hemiparesis.  Can walk around house and do ADLs.  - Warfarin on  hold. Resume anticoagulation once ok w/ CT surgery   4. H/o seizure disorder: Continue Keppra.  5. ?Sickle cell anemia: Carries this diagnosis but not sure truly present.  6. CAD:  LHC showed 60-70% distal left main stenosis, heavily calcified proximal LCx with 60-70% stenosis at the ostium and discrete 99% stenosis in the proximal LCx.  I suspect that her CHB is related to chronic ischemia from LM-LCx disease worsened by nodal blockade.  Difficult situation.  She has surgical disease but will be more difficult to rehab after CABG with weakness from prior stroke though per her husband, she does all ADLs and walks with cane at home.  PCI would be possible but high risk/difficult heavy calcification and tortuousity. Had CABG x 2 4/4 w/ LIMA-LAD, SVG-OM2  - Continue ASA 81, statin.   Length of Stay: [address], PA-C  04/26/2020, 7:16 AM  Advanced Heart Failure Team Pager [phone] (M-F; 7a - 5p)  Please contact [facility] for night-coverage after hours (5p -7a ) and weekends on [email or web address]  Patient seen with PA, agree with the above note.   She is doing well post-CABG, still intubated but awake and following commands.   CI 2.8 by Flowtrack.  CVP 12-13, weight up.  She is on phenylephrine 20, milrinone 0.25.   General: NAD Neck: JVP 10-12, no thyromegaly or thyroid nodule.  Lungs: Clear to auscultation bilaterally with normal respiratory effort. CV: Nondisplaced PMI.  Heart regular S1/S2, no S3/S4, no murmur.  No peripheral edema.   Abdomen: Soft, nontender, no hepatosplenomegaly, no distention.  Skin: Intact without lesions or rashes.  Neurologic:Awake on vent Extremities: No clubbing or cyanosis.  HEENT: Normal.   Stop phenylephrine, if she still needs low dose pressor would use norepinephrine.  Start gentle diuresis today with Lasix 40 mg IV bid.  Looks like she should be able to extubate today.  Continue current milrinone for now.   ASA + statin.   Currently A-V  sequentially pacing. Dependent on pacing when checked today, will follow but may need PPM.   CRITICAL CARE Performed by: Loralie Champagne  Total critical care time: 35 minutes  Critical care time was exclusive of separately billable procedures and treating other patients.  Critical care was necessary to treat or prevent imminent or life-threatening deterioration.  Critical care was time spent personally by me on the following activities: development of treatment plan with patient and/or surrogate as well as nursing, discussions with consultants, evaluation of patient's response to treatment, examination of patient, obtaining history from patient or surrogate, ordering and performing treatments and interventions, ordering and review of laboratory studies, ordering and review of radiographic studies, pulse oximetry and re-evaluation of patient's condition.  Loralie Champagne 04/26/2020 7:46 AM

## 2020-04-27 ENCOUNTER — Inpatient Hospital Stay (HOSPITAL_COMMUNITY): Payer: Medicare HMO

## 2020-04-27 DIAGNOSIS — I5022 Chronic systolic (congestive) heart failure: Secondary | ICD-10-CM | POA: Diagnosis not present

## 2020-04-27 DIAGNOSIS — I442 Atrioventricular block, complete: Secondary | ICD-10-CM | POA: Diagnosis not present

## 2020-04-27 LAB — GLUCOSE, CAPILLARY
Glucose-Capillary: 101 mg/dL — ABNORMAL HIGH (ref 70–99)
Glucose-Capillary: 108 mg/dL — ABNORMAL HIGH (ref 70–99)
Glucose-Capillary: 116 mg/dL — ABNORMAL HIGH (ref 70–99)
Glucose-Capillary: 134 mg/dL — ABNORMAL HIGH (ref 70–99)
Glucose-Capillary: 64 mg/dL — ABNORMAL LOW (ref 70–99)
Glucose-Capillary: 78 mg/dL (ref 70–99)
Glucose-Capillary: 95 mg/dL (ref 70–99)

## 2020-04-27 LAB — BASIC METABOLIC PANEL
Anion gap: 5 (ref 5–15)
BUN: 14 mg/dL (ref 8–23)
CO2: 26 mmol/L (ref 22–32)
Calcium: 8 mg/dL — ABNORMAL LOW (ref 8.9–10.3)
Chloride: 106 mmol/L (ref 98–111)
Creatinine, Ser: 0.86 mg/dL (ref 0.44–1.00)
GFR, Estimated: >60 mL/min (ref >60)
Glucose, Bld: 79 mg/dL (ref 70–99)
Potassium: 3.5 mmol/L (ref 3.5–5.1)
Sodium: 137 mmol/L (ref 135–145)

## 2020-04-27 LAB — CBC
HCT: 28.4 % — ABNORMAL LOW (ref 36.0–46.0)
Hemoglobin: 9.6 g/dL — ABNORMAL LOW (ref 12.0–15.0)
MCH: 29.1 pg (ref 26.0–34.0)
MCHC: 33.8 g/dL (ref 30.0–36.0)
MCV: 86.1 fL (ref 80.0–100.0)
Platelets: 120 10*3/uL — ABNORMAL LOW (ref 150–400)
RBC: 3.3 MIL/uL — ABNORMAL LOW (ref 3.87–5.11)
RDW: 14.5 % (ref 11.5–15.5)
WBC: 13.8 10*3/uL — ABNORMAL HIGH (ref 4.0–10.5)
nRBC: 0 % (ref 0.0–0.2)

## 2020-04-27 LAB — COOXEMETRY PANEL
Carboxyhemoglobin: 1.4 % (ref 0.5–1.5)
Methemoglobin: 1.1 % (ref 0.0–1.5)
O2 Saturation: 68.9 %
Total hemoglobin: 10.1 g/dL — ABNORMAL LOW (ref 12.0–16.0)

## 2020-04-27 MED ORDER — POTASSIUM CHLORIDE 10 MEQ/50ML IV SOLN: 10.0000 meq | INTRAVENOUS | Status: DC

## 2020-04-27 MED ORDER — POTASSIUM CHLORIDE CRYS ER 20 MEQ PO TBCR
40.0000 meq | EXTENDED_RELEASE_TABLET | Freq: Once | ORAL | Status: AC
  Administered 2020-04-27: 40 meq via ORAL
  Filled 2020-04-27: qty 2

## 2020-04-27 MED ORDER — POTASSIUM CHLORIDE CRYS ER 20 MEQ PO TBCR: 20.0000 meq | EXTENDED_RELEASE_TABLET | Freq: Once | ORAL | Status: DC

## 2020-04-27 MED ORDER — POTASSIUM CHLORIDE 10 MEQ/50ML IV SOLN
10.0000 meq | INTRAVENOUS | Status: AC
  Administered 2020-04-27 (×2): 10 meq via INTRAVENOUS
  Filled 2020-04-27 (×2): qty 50

## 2020-04-27 MED ORDER — FUROSEMIDE 10 MG/ML IJ SOLN
40.0000 mg | Freq: Two times a day (BID) | INTRAMUSCULAR | Status: DC
  Administered 2020-04-27 – 2020-04-28 (×3): 40 mg via INTRAVENOUS
  Filled 2020-04-27 (×3): qty 4

## 2020-04-27 MED ORDER — INSULIN ASPART 100 UNIT/ML ~~LOC~~ SOLN
0.0000 [IU] | Freq: Three times a day (TID) | SUBCUTANEOUS | Status: DC
  Administered 2020-04-28: 2 [IU] via SUBCUTANEOUS
  Administered 2020-04-30 – 2020-05-02 (×3): 1 [IU] via SUBCUTANEOUS

## 2020-04-27 MED ORDER — POTASSIUM CHLORIDE 10 MEQ/50ML IV SOLN
10.0000 meq | INTRAVENOUS | Status: AC
  Administered 2020-04-27 (×3): 10 meq via INTRAVENOUS
  Filled 2020-04-27 (×3): qty 50

## 2020-04-27 MED ORDER — FUROSEMIDE 10 MG/ML IJ SOLN: 40.0000 mg | Freq: Every day | INTRAMUSCULAR | Status: DC

## 2020-04-27 MED ORDER — MILRINONE LACTATE IN DEXTROSE 20-5 MG/100ML-% IV SOLN
0.1250 ug/kg/min | INTRAVENOUS | Status: DC
  Administered 2020-04-27: 0.125 ug/kg/min via INTRAVENOUS
  Filled 2020-04-27: qty 100

## 2020-04-27 NOTE — Progress Notes (Signed)
Assisted tele visit to patient with family member.  Arely Tinner Anderson, RN   

## 2020-04-27 NOTE — Evaluation (Signed)
Occupational Therapy Evaluation Patient Details Name: [name] MRN: [id number] DOB: October 28, 1950 Today's Date: 04/27/2020    History of Present Illness 70 y.o. female with a hx of HTN, HLD, stroke (expessive aphasia and R sided deficit), sickle cell anemia, seizure d/o, chronic HF (systolic).  Underwent CORONARY ARTERY BYPASS GRAFTING (CABG)x2.   Clinical Impression   Patient admitted for the diagnosis and procedure above.  Spouse will need to be contact to verify DME and home setup, and prior level of function.  Barriers are listed below.  Sternal precautions will need to be reinforced, and she relies on her L UE to push and herself given baseline dysfunction.  Spouse will need to be contacted as to his expectations for discharge planning: home with [facility] vs SNF for rehab.  Currently, she is needing up to max A for squat pivot transfers, and up to Max A for ADL completion.  OT will continue to follow her in the acute setting to maximize her functional status.      Follow Up Recommendations  SNF    Equipment Recommendations  Other (comment) (need to contact spouse for DME owned)    Recommendations for Other Services       Precautions / Restrictions Precautions Precautions: Sternal Precaution Booklet Issued: No Precaution Comments: Patient with Aphasia Restrictions Weight Bearing Restrictions: Yes Other Position/Activity Restrictions: External defib pacing at 90 BPM, [place] Councilman, PICC.      Mobility Bed Mobility Overal bed mobility: Needs Assistance Bed Mobility: Supine to Sit     Supine to sit: Mod assist     General bed mobility comments: advancing hips to EOB Patient Response: Cooperative  Transfers Overall transfer level: Needs assistance   Transfers: Sit to/from [employer] Transfers Sit to Stand: Mod assist Stand pivot transfers: Max assist       General transfer comment: squat pivot to strong side    Balance Overall balance assessment: Needs  assistance Sitting-balance support: Single extremity supported;Feet supported Sitting balance-Leahy Scale: Fair     Standing balance support: Single extremity supported Standing balance-Leahy Scale: Poor Standing balance comment: relies on external support                           ADL either performed or assessed with clinical judgement   ADL Overall ADL's : Needs assistance/impaired Eating/Feeding: Set up;Sitting   Grooming: Wash/dry hands;Wash/dry face;Oral care;Sitting;Minimal assistance;Set up           Upper Body Dressing : Moderate assistance;Sitting   Lower Body Dressing: Maximal assistance;Bed level               Functional mobility during ADLs: Maximal assistance       Vision Baseline Vision/History: No visual deficits Patient Visual Report: No change from baseline       Perception     Praxis      Pertinent Vitals/Pain Pain Assessment: Faces Faces Pain Scale: Hurts little more Pain Location: R leg Pain Descriptors / Indicators: Guarding;Grimacing Pain Intervention(s): Monitored during session     Hand Dominance Left   Extremity/Trunk Assessment Upper Extremity Assessment Upper Extremity Assessment: RUE deficits/detail RUE Deficits / Details: flaccid with contractures RUE: Unable to fully assess due to pain RUE Coordination: decreased fine motor;decreased gross motor   Lower Extremity Assessment Lower Extremity Assessment: Defer to PT evaluation   Cervical / Trunk Assessment Cervical / Trunk Assessment: Kyphotic   Communication Communication Communication: Expressive difficulties   Cognition Arousal/Alertness: Awake/alert Behavior During  Therapy: WFL for tasks assessed/performed Overall Cognitive Status: Difficult to assess                                 General Comments: following commands.   General Comments       Exercises     Shoulder Instructions      Home Living Family/patient expects to be  discharged to:: Private residence Living Arrangements: Spouse/significant other Available Help at Discharge: Family;Available 24 hours/day Type of Home: House Home Access: Stairs to enter;Ramped entrance Entrance Stairs-Number of Steps: Patient agreeing there is a ramp?   Home Layout: One level     Bathroom Shower/Tub: Teacher, early years/pre: Standard     Home Equipment: Cane - quad;Wheelchair - manual   Additional Comments: Will need to confirm home environment from spouse      Prior Functioning/Environment Level of Independence: Needs assistance  Gait / Transfers Assistance Needed: Quad cand for mobility.  Wheelchair for community mobility ADL's / Homemaking Assistance Needed: chart states she is able to participate with bathing and dressing. Communication / Swallowing Assistance Needed: SLP eval 4/6.          OT Problem List: Decreased strength;Decreased range of motion;Decreased activity tolerance;Impaired balance (sitting and/or standing);Pain;Impaired UE functional use;Impaired tone      OT Treatment/Interventions: Self-care/ADL training;DME and/or AE instruction;Therapeutic activities;Balance training;Patient/family education    OT Goals(Current goals can be found in the care plan section) Acute Rehab OT Goals Patient Stated Goal: patient nods to "going home" OT Goal Formulation: With patient Time For Goal Achievement: 05/11/20 Potential to Achieve Goals: Fair ADL Goals Pt Will Perform Grooming: with supervision;sitting Pt Will Perform Upper Body Bathing: with supervision;sitting Pt Will Perform Upper Body Dressing: with supervision;sitting Pt Will Transfer to Toilet: with min assist;stand pivot transfer;bedside commode  OT Frequency: Min 2X/week   Barriers to D/C:    none noted       Co-evaluation              AM-PAC OT "6 Clicks" Daily Activity     Outcome Measure Help from another person eating meals?: A Little Help from another  person taking care of personal grooming?: A Little Help from another person toileting, which includes using toliet, bedpan, or urinal?: A Lot Help from another person bathing (including washing, rinsing, drying)?: A Lot Help from another person to put on and taking off regular upper body clothing?: A Lot Help from another person to put on and taking off regular lower body clothing?: A Lot 6 Click Score: 14   End of Session Equipment Utilized During Treatment: Gait belt;Oxygen Nurse Communication: Mobility status  Activity Tolerance: Patient tolerated treatment well Patient left: in chair;with call bell/phone within reach  OT Visit Diagnosis: Unsteadiness on feet (R26.81);Muscle weakness (generalized) (M62.81);Other abnormalities of gait and mobility (R26.89);Hemiplegia and hemiparesis Hemiplegia - Right/Left: Right Hemiplegia - caused by: Cerebral infarction                Time: 1005-1033 OT Time Calculation (min): 28 min Charges:  OT General Charges $OT Visit: 1 Visit OT Evaluation $OT Eval Moderate Complexity: 1 Mod OT Treatments $Therapeutic Activity: 8-22 mins  04/27/2020  Rich, OTR/L  Acute Rehabilitation Services  Office:  [place] 04/27/2020, 10:49 AM

## 2020-04-27 NOTE — Evaluation (Addendum)
Physical Therapy Evaluation Patient Details Name: [name] [name] MRN: [id number] DOB: [birth year] Today's Date: 04/27/2020   History of Present Illness  70 y.o. female with a hx of HTN, HLD, stroke (expessive aphasia and R sided deficit), sickle cell anemia, seizure d/o, chronic HF (systolic).  Underwent CORONARY ARTERY BYPASS GRAFTING (CABG)x2.  Clinical Impression  Pt admitted with/for CABGx2.  Pt having trouble moving within the parameters of sternal precautions, needing moderate to max assist overall.  Pt currently limited functionally due to the problems listed. ( See problems list.)   Pt will benefit from PT to maximize function and safety in order to get ready for next venue listed below.     Follow Up Recommendations SNF;Supervision/Assistance - 24 hour    Equipment Recommendations   (TBA further)    Recommendations for Other Services       Precautions / Restrictions Precautions Precautions: Sternal Restrictions Other Position/Activity Restrictions: External defib pacing at 90 BPM, G[place] Councilman, PICC.      Mobility  Bed Mobility Overal bed mobility: Needs Assistance Bed Mobility: Supine to Sit     Supine to sit: Mod assist     General bed mobility comments: assisted scoot to EOB, pt wanting to push with L UE from behind against sternal precautions.    Transfers Overall transfer level: Needs assistance   Transfers: Sit to/from [employer] Transfers Sit to Stand: Mod assist;Max assist   Squat pivot transfers: Mod assist;Max assist     General transfer comment: pivot to the L side, for stand needed significant forward assist along with boost.  Ambulation/Gait                Stairs            Wheelchair Mobility    Modified Rankin (Stroke Patients Only)       Balance   Sitting-balance support: No upper extremity supported;Single extremity supported;Feet supported Sitting balance-Leahy Scale: Fair       Standing balance-Leahy  Scale: Poor Standing balance comment: relies on external support                             Pertinent Vitals/Pain Faces Pain Scale: Hurts little more Pain Location: R leg Pain Descriptors / Indicators: Guarding;Grimacing Pain Intervention(s): Monitored during session    Home Living Family/patient expects to be discharged to:: Private residence Living Arrangements: Spouse/significant other Available Help at Discharge: Family;Available 24 hours/day Type of Home: House Home Access: Stairs to enter;Ramped entrance   Entrance Stairs-Number of Steps: Patient agreeing there is a ramp? Home Layout: One level Home Equipment: Cane - quad;Wheelchair - manual Additional Comments: Will need to confirm home environment from spouse    Prior Function Level of Independence: Needs assistance   Gait / Transfers Assistance Needed: Quad cane for mobility.  Wheelchair for community mobility  ADL's / Homemaking Assistance Needed: chart states she is able to participate with bathing and dressing.        Hand Dominance   Dominant Hand: Left    Extremity/Trunk Assessment   Upper Extremity Assessment Upper Extremity Assessment: Defer to OT evaluation    Lower Extremity Assessment Lower Extremity Assessment: RLE deficits/detail RLE Deficits / Details: increased/altered extensor tone, minimal voluntary movement RLE Coordination: decreased gross motor;decreased fine motor    Cervical / Trunk Assessment Cervical / Trunk Assessment: Kyphotic  Communication   Communication: Expressive difficulties  Cognition Arousal/Alertness: Awake/alert Behavior During Therapy: WFL for tasks assessed/performed Overall  Cognitive Status: Difficult to assess                                 General Comments: can follow commands, but if pt's wants something out of context, it gets in the way of her focus on execution.      General Comments General comments (skin integrity, edema,  etc.): vss, on external pacing    Exercises     Assessment/Plan    PT Assessment Patient needs continued PT services  PT Problem List Decreased strength;Decreased activity tolerance;Decreased mobility;Decreased knowledge of precautions;Cardiopulmonary status limiting activity;Pain;Impaired tone       PT Treatment Interventions Gait training;Functional mobility training;Therapeutic activities;Balance training;Patient/family education;DME instruction    PT Goals (Current goals can be found in the Care Plan section)  Acute Rehab PT Goals Patient Stated Goal: patient nods to "going home" PT Goal Formulation: With patient Time For Goal Achievement: 05/11/20 Potential to Achieve Goals: Fair    Frequency Min 3X/week   Barriers to discharge        Co-evaluation               AM-PAC PT "6 Clicks" Mobility  Outcome Measure Help needed turning from your back to your side while in a flat bed without using bedrails?: A Lot Help needed moving from lying on your back to sitting on the side of a flat bed without using bedrails?: A Lot Help needed moving to and from a bed to a chair (including a wheelchair)?: A Lot Help needed standing up from a chair using your arms (e.g., wheelchair or bedside chair)?: A Lot Help needed to walk in hospital room?: A Lot Help needed climbing 3-5 steps with a railing? : Total 6 Click Score: 11    End of Session   Activity Tolerance: Patient tolerated treatment well Patient left: in chair;with call bell/phone within reach;with family/visitor present Nurse Communication: Mobility status PT Visit Diagnosis: Other abnormalities of gait and mobility (R26.89);Difficulty in walking, not elsewhere classified (R26.2);Hemiplegia and hemiparesis Hemiplegia - Right/Left: Right Hemiplegia - caused by: Cerebral infarction    Time: 3704-8889 PT Time Calculation (min) (ACUTE ONLY): 34 min   Charges:   PT Evaluation $PT Eval Moderate Complexity: 1 Mod PT  Treatments $Therapeutic Activity: 8-22 mins        04/27/2020  Ginger Carne., PT Acute Rehabilitation Services [phone]  (pager) [phone]  (office)  [name] 04/27/2020, 4:47 PM

## 2020-04-27 NOTE — Progress Notes (Addendum)
Electrophysiology Rounding Note  Patient Name: [name] Date of Encounter: 04/27/2020  Primary Cardiologist: Skeet Latch, MD Electrophysiologist: New   Subjective   Asked to see again today for persistent CHB s/p revascularization.   Remains ill with dual pressor support and volume overload. Low grade fever overnight.   She is feeling OK this am. No specific complaints.   Inpatient Medications    Scheduled Meds: . sodium chloride   Intravenous Once  . acetaminophen  1,000 mg Oral Q6H   Or  . acetaminophen (TYLENOL) oral liquid 160 mg/5 mL  1,000 mg Per Tube Q6H  . aspirin EC  325 mg Oral Daily   Or  . aspirin  324 mg Per Tube Daily  . atorvastatin  80 mg Oral QHS  . baclofen  20 mg Oral BID  . bisacodyl  10 mg Oral Daily   Or  . bisacodyl  10 mg Rectal Daily  . Chlorhexidine Gluconate Cloth  6 each Topical Daily  . Chlorhexidine Gluconate Cloth  6 each Topical Daily  . docusate sodium  200 mg Oral Daily  . enoxaparin (LOVENOX) injection  40 mg Subcutaneous QHS  . furosemide  40 mg Intravenous BID  . insulin aspart  0-9 Units Subcutaneous TID WC  . lacosamide  100 mg Oral BID  . levETIRAcetam  1,500 mg Oral BID  . mouth rinse  15 mL Mouth Rinse BID  . pantoprazole  40 mg Oral Daily  . potassium chloride  40 mEq Oral Once  . sodium chloride flush  3 mL Intravenous Q12H   Continuous Infusions: . sodium chloride    . sodium chloride    . sodium chloride    . cefUROXime (ZINACEF)  IV 1.5 g (04/26/20 2300)  . dexmedetomidine (PRECEDEX) IV infusion Stopped (04/26/20 0615)  . lactated ringers    . lactated ringers 20 mL/hr at 04/27/20 0800  . milrinone 0.125 mcg/kg/min (04/27/20 0812)  . norepinephrine (LEVOPHED) Adult infusion 4 mcg/min (04/27/20 0030)  . potassium chloride 10 mEq (04/27/20 0905)  . potassium chloride     PRN Meds: sodium chloride, midazolam, morphine injection, ondansetron (ZOFRAN) IV, oxyCODONE, polyethylene glycol, sodium  chloride flush, traMADol   Vital Signs    Vitals:   04/27/20 0645 04/27/20 0700 04/27/20 0715 04/27/20 0800  BP: (!) 107/55 (!) 106/56 102/60 107/61  Pulse: 90 91 89 91  Resp: 19 (!) 23 20 (!) 24  Temp:  99 F (37.2 C)    TempSrc:  Oral    SpO2: 90% 97% 94% 95%  Weight:      Height:        Intake/Output Summary (Last 24 hours) at 04/27/2020 0947 Last data filed at 04/27/2020 0800 Gross per 24 hour  Intake 1049.91 ml  Output 1780 ml  Net -730.09 ml   Filed Weights   04/25/20 0500 04/26/20 0500 04/27/20 0500  Weight: 54.3 kg 61.4 kg 59.8 kg    Physical Exam    GEN- Appears older than stated age, Alert and oriented x 3 today.   Head- normocephalic, atraumatic Eyes-  Sclera clear, conjunctiva pink Ears- hearing intact Oropharynx- clear Neck- supple Lungs- Clear to ausculation bilaterally, normal work of breathing Heart- Regular rate and rhythm, no murmurs, rubs or gallops GI- soft, NT, ND, + BS Extremities- no clubbing or cyanosis. No edema Skin- no rash or lesion Psych- euthymic mood, full affect Neuro- strength and sensation are intact. Expressive aphasia.  Labs    CBC Recent Labs  04/26/20 1555 04/27/20 0359  WBC 11.0* 13.8*  HGB 9.9* 9.6*  HCT 29.5* 28.4*  MCV 87.5 86.1  PLT 117* 025*   Basic Metabolic Panel Recent Labs    04/26/20 0117 04/26/20 0811 04/26/20 1555 04/27/20 0359  NA 136   < > 137 137  K 4.1   < > 4.3 3.5  CL 105  --  106 106  CO2 22  --  25 26  GLUCOSE 170*  --  139* 79  BUN 13  --  13 14  CREATININE 0.90  --  1.06* 0.86  CALCIUM 8.7*  --  8.2* 8.0*  MG 2.9*  --  2.2  --    < > = values in this interval not displayed.   Liver Function Tests No results for input(s): AST, ALT, ALKPHOS, BILITOT, PROT, ALBUMIN in the last 72 hours. No results for input(s): LIPASE, AMYLASE in the last 72 hours. Cardiac Enzymes No results for input(s): CKTOTAL, CKMB, CKMBINDEX, TROPONINI in the last 72 hours.   Telemetry    V paced 90  (personally reviewed)  Radiology    DG Chest Port 1 View  Result Date: 04/27/2020 CLINICAL DATA:  Sore chest. EXAM: PORTABLE CHEST 1 VIEW COMPARISON:  04/26/2020. FINDINGS: Interim extubation and removal of NG tube. Interim removal of mediastinal drainage catheter left chest tube. Right IJ sheath in stable position. Prior CABG. Cardiomegaly. Bibasilar atelectasis. Diffuse bilateral interstitial prominence and bilateral pleural effusions again noted suggesting CHF. Similar findings noted on prior exam. IMPRESSION: 1. Interim extubation and removal of NG tube. Interim removal of mediastinal drainage catheter and left chest tube. No pneumothorax. 2. Prior CABG. Cardiomegaly with bilateral interstitial prominence and bilateral pleural effusions again noted. Findings consistent CHF. Persistent bibasilar atelectasis. Electronically Signed   By: Marcello Moores  Register   On: 04/27/2020 05:38   DG Chest Port 1 View  Result Date: 04/26/2020 CLINICAL DATA:  Open-heart surgery. EXAM: PORTABLE CHEST 1 VIEW COMPARISON:  04/25/2020. FINDINGS: Endotracheal tube 14 mm above the carina. NG tube, right IJ sheath, mediastinal drainage catheter, left chest tube in stable position. Prior CABG. Heart size stable. Low lung volumes with bibasilar atelectasis. Bilateral mild interstitial prominence. A component of CHF cannot be excluded. Small bilateral pleural effusions noted. No pneumothorax. Peripheral vascular calcification. Mild thoracic spine scoliosis. IMPRESSION: 1.  Endotracheal tube 14 mm above the carina. 2. NG tube, right IJ sheath, mediastinal drainage catheter, left chest tube in stable position. No pneumothorax. 3. Prior CABG. Heart size stable. Mild bilateral interstitial prominence noted on today's exam suggesting interstitial edema. Small bilateral pleural effusions noted. Findings suggest mild CHF. Electronically Signed   By: Marcello Moores  Register   On: 04/26/2020 06:00   DG Chest Port 1 View  Result Date:  04/25/2020 CLINICAL DATA:  Post CABG EXAM: PORTABLE CHEST 1 VIEW COMPARISON:  04/21/2020 FINDINGS: Changes of CABG. Left chest tube in place. No pneumothorax. Endotracheal tube tip is less than 1 cm above the carina. NG tube is in the stomach. Right central line tip in the SVC. No pneumothorax. Heart is normal size. Mild vascular congestion and bibasilar atelectasis. IMPRESSION: Postoperative changes. Endotracheal tube just above the carina by 8 mm. Vascular congestion, bibasilar atelectasis. Electronically Signed   By: Rolm Baptise M.D.   On: 04/25/2020 18:20   ECHO INTRAOPERATIVE TEE  Result Date: 04/25/2020  *INTRAOPERATIVE TRANSESOPHAGEAL REPORT *  Patient Name:   Addisen MOORE [name] Date of Exam: 04/25/2020 Medical Rec #:  [id number]  Height:       67.0 in Accession #:    [id number]           Weight:       119.7 lb Date of Birth:  1950-12-19           BSA:          1.63 m Patient Age:    68 years             BP:           113/55 mmHg Patient Gender: F                    HR:           76 bpm. Exam Location:  Anesthesiology Transesophogeal exam was perform intraoperatively during surgical procedure. Patient was closely monitored under general anesthesia during the entirety of examination. Indications:     Coronary Artery Disease Sonographer:     Bernadene Person RDCS Performing Phys: [place] Diagnosing Phys: Annye Asa MD PROCEDURE: Intraoperative Transesophogeal Teeth intact after easy TEE probe insertion. Complications: No known complications during this procedure. POST-OP IMPRESSIONS Limited Post CPB exam: The patient separated from CPB easily with low MIlrinone support. - Left Ventricle: The overall left ventricular is essentially unchanged from pre-bypass images. There is some interval improvement in the basal region, and also in that infero-septal wall. Overall EF 45-50%. - Right Ventricle: The right ventricular function appears unchanged from pre-bypass images. - Aortic  Valve: The aortic valve function appears unchanged from pre-bypass images. Mild, anterior directed AI is present. - Mitral Valve: The mitral valve function appears unchanged from pre-bypass images. - Tricuspid Valve: The tricuspid valve function appears unchanged from pre-bypass images. PRE-OP FINDINGS  Left Ventricle: The left ventricle has mild-moderately reduced systolic function, with an ejection fraction of 40-45%, measured 40%. The cavity size was mildly dilated. Left ventricular has diffuse hypokinesis, with Severe hypokinesis of the Basal inferoseptal wall and entire basal region. There is no left ventricular hypertrophy. Left ventricular diastolic function was not evaluated. Right Ventricle: The right ventricle has normal systolic function. The cavity was normal. There is no increase in right ventricular wall thickness. Left Atrium: Left atrial size was normal in size. No left atrial/left atrial appendage thrombus was detected. Left atrial appendage velocity is normal at greater than 40 cm/s. Right Atrium: Right atrial size was normal in size. Catheter present traversing through the right atrium. Interatrial Septum: No atrial level shunt detected by color flow Doppler. There is left bowing of the interatrial septum, suggestive of elevated right atrial pressure. There is no evidence of a patent foramen ovale by color doppler interrogation. Pericardium: There is no evidence of pericardial effusion. Mitral Valve: The mitral valve is normal in structure. Mitral valve regurgitation is trivial by color flow Doppler. There is no evidence of mitral valve vegetation. There is no evidence of mitral stenosis, with peak gradient 3 mmHg, mean gradient 1 mmHg. Tricuspid Valve: The tricuspid valve was normal in structure. Tricuspid valve regurgitation is trivial by color flow Doppler. No evidence of tricuspid stenosis is present. There is no evidence of tricuspid valve vegetation. Aortic Valve: The aortic valve is  tricuspid. Aortic valve regurgitation is mild by color flow Doppler. The jet is anteriorly-directed toward the anterior leaflet of the mitral valve. There is no stenosis of the aortic valve, with peak gradient 8 mmHg, mean gradient 4 mmHg. There is no evidence of aortic valve vegetation. Pulmonic Valve: The pulmonic  valve was normal in structure, with normal leaflet excursion. No evidence of pulmonic stenosis. Pulmonic valve regurgitation is trivial, around the PA catheter, by color flow Doppler. Aorta: There is evidence of calcification at the Sinotubular ridge and in the proximal ascending aorta. There is scattered moderate plaque in the aortic arch and descending aortic.Marland Kitchen Pulmonary Artery: Gordy Councilman catheter present on the right. The pulmonary artery is of normal size. Venous: The inferior vena cava is normal in size with less than 50% respiratory variability, suggesting right atrial pressure of 8 mmHg. Shunts: There is no evidence of an atrial septal defect. +-------------+--------++ AORTIC VALVE          +-------------+--------++ AV Mean Grad:4.0 mmHg +-------------+--------++ +-------------+--------++ MITRAL VALVE          +-------------+--------++ MV Mean grad:1.0 mmHg +-------------+--------++  Annye Asa MD Electronically signed by Annye Asa MD Signature Date/Time: 04/25/2020/5:27:32 PM    Final     Patient Profile     Maraki Macquarrie [name] is a 70 y.o. female with a hx of HTN, HLD, stroke (expessive aphasia and R sided deficit), sickle cell anemia, seizure d/o, chronic HF (systolic),  who is being seen today for the evaluation of CHB at the request of Dr. Aundra Dubin.  - Admitted for CHB.  - Echo with EF 35-40%, inferior and lateral HK, normal RV.  - Coronary angiography: 60-70% dLM, 60-70% ostial LCx, 99% proximal calcified LCx (large/codominant LCx) - 4/4  S/p CABG x 4 (LIMA-LAD, SVG-OM2) - 4/5 Extubated   Assessment & Plan    1. CHB This has persisted despite  revascularization.  She remains ill post op with low grade fever overnight, white count climbing post op, and dual pressor support with NE and Milrinone. Ideally Lulabelle Desta be further out from surgery and more improved prior to pursuing pacing.  Blood Cx and UA pending with WBC up to 14K this am. Senora Lacson follow.  If infected, Tamorah Hada also need to have strategy to minimize risk of infection from pacing.   Dr. Curt Bears has seen. We Ajaya Crutchfield follow at a distance tomorrow and see her again Friday morning for possible pacing pending course.   2. CAD s/p CABG x 4 ((LIMA-LAD, SVG-OM2)) TCTS and HF clinic following and managing.  For questions or updates, please contact [place] Please consult [email or web address] for contact info under Cardiology/STEMI.  Signed, Shirley Friar, PA-C  04/27/2020, 9:47 AM   I have seen and examined this patient with Oda Kilts.  Agree with above, note added to reflect my findings.  On exam, RRR, no murmurs.  Patient is postop day 2 from CABG x4.  She did present to the hospital previously with ventricular standstill.  Her beta-blockers were stopped and AV conduction returned.  After her CABG, she has had complete heart block.  Would continue current management per heart failure and cardiac surgery.  We Titania Gault continue to evaluate her AV conduction, and if it does not improve, pacemaker implant would likely be indicated.  Owain Eckerman M. Davionna Blacksher MD 04/27/2020 10:51 AM

## 2020-04-27 NOTE — Progress Notes (Addendum)
Patient ID: [name], female   DOB: [birth year], 70 y.o.   MRN: [id number]     Advanced Heart Failure Rounding Note  PCP-Cardiologist: Skeet Latch, MD  AHF: Dr. Aundra Dubin   Subjective:   - Admitted for CHB.  - Echo with EF 35-40%, inferior and lateral HK, normal RV.  - Coronary angiography: 60-70% dLM, 60-70% ostial LCx, 99% proximal calcified LCx (large/codominant LCx) - 4/4  S/p CABG x 4 (LIMA-LAD, SVG-OM2) - 4/5 Extubated   Hypotensive overnight, NE added, on 2 mcg + Milrinone 0.25. Co-ox 69%  CVP 7-8. Wt still up 12 lb from pre-op wt.   Remains in CHB, A-V paced.   Febrile yesterday mTemp 100.4. WBC trending up 11>>13.8. remains on post-op abx.   Objective:   Weight Range: 59.8 kg Body mass index is 20.65 kg/m.   Vital Signs:   Temp:  [98.5 F (36.9 C)-100.4 F (38 C)] 99 F (37.2 C) (04/06 0700) Pulse Rate:  [69-106] 89 (04/06 0715) Resp:  [13-31] 20 (04/06 0715) BP: (72-169)/(47-74) 102/60 (04/06 0715) SpO2:  [81 %-100 %] 94 % (04/06 0715) Arterial Line BP: (101-131)/(47-57) 105/53 (04/05 1300) FiO2 (%):  [40 %] 40 % (04/05 0800) Weight:  [59.8 kg] 59.8 kg (04/06 0500) Last BM Date: 04/19/20  Weight change: Filed Weights   04/25/20 0500 04/26/20 0500 04/27/20 0500  Weight: 54.3 kg 61.4 kg 59.8 kg    Intake/Output:   Intake/Output Summary (Last 24 hours) at 04/27/2020 0735 Last data filed at 04/27/2020 0600 Gross per 24 hour  Intake 871.85 ml  Output 2065 ml  Net -1193.15 ml      Physical Exam    CVP 7-8  General:  Well appearing. Now extubated, alert and interactive aphasic  HEENT: normal dentition in poor repair  Neck: supple. JVD 8 cm. Carotids 2+ bilat; no bruits. No lymphadenopathy or thyromegaly appreciated. Cor: PMI nondisplaced. Regular rate & rhythm. No rubs, gallops or murmurs. + sternal surgical dressing + CTs  Lungs:  Decreased BS at the bases  Abdomen: soft, nontender, nondistended. No hepatosplenomegaly. No bruits or  masses. Good bowel sounds. Extremities: no cyanosis, clubbing, rash, edema Neuro: awake, alert and interactive, aphasic      Telemetry   A-V Paced 80s  (personally reviewed)  Labs    CBC Recent Labs    04/26/20 1555 04/27/20 0359  WBC 11.0* 13.8*  HGB 9.9* 9.6*  HCT 29.5* 28.4*  MCV 87.5 86.1  PLT 117* 509*   Basic Metabolic Panel Recent Labs    04/26/20 0117 04/26/20 0811 04/26/20 1555 04/27/20 0359  NA 136   < > 137 137  K 4.1   < > 4.3 3.5  CL 105  --  106 106  CO2 22  --  25 26  GLUCOSE 170*  --  139* 79  BUN 13  --  13 14  CREATININE 0.90  --  1.06* 0.86  CALCIUM 8.7*  --  8.2* 8.0*  MG 2.9*  --  2.2  --    < > = values in this interval not displayed.   Liver Function Tests No results for input(s): AST, ALT, ALKPHOS, BILITOT, PROT, ALBUMIN in the last 72 hours. No results for input(s): LIPASE, AMYLASE in the last 72 hours. Cardiac Enzymes No results for input(s): CKTOTAL, CKMB, CKMBINDEX, TROPONINI in the last 72 hours.  BNP: BNP (last 3 results) No results for input(s): BNP in the last 8760 hours.  ProBNP (last 3 results) No results for input(s):  PROBNP in the last 8760 hours.   D-Dimer No results for input(s): DDIMER in the last 72 hours. Hemoglobin A1C No results for input(s): HGBA1C in the last 72 hours. Fasting Lipid Panel No results for input(s): CHOL, HDL, LDLCALC, TRIG, CHOLHDL, LDLDIRECT in the last 72 hours. Thyroid Function Tests No results for input(s): TSH, T4TOTAL, T3FREE, THYROIDAB in the last 72 hours.  Invalid input(s): FREET3  Other results:   Imaging    DG Chest Port 1 View  Result Date: 04/27/2020 CLINICAL DATA:  Sore chest. EXAM: PORTABLE CHEST 1 VIEW COMPARISON:  04/26/2020. FINDINGS: Interim extubation and removal of NG tube. Interim removal of mediastinal drainage catheter left chest tube. Right IJ sheath in stable position. Prior CABG. Cardiomegaly. Bibasilar atelectasis. Diffuse bilateral interstitial prominence  and bilateral pleural effusions again noted suggesting CHF. Similar findings noted on prior exam. IMPRESSION: 1. Interim extubation and removal of NG tube. Interim removal of mediastinal drainage catheter and left chest tube. No pneumothorax. 2. Prior CABG. Cardiomegaly with bilateral interstitial prominence and bilateral pleural effusions again noted. Findings consistent CHF. Persistent bibasilar atelectasis. Electronically Signed   By: Marcello Moores  Register   On: 04/27/2020 05:38     Medications:     Scheduled Medications: . sodium chloride   Intravenous Once  . acetaminophen  1,000 mg Oral Q6H   Or  . acetaminophen (TYLENOL) oral liquid 160 mg/5 mL  1,000 mg Per Tube Q6H  . aspirin EC  325 mg Oral Daily   Or  . aspirin  324 mg Per Tube Daily  . atorvastatin  80 mg Oral QHS  . baclofen  20 mg Oral BID  . bisacodyl  10 mg Oral Daily   Or  . bisacodyl  10 mg Rectal Daily  . Chlorhexidine Gluconate Cloth  6 each Topical Daily  . Chlorhexidine Gluconate Cloth  6 each Topical Daily  . docusate sodium  200 mg Oral Daily  . enoxaparin (LOVENOX) injection  40 mg Subcutaneous QHS  . furosemide  40 mg Intravenous Daily  . insulin aspart  0-9 Units Subcutaneous TID WC  . lacosamide  100 mg Oral BID  . levETIRAcetam  1,500 mg Oral BID  . mouth rinse  15 mL Mouth Rinse BID  . pantoprazole  40 mg Oral Daily  . sodium chloride flush  3 mL Intravenous Q12H    Infusions: . sodium chloride    . sodium chloride    . sodium chloride    . cefUROXime (ZINACEF)  IV 1.5 g (04/26/20 2300)  . dexmedetomidine (PRECEDEX) IV infusion Stopped (04/26/20 0615)  . lactated ringers    . lactated ringers 20 mL/hr at 04/27/20 0600  . milrinone 0.25 mcg/kg/min (04/27/20 0600)  . norepinephrine (LEVOPHED) Adult infusion 4 mcg/min (04/27/20 0030)  . potassium chloride 10 mEq (04/27/20 0731)  . potassium chloride      PRN Medications: sodium chloride, metoprolol tartrate, midazolam, morphine injection,  ondansetron (ZOFRAN) IV, oxyCODONE, polyethylene glycol, sodium chloride flush, traMADol   Assessment/Plan   1. Complete heart block: Patient had baseline NSR with LAFB/RBBB, so there was underlying conduction system disease.  Suspect this progressed to CHB with beta blocker/digoxin use + chronic ischemia in territory of large  co-dominant LCx.  - Off nodal blockers.  - remains in CHB despite revascularization, s/p CABG - will consult EP for PPM  2. Chronic systolic CHF: Most recent prior echo in 8/20 with EF 35-40% and wall motion abnormalities.  Ischemic cardiomyopathy with left main/severe codominant LCx disease.  Echo this admission with stable EF 35-40%, inferior and lateral hypokinesis.  Not significantly volume overloaded on exam. Per her husband, at baseline she is aphasic but can walk around the house and do her ADLs. Now s/p CABG 4/4. On NE 2 for BP support + milrinone 0.25, Co-ox 80%. Diuresing well but remains 12 lb above pre-op wt. CVP 7-8  - Wean milrinone to 0.125. Wean NE as tolerated. Follow co-ox - Continue IV Lasix 40 mg daily  - Hold digoxin and Coreg with CHB.  - Hold Entresto with soft BP.  3. 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  Has been on warfarin.  Has had long-standing aphasia and right hemiparesis.  Can walk around house and do ADLs.  - Warfarin on hold. Resume anticoagulation once ok w/ CT surgery   4. H/o seizure disorder: Continue Keppra.  5. ?Sickle cell anemia: Carries this diagnosis but not sure truly present.  6. CAD:  LHC showed 60-70% distal left main stenosis, heavily calcified proximal LCx with 60-70% stenosis at the ostium and discrete 99% stenosis in the proximal LCx.  I suspect that her CHB is related to chronic ischemia from LM-LCx disease worsened by nodal blockade.  Difficult situation.  She has surgical disease but will be more difficult to rehab after CABG with weakness from prior stroke though per her husband, she does all ADLs and walks with  cane at home.  PCI would be possible but high risk/difficult heavy calcification and tortuousity. Had CABG x 2 4/4 w/ LIMA-LAD, SVG-OM2  - Continue ASA 81, statin.  7. ID: WBC trending up, 11>>14K. Currently AF. Check blood cultures and UA. CXR ordered.  Length of Stay: [address] [place]  04/27/2020, 7:35 AM  Advanced Heart Failure Team Pager [phone] (M-F; 7a - 5p)  Please contact [facility] for night-coverage after hours (5p -7a ) and weekends on [email or web address]  Patient seen with PA, agree with the above note.   Currently on NE 2, milrinone 0.25.  I/Os negative with IV Lasix yesterday.  CVP 8 but weight still up.  Co-ox 69%.    She remains dependent on pacing wires.   Eating breakfast.  No apparent complaints.   General: NAD Neck: JVP 8-9 cm, no thyromegaly or thyroid nodule.  Lungs: Clear to auscultation bilaterally with normal respiratory effort. CV: Nondisplaced PMI.  Heart regular S1/S2, no S3/S4, no murmur.  No peripheral edema.   Abdomen: Soft, nontender, no hepatosplenomegaly, no distention.  Skin: Intact without lesions or rashes.  Neurologic: Alert, follows commands.  Aphasic.  Extremities: No clubbing or cyanosis.  HEENT: Normal.   Decrease milrinone to 0.125 today and wean off NE as BP tolerates.  Will continue Lasix 40 mg IV bid as weight remains significantly up and creatinine stable.   I suspect she is going to need a PPM (CHB underlying, dependent on pacing wires still).  Will ask EP to see.  Would need BiV pacing with low EF (35-40% pre-op echo).   Mobilize.   Nurse with some concerns [id number], will ask for speech/swallow eval.   CRITICAL CARE Performed by: Loralie Champagne  Total critical care time: 35 minutes  Critical care time was exclusive of separately billable procedures and treating other patients.  Critical care was necessary to treat or prevent imminent or life-threatening deterioration.  Critical care was time spent personally by  me on the following activities: development of treatment plan with patient and/or surrogate as well as nursing, discussions with consultants, evaluation  of patient's response to treatment, examination of patient, obtaining history from patient or surrogate, ordering and performing treatments and interventions, ordering and review of laboratory studies, ordering and review of radiographic studies, pulse oximetry and re-evaluation of patient's condition.   Loralie Champagne 04/27/2020 8:09 AM

## 2020-04-27 NOTE — Progress Notes (Signed)
2 Days Post-Op Procedure(s) (LRB): CORONARY ARTERY BYPASS GRAFTING (CABG) TIMES TWO USING LEFT INTERNAL MAMMARY ARTERY AND RIGHT GREATER SAPHENOUS VEIN HARVESTED ENDOSCOPICALLY (N/A) TRANSESOPHAGEAL ECHOCARDIOGRAM (TEE) Subjective: Alert and in good spirits  Objective: Vital signs in last 24 hours: Temp:  [98.5 F (36.9 C)-100.4 F (38 C)] 99 F (37.2 C) (04/06 0700) Pulse Rate:  [69-106] 89 (04/06 0715) Cardiac Rhythm: A-V Sequential paced (04/06 0400) Resp:  [13-31] 20 (04/06 0715) BP: (72-169)/(47-74) 102/60 (04/06 0715) SpO2:  [81 %-100 %] 94 % (04/06 0715) Arterial Line BP: (101-131)/(47-57) 105/53 (04/05 1300) FiO2 (%):  [40 %] 40 % (04/05 0800) Weight:  [59.8 kg] 59.8 kg (04/06 0500)  Hemodynamic parameters for last 24 hours: CVP:  [4 mmHg-13 mmHg] 9 mmHg  Intake/Output from previous day: 04/05 0701 - 04/06 0700 In: 871.9 [I.V.:644.4; IV Piggyback:227.4] Out: 2065 [Urine:1975; Chest Tube:90] Intake/Output this shift: No intake/output data recorded.  General appearance: alert, cooperative and no distress Neurologic: unchanged Heart: regular rate and rhythm Lungs: diminished breath sounds bibasilar Abdomen: normal findings: soft, non-tender  Lab Results: Recent Labs    04/26/20 1555 04/27/20 0359  WBC 11.0* 13.8*  HGB 9.9* 9.6*  HCT 29.5* 28.4*  PLT 117* 120*   BMET:  Recent Labs    04/26/20 1555 04/27/20 0359  NA 137 137  K 4.3 3.5  CL 106 106  CO2 25 26  GLUCOSE 139* 79  BUN 13 14  CREATININE 1.06* 0.86  CALCIUM 8.2* 8.0*    PT/INR:  Recent Labs    04/25/20 1751  LABPROT 19.0*  INR 1.7*   ABG    Component Value Date/Time   PHART 7.378 04/26/2020 0934   HCO3 21.9 04/26/2020 0934   TCO2 23 04/26/2020 0934   ACIDBASEDEF 3.0 (H) 04/26/2020 0934   O2SAT 68.9 04/27/2020 0351   CBG (last 3)  Recent Labs    04/27/20 0400 04/27/20 0427 04/27/20 0658  GLUCAP 64* 78 116*    Assessment/Plan: S/P Procedure(s) (LRB): CORONARY ARTERY  BYPASS GRAFTING (CABG) TIMES TWO USING LEFT INTERNAL MAMMARY ARTERY AND RIGHT GREATER SAPHENOUS VEIN HARVESTED ENDOSCOPICALLY (N/A) TRANSESOPHAGEAL ECHOCARDIOGRAM (TEE) -NEURO- unchanged from baseline R hemiparesis and aphasia  ? Aspiration- will have Speech see for diet rec CV- still pacer dependent- will likely need PPM, will d/w Cardiology  Co-ox 69- Milrinone, norepi per AHF team  Will hold off another day on restarting coumadin RESP- Bibasilar atelectasis, small effusions  Struggling with IS will order a flutter valve RENAL- creatinine normal, K low normal  IV Lasix again this Am ENDO- CBG low this AM  Dc levemir, change SSI to [facility] and HS GI- tolerating Po Mobilize- PT/OT SCD + enoxaparin for DVT prophylaxis until INR > 2   LOS: 6 days    Melrose Nakayama 04/27/2020

## 2020-04-27 NOTE — Progress Notes (Signed)
TCTS Evening Rounds  POD #2 s/p CABG Improving; no complaints Ate full dinner  PE: BP 120/61   Pulse 89   Temp 100.2 F (37.9 C) (Oral)   Resp (!) 22   Ht 5\' 7"  (1.702 m)   Wt 59.8 kg   SpO2 96%   BMI 20.65 kg/m  Alert/oriented Hoarse CTA RRR (paced) Ext: mild edema   Intake/Output Summary (Last 24 hours) at 04/27/2020 1907 Last data filed at 04/27/2020 1900 Gross per 24 hour  Intake 1480.53 ml  Output 1295 ml  Net 185.53 ml    A/p:  PPM tomorrow NPO p MN [name] Z. Orvan Seen, [place]

## 2020-04-27 NOTE — Evaluation (Signed)
Clinical/Bedside Swallow Evaluation Patient Details  Name: [name] MRN: [id number] Date of Birth: [birth year]  Today's Date: 04/27/2020 Time: SLP Start Time (ACUTE ONLY): 4665 SLP Stop Time (ACUTE ONLY): 0940 SLP Time Calculation (min) (ACUTE ONLY): 22 min  Past Medical History:  Past Medical History:  Diagnosis Date  . Adenomatous polyp of colon 09/2012   repeat colonoscopy in 5 years by Dr. Sharlett Iles  . CHF (congestive heart failure) (HCC)    EF 15-20% as of 05/28/11 (Dr Le[facility] summary)  . CVA (cerebral infarction) 12/2007  . Hemiparesis ([place])   . Hyperlipidemia   . Hypertension   . Seizures ([place])   . Sickle cell anemia (HCC)   . Stroke ([place])   . Vitamin D deficiency 2010   Past Surgical History:  Past Surgical History:  Procedure Laterality Date  . ABDOMINAL HYSTERECTOMY    . CORONARY ARTERY BYPASS GRAFT N/A 04/25/2020   Procedure: CORONARY ARTERY BYPASS GRAFTING (CABG) TIMES TWO USING LEFT INTERNAL MAMMARY ARTERY AND RIGHT GREATER SAPHENOUS VEIN HARVESTED ENDOSCOPICALLY;  Surgeon: Melrose Nakayama, MD;  Location: [place];  Service: Open Heart Surgery;  Laterality: N/A;  . FRACTURE SURGERY    . HIP SURGERY    . LEFT HEART CATH AND CORONARY ANGIOGRAPHY N/A 04/22/2020   Procedure: LEFT HEART CATH AND CORONARY ANGIOGRAPHY;  Surgeon: Larey Dresser, MD;  Location: [place] [facility];  Service: Cardiovascular;  Laterality: N/A;  . TEE WITHOUT CARDIOVERSION  04/25/2020   Procedure: TRANSESOPHAGEAL ECHOCARDIOGRAM (TEE);  Surgeon: Melrose Nakayama, MD;  Location: [facility];  Service: Open Heart Surgery;;  . TEMPORARY PACEMAKER N/A 04/21/2020   Procedure: TEMPORARY PACEMAKER;  Surgeon: Larey Dresser, MD;  Location: [place] [facility];  Service: Cardiovascular;  Laterality: N/A;  . TUBAL LIGATION     HPI:  Pt is a 70 y.o. female who presented with a syncopal episode and was found to have a complete heart block. Intubated from 4/4 to 4/5. CXR 4/5:  Cardiomegaly with bilateral interstitial prominence and bilateral pleural effusions again noted; findings consistent CHF; persistent bibasilar atelectasis. Most recent BSE occured on 09/14/2018 and she was discharged on DYS 3 diet with thin liquids. Previous L MCA in 2009 which resulted in residual aphasia and right hemiplegia. PMH signifiacnt for seizures, HTN, CHF.   Assessment / Plan / Recommendation Clinical Impression  Pt demonstrated no overt s/s of aspiration during all PO trials even when challenged with consecutive sips of water. Some oral signs of dysphagia were noted especially with thin liquids in which minimal anterior spillage was occured, but this is consistent with previous swallow evaluation. She responded to yes/no questions and used gestures to convey that she normally consumes softer foods. Given these findings, recommend a DYS 3 diet with thin liquids and SLP will continue to follow given reports of difficulty from nursing over night.  SLP Visit Diagnosis: Dysphagia, unspecified (R13.10)    Aspiration Risk  Mild aspiration risk    Diet Recommendation Dysphagia 3 (Mech soft);Thin liquid   Liquid Administration via: Cup;Straw Medication Administration: Whole meds with puree Supervision: Staff to assist with self feeding;Full supervision/cueing for compensatory strategies Compensations: Minimize environmental distractions;Slow rate;Small sips/bites Postural Changes: Seated upright at 90 degrees    Other  Recommendations Oral Care Recommendations: Oral care BID   Follow up Recommendations  (tba)      Frequency and Duration min 2x/week  2 weeks       Prognosis Prognosis for Safe Diet Advancement: Good Barriers  to Reach Goals: Cognitive deficits      Swallow Study   General HPI: Pt is a 70 y.o. female who presented with a syncopal episode and was found to have a complete heart block. Intubated from 4/4 to 4/5. CXR 4/5: Cardiomegaly with bilateral interstitial  prominence and bilateral pleural effusions again noted; findings consistent CHF; persistent bibasilar atelectasis. Most recent BSE occured on 09/14/2018 and she was discharged on DYS 3 diet with thin liquids. Previous L MCA in 2009 which resulted in residual aphasia and right hemiplegia. PMH signifiacnt for seizures, HTN, CHF. Type of Study: Bedside Swallow Evaluation Previous Swallow Assessment: BSE (09/14/2018; 05/29/2011), MBS (01/20/2008 and 01/29/2008) Diet Prior to this Study: NPO Temperature Spikes Noted: No Respiratory Status: Nasal cannula History of Recent Intubation: Yes Length of Intubations (days): 1 days Date extubated: 04/26/20 Behavior/Cognition: Alert;Cooperative;Pleasant mood Oral Cavity Assessment: Within Functional Limits Oral Care Completed by SLP: No Oral Cavity - Dentition: Missing dentition Vision: Functional for self-feeding Self-Feeding Abilities: Able to feed self Patient Positioning: Upright in bed Baseline Vocal Quality: Low vocal intensity Volitional Cough: Cognitively unable to elicit Volitional Swallow: Able to elicit    Oral/Motor/Sensory Function Overall Oral Motor/Sensory Function: Mild impairment Facial ROM: Reduced right;Suspected CN VII (facial) dysfunction Facial Symmetry: Abnormal symmetry right;Suspected CN VII (facial) dysfunction Facial Strength: Reduced right;Suspected CN VII (facial) dysfunction Facial Sensation: Reduced right;Suspected CN V (Trigeminal) dysfunction Lingual ROM:  (Unable to assess due to difficulty following commands) Lingual Symmetry:  (Unable to assess due to difficulty following commands) Lingual Strength:  (Unable to assess due to difficulty following commands) Lingual Sensation:  (Unable to assess due to difficulty following commands) Velum:  (Unable to assess due to difficulty following commands) Mandible: Within Functional Limits   Ice Chips Ice chips: Within functional limits Presentation: Spoon   Thin Liquid Thin  Liquid: Impaired Presentation: Self Fed;Cup;Straw Oral Phase Functional Implications: Left anterior spillage;Right anterior spillage    Nectar Thick Nectar Thick Liquid: Not tested   Honey Thick Honey Thick Liquid: Not tested   Puree Puree: Within functional limits Presentation: Self Fed;Spoon   Solid     Solid: Within functional limits Presentation: [place]., SLP Student 04/27/2020,11:08 AM

## 2020-04-28 ENCOUNTER — Inpatient Hospital Stay (HOSPITAL_COMMUNITY): Payer: Medicare HMO

## 2020-04-28 DIAGNOSIS — I5022 Chronic systolic (congestive) heart failure: Secondary | ICD-10-CM | POA: Diagnosis not present

## 2020-04-28 DIAGNOSIS — I442 Atrioventricular block, complete: Secondary | ICD-10-CM | POA: Diagnosis not present

## 2020-04-28 LAB — BPAM RBC
Blood Product Expiration Date: 202204262359
Blood Product Expiration Date: 202204262359
Blood Product Expiration Date: 202204272359
Blood Product Expiration Date: 202204292359
ISSUE DATE / TIME: 202204041448
ISSUE DATE / TIME: 202204041448
ISSUE DATE / TIME: 202204041618
Unit Type and Rh: 5100
Unit Type and Rh: 5100
Unit Type and Rh: 5100
Unit Type and Rh: 5100

## 2020-04-28 LAB — GLUCOSE, CAPILLARY
Glucose-Capillary: 89 mg/dL (ref 70–99)
Glucose-Capillary: 92 mg/dL (ref 70–99)
Glucose-Capillary: 154 mg/dL — ABNORMAL HIGH (ref 70–99)
Glucose-Capillary: 97 mg/dL (ref 70–99)
Glucose-Capillary: 106 mg/dL — ABNORMAL HIGH (ref 70–99)

## 2020-04-28 LAB — TYPE AND SCREEN
ABO/RH(D): O POS
Antibody Screen: NEGATIVE
Unit division: 0
Unit division: 0
Unit division: 0
Unit division: 0

## 2020-04-28 LAB — CBC
HCT: 28.9 % — ABNORMAL LOW (ref 36.0–46.0)
Hemoglobin: 9.6 g/dL — ABNORMAL LOW (ref 12.0–15.0)
MCH: 29.4 pg (ref 26.0–34.0)
MCHC: 33.2 g/dL (ref 30.0–36.0)
MCV: 88.4 fL (ref 80.0–100.0)
Platelets: 132 10*3/uL — ABNORMAL LOW (ref 150–400)
RBC: 3.27 MIL/uL — ABNORMAL LOW (ref 3.87–5.11)
RDW: 14.6 % (ref 11.5–15.5)
WBC: 13.1 10*3/uL — ABNORMAL HIGH (ref 4.0–10.5)
nRBC: 0 % (ref 0.0–0.2)

## 2020-04-28 LAB — COOXEMETRY PANEL
Carboxyhemoglobin: 1.3 % (ref 0.5–1.5)
Methemoglobin: 0.7 % (ref 0.0–1.5)
O2 Saturation: 59.5 %
Total hemoglobin: 10.2 g/dL — ABNORMAL LOW (ref 12.0–16.0)

## 2020-04-28 LAB — BASIC METABOLIC PANEL
Anion gap: 6 (ref 5–15)
BUN: 13 mg/dL (ref 8–23)
CO2: 27 mmol/L (ref 22–32)
Calcium: 8.2 mg/dL — ABNORMAL LOW (ref 8.9–10.3)
Chloride: 100 mmol/L (ref 98–111)
Creatinine, Ser: 0.77 mg/dL (ref 0.44–1.00)
GFR, Estimated: >60 mL/min (ref >60)
Glucose, Bld: 93 mg/dL (ref 70–99)
Potassium: 4.2 mmol/L (ref 3.5–5.1)
Sodium: 133 mmol/L — ABNORMAL LOW (ref 135–145)

## 2020-04-28 LAB — PROTIME-INR
INR: 1.3 — ABNORMAL HIGH (ref 0.8–1.2)
Prothrombin Time: 15.2 seconds (ref 11.4–15.2)

## 2020-04-28 MED ORDER — FUROSEMIDE 10 MG/ML IJ SOLN: 40.0000 mg | Freq: Every day | INTRAMUSCULAR | Status: DC

## 2020-04-28 NOTE — Progress Notes (Addendum)
Patient ID: [name], female   DOB: 10/02/1950, 70 y.o.   MRN: [id number]     Advanced Heart Failure Rounding Note  PCP-Cardiologist: Skeet Latch, MD  AHF: Dr. Aundra Dubin   Subjective:   - Admitted for CHB.  - Echo with EF 35-40%, inferior and lateral HK, normal RV.  - Coronary angiography: 60-70% dLM, 60-70% ostial LCx, 99% proximal calcified LCx (large/codominant LCx) - 4/4  S/p CABG x 4 (LIMA-LAD, SVG-OM2) - 4/5 Extubated   Now off NE. On Milrinone 0.125. Co-ox 60%.   - 1.8L in UOP yesterday. Wt up 1 lb. CVP 4-5 but remains above pre-op wt.  AF. WBC stable 13.8>>13.1. CXR today w/ persistent bibasilar infiltrates/edema and bilateral pleural effusions.   Remains pacer dependent,  in CHB, A-V paced.   Sitting up in bed. Interactive. No distress.   Objective:   Weight Range: 60.1 kg Body mass index is 20.75 kg/m.   Vital Signs:   Temp:  [98.5 F (36.9 C)-100.2 F (37.9 C)] 99.9 F (37.7 C) (04/07 0726) Pulse Rate:  [87-187] 89 (04/07 0700) Resp:  [16-31] 23 (04/07 0700) BP: (89-131)/(34-91) 103/34 (04/07 0700) SpO2:  [77 %-100 %] 95 % (04/07 0700) Weight:  [60.1 kg] 60.1 kg (04/07 0500) Last BM Date: 04/19/20  Weight change: Filed Weights   04/26/20 0500 04/27/20 0500 04/28/20 0500  Weight: 61.4 kg 59.8 kg 60.1 kg    Intake/Output:   Intake/Output Summary (Last 24 hours) at 04/28/2020 0742 Last data filed at 04/28/2020 0600 Gross per 24 hour  Intake 837.9 ml  Output 1760 ml  Net -922.1 ml      Physical Exam    CVP 4-5  General:  Well appearing. Sitting up in bed. No distress. Alert and interactive but aphasic  HEENT: normal dentition in poor repair  Neck: supple. JVD not well visualized. Carotids 2+ bilat; no bruits. No lymphadenopathy or thyromegaly appreciated. Cor: PMI nondisplaced. Regular rate & rhythm. No rubs, gallops or murmurs. + sternal surgical dressing   Lungs:  Decreased BS at the bases  Abdomen: soft, nontender, nondistended.  No hepatosplenomegaly. No bruits or masses. Good bowel sounds. Extremities: no cyanosis, clubbing, rash, edema Neuro: awake, alert and interactive, aphasic     Telemetry   A-V Paced 80s, CHB  (personally reviewed)  Labs    CBC Recent Labs    04/27/20 0359 04/28/20 0436  WBC 13.8* 13.1*  HGB 9.6* 9.6*  HCT 28.4* 28.9*  MCV 86.1 88.4  PLT 120* 147*   Basic Metabolic Panel Recent Labs    04/26/20 0117 04/26/20 0811 04/26/20 1555 04/27/20 0359 04/28/20 0436  NA 136   < > 137 137 133*  K 4.1   < > 4.3 3.5 4.2  CL 105  --  106 106 100  CO2 22  --  25 26 27   GLUCOSE 170*  --  139* 79 93  BUN 13  --  13 14 13   CREATININE 0.90  --  1.06* 0.86 0.77  CALCIUM 8.7*  --  8.2* 8.0* 8.2*  MG 2.9*  --  2.2  --   --    < > = values in this interval not displayed.   Liver Function Tests No results for input(s): AST, ALT, ALKPHOS, BILITOT, PROT, ALBUMIN in the last 72 hours. No results for input(s): LIPASE, AMYLASE in the last 72 hours. Cardiac Enzymes No results for input(s): CKTOTAL, CKMB, CKMBINDEX, TROPONINI in the last 72 hours.  BNP: BNP (last 3 results) No  results for input(s): BNP in the last 8760 hours.  ProBNP (last 3 results) No results for input(s): PROBNP in the last 8760 hours.   D-Dimer No results for input(s): DDIMER in the last 72 hours. Hemoglobin A1C No results for input(s): HGBA1C in the last 72 hours. Fasting Lipid Panel No results for input(s): CHOL, HDL, LDLCALC, TRIG, CHOLHDL, LDLDIRECT in the last 72 hours. Thyroid Function Tests No results for input(s): TSH, T4TOTAL, T3FREE, THYROIDAB in the last 72 hours.  Invalid input(s): FREET3  Other results:   Imaging    DG Chest Port 1 View  Result Date: 04/28/2020 CLINICAL DATA:  Sore chest.  Status post open heart surgery. EXAM: PORTABLE CHEST 1 VIEW COMPARISON:  04/27/2020. FINDINGS: Right IJ sheath in stable position. Prior CABG. Stable cardiomegaly. Persistent bibasilar pulmonary  infiltrates/edema and bilateral pleural effusions. No pneumothorax. IMPRESSION: 1.  Right IJ sheath in stable position. 2. Prior CABG. Stable cardiomegaly. Persistent bibasilar infiltrates/edema and bilateral pleural effusions again noted. Electronically Signed   By: Marcello Moores  Register   On: 04/28/2020 06:24     Medications:     Scheduled Medications: . sodium chloride   Intravenous Once  . acetaminophen  1,000 mg Oral Q6H   Or  . acetaminophen (TYLENOL) oral liquid 160 mg/5 mL  1,000 mg Per Tube Q6H  . aspirin EC  325 mg Oral Daily   Or  . aspirin  324 mg Per Tube Daily  . atorvastatin  80 mg Oral QHS  . baclofen  20 mg Oral BID  . bisacodyl  10 mg Oral Daily   Or  . bisacodyl  10 mg Rectal Daily  . Chlorhexidine Gluconate Cloth  6 each Topical Daily  . Chlorhexidine Gluconate Cloth  6 each Topical Daily  . docusate sodium  200 mg Oral Daily  . enoxaparin (LOVENOX) injection  40 mg Subcutaneous QHS  . furosemide  40 mg Intravenous BID  . insulin aspart  0-9 Units Subcutaneous TID WC  . lacosamide  100 mg Oral BID  . levETIRAcetam  1,500 mg Oral BID  . mouth rinse  15 mL Mouth Rinse BID  . pantoprazole  40 mg Oral Daily  . sodium chloride flush  3 mL Intravenous Q12H    Infusions: . sodium chloride    . sodium chloride    . sodium chloride    . dexmedetomidine (PRECEDEX) IV infusion Stopped (04/26/20 0615)  . lactated ringers    . lactated ringers 20 mL/hr at 04/27/20 1900  . milrinone 0.125 mcg/kg/min (04/27/20 1900)  . norepinephrine (LEVOPHED) Adult infusion 4 mcg/min (04/27/20 0030)    PRN Medications: sodium chloride, midazolam, morphine injection, ondansetron (ZOFRAN) IV, oxyCODONE, polyethylene glycol, sodium chloride flush, traMADol   Assessment/Plan   1. Complete heart block: Patient had baseline NSR with LAFB/RBBB, so there was underlying conduction system disease.  Suspect this progressed to CHB with beta blocker/digoxin use + chronic ischemia in territory  of large  co-dominant LCx.  - Off nodal blockers.  - remains in CHB despite revascularization, s/p CABG - EP following, planning BiV pacer tomorrow  2. Chronic systolic CHF: Most recent prior echo in 8/20 with EF 35-40% and wall motion abnormalities.  Ischemic cardiomyopathy with left main/severe codominant LCx disease.  Echo this admission with stable EF 35-40%, inferior and lateral hypokinesis.  Not significantly volume overloaded on exam. Per her husband, at baseline she is aphasic but can walk around the house and do her ADLs. Now s/p CABG 4/4. Off NE. On  milrinone 0.125, Co-ox 60%. CVP 4-5 but remains above pre-op wt.  - Stop milrinone  - Continue IV Lasix. Reduce to once daily 40 mg  - Hold digoxin and Coreg with CHB.  - Hold Entresto with soft BP.  3. 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  Has been on warfarin.  Has had long-standing aphasia and right hemiparesis.  Can walk around house and do ADLs.  - Warfarin on hold. Resume anticoagulation once ok w/ CT surgery   4. H/o seizure disorder: Continue Keppra.  5. ?Sickle cell anemia: Carries this diagnosis but not sure truly present.  6. CAD:  LHC showed 60-70% distal left main stenosis, heavily calcified proximal LCx with 60-70% stenosis at the ostium and discrete 99% stenosis in the proximal LCx.  I suspect that her CHB is related to chronic ischemia from LM-LCx disease worsened by nodal blockade.  Difficult situation.  She has surgical disease but will be more difficult to rehab after CABG with weakness from prior stroke though per her husband, she does all ADLs and walks with cane at home.  PCI would be possible but high risk/difficult heavy calcification and tortuousity. Had CABG x 2 4/4 w/ LIMA-LAD, SVG-OM2  - Continue ASA 81, statin.  7. ID: WBC stable at 13K. Currently AF. CXR w/ persistent bibasilar infiltrates/edema and bilateral pleural effusions. - continue IV Lasix -? Check PCT    Length of Stay: [address] PA-C   04/28/2020, 7:42 AM  Advanced Heart Failure Team Pager [phone] (M-F; 7a - 5p)  Please contact [facility] for night-coverage after hours (5p -7a ) and weekends on [email or web address]  Patient seen with PA, agree with the above note.    She remains in 3rd degree HB underlying her pacing wires.  No complaints, CVP 4-5 though weight still above baseline.  Co-ox 60%.  Stable creatinine.  She is off NE.   General: NAD Neck: No JVD, no thyromegaly or thyroid nodule.  Lungs: Clear to auscultation bilaterally with normal respiratory effort. CV: Nondisplaced PMI.  Heart regular S1/S2, no S3/S4, no murmur.  No peripheral edema.   Abdomen: Soft, nontender, no hepatosplenomegaly, no distention.  Skin: Intact without lesions or rashes.  Neurologic: Alert and oriented x 3.  Psych: Normal affect. Extremities: No clubbing or cyanosis.  HEENT: Normal.   Stop milrinone today.  Will give Lasix 40 mg IV x 1 today.   CXR with atelectasis.  Needs to use IS and needs to mobilize.   Still with CHB, discussed with EP and plan PPM tomorrow.  Will get echo today, would favor BiV pacing if EF remains low.   CRITICAL CARE Performed by: Loralie Champagne  Total critical care time: 35 minutes  Critical care time was exclusive of separately billable procedures and treating other patients.  Critical care was necessary to treat or prevent imminent or life-threatening deterioration.  Critical care was time spent personally by me on the following activities: development of treatment plan with patient and/or surrogate as well as nursing, discussions with consultants, evaluation of patient's response to treatment, examination of patient, obtaining history from patient or surrogate, ordering and performing treatments and interventions, ordering and review of laboratory studies, ordering and review of radiographic studies, pulse oximetry and re-evaluation of patient's condition.  Loralie Champagne 04/28/2020 9:16 AM

## 2020-04-28 NOTE — Progress Notes (Signed)
Physical Therapy Treatment Patient Details Name: [name] MRN: [id number] DOB: [birth year] Today's Date: 04/28/2020    History of Present Illness 70 y.o. female admitted 3/31 after syncope at home and found to have complete heart block. Pt s/p CABG x 2 4/4. PMhx: HTN, HLD, stroke (expressive aphasia and R sided deficit), sickle cell anemia, seizure d/o, chronic HF (systolic).    PT Comments    Pt pleasant and nodding and smiling. Pt able to transition to EOB and chair with pt setup for breakfast end of session. Pt normally walks with quad cane without bracing for RLE but clearly very reliant on LUE for heavy pushing and pulling to transition for transfers and gait with pt demonstrating lack of awareness of precautions despite education throughout session. Pt will need continued therapy and increased assist given current deficits and discussed with spouse.   HR 90 BP 130/61 (82)    Follow Up Recommendations  SNF;Supervision/Assistance - 24 hour     Equipment Recommendations       Recommendations for Other Services       Precautions / Restrictions Precautions Precautions: Sternal;Fall Precaution Comments: aphasia, right hemiparesis with contracted RUE, foot drop and extension of RLE, external pacer Restrictions Weight Bearing Restrictions: Yes    Mobility  Bed Mobility Overal bed mobility: Needs Assistance Bed Mobility: Rolling;Sidelying to Sit Rolling: Min assist Sidelying to sit: Min assist       General bed mobility comments: min assist with max cues for sequence to roll to right and transition from side to sitting good use of engaging core to rise    Transfers Overall transfer level: Needs assistance   Transfers: Sit to/from Stand;Stand Pivot Transfers Sit to Stand: Mod assist Stand pivot transfers: Mod assist;+2 safety/equipment       General transfer comment: pt stating she can walk at home and spouse confirmed via phone. Stood initially with pt  with max cues and mod assist to rise with posterior lean and unable to place right foot on the floor. With sternal precautions unable to step. Sat then repositioned chair to left with additional stand and pivot with assist of belt to stand and pivot with +2 assist for safety and guiding hips. Max +2 to scoot back in chair  Ambulation/Gait             General Gait Details: unable   Stairs             Wheelchair Mobility    Modified Rankin (Stroke Patients Only)       Balance Overall balance assessment: Needs assistance Sitting-balance support: Feet supported Sitting balance-Leahy Scale: Fair Sitting balance - Comments: EOb with guarding   Standing balance support: Single extremity supported Standing balance-Leahy Scale: Poor Standing balance comment: single UE and support of belt to stand                            Cognition Arousal/Alertness: Awake/alert Behavior During Therapy: WFL for tasks assessed/performed Overall Cognitive Status: Difficult to assess                                 General Comments: pt pointing at floor in room without anything there. Pt educated for sternal precautions but does not demonstrate carry over as immediately attempting to push and pull for transfers      Exercises      General Comments  Pertinent Vitals/Pain Pain Assessment: No/denies pain Faces Pain Scale: No hurt    Home Living Family/patient expects to be discharged to:: Private residence Living Arrangements: Spouse/significant other Available Help at Discharge: Family;Available 24 hours/day Type of Home: House Home Access: Ramped entrance   Home Layout: One level Home Equipment: Cane - quad;Wheelchair - manual      Prior Function Level of Independence: Independent with assistive device(s)  Gait / Transfers Assistance Needed: Quad cane for mobility.  Wheelchair for community mobility ADL's / Homemaking Assistance Needed: spouse  reports she was bathing and dressing on her own except for LUE. Has an AFO but doesn't use it and spouse usually ace wraps her right foot     PT Goals (current goals can now be found in the care plan section) Progress towards PT goals: Progressing toward goals    Frequency    Min 3X/week      PT Plan Current plan remains appropriate    Co-evaluation              AM-PAC PT "6 Clicks" Mobility   Outcome Measure  Help needed turning from your back to your side while in a flat bed without using bedrails?: A Little Help needed moving from lying on your back to sitting on the side of a flat bed without using bedrails?: A Lot Help needed moving to and from a bed to a chair (including a wheelchair)?: A Lot Help needed standing up from a chair using your arms (e.g., wheelchair or bedside chair)?: A Lot Help needed to walk in hospital room?: Total Help needed climbing 3-5 steps with a railing? : Total 6 Click Score: 11    End of Session Equipment Utilized During Treatment: Gait belt Activity Tolerance: Patient tolerated treatment well Patient left: in chair;with call bell/phone within reach;with nursing/sitter in room;with chair alarm set Nurse Communication: Mobility status PT Visit Diagnosis: Other abnormalities of gait and mobility (R26.89);Difficulty in walking, not elsewhere classified (R26.2);Hemiplegia and hemiparesis Hemiplegia - Right/Left: Right Hemiplegia - caused by: Cerebral infarction     Time: 7829-5621 PT Time Calculation (min) (ACUTE ONLY): 16 min  Charges:  $Therapeutic Activity: 8-22 mins                     [name] P, PT Acute Rehabilitation Services Pager: [phone] Office: [place] Alexya Mcdaris 04/28/2020, 11:03 AM

## 2020-04-28 NOTE — Progress Notes (Signed)
EVENING ROUNDS NOTE :     [address]       [place] [address]             [phone]                 3 Days Post-Op Procedure(s) (LRB): CORONARY ARTERY BYPASS GRAFTING (CABG) TIMES TWO USING LEFT INTERNAL MAMMARY ARTERY AND RIGHT GREATER SAPHENOUS VEIN HARVESTED ENDOSCOPICALLY (N/A) TRANSESOPHAGEAL ECHOCARDIOGRAM (TEE)   Total Length of Stay:  LOS: 7 days  Events:   No events  Up in bed Cleared for dysphagia 3 diet and thin liquids   BP 110/64 (BP Location: Left Arm)   Pulse 89   Temp 99.1 F (37.3 C) (Oral)   Resp (!) 31   Ht 5\' 7"  (1.702 m)   Wt 60.1 kg   SpO2 98%   BMI 20.75 kg/m   CVP:  [6 mmHg] 6 mmHg     . sodium chloride    . sodium chloride    . sodium chloride    . lactated ringers    . lactated ringers Stopped (04/28/20 0801)  . norepinephrine (LEVOPHED) Adult infusion 4 mcg/min (04/27/20 0030)    I/O last 3 completed shifts: In: 1480.5 [I.V.:903.1; IV Piggyback:577.4] Out: 2495 [Urine:2495]   CBC Latest Ref Rng & Units 04/28/2020 04/27/2020 04/26/2020  WBC 4.0 - 10.5 K/uL 13.1(H) 13.8(H) 11.0(H)  Hemoglobin 12.0 - 15.0 g/dL 9.6(L) 9.6(L) 9.9(L)  Hematocrit 36.0 - 46.0 % 28.9(L) 28.4(L) 29.5(L)  Platelets 150 - 400 K/uL 132(L) 120(L) 117(L)    BMP Latest Ref Rng & Units 04/28/2020 04/27/2020 04/26/2020  Glucose 70 - 99 mg/dL 93 79 139(H)  BUN 8 - 23 mg/dL 13 14 13   Creatinine 0.44 - 1.00 mg/dL 0.77 0.86 1.06(H)  BUN/Creat Ratio 12 - 28 - - -  Sodium 135 - 145 mmol/L 133(L) 137 137  Potassium 3.5 - 5.1 mmol/L 4.2 3.5 4.3  Chloride 98 - 111 mmol/L 100 106 106  CO2 22 - 32 mmol/L 27 26 25   Calcium 8.9 - 10.3 mg/dL 8.2(L) 8.0(L) 8.2(L)    ABG    Component Value Date/Time   PHART 7.378 04/26/2020 0934   PCO2ART 37.4 04/26/2020 0934   PO2ART 124 (H) 04/26/2020 0934   HCO3 21.9 04/26/2020 0934   TCO2 23 04/26/2020 0934   ACIDBASEDEF 3.0 (H) 04/26/2020 0934   O2SAT 59.5 04/28/2020 0436       Melodie Bouillon, MD 04/28/2020  5:15 PM

## 2020-04-28 NOTE — Progress Notes (Addendum)
[facility] DAILY ICU PROGRESS NOTE                   [address]            ,[place] [address]          [phone]   3 Days Post-Op Procedure(s) (LRB): CORONARY ARTERY BYPASS GRAFTING (CABG) TIMES TWO USING LEFT INTERNAL MAMMARY ARTERY AND RIGHT GREATER SAPHENOUS VEIN HARVESTED ENDOSCOPICALLY (N/A) TRANSESOPHAGEAL ECHOCARDIOGRAM (TEE)  Total Length of Stay:  LOS: 7 days   Subjective:  Awakens easily, alert and responsive. Denies pain.  No events over night.  Remains on RA, Milrinone at 0.18mcg.kg.min, CoOc 59  Objective: Vital signs in last 24 hours: Temp:  [98.5 F (36.9 C)-100.2 F (37.9 C)] 99.2 F (37.3 C) (04/07 0645) Pulse Rate:  [87-187] 89 (04/07 0700) Cardiac Rhythm: A-V Sequential paced (04/07 0400) Resp:  [16-31] 23 (04/07 0700) BP: (89-131)/(34-91) 103/34 (04/07 0700) SpO2:  [77 %-100 %] 95 % (04/07 0700) Weight:  [60.1 kg] 60.1 kg (04/07 0500)  Filed Weights   04/26/20 0500 04/27/20 0500 04/28/20 0500  Weight: 61.4 kg 59.8 kg 60.1 kg    Weight change: 0.3 kg      Intake/Output from previous day: 04/06 0701 - 04/07 0700 In: 837.9 [I.V.:387.9; IV Piggyback:450] Out: 0998 [Urine:1760]  Intake/Output this shift: No intake/output data recorded.  Current Meds: Scheduled Meds: . sodium chloride   Intravenous Once  . acetaminophen  1,000 mg Oral Q6H   Or  . acetaminophen (TYLENOL) oral liquid 160 mg/5 mL  1,000 mg Per Tube Q6H  . aspirin EC  325 mg Oral Daily   Or  . aspirin  324 mg Per Tube Daily  . atorvastatin  80 mg Oral QHS  . baclofen  20 mg Oral BID  . bisacodyl  10 mg Oral Daily   Or  . bisacodyl  10 mg Rectal Daily  . Chlorhexidine Gluconate Cloth  6 each Topical Daily  . Chlorhexidine Gluconate Cloth  6 each Topical Daily  . docusate sodium  200 mg Oral Daily  . enoxaparin (LOVENOX) injection  40 mg Subcutaneous QHS  . furosemide  40 mg Intravenous BID  . insulin aspart  0-9 Units Subcutaneous TID WC  . lacosamide  100  mg Oral BID  . levETIRAcetam  1,500 mg Oral BID  . mouth rinse  15 mL Mouth Rinse BID  . pantoprazole  40 mg Oral Daily  . sodium chloride flush  3 mL Intravenous Q12H   Continuous Infusions: . sodium chloride    . sodium chloride    . sodium chloride    . dexmedetomidine (PRECEDEX) IV infusion Stopped (04/26/20 0615)  . lactated ringers    . lactated ringers 20 mL/hr at 04/27/20 1900  . milrinone 0.125 mcg/kg/min (04/27/20 1900)  . norepinephrine (LEVOPHED) Adult infusion 4 mcg/min (04/27/20 0030)   PRN Meds:.sodium chloride, midazolam, morphine injection, ondansetron (ZOFRAN) IV, oxyCODONE, polyethylene glycol, sodium chloride flush, traMADol  General appearance: alert, cooperative and no distress Neurologic: baseline aphasia, right hemiplegia Heart: A-V paced. Lungs: Breath sounds clear anterior Abdomen: soft, NT Extremities: SCD's to LE's Wound: The right LE EVH incision is intact and dry. A dry Aquacel dressing covers the sternal incision  Lab Results: CBC: Recent Labs    04/27/20 0359 04/28/20 0436  WBC 13.8* 13.1*  HGB 9.6* 9.6*  HCT 28.4* 28.9*  PLT 120* 132*   BMET:  Recent Labs    04/27/20 0359 04/28/20 0436  NA 137 133*  K 3.5 4.2  CL 106 100  CO2 26 27  GLUCOSE 79 93  BUN 14 13  CREATININE 0.86 0.77  CALCIUM 8.0* 8.2*    CMET: Lab Results  Component Value Date   WBC 13.1 (H) 04/28/2020   HGB 9.6 (L) 04/28/2020   HCT 28.9 (L) 04/28/2020   PLT 132 (L) 04/28/2020   GLUCOSE 93 04/28/2020   CHOL 124 04/22/2020   TRIG 94 04/22/2020   HDL 41 04/22/2020   LDLCALC 64 04/22/2020   ALT 15 04/21/2020   AST 28 04/21/2020   NA 133 (L) 04/28/2020   K 4.2 04/28/2020   CL 100 04/28/2020   CREATININE 0.77 04/28/2020   BUN 13 04/28/2020   CO2 27 04/28/2020   TSH 1.846 10/23/2013   INR 1.3 (H) 04/28/2020   HGBA1C 6.1 (H) 04/22/2020      PT/INR:  Recent Labs    04/28/20 0436  LABPROT 15.2  INR 1.3*   Radiology: [facility] Chest Port 1 View  Result  Date: 04/28/2020 CLINICAL DATA:  Sore chest.  Status post open heart surgery. EXAM: PORTABLE CHEST 1 VIEW COMPARISON:  04/27/2020. FINDINGS: Right IJ sheath in stable position. Prior CABG. Stable cardiomegaly. Persistent bibasilar pulmonary infiltrates/edema and bilateral pleural effusions. No pneumothorax. IMPRESSION: 1.  Right IJ sheath in stable position. 2. Prior CABG. Stable cardiomegaly. Persistent bibasilar infiltrates/edema and bilateral pleural effusions again noted. Electronically Signed   By: Marcello Moores  Register   On: 04/28/2020 06:24     Assessment/Plan: S/P Procedure(s) (LRB): CORONARY ARTERY BYPASS GRAFTING (CABG) TIMES TWO USING LEFT INTERNAL MAMMARY ARTERY AND RIGHT GREATER SAPHENOUS VEIN HARVESTED ENDOSCOPICALLY (N/A) TRANSESOPHAGEAL ECHOCARDIOGRAM (TEE)  -POD3 CABG x 2, pre-op EF 35%. Stable hemodynamics. Heart failure team is following and managing milrinone and diuretics.  -Complete heart block- present on admission and has not improved despite revascularization. EP team planning for PPM.  -Volume excess- Wt ~5kg+, on  Lasix 40mg  IV BID.   -Expected acute blood loss anemia and thrombocytopenia- Hct is stable, platelet count is trending up.   -Endo-No h/o DM, glucose well controlled.  -Remote h/o presumed cardio-embolic CVA with residual aphasia and right hemiplegia-PT and OT evaluations reviewed- recommendations for SNF at discharge.   -DVT PPX- SCD's + SQ enoxaparin.      Antony Odea, PA-C [phone] 04/28/2020 7:25 AM  Patient seen and examined, d/w Mr. Michael Litter and Dr. Aundra Dubin of AHF team Dc milrinone Dc central line CXR shows bibasilar atelectasis + effusions- hard to tell for sure but I think primarily atelectasis  IS and flutter valves CHB persists- for PPM tomorrow  Remo Lipps C. Roxan Hockey, MD [facility] [phone]

## 2020-04-28 NOTE — Progress Notes (Signed)
  Speech Language Pathology Treatment: Dysphagia  Patient Details Name: [name] [place] MRN: [id number] DOB: 1950-12-07 Today's Date: 04/28/2020 Time: 2035-5974 SLP Time Calculation (min) (ACUTE ONLY): 15 min  Assessment / Plan / Recommendation Clinical Impression  Pt seen sitting upright in chair following session with PT. She consumed her breakfast tray with no overt s/s of aspiration and participated in independent self-feeding. Nursing did not express anymore concerns or complaints of coughing compared to when her evaluation was completed on the previous day. Overall, her oropharyngeal swallow appears both functional and safe. She is also afebrile, her breath sounds have been clear and diminished, and her CXR from today was unremarkable for potential infection. Given that she conveyed that she eats softer textures, recommend that she remain on her current diet and SLP will sign off.    HPI HPI: Pt is a 70 y.o. female who presented with a syncopal episode and was found to have a complete heart block. Intubated from 4/4 to 4/5. CXR 4/5: Cardiomegaly with bilateral interstitial prominence and bilateral pleural effusions again noted; findings consistent CHF; persistent bibasilar atelectasis. Most recent BSE occured on 09/14/2018 and she was discharged on DYS 3 diet with thin liquids. Previous L MCA in 2009 which resulted in residual aphasia and right hemiplegia. PMH signifiacnt for seizures, HTN, CHF.      SLP Plan  All goals met       Recommendations  Diet recommendations: Dysphagia 3 (mechanical soft);Thin liquid Liquids provided via: Cup;Straw Medication Administration: Whole meds with puree Supervision: Patient able to self feed;Intermittent supervision to cue for compensatory strategies Compensations: Slow rate;Small sips/bites Postural Changes and/or Swallow Maneuvers: Seated upright 90 degrees                Oral Care Recommendations: Oral care BID Follow up  Recommendations: None SLP Visit Diagnosis: Dysphagia, unspecified (R13.10) Plan: All goals met       GO                Jeanine Luz., SLP Student 04/28/2020, 10:59 AM

## 2020-04-28 NOTE — Progress Notes (Signed)
[facility] for Warfarin > Heparin Indication: stroke  Allergies  Allergen Reactions  . Lexiscan [Regadenoson] Other (See Comments)    Perioral tingling and throat tightness    Patient Measurements: Height: 5\' 7"  (170.2 cm) Weight: 60.1 kg (132 lb 7.9 oz) IBW/kg (Calculated) : 61.6 Heparin Dosing Weight: 57 kg  Vital Signs: Temp: 99.5 F (37.5 C) (04/07 1114) Temp Source: Oral (04/07 1114) BP: 110/64 (04/07 1200) Pulse Rate: 89 (04/07 1200)  Labs: Recent Labs    04/25/20 1751 04/25/20 1754 04/26/20 1555 04/27/20 0359 04/28/20 0436  HGB 11.5*   < > 9.9* 9.6* 9.6*  HCT 34.1*   < > 29.5* 28.4* 28.9*  PLT 106*   < > 117* 120* 132*  APTT 36  --   --   --   --   LABPROT 19.0*  --   --   --  15.2  INR 1.7*  --   --   --  1.3*  CREATININE  --    < > 1.06* 0.86 0.77   < > = values in this interval not displayed.    Estimated Creatinine Clearance: 61.2 mL/min (by C-G formula based on SCr of 0.77 mg/dL).   Medical History: Past Medical History:  Diagnosis Date  . Adenomatous polyp of colon 09/2012   repeat colonoscopy in 5 years by Dr. Sharlett Iles  . CHF (congestive heart failure) (HCC)    EF 15-20% as of 05/28/11 (Dr Legrand Como [facility] D/[facility] summary)  . CVA (cerebral infarction) 12/2007  . Hemiparesis ([place])   . Hyperlipidemia   . Hypertension   . Seizures ([place])   . Sickle cell anemia (HCC)   . Stroke ([place])   . Vitamin D deficiency 2010    Assessment: 70 yo female presents with CHB now s/p temporary pacer. On warfarin PTA for hx embolic stroke, no known hx of afib. INR on admit therapeutic at 2.2. LHC on 4/1 shows LM-LCx disease, PCI held for CABG consult. Pharmacy asked start heparin 8 hr after sheath pull and when INR <2.   PTA warfarin regimen: 5 mg daily except 7.5 mg Mon/Thurs INR on admit: 2.2  Warfarin and heparin held post CABG.  Discussion to resume warfarin but CHB and possible pacer placement.  Will await plan from  EP prior to resuming warfarin    Goal of Therapy: Heparin level 0.3-0.7  INR 2-3 Monitor platelets by anticoagulation protocol: Yes   Plan:  Continue VTE px enoxaparin 40mg  daily and ASA 325mg  daily post op for now    [employer].D. CPP, BCPS Clinical Pharmacist [phone] 04/28/2020 1:15 PM

## 2020-04-28 NOTE — Progress Notes (Signed)
  Following for likely PPM tomorrow. Possible BiV pending Echo.   Remains in underlying CHB today. Milrinone and NE have been weaned off.   WBC 13.8 -> 13.1. Tmax 100. Will also need to trend this tomorrow.   Will make NPO after midnight.   Legrand Como [address]" [place], PA-C  04/28/2020 2:02 PM

## 2020-04-28 NOTE — NC FL2 (Signed)
[place] LEVEL OF CARE SCREENING TOOL     IDENTIFICATION  Patient Name: Thena Devora [place] Birthdate: May 10, 1950 Sex: female Admission Date (Current Location): 04/21/2020  [facility] and [place] Number:  Herbalist and Address:  [facility] [place]. [facility], [place] [address], [place], [place] [address]      Provider Number: [id number]  Attending Physician Name and Address:  Melrose Nakayama, MD  Relative Name and Phone Number:  [place]ks [place] [phone]    Current Level of Care: Hospital Recommended Level of Care: [employer] Prior Approval Number:    Date Approved/Denied:   PASRR Number: [id number]  Discharge Plan: SNF    Current Diagnoses: Patient Active Problem List   Diagnosis Date Noted  . Third degree heart block ([place])   . Complete heart block ([place]) 04/21/2020  . Bleeding tendency ([place]) 12/14/2019  . Encounter for monitoring digoxin therapy 04/09/2019  . Status epilepticus ([place])   . Chronic combined systolic and diastolic heart failure ([place])   . Demand ischemia ([place])   . Acquired right foot drop 01/12/2017  . Osteoporosis 12/03/2014  . Vitamin D deficiency 12/19/2012  . Dysarthria as late effect of cerebrovascular accident (CVA) 12/12/2012  . Gait instability 12/12/2012  . Contracture of joint 12/12/2012  . Hemiplegia of dominant side as late effect following cerebrovascular disease ([place]) 12/12/2012  . Anticoagulated on Coumadin 09/19/2011  . Secondary cardiomyopathy ([place]) 08/24/2011  . (L) MCA cardio embolic stroke  68/34/1962  . H/O tobacco use 06/25/2011  . Weakness 05/28/2011  . History of CVA (cerebrovascular accident) 05/28/2011  . HTN (hypertension) 05/28/2011  . HLD (hyperlipidemia) 05/28/2011  . Seizure disorder ([place]) 05/28/2011    Orientation RESPIRATION BLADDER Height & Weight     Self,Time,Situation,Place  Normal Incontinent,External catheter (External Urinary Catheter) Weight: 132 lb 7.9 oz  (60.1 kg) Height:  5\' 7"  (170.2 cm)  BEHAVIORAL SYMPTOMS/MOOD NEUROLOGICAL BOWEL NUTRITION STATUS      Continent Diet (See Discharge Summary)  AMBULATORY STATUS COMMUNICATION OF NEEDS Skin   Extensive Assist  (difficulty speaking) Other (Comment) (Inc. Closed Chest Other,silver hydrofiber,clean,dry,intact,dressing in place,Inc.Closed Leg Right,clean,dry,attached edges,skin glue)                       Personal Care Assistance Level of Assistance  Bathing,Feeding,Dressing Bathing Assistance: Limited assistance Feeding assistance: Independent Dressing Assistance: Limited assistance     Functional Limitations Info  Sight,Hearing,Speech   Hearing Info: Adequate Speech Info: Impaired (Expressive Aphasia)    SPECIAL CARE FACTORS FREQUENCY  PT (By licensed PT),OT (By licensed OT)     PT Frequency: 5x min weekly OT Frequency: 5x min weekly            Contractures Contractures Info: Not present    Additional Factors Info  Code Status,Allergies,Insulin Sliding Scale Code Status Info: FULL Allergies Info: Lexiscan (regadenoson)   Insulin Sliding Scale Info: insulin aspart (novoLOG) injection 0-9 Units 3 times daily with meals       Current Medications (04/28/2020):  This is the current hospital active medication list Current Facility-Administered Medications  Medication Dose Route Frequency Provider Last Rate Last Admin  . 0.45 % sodium chloride infusion   Intravenous Continuous PRN Barrett, Erin R, PA-C      . 0.9 %  sodium chloride infusion (Manually program via Guardrails IV Fluids)   Intravenous Once Eligha Bridegroom, CRNA      . 0.9 %  sodium chloride infusion  250 mL Intravenous Continuous Barrett, Junie Panning  R, PA-C      . 0.9 %  sodium chloride infusion   Intravenous Continuous Barrett, Erin R, PA-C      . acetaminophen (TYLENOL) tablet 1,000 mg  1,000 mg Oral Q6H Barrett, Erin R, PA-C   1,000 mg at 04/28/20 0617   Or  . acetaminophen (TYLENOL) 160 MG/5ML solution 1,000 mg   1,000 mg Per Tube Q6H Barrett, Erin R, PA-C   1,000 mg at 04/28/20 1114  . aspirin EC tablet 325 mg  325 mg Oral Daily Barrett, Erin R, PA-C   325 mg at 04/26/20 1100   Or  . aspirin chewable tablet 324 mg  324 mg Per Tube Daily Barrett, Erin R, PA-C   324 mg at 04/28/20 0927  . atorvastatin (LIPITOR) tablet 80 mg  80 mg Oral QHS Melrose Nakayama, MD   80 mg at 04/27/20 2132  . baclofen (LIORESAL) tablet 20 mg  20 mg Oral BID Melrose Nakayama, MD   20 mg at 04/28/20 0931  . bisacodyl (DULCOLAX) EC tablet 10 mg  10 mg Oral Daily Barrett, Erin R, PA-C   10 mg at 04/28/20 0962   Or  . bisacodyl (DULCOLAX) suppository 10 mg  10 mg Rectal Daily Barrett, Erin R, PA-C      . Chlorhexidine Gluconate Cloth 2 % PADS 6 each  6 each Topical Daily Melrose Nakayama, MD   6 each at 04/28/20 1134  . docusate sodium (COLACE) capsule 200 mg  200 mg Oral Daily Barrett, Erin R, PA-C   200 mg at 04/28/20 0927  . enoxaparin (LOVENOX) injection 40 mg  40 mg Subcutaneous QHS Melrose Nakayama, MD   40 mg at 04/27/20 2132  . [START ON 04/29/2020] furosemide (LASIX) injection 40 mg  40 mg Intravenous Daily Simmons, Brittainy M, PA-C      . insulin aspart (novoLOG) injection 0-9 Units  0-9 Units Subcutaneous TID WC Melrose Nakayama, MD   2 Units at 04/28/20 1115  . lacosamide (VIMPAT) tablet 100 mg  100 mg Oral BID Melrose Nakayama, MD   100 mg at 04/28/20 8366  . lactated ringers infusion   Intravenous Continuous Barrett, Erin R, PA-C      . lactated ringers infusion   Intravenous Continuous Barrett, Erin R, PA-C   Stopped at 04/28/20 0801  . levETIRAcetam (KEPPRA) tablet 1,500 mg  1,500 mg Oral BID Melrose Nakayama, MD   1,500 mg at 04/28/20 0934  . MEDLINE mouth rinse  15 mL Mouth Rinse BID Melrose Nakayama, MD   15 mL at 04/28/20 0929  . midazolam (VERSED) injection 2 mg  2 mg Intravenous Q1H PRN Barrett, Erin R, PA-C      . morphine 2 MG/ML injection 1-4 mg  1-4 mg Intravenous Q1H  PRN Barrett, Erin R, PA-C   2 mg at 04/26/20 0150  . norepinephrine (LEVOPHED) 4mg  in 255mL premix infusion  0-40 mcg/min Intravenous Titrated Larey Dresser, MD 15 mL/hr at 04/27/20 0030 4 mcg/min at 04/27/20 0030  . ondansetron (ZOFRAN) injection 4 mg  4 mg Intravenous Q6H PRN Barrett, Erin R, PA-C   4 mg at 04/27/20 0517  . oxyCODONE (Oxy IR/ROXICODONE) immediate release tablet 5-10 mg  5-10 mg Oral Q3H PRN Barrett, Erin R, PA-C      . pantoprazole (PROTONIX) EC tablet 40 mg  40 mg Oral Daily Barrett, Erin R, PA-C   40 mg at 04/28/20 0927  . polyethylene glycol (MIRALAX /  GLYCOLAX) packet 17 g  17 g Oral Daily PRN Barrett, Erin R, PA-C      . sodium chloride flush (NS) 0.9 % injection 3 mL  3 mL Intravenous Q12H Barrett, Erin R, PA-C   3 mL at 04/27/20 2133  . sodium chloride flush (NS) 0.9 % injection 3 mL  3 mL Intravenous PRN Barrett, Erin R, PA-C      . traMADol (ULTRAM) tablet 50-100 mg  50-100 mg Oral Q4H PRN Barrett, Erin R, PA-C   50 mg at 04/26/20 7290     Discharge Medications: Please see discharge summary for a list of discharge medications.  Relevant Imaging Results:  Relevant Lab Results:   Additional Information [phone]  Trula Ore, LCSWA

## 2020-04-28 NOTE — TOC Initial Note (Signed)
Transition of Care [facility]) - Initial/Assessment Note    Patient Details  Name: [name] MRN: [id number] Date of Birth: 11-24-1950  Transition of [facility]) CM/SW Contact:    Trula Ore, [place] Phone Number: 04/28/2020, 2:38 PM  Clinical Narrative:                   CSW received consult for possible SNF placement at time of discharge. CSW spoke with patient at beside regarding PT recommendation of SNF placement at time of discharge.  Patient expressed understanding of PT recommendation and is agreeable to SNF placement at time of discharge. Patient gave CSW permission to fax out initial referral near the [place] area. Patient gave CSW permission to discuss her dc plans with her spouse [name]. Patient has received the COVID vaccines.  No further questions reported at this time.CSW also spoke with patients spouse [name] and updated him on patients dc plans.Patients spouse thanked CSW. CSW to continue to follow and assist with discharge planning needs.  Expected Discharge Plan: Skilled Nursing Facility Barriers to Discharge: Continued Medical Work up   Patient Goals and CMS Choice Patient states their goals for this hospitalization and ongoing recovery are:: to go to SNF CMS Medicare.gov Compare Post Acute Care list provided to:: Patient Choice offered to / list presented to : Patient  Expected Discharge Plan and Services Expected Discharge Plan: [place] In-house Referral: Clinical Social Work     Living arrangements for the past 2 months: Single Family Home                                      Prior Living Arrangements/Services Living arrangements for the past 2 months: Single Family Home Lives with:: Self,Spouse Patient language and need for interpreter reviewed:: Yes Do you feel safe going back to the place where you live?: No   SNF  Need for Family Participation in Patient Care: Yes (Comment) Care giver support system in place?:  Yes (comment)   Criminal Activity/Legal Involvement Pertinent to Current Situation/Hospitalization: No - Comment as needed  Activities of Daily Living Home Assistive Devices/Equipment: Brace (specify type) ADL Screening (condition at time of admission) Patient's cognitive ability adequate to safely complete daily activities?: Yes Is the patient deaf or have difficulty hearing?: No Does the patient have difficulty seeing, even when wearing glasses/contacts?: No Does the patient have difficulty concentrating, remembering, or making decisions?: No Patient able to express need for assistance with ADLs?: Yes Does the patient have difficulty dressing or bathing?: Yes Independently performs ADLs?: No Communication: Needs assistance Is this a change from baseline?: Pre-admission baseline Dressing (OT): Needs assistance Is this a change from baseline?: Pre-admission baseline Grooming: Needs assistance Is this a change from baseline?: Pre-admission baseline Feeding: Independent Bathing: Needs assistance Is this a change from baseline?: Pre-admission baseline Toileting: Needs assistance Is this a change from baseline?: Pre-admission baseline In/Out Bed: Independent Walks in Home: Independent Does the patient have difficulty walking or climbing stairs?: Yes Weakness of Legs: Right Weakness of Arms/Hands: Right  Permission Sought/Granted Permission sought to share information with : Case Manager,Family Chief Financial Officer Permission granted to share information with : Yes, Verbal Permission Granted  Share Information with NAME: [name].  Permission granted to share info w AGENCY: SNF  Permission granted to share info w Relationship: spouse  Permission granted to share info w Contact Information: [name] [phone]  Emotional  Assessment Appearance:: Appears stated age Attitude/Demeanor/Rapport: Gracious Affect (typically observed): Calm Orientation: : Oriented  to Self,Oriented to Place,Oriented to  Time,Oriented to Situation Alcohol / Substance Use: Not Applicable Psych Involvement: No (comment)  Admission diagnosis:  Third degree heart block ([place]) [I44.2] Complete heart block ([place]) [I44.2] Patient Active Problem List   Diagnosis Date Noted  . Third degree heart block ([place])   . Complete heart block ([place]) 04/21/2020  . Bleeding tendency ([place]) 12/14/2019  . Encounter for monitoring digoxin therapy 04/09/2019  . Status epilepticus ([place])   . Chronic combined systolic and diastolic heart failure ([place])   . Demand ischemia ([place])   . Acquired right foot drop 01/12/2017  . Osteoporosis 12/03/2014  . Vitamin D deficiency 12/19/2012  . Dysarthria as late effect of cerebrovascular accident (CVA) 12/12/2012  . Gait instability 12/12/2012  . Contracture of joint 12/12/2012  . Hemiplegia of dominant side as late effect following cerebrovascular disease ([place]) 12/12/2012  . Anticoagulated on Coumadin 09/19/2011  . Secondary cardiomyopathy ([place]) 08/24/2011  . (L) MCA cardio embolic stroke  97/58/8325  . H/O tobacco use 06/25/2011  . Weakness 05/28/2011  . History of CVA (cerebrovascular accident) 05/28/2011  . HTN (hypertension) 05/28/2011  . HLD (hyperlipidemia) 05/28/2011  . Seizure disorder ([place]) 05/28/2011   PCP:  Just, [name], FNP Pharmacy:   [facility], [place] [address] Phone: [phone] Fax: [phone]  [facility] [phone] [place], [place] [address] [place] [address] Phone: [phone] Fax: [phone]     Social Determinants of Health (SDOH) Interventions    Readmission Risk Interventions No flowsheet data found.

## 2020-04-29 ENCOUNTER — Inpatient Hospital Stay (HOSPITAL_COMMUNITY): Payer: Medicare HMO

## 2020-04-29 ENCOUNTER — Encounter (HOSPITAL_COMMUNITY)
Admission: EM | Disposition: A | Discharge status: Skilled Nursing Facility | Payer: Self-pay | Source: Home / Self Care | Attending: Thoracic Surgery (Cardiothoracic Vascular Surgery)

## 2020-04-29 DIAGNOSIS — I447 Left bundle-branch block, unspecified: Secondary | ICD-10-CM

## 2020-04-29 DIAGNOSIS — I255 Ischemic cardiomyopathy: Secondary | ICD-10-CM

## 2020-04-29 DIAGNOSIS — I5042 Chronic combined systolic (congestive) and diastolic (congestive) heart failure: Secondary | ICD-10-CM

## 2020-04-29 HISTORY — PX: BIV PACEMAKER INSERTION CRT-P: EP1199

## 2020-04-29 LAB — BASIC METABOLIC PANEL
Anion gap: 10 (ref 5–15)
BUN: 15 mg/dL (ref 8–23)
CO2: 27 mmol/L (ref 22–32)
Calcium: 8.6 mg/dL — ABNORMAL LOW (ref 8.9–10.3)
Chloride: 100 mmol/L (ref 98–111)
Creatinine, Ser: 0.92 mg/dL (ref 0.44–1.00)
GFR, Estimated: >60 mL/min (ref >60)
Glucose, Bld: 101 mg/dL — ABNORMAL HIGH (ref 70–99)
Potassium: 3.8 mmol/L (ref 3.5–5.1)
Sodium: 137 mmol/L (ref 135–145)

## 2020-04-29 LAB — CBC
HCT: 31.5 % — ABNORMAL LOW (ref 36.0–46.0)
Hemoglobin: 10.5 g/dL — ABNORMAL LOW (ref 12.0–15.0)
MCH: 28.8 pg (ref 26.0–34.0)
MCHC: 33.3 g/dL (ref 30.0–36.0)
MCV: 86.5 fL (ref 80.0–100.0)
Platelets: 178 10*3/uL (ref 150–400)
RBC: 3.64 MIL/uL — ABNORMAL LOW (ref 3.87–5.11)
RDW: 14.5 % (ref 11.5–15.5)
WBC: 12.2 10*3/uL — ABNORMAL HIGH (ref 4.0–10.5)
nRBC: 0 % (ref 0.0–0.2)

## 2020-04-29 LAB — ECHOCARDIOGRAM COMPLETE
Area-P 1/2: 6.48 cm2
Calc EF: 33.8 %
Height: 67 in
P 1/2 time: 630 msec
S' Lateral: 3.1 cm
Single Plane A2C EF: 29.4 %
Single Plane A4C EF: 32.5 %
Weight: 2119.94 oz

## 2020-04-29 LAB — GLUCOSE, CAPILLARY
Glucose-Capillary: 90 mg/dL (ref 70–99)
Glucose-Capillary: 60 mg/dL — ABNORMAL LOW (ref 70–99)
Glucose-Capillary: 129 mg/dL — ABNORMAL HIGH (ref 70–99)
Glucose-Capillary: 102 mg/dL — ABNORMAL HIGH (ref 70–99)

## 2020-04-29 LAB — PROTIME-INR
INR: 1.2 (ref 0.8–1.2)
Prothrombin Time: 15 seconds (ref 11.4–15.2)

## 2020-04-29 SURGERY — BIV PACEMAKER INSERTION CRT-P

## 2020-04-29 MED ORDER — CEFAZOLIN SODIUM-DEXTROSE 2-4 GM/100ML-% IV SOLN
2.0000 g | INTRAVENOUS | Status: AC
  Administered 2020-04-29: 2 g via INTRAVENOUS
  Filled 2020-04-29: qty 100

## 2020-04-29 MED ORDER — IOHEXOL 350 MG/ML SOLN
INTRAVENOUS | Status: DC | PRN
  Administered 2020-04-29: 20 mL via INTRAVENOUS

## 2020-04-29 MED ORDER — SODIUM CHLORIDE 0.9 % IV SOLN: INTRAVENOUS | Status: DC

## 2020-04-29 MED ORDER — CHLORHEXIDINE GLUCONATE 4 % EX LIQD
60.0000 mL | Freq: Once | CUTANEOUS | Status: AC
  Administered 2020-04-29: 4 via TOPICAL
  Filled 2020-04-29: qty 15

## 2020-04-29 MED ORDER — SODIUM CHLORIDE 0.9 % IV SOLN
80.0000 mg | INTRAVENOUS | Status: AC
  Administered 2020-04-29: 80 mg
  Filled 2020-04-29: qty 2

## 2020-04-29 MED ORDER — PERFLUTREN LIPID MICROSPHERE
1.0000 mL | INTRAVENOUS | Status: AC | PRN
Start: 2020-04-29 — End: 2020-04-29
  Administered 2020-04-29: 2 mL via INTRAVENOUS
  Filled 2020-04-29: qty 10

## 2020-04-29 MED ORDER — CEFAZOLIN SODIUM-DEXTROSE 1-4 GM/50ML-% IV SOLN
1.0000 g | Freq: Four times a day (QID) | INTRAVENOUS | Status: AC
  Administered 2020-04-29 – 2020-04-30 (×3): 1 g via INTRAVENOUS
  Filled 2020-04-29 (×3): qty 50

## 2020-04-29 MED ORDER — HEPARIN (PORCINE) IN NACL 1000-0.9 UT/500ML-% IV SOLN
INTRAVENOUS | Status: DC | PRN
  Administered 2020-04-29: 500 mL

## 2020-04-29 MED ORDER — LIDOCAINE HCL (PF) 1 % IJ SOLN
INTRAMUSCULAR | Status: DC | PRN
  Administered 2020-04-29: 60 mL

## 2020-04-29 MED ORDER — WARFARIN SODIUM 2 MG PO TABS
2.0000 mg | ORAL_TABLET | Freq: Once | ORAL | Status: AC
  Administered 2020-04-29: 2 mg via ORAL
  Filled 2020-04-29: qty 1

## 2020-04-29 MED ORDER — SODIUM CHLORIDE 0.9 % IV SOLN
INTRAVENOUS | Status: AC
  Filled 2020-04-29: qty 2

## 2020-04-29 MED ORDER — DEXTROSE 50 % IV SOLN
1.0000 | Freq: Once | INTRAVENOUS | Status: AC
  Administered 2020-04-29: 50 mL via INTRAVENOUS

## 2020-04-29 MED ORDER — LIDOCAINE HCL 1 % IJ SOLN
INTRAMUSCULAR | Status: AC
  Filled 2020-04-29: qty 60

## 2020-04-29 MED ORDER — SPIRONOLACTONE 12.5 MG HALF TABLET
12.5000 mg | ORAL_TABLET | Freq: Every day | ORAL | Status: DC
  Administered 2020-04-29: 12.5 mg via ORAL
  Filled 2020-04-29: qty 1

## 2020-04-29 MED ORDER — DEXTROSE 50 % IV SOLN
INTRAVENOUS | Status: AC
  Filled 2020-04-29: qty 50

## 2020-04-29 MED ORDER — ACETAMINOPHEN 325 MG PO TABS
325.0000 mg | ORAL_TABLET | ORAL | Status: DC | PRN
Start: 2020-04-29 — End: 2020-05-06
  Administered 2020-05-01: 325 mg via ORAL
  Filled 2020-04-29 (×2): qty 2

## 2020-04-29 MED ORDER — HEPARIN (PORCINE) IN NACL 1000-0.9 UT/500ML-% IV SOLN
INTRAVENOUS | Status: AC
  Filled 2020-04-29: qty 500

## 2020-04-29 MED ORDER — CEFAZOLIN SODIUM-DEXTROSE 2-4 GM/100ML-% IV SOLN
INTRAVENOUS | Status: AC
  Filled 2020-04-29: qty 100

## 2020-04-29 MED ORDER — WARFARIN - PHARMACIST DOSING INPATIENT: Freq: Every day | Status: DC

## 2020-04-29 MED FILL — Heparin Sodium (Porcine) Inj 1000 Unit/ML: INTRAMUSCULAR | Qty: 30 | Status: AC

## 2020-04-29 MED FILL — Cefuroxime Sodium For Inj 750 MG: INTRAMUSCULAR | Qty: 750 | Status: AC

## 2020-04-29 MED FILL — Sodium Bicarbonate IV Soln 8.4%: INTRAVENOUS | Qty: 50 | Status: AC

## 2020-04-29 MED FILL — Magnesium Sulfate Inj 50%: INTRAMUSCULAR | Qty: 10 | Status: AC

## 2020-04-29 MED FILL — Albumin, Human Inj 5%: INTRAVENOUS | Qty: 250 | Status: AC

## 2020-04-29 MED FILL — Lidocaine HCl Local Soln Prefilled Syringe 100 MG/5ML (2%): INTRAMUSCULAR | Qty: 5 | Status: AC

## 2020-04-29 MED FILL — Heparin Sodium (Porcine) Inj 1000 Unit/ML: INTRAMUSCULAR | Qty: 10 | Status: AC

## 2020-04-29 MED FILL — Sodium Chloride IV Soln 0.9%: INTRAVENOUS | Qty: 2000 | Status: AC

## 2020-04-29 MED FILL — Potassium Chloride Inj 2 mEq/ML: INTRAVENOUS | Qty: 40 | Status: AC

## 2020-04-29 MED FILL — Mannitol IV Soln 20%: INTRAVENOUS | Qty: 500 | Status: AC

## 2020-04-29 MED FILL — Calcium Chloride Inj 10%: INTRAVENOUS | Qty: 10 | Status: AC

## 2020-04-29 MED FILL — Electrolyte-R (PH 7.4) Solution: INTRAVENOUS | Qty: 4000 | Status: AC

## 2020-04-29 SURGICAL SUPPLY — 18 items
CABLE SURGICAL S-101-97-12 (CABLE) ×2 IMPLANT
CATH CPS DIRECT 115 DS2C019 (CATHETERS) ×2 IMPLANT
CATH CPS DIRECT 135 DS2C020 (CATHETERS) ×2 IMPLANT
CATH CPS QUART CN DS2N029-65 (CATHETERS) ×2 IMPLANT
CATH JOSEPHSON 2-5-2 125 (CATHETERS) ×2 IMPLANT
CPS IMPLANT KIT 410190 (MISCELLANEOUS) ×2 IMPLANT
LEAD QUARTET 1456Q-86 (Lead) IMPLANT
LEAD TENDRIL MRI 52CM LPA1200M (Lead) ×1 IMPLANT
LEAD TENDRIL MRI 58CM LPA1200M (Lead) ×1 IMPLANT
MAT PREVALON FULL STRYKER (MISCELLANEOUS) ×1 IMPLANT
PACEMAKER QUDR ALLR CRT PM3562 (Pacemaker) IMPLANT
PAD PRO RADIOLUCENT 2001M-C (PAD) ×2 IMPLANT
PMKR QUADRA ALLURE CRT PM3562 (Pacemaker) ×2 IMPLANT
QUARTET 1456Q-86 (Lead) ×2 IMPLANT
SHEATH 8FR PRELUDE SNAP 13 (SHEATH) ×2 IMPLANT
TRAY PACEMAKER INSERTION (PACKS) ×2 IMPLANT
WIRE ACUITY WHISPER EDS 4648 (WIRE) ×2 IMPLANT
WIRE HI TORQ VERSACORE-J 145CM (WIRE) ×4 IMPLANT

## 2020-04-29 NOTE — H&P (View-Only) (Signed)
Electrophysiology Rounding Note  Patient Name: [name] Date of Encounter: 04/29/2020  Primary Cardiologist: Skeet Latch, MD Electrophysiologist: New   Subjective   The patient is doing well today.  She remains aphasic. Very excited to be conducting and possible avoid pacing.   Inpatient Medications    Scheduled Meds: . sodium chloride   Intravenous Once  . acetaminophen  1,000 mg Oral Q6H   Or  . acetaminophen (TYLENOL) oral liquid 160 mg/5 mL  1,000 mg Per Tube Q6H  . aspirin EC  325 mg Oral Daily   Or  . aspirin  324 mg Per Tube Daily  . atorvastatin  80 mg Oral QHS  . baclofen  20 mg Oral BID  . bisacodyl  10 mg Oral Daily   Or  . bisacodyl  10 mg Rectal Daily  . Chlorhexidine Gluconate Cloth  6 each Topical Daily  . docusate sodium  200 mg Oral Daily  . enoxaparin (LOVENOX) injection  40 mg Subcutaneous QHS  . insulin aspart  0-9 Units Subcutaneous TID WC  . lacosamide  100 mg Oral BID  . levETIRAcetam  1,500 mg Oral BID  . mouth rinse  15 mL Mouth Rinse BID  . pantoprazole  40 mg Oral Daily  . sodium chloride flush  3 mL Intravenous Q12H  . spironolactone  12.5 mg Oral Daily   Continuous Infusions: . sodium chloride    . sodium chloride    . sodium chloride    . lactated ringers    . lactated ringers Stopped (04/28/20 0801)  . norepinephrine (LEVOPHED) Adult infusion 4 mcg/min (04/27/20 0030)   PRN Meds: sodium chloride, midazolam, morphine injection, ondansetron (ZOFRAN) IV, oxyCODONE, polyethylene glycol, sodium chloride flush, traMADol   Vital Signs    Vitals:   04/29/20 0300 04/29/20 0400 04/29/20 0500 04/29/20 0600  BP: (!) 91/55 (!) 95/56 112/60 109/61  Pulse:   91 90  Resp: 20 (!) 21 (!) 21 (!) 22  Temp:      TempSrc:      SpO2:  98% 98% 98%  Weight:      Height:        Intake/Output Summary (Last 24 hours) at 04/29/2020 0747 Last data filed at 04/29/2020 0000 Gross per 24 hour  Intake 381.33 ml  Output 900 ml  Net  -518.67 ml   Filed Weights   04/26/20 0500 04/27/20 0500 04/28/20 0500  Weight: 61.4 kg 59.8 kg 60.1 kg    Physical Exam    GEN- The patient is well appearing, alert and oriented x 3 today.   Head- normocephalic, atraumatic Eyes-  Sclera clear, conjunctiva pink Ears- hearing intact Oropharynx- clear Neck- supple Lungs- Clear to ausculation bilaterally, normal work of breathing Heart- Slow, regular rate and rhythm, no murmurs, rubs or gallops GI- soft, NT, ND, + BS Extremities- no clubbing or cyanosis. No edema Skin- no rash or lesion Psych- euthymic mood, full affect Neuro- strength and sensation are intact  Labs    CBC Recent Labs    04/28/20 0436 04/29/20 0217  WBC 13.1* 12.2*  HGB 9.6* 10.5*  HCT 28.9* 31.5*  MCV 88.4 86.5  PLT 132* 341   Basic Metabolic Panel Recent Labs    04/26/20 1555 04/27/20 0359 04/28/20 0436 04/29/20 0217  NA 137   < > 133* 137  K 4.3   < > 4.2 3.8  CL 106   < > 100 100  CO2 25   < > 27 27  GLUCOSE 139*   < > 93 101*  BUN 13   < > 13 15  CREATININE 1.06*   < > 0.77 0.92  CALCIUM 8.2*   < > 8.2* 8.6*  MG 2.2  --   --   --    < > = values in this interval not displayed.   Liver Function Tests No results for input(s): AST, ALT, ALKPHOS, BILITOT, PROT, ALBUMIN in the last 72 hours. No results for input(s): LIPASE, AMYLASE in the last 72 hours. Cardiac Enzymes No results for input(s): CKTOTAL, CKMB, CKMBINDEX, TROPONINI in the last 72 hours.   Telemetry    V pacing 90s, turned down this am to 40 with Sinus brady in 50s.  (personally reviewed)  Radiology    DG Chest Port 1 View  Result Date: 04/28/2020 CLINICAL DATA:  Sore chest.  Status post open heart surgery. EXAM: PORTABLE CHEST 1 VIEW COMPARISON:  04/27/2020. FINDINGS: Right IJ sheath in stable position. Prior CABG. Stable cardiomegaly. Persistent bibasilar pulmonary infiltrates/edema and bilateral pleural effusions. No pneumothorax. IMPRESSION: 1.  Right IJ sheath in  stable position. 2. Prior CABG. Stable cardiomegaly. Persistent bibasilar infiltrates/edema and bilateral pleural effusions again noted. Electronically Signed   By: Marcello Moores  Register   On: 04/28/2020 06:24    Patient Profile     [name] a 70 y.o.femalewith a hx of HTN, HLD, stroke (expessive aphasia andR sided deficit), sickle cell anemia, seizure d/o, chronic HF (systolic),who is being seen today for the evaluation of CHBat the request of Dr. Aundra Dubin.  - Admitted for CHB.  - Echo with EF 35-40%, inferior and lateral HK, normal RV.  - Coronary angiography: 60-70% dLM, 60-70% ostial LCx, 99% proximal calcified LCx (large/codominant LCx) - 4/4 S/p CABG x 4 (LIMA-LAD, SVG-OM2) - 4/5 Extubated   Assessment & Plan    1. CHB Pt conducting in 50s this am. She hasn't yet been able to ambulate, so unclear if her rates pick up.  Dr. Curt Bears has seen the patient. We turned temp pacer down to 40 and [name] follow over the next few hours.  If continues to conduct, would be reasonable to continue to observe conduction through weekend.   2. CAD s/p CABG x 4 ((LIMA-LAD, SVG-OM2)) TCTS and HF clinic following and managing.  3. Elevated white count Now trending down to 12.2. Likely reactive post op.  T max 99.5 (4/7 @ 1114)  For questions or updates, please contact [place] Please consult [email or web address] for contact info under Cardiology/STEMI.  Signed, Shirley Friar, PA-C  04/29/2020, 7:47 AM   I have seen and examined this patient with [name].  Agree with above, note added to reflect my findings.  On exam, RRR, no murmurs.  Patient temporary wire was turned down and she had conduction with sinus rates in the 50s but with sinus rates in the 90s, no conduction was seen. [name] plan for CRT-P implant.  [name] has presented today for surgery, with the diagnosis of complete AV block.  The various methods of treatment have been discussed with the patient  and family. After consideration of risks, benefits and other options for treatment, the patient has consented to  Procedure(s): Pacemaker implant as a surgical intervention .  Risks include but not limited to bleeding, infection, pneumothorax, perforation, tamponade, vascular damage, renal failure, MI, stroke, death, and lead dislodgement . The patient's history has been reviewed, patient examined, no change in status, stable for surgery.  I have reviewed the  patient's chart and labs.  Questions were answered to the patient's satisfaction.    Catha Ontko Curt Bears, MD 04/29/2020 11:43 AM    Maxtyn Nuzum M. Shekira Drummer MD 04/29/2020 7:55 AM

## 2020-04-29 NOTE — Interval H&P Note (Signed)
History and Physical Interval Note:  04/29/2020 11:44 AM  [name]  has presented today for surgery, with the diagnosis of heart block.  The various methods of treatment have been discussed with the patient and family. After consideration of risks, benefits and other options for treatment, the patient has consented to  Procedure(s): BIV PACEMAKER INSERTION CRT-P (N/A) as a surgical intervention.  The patient's history has been reviewed, patient examined, no change in status, stable for surgery.  I have reviewed the patient's chart and labs.  Questions were answered to the patient's satisfaction.     Wilbur Labuda [employer]

## 2020-04-29 NOTE — Progress Notes (Signed)
[place] for Warfarin  Indication: stroke  Allergies  Allergen Reactions  . Lexiscan [Regadenoson] Other (See Comments)    Perioral tingling and throat tightness    Patient Measurements: Height: 5\' 7"  (170.2 cm) Weight: 60.1 kg (132 lb 7.9 oz) IBW/kg (Calculated) : 61.6 Heparin Dosing Weight: 57 kg  Vital Signs: Temp: 97.6 F (36.4 C) (04/08 1130) Temp Source: Axillary (04/08 1130) BP: 109/61 (04/08 0600) Pulse Rate: 90 (04/08 0600)  Labs: Recent Labs    04/27/20 0359 04/28/20 0436 04/29/20 0217  HGB 9.6* 9.6* 10.5*  HCT 28.4* 28.9* 31.5*  PLT 120* 132* 178  LABPROT  --  15.2 15.0  INR  --  1.3* 1.2  CREATININE 0.86 0.77 0.92    Estimated Creatinine Clearance: 53.2 mL/min (by C-G formula based on SCr of 0.92 mg/dL).   Medical History: Past Medical History:  Diagnosis Date  . Adenomatous polyp of colon 09/2012   repeat colonoscopy in 5 years by Dr. Sharlett Iles  . CHF (congestive heart failure) (HCC)    EF 15-20% as of 05/28/11 (Dr Legrand [facility] D[facility] summary)  . CVA (cerebral infarction) 12/2007  . Hemiparesis ([place])   . Hyperlipidemia   . Hypertension   . Seizures ([place])   . Sickle cell anemia (HCC)   . Stroke ([place])   . Vitamin D deficiency 2010    Assessment: 70 yo female presents with CHB now s/p temporary pacer. On warfarin PTA for hx embolic stroke, no known hx of afib. INR on admit therapeutic at 2.2. LHC on 4/1 shows LM-LCx disease, PCI held for CABG consult. Pharmacy asked start heparin 8 hr after sheath pull and when INR <2.   PTA warfarin regimen: 5 mg daily except 7.5 mg Mon/Thurs INR on admit: 2.2  Warfarin and heparin held post CABG.  In CHB plan PPM today then will resume warfarin this evening  INR 1.2 today. CBC stable post op    Goal of Therapy: Heparin level 0.3-0.7  INR 2-3 Monitor platelets by anticoagulation protocol: Yes   Plan:  Warfarin 2mg  x1 this evening  INR daily    Bonnita Nasuti Pharm.D. CPP, BCPS Clinical Pharmacist [phone] 04/29/2020 2:22 PM

## 2020-04-29 NOTE — Discharge Summary (Addendum)
[address]       [place] [address]             [phone]      Physician Discharge Summary  Patient ID: [name] [name] MRN: [id number] DOB/AGE: 72-Sep-1952 72 y.o.  Admit date: 04/21/2020 Discharge date: 05/05/2020  Admission Diagnoses:   Patient Active Problem List   Diagnosis Date Noted   Third degree heart block ([place])    Complete heart block ([place]) 04/21/2020   Bleeding tendency ([place]) 12/14/2019   Encounter for monitoring digoxin therapy 04/09/2019   Status epilepticus ([place])    Chronic combined systolic and diastolic heart failure ([place])    Demand ischemia ([place])    Acquired right foot drop 01/12/2017   Osteoporosis 12/03/2014   Vitamin D deficiency 12/19/2012   Dysarthria as late effect of cerebrovascular accident (CVA) 12/12/2012   Gait instability 12/12/2012   Contracture of joint 12/12/2012   Hemiplegia of dominant side as late effect following cerebrovascular disease ([place]) 12/12/2012   Anticoagulated on Coumadin 09/19/2011   Secondary cardiomyopathy ([place]) 08/24/2011   (L) MCA cardio embolic stroke  58/09/9831   H/O tobacco use 06/25/2011   Weakness 05/28/2011   History of CVA (cerebrovascular accident) 05/28/2011   HTN (hypertension) 05/28/2011   HLD (hyperlipidemia) 05/28/2011   Seizure disorder ([place]) 05/28/2011    Discharge Diagnoses:  Left main/ 2 vessel coronary artery disease S/p CABG x 2 Active Problems:   Complete heart block (HCC)   Third degree heart block ([place])   Discharged Condition: good   History of Present Illness:  [name] is a 70 year old female with past history significant for left middle cerebral artery cerebrovascular accident in 2009 with suspected cardioembolic origin for which she has residual hemiplegia and aphasia.  She also has a seizure disorder that is managed with Keppra.  Other past medical history include hypertension, dyslipidemia, and sickle cell anemia.  She has known cardiomyopathy with  chronic combined diastolic and systolic heart failure.  She had an abnormal Myoview stress test in 2020 that showed large severe fixed inferior, anterior apical, and inferolateral perfusion defects.  Ejection fraction was estimated at 34%.   [name] was in her usual state of health until yesterday when her husband witnessed what he thought was a seizure.  Mrs. [name] had just gotten dressed to go out with her husband and was putting a scarf around her neck when he noticed her making gurgling sounds.  He noticed that she had slumped into a chair but had not lost consciousness.  EMS was summonedand a cardiac monitor was placed. She was found to be in complete heart block.  She was transported to [facility] ED while being percutaneously paced. She can not remember if she had any chest pain during this episode.   Once in the emergency room, a transvenous lead was placed emergently and pacing continued.  She was admitted to the hospital for further work-up and monitoring.  Her digoxin and carvedilol that she had been taking regularly were held.  Since admission, she is regained one-to-one conduction.  The heart failure team was consulted.  Left heart catheterization was recommended and was carried out earlier today.  This demonstrates a 70% distal left main coronary artery stenosis along with a 70% stenosis in the proximal circumflex.  Repeat echo has been performed this admission and redemonstrates an ejection fraction of around 35%.  There is moderate aortic valve sclerosis without significant stenosis.  The mitral valve  had trivial insufficiency, no significant stenosis.   [name] is aphasic and has right hemiplegia.  According to her husband and chart review, she continues to function well at home taking care of her own activities of daily living. She washes and dresses herself, cooks, irons clothing and ambulates with a cane.   Cardiothoracic surgery consultation was requested and the patient was  evaluated by Dr. Roxan Hockey.  It was felt that PCI would be technically difficult.  She was felt to be high risk for post operative complications and she would likely have a prolonged recovery due to her previous stroke deficits.  They ultimately wished to proceed with CABG procedure.  The risks and benefits of the procedure were explained to the patient and she was agreeable to proceed.   Hospital Course:  Mrs. [name] was taken to the operating room on 04/25/2020.  She underwent CABG x 2 utilizing LIMA to LAD and SVG to OM.  She also underwent endoscopic harvest of her greater saphenous vein from her right thigh.  She tolerated the procedure without difficulty and was taken to the SICU in stable condition.  POD 1 she remained intubated with CHB. Her renal function remained stable. We discontinued her chest tubes and consulted PT/OT for baseline weakness from previous stroke. Speech therapy was consulted for diet recommendations given her aspiration risk.  Dr. Aundra Dubin was consulted for assistance with her chronic systolic CHF. Joesph July and Dr. Curt Bears continued to follow her for her AV conduction issues and give their advice. POD 3 she continued to slowly progress. We were able to wean her milrinone and discontinue her central line. Her CXR showed bibasilar atelectasis which we continued to encourage her incentive spirometer use. We started the process for SNF placement and insurance approval. POD 4 we continued IV lasix for fluid overload.  She does have an expected acute blood loss anemia which is stable.  Most recent hemoglobin hematocrit dated 4/14 are 9.4/28.5 respectively.  Blood sugars have been under adequate control.  She is not a diabetic.  Renal function is within normal limits.  Most recent INR dated 05/05/2020 is 2.0 and she will be discharged on 4 mg daily of Coumadin.  INR will need to be monitored in the skilled facility.  She had previously been on 5 mg at home and is on this chronically  following her CVA.  INR goal is 2.0-3.0.  We continued lovenox for DVT prophylaxis. EP decided to go forward with PPM placement on 04/29/2020. PT and OT continued to work with the patient and heart failure continued to titrate her medications.  She is on colchicine for a planned 7 days as she developed some gout-like symptoms.  It can be discontinued after that unless she remains symptomatic.  If she becomes nauseated or has diarrhea can also be stopped.  We continued to work on a nursing home for placement after her hospital stay. Today, she is tolerating room air, her incisions are healing well, she is back to her baseline with her activity level, and she is ready for discharge to SNF.    Consults:  heart failure, EP  Significant Diagnostic Studies: CLINICAL DATA:  Shortness of breath.   EXAM: PORTABLE CHEST 1 VIEW   COMPARISON:  April 28, 2020.   FINDINGS: Stable cardiomegaly. Status post coronary bypass graft. No pneumothorax is noted. Mild central pulmonary vascular congestion is noted. Bibasilar opacities are noted concerning for edema or atelectasis with associated pleural effusions, left greater than right. Bony thorax  is unremarkable.   IMPRESSION: Stable cardiomegaly with mild central pulmonary vascular congestion. Bibasilar opacities are noted concerning for edema or atelectasis with associated pleural effusions, left greater than right.   Aortic Atherosclerosis (ICD10-I70.0).     Electronically Signed   By: Marijo Conception M.D.   On: 04/29/2020 08:31     Treatments:   NAME: [name], Margreat M. MEDICAL RECORD NO: [id number] ACCOUNT NO: [id number] DATE OF BIRTH: March 11, 1950 FACILITY: [facility] LOCATION: [facility] PHYSICIAN: Revonda Standard. Roxan Hockey, MD   Operative Report    DATE OF PROCEDURE: 04/25/2020   PREOPERATIVE DIAGNOSIS:  Left main and 2-vessel coronary artery disease.   POSTOPERATIVE DIAGNOSIS:  Left main and 2-vessel coronary artery disease.   PROCEDURES:  Median  sternotomy, extracorporeal circulation, coronary artery bypass grafting x2 (left internal mammary artery to LAD, saphenous vein graft to obtuse marginal 2), and Endoscopic vein harvest, right thigh.   SURGEON:  Revonda Standard. Roxan Hockey, MD   ASSISTANT:  Ellwood Handler, PA   ANESTHESIA:  General.  Discharge Exam: Blood pressure 103/68, pulse 95, temperature 98.1 F (36.7 C), temperature source Oral, resp. rate 20, height 5\' 7"  (1.702 m), weight 56.7 kg, SpO2 96 %.   General appearance: alert, cooperative and no distress Heart: sinus bradycadia Lungs: clear to auscultation bilaterally Abdomen: soft, non-tender; bowel sounds normal; no masses,  no organomegaly Extremities: extremities normal, atraumatic, no cyanosis or edema Wound: clean and dry  Disposition: Discharge disposition: 03-Skilled Nursing Facility       Discharge Instructions     Discharge patient   Complete by: As directed    Discharge disposition: 03-Skilled [place]   Discharge patient date: 05/05/2020      Allergies as of 05/05/2020       Reactions   Lexiscan [regadenoson] Other (See Comments)   Perioral tingling and throat tightness        Medication List     STOP taking these medications    carvedilol 6.25 MG tablet Commonly known as: COREG   digoxin 0.125 MG tablet Commonly known as: LANOXIN   furosemide 40 MG tablet Commonly known as: LASIX       TAKE these medications    acetaminophen 325 MG tablet Commonly known as: TYLENOL Take 1-2 tablets (325-650 mg total) by mouth every 4 (four) hours as needed for mild pain.   aspirin 81 MG EC tablet Take 1 tablet (81 mg total) by mouth daily. Swallow whole.   atorvastatin 80 MG tablet Commonly known as: LIPITOR TAKE 1 TABLET AT BEDTIME   baclofen 20 MG tablet Commonly known as: LIORESAL TAKE 1 TABLET THREE TIMES DAILY (MAY TAKE AN ADDITIONAL TABLET MID-DAY OR BEFORE BED AS NEEDED FOR MUSCLE SPASMS)   Cholecalciferol 50 MCG (2000  UT) Tabs Take by mouth.   colchicine 0.6 MG tablet Take 1 tablet (0.6 mg total) by mouth daily.   Entresto 24-26 MG Generic drug: sacubitril-valsartan Take 1 tablet by mouth 2 (two) times daily.   Lacosamide 100 MG Tabs Take 1 tablet (100 mg total) by mouth 2 (two) times daily.   levETIRAcetam 750 MG tablet Commonly known as: KEPPRA Take 2 tablets (1,500 mg total) by mouth 2 (two) times daily.   metoprolol succinate 25 MG 24 hr tablet Commonly known as: TOPROL-XL Take 0.5 tablets (12.5 mg total) by mouth at bedtime.   polyethylene glycol 17 g packet Commonly known as: MIRALAX / GLYCOLAX Take 17 g by mouth daily as needed for moderate constipation.   spironolactone 25 MG tablet Commonly  known as: ALDACTONE TAKE 1/2 TABLET EVERY DAY   traMADol 50 MG tablet Commonly known as: ULTRAM Take 1 tablet (50 mg total) by mouth every 6 (six) hours as needed for moderate pain.   warfarin 4 MG tablet Commonly known as: COUMADIN Take 1 tablet (4 mg total) by mouth one time only at 4 PM. What changed:  medication strength See the new instructions.        Follow-up Information     Melrose Nakayama, MD Follow up on 05/31/2020.   Specialty: Cardiothoracic Surgery Why: Appointment is at 3:45, please get CXR at 3:15 at [facility] imaging located on first floor of our office building Contact information: [place] [address] [phone]         Just, [name], FNP. Call in 1 day(s).   Specialty: Family Medicine Contact information: [address] [place] [address] [phone]         Skeet Latch, MD .   Specialty: Cardiology Contact information: [address] [place] [address] [phone]         [place] Follow up.   Why: on 4/19 at 0920 am for post hospital follow up Contact information: [place]  [id number] [phone]        Larey Dresser, MD Follow up.   Specialty: Cardiology Contact information: [address] [place] [address] [phone]                 The patient has been discharged on:   1.Beta Blocker:  Yes [   ]                              No   [ no  ]                              If No, reason:  2.Ace Inhibitor/ARB: Yes [ yes  ]                                     No  [    ]                                     If No, reason:  3.Statin:   Yes [place]   ]                  No  [   ]                  If No, reason:  4.[place]:  Yes  [  yes ]                  No   [   ]                  If No, reason:    Signed: Wilder Glade Gold PA-C 05/05/2020, 3:32 PM

## 2020-04-29 NOTE — Progress Notes (Signed)
  Echocardiogram 2D Echocardiogram with definity has been performed.  [name] M 04/29/2020, 8:44 AM

## 2020-04-29 NOTE — Progress Notes (Addendum)
Patient ID: [name], female   DOB: 20-Nov-1950, 70 y.o.   MRN: [id number]     Advanced Heart Failure Rounding Note  PCP-Cardiologist: Skeet Latch, MD  AHF: Dr. Aundra Dubin   Subjective:   - Admitted for CHB.  - Echo with EF 35-40%, inferior and lateral HK, normal RV.  - Coronary angiography: 60-70% dLM, 60-70% ostial LCx, 99% proximal calcified LCx (large/codominant LCx) - 4/4  S/p CABG x 4 (LIMA-LAD, SVG-OM2) - 4/5 Extubated   Off all drips.  Yesterday milrinone stopped and diuresed with IV lasix.   WBC stable 13.8>>13.1>>12.2    Remains pacer dependent,  in CHB, A-V paced.   Denies pain.   Objective:   Weight Range: 60.1 kg Body mass index is 20.75 kg/m.   Vital Signs:   Temp:  [98.6 F (37 C)-99.9 F (37.7 C)] 98.6 F (37 C) (04/07 2344) Pulse Rate:  [87-93] 90 (04/08 0600) Resp:  [19-34] 22 (04/08 0600) BP: (84-137)/(34-99) 109/61 (04/08 0600) SpO2:  [91 %-100 %] 98 % (04/08 0600) Last BM Date: 04/19/20  Weight change: Filed Weights   04/26/20 0500 04/27/20 0500 04/28/20 0500  Weight: 61.4 kg 59.8 kg 60.1 kg    Intake/Output:   Intake/Output Summary (Last 24 hours) at 04/29/2020 0648 Last data filed at 04/29/2020 0000 Gross per 24 hour  Intake 381.33 ml  Output 900 ml  Net -518.67 ml      Physical Exam    General: . No resp difficulty HEENT: normal Neck: supple. no JVD. Carotids 2+ bilat; no bruits. No lymphadenopathy or thryomegaly appreciated. Cor: PMI nondisplaced. Regular rate & rhythm. No rubs, gallops or murmurs. Sternal dressing.  Lungs:  Decreased on left. On Room Air.  Abdomen: soft, nontender, nondistended. No hepatosplenomegaly. No bruits or masses. Good bowel sounds. Extremities: no cyanosis, clubbing, rash, edema Neuro: alert , cranial nerves grossly intact. No movement on RUE/RLE from previous stroke. Aphasic. Marland Kitchen   Telemetry   A-V Paced 80s, CHB  (personally reviewed)  Labs    CBC Recent Labs    04/28/20 0436  04/29/20 0217  WBC 13.1* 12.2*  HGB 9.6* 10.5*  HCT 28.9* 31.5*  MCV 88.4 86.5  PLT 132* 539   Basic Metabolic Panel Recent Labs    04/26/20 1555 04/27/20 0359 04/28/20 0436 04/29/20 0217  NA 137   < > 133* 137  K 4.3   < > 4.2 3.8  CL 106   < > 100 100  CO2 25   < > 27 27  GLUCOSE 139*   < > 93 101*  BUN 13   < > 13 15  CREATININE 1.06*   < > 0.77 0.92  CALCIUM 8.2*   < > 8.2* 8.6*  MG 2.2  --   --   --    < > = values in this interval not displayed.   Liver Function Tests No results for input(s): AST, ALT, ALKPHOS, BILITOT, PROT, ALBUMIN in the last 72 hours. No results for input(s): LIPASE, AMYLASE in the last 72 hours. Cardiac Enzymes No results for input(s): CKTOTAL, CKMB, CKMBINDEX, TROPONINI in the last 72 hours.  BNP: BNP (last 3 results) No results for input(s): BNP in the last 8760 hours.  ProBNP (last 3 results) No results for input(s): PROBNP in the last 8760 hours.   D-Dimer No results for input(s): DDIMER in the last 72 hours. Hemoglobin A1C No results for input(s): HGBA1C in the last 72 hours. Fasting Lipid Panel No results for input(s):  CHOL, HDL, LDLCALC, TRIG, CHOLHDL, LDLDIRECT in the last 72 hours. Thyroid Function Tests No results for input(s): TSH, T4TOTAL, T3FREE, THYROIDAB in the last 72 hours.  Invalid input(s): FREET3  Other results:   Imaging    No results found.   Medications:     Scheduled Medications: . sodium chloride   Intravenous Once  . acetaminophen  1,000 mg Oral Q6H   Or  . acetaminophen (TYLENOL) oral liquid 160 mg/5 mL  1,000 mg Per Tube Q6H  . aspirin EC  325 mg Oral Daily   Or  . aspirin  324 mg Per Tube Daily  . atorvastatin  80 mg Oral QHS  . baclofen  20 mg Oral BID  . bisacodyl  10 mg Oral Daily   Or  . bisacodyl  10 mg Rectal Daily  . Chlorhexidine Gluconate Cloth  6 each Topical Daily  . docusate sodium  200 mg Oral Daily  . enoxaparin (LOVENOX) injection  40 mg Subcutaneous QHS  . furosemide   40 mg Intravenous Daily  . insulin aspart  0-9 Units Subcutaneous TID WC  . lacosamide  100 mg Oral BID  . levETIRAcetam  1,500 mg Oral BID  . mouth rinse  15 mL Mouth Rinse BID  . pantoprazole  40 mg Oral Daily  . sodium chloride flush  3 mL Intravenous Q12H    Infusions: . sodium chloride    . sodium chloride    . sodium chloride    . lactated ringers    . lactated ringers Stopped (04/28/20 0801)  . norepinephrine (LEVOPHED) Adult infusion 4 mcg/min (04/27/20 0030)    PRN Medications: sodium chloride, midazolam, morphine injection, ondansetron (ZOFRAN) IV, oxyCODONE, polyethylene glycol, sodium chloride flush, traMADol   Assessment/Plan   1. Complete heart block: Patient had baseline NSR with LAFB/RBBB, so there was underlying conduction system disease.  Suspect this progressed to CHB with beta blocker/digoxin use + chronic ischemia in territory of large  co-dominant LCx.  - Off nodal blockers.  - remains in CHB despite revascularization, s/p CABG - EP following, planning BiV pacer today.  2. Chronic systolic CHF: Most recent prior echo in 8/20 with EF 35-40% and wall motion abnormalities.  Ischemic cardiomyopathy with left main/severe codominant LCx disease.  Echo this admission with stable EF 35-40%, inferior and lateral hypokinesis.  Not significantly volume overloaded on exam. Per her husband, at baseline she is aphasic but can walk around the house and do her ADLs. Now s/p CABG 4/4. Off NE. On milrinone 0.125, Co-ox 60%. CVP 4-5 but remains above pre-op wt.  - Off all drips. Central access out for PPM today. .  - Volume status stable. Hold diuretics. Tomorrow can restart lasix 40 mg po daily.    - Hold digoxin and Coreg with CHB.  - Hold Entresto with soft BP.  3. 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  Has been on warfarin.  Has had long-standing aphasia and right hemiparesis.   - Warfarin on hold. Resume anticoagulation once ok w/ CT surgery   4. H/o seizure disorder:  Continue Keppra.  5. ?Sickle cell anemia: Carries this diagnosis but not sure truly present.  6. CAD:  LHC showed 60-70% distal left main stenosis, heavily calcified proximal LCx with 60-70% stenosis at the ostium and discrete 99% stenosis in the proximal LCx.  I suspect that her CHB is related to chronic ischemia from LM-LCx disease worsened by nodal blockade.  Difficult situation.  She has surgical disease but  will be more difficult to rehab after CABG with weakness from prior stroke though per her husband, she does all ADLs and walks with cane at home.  PCI would be possible but high risk/difficult heavy calcification and tortuousity. Had CABG x 2 4/4 w/ LIMA-LAD, SVG-OM2  - Continue ASA 81, statin.  7. ID: WBC stable at 12.2 . Currently AF.  -? Check PCT   - CXR improved today.   Length of Stay: [place], NP  04/29/2020, 6:48 AM  Advanced Heart Failure Team Pager [phone] (M-F; 7a - 5p)  Please contact [facility] for night-coverage after hours (5p -7a ) and weekends on [email or web address]  Patient seen with NP, agree with the above note.   This morning, off NE and milrinone.  SBP 100.    Pacer wires turned down to backup 40 ppm, she is in NSR with 1st degree AVB, rate 50s.    General: NAD Neck: No JVD, no thyromegaly or thyroid nodule.  Lungs: Clear to auscultation bilaterally with normal respiratory effort. CV: Nondisplaced PMI.  Heart regular S1/S2, no S3/S4, no murmur.  No peripheral edema.  N Abdomen: Soft, nontender, no hepatosplenomegaly, no distention.  Skin: Intact without lesions or rashes.  Neurologic: Aphasia, right-sided weakness  Psych: Normal affect. Extremities: No clubbing or cyanosis.  HEENT: Normal.   Still needs echo (ordered Wednesday).  Will order again stat today.  She is currently conducted.  Discussed with EP, will watch with backup pacing at 40 bpm.  If she remains in NSR in 50s, no PPM.  EP to check back later in morning, keep her NPO for now.  If EF is  low and she needs pacing, will get BiV pacemaker.   Stable off inotrope.  Will add spironolactone 12.5 daily.  Can hold Lasix today (volume ok) and start po tomorrow.   Needs PT work.  Will need SNF.   Loralie Champagne 04/29/2020 7:25 AM

## 2020-04-29 NOTE — Progress Notes (Addendum)
[address]       [place] [address]             [phone]      4 Days Post-Op Procedure(s) (LRB): CORONARY ARTERY BYPASS GRAFTING (CABG) TIMES TWO USING LEFT INTERNAL MAMMARY ARTERY AND RIGHT GREATER SAPHENOUS VEIN HARVESTED ENDOSCOPICALLY (N/A) TRANSESOPHAGEAL ECHOCARDIOGRAM (TEE) Subjective: She feels okay this morning, She is hot so asking me to remove her blankets.   Objective: Vital signs in last 24 hours: Temp:  [98.6 F (37 C)-99.9 F (37.7 C)] 98.6 F (37 C) (04/07 2344) Pulse Rate:  [87-93] 90 (04/08 0600) Cardiac Rhythm: A-V Sequential paced (04/08 0400) Resp:  [19-34] 22 (04/08 0600) BP: (84-137)/(47-99) 109/61 (04/08 0600) SpO2:  [91 %-100 %] 98 % (04/08 0600)  Hemodynamic parameters for last 24 hours: CVP:  [6 mmHg] 6 mmHg  Intake/Output from previous day: 04/07 0701 - 04/08 0700 In: 381.3 [P.O.:100; I.V.:281.3] Out: 900 [Urine:900] Intake/Output this shift: No intake/output data recorded.  General appearance: alert, cooperative and no distress Heart: sinus bradycadia Lungs: clear to auscultation bilaterally Abdomen: soft, non-tender; bowel sounds normal; no masses,  no organomegaly Extremities: extremities normal, atraumatic, no cyanosis or edema Wound: clean and dry  Lab Results: Recent Labs    04/28/20 0436 04/29/20 0217  WBC 13.1* 12.2*  HGB 9.6* 10.5*  HCT 28.9* 31.5*  PLT 132* 178   BMET:  Recent Labs    04/28/20 0436 04/29/20 0217  NA 133* 137  K 4.2 3.8  CL 100 100  CO2 27 27  GLUCOSE 93 101*  BUN 13 15  CREATININE 0.77 0.92  CALCIUM 8.2* 8.6*    PT/INR:  Recent Labs    04/29/20 0217  LABPROT 15.0  INR 1.2   ABG    Component Value Date/Time   PHART 7.378 04/26/2020 0934   HCO3 21.9 04/26/2020 0934   TCO2 23 04/26/2020 0934   ACIDBASEDEF 3.0 (H) 04/26/2020 0934   O2SAT 59.5 04/28/2020 0436   CBG (last 3)  Recent Labs    04/28/20 1558 04/28/20 2121 04/29/20 0624  GLUCAP 97 106* 90     Assessment/Plan: S/P Procedure(s) (LRB): CORONARY ARTERY BYPASS GRAFTING (CABG) TIMES TWO USING LEFT INTERNAL MAMMARY ARTERY AND RIGHT GREATER SAPHENOUS VEIN HARVESTED ENDOSCOPICALLY (N/A) TRANSESOPHAGEAL ECHOCARDIOGRAM (TEE)   -POD4 CABG x 2, pre-op EF 35%. Stable hemodynamics. Heart failure team is following and managing milrinone and diuretics.  1. CV- Complete heart block- present on admission and has not improved despite revascularization. EP team planning for PPM today.  2. Pulm-bilateral pleural effusions on cxr. Continue to encourage incentive spirometer. Will add flutter valve.  3. Volume excess- Wt ~5kg+, on  Lasix 40mg  IV BID.  4. Expected acute blood loss anemia,  H and h 10.5/31.5, platelets 178 5.Endo-No h/o DM, glucose well controlled. 6. Remote, preop h/o presumed cardio-embolic CVA with residual aphasia and right hemiplegia-PT and OT evaluations reviewed- recommendations for SNF at discharge.  7. DVT PPX- SCD's + SQ enoxaparin.    Plan: At the bedside the EP this morning. She is bradycardic in the 50s but does have some conduction. They plan to wait a few hours and re-evaluate her rhythm. Remains NPO for now. Otherwise, continue working with PT/OT. Has some right sided leg pain that may be limiting.    LOS: 8 days    Elgie Collard 04/29/2020   I have seen and examined the patient and agree with the assessment and plan as outlined.  Overall looks good.  Awaiting PPM placement later today.  Rexene Alberts, MD 04/29/2020 3:01 PM

## 2020-04-29 NOTE — Progress Notes (Addendum)
Electrophysiology Rounding Note  Patient Name: [name] Date of Encounter: 04/29/2020  Primary Cardiologist: Skeet Latch, MD Electrophysiologist: New   Subjective   The patient is doing well today.  She remains aphasic. Very excited to be conducting and possible avoid pacing.   Inpatient Medications    Scheduled Meds: . sodium chloride   Intravenous Once  . acetaminophen  1,000 mg Oral Q6H   Or  . acetaminophen (TYLENOL) oral liquid 160 mg/5 mL  1,000 mg Per Tube Q6H  . aspirin EC  325 mg Oral Daily   Or  . aspirin  324 mg Per Tube Daily  . atorvastatin  80 mg Oral QHS  . baclofen  20 mg Oral BID  . bisacodyl  10 mg Oral Daily   Or  . bisacodyl  10 mg Rectal Daily  . Chlorhexidine Gluconate Cloth  6 each Topical Daily  . docusate sodium  200 mg Oral Daily  . enoxaparin (LOVENOX) injection  40 mg Subcutaneous QHS  . insulin aspart  0-9 Units Subcutaneous TID WC  . lacosamide  100 mg Oral BID  . levETIRAcetam  1,500 mg Oral BID  . mouth rinse  15 mL Mouth Rinse BID  . pantoprazole  40 mg Oral Daily  . sodium chloride flush  3 mL Intravenous Q12H  . spironolactone  12.5 mg Oral Daily   Continuous Infusions: . sodium chloride    . sodium chloride    . sodium chloride    . lactated ringers    . lactated ringers Stopped (04/28/20 0801)  . norepinephrine (LEVOPHED) Adult infusion 4 mcg/min (04/27/20 0030)   PRN Meds: sodium chloride, midazolam, morphine injection, ondansetron (ZOFRAN) IV, oxyCODONE, polyethylene glycol, sodium chloride flush, traMADol   Vital Signs    Vitals:   04/29/20 0300 04/29/20 0400 04/29/20 0500 04/29/20 0600  BP: (!) 91/55 (!) 95/56 112/60 109/61  Pulse:   91 90  Resp: 20 (!) 21 (!) 21 (!) 22  Temp:      TempSrc:      SpO2:  98% 98% 98%  Weight:      Height:        Intake/Output Summary (Last 24 hours) at 04/29/2020 0747 Last data filed at 04/29/2020 0000 Gross per 24 hour  Intake 381.33 ml  Output 900 ml  Net  -518.67 ml   Filed Weights   04/26/20 0500 04/27/20 0500 04/28/20 0500  Weight: 61.4 kg 59.8 kg 60.1 kg    Physical Exam    GEN- The patient is well appearing, alert and oriented x 3 today.   Head- normocephalic, atraumatic Eyes-  Sclera clear, conjunctiva pink Ears- hearing intact Oropharynx- clear Neck- supple Lungs- Clear to ausculation bilaterally, normal work of breathing Heart- Slow, regular rate and rhythm, no murmurs, rubs or gallops GI- soft, NT, ND, + BS Extremities- no clubbing or cyanosis. No edema Skin- no rash or lesion Psych- euthymic mood, full affect Neuro- strength and sensation are intact  Labs    CBC Recent Labs    04/28/20 0436 04/29/20 0217  WBC 13.1* 12.2*  HGB 9.6* 10.5*  HCT 28.9* 31.5*  MCV 88.4 86.5  PLT 132* 973   Basic Metabolic Panel Recent Labs    04/26/20 1555 04/27/20 0359 04/28/20 0436 04/29/20 0217  NA 137   < > 133* 137  K 4.3   < > 4.2 3.8  CL 106   < > 100 100  CO2 25   < > 27 27  GLUCOSE 139*   < > 93 101*  BUN 13   < > 13 15  CREATININE 1.06*   < > 0.77 0.92  CALCIUM 8.2*   < > 8.2* 8.6*  MG 2.2  --   --   --    < > = values in this interval not displayed.   Liver Function Tests No results for input(s): AST, ALT, ALKPHOS, BILITOT, PROT, ALBUMIN in the last 72 hours. No results for input(s): LIPASE, AMYLASE in the last 72 hours. Cardiac Enzymes No results for input(s): CKTOTAL, CKMB, CKMBINDEX, TROPONINI in the last 72 hours.   Telemetry    V pacing 90s, turned down this am to 40 with Sinus brady in 50s.  (personally reviewed)  Radiology    DG Chest Port 1 View  Result Date: 04/28/2020 CLINICAL DATA:  Sore chest.  Status post open heart surgery. EXAM: PORTABLE CHEST 1 VIEW COMPARISON:  04/27/2020. FINDINGS: Right IJ sheath in stable position. Prior CABG. Stable cardiomegaly. Persistent bibasilar pulmonary infiltrates/edema and bilateral pleural effusions. No pneumothorax. IMPRESSION: 1.  Right IJ sheath in  stable position. 2. Prior CABG. Stable cardiomegaly. Persistent bibasilar infiltrates/edema and bilateral pleural effusions again noted. Electronically Signed   By: Marcello Moores  Register   On: 04/28/2020 06:24    Patient Profile     [name] a 70 y.o.femalewith a hx of HTN, HLD, stroke (expessive aphasia andR sided deficit), sickle cell anemia, seizure d/o, chronic HF (systolic),who is being seen today for the evaluation of CHBat the request of Dr. Aundra Dubin.  - Admitted for CHB.  - Echo with EF 35-40%, inferior and lateral HK, normal RV.  - Coronary angiography: 60-70% dLM, 60-70% ostial LCx, 99% proximal calcified LCx (large/codominant LCx) - 4/4 S/p CABG x 4 (LIMA-LAD, SVG-OM2) - 4/5 Extubated   Assessment & Plan    1. CHB Pt conducting in 50s this am. She hasn't yet been able to ambulate, so unclear if her rates pick up.  Dr. [name] has seen the patient. We turned temp pacer down to 40 and [name] follow over the next few hours.  If continues to conduct, would be reasonable to continue to observe conduction through weekend.   2. CAD s/p CABG x 4 ((LIMA-LAD, SVG-OM2)) TCTS and HF clinic following and managing.  3. Elevated white count Now trending down to 12.2. Likely reactive post op.  T max 99.5 (4/7 @ 1114)  For questions or updates, please contact [place] Please consult [email or web address] for contact info under Cardiology/STEMI.  Signed, Shirley Friar, PA-C  04/29/2020, 7:47 AM   I have seen and examined this patient with [name].  Agree with above, note added to reflect my findings.  On exam, RRR, no murmurs.  Patient temporary wire was turned down and she had conduction with sinus rates in the 50s but with sinus rates in the 90s, no conduction was seen. [name] plan for CRT-P implant.  [name] has presented today for surgery, with the diagnosis of complete AV block.  The various methods of treatment have been discussed with the patient  and family. After consideration of risks, benefits and other options for treatment, the patient has consented to  Procedure(s): Pacemaker implant as a surgical intervention .  Risks include but not limited to bleeding, infection, pneumothorax, perforation, tamponade, vascular damage, renal failure, MI, stroke, death, and lead dislodgement . The patient's history has been reviewed, patient examined, no change in status, stable for surgery.  I have reviewed the  patient's chart and labs.  Questions were answered to the patient's satisfaction.    Valerie Dacosta Curt Bears, MD 04/29/2020 11:43 AM    Valerie Thayer M. Onalee Steinbach MD 04/29/2020 7:55 AM

## 2020-04-29 NOTE — Progress Notes (Signed)
Occupational Therapy Treatment Patient Details Name: [name] MRN: [id number] DOB: May 22, 1950 Today's Date: 04/29/2020    History of present illness 70 y.o. female admitted 3/31 after syncope at home and found to have complete heart block. Pt s/p CABG x 2 4/4. PMhx: HTN, HLD, stroke (expressive aphasia and R sided deficit), sickle cell anemia, seizure d/o, chronic HF (systolic).   OT comments  Patient with incremental gains towards patient focused OT goals.  She is still needing mod A for basic mobility and transfers, primarily due to restrictions to LUE s/p CABG.  She uses a hemi walker at home to mobilize, will need to get clarification if it is ok to trial this for transfers, otherwise the spouse will need to complete Mod A squat pivots.  OT to continue efforts in the acute setting to maximize her functional status.  She prefers to return home, SNF versus home will depend on spouses ability to transfer her.    Follow Up Recommendations  SNF    Equipment Recommendations  Other (comment)    Recommendations for Other Services      Precautions / Restrictions Precautions Precautions: Sternal;Fall Precaution Comments: aphasia, right hemiparesis with contracted RUE, foot drop and extension of RLE, external pacer Restrictions Other Position/Activity Restrictions: External defib       Mobility Bed Mobility Overal bed mobility: Needs Assistance Bed Mobility: Supine to Sit     Supine to sit: Min assist;Mod assist          Transfers Overall transfer level: Needs assistance   Transfers: Sit to/from Stand;Stand Pivot Transfers Sit to Stand: Mod assist   Squat pivot transfers: +2 safety/equipment;Mod assist          Balance Overall balance assessment: Needs assistance Sitting-balance support: Feet supported Sitting balance-Leahy Scale: Fair     Standing balance support: Single extremity supported Standing balance-Leahy Scale: Poor Standing balance comment:  single UE and support of belt to stand                           ADL either performed or assessed with clinical judgement   ADL   Eating/Feeding: Set up;Sitting   Grooming: Wash/dry hands;Wash/dry face;Oral care;Sitting;Set up                                                         Cognition  No changes                                                                Pertinent Vitals/ Pain       Pain Assessment: Faces Faces Pain Scale: No hurt Pain Intervention(s): Monitored during session                                                          Frequency  Min 2X/week        Progress Toward Goals  OT Goals(current goals can now be found in the care plan section)     Acute Rehab OT Goals Patient Stated Goal: patient nods to "going home" OT Goal Formulation: With patient Time For Goal Achievement: 05/11/20 Potential to Achieve Goals: Fair  Plan      Co-evaluation                 AM-PAC OT "6 Clicks" Daily Activity     Outcome Measure   Help from another person eating meals?: None Help from another person taking care of personal grooming?: A Little Help from another person toileting, which includes using toliet, bedpan, or urinal?: A Lot Help from another person bathing (including washing, rinsing, drying)?: A Lot Help from another person to put on and taking off regular upper body clothing?: A Lot Help from another person to put on and taking off regular lower body clothing?: A Lot 6 Click Score: 15    End of Session Equipment Utilized During Treatment: Gait belt;Oxygen  OT Visit Diagnosis: Unsteadiness on feet (R26.81);Muscle weakness (generalized) (M62.81);Other abnormalities of gait and mobility (R26.89);Hemiplegia and hemiparesis Hemiplegia - Right/Left: Right Hemiplegia - caused by: Cerebral infarction   Activity Tolerance Patient tolerated treatment well    Patient Left in chair;with call bell/phone within reach;with chair alarm set   Nurse Communication Mobility status        Time: 3154-0086 OT Time Calculation (min): 13 min  Charges: OT General Charges $OT Visit: 1 Visit OT Treatments $Self Care/Home Management : 8-22 mins  04/29/2020  Rich, OTR/L  Acute Rehabilitation Services  Office:  [phone]    Metta Clines 04/29/2020, 10:00 AM

## 2020-04-29 NOTE — Discharge Instructions (Signed)
Discharge Instructions:  1. You may shower, please wash incisions daily with soap and water and keep dry.  If you wish to cover wounds with dressing you may do so but please keep clean and change daily.  No tub baths or swimming until incisions have completely healed.  If your incisions become red or develop any drainage please call our office at [phone]  2. No Driving until cleared by Dr. Leonarda Salon office and you are no longer using narcotic pain medications  3. Monitor your weight daily.. Please use the same scale and weigh at same time... If you gain 3-5 lbs in 48 hours with associated lower extremity swelling, please contact our office at [phone]  4. Fever of 101.5 for at least 24 hours with no source, please contact our office at [phone]  5. Activity- up as tolerated, please walk at least 3 times per day.  Avoid strenuous activity, no lifting, pushing, or pulling with your arms over 8-10 lbs for a minimum of 6 weeks  6. If any questions or concerns arise, please do not hesitate to contact our office at [phone]  After Your Pacemaker   . You have a [employer] Pacemaker  ACTIVITY . Do not lift your arm above shoulder height for 1 week after your procedure. After 7 days, you may progress as below.  . You should remove your sling 24 hours after your procedure, unless otherwise instructed by your provider.     Friday May 06, 2020  Saturday May 07, 2020 Sunday May 08, 2020 Monday May 09, 2020   . Do not lift, push, pull, or carry anything over 10 pounds with the affected arm until 6 weeks (Friday Jun 10, 2020 ) after your procedure.   . You may drive AFTER your wound check, unless you have been told otherwise by your provider.   . Ask your healthcare provider when you can go back to work   INCISION/Dressing . If you are on a blood thinner such as Coumadin, Xarelto, Eliquis, Plavix, or Pradaxa please confirm with your provider when this should be resumed.    . If large square, outer bandage is left in place, this can be removed after 24 hours from your procedure. Do not remove steri-strips or glue as below.   . Monitor your Pacemaker site for redness, swelling, and drainage. Call the device clinic at [phone] if you experience these symptoms or fever/chills.  . If your incision is sealed with Steri-strips or staples, you may shower 10 days after your procedure or when told by your provider. Do not remove the steri-strips or let the shower hit directly on your site. You may wash around your site with soap and water.    Marland Kitchen Avoid lotions, ointments, or perfumes over your incision until it is well-healed.  . You may use a hot tub or a pool AFTER your wound check appointment if the incision is completely closed.  Marland Kitchen PAcemaker Alerts:  Some alerts are vibratory and others beep. These are NOT emergencies. Please call our office to let us know. If this occurs at night or on weekends, it can wait until the next business day. Send a remote transmission.  . If your device is capable of reading fluid status (for heart failure), you will be offered monthly monitoring to review this with you.   DEVICE MANAGEMENT . Remote monitoring is used to monitor your pacemaker from home. This monitoring is scheduled every 91 days by our office. It allows [place] to  keep an eye on the functioning of your device to ensure it is working properly. You will routinely see your Electrophysiologist annually (more often if necessary).   . You should receive your ID card for your new device in 4-8 weeks. Keep this card with you at all times once received. Consider wearing a medical alert bracelet or necklace.  . Your Pacemaker may be MRI compatible. This will be discussed at your next office visit/wound check.  You should avoid contact with strong electric or magnetic fields.    Do not use amateur (ham) radio equipment or electric (arc) welding torches. MP3 player headphones with  magnets should not be used. Some devices are safe to use if held at least 12 inches (30 cm) from your Pacemaker. These include power tools, lawn mowers, and speakers. If you are unsure if something is safe to use, ask your health care provider.   When using your cell phone, hold it to the ear that is on the opposite side from the Pacemaker. Do not leave your cell phone in a pocket over the Pacemaker.   You may safely use electric blankets, heating pads, computers, and microwave ovens.  Call the office right away if:  You have chest pain.  You feel more short of breath than you have felt before.  You feel more light-headed than you have felt before.  Your incision starts to open up.  This information is not intended to replace advice given to you by your health care provider. Make sure you discuss any questions you have with your health care provider.

## 2020-04-29 NOTE — TOC Progression Note (Signed)
Transition of Care [facility]) - Progression Note    Patient Details  Name: [name] MRN: [id number] Date of Birth: January 30, 1950  Transition of Care [facility]) CM/SW [place], [place] Phone Number: 04/29/2020, 11:02 AM  Clinical Narrative:     CSW spoke with patient at bedside and provided SNF bed offers to patient. CSW provided patient with medicare compare nursing facilities ratings list. CSW will follow back up with patient for SNF choice.CSW will continue to assist with patients discharge planning needs.    Expected Discharge Plan: [place] Barriers to Discharge: Continued Medical Work up  Expected Discharge Plan and Services Expected Discharge Plan: [place] In-house Referral: Clinical Social Work     Living arrangements for the past 2 months: Single Family Home                                       Social Determinants of Health (SDOH) Interventions    Readmission Risk Interventions No flowsheet data found.

## 2020-04-30 ENCOUNTER — Inpatient Hospital Stay: Payer: Self-pay

## 2020-04-30 ENCOUNTER — Inpatient Hospital Stay (HOSPITAL_COMMUNITY): Payer: Medicare HMO

## 2020-04-30 DIAGNOSIS — I442 Atrioventricular block, complete: Secondary | ICD-10-CM

## 2020-04-30 DIAGNOSIS — I5022 Chronic systolic (congestive) heart failure: Secondary | ICD-10-CM | POA: Diagnosis not present

## 2020-04-30 LAB — BASIC METABOLIC PANEL
Anion gap: 10 (ref 5–15)
Anion gap: 9 (ref 5–15)
BUN: 18 mg/dL (ref 8–23)
BUN: 14 mg/dL (ref 8–23)
CO2: 21 mmol/L — ABNORMAL LOW (ref 22–32)
CO2: 24 mmol/L (ref 22–32)
Calcium: 8.2 mg/dL — ABNORMAL LOW (ref 8.9–10.3)
Calcium: 8.3 mg/dL — ABNORMAL LOW (ref 8.9–10.3)
Chloride: 106 mmol/L (ref 98–111)
Chloride: 103 mmol/L (ref 98–111)
Creatinine, Ser: 0.82 mg/dL (ref 0.44–1.00)
Creatinine, Ser: 0.88 mg/dL (ref 0.44–1.00)
GFR, Estimated: >60 mL/min (ref >60)
GFR, Estimated: >60 mL/min (ref >60)
Glucose, Bld: 95 mg/dL (ref 70–99)
Glucose, Bld: 135 mg/dL — ABNORMAL HIGH (ref 70–99)
Potassium: 6.2 mmol/L — ABNORMAL HIGH (ref 3.5–5.1)
Potassium: 3.5 mmol/L (ref 3.5–5.1)
Sodium: 137 mmol/L (ref 135–145)
Sodium: 136 mmol/L (ref 135–145)

## 2020-04-30 LAB — CBC
HCT: 27.8 % — ABNORMAL LOW (ref 36.0–46.0)
Hemoglobin: 9.3 g/dL — ABNORMAL LOW (ref 12.0–15.0)
MCH: 29.2 pg (ref 26.0–34.0)
MCHC: 33.5 g/dL (ref 30.0–36.0)
MCV: 87.1 fL (ref 80.0–100.0)
Platelets: 237 10*3/uL (ref 150–400)
RBC: 3.19 MIL/uL — ABNORMAL LOW (ref 3.87–5.11)
RDW: 14.5 % (ref 11.5–15.5)
WBC: 10.3 10*3/uL (ref 4.0–10.5)
nRBC: 0 % (ref 0.0–0.2)

## 2020-04-30 LAB — GLUCOSE, CAPILLARY
Glucose-Capillary: 126 mg/dL — ABNORMAL HIGH (ref 70–99)
Glucose-Capillary: 80 mg/dL (ref 70–99)
Glucose-Capillary: 92 mg/dL (ref 70–99)

## 2020-04-30 LAB — PROTIME-INR
INR: 1.3 — ABNORMAL HIGH (ref 0.8–1.2)
Prothrombin Time: 16 seconds — ABNORMAL HIGH (ref 11.4–15.2)

## 2020-04-30 MED ORDER — MIDAZOLAM HCL 2 MG/2ML IJ SOLN
INTRAMUSCULAR | Status: AC
  Administered 2020-04-30: 1 mg via INTRAVENOUS
  Filled 2020-04-30: qty 2

## 2020-04-30 MED ORDER — FUROSEMIDE 40 MG PO TABS
40.0000 mg | ORAL_TABLET | Freq: Every day | ORAL | Status: DC
  Administered 2020-04-30 – 2020-05-02 (×3): 40 mg via ORAL
  Filled 2020-04-30: qty 1
  Filled 2020-04-30 (×2): qty 2

## 2020-04-30 MED ORDER — MIDAZOLAM HCL 2 MG/2ML IJ SOLN: 1.0000 mg | Freq: Once | INTRAMUSCULAR | Status: AC

## 2020-04-30 MED ORDER — ASPIRIN EC 81 MG PO TBEC
81.0000 mg | DELAYED_RELEASE_TABLET | Freq: Every day | ORAL | Status: DC
  Administered 2020-04-30 – 2020-05-05 (×6): 81 mg via ORAL
  Filled 2020-04-30 (×6): qty 1

## 2020-04-30 MED ORDER — ENOXAPARIN SODIUM 30 MG/0.3ML ~~LOC~~ SOLN
30.0000 mg | SUBCUTANEOUS | Status: DC
  Administered 2020-04-30 – 2020-05-02 (×3): 30 mg via SUBCUTANEOUS
  Filled 2020-04-30 (×3): qty 0.3

## 2020-04-30 MED ORDER — WARFARIN SODIUM 2 MG PO TABS
4.0000 mg | ORAL_TABLET | Freq: Once | ORAL | Status: AC
  Administered 2020-04-30: 4 mg via ORAL
  Filled 2020-04-30: qty 2

## 2020-04-30 NOTE — Progress Notes (Signed)
Hemostasis achieved.R groin site soft.Pressure dressing applied.R pedal pulse palpable.VSS.Will continue to monitor closely.

## 2020-04-30 NOTE — Plan of Care (Signed)
  Problem: Education: Goal: Knowledge of General Education information will improve Description: Including pain rating scale, medication(s)/side effects and non-pharmacologic comfort measures Outcome: Progressing   Problem: Health Behavior/Discharge Planning: Goal: Ability to manage health-related needs will improve Outcome: Progressing   Problem: Clinical Measurements: Goal: Ability to maintain clinical measurements within normal limits will improve Outcome: Progressing Goal: Will remain free from infection Outcome: Progressing Goal: Diagnostic test results will improve Outcome: Progressing Goal: Respiratory complications will improve Outcome: Progressing Goal: Cardiovascular complication will be avoided Outcome: Progressing   Problem: Activity: Goal: Risk for activity intolerance will decrease Outcome: Progressing   Problem: Nutrition: Goal: Adequate nutrition will be maintained Outcome: Progressing   Problem: Coping: Goal: Level of anxiety will decrease Outcome: Progressing   Problem: Elimination: Goal: Will not experience complications related to bowel motility Outcome: Progressing Goal: Will not experience complications related to urinary retention Outcome: Progressing   Problem: Pain Managment: Goal: General experience of comfort will improve Outcome: Progressing   Problem: Safety: Goal: Ability to remain free from injury will improve Outcome: Progressing   Problem: Skin Integrity: Goal: Risk for impaired skin integrity will decrease Outcome: Progressing   Problem: Education: Goal: Understanding of CV disease, CV risk reduction, and recovery process will improve Outcome: Progressing   Problem: Activity: Goal: Ability to return to baseline activity level will improve Outcome: Progressing   Problem: Cardiovascular: Goal: Ability to achieve and maintain adequate cardiovascular perfusion will improve Outcome: Progressing Goal: Vascular access site(s)  Level 0-1 will be maintained Outcome: Progressing   Problem: Health Behavior/Discharge Planning: Goal: Ability to safely manage health-related needs after discharge will improve Outcome: Progressing   Problem: Education: Goal: Understanding of CV disease, CV risk reduction, and recovery process will improve Outcome: Progressing Goal: Individualized Educational Video(s) Outcome: Progressing   Problem: Activity: Goal: Ability to return to baseline activity level will improve Outcome: Progressing   Problem: Cardiovascular: Goal: Ability to achieve and maintain adequate cardiovascular perfusion will improve Outcome: Progressing Goal: Vascular access site(s) Level 0-1 will be maintained Outcome: Progressing   Problem: Health Behavior/Discharge Planning: Goal: Ability to safely manage health-related needs after discharge will improve Outcome: Progressing

## 2020-04-30 NOTE — Progress Notes (Addendum)
Patient ID: [name], female   DOB: 1950/12/02, 70 y.o.   MRN: [id number]     Advanced Heart Failure Rounding Note  PCP-Cardiologist: Skeet Latch, MD  AHF: Dr. Aundra Dubin   Subjective:   - Admitted for CHB.  - Echo with EF 35-40%, inferior and lateral HK, normal RV.  - Coronary angiography: 60-70% dLM, 60-70% ostial LCx, 99% proximal calcified LCx (large/codominant LCx) - 4/4  S/p CABG x 4 (LIMA-LAD, SVG-OM2) - 4/5 Extubated - 4/8 St Jude CRT-P   No complaints today.  K 6.2 (unchanged creatinine, ?accuracy).   Objective:   Weight Range: 60.1 kg Body mass index is 20.75 kg/m.   Vital Signs:   Temp:  [97.6 F (36.4 C)-98.3 F (36.8 C)] 98.3 F (36.8 C) (04/09 0400) Pulse Rate:  [56-144] 99 (04/09 0400) Resp:  [15-77] 21 (04/09 0400) BP: (93-144)/(47-93) 95/60 (04/09 0400) SpO2:  [0 %-100 %] 96 % (04/09 0400) Last BM Date: 04/19/20  Weight change: Filed Weights   04/26/20 0500 04/27/20 0500 04/28/20 0500  Weight: 61.4 kg 59.8 kg 60.1 kg    Intake/Output:   Intake/Output Summary (Last 24 hours) at 04/30/2020 0708 Last data filed at 04/30/2020 0400 Gross per 24 hour  Intake 728.87 ml  Output 200 ml  Net 528.87 ml      Physical Exam    General: NAD Neck: No JVD, no thyromegaly or thyroid nodule.  Lungs: Clear to auscultation bilaterally with normal respiratory effort. CV: Nondisplaced PMI.  Heart regular S1/S2, no S3/S4, no murmur.  No peripheral edema.   Abdomen: Soft, nontender, no hepatosplenomegaly, no distention.  Skin: Intact without lesions or rashes.  Neurologic: Alert/follows commands.  R hemiparesis and aphasia from prior CVA  Psych: Normal affect. Extremities: No clubbing or cyanosis.  HEENT: Normal.    Telemetry   BiV paced  (personally reviewed)  Labs    CBC Recent Labs    04/29/20 0217 04/30/20 0519  WBC 12.2* 10.3  HGB 10.5* 9.3*  HCT 31.5* 27.8*  MCV 86.5 87.1  PLT 178 893   Basic Metabolic Panel Recent Labs     04/29/20 0217 04/30/20 0519  NA 137 137  K 3.8 6.2*  CL 100 106  CO2 27 21*  GLUCOSE 101* 95  BUN 15 18  CREATININE 0.92 0.82  CALCIUM 8.6* 8.2*   Liver Function Tests No results for input(s): AST, ALT, ALKPHOS, BILITOT, PROT, ALBUMIN in the last 72 hours. No results for input(s): LIPASE, AMYLASE in the last 72 hours. Cardiac Enzymes No results for input(s): CKTOTAL, CKMB, CKMBINDEX, TROPONINI in the last 72 hours.  BNP: BNP (last 3 results) No results for input(s): BNP in the last 8760 hours.  ProBNP (last 3 results) No results for input(s): PROBNP in the last 8760 hours.   D-Dimer No results for input(s): DDIMER in the last 72 hours. Hemoglobin A1C No results for input(s): HGBA1C in the last 72 hours. Fasting Lipid Panel No results for input(s): CHOL, HDL, LDLCALC, TRIG, CHOLHDL, LDLDIRECT in the last 72 hours. Thyroid Function Tests No results for input(s): TSH, T4TOTAL, T3FREE, THYROIDAB in the last 72 hours.  Invalid input(s): FREET3  Other results:   Imaging    EP PPM/ICD IMPLANT  Result Date: 04/29/2020 SURGEON: Allegra Lai, MD PREPROCEDURE DIAGNOSES: 1. Ischemic cardiomyopathy. 2. New York Heart Association class II, heart failure chronically. 3. Left bundle-branch block. POSTPROCEDURE DIAGNOSES: 1. Ischemic cardiomyopathy. 2. New York Heart Association class II heart failure chronically. 3. Left bundle-branch block. PROCEDURES: 1.  Left upper extremity venography 2. Biventricular pacemaker implantation. INTRODUCTION:  [name] is a 71 y.o. female with a ischemic CM (EF 30/35%), NYHA Class II CHF, and LBBB QRS morophology.  She presented to hospital with complete heart block.  She had a left heart catheterization that showed significant coronary artery disease and now status post CABG.  She remains in complete heart block.  She thus presents for CRT-P implantation. DESCRIPTION OF PROCEDURE: Informed written consent was obtained and the patient was  brought to the electrophysiology lab in the fasting state. The patient was adequately sedated with intravenous Versed, and fentanyl as outlined in the nursing report. The patient's left chest was prepped and draped in the usual sterile fashion by the EP lab staff. The skin overlying the left deltopectoral region was infiltrated with lidocaine for local analgesia. A 5-cm incision was made over the left deltopectoral region. A left subcutaneous pacemaker pocket was fashioned using a combination of sharp and blunt dissection. Electrocautery was used to assure hemostasis. RA/RV Lead Placement: The left axillary vein was cannulated with fluoroscopic visualization. No contrast was required for this endeavor. Through the left axillary vein, and Abbott tendril MRI LPA 1200 M (serial #[id number]) right atrial lead and an Abbott tendril MRI LPA 1200 M (serial number [id number]) right ventricular pacing lead were advanced with fluoroscopic visualization into the right atrial appendage and right ventricular apex positions respectively. Initial atrial lead P-waves measured 1.7 mV with an impedance of 404 ohms and a threshold of 2.1 volts at 0.5 milliseconds. The right ventricular lead R-wave measured 10 mV with impedance of 716 ohms and a threshold of 0.8 volts at 0.5 milliseconds. LV Lead Placement: An Abbott 115 guide was advanced through the left axillary vein into the low lateral right atrium. A Bard curved Damato catheter was introduced through the guide and used to cannulate the coronary sinus.  A selective coronary sinus venogram was performed by hand injection of nonionic contrast. This demonstrated a large CS body with very small/ atretic distal branches. There was a moderate sized lateral coronary sinus branch was noted along the mid portion of the CS body. No other posterior branches were identified. A Whisper CSJ wire was introduced through the guide and advanced into the distal posterolateral branch. A Saint Jude  [id number] (serial number [facility] [id number]) lead was advanced through the MB-2 into the lateral branch. This was approximately one-thirds from the base to the apex in a very lateral position. In this location, the left ventricular lead R-waves measured 4.6 mV with impedance of 702 ohms and a threshold of 1.1 volt at 0.5 milliseconds in the bipolar LV2-LV3 configuration with no diaphragmatic stimulation observed when pacing at 10 volts output. The guide was therefore removed. All three leads were secured to the pectoralis fascia using #2 silk suture over the suture sleeves. The pocket then irrigated with copious gentamicin solution. The leads were then connected to Folcroft (serial Number [id number]) device. The pacemaker was placed into the pocket. The pocket was then closed in 3 layers with 2.0 Vicryl suture for the subcutaneous and 3.0 Vicryl suture subcuticular layers. Steri-Strips and a sterile dressing were then applied. DFT testing was not performed today. The procedure was therefore considered completed. EBL<72ml. There were no early apparhent complications. CONCLUSIONS: 1. Ischemic cardiomyopathy with Left bundle-branch block and chronic New York Heart Association class II heart failure. 2. Successful biventricular pacemaker implantation. 3. No early apparent complications.   ECHOCARDIOGRAM COMPLETE  Result Date: 04/29/2020  ECHOCARDIOGRAM REPORT   Patient Name:   [name] Date of Exam: 04/29/2020 Medical Rec #:  [id number]            Height:       67.0 in Accession #:    [id number]           Weight:       132.5 lb Date of Birth:  04/12/1950           BSA:          1.698 m Patient Age:    71 years             BP:           109/61 mmHg Patient Gender: F                    HR:           52 bpm. Exam Location:  Inpatient Procedure: 2D Echo and Intracardiac Opacification Agent STAT ECHO Indications:    CHF; Needs echo before Pacemaker placement.  History:        Patient has prior history of  Echocardiogram examinations, most                 recent 04/25/2020. CHF, Prior CABG, Stroke; Risk                 Factors:Hypertension and Dyslipidemia. Sickle Cell Anemia.  Sonographer:    Darlina Sicilian RDCS Referring Phys: [place]  1. Left ventricular ejection fraction, by estimation, is 30 to 35%. The left ventricle has moderately decreased function. The left ventricle demonstrates regional wall motion abnormalities (see scoring diagram/findings for description). Inferior/lateral  akinesis. Left ventricular diastolic parameters are indeterminate.  2. Right ventricular systolic function is severely reduced. The right ventricular size is normal. There is normal pulmonary artery systolic pressure.  3. The mitral valve is normal in structure. Trivial mitral valve regurgitation.  4. The aortic valve is tricuspid. Aortic valve regurgitation is moderate. Mild aortic valve sclerosis is present, with no evidence of aortic valve stenosis. FINDINGS  Left Ventricle: Left ventricular ejection fraction, by estimation, is 30 to 35%. The left ventricle has moderately decreased function. The left ventricle demonstrates regional wall motion abnormalities. Definity contrast agent was given IV to delineate the left ventricular endocardial borders. The left ventricular internal cavity size was normal in size. There is no left ventricular hypertrophy. Left ventricular diastolic parameters are indeterminate.  LV Wall Scoring: The entire lateral wall and entire inferior wall are akinetic. The apex is hypokinetic. The entire anterior wall and entire septum are normal. Right Ventricle: The right ventricular size is normal. Right vetricular wall thickness was not well visualized. Right ventricular systolic function is severely reduced. There is normal pulmonary artery systolic pressure. The tricuspid regurgitant velocity is 2.43 m/s, and with an assumed right atrial pressure of 8 mmHg, the estimated right  ventricular systolic pressure is 97.3 mmHg. Left Atrium: Left atrial size was normal in size. Right Atrium: Right atrial size was normal in size. Pericardium: There is no evidence of pericardial effusion. Mitral Valve: The mitral valve is normal in structure. Trivial mitral valve regurgitation. Tricuspid Valve: The tricuspid valve is normal in structure. Tricuspid valve regurgitation is trivial. Aortic Valve: The aortic valve is tricuspid. Aortic valve regurgitation is moderate. Aortic regurgitation PHT measures 630 msec. Mild aortic valve sclerosis is present, with no evidence of aortic valve stenosis. Pulmonic Valve: The pulmonic valve was not  well visualized. Pulmonic valve regurgitation is not visualized. Aorta: The aortic root and ascending aorta are structurally normal, with no evidence of dilitation. IAS/Shunts: The interatrial septum was not well visualized.  LEFT VENTRICLE PLAX 2D LVIDd:         3.30 cm      Diastology LVIDs:         3.10 cm      LV e' medial:    7.18 cm/s LV PW:         1.00 cm      LV E/e' medial:  14.3 LV IVS:        1.00 cm      LV e' lateral:   6.69 cm/s LVOT diam:     1.90 cm      LV E/e' lateral: 15.4 LV SV:         47 LV SV Index:   28 LVOT Area:     2.84 cm  LV Volumes (MOD) LV vol d, MOD A2C: 117.0 ml LV vol d, MOD A4C: 163.0 ml LV vol s, MOD A2C: 82.6 ml LV vol s, MOD A4C: 110.0 ml LV SV MOD A2C:     34.4 ml LV SV MOD A4C:     163.0 ml LV SV MOD BP:      48.8 ml RIGHT VENTRICLE RV S prime:     5.66 cm/s LEFT ATRIUM             Index       RIGHT ATRIUM           Index LA diam:        1.50 cm 0.88 cm/m  RA Area:     11.20 cm LA Vol (A2C):   30.1 ml 17.73 ml/m RA Volume:   25.60 ml  15.08 ml/m LA Vol (A4C):   24.7 ml 14.55 ml/m LA Biplane Vol: 28.4 ml 16.73 ml/m  AORTIC VALVE LVOT Vmax:   105.00 cm/s LVOT Vmean:  61.400 cm/s LVOT VTI:    0.165 m AI PHT:      630 msec  AORTA Ao Root diam: 3.00 cm Ao Asc diam:  2.70 cm MITRAL VALVE                TRICUSPID VALVE MV Area (PHT):  6.48 cm     TR Peak grad:   23.6 mmHg MV Decel Time: 117 msec     TR Vmax:        243.00 cm/s MV E velocity: 103.00 cm/s MV A velocity: 82.50 cm/s   SHUNTS MV E/A ratio:  1.25         Systemic VTI:  0.16 m                             Systemic Diam: 1.90 cm Oswaldo Milian MD Electronically signed by Oswaldo Milian MD Signature Date/Time: 04/29/2020/9:55:55 AM    Final      Medications:     Scheduled Medications: . sodium chloride   Intravenous Once  . acetaminophen  1,000 mg Oral Q6H   Or  . acetaminophen (TYLENOL) oral liquid 160 mg/5 mL  1,000 mg Per Tube Q6H  . aspirin EC  325 mg Oral Daily   Or  . aspirin  324 mg Per Tube Daily  . atorvastatin  80 mg Oral QHS  . baclofen  20 mg Oral BID  . bisacodyl  10 mg Oral Daily   Or  .  bisacodyl  10 mg Rectal Daily  . Chlorhexidine Gluconate Cloth  6 each Topical Daily  . docusate sodium  200 mg Oral Daily  . furosemide  40 mg Oral Daily  . insulin aspart  0-9 Units Subcutaneous TID WC  . lacosamide  100 mg Oral BID  . levETIRAcetam  1,500 mg Oral BID  . mouth rinse  15 mL Mouth Rinse BID  . pantoprazole  40 mg Oral Daily  . sodium chloride flush  3 mL Intravenous Q12H  . Warfarin - Pharmacist Dosing Inpatient   Does not apply q1600    Infusions: . sodium chloride    . sodium chloride    . sodium chloride    . sodium chloride 50 mL/hr at 04/30/20 0400  .  ceFAZolin (ANCEF) IV 1 g (04/30/20 0532)  . lactated ringers    . lactated ringers Stopped (04/28/20 0801)    PRN Medications: sodium chloride, acetaminophen, midazolam, morphine injection, ondansetron (ZOFRAN) IV, oxyCODONE, polyethylene glycol, sodium chloride flush, traMADol   Assessment/Plan   1. Complete heart block: Patient had baseline NSR with LAFB/RBBB, so there was underlying conduction system disease.  Suspect this progressed to CHB with beta blocker/digoxin use + chronic ischemia in territory of large  co-dominant LCx.  CHB recurred after CABG, now s/p  St Jude CRT-P.  - Can eventually resume HF beta blocker.  2. Chronic systolic CHF: Most recent prior echo in 8/20 with EF 35-40% and wall motion abnormalities.  Ischemic cardiomyopathy with left main/severe codominant LCx disease.  Echo this admission with stable EF 35-40%, inferior and lateral hypokinesis.  Repeat echo after CABG fairly stable EF 30-35%.  Not significantly volume overloaded on exam. Per her husband, at baseline she is aphasic but can walk around the house and do her ADLs. Now s/p CABG 4/4. Off inotropes/pressors.  - Start Lasix 40 mg po daily.     - Stop spironolactone with ?hyperkalemia => repeat BMET stat to see if this is real given no change in creatinine.   3. 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  Has been on warfarin.  Has had long-standing aphasia and right hemiparesis.   - Warfarin resumed by surgery.  4. H/o seizure disorder: Continue Keppra.  5. ?Sickle cell anemia: Carries this diagnosis but not sure truly present.  6. CAD:  LHC showed 60-70% distal left main stenosis, heavily calcified proximal LCx with 60-70% stenosis at the ostium and discrete 99% stenosis in the proximal LCx.  I suspect that her CHB is related to chronic ischemia from LM-LCx disease worsened by nodal blockade.  Difficult situation.  She has surgical disease but will be more difficult to rehab after CABG with weakness from prior stroke though per her husband, she does all ADLs and walks with cane at home.  PCI would be possible but high risk/difficult heavy calcification and tortuousity. Had CABG x 2 4/4 w/ LIMA-LAD, SVG-OM2  - Continue ASA 81, statin.  7. Needs aggressive PT work, out of bed.  Will need SNF.   Length of Stay: 9  Loralie Champagne, MD  04/30/2020, 7:08 AM  Advanced Heart Failure Team Pager [phone] (M-F; 7a - 5p)  Please contact [facility] for night-coverage after hours (5p -7a ) and weekends on [email or web address]

## 2020-04-30 NOTE — Progress Notes (Signed)
Progress Note   Subjective   Not verbal currently.  Tearful and declining blood draw.  Inpatient Medications    Scheduled Meds: . sodium chloride   Intravenous Once  . acetaminophen  1,000 mg Oral Q6H   Or  . acetaminophen (TYLENOL) oral liquid 160 mg/5 mL  1,000 mg Per Tube Q6H  . aspirin EC  325 mg Oral Daily   Or  . aspirin  324 mg Per Tube Daily  . atorvastatin  80 mg Oral QHS  . baclofen  20 mg Oral BID  . bisacodyl  10 mg Oral Daily   Or  . bisacodyl  10 mg Rectal Daily  . Chlorhexidine Gluconate Cloth  6 each Topical Daily  . docusate sodium  200 mg Oral Daily  . furosemide  40 mg Oral Daily  . insulin aspart  0-9 Units Subcutaneous TID WC  . lacosamide  100 mg Oral BID  . levETIRAcetam  1,500 mg Oral BID  . mouth rinse  15 mL Mouth Rinse BID  . pantoprazole  40 mg Oral Daily  . sodium chloride flush  3 mL Intravenous Q12H  . Warfarin - Pharmacist Dosing Inpatient   Does not apply q1600   Continuous Infusions: . sodium chloride    . sodium chloride    . sodium chloride    . sodium chloride 50 mL/hr at 04/30/20 0400  .  ceFAZolin (ANCEF) IV 1 g (04/30/20 0532)  . lactated ringers    . lactated ringers Stopped (04/28/20 0801)   PRN Meds: sodium chloride, acetaminophen, midazolam, morphine injection, ondansetron (ZOFRAN) IV, oxyCODONE, polyethylene glycol, sodium chloride flush, traMADol   Vital Signs    Vitals:   04/30/20 0400 04/30/20 0500 04/30/20 0600 04/30/20 0744  BP: 95/60 99/67 102/68   Pulse: 99 (!) 101 94   Resp: (!) 21 (!) 21 19   Temp: 98.3 F (36.8 C)   98 F (36.7 C)  TempSrc: Oral     SpO2: 96% 96% 96%   Weight:      Height:        Intake/Output Summary (Last 24 hours) at 04/30/2020 0755 Last data filed at 04/30/2020 0700 Gross per 24 hour  Intake 728.87 ml  Output 550 ml  Net 178.87 ml   Filed Weights   04/26/20 0500 04/27/20 0500 04/28/20 0500  Weight: 61.4 kg 59.8 kg 60.1 kg    Telemetry    Sinus with BiV pacing -  Personally Reviewed  Physical Exam   GEN- The patient is chronically ill appearing, alert but not verbal this am Head- normocephalic, atraumatic Eyes-  Sclera clear, conjunctiva pink Ears- hearing intact Oropharynx- clear Neck- supple, Lungs-  normal work of breathing Heart- Regular rate and rhythm   Pacemaker pocket is without hematoma   Labs    Chemistry Recent Labs  Lab 04/28/20 0436 04/29/20 0217 04/30/20 0519  NA 133* 137 137  K 4.2 3.8 6.2*  CL 100 100 106  CO2 27 27 21*  GLUCOSE 93 101* 95  BUN 13 15 18   CREATININE 0.77 0.92 0.82  CALCIUM 8.2* 8.6* 8.2*  GFRNONAA >60 >60 >60  ANIONGAP 6 10 10      Hematology Recent Labs  Lab 04/28/20 0436 04/29/20 0217 04/30/20 0519  WBC 13.1* 12.2* 10.3  RBC 3.27* 3.64* 3.19*  HGB 9.6* 10.5* 9.3*  HCT 28.9* 31.5* 27.8*  MCV 88.4 86.5 87.1  MCH 29.4 28.8 29.2  MCHC 33.2 33.3 33.5  RDW 14.6 14.5 14.5  PLT  [place]     Patient ID  Valerie Giampietro Spainis a 70 y.o.femalewith a hx of HTN, HLD, stroke (expessive aphasia andR sided deficit), sickle cell anemia, seizure d/o, chronic HF (systolic),who is being seen today for the evaluation of CHBat the request of Dr. Aundra Dubin.  Assessment & Plan    1.  Intermittent complete heart block Doing well s/p BiV pacmaker yesterday CXR reveals stable leads, no ptx Device interrogation is personally reviewed and normal  2. CAD s/p CABG TCTS and HF clinic following and managing. Ok to resume beta blocker from EP standpoint  Routine wound care and follow-up EP to see as needed going forward Please call with questions  Thompson Grayer MD, [facility] 04/30/2020 7:55 AM

## 2020-04-30 NOTE — Procedures (Signed)
Central Venous Catheter Insertion Procedure Note  [name] [place]  [id number]  02/27/1950  Date:04/30/20  Time:7:14 PM   Provider Performing:Asberry Lascola   Procedure: Insertion of Non-tunneled Central Venous [phone]) with US guidance (07615)   Indication(s) Difficult access  Consent Risks of the procedure as well as the alternatives and risks of each were explained to the patient and/or caregiver.  Consent for the procedure was obtained and is signed in the bedside chart  Anesthesia Topical only with 1% lidocaine   Timeout Verified patient identification, verified procedure, site/side was marked, verified correct patient position, special equipment/implants available, medications/allergies/relevant history reviewed, required imaging and test results available.  Sterile Technique Maximal sterile technique including full sterile barrier drape, hand hygiene, sterile gown, sterile gloves, mask, hair covering, sterile ultrasound probe cover (if used).  Procedure Description Area of catheter insertion was cleaned with chlorhexidine and draped in sterile fashion.  With real-time ultrasound guidance a 7 F 3Lumen 20cm central venous catheter was placed into the left femoral vein. Nonpulsatile blood flow and easy flushing noted in all ports.  The catheter was sutured in place and sterile dressing applied.   Complications/Tolerance None; patient tolerated the procedure well. Chest X-ray is ordered to verify placement for internal jugular or subclavian cannulation.   Chest x-ray is not ordered for femoral cannulation.  EBL Minimal  Specimen(s) None  Kipp Brood, MD [facility] [place]  Pager: [phone] Or Epic Secure Chat After hours: [phone].  04/30/2020, 7:17 PM

## 2020-04-30 NOTE — Progress Notes (Signed)
Femoral central line placed by Dr. Lynetta Mare using sterile procedure and ultrasound guidance. Blood obtained for labs by this RN. Immediately after flushing and clamping central line site, this RN moved patients leg to clean femoral/perineal area and patient began to bleed bright red blood from newly inserted central line site. Agarwala MD at bedside. Central line site assessed by Agarwala MD. This RN and oncoming night shift RN, Ardelia Mems, to hold pressure for five minutes at the central line site per Agarwala MD, If bleeding persists longer than five minutes, remove central line and hold manual pressure for 20 minutes per Agarwala MD.   Manual pressure held for five minutes by Ardelia Mems, Therapist, sports. Bleeding persists. Central line removed. Manual pressure will continue for 20 minutes.   Night shift RN will continue to assess and evaluate the central line site for bleeding, bruising, hematoma, and abnormalities.   Shift report given to night shift RN  Alma Friendly 04/30/20 1938

## 2020-04-30 NOTE — Progress Notes (Addendum)
AM labs indicated K 6.2. Phlebotomist, Ron, stated no blood return on previous venous sticks this AM for AM lab draw. Blood obtained from capillaries per Ron, phlebotomy. Redraw BMP orders per Dr. Aundra Dubin. Lab at bedside and patient is refused lab sticks. Aundra Dubin, MD notified. Patient educated on the importance and urgency of lab draw to treat potassium levels. Patient allowed phlebotomist to obtain a capillary stick but was unable to obtain blood from that site. Patient educated again of the importance of obtaining this lab value in order to properly treat potassium levels promptly if necessary and has agreed to allow phlebotomist to perform a venous blood draw.    0842: Phlebotomist unable to obtain blood sample from venous stick sites. Aundra Dubin MD notified.    Luberta Mutter RN

## 2020-04-30 NOTE — Progress Notes (Signed)
Physical Therapy Treatment Patient Details Name: [name] [place] MRN: [id number] DOB: [birth year] Today's Date: 04/30/2020    History of Present Illness 70 y.o. female admitted 3/31 after syncope at home and found to have complete heart block. Pt s/p CABG x 2 4/4. PPM placed 04/29/2020. PMhx: HTN, HLD, stroke (expressive aphasia and R sided deficit), sickle cell anemia, seizure d/o, chronic HF (systolic).    PT Comments    Pt is limited by reports of pain, refusing further transfer training or exercise after mobilizing out of bed. Pt continues to demonstrate memory and safety awareness deficits with poor recall of sternal precautions from previous sessions and within this session. Pt continues to require significant physical assistance to perform bed mobility and transfer safely. Pt will benefit from continued aggressive mobilization to improve mobility quality and to reduce falls risk. PT continues to recommend SNF placement at this time. Pt may benefit from use of picture board to improve communication. PT may also attempt to see patient with spouse present to assess how the two communicate at home.   Follow Up Recommendations  SNF;Supervision/Assistance - 24 hour     Equipment Recommendations  3in1 (PT);Hospital bed    Recommendations for Other Services       Precautions / Restrictions Precautions Precautions: Sternal;Fall;ICD/Pacemaker Precaution Booklet Issued: No Precaution Comments: aphasia, right hemiparesis with contracted RUE, foot drop and extension of RLE Restrictions Weight Bearing Restrictions: Yes Other Position/Activity Restrictions: sternal and pacemaker precautions    Mobility  Bed Mobility Overal bed mobility: Needs Assistance Bed Mobility: Supine to Sit     Supine to sit: Mod assist;HOB elevated     General bed mobility comments: pt requires assistance to mobilize RLE, to elevate trunk, and to pivot hips to edge of bed. Pt often attempting to push through  LUE despite PT cues for sternal precautions    Transfers Overall transfer level: Needs assistance Equipment used: 1 person hand held assist Transfers: Sit to/from [facility] Sit to Stand: Mod assist Stand pivot transfers: Mod assist;+2 physical assistance       General transfer comment: pt requires R knee block, R knee noted to hyperextend in standing, PT cues to hold heart pillow to maintain sternal precuautions. Pt refuses further standing attempts after initial SPT  Ambulation/Gait                 Stairs             Wheelchair Mobility    Modified Rankin (Stroke Patients Only)       Balance Overall balance assessment: Needs assistance Sitting-balance support: No upper extremity supported;Feet supported Sitting balance-Leahy Scale: Fair     Standing balance support: Single extremity supported Standing balance-Leahy Scale: Poor Standing balance comment: modA with PT support                            Cognition Arousal/Alertness: Awake/alert Behavior During Therapy: WFL for tasks assessed/performed Overall Cognitive Status: Difficult to assess                                 General Comments: pt with expressive aphasia at baseline, communicating by pointing to objects and with head nods, no family present to determine baseline. Difficult to assess pt's undrstanding of PT education, often needing PT cues for sternal precautions      Exercises      General  Comments General comments (skin integrity, edema, etc.): VSS on RA      Pertinent Vitals/Pain Pain Assessment: Faces Faces Pain Scale: Hurts little more Pain Location: RLE and chest Pain Descriptors / Indicators: Grimacing Pain Intervention(s): Monitored during session    Home Living                      Prior Function            PT Goals (current goals can now be found in the care plan section) Acute Rehab PT Goals Patient Stated Goal:  patient nods to "going home" Progress towards PT goals: Not progressing toward goals - comment (somewhat self-limiting, also reporting pain)    Frequency    Min 3X/week      PT Plan Current plan remains appropriate    Co-evaluation              AM-PAC PT "6 Clicks" Mobility   Outcome Measure  Help needed turning from your back to your side while in a flat bed without using bedrails?: A Lot Help needed moving from lying on your back to sitting on the side of a flat bed without using bedrails?: A Lot Help needed moving to and from a bed to a chair (including a wheelchair)?: A Lot Help needed standing up from a chair using your arms (e.g., wheelchair or bedside chair)?: A Lot Help needed to walk in hospital room?: Total Help needed climbing 3-5 steps with a railing? : Total 6 Click Score: 10    End of Session   Activity Tolerance: Other (comment) (self-limiting, pt refuses multiple transfer attempts or LE exercise) Patient left: in chair;with call bell/phone within reach;with chair alarm set Nurse Communication: Mobility status PT Visit Diagnosis: Other abnormalities of gait and mobility (R26.89);Difficulty in walking, not elsewhere classified (R26.2);Hemiplegia and hemiparesis Hemiplegia - Right/Left: Right Hemiplegia - caused by: Cerebral infarction     Time: 1275-1700 PT Time Calculation (min) (ACUTE ONLY): 26 min  Charges:  $Therapeutic Activity: 8-22 mins                     Zenaida Niece, PT, DPT Acute Rehabilitation Pager: [phone]    Zenaida Niece 04/30/2020, 3:12 PM

## 2020-04-30 NOTE — Progress Notes (Signed)
Went down for CXR.Tolerated procedure

## 2020-04-30 NOTE — Progress Notes (Signed)
1115: Contacted by PICC/IV team RN who needs verification from MD to place PICC line since PPM was placed 04/29/20.   1130: Dr. Rayann Heman agreed that he would like the line to be placed in IR. Awaiting orders.   1415: Follow up with Dr. Rayann Heman by this RN about orders to be placed for PICC insertion in IR   1500: Orders placed by Cardiology NP, Cecilie Kicks. IR contacted by this RN and was unavailable for procedure until Monday during normal IR hours. Cecilie Kicks NP notified.   1530: Orders placed for midline insertion per Dr. Aundra Dubin and Cecilie Kicks NP.    1700: IV team at bedside, attempted to place midline but unable to visualize clear vascular anatomy via ultrasound. Mclean MD and Cecilie Kicks NP notified.   1715: CCM called to come to bedside to place femoral central line per Aundra Dubin MD. Consent obtained.   1830: Central line placement sterile procedure in progress.   Luberta Mutter RN  04/30/20

## 2020-04-30 NOTE — Progress Notes (Signed)
[place] for Warfarin  Indication: stroke  Allergies  Allergen Reactions  . Lexiscan [Regadenoson] Other (See Comments)    Perioral tingling and throat tightness    Patient Measurements: Height: 5\' 7"  (170.2 cm) Weight: 60.1 kg (132 lb 7.9 oz) IBW/kg (Calculated) : 61.6 Heparin Dosing Weight: 57 kg  Vital Signs: Temp: 98 F (36.7 C) (04/09 0744) Temp Source: Oral (04/09 0400) BP: 102/68 (04/09 0600) Pulse Rate: 94 (04/09 0600)  Labs: Recent Labs    04/28/20 0436 04/29/20 0217 04/30/20 0300 04/30/20 0519  HGB 9.6* 10.5*  --  9.3*  HCT 28.9* 31.5*  --  27.8*  PLT 132* 178  --  237  LABPROT 15.2 15.0 16.0*  --   INR 1.3* 1.2 1.3*  --   CREATININE 0.77 0.92  --  0.82    Estimated Creatinine Clearance: 59.7 mL/min (by C-G formula based on SCr of 0.82 mg/dL).   Medical History: Past Medical History:  Diagnosis Date  . Adenomatous polyp of colon 09/2012   repeat colonoscopy in 5 years by Dr. Sharlett Iles  . CHF (congestive heart failure) (HCC)    EF 15-20% as of 05/28/11 (Dr Legrand Como [facility]-Hospital D/C summary)  . CVA (cerebral infarction) 12/2007  . Hemiparesis ([place])   . Hyperlipidemia   . Hypertension   . Seizures ([place])   . Sickle cell anemia (HCC)   . Stroke ([place])   . Vitamin D deficiency 2010    Assessment: 70 yo female presents with CHB now s/p temporary pacer. On warfarin PTA for hx embolic stroke, no known hx of afib. INR on admit therapeutic at 2.2. LHC on 4/1 shows LM-LCx disease, PCI held for CABG consult. Pharmacy asked start heparin 8 hr after sheath pull and when INR <2.   PTA warfarin regimen: 5 mg daily except 7.5 mg Mon/Thurs INR on admit: 2.2  Warfarin resumed last night, INR remains low today as expected. No overt bleeding or complications noted.  CBC stable.  Goal of Therapy: Heparin level 0.3-0.7  INR 2-3 Monitor platelets by anticoagulation protocol: Yes   Plan:  Warfarin 4 mg x 1 tonight. Daily  INR. Resume Enoxaparin 30 mg daily until INR > 2 per EP.  [place] Crane, Roylene Reason, BCCP Clinical Pharmacist  04/30/2020 10:52 AM   [facility] pharmacy phone numbers are listed on [email or web address]

## 2020-04-30 NOTE — Progress Notes (Addendum)
[address]       [place] [address]             [phone]        CARDIOTHORACIC SURGERY PROGRESS NOTE   R1 Day Post-Op Procedure(s) (LRB): BIV PACEMAKER INSERTION CRT-P (N/A)  Subjective: Looks good.  Breathing comfortably on room air.  Denies pain.  Ate breakfast.  Wants another cup of coffee  Objective: Vital signs: BP Readings from Last 1 Encounters:  04/30/20 102/68   Pulse Readings from Last 1 Encounters:  04/30/20 94   Resp Readings from Last 1 Encounters:  04/30/20 19   Temp Readings from Last 1 Encounters:  04/30/20 98 F (36.7 C)    Hemodynamics:    Physical Exam:  Rhythm:   AV paced  Breath sounds: Diminished at bases  Heart sounds:  RRR  Incisions:  Clean and dry  Abdomen:  Soft, non-distended, non-tender  Extremities:  Warm, well-perfused    Intake/Output from previous day: 04/08 0701 - 04/09 0700 In: 728.9 [P.O.:100; I.V.:578.8; IV Piggyback:50] Out: 550 [Urine:550] Intake/Output this shift: No intake/output data recorded.  Lab Results:  CBC: Recent Labs    04/29/20 0217 04/30/20 0519  WBC 12.2* 10.3  HGB 10.5* 9.3*  HCT 31.5* 27.8*  PLT 178 237    BMET:  Recent Labs    04/29/20 0217 04/30/20 0519  NA 137 137  K 3.8 6.2*  CL 100 106  CO2 27 21*  GLUCOSE 101* 95  BUN 15 18  CREATININE 0.92 0.82  CALCIUM 8.6* 8.2*     PT/INR:   Recent Labs    04/30/20 0300  LABPROT 16.0*  INR 1.3*    CBG (last 3)  Recent Labs    04/29/20 1230 04/29/20 1525 04/30/20 0729  GLUCAP 129* 102* 126*    ABG    Component Value Date/Time   PHART 7.378 04/26/2020 0934   PCO2ART 37.4 04/26/2020 0934   PO2ART 124 (H) 04/26/2020 0934   HCO3 21.9 04/26/2020 0934   TCO2 23 04/26/2020 0934   ACIDBASEDEF 3.0 (H) 04/26/2020 0934   O2SAT 59.5 04/28/2020 0436    CXR: CHEST - 2 VIEW  COMPARISON:  April 29, 2020  FINDINGS: Pacemaker present on the left. Pacemaker leads are attached to the right atrium,  right ventricle, and coronary sinus. Temporary pacemaker wires are also attached to the right heart. No evident pneumothorax. Heart is mildly enlarged with pulmonary vascularity normal. Patient is status post coronary artery bypass grafting.  There is a left pleural effusion consolidation in portions of the lingula and left lower lobe. Minimal right pleural effusion. Mild right base atelectasis. There is aortic atherosclerosis. No bone lesions.  IMPRESSION: Pacemaker leads as described. No pneumothorax. Mild cardiomegaly. Left pleural effusion with consolidation in portions of the left lower lobe and lingula. Suspect combination of atelectasis and pneumonia. Minimal right pleural effusion with right base atelectasis. Right lung otherwise clear.  Aortic Atherosclerosis (ICD10-I70.0).   Electronically Signed   By: Lowella Grip III M.D.   On: 04/30/2020 08:46  Assessment/Plan: S/P Procedure(s) (LRB): BIV PACEMAKER INSERTION CRT-P (N/A)  Overall stable POD5 s/p CABG and POD1 PPM placement for CHB Maintaining AV paced rhythm w/ stable BP Chronic combined systolic and diastolic CHF Breathing comfortably on room air Expected post op atelectasis, increased L>R w/ likely small effusions Expected post op acute blood loss anemia, mild S/P embolic stroke in remote past on long term warfarin Chronic aphasia and right-sided weakness  Elevated potassium on routine bloodwork - doubt accuracy - will repeat   Mobilize  PT/OT  Medical Rx for CHF per AHF team  Initiate search for d/c plans w/ case management  Warfarin per pharmacy  Decrease ASA to 81 mg/day  Rexene Alberts, MD 04/30/2020 9:29 AM

## 2020-04-30 NOTE — Progress Notes (Signed)
Assessed patient's right arm with ultrasound for midline. Patient's right arm contracted and painful when moving right arm.No suitable veins founds.Notified primary RN.

## 2020-05-01 DIAGNOSIS — I5023 Acute on chronic systolic (congestive) heart failure: Secondary | ICD-10-CM

## 2020-05-01 LAB — CBC
HCT: 27.8 % — ABNORMAL LOW (ref 36.0–46.0)
Hemoglobin: 9.3 g/dL — ABNORMAL LOW (ref 12.0–15.0)
MCH: 28.8 pg (ref 26.0–34.0)
MCHC: 33.5 g/dL (ref 30.0–36.0)
MCV: 86.1 fL (ref 80.0–100.0)
Platelets: 278 10*3/uL (ref 150–400)
RBC: 3.23 MIL/uL — ABNORMAL LOW (ref 3.87–5.11)
RDW: 14.3 % (ref 11.5–15.5)
WBC: 11.5 10*3/uL — ABNORMAL HIGH (ref 4.0–10.5)
nRBC: 0 % (ref 0.0–0.2)

## 2020-05-01 LAB — BASIC METABOLIC PANEL
Anion gap: 12 (ref 5–15)
BUN: 14 mg/dL (ref 8–23)
CO2: 20 mmol/L — ABNORMAL LOW (ref 22–32)
Calcium: 8.3 mg/dL — ABNORMAL LOW (ref 8.9–10.3)
Chloride: 105 mmol/L (ref 98–111)
Creatinine, Ser: 0.75 mg/dL (ref 0.44–1.00)
GFR, Estimated: >60 mL/min (ref >60)
Glucose, Bld: 103 mg/dL — ABNORMAL HIGH (ref 70–99)
Potassium: 4.1 mmol/L (ref 3.5–5.1)
Sodium: 137 mmol/L (ref 135–145)

## 2020-05-01 LAB — PROTIME-INR
INR: 1.5 — ABNORMAL HIGH (ref 0.8–1.2)
Prothrombin Time: 17.3 seconds — ABNORMAL HIGH (ref 11.4–15.2)

## 2020-05-01 LAB — GLUCOSE, CAPILLARY
Glucose-Capillary: 115 mg/dL — ABNORMAL HIGH (ref 70–99)
Glucose-Capillary: 115 mg/dL — ABNORMAL HIGH (ref 70–99)
Glucose-Capillary: 110 mg/dL — ABNORMAL HIGH (ref 70–99)
Glucose-Capillary: 132 mg/dL — ABNORMAL HIGH (ref 70–99)
Glucose-Capillary: 96 mg/dL (ref 70–99)
Glucose-Capillary: 116 mg/dL — ABNORMAL HIGH (ref 70–99)

## 2020-05-01 MED ORDER — POTASSIUM CHLORIDE 20 MEQ PO PACK
20.0000 meq | PACK | ORAL | Status: AC
  Administered 2020-05-01 (×2): 20 meq
  Filled 2020-05-01 (×3): qty 1

## 2020-05-01 MED ORDER — POTASSIUM CHLORIDE CRYS ER 20 MEQ PO TBCR
40.0000 meq | EXTENDED_RELEASE_TABLET | Freq: Once | ORAL | Status: AC
  Administered 2020-05-01: 40 meq via ORAL
  Filled 2020-05-01: qty 2

## 2020-05-01 MED ORDER — SACUBITRIL-VALSARTAN 24-26 MG PO TABS
1.0000 | ORAL_TABLET | Freq: Two times a day (BID) | ORAL | Status: DC
  Administered 2020-05-01 – 2020-05-05 (×10): 1 via ORAL
  Filled 2020-05-01 (×11): qty 1

## 2020-05-01 MED ORDER — WARFARIN SODIUM 5 MG PO TABS
5.0000 mg | ORAL_TABLET | Freq: Once | ORAL | Status: AC
  Administered 2020-05-01: 5 mg via ORAL
  Filled 2020-05-01: qty 1

## 2020-05-01 MED ORDER — SPIRONOLACTONE 12.5 MG HALF TABLET
12.5000 mg | ORAL_TABLET | Freq: Every day | ORAL | Status: DC
  Administered 2020-05-01 – 2020-05-05 (×5): 12.5 mg via ORAL
  Filled 2020-05-01 (×5): qty 1

## 2020-05-01 NOTE — Progress Notes (Signed)
Patient ID: [name], female   DOB: 05-Nov-1950, 70 y.o.   MRN: [id number]     Advanced Heart Failure Rounding Note  PCP-Cardiologist: Skeet Latch, MD  AHF: Dr. Aundra Dubin   Subjective:   - Admitted for CHB.  - Echo with EF 35-40%, inferior and lateral HK, normal RV.  - Coronary angiography: 60-70% dLM, 60-70% ostial LCx, 99% proximal calcified LCx (large/codominant LCx) - 4/4  S/p CABG x 4 (LIMA-LAD, SVG-OM2) - 4/5 Extubated - 4/8 St Jude CRT-P   Feels OK. Denies CP or SOB. Oral lasix started yesterday. Weight still up about 9 pounds from pre-op.  Potassium 6.2 yesterday but this was spurious. Recheck 3.5 -> 4.1   Objective:   Weight Range: 58.2 kg Body mass index is 20.1 kg/m.   Vital Signs:   Temp:  [97.5 F (36.4 C)-98.4 F (36.9 C)] 97.5 F (36.4 C) (04/10 0755) Pulse Rate:  [97-109] 109 (04/10 0700) Resp:  [18-26] 24 (04/10 0700) BP: (99-162)/(66-133) 119/80 (04/10 0700) SpO2:  [93 %-100 %] 99 % (04/10 0700) Weight:  [58.2 kg] 58.2 kg (04/10 0500) Last BM Date: 05/01/20  Weight change: Filed Weights   04/27/20 0500 04/28/20 0500 05/01/20 0500  Weight: 59.8 kg 60.1 kg 58.2 kg    Intake/Output:   Intake/Output Summary (Last 24 hours) at 05/01/2020 0913 Last data filed at 05/01/2020 0630 Gross per 24 hour  Intake 1142.07 ml  Output 800 ml  Net 342.07 ml      Physical Exam    General:  Sitting up. No resp difficulty HEENT: normal Neck: supple. no JVD. Carotids 2+ bilat; no bruits. No lymphadenopathy or thryomegaly appreciated. Cor: PMI nondisplaced. Regular rate & rhythm. No rubs, gallops or murmurs. Lungs: clear Abdomen: soft, nontender, nondistended. No hepatosplenomegaly. No bruits or masses. Good bowel sounds. Extremities: no cyanosis, clubbing, rash, edema Neuro: alert & follows commands. R hemiparesis and expressive aphasia    Telemetry   Sinus with BiV pacing 100-110  (personally reviewed)  Labs    CBC Recent Labs     04/30/20 0519 05/01/20 0715  WBC 10.3 11.5*  HGB 9.3* 9.3*  HCT 27.8* 27.8*  MCV 87.1 86.1  PLT 237 627   Basic Metabolic Panel Recent Labs    04/30/20 1915 05/01/20 0715  NA 136 137  K 3.5 4.1  CL 103 105  CO2 24 20*  GLUCOSE 135* 103*  BUN 14 14  CREATININE 0.88 0.75  CALCIUM 8.3* 8.3*   Liver Function Tests No results for input(s): AST, ALT, ALKPHOS, BILITOT, PROT, ALBUMIN in the last 72 hours. No results for input(s): LIPASE, AMYLASE in the last 72 hours. Cardiac Enzymes No results for input(s): CKTOTAL, CKMB, CKMBINDEX, TROPONINI in the last 72 hours.  BNP: BNP (last 3 results) No results for input(s): BNP in the last 8760 hours.  ProBNP (last 3 results) No results for input(s): PROBNP in the last 8760 hours.   D-Dimer No results for input(s): DDIMER in the last 72 hours. Hemoglobin A1C No results for input(s): HGBA1C in the last 72 hours. Fasting Lipid Panel No results for input(s): CHOL, HDL, LDLCALC, TRIG, CHOLHDL, LDLDIRECT in the last 72 hours. Thyroid Function Tests No results for input(s): TSH, T4TOTAL, T3FREE, THYROIDAB in the last 72 hours.  Invalid input(s): FREET3  Other results:   Imaging    No results found.   Medications:     Scheduled Medications: . sodium chloride   Intravenous Once  . aspirin EC  81 mg Oral Daily  .  atorvastatin  80 mg Oral QHS  . baclofen  20 mg Oral BID  . bisacodyl  10 mg Oral Daily   Or  . bisacodyl  10 mg Rectal Daily  . Chlorhexidine Gluconate Cloth  6 each Topical Daily  . docusate sodium  200 mg Oral Daily  . enoxaparin (LOVENOX) injection  30 mg Subcutaneous Q24H  . furosemide  40 mg Oral Daily  . insulin aspart  0-9 Units Subcutaneous TID WC  . lacosamide  100 mg Oral BID  . levETIRAcetam  1,500 mg Oral BID  . mouth rinse  15 mL Mouth Rinse BID  . pantoprazole  40 mg Oral Daily  . potassium chloride  20 mEq Per Tube Q4H  . sodium chloride flush  3 mL Intravenous Q12H  . Warfarin -  Pharmacist Dosing Inpatient   Does not apply q1600    Infusions: . sodium chloride    . sodium chloride    . sodium chloride    . sodium chloride 50 mL/hr at 05/01/20 0600  . lactated ringers      PRN Medications: sodium chloride, acetaminophen, ondansetron (ZOFRAN) IV, oxyCODONE, polyethylene glycol, sodium chloride flush, traMADol   Assessment/Plan   1. Complete heart block: Patient had baseline NSR with LAFB/RBBB, so there was underlying conduction system disease.  Suspect this progressed to CHB with beta blocker/digoxin use + chronic ischemia in territory of large  co-dominant LCx.  CHB recurred after CABG, now s/p St Jude CRT-P.  - Can eventually resume HF beta blocker.  2. Chronic systolic CHF: Most recent prior echo in 8/20 with EF 35-40% and wall motion abnormalities.  Ischemic cardiomyopathy with left main/severe codominant LCx disease.  Echo this admission with stable EF 35-40%, inferior and lateral hypokinesis.  Repeat echo after CABG fairly stable EF 30-35%.  Not significantly volume overloaded on exam. Per her husband, at baseline she is aphasic but can walk around the house and do her ADLs. Now s/p CABG 4/4. Off inotropes/pressors.  - Start Lasix 40 mg po daily. Wil follow. May need to increase as weight still up about 9 pounds.     - Continue titration of GDMT - Restart spiro 12.5 (k was spuriously elevated on labs)   - Restart Entresto 24/26 bid 3. 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  Has been on warfarin.  Has had long-standing aphasia and right hemiparesis.   - Warfarin resumed by surgery. INR 1.5 today 4. H/o seizure disorder: Continue Keppra. Quiescent  5. ?Sickle cell anemia: Carries this diagnosis but not sure truly present.  6. CAD:  LHC showed 60-70% distal left main stenosis, heavily calcified proximal LCx with 60-70% stenosis at the ostium and discrete 99% stenosis in the proximal LCx.  I suspect that her CHB is related to chronic ischemia from LM-LCx  disease worsened by nodal blockade.  Difficult situation.  She has surgical disease but will be more difficult to rehab after CABG with weakness from prior stroke though per her husband, she does all ADLs and walks with cane at home.  PCI would be possible but high risk/difficult heavy calcification and tortuousity. Had CABG x 2 4/4 w/ LIMA-LAD, SVG-OM2  - Continue ASA 81, statin.  7. Continue aggressive PT work, out of bed.  Will need SNF.   Can go to [facility] from my standpoint.   Length of Stay: [place], MD  05/01/2020, 9:13 AM  Advanced Heart Failure Team Pager [phone] (M-F; 7a - 5p)  Please  contact [facility] for night-coverage after hours (5p -7a ) and weekends on [email or web address]

## 2020-05-01 NOTE — Progress Notes (Signed)
Patient transferred to [name]. Epicardial pacing wires removed by this RN in room 2C03. Upon removal one of the left ventricular leads was not intact. Vital signs remain stable after removal. No further complications at this time. Reported to [facility] RN, Primary nurse on receiving unit. Roxy Manns, MD notified. No further orders at this time. Patient will be monitored closely by primary RN.  Luberta Mutter RN

## 2020-05-01 NOTE — Progress Notes (Signed)
[place] for Warfarin  Indication: stroke  Allergies  Allergen Reactions  . Lexiscan [Regadenoson] Other (See Comments)    Perioral tingling and throat tightness    Patient Measurements: Height: 5\' 7"  (170.2 cm) Weight: 58.2 kg (128 lb 4.9 oz) IBW/kg (Calculated) : 61.6 Heparin Dosing Weight: 57 kg  Vital Signs: Temp: 97.5 F (36.4 C) (04/10 0755) Temp Source: Oral (04/10 0755) BP: 119/80 (04/10 0700) Pulse Rate: 109 (04/10 0700)  Labs: Recent Labs    04/29/20 0217 04/30/20 0300 04/30/20 0519 04/30/20 1915 05/01/20 0715  HGB 10.5*  --  9.3*  --  9.3*  HCT 31.5*  --  27.8*  --  27.8*  PLT 178  --  237  --  278  LABPROT 15.0 16.0*  --   --  17.3*  INR 1.2 1.3*  --   --  1.5*  CREATININE 0.92  --  0.82 0.88 0.75    Estimated Creatinine Clearance: 59.3 mL/min (by C-G formula based on SCr of 0.75 mg/dL).   Medical History: Past Medical History:  Diagnosis Date  . Adenomatous polyp of colon 09/2012   repeat colonoscopy in 5 years by Dr. Sharlett Iles  . CHF (congestive heart failure) (HCC)    EF 15-20% as of 05/28/11 (Dr Legrand Como [facility]-Hospital D/C summary)  . CVA (cerebral infarction) 12/2007  . Hemiparesis ([place])   . Hyperlipidemia   . Hypertension   . Seizures ([place])   . Sickle cell anemia (HCC)   . Stroke ([place])   . Vitamin D deficiency 2010    Assessment: 70 yo female presents with CHB now s/p temporary pacer. On warfarin PTA for hx embolic stroke, no known hx of afib. INR on admit therapeutic at 2.2. LHC on 4/1 shows LM-LCx disease, PCI held for CABG consult. Pharmacy asked start heparin 8 hr after sheath pull and when INR <2.   PTA warfarin regimen: 5 mg daily except 7.5 mg Mon/Thurs INR on admit: 2.2  INR remains low today as expected, but increasing. No overt bleeding or complications noted.  CBC stable.  Goal of Therapy: Heparin level 0.3-0.7  INR 2-3 Monitor platelets by anticoagulation protocol: Yes   Plan:   Warfarin 5 mg x 1 tonight. Daily INR. Enoxaparin 30 mg daily until INR > 2 per EP.  [place] Crane, Roylene Reason, BCCP Clinical Pharmacist  05/01/2020 10:08 AM   [facility] pharmacy phone numbers are listed on [email or web address]

## 2020-05-01 NOTE — Progress Notes (Signed)
      [address]       [place] [address]             [phone]        CARDIOTHORACIC SURGERY PROGRESS NOTE   R2 Days Post-Op Procedure(s) (LRB): BIV PACEMAKER INSERTION CRT-P (N/A)  Subjective: No complaints  Objective: Vital signs: BP Readings from Last 1 Encounters:  05/01/20 119/80   Pulse Readings from Last 1 Encounters:  05/01/20 (!) 109   Resp Readings from Last 1 Encounters:  05/01/20 (!) 24   Temp Readings from Last 1 Encounters:  05/01/20 (!) 97.5 F (36.4 C) (Oral)    Hemodynamics:    Physical Exam:  Rhythm:   AV paced  Breath sounds: clear  Heart sounds:  RRR  Incisions:  Clean and dry  Abdomen:  Soft, non-distended, non-tender  Extremities:  Warm, well-perfused    Intake/Output from previous day: 04/09 0701 - 04/10 0700 In: 1142.1 [P.O.:120; I.V.:1022.1] Out: 800 [Urine:800] Intake/Output this shift: No intake/output data recorded.  Lab Results:  CBC: Recent Labs    04/30/20 0519 05/01/20 0715  WBC 10.3 11.5*  HGB 9.3* 9.3*  HCT 27.8* 27.8*  PLT 237 278    BMET:  Recent Labs    04/30/20 1915 05/01/20 0715  NA 136 137  K 3.5 4.1  CL 103 105  CO2 24 20*  GLUCOSE 135* 103*  BUN 14 14  CREATININE 0.88 0.75  CALCIUM 8.3* 8.3*     PT/INR:   Recent Labs    05/01/20 0715  LABPROT 17.3*  INR 1.5*    CBG (last 3)  Recent Labs    04/30/20 2151 05/01/20 0623 05/01/20 0736  GLUCAP 115* 115* 110*    ABG    Component Value Date/Time   PHART 7.378 04/26/2020 0934   PCO2ART 37.4 04/26/2020 0934   PO2ART 124 (H) 04/26/2020 0934   HCO3 21.9 04/26/2020 0934   TCO2 23 04/26/2020 0934   ACIDBASEDEF 3.0 (H) 04/26/2020 0934   O2SAT 59.5 04/28/2020 0436    CXR: n/a  Assessment/Plan: S/P Procedure(s) (LRB): BIV PACEMAKER INSERTION CRT-P (N/A)   Overall stable POD6 s/p CABG and POD2 PPM placement for CHB Maintaining AV paced rhythm w/ stable BP Chronic combined systolic and diastolic  CHF Breathing comfortably on room air Expected post op atelectasis, increased L>R w/ likely small effusions Expected post op acute blood loss anemia, mild S/P embolic stroke in remote past on long term warfarin Chronic aphasia and right-sided weakness   Mobilize  PT/OT  Medical Rx for CHF per AHF team  Initiate search for d/c plans w/ case management  Warfarin per pharmacy  Transfer 2C PCU   Rexene Alberts, MD 05/01/2020 9:21 AM

## 2020-05-01 NOTE — Plan of Care (Signed)

## 2020-05-02 ENCOUNTER — Other Ambulatory Visit (HOSPITAL_COMMUNITY): Payer: Self-pay

## 2020-05-02 ENCOUNTER — Encounter (HOSPITAL_COMMUNITY): Payer: Self-pay | Admitting: Cardiology

## 2020-05-02 DIAGNOSIS — I5022 Chronic systolic (congestive) heart failure: Secondary | ICD-10-CM | POA: Diagnosis not present

## 2020-05-02 LAB — GLUCOSE, CAPILLARY
Glucose-Capillary: 109 mg/dL — ABNORMAL HIGH (ref 70–99)
Glucose-Capillary: 105 mg/dL — ABNORMAL HIGH (ref 70–99)
Glucose-Capillary: 148 mg/dL — ABNORMAL HIGH (ref 70–99)
Glucose-Capillary: 115 mg/dL — ABNORMAL HIGH (ref 70–99)

## 2020-05-02 LAB — CBC
HCT: 30 % — ABNORMAL LOW (ref 36.0–46.0)
Hemoglobin: 9.8 g/dL — ABNORMAL LOW (ref 12.0–15.0)
MCH: 28.8 pg (ref 26.0–34.0)
MCHC: 32.7 g/dL (ref 30.0–36.0)
MCV: 88.2 fL (ref 80.0–100.0)
Platelets: 337 10*3/uL (ref 150–400)
RBC: 3.4 MIL/uL — ABNORMAL LOW (ref 3.87–5.11)
RDW: 14.6 % (ref 11.5–15.5)
WBC: 14.2 10*3/uL — ABNORMAL HIGH (ref 4.0–10.5)
nRBC: 0 % (ref 0.0–0.2)

## 2020-05-02 LAB — BASIC METABOLIC PANEL
Anion gap: 9 (ref 5–15)
BUN: 14 mg/dL (ref 8–23)
CO2: 25 mmol/L (ref 22–32)
Calcium: 8.6 mg/dL — ABNORMAL LOW (ref 8.9–10.3)
Chloride: 104 mmol/L (ref 98–111)
Creatinine, Ser: 1 mg/dL (ref 0.44–1.00)
GFR, Estimated: >60 mL/min (ref >60)
Glucose, Bld: 111 mg/dL — ABNORMAL HIGH (ref 70–99)
Potassium: 4 mmol/L (ref 3.5–5.1)
Sodium: 138 mmol/L (ref 135–145)

## 2020-05-02 LAB — PROTIME-INR
INR: 1.6 — ABNORMAL HIGH (ref 0.8–1.2)
Prothrombin Time: 18 seconds — ABNORMAL HIGH (ref 11.4–15.2)

## 2020-05-02 MED ORDER — WARFARIN SODIUM 2.5 MG PO TABS
2.5000 mg | ORAL_TABLET | Freq: Once | ORAL | Status: AC
  Administered 2020-05-02: 2.5 mg via ORAL
  Filled 2020-05-02: qty 1

## 2020-05-02 MED ORDER — GERHARDT'S BUTT CREAM
TOPICAL_CREAM | Freq: Two times a day (BID) | CUTANEOUS | Status: DC
  Administered 2020-05-03 – 2020-05-04 (×2): 1 via TOPICAL
  Filled 2020-05-02: qty 1

## 2020-05-02 NOTE — Progress Notes (Signed)
[facility]  Indication: stroke  Allergies  Allergen Reactions  . Lexiscan [Regadenoson] Other (See Comments)    Perioral tingling and throat tightness    Patient Measurements: Height: 5\' 7"  (170.2 cm) Weight: 60.6 kg (133 lb 9.6 oz) IBW/kg (Calculated) : 61.6 Heparin Dosing Weight: 57 kg  Vital Signs: Temp: 98.7 F (37.1 C) (04/11 0720) Temp Source: Oral (04/11 0720) BP: 90/62 (04/11 0323) Pulse Rate: 107 (04/11 0720)  Labs: Recent Labs    04/30/20 0300 04/30/20 0519 04/30/20 0519 04/30/20 1915 05/01/20 0715 05/02/20 0040  HGB  --  9.3*   < >  --  9.3* 9.8*  HCT  --  27.8*  --   --  27.8* 30.0*  PLT  --  237  --   --  278 337  LABPROT 16.0*  --   --   --  17.3* 18.0*  INR 1.3*  --   --   --  1.5* 1.6*  CREATININE  --  0.82   < > 0.88 0.75 1.00   < > = values in this interval not displayed.    Estimated Creatinine Clearance: 49.4 mL/min (by C-G formula based on SCr of 1 mg/dL).   Medical History: Past Medical History:  Diagnosis Date  . Adenomatous polyp of colon 09/2012   repeat colonoscopy in 5 years by Dr. Sharlett Iles  . CHF (congestive heart failure) (HCC)    EF 15-20% as of 05/28/11 (Dr Legrand [facility] D/[facility] summary)  . CVA (cerebral infarction) 12/2007  . Hemiparesis ([place])   . Hyperlipidemia   . Hypertension   . Seizures ([place])   . Sickle cell anemia (HCC)   . Stroke ([facility] [place])   . Vitamin D deficiency 2010    Assessment: 70 yo female presents with CHB now s/p temporary pacer. On warfarin PTA for hx embolic stroke, no known hx of afib. INR on admit therapeutic at 2.2. LHC on 4/1 shows LM-LCx disease, PCI held for CABG consult. Pharmacy asked start heparin 8 hr after sheath pull and when INR <2.   PTA warfarin regimen: 5 mg daily except 7.5 mg Mon/Thurs INR on admit: 2.2  INR 1.6 < goal but increasing.  Will titrate slowly given CABG and PPM No overt bleeding or complications noted.  CBC  stable.  Goal of Therapy:  INR 2-3 Monitor platelets by anticoagulation protocol: Yes   Plan:  Warfarin 2.5 mg x 1 tonight. Daily INR. Enoxaparin 30 mg daily until INR > 2 per EP.    Bonnita Nasuti Pharm.D. CPP, BCPS Clinical Pharmacist [phone] 05/02/2020 10:01 AM     [facility] pharmacy phone numbers are listed on [email or web address]

## 2020-05-02 NOTE — Progress Notes (Signed)
Physical Therapy Treatment Patient Details Name: [name] MRN: [id number] DOB: 1950-05-01 Today's Date: 05/02/2020    History of Present Illness 70 y.o. female admitted 3/31 after syncope at home and found to have complete heart block. Pt s/p CABG x 2 4/4. PPM placed 04/29/2020. PMhx: HTN, HLD, stroke (expressive aphasia and R sided deficit), sickle cell anemia, seizure d/o, chronic HF (systolic).    PT Comments    Pt continues to require significant physical assistance to transfer at this time. Due to sternal and pacemaker precautions pt is unable to push/pull through LUE. Pt depends on LUE for mobility at baseline due to R hemiplegia from prior CVA. Pt aslo demonstrates posterior lean and is unable to flex forward at hips during this session to facilitate transfers. PT continues to recommend SNF placement at this time. If patient and family prefer discharge home pt may require use of mechanical lift.  Follow Up Recommendations  SNF;Supervision/Assistance - 24 hour     Equipment Recommendations  3in1 (PT);Hospital bed (hoyer lift)    Recommendations for Other Services       Precautions / Restrictions Precautions Precautions: Sternal;Fall;ICD/Pacemaker Precaution Booklet Issued: No Precaution Comments: aphasia, right hemiparesis with contracted RUE, foot drop and extension of RLE Required Braces or Orthoses: Other Brace Other Brace: R wrist cock up splint Restrictions Weight Bearing Restrictions: Yes Other Position/Activity Restrictions: sternal and pacemaker precautions    Mobility  Bed Mobility Overal bed mobility: Needs Assistance Bed Mobility: Supine to Sit     Supine to sit: Mod assist     General bed mobility comments: pt attempts to use LUE to lift RLE, PT provides cues to avoid lifting with LUE 2/2 pacemaker and sternal precautions. Pt requires assistance for RLE, to elevate trunk and to pivot hips    Transfers Overall transfer level: Needs  assistance Equipment used: 1 person hand held assist Transfers: Sit to/from Stand;Stand Pivot Transfers Sit to Stand: Max assist Stand pivot transfers: Max assist       General transfer comment: posterior lean, R knee block  Ambulation/Gait                 Stairs             Wheelchair Mobility    Modified Rankin (Stroke Patients Only)       Balance Overall balance assessment: Needs assistance Sitting-balance support: No upper extremity supported;Feet supported Sitting balance-Leahy Scale: Poor Sitting balance - Comments: posterior lean, minA   Standing balance support: Single extremity supported Standing balance-Leahy Scale: Poor Standing balance comment: modA, posterior lean                            Cognition Arousal/Alertness: Awake/alert Behavior During Therapy: WFL for tasks assessed/performed Overall Cognitive Status: Difficult to assess                                 General Comments: uses gestures, head nod and facial expression to communicate needs      Exercises      General Comments General comments (skin integrity, edema, etc.): VSS on RA      Pertinent Vitals/Pain Pain Assessment: No/denies pain Faces Pain Scale: Hurts little more Pain Location: pt points to RLE and RUE when asked about pain, nods yes when asked if these areas hurt Pain Intervention(s): Monitored during session    Home Living  Prior Function            PT Goals (current goals can now be found in the care plan section) Acute Rehab PT Goals Patient Stated Goal: to eat her lunch Progress towards PT goals: Progressing toward goals (very slowly)    Frequency    Min 2X/week      PT Plan Current plan remains appropriate    Co-evaluation              AM-PAC PT "6 Clicks" Mobility   Outcome Measure  Help needed turning from your back to your side while in a flat bed without using bedrails?:  A Lot Help needed moving from lying on your back to sitting on the side of a flat bed without using bedrails?: A Lot Help needed moving to and from a bed to a chair (including a wheelchair)?: A Lot Help needed standing up from a chair using your arms (e.g., wheelchair or bedside chair)?: A Lot Help needed to walk in hospital room?: Total Help needed climbing 3-5 steps with a railing? : Total 6 Click Score: 10    End of Session Equipment Utilized During Treatment: Gait belt Activity Tolerance: Patient tolerated treatment well (self-limiting) Patient left: in chair;with call bell/phone within reach;with chair alarm set Nurse Communication: Mobility status PT Visit Diagnosis: Other abnormalities of gait and mobility (R26.89);Difficulty in walking, not elsewhere classified (R26.2);Hemiplegia and hemiparesis Hemiplegia - Right/Left: Right Hemiplegia - caused by: Cerebral infarction     Time: 1201-1218 PT Time Calculation (min) (ACUTE ONLY): 17 min  Charges:  $Therapeutic Activity: 8-22 mins                     Zenaida Niece, PT, DPT Acute Rehabilitation Pager: [phone]    Zenaida Niece 05/02/2020, 1:24 PM

## 2020-05-02 NOTE — Plan of Care (Signed)

## 2020-05-02 NOTE — Progress Notes (Signed)
      [name]       [place] [address]             [phone]      3 Days Post-Op Procedure(s) (LRB): BIV PACEMAKER INSERTION CRT-P (N/A) Subjective: Feels okay this morning. She looks like she might be having trouble feeding herself and may need some assistance.   Objective: Vital signs in last 24 hours: Temp:  [97.7 F (36.5 C)-99 F (37.2 C)] 98.7 F (37.1 C) (04/11 0720) Pulse Rate:  [107-116] 107 (04/11 0720) Cardiac Rhythm: Ventricular paced (04/11 0800) Resp:  [20-29] 22 (04/11 0720) BP: (84-132)/(56-92) 90/62 (04/11 0323) SpO2:  [97 %-100 %] 98 % (04/11 0720) Weight:  [60.6 kg] 60.6 kg (04/11 0300)     Intake/Output from previous day: 04/10 0701 - 04/11 0700 In: 323 [P.O.:320; I.V.:3] Out: 1000 [Urine:1000] Intake/Output this shift: No intake/output data recorded.  General appearance: alert, cooperative and no distress Heart: paced rhythm Lungs: clear to auscultation bilaterally Abdomen: soft, non-tender; bowel sounds normal; no masses,  no organomegaly Extremities: extremities normal, atraumatic, no cyanosis or edema Wound: clean and dry  Lab Results: Recent Labs    05/01/20 0715 05/02/20 0040  WBC 11.5* 14.2*  HGB 9.3* 9.8*  HCT 27.8* 30.0*  PLT 278 337   BMET:  Recent Labs    05/01/20 0715 05/02/20 0040  NA 137 138  K 4.1 4.0  CL 105 104  CO2 20* 25  GLUCOSE 103* 111*  BUN 14 14  CREATININE 0.75 1.00  CALCIUM 8.3* 8.6*    PT/INR:  Recent Labs    05/02/20 0040  LABPROT 18.0*  INR 1.6*   ABG    Component Value Date/Time   PHART 7.378 04/26/2020 0934   HCO3 21.9 04/26/2020 0934   TCO2 23 04/26/2020 0934   ACIDBASEDEF 3.0 (H) 04/26/2020 0934   O2SAT 59.5 04/28/2020 0436   CBG (last 3)  Recent Labs    05/01/20 1604 05/01/20 2103 05/02/20 0541  GLUCAP 96 116* 109*    Assessment/Plan: S/P Procedure(s) (LRB): BIV PACEMAKER INSERTION CRT-P (N/A)  1. CV-PPM placed, stable AV paced rhythm,  hypotensive at times, HF following and titration of  medications 2. Pulm-tolerating room air with good oxygen saturation 3. Renal- creatinine 1.00, electrolytes okay 4. H and H 9.8/30.0, stable  5. Endo-blood glucose well controlled 6. Embolic CVA (remote hx 1117)-BVAPOLID has been restarted. INR 1.6  Plan: She will need a nursing facility at discharge. Progressing well, she is a little hypotensive this morning, may need to hold her Delene Loll but will leave this up to HF.    LOS: 11 days    Elgie Collard 05/02/2020

## 2020-05-02 NOTE — Progress Notes (Addendum)
Patient ID: [name], female   DOB: 1950-12-23, 69 y.o.   MRN: [id number]     Advanced Heart Failure Rounding Note  PCP-Cardiologist: Skeet Latch, MD  AHF: Dr. Aundra Dubin   Subjective:   - Admitted for CHB.  - Echo with EF 35-40%, inferior and lateral HK, normal RV.  - Coronary angiography: 60-70% dLM, 60-70% ostial LCx, 99% proximal calcified LCx (large/codominant LCx) - 4/4  S/p CABG x 4 (LIMA-LAD, SVG-OM2) - 4/5 Extubated - 4/8 St Jude CRT-P   Sitting up in bed. No distress. BPs soft, 35K-09F systolic. Wt still up from pre-op wt but ? If accurate.   Scr trending up, 0.75>>1.00    Objective:   Weight Range: 60.6 kg Body mass index is 20.92 kg/m.   Vital Signs:   Temp:  [97.7 F (36.5 C)-99 F (37.2 C)] 98.7 F (37.1 C) (04/11 0720) Pulse Rate:  [107-116] 107 (04/11 0720) Resp:  [20-29] 22 (04/11 0720) BP: (84-132)/(56-92) 90/62 (04/11 0323) SpO2:  [97 %-100 %] 98 % (04/11 0720) Weight:  [60.6 kg] 60.6 kg (04/11 0300) Last BM Date: 05/01/20  Weight change: Filed Weights   04/28/20 0500 05/01/20 0500 05/02/20 0300  Weight: 60.1 kg 58.2 kg 60.6 kg    Intake/Output:   Intake/Output Summary (Last 24 hours) at 05/02/2020 0810 Last data filed at 05/01/2020 2125 Gross per 24 hour  Intake 323 ml  Output 1000 ml  Net -677 ml      Physical Exam    General:  Frail/ fatigue appearing female Sitting up in bed No resp difficulty HEENT: dentition in poor repair, otherwise normal Neck: supple. no JVD. Carotids 2+ bilat; no bruits. No lymphadenopathy or thryomegaly appreciated. Cor: PMI nondisplaced. Regular rate & rhythm. No rubs, gallops or murmurs. Lungs: clear Abdomen: soft, nontender, nondistended. No hepatosplenomegaly. No bruits or masses. Good bowel sounds. Extremities: no cyanosis, clubbing, rash, edema Neuro: alert & follows commands. R hemiparesis and expressive aphasia    Telemetry   Sinus with BiV pacing low 100s  (personally  reviewed)  Labs    CBC Recent Labs    05/01/20 0715 05/02/20 0040  WBC 11.5* 14.2*  HGB 9.3* 9.8*  HCT 27.8* 30.0*  MCV 86.1 88.2  PLT 278 818   Basic Metabolic Panel Recent Labs    05/01/20 0715 05/02/20 0040  NA 137 138  K 4.1 4.0  CL 105 104  CO2 20* 25  GLUCOSE 103* 111*  BUN 14 14  CREATININE 0.75 1.00  CALCIUM 8.3* 8.6*   Liver Function Tests No results for input(s): AST, ALT, ALKPHOS, BILITOT, PROT, ALBUMIN in the last 72 hours. No results for input(s): LIPASE, AMYLASE in the last 72 hours. Cardiac Enzymes No results for input(s): CKTOTAL, CKMB, CKMBINDEX, TROPONINI in the last 72 hours.  BNP: BNP (last 3 results) No results for input(s): BNP in the last 8760 hours.  ProBNP (last 3 results) No results for input(s): PROBNP in the last 8760 hours.   D-Dimer No results for input(s): DDIMER in the last 72 hours. Hemoglobin A1C No results for input(s): HGBA1C in the last 72 hours. Fasting Lipid Panel No results for input(s): CHOL, HDL, LDLCALC, TRIG, CHOLHDL, LDLDIRECT in the last 72 hours. Thyroid Function Tests No results for input(s): TSH, T4TOTAL, T3FREE, THYROIDAB in the last 72 hours.  Invalid input(s): FREET3  Other results:   Imaging    No results found.   Medications:     Scheduled Medications: . sodium chloride   Intravenous Once  .  aspirin EC  81 mg Oral Daily  . atorvastatin  80 mg Oral QHS  . baclofen  20 mg Oral BID  . bisacodyl  10 mg Oral Daily   Or  . bisacodyl  10 mg Rectal Daily  . Chlorhexidine Gluconate Cloth  6 each Topical Daily  . docusate sodium  200 mg Oral Daily  . enoxaparin (LOVENOX) injection  30 mg Subcutaneous Q24H  . furosemide  40 mg Oral Daily  . insulin aspart  0-9 Units Subcutaneous TID WC  . lacosamide  100 mg Oral BID  . levETIRAcetam  1,500 mg Oral BID  . mouth rinse  15 mL Mouth Rinse BID  . pantoprazole  40 mg Oral Daily  . sacubitril-valsartan  1 tablet Oral BID  . sodium chloride flush  3  mL Intravenous Q12H  . spironolactone  12.5 mg Oral Daily  . Warfarin - Pharmacist Dosing Inpatient   Does not apply q1600    Infusions: . sodium chloride    . sodium chloride    . sodium chloride    . sodium chloride 50 mL/hr at 05/01/20 0600  . lactated ringers      PRN Medications: sodium chloride, acetaminophen, ondansetron (ZOFRAN) IV, oxyCODONE, polyethylene glycol, sodium chloride flush, traMADol   Assessment/Plan   1. Complete heart block: Patient had baseline NSR with LAFB/RBBB, so there was underlying conduction system disease.  Suspect this progressed to CHB with beta blocker/digoxin use + chronic ischemia in territory of large  co-dominant LCx.  CHB recurred after CABG, now s/p St Jude CRT-P.  - Can eventually resume HF beta blocker.  2. Chronic systolic CHF: Most recent prior echo in 8/20 with EF 35-40% and wall motion abnormalities.  Ischemic cardiomyopathy with left main/severe codominant LCx disease.  Echo this admission with stable EF 35-40%, inferior and lateral hypokinesis.  Repeat echo after CABG fairly stable EF 30-35%.  Not significantly volume overloaded on exam. Per her husband, at baseline she is aphasic but can walk around the house and do her ADLs. Now s/p CABG 4/4. Off inotropes/pressors.  - on PO Lasix 40 daily. Wt still up from pre-op wt but ? If accurate. Creatinine 1. May need ReDs clip assessment - Continue titration of GDMT as BP allows  - Continue spiro 12.5  - Continue Entresto 24/26 bid, monitor BP  - Plan SGLT2i soon  3. 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  Has been on warfarin.  Has had long-standing aphasia and right hemiparesis.   - Warfarin resumed by surgery. INR 1.6 today 4. H/o seizure disorder: Continue Keppra. Quiescent  5. ?Sickle cell anemia: Carries this diagnosis but not sure truly present.  6. CAD:  LHC showed 60-70% distal left main stenosis, heavily calcified proximal LCx with 60-70% stenosis at the ostium and discrete 99%  stenosis in the proximal LCx.  I suspect that her CHB is related to chronic ischemia from LM-LCx disease worsened by nodal blockade.  Difficult situation.  She has surgical disease but will be more difficult to rehab after CABG with weakness from prior stroke though per her husband, she does all ADLs and walks with cane at home.  PCI would be possible but high risk/difficult heavy calcification and tortuousity. Had CABG x 2 4/4 w/ LIMA-LAD, SVG-OM2  - Continue ASA 81, statin.  7. Continue aggressive PT work, out of bed.  Will need SNF.   Length of Stay: [address] PA-C  05/02/2020, 8:10 AM  Advanced Heart Failure  Team Pager [phone] (M-F; 7a - 5p)  Please contact [facility] for night-coverage after hours (5p -7a ) and weekends on [email or web address]  Patient seen with PA, agree with the above note.   SBP 90s but denies lightheadedness.  No complaints.   General: NAD Neck: No JVD, no thyromegaly or thyroid nodule.  Lungs: Clear to auscultation bilaterally with normal respiratory effort. CV: Nondisplaced PMI.  Heart regular S1/S2, no S3/S4, no murmur.  No peripheral edema.   Abdomen: Soft, nontender, no hepatosplenomegaly, no distention.  Skin: Intact without lesions or rashes.  Neurologic: Alert and oriented x 3.  Psych: Normal affect. Extremities: No clubbing or cyanosis.  HEENT: Normal.   Weight still up from baseline but volume ok on exam, think would just continue po Lasix.  SBP 90s but stable and has been getting her meds.  Would not change today, just follow for now.   She should be ready for SNF soon.   Loralie Champagne 05/02/2020 8:36 AM

## 2020-05-02 NOTE — Progress Notes (Signed)
Occupational Therapy Treatment Patient Details Name: [name] [place] MRN: [id number] DOB: 25-Apr-1950 Today's Date: 05/02/2020    History of present illness 70 y.o. female admitted 3/31 after syncope at home and found to have complete heart block. Pt s/p CABG x 2 4/4. PPM placed 04/29/2020. PMhx: HTN, HLD, stroke (expressive aphasia and R sided deficit), sickle cell anemia, seizure d/o, chronic HF (systolic).   OT comments  Pt seen after up to chair with PT. Completed grooming in sitting and self fed with min assist to set up tray, open containers. No difficulty self feeding when seated upright.   Follow Up Recommendations  SNF    Equipment Recommendations   (defer to next venue)    Recommendations for Other Services      Precautions / Restrictions Precautions Precautions: Sternal;Fall;ICD/Pacemaker Precaution Comments: aphasia, right hemiparesis with contracted RUE, foot drop and extension of RLE Required Braces or Orthoses: Other Brace Other Brace: R wrist cock up splint       Mobility Bed Mobility                    Transfers                      Balance                                           ADL either performed or assessed with clinical judgement   ADL Overall ADL's : Needs assistance/impaired Eating/Feeding: Minimal assistance;Sitting Eating/Feeding Details (indicate cue type and reason): pt able to self feed once tray set up, containers opened Grooming: Wash/dry hands;Sitting;Minimal assistance                                       Vision       Perception     Praxis      Cognition Arousal/Alertness: Awake/alert Behavior During Therapy: WFL for tasks assessed/performed Overall Cognitive Status: Difficult to assess                                 General Comments: uses gestures, head nod and facial expression to communicate needs        Exercises     Shoulder Instructions        General Comments      Pertinent Vitals/ Pain       Pain Assessment: No/denies pain  Home Living                                          Prior Functioning/Environment              Frequency  Min 2X/week        Progress Toward Goals  OT Goals(current goals can now be found in the care plan section)  Progress towards OT goals: Progressing toward goals  Acute Rehab OT Goals Patient Stated Goal: to eat her lunch OT Goal Formulation: With patient Time For Goal Achievement: 05/11/20 Potential to Achieve Goals: [place] Discharge plan remains appropriate    Co-evaluation  AM-PAC OT "6 Clicks" Daily Activity     Outcome Measure   Help from another person eating meals?: A Little Help from another person taking care of personal grooming?: A Little Help from another person toileting, which includes using toliet, bedpan, or urinal?: A Lot Help from another person bathing (including washing, rinsing, drying)?: A Lot Help from another person to put on and taking off regular upper body clothing?: A Lot Help from another person to put on and taking off regular lower body clothing?: A Lot 6 Click Score: 14    End of Session    OT Visit Diagnosis: Unsteadiness on feet (R26.81);Muscle weakness (generalized) (M62.81);Other abnormalities of gait and mobility (R26.89);Hemiplegia and hemiparesis Hemiplegia - Right/Left: Right Hemiplegia - dominant/non-dominant: Dominant Hemiplegia - caused by: Cerebral infarction   Activity Tolerance Patient tolerated treatment well   Patient Left in chair;with call bell/phone within reach;with chair alarm set   Nurse Communication          Time: 7591-6384 OT Time Calculation (min): 15 min  Charges: OT General Charges $OT Visit: 1 Visit OT Treatments $Self Care/Home Management : 8-22 mins  Nestor Lewandowsky, OTR/L Acute Rehabilitation Services Pager: [phone] Office:  [phone]   Malka So 05/02/2020, 1:21 PM

## 2020-05-03 DIAGNOSIS — I5022 Chronic systolic (congestive) heart failure: Secondary | ICD-10-CM | POA: Diagnosis not present

## 2020-05-03 LAB — BASIC METABOLIC PANEL
Anion gap: 12 (ref 5–15)
BUN: 16 mg/dL (ref 8–23)
CO2: 24 mmol/L (ref 22–32)
Calcium: 8.6 mg/dL — ABNORMAL LOW (ref 8.9–10.3)
Chloride: 101 mmol/L (ref 98–111)
Creatinine, Ser: 0.94 mg/dL (ref 0.44–1.00)
GFR, Estimated: >60 mL/min (ref >60)
Glucose, Bld: 115 mg/dL — ABNORMAL HIGH (ref 70–99)
Potassium: 3.5 mmol/L (ref 3.5–5.1)
Sodium: 137 mmol/L (ref 135–145)

## 2020-05-03 LAB — GLUCOSE, CAPILLARY
Glucose-Capillary: 113 mg/dL — ABNORMAL HIGH (ref 70–99)
Glucose-Capillary: 110 mg/dL — ABNORMAL HIGH (ref 70–99)
Glucose-Capillary: 101 mg/dL — ABNORMAL HIGH (ref 70–99)
Glucose-Capillary: 97 mg/dL (ref 70–99)

## 2020-05-03 LAB — PROTIME-INR
INR: 1.8 — ABNORMAL HIGH (ref 0.8–1.2)
Prothrombin Time: 20.1 seconds — ABNORMAL HIGH (ref 11.4–15.2)

## 2020-05-03 LAB — CBC
HCT: 29 % — ABNORMAL LOW (ref 36.0–46.0)
Hemoglobin: 9.6 g/dL — ABNORMAL LOW (ref 12.0–15.0)
MCH: 28.9 pg (ref 26.0–34.0)
MCHC: 33.1 g/dL (ref 30.0–36.0)
MCV: 87.3 fL (ref 80.0–100.0)
Platelets: 356 10*3/uL (ref 150–400)
RBC: 3.32 MIL/uL — ABNORMAL LOW (ref 3.87–5.11)
RDW: 14.5 % (ref 11.5–15.5)
WBC: 14.7 10*3/uL — ABNORMAL HIGH (ref 4.0–10.5)
nRBC: 0 % (ref 0.0–0.2)

## 2020-05-03 MED ORDER — POTASSIUM CHLORIDE 20 MEQ PO PACK
20.0000 meq | PACK | Freq: Every day | ORAL | Status: DC
  Administered 2020-05-04: 20 meq via ORAL
  Filled 2020-05-03 (×3): qty 1

## 2020-05-03 MED ORDER — METOPROLOL SUCCINATE ER 25 MG PO TB24
12.5000 mg | ORAL_TABLET | Freq: Every day | ORAL | Status: DC
  Administered 2020-05-03 – 2020-05-05 (×3): 12.5 mg via ORAL
  Filled 2020-05-03 (×3): qty 1

## 2020-05-03 MED ORDER — WARFARIN SODIUM 2.5 MG PO TABS
2.5000 mg | ORAL_TABLET | Freq: Every day | ORAL | Status: DC
  Administered 2020-05-03: 2.5 mg via ORAL
  Filled 2020-05-03: qty 1

## 2020-05-03 MED ORDER — POTASSIUM CHLORIDE 20 MEQ PO PACK
20.0000 meq | PACK | Freq: Once | ORAL | Status: AC
  Administered 2020-05-03: 20 meq via ORAL
  Filled 2020-05-03: qty 1

## 2020-05-03 NOTE — Care Management Important Message (Signed)
Important Message  Patient Details  Name: [name] [place] MRN: [id number] Date of Birth: [birth year]   Medicare Important Message Given:  Yes     Orbie Pyo 05/03/2020, 2:27 PM

## 2020-05-03 NOTE — TOC Initial Note (Signed)
Transition of Care [facility]) - Initial/Assessment Note    Patient Details  Name: [name] MRN: [id number] Date of Birth: Oct 28, 1950  Transition of Care [facility]) CM/SW Contact:    Tresa Endo Phone Number: 05/03/2020, 4:05 PM  Clinical Narrative:                 4:00pm- CSW contacted every number in emergency contact list there were no answers and the only vm available was her daughter's Emmie Niemann at [phone]. CSW left a VM, CSW also went to pt room at 3pm and pt is non-verbal, CSW will follow up after reaching out interpretation services.  Expected Discharge Plan: Skilled Nursing Facility Barriers to Discharge: Continued Medical Work up   Patient Goals and CMS Choice Patient states their goals for this hospitalization and ongoing recovery are:: to go to SNF CMS Medicare.gov Compare Post Acute Care list provided to:: Patient Choice offered to / list presented to : Patient  Expected Discharge Plan and Services Expected Discharge Plan: [place] In-house Referral: Clinical Social Work     Living arrangements for the past 2 months: Single Family Home                                      Prior Living Arrangements/Services Living arrangements for the past 2 months: Single Family Home Lives with:: Self,Spouse Patient language and need for interpreter reviewed:: Yes Do you feel safe going back to the place where you live?: No   SNF  Need for Family Participation in Patient Care: Yes (Comment) Care giver support system in place?: Yes (comment)   Criminal Activity/Legal Involvement Pertinent to Current Situation/Hospitalization: No - Comment as needed  Activities of Daily Living Home Assistive Devices/Equipment: Brace (specify type) ADL Screening (condition at time of admission) Patient's cognitive ability adequate to safely complete daily activities?: Yes Is the patient deaf or have difficulty hearing?: No Does the patient have  difficulty seeing, even when wearing glasses/contacts?: No Does the patient have difficulty concentrating, remembering, or making decisions?: No Patient able to express need for assistance with ADLs?: Yes Does the patient have difficulty dressing or bathing?: Yes Independently performs ADLs?: No Communication: Needs assistance Is this a change from baseline?: Pre-admission baseline Dressing (OT): Needs assistance Is this a change from baseline?: Pre-admission baseline Grooming: Needs assistance Is this a change from baseline?: Pre-admission baseline Feeding: Independent Bathing: Needs assistance Is this a change from baseline?: Pre-admission baseline Toileting: Needs assistance Is this a change from baseline?: Pre-admission baseline In/Out Bed: Independent Walks in Home: Independent Does the patient have difficulty walking or climbing stairs?: Yes Weakness of Legs: Right Weakness of Arms/Hands: Right  Permission Sought/Granted Permission sought to share information with : Case Manager,Family Chief Financial Officer Permission granted to share information with : Yes, Verbal Permission Granted  Share Information with NAME: [name].  Permission granted to share info w AGENCY: SNF  Permission granted to share info w Relationship: spouse  Permission granted to share info w Contact Information: [place] [phone]  Emotional Assessment Appearance:: Appears stated age Attitude/Demeanor/Rapport: Gracious Affect (typically observed): Calm Orientation: : Oriented to Self,Oriented to Place,Oriented to  Time,Oriented to Situation Alcohol / Substance Use: Not Applicable Psych Involvement: No (comment)  Admission diagnosis:  Third degree heart block ([place]) [I44.2] Complete heart block ([place]) [I44.2] Patient Active Problem List   Diagnosis Date Noted  . Third degree heart block ([place])   .  Complete heart block ([place]) 04/21/2020  . Bleeding tendency ([place]) 12/14/2019  .  Encounter for monitoring digoxin therapy 04/09/2019  . Status epilepticus ([place])   . Chronic combined systolic and diastolic heart failure ([place])   . Demand ischemia ([place])   . Acquired right foot drop 01/12/2017  . Osteoporosis 12/03/2014  . Vitamin D deficiency 12/19/2012  . Dysarthria as late effect of cerebrovascular accident (CVA) 12/12/2012  . Gait instability 12/12/2012  . Contracture of joint 12/12/2012  . Hemiplegia of dominant side as late effect following cerebrovascular disease ([place]) 12/12/2012  . Anticoagulated on Coumadin 09/19/2011  . Secondary cardiomyopathy ([place]) 08/24/2011  . (L) MCA cardio embolic stroke  56/43/3295  . H/O tobacco use 06/25/2011  . Weakness 05/28/2011  . History of CVA (cerebrovascular accident) 05/28/2011  . HTN (hypertension) 05/28/2011  . HLD (hyperlipidemia) 05/28/2011  . Seizure disorder ([place]) 05/28/2011   PCP:  Just, [name], FNP Pharmacy:   [facility] [place] [address] Phone: [phone] Fax: [phone]  [facility] [phone] [place], [place] - [address] [place] [address] Phone: [phone] Fax: [phone]     Social Determinants of Health (SDOH) Interventions    Readmission Risk Interventions No flowsheet data found.

## 2020-05-03 NOTE — Progress Notes (Addendum)
Patient ID: [name], female   DOB: 04/22/1950, 70 y.o.   MRN: [id number]     Advanced Heart Failure Rounding Note  PCP-Cardiologist: Skeet Latch, MD  AHF: Dr. Aundra Dubin   Subjective:   - Admitted for CHB.  - Echo with EF 35-40%, inferior and lateral HK, normal RV.  - Coronary angiography: 60-70% dLM, 60-70% ostial LCx, 99% proximal calcified LCx (large/codominant LCx) - 4/4  S/p CABG x 4 (LIMA-LAD, SVG-OM2) - 4/5 Extubated - 4/8 St Jude CRT-P   SBPs remains soft 90s-low 100s. Sinus tach most of the day yesterday and this am, low 100s-100s.   Hgb stable at 9.6 WBC 14.7 and AF  SCr/BUN 0.94/16 K 3.5   Sitting up in bed. No complaints. Eating breakfast.    Objective:   Weight Range: 57.5 kg Body mass index is 19.85 kg/m.   Vital Signs:   Temp:  [98.2 F (36.8 C)-98.7 F (37.1 C)] 98.4 F (36.9 C) (04/12 0300) Pulse Rate:  [102-112] 102 (04/12 0300) Resp:  [16-28] 16 (04/12 0300) BP: (89-101)/(55-63) 90/58 (04/12 0300) SpO2:  [96 %-100 %] 97 % (04/12 0300) Weight:  [57.5 kg] 57.5 kg (04/12 0300) Last BM Date: 05/02/20  Weight change: Filed Weights   05/01/20 0500 05/02/20 0300 05/03/20 0300  Weight: 58.2 kg 60.6 kg 57.5 kg    Intake/Output:   Intake/Output Summary (Last 24 hours) at 05/03/2020 0718 Last data filed at 05/02/2020 1900 Gross per 24 hour  Intake 200 ml  Output 800 ml  Net -600 ml      Physical Exam    General:  Elderly female, sitting up in bed. No resp difficulty. No distress HEENT: dentition in poor repair, otherwise normal Neck: supple. no JVD. Carotids 2+ bilat; no bruits. No lymphadenopathy or thryomegaly appreciated. Cor: PMI nondisplaced. Regular rhythm, tachy rate. No rubs, gallops or murmurs. Lungs: clear Abdomen: soft, nontender, nondistended. No hepatosplenomegaly. No bruits or masses. Good bowel sounds. Extremities: no cyanosis, clubbing, rash, edema Neuro: alert & follows commands. R hemiparesis and expressive  aphasia    Telemetry   Sinus with BiV pacing low 100s  (personally reviewed)  Labs    CBC Recent Labs    05/02/20 0040 05/03/20 0111  WBC 14.2* 14.7*  HGB 9.8* 9.6*  HCT 30.0* 29.0*  MCV 88.2 87.3  PLT 337 466   Basic Metabolic Panel Recent Labs    05/02/20 0040 05/03/20 0111  NA 138 137  K 4.0 3.5  CL 104 101  CO2 25 24  GLUCOSE 111* 115*  BUN 14 16  CREATININE 1.00 0.94  CALCIUM 8.6* 8.6*   Liver Function Tests No results for input(s): AST, ALT, ALKPHOS, BILITOT, PROT, ALBUMIN in the last 72 hours. No results for input(s): LIPASE, AMYLASE in the last 72 hours. Cardiac Enzymes No results for input(s): CKTOTAL, CKMB, CKMBINDEX, TROPONINI in the last 72 hours.  BNP: BNP (last 3 results) No results for input(s): BNP in the last 8760 hours.  ProBNP (last 3 results) No results for input(s): PROBNP in the last 8760 hours.   D-Dimer No results for input(s): DDIMER in the last 72 hours. Hemoglobin A1C No results for input(s): HGBA1C in the last 72 hours. Fasting Lipid Panel No results for input(s): CHOL, HDL, LDLCALC, TRIG, CHOLHDL, LDLDIRECT in the last 72 hours. Thyroid Function Tests No results for input(s): TSH, T4TOTAL, T3FREE, THYROIDAB in the last 72 hours.  Invalid input(s): FREET3  Other results:   Imaging    No results found.  Medications:     Scheduled Medications: . sodium chloride   Intravenous Once  . aspirin EC  81 mg Oral Daily  . atorvastatin  80 mg Oral QHS  . baclofen  20 mg Oral BID  . bisacodyl  10 mg Oral Daily   Or  . bisacodyl  10 mg Rectal Daily  . Chlorhexidine Gluconate Cloth  6 each Topical Daily  . docusate sodium  200 mg Oral Daily  . enoxaparin (LOVENOX) injection  30 mg Subcutaneous Q24H  . furosemide  40 mg Oral Daily  . Gerhardt's butt cream   Topical BID  . insulin aspart  0-9 Units Subcutaneous TID WC  . lacosamide  100 mg Oral BID  . levETIRAcetam  1,500 mg Oral BID  . mouth rinse  15 mL Mouth Rinse  BID  . pantoprazole  40 mg Oral Daily  . potassium chloride  20 mEq Oral Once  . sacubitril-valsartan  1 tablet Oral BID  . sodium chloride flush  3 mL Intravenous Q12H  . spironolactone  12.5 mg Oral Daily  . Warfarin - Pharmacist Dosing Inpatient   Does not apply q1600    Infusions: . sodium chloride    . sodium chloride    . sodium chloride    . sodium chloride 50 mL/hr at 05/01/20 0600  . lactated ringers      PRN Medications: sodium chloride, acetaminophen, ondansetron (ZOFRAN) IV, oxyCODONE, polyethylene glycol, sodium chloride flush, traMADol   Assessment/Plan   1. Complete heart block: Patient had baseline NSR with LAFB/RBBB, so there was underlying conduction system disease.  Suspect this progressed to CHB with beta blocker/digoxin use + chronic ischemia in territory of large  co-dominant LCx.  CHB recurred after CABG, now s/p St Jude CRT-P.  - Can eventually resume HF beta blocker.  2. Chronic systolic CHF: Most recent prior echo in 8/20 with EF 35-40% and wall motion abnormalities.  Ischemic cardiomyopathy with left main/severe codominant LCx disease.  Echo this admission with stable EF 35-40%, inferior and lateral hypokinesis.  Repeat echo after CABG fairly stable EF 30-35%.  Not significantly volume overloaded on exam. Per her husband, at baseline she is aphasic but can walk around the house and do her ADLs. Now s/p CABG 4/4. Off inotropes/pressors.  - on PO Lasix 40 daily. Not fluid overloaded on exam. W/ low BP and tachycardia, may be a little dry. Hold lasix today  - Continue titration of GDMT as BP allows  - Continue spiro 12.5  - Continue Entresto 24/26 bid, BP too soft to titrate  - Plan SGLT2i soon (an add outpatient)   3. 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  Has been on warfarin.  Has had long-standing aphasia and right hemiparesis.   - Warfarin resumed by surgery. INR 1.8 today 4. H/o seizure disorder: Continue Keppra. Quiescent  5. ?Sickle cell anemia:  Carries this diagnosis but not sure truly present.  6. CAD:  LHC showed 60-70% distal left main stenosis, heavily calcified proximal LCx with 60-70% stenosis at the ostium and discrete 99% stenosis in the proximal LCx.  I suspect that her CHB is related to chronic ischemia from LM-LCx disease worsened by nodal blockade.  Difficult situation.  She has surgical disease but will be more difficult to rehab after CABG with weakness from prior stroke though per her husband, she does all ADLs and walks with cane at home.  PCI would be possible but high risk/difficult heavy calcification and tortuousity. Had CABG  x 2 4/4 w/ LIMA-LAD, SVG-OM2  - Continue ASA 81, statin.  7. Continue aggressive PT work, out of bed.  Will need SNF.   Length of Stay: [address], PA-C  05/03/2020, 7:18 AM  Advanced Heart Failure Team Pager [phone] (M-F; 7a - 5p)  Please contact [facility] for night-coverage after hours (5p -7a ) and weekends on [email or web address]  Patient seen with PA, agree with the above note.   Weight down to baseline.  SBP 90s-110s.  Tolerating meds.  Mild sinus tachy.   General: NAD Neck: No JVD, no thyromegaly or thyroid nodule.  Lungs: Clear to auscultation bilaterally with normal respiratory effort. CV: Nondisplaced PMI.  Heart regular S1/S2, no S3/S4, no murmur.  No peripheral edema.  No carotid bruit.  Normal pedal pulses.  Abdomen: Soft, nontender, no hepatosplenomegaly, no distention.  Skin: Intact without lesions or rashes.  Neurologic: Aphasia, right-sided weakness. Psych: Normal affect. Extremities: No clubbing or cyanosis.  HEENT: Normal.   SBP 90s-110s, tolerating without dizziness.  Can hold Lasix today, probably resume 20 mg po daily tomorrow.  Can add low dose Toprol XL 12.5 mg qhs.    Ready for SNF from my standpoint.   Loralie Champagne 05/03/2020 8:33 AM

## 2020-05-03 NOTE — Progress Notes (Signed)
[name] for Warfarin  Indication: stroke  Allergies  Allergen Reactions  . Lexiscan [Regadenoson] Other (See Comments)    Perioral tingling and throat tightness    Patient Measurements: Height: 5\' 7"  (170.2 cm) Weight: 57.5 kg (126 lb 12.2 oz) IBW/kg (Calculated) : 61.6 Heparin Dosing Weight: 57 kg  Vital Signs: Temp: 98.2 F (36.8 C) (04/12 0730) Temp Source: Oral (04/12 0730) BP: 90/58 (04/12 0300) Pulse Rate: 102 (04/12 0300)  Labs: Recent Labs    05/01/20 0715 05/02/20 0040 05/03/20 0111  HGB 9.3* 9.8* 9.6*  HCT 27.8* 30.0* 29.0*  PLT 278 337 356  LABPROT 17.3* 18.0* 20.1*  INR 1.5* 1.6* 1.8*  CREATININE 0.75 1.00 0.94    Estimated Creatinine Clearance: 49.8 mL/min (by C-G formula based on SCr of 0.94 mg/dL).   Medical History: Past Medical History:  Diagnosis Date  . Adenomatous polyp of colon 09/2012   repeat colonoscopy in 5 years by Dr. Sharlett Iles  . CHF (congestive heart failure) (HCC)    EF 15-20% as of 05/28/11 (Dr Legrand [facility] summary)  . CVA (cerebral infarction) 12/2007  . Hemiparesis ([place])   . Hyperlipidemia   . Hypertension   . Seizures ([place])   . Sickle cell anemia (HCC)   . Stroke ([place])   . Vitamin D deficiency 2010    Assessment: 70 yo female presents with CHB now s/p temporary pacer. On warfarin PTA for hx embolic stroke, no known hx of afib. INR on admit therapeutic at 2.2. LHC on 4/1 shows LM-LCx disease, PCI held for CABG consult. Pharmacy asked start heparin 8 hr after sheath pull and when INR <2.   PTA warfarin regimen: 5 mg daily except 7.5 mg Mon/Thurs INR on admit: 2.2  INR 1.8 < goal but increasing slow ly.  Will titrate slowly given CABG and PPM No overt bleeding or complications noted.  CBC stable.  Goal of Therapy:  INR 2-3 Monitor platelets by anticoagulation protocol: Yes   Plan:  Warfarin 2.5 mg x 1 tonight-repeat Daily INR. Stop enoxaparin today     [name] Pharm.D. CPP, BCPS Clinical Pharmacist [phone] 05/03/2020 11:52 AM     [facility] pharmacy phone numbers are listed on [email or web address]

## 2020-05-03 NOTE — Progress Notes (Addendum)
      [address]       [place] [address]             [phone]      4 Days Post-Op Procedure(s) (LRB): BIV PACEMAKER INSERTION CRT-P (N/A) Subjective: Feels okay this morning. She did work some with PT/OT yesterday  Objective: Vital signs in last 24 hours: Temp:  [98.2 F (36.8 C)-98.7 F (37.1 C)] 98.4 F (36.9 C) (04/12 0300) Pulse Rate:  [102-112] 102 (04/12 0300) Cardiac Rhythm: Ventricular paced (04/12 0431) Resp:  [16-28] 16 (04/12 0300) BP: (89-101)/(55-63) 90/58 (04/12 0300) SpO2:  [96 %-100 %] 97 % (04/12 0300) Weight:  [57.5 kg] 57.5 kg (04/12 0300)     Intake/Output from previous day: 04/11 0701 - 04/12 0700 In: 200 [P.O.:200] Out: 800 [Urine:800] Intake/Output this shift: No intake/output data recorded.  General appearance: alert, cooperative and no distress Heart: regular rate and rhythm, S1, S2 normal, no murmur, click, rub or gallop Lungs: clear to auscultation bilaterally Abdomen: soft, non-tender; bowel sounds normal; no masses,  no organomegaly Extremities: edema in right leg (EVH side) Wound: clean and dry  Lab Results: Recent Labs    05/02/20 0040 05/03/20 0111  WBC 14.2* 14.7*  HGB 9.8* 9.6*  HCT 30.0* 29.0*  PLT 337 356   BMET:  Recent Labs    05/02/20 0040 05/03/20 0111  NA 138 137  K 4.0 3.5  CL 104 101  CO2 25 24  GLUCOSE 111* 115*  BUN 14 16  CREATININE 1.00 0.94  CALCIUM 8.6* 8.6*    PT/INR:  Recent Labs    05/03/20 0111  LABPROT 20.1*  INR 1.8*   ABG    Component Value Date/Time   PHART 7.378 04/26/2020 0934   HCO3 21.9 04/26/2020 0934   TCO2 23 04/26/2020 0934   ACIDBASEDEF 3.0 (H) 04/26/2020 0934   O2SAT 59.5 04/28/2020 0436   CBG (last 3)  Recent Labs    05/02/20 1627 05/02/20 2137 05/03/20 0541  GLUCAP 148* 115* 113*    Assessment/Plan: S/P Procedure(s) (LRB): BIV PACEMAKER INSERTION CRT-P (N/A)  1. CV-PPM placed, stable AV paced rhythm, hypotensive at times, HF  following and titrating medications 2. Pulm-tolerating room air with good oxygen saturation 3. Renal- creatinine 0.94, replace potassium 4. H and H 9.6/29.0, stable  5. Endo-blood glucose well controlled 6. Embolic CVA (remote hx [id number])-[id number] has been restarted. INR 1.8  Plan: She will need a nursing facility at discharge. Progressing well, she is a little hypotensive again this morning but asymptomatic. Stable from a surgical standpoint to transfer to SNF but will wait for HF input.    LOS: 12 days    Elgie Collard 05/03/2020 Patient seen and examined, agree with above  Remo Lipps C. Roxan Hockey, MD [facility] [phone]

## 2020-05-04 DIAGNOSIS — I5022 Chronic systolic (congestive) heart failure: Secondary | ICD-10-CM | POA: Diagnosis not present

## 2020-05-04 LAB — BASIC METABOLIC PANEL
Anion gap: 7 (ref 5–15)
BUN: 11 mg/dL (ref 8–23)
CO2: 24 mmol/L (ref 22–32)
Calcium: 8.5 mg/dL — ABNORMAL LOW (ref 8.9–10.3)
Chloride: 105 mmol/L (ref 98–111)
Creatinine, Ser: 0.83 mg/dL (ref 0.44–1.00)
GFR, Estimated: >60 mL/min (ref >60)
Glucose, Bld: 100 mg/dL — ABNORMAL HIGH (ref 70–99)
Potassium: 3.9 mmol/L (ref 3.5–5.1)
Sodium: 136 mmol/L (ref 135–145)

## 2020-05-04 LAB — PROTIME-INR
INR: 2 — ABNORMAL HIGH (ref 0.8–1.2)
Prothrombin Time: 22.4 seconds — ABNORMAL HIGH (ref 11.4–15.2)

## 2020-05-04 LAB — CBC
HCT: 28.2 % — ABNORMAL LOW (ref 36.0–46.0)
Hemoglobin: 9.4 g/dL — ABNORMAL LOW (ref 12.0–15.0)
MCH: 29.2 pg (ref 26.0–34.0)
MCHC: 33.3 g/dL (ref 30.0–36.0)
MCV: 87.6 fL (ref 80.0–100.0)
Platelets: 429 10*3/uL — ABNORMAL HIGH (ref 150–400)
RBC: 3.22 MIL/uL — ABNORMAL LOW (ref 3.87–5.11)
RDW: 14.6 % (ref 11.5–15.5)
WBC: 12.6 10*3/uL — ABNORMAL HIGH (ref 4.0–10.5)
nRBC: 0 % (ref 0.0–0.2)

## 2020-05-04 LAB — GLUCOSE, CAPILLARY
Glucose-Capillary: 98 mg/dL (ref 70–99)
Glucose-Capillary: 105 mg/dL — ABNORMAL HIGH (ref 70–99)
Glucose-Capillary: 114 mg/dL — ABNORMAL HIGH (ref 70–99)
Glucose-Capillary: 124 mg/dL — ABNORMAL HIGH (ref 70–99)

## 2020-05-04 MED ORDER — COVID-19 MRNA VAC-TRIS(PFIZER) 30 MCG/0.3ML IM SUSP
0.3000 mL | Freq: Once | INTRAMUSCULAR | Status: AC
  Administered 2020-05-04: 0.3 mL via INTRAMUSCULAR
  Filled 2020-05-04: qty 0.3

## 2020-05-04 MED ORDER — WARFARIN SODIUM 3 MG PO TABS
3.0000 mg | ORAL_TABLET | Freq: Once | ORAL | Status: AC
  Administered 2020-05-04: 3 mg via ORAL
  Filled 2020-05-04: qty 1

## 2020-05-04 MED ORDER — COLCHICINE 0.6 MG PO TABS
0.6000 mg | ORAL_TABLET | Freq: Every day | ORAL | Status: DC
  Administered 2020-05-04 – 2020-05-05 (×2): 0.6 mg via ORAL
  Filled 2020-05-04 (×2): qty 1

## 2020-05-04 NOTE — TOC Progression Note (Addendum)
Transition of Care [facility]) - Progression Note    Patient Details  Name: [name] MRN: [id number] Date of Birth: [birth year]  [facility] CM/SW Contact  Reece Agar, [place] Phone Number: 05/04/2020, 9:48 AM  Clinical Narrative:    9:00am- CSW spoke with pt husband and daughter, they both would like for pt to go to a SNF and have asked for [facility], [facility], and [facility]. Husband requested covid booster for pt. CSW reached out to RN and she was able to get [employer] booster approved through pharmacy. CSW contacted pt husband back and confirmed that [facility] has accepted thus far and [facility] is still pending although that was first choice. Pt [facility] decided [facility] since they were closer to his home and they have already accepted. [facility] contacted [facility] admissions. There was no answer or VM space CSW will follow up.  10:58am- CSW contacted [facility] admission, they are willing to take pt after insurance is verified.   11:40am- CSW contacted [facility] for insurance auth, they said this pt insurance looks like it is inactive and will check on the reason why then follow up with me. CSW contacted pt husband to update him on acceptance of facility and insurance pending. CSW will follow up with family.  12:30pm- [facility] contacted CSW and explained they do not manage this pt, admissions at [facility] will [facility] auth and husband will bring Entresto to facility at dc.  Expected Discharge Plan: [place] Barriers to Discharge: Continued Medical Work up  Expected Discharge Plan and Services Expected Discharge Plan: [place] In-house Referral: Clinical Social Work     Living arrangements for the past 2 months: Single Family Home                                       Social Determinants of Health (SDOH) Interventions    Readmission Risk Interventions No flowsheet data found.

## 2020-05-04 NOTE — Progress Notes (Addendum)
Patient ID: [name], female   DOB: August 12, 1950, 70 y.o.   MRN: [id number]     Advanced Heart Failure Rounding Note  PCP-Cardiologist: Skeet Latch, MD  AHF: Dr. Aundra Dubin   Subjective:   - Admitted for CHB.  - Echo with EF 35-40%, inferior and lateral HK, normal RV.  - Coronary angiography: 60-70% dLM, 60-70% ostial LCx, 99% proximal calcified LCx (large/codominant LCx) - 4/4  S/p CABG x 4 (LIMA-LAD, SVG-OM2) - 4/5 Extubated - 4/8 St Jude CRT-P   Yesterday toprol xl added last night.    Objective:   Weight Range: 56 kg Body mass index is 19.34 kg/m.   Vital Signs:   Temp:  [98.2 F (36.8 C)-99.2 F (37.3 C)] 98.5 F (36.9 C) (04/13 0315) Pulse Rate:  [100-104] 100 (04/13 0315) Resp:  [16-21] 16 (04/13 0315) BP: (85-111)/(55-68) 85/55 (04/13 0315) SpO2:  [97 %] 97 % (04/13 0315) Weight:  [56 kg] 56 kg (04/13 0315) Last BM Date: 05/02/20  Weight change: Filed Weights   05/02/20 0300 05/03/20 0300 05/04/20 0315  Weight: 60.6 kg 57.5 kg 56 kg    Intake/Output:   Intake/Output Summary (Last 24 hours) at 05/04/2020 1057 Last data filed at 05/04/2020 0405 Gross per 24 hour  Intake 205 ml  Output 700 ml  Net -495 ml      Physical Exam  General:   No resp difficulty HEENT: normal Neck: supple. no JVD. Carotids 2+ bilat; no bruits. No lymphadenopathy or thryomegaly appreciated. Cor: PMI nondisplaced. Regular rate & rhythm. No rubs, gallops or murmurs. Sternal incision approximated.  Lungs: clear Abdomen: soft, nontender, nondistended. No hepatosplenomegaly. No bruits or masses. Good bowel sounds. Extremities: no cyanosis, clubbing, rash, edema Neuro: alert and follows commands. R hemiparesis and expressive aphasia.    Telemetry  Bi V Pacing 90s    Labs    CBC Recent Labs    05/03/20 0111 05/04/20 0232  WBC 14.7* 12.6*  HGB 9.6* 9.4*  HCT 29.0* 28.2*  MCV 87.3 87.6  PLT 356 740*   Basic Metabolic Panel Recent Labs    05/03/20 0111  05/04/20 0232  NA 137 136  K 3.5 3.9  CL 101 105  CO2 24 24  GLUCOSE 115* 100*  BUN 16 11  CREATININE 0.94 0.83  CALCIUM 8.6* 8.5*   Liver Function Tests No results for input(s): AST, ALT, ALKPHOS, BILITOT, PROT, ALBUMIN in the last 72 hours. No results for input(s): LIPASE, AMYLASE in the last 72 hours. Cardiac Enzymes No results for input(s): CKTOTAL, CKMB, CKMBINDEX, TROPONINI in the last 72 hours.  BNP: BNP (last 3 results) No results for input(s): BNP in the last 8760 hours.  ProBNP (last 3 results) No results for input(s): PROBNP in the last 8760 hours.   D-Dimer No results for input(s): DDIMER in the last 72 hours. Hemoglobin A1C No results for input(s): HGBA1C in the last 72 hours. Fasting Lipid Panel No results for input(s): CHOL, HDL, LDLCALC, TRIG, CHOLHDL, LDLDIRECT in the last 72 hours. Thyroid Function Tests No results for input(s): TSH, T4TOTAL, T3FREE, THYROIDAB in the last 72 hours.  Invalid input(s): FREET3  Other results:   Imaging    No results found.   Medications:     Scheduled Medications: . sodium chloride   Intravenous Once  . aspirin EC  81 mg Oral Daily  . atorvastatin  80 mg Oral QHS  . baclofen  20 mg Oral BID  . bisacodyl  10 mg Oral Daily  Or  . bisacodyl  10 mg Rectal Daily  . Chlorhexidine Gluconate Cloth  6 each Topical Daily  . colchicine  0.6 mg Oral Daily  . COVID-19 mRNA Vac-TriS (Pf[employer])  0.3 mL Intramuscular Once  . docusate sodium  200 mg Oral Daily  . Gerhardt's butt cream   Topical BID  . insulin aspart  0-9 Units Subcutaneous TID WC  . lacosamide  100 mg Oral BID  . levETIRAcetam  1,500 mg Oral BID  . mouth rinse  15 mL Mouth Rinse BID  . metoprolol succinate  12.5 mg Oral QHS  . pantoprazole  40 mg Oral Daily  . potassium chloride  20 mEq Oral Daily  . sacubitril-valsartan  1 tablet Oral BID  . sodium chloride flush  3 mL Intravenous Q12H  . spironolactone  12.5 mg Oral Daily  . warfarin  2.5 mg Oral  q1600  . Warfarin - Pharmacist Dosing Inpatient   Does not apply q1600    Infusions: . sodium chloride    . sodium chloride    . sodium chloride    . sodium chloride 50 mL/hr at 05/01/20 0600  . lactated ringers      PRN Medications: sodium chloride, acetaminophen, ondansetron (ZOFRAN) IV, oxyCODONE, polyethylene glycol, sodium chloride flush, traMADol   Assessment/Plan   1. Complete heart block: Patient had baseline NSR with LAFB/RBBB, so there was underlying conduction system disease.  Suspect this progressed to CHB with beta blocker/digoxin use + chronic ischemia in territory of large  co-dominant LCx.  CHB recurred after CABG, now s/p St Jude CRT-P.  - Can eventually resume HF beta blocker.  2. Chronic systolic CHF: Most recent prior echo in 8/20 with EF 35-40% and wall motion abnormalities.  Ischemic cardiomyopathy with left main/severe codominant LCx disease.  Echo this admission with stable EF 35-40%, inferior and lateral hypokinesis.  Repeat echo after CABG fairly stable EF 30-35%.  Not significantly volume overloaded on exam. Per her husband, at baseline she is aphasic but can walk around the house and do her ADLs. Now s/p CABG 4/4. Off inotropes/pressors.  - Volume status stable. Start lasix 20 mg daily tomorrow.  - Continue titration of GDMT as BP allows  - Continue Toprol XL 12.5 mg daily.  - Continue spiro 12.5  - Continue Entresto 24/26 bid, BP too soft to titrate  - Plan SGLT2i soon (add outpatient)   3. 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  Has been on warfarin.  Has had long-standing aphasia and right hemiparesis.   - Warfarin resumed by surgery. INR 2 today 4. H/o seizure disorder: Continue Keppra. Quiescent  5. ?Sickle cell anemia: Carries this diagnosis but not sure truly present.  6. CAD:  LHC showed 60-70% distal left main stenosis, heavily calcified proximal LCx with 60-70% stenosis at the ostium and discrete 99% stenosis in the proximal LCx.  I suspect that  her CHB is related to chronic ischemia from LM-LCx disease worsened by nodal blockade.  Difficult situation.  She has surgical disease but will be more difficult to rehab after CABG with weakness from prior stroke though per her husband, she does all ADLs and walks with cane at home.  PCI would be possible but high risk/difficult heavy calcification and tortuousity. Had CABG x 2 4/4 w/ LIMA-LAD, SVG-OM2  - Continue ASA 81, statin.  7. Continue aggressive PT work, out of bed.    Plan for SNF hopefully today.  HF meds for d/c  Lasix 20 mg  daily start 4/14 Toprol XL 12.5 mg daily at bed time Spironolactone 12.5 mg daily  Entresto 24-26 mg twice a day.  Atovastatin 80 mg daily ASA 81 mg daily.   HF F/U will be set up.    Length of Stay: [place], NP  05/04/2020, 10:57 AM  Advanced Heart Failure Team Pager [phone] (M-F; 7a - 5p)  Please contact [facility] for night-coverage after hours (5p -7a ) and weekends on [email or web address]  Agree with the above NP note.   Patient ok for discharge to SNF today.  Would send out on the above meds.  Will need CHF clinic followup and followup with EP for CRT device.   Loralie Champagne 05/04/2020

## 2020-05-04 NOTE — Progress Notes (Signed)
Discussed IS, sternal precautions, nutrition with pt. Receptive. Gave her OHS booklet and Moving in the Tube sheet. Gave HH diet. She voices that her teeth are problematic. Gave her chocolate ensure which she liked. We will sign off as she is nonambulatory [phone]

## 2020-05-04 NOTE — Progress Notes (Addendum)
      [address]       [place] [address]             [phone]      5 Days Post-Op Procedure(s) (LRB): BIV PACEMAKER INSERTION CRT-P (N/A) Subjective: Feels okay this morning. Ready to go to a nursing facility  Objective: Vital signs in last 24 hours: Temp:  [98.2 F (36.8 C)-99.2 F (37.3 C)] 98.5 F (36.9 C) (04/13 0315) Pulse Rate:  [100-104] 100 (04/13 0315) Cardiac Rhythm: Ventricular paced (04/13 0315) Resp:  [16-21] 16 (04/13 0315) BP: (85-111)/(55-68) 85/55 (04/13 0315) SpO2:  [97 %] 97 % (04/13 0315) Weight:  [56 kg] 56 kg (04/13 0315)     Intake/Output from previous day: 04/12 0701 - 04/13 0700 In: 325 [P.O.:320; I.V.:5] Out: 700 [Urine:700] Intake/Output this shift: No intake/output data recorded.  General appearance: alert, cooperative and no distress Heart: regular rate and rhythm, S1, S2 normal, no murmur, click, rub or gallop Lungs: clear to auscultation bilaterally Abdomen: soft, non-tender; bowel sounds normal; no masses,  no organomegaly Extremities: extremities normal, atraumatic, no cyanosis or edema Wound: clean and dry  Lab Results: Recent Labs    05/03/20 0111 05/04/20 0232  WBC 14.7* 12.6*  HGB 9.6* 9.4*  HCT 29.0* 28.2*  PLT 356 429*   BMET:  Recent Labs    05/03/20 0111 05/04/20 0232  NA 137 136  K 3.5 3.9  CL 101 105  CO2 24 24  GLUCOSE 115* 100*  BUN 16 11  CREATININE 0.94 0.83  CALCIUM 8.6* 8.5*    PT/INR:  Recent Labs    05/04/20 0232  LABPROT 22.4*  INR 2.0*   ABG    Component Value Date/Time   PHART 7.378 04/26/2020 0934   HCO3 21.9 04/26/2020 0934   TCO2 23 04/26/2020 0934   ACIDBASEDEF 3.0 (H) 04/26/2020 0934   O2SAT 59.5 04/28/2020 0436   CBG (last 3)  Recent Labs    05/03/20 1545 05/03/20 2059 05/04/20 0617  GLUCAP 101* 97 98    Assessment/Plan: S/P Procedure(s) (LRB): BIV PACEMAKER INSERTION CRT-P (N/A)  1. CV-PPM placed, stable AV paced rhythm, hypotensive at  times, HF following and titrating medications 2. Pulm-tolerating room air with good oxygen saturation 3. Renal- creatinine 0.94, potassium improved this morning 4. H and H 9.4/28.2, stable  5. Endo-blood glucose well controlled 6. Embolic CVA (remote [id number] has been restarted. INR 2.0  Plan:  Ready for SNF, case management aware and working on placement. Discharge instructions reviewed with the patient.     LOS: 13 days    Elgie Collard 05/04/2020 Patient seen and examined, agree with above C/o pain right ankle and right wrist. Well perfused, some redness and tenderness around joints. Possible gout- will try colchicine To SNF once arrangements in place  [place] C. Roxan Hockey, MD [facility] [phone]

## 2020-05-04 NOTE — Progress Notes (Signed)
[facility] for Warfarin  Indication: stroke  Allergies  Allergen Reactions  . Lexiscan [Regadenoson] Other (See Comments)    Perioral tingling and throat tightness    Patient Measurements: Height: 5\' 7"  (170.2 cm) Weight: 56 kg (123 lb 7.3 oz) IBW/kg (Calculated) : 61.6 Heparin Dosing Weight: 57 kg  Vital Signs: Temp: 98.5 F (36.9 C) (04/13 0315) Temp Source: Oral (04/13 0315) BP: 85/55 (04/13 0315) Pulse Rate: 100 (04/13 0315)  Labs: Recent Labs    05/02/20 0040 05/03/20 0111 05/04/20 0232  HGB 9.8* 9.6* 9.4*  HCT 30.0* 29.0* 28.2*  PLT 337 356 429*  LABPROT 18.0* 20.1* 22.4*  INR 1.6* 1.8* 2.0*  CREATININE 1.00 0.94 0.83    Estimated Creatinine Clearance: 55 mL/min (by C-G formula based on SCr of 0.83 mg/dL).   Medical History: Past Medical History:  Diagnosis Date  . Adenomatous polyp of colon 09/2012   repeat colonoscopy in 5 years by Dr. Sharlett Iles  . CHF (congestive heart failure) (HCC)    EF 15-20% as of 05/28/11 (Dr Legrand [facility] D/C summary)  . CVA (cerebral infarction) 12/2007  . Hemiparesis ([place])   . Hyperlipidemia   . Hypertension   . Seizures ([place])   . Sickle cell anemia (HCC)   . Stroke ([place])   . Vitamin D deficiency 2010    Assessment: 70 yo female presents with CHB now s/p temporary pacer. On warfarin PTA for hx embolic stroke, no known hx of afib. INR on admit therapeutic at 2.2. LHC on 4/1 shows LM-LCx disease, PCI held for CABG consult. Pharmacy asked start heparin 8 hr after sheath pull and when INR <2.   PTA warfarin regimen: 5 mg daily except 7.5 mg Mon/Thurs INR on admit: 2.2  INR = 2 at goal. increasing slowly.  Will titrate slowly given CABG and PPM No overt bleeding or complications noted.  CBC stable.  Goal of Therapy:  INR 2-3 Monitor platelets by anticoagulation protocol: Yes   Plan:  Warfarin 3 mg x 1 tonight Daily INR.   [place] Crane, Roylene Reason, BCCP Clinical  Pharmacist  05/04/2020 12:56 PM   [facility] pharmacy phone numbers are listed on [email or web address]

## 2020-05-04 NOTE — Plan of Care (Signed)
  Problem: Education: Goal: Knowledge of General Education information will improve Description: Including pain rating scale, medication(s)/side effects and non-pharmacologic comfort measures Outcome: Progressing   Problem: Health Behavior/Discharge Planning: Goal: Ability to manage health-related needs will improve Outcome: Progressing   Problem: Clinical Measurements: Goal: Ability to maintain clinical measurements within normal limits will improve Outcome: Progressing Goal: Will remain free from infection Outcome: Progressing Goal: Diagnostic test results will improve Outcome: Progressing Goal: Respiratory complications will improve Outcome: Progressing Goal: Cardiovascular complication will be avoided Outcome: Progressing   Problem: Activity: Goal: Risk for activity intolerance will decrease Outcome: Progressing   Problem: Nutrition: Goal: Adequate nutrition will be maintained Outcome: Progressing   Problem: Coping: Goal: Level of anxiety will decrease Outcome: Progressing   Problem: Elimination: Goal: Will not experience complications related to bowel motility Outcome: Progressing Goal: Will not experience complications related to urinary retention Outcome: Progressing   Problem: Pain Managment: Goal: General experience of comfort will improve Outcome: Progressing   Problem: Safety: Goal: Ability to remain free from injury will improve Outcome: Progressing   Problem: Skin Integrity: Goal: Risk for impaired skin integrity will decrease Outcome: Progressing   Problem: Education: Goal: Understanding of CV disease, CV risk reduction, and recovery process will improve Outcome: Progressing   Problem: Activity: Goal: Ability to return to baseline activity level will improve Outcome: Progressing   Problem: Cardiovascular: Goal: Ability to achieve and maintain adequate cardiovascular perfusion will improve Outcome: Progressing Goal: Vascular access site(s)  Level 0-1 will be maintained Outcome: Progressing   Problem: Health Behavior/Discharge Planning: Goal: Ability to safely manage health-related needs after discharge will improve Outcome: Progressing   Problem: Education: Goal: Understanding of CV disease, CV risk reduction, and recovery process will improve Outcome: Progressing Goal: Individualized Educational Video(s) Outcome: Progressing   Problem: Activity: Goal: Ability to return to baseline activity level will improve Outcome: Progressing   Problem: Cardiovascular: Goal: Ability to achieve and maintain adequate cardiovascular perfusion will improve Outcome: Progressing Goal: Vascular access site(s) Level 0-1 will be maintained Outcome: Progressing   Problem: Health Behavior/Discharge Planning: Goal: Ability to safely manage health-related needs after discharge will improve Outcome: Progressing

## 2020-05-04 NOTE — NC FL2 (Signed)
[place] LEVEL OF CARE SCREENING TOOL     IDENTIFICATION  Patient Name: [name] Birthdate: October 14, 1950 Sex: female Admission Date (Current Location): 04/21/2020  [facility] and [place] Number:  Herbalist and Address:  [facility], [place] [address], [place], [place] [address]      Provider Number: [id number]  Attending Physician Name and Address:  Melrose Nakayama, MD  Relative Name and Phone Number:  [place] [phone]    Current Level of Care: Hospital Recommended Level of Care: [place] Prior Approval Number:    Date Approved/Denied:   PASRR Number: [id number]  Discharge Plan: SNF    Current Diagnoses: Patient Active Problem List   Diagnosis Date Noted  . Third degree heart block ([place])   . Complete heart block ([place]) 04/21/2020  . Bleeding tendency ([place]) 12/14/2019  . Encounter for monitoring digoxin therapy 04/09/2019  . Status epilepticus ([place])   . Chronic combined systolic and diastolic heart failure ([place])   . Demand ischemia ([place])   . Acquired right foot drop 01/12/2017  . Osteoporosis 12/03/2014  . Vitamin D deficiency 12/19/2012  . Dysarthria as late effect of cerebrovascular accident (CVA) 12/12/2012  . Gait instability 12/12/2012  . Contracture of joint 12/12/2012  . Hemiplegia of dominant side as late effect following cerebrovascular disease ([place]) 12/12/2012  . Anticoagulated on Coumadin 09/19/2011  . Secondary cardiomyopathy ([place]) 08/24/2011  . (L) MCA cardio embolic stroke  22/97/9892  . H/O tobacco use 06/25/2011  . Weakness 05/28/2011  . History of CVA (cerebrovascular accident) 05/28/2011  . HTN (hypertension) 05/28/2011  . HLD (hyperlipidemia) 05/28/2011  . Seizure disorder ([place]) 05/28/2011    Orientation RESPIRATION BLADDER Height & Weight     Self,Time,Situation,Place  Normal Incontinent,External catheter (External Urinary Catheter) Weight: 123 lb 7.3 oz  (56 kg) Height:  5\' 7"  (170.2 cm)  BEHAVIORAL SYMPTOMS/MOOD NEUROLOGICAL BOWEL NUTRITION STATUS      Continent Diet (See Discharge Summary)  AMBULATORY STATUS COMMUNICATION OF NEEDS Skin   Extensive Assist  (difficulty speaking) Other (Comment) (Inc. Closed Chest Other,silver hydrofiber,clean,dry,intact,dressing in place,Inc.Closed Leg Right,clean,dry,attached edges,skin glue)                       Personal Care Assistance Level of Assistance  Bathing,Feeding,Dressing Bathing Assistance: Limited assistance Feeding assistance: Independent Dressing Assistance: Limited assistance     Functional Limitations Info  Sight,Hearing,Speech   Hearing Info: Adequate Speech Info: Impaired (Expressive Aphasia)    SPECIAL CARE FACTORS FREQUENCY  PT (By licensed PT),OT (By licensed OT)     PT Frequency: 5x min weekly OT Frequency: 5x min weekly            Contractures Contractures Info: Not present    Additional Factors Info  Code Status,Allergies,Insulin Sliding Scale Code Status Info: FULL Allergies Info: Lexiscan (regadenoson)   Insulin Sliding Scale Info: insulin aspart (novoLOG) injection 0-9 Units 3 times daily with meals       Current Medications (05/04/2020):  This is the current hospital active medication list Current Facility-Administered Medications  Medication Dose Route Frequency Provider Last Rate Last Admin  . 0.45 % sodium chloride infusion   Intravenous Continuous PRN Barrett, Erin R, PA-C      . 0.9 %  sodium chloride infusion (Manually program via Guardrails IV Fluids)   Intravenous Once Eligha Bridegroom, CRNA      . 0.9 %  sodium chloride infusion  250 mL Intravenous Continuous Barrett, Junie Panning  R, PA-C      . 0.9 %  sodium chloride infusion   Intravenous Continuous Barrett, Erin R, PA-C      . 0.9 %  sodium chloride infusion   Intravenous Continuous Shirley Friar, PA-C 50 mL/hr at 05/01/20 0600 Infusion Verify at 05/01/20 0600  . acetaminophen (TYLENOL)  tablet 325-650 mg  325-650 mg Oral Q4H PRN Constance Haw, MD   325 mg at 05/01/20 0867  . aspirin EC tablet 81 mg  81 mg Oral Daily Rexene Alberts, MD   81 mg at 05/04/20 6195  . atorvastatin (LIPITOR) tablet 80 mg  80 mg Oral QHS Melrose Nakayama, MD   80 mg at 05/03/20 2136  . baclofen (LIORESAL) tablet 20 mg  20 mg Oral BID Melrose Nakayama, MD   20 mg at 05/04/20 0906  . bisacodyl (DULCOLAX) EC tablet 10 mg  10 mg Oral Daily Barrett, Erin R, PA-C   10 mg at 05/04/20 0932   Or  . bisacodyl (DULCOLAX) suppository 10 mg  10 mg Rectal Daily Barrett, Erin R, PA-C      . Chlorhexidine Gluconate Cloth 2 % PADS 6 each  6 each Topical Daily Melrose Nakayama, MD   6 each at 05/03/20 0932  . colchicine tablet 0.6 mg  0.6 mg Oral Daily Melrose Nakayama, MD      . docusate sodium (COLACE) capsule 200 mg  200 mg Oral Daily Barrett, Erin R, PA-C   200 mg at 04/28/20 0927  . Gerhardt's butt cream   Topical BID Melrose Nakayama, MD   1 application at 67/12/45 2136  . insulin aspart (novoLOG) injection 0-9 Units  0-9 Units Subcutaneous TID WC Melrose Nakayama, MD   1 Units at 05/02/20 1635  . lacosamide (VIMPAT) tablet 100 mg  100 mg Oral BID Melrose Nakayama, MD   100 mg at 05/04/20 0905  . lactated ringers infusion   Intravenous Continuous Barrett, Erin R, PA-C      . levETIRAcetam (KEPPRA) tablet 1,500 mg  1,500 mg Oral BID Melrose Nakayama, MD   1,500 mg at 05/04/20 0906  . MEDLINE mouth rinse  15 mL Mouth Rinse BID Melrose Nakayama, MD   15 mL at 05/03/20 0933  . metoprolol succinate (TOPROL-XL) 24 hr tablet 12.5 mg  12.5 mg Oral QHS Larey Dresser, MD   12.5 mg at 05/03/20 2134  . ondansetron (ZOFRAN) injection 4 mg  4 mg Intravenous Q6H PRN Barrett, Erin R, PA-C   4 mg at 04/27/20 0517  . oxyCODONE (Oxy IR/ROXICODONE) immediate release tablet 5-10 mg  5-10 mg Oral Q3H PRN Barrett, Erin R, PA-C      . pantoprazole (PROTONIX) EC tablet 40 mg  40 mg  Oral Daily Melrose Nakayama, MD   40 mg at 05/04/20 0906  . polyethylene glycol (MIRALAX / GLYCOLAX) packet 17 g  17 g Oral Daily PRN Barrett, Erin R, PA-C      . potassium chloride (KLOR-CON) packet 20 mEq  20 mEq Oral Daily Melrose Nakayama, MD   20 mEq at 05/04/20 0907  . sacubitril-valsartan (ENTRESTO) 24-26 mg per tablet  1 tablet Oral BID Bensimhon, Shaune Pascal, MD   1 tablet at 05/04/20 0906  . sodium chloride flush (NS) 0.9 % injection 3 mL  3 mL Intravenous Q12H Barrett, Erin R, PA-C   3 mL at 05/03/20 2137  . sodium chloride flush (NS) 0.9 % injection 3  mL  3 mL Intravenous PRN Barrett, Erin R, PA-C      . spironolactone (ALDACTONE) tablet 12.5 mg  12.5 mg Oral Daily Bensimhon, Shaune Pascal, MD   12.5 mg at 05/04/20 0905  . traMADol (ULTRAM) tablet 50-100 mg  50-100 mg Oral Q4H PRN Barrett, Erin R, PA-C   50 mg at 05/04/20 0915  . warfarin (COUMADIN) tablet 2.5 mg  2.5 mg Oral q1600 Melrose Nakayama, MD   2.5 mg at 05/03/20 1643  . Warfarin - Pharmacist Dosing Inpatient   Does not apply q1600 Melrose Nakayama, MD   Given at 05/03/20 1600     Discharge Medications: Please see discharge summary for a list of discharge medications.  Relevant Imaging Results:  Relevant Lab Results:   Additional Information [phone]; pt non verbal  Sandrika Schwinn Renold Don, LCSWA

## 2020-05-05 DIAGNOSIS — I69391 Dysphagia following cerebral infarction: Secondary | ICD-10-CM | POA: Diagnosis not present

## 2020-05-05 DIAGNOSIS — Z87891 Personal history of nicotine dependence: Secondary | ICD-10-CM | POA: Diagnosis not present

## 2020-05-05 DIAGNOSIS — Z7982 Long term (current) use of aspirin: Secondary | ICD-10-CM | POA: Diagnosis not present

## 2020-05-05 DIAGNOSIS — G40909 Epilepsy, unspecified, not intractable, without status epilepticus: Secondary | ICD-10-CM | POA: Diagnosis not present

## 2020-05-05 DIAGNOSIS — M6281 Muscle weakness (generalized): Secondary | ICD-10-CM | POA: Diagnosis not present

## 2020-05-05 DIAGNOSIS — R4701 Aphasia: Secondary | ICD-10-CM | POA: Diagnosis not present

## 2020-05-05 DIAGNOSIS — Z20822 Contact with and (suspected) exposure to covid-19: Secondary | ICD-10-CM | POA: Diagnosis not present

## 2020-05-05 DIAGNOSIS — I2510 Atherosclerotic heart disease of native coronary artery without angina pectoris: Secondary | ICD-10-CM | POA: Diagnosis not present

## 2020-05-05 DIAGNOSIS — M2550 Pain in unspecified joint: Secondary | ICD-10-CM | POA: Diagnosis not present

## 2020-05-05 DIAGNOSIS — Z951 Presence of aortocoronary bypass graft: Secondary | ICD-10-CM | POA: Diagnosis not present

## 2020-05-05 DIAGNOSIS — I69320 Aphasia following cerebral infarction: Secondary | ICD-10-CM | POA: Diagnosis not present

## 2020-05-05 DIAGNOSIS — Z7901 Long term (current) use of anticoagulants: Secondary | ICD-10-CM | POA: Diagnosis not present

## 2020-05-05 DIAGNOSIS — Z79899 Other long term (current) drug therapy: Secondary | ICD-10-CM | POA: Diagnosis not present

## 2020-05-05 DIAGNOSIS — J189 Pneumonia, unspecified organism: Secondary | ICD-10-CM | POA: Diagnosis not present

## 2020-05-05 DIAGNOSIS — R2681 Unsteadiness on feet: Secondary | ICD-10-CM | POA: Diagnosis not present

## 2020-05-05 DIAGNOSIS — R262 Difficulty in walking, not elsewhere classified: Secondary | ICD-10-CM | POA: Diagnosis not present

## 2020-05-05 DIAGNOSIS — G459 Transient cerebral ischemic attack, unspecified: Secondary | ICD-10-CM | POA: Diagnosis not present

## 2020-05-05 DIAGNOSIS — I69351 Hemiplegia and hemiparesis following cerebral infarction affecting right dominant side: Secondary | ICD-10-CM | POA: Diagnosis not present

## 2020-05-05 DIAGNOSIS — D62 Acute posthemorrhagic anemia: Secondary | ICD-10-CM | POA: Diagnosis not present

## 2020-05-05 DIAGNOSIS — R1311 Dysphagia, oral phase: Secondary | ICD-10-CM | POA: Diagnosis not present

## 2020-05-05 DIAGNOSIS — R4189 Other symptoms and signs involving cognitive functions and awareness: Secondary | ICD-10-CM | POA: Diagnosis not present

## 2020-05-05 DIAGNOSIS — R29818 Other symptoms and signs involving the nervous system: Secondary | ICD-10-CM | POA: Diagnosis not present

## 2020-05-05 DIAGNOSIS — Z8673 Personal history of transient ischemic attack (TIA), and cerebral infarction without residual deficits: Secondary | ICD-10-CM | POA: Diagnosis not present

## 2020-05-05 DIAGNOSIS — Z7401 Bed confinement status: Secondary | ICD-10-CM | POA: Diagnosis not present

## 2020-05-05 DIAGNOSIS — I442 Atrioventricular block, complete: Secondary | ICD-10-CM | POA: Diagnosis not present

## 2020-05-05 DIAGNOSIS — I255 Ischemic cardiomyopathy: Secondary | ICD-10-CM | POA: Diagnosis not present

## 2020-05-05 DIAGNOSIS — J069 Acute upper respiratory infection, unspecified: Secondary | ICD-10-CM | POA: Diagnosis not present

## 2020-05-05 DIAGNOSIS — I5022 Chronic systolic (congestive) heart failure: Secondary | ICD-10-CM | POA: Diagnosis not present

## 2020-05-05 DIAGNOSIS — Z8679 Personal history of other diseases of the circulatory system: Secondary | ICD-10-CM | POA: Diagnosis not present

## 2020-05-05 DIAGNOSIS — I5042 Chronic combined systolic (congestive) and diastolic (congestive) heart failure: Secondary | ICD-10-CM | POA: Diagnosis not present

## 2020-05-05 LAB — CBC
HCT: 28.5 % — ABNORMAL LOW (ref 36.0–46.0)
Hemoglobin: 9.4 g/dL — ABNORMAL LOW (ref 12.0–15.0)
MCH: 29.2 pg (ref 26.0–34.0)
MCHC: 33 g/dL (ref 30.0–36.0)
MCV: 88.5 fL (ref 80.0–100.0)
Platelets: 533 10*3/uL — ABNORMAL HIGH (ref 150–400)
RBC: 3.22 MIL/uL — ABNORMAL LOW (ref 3.87–5.11)
RDW: 14.6 % (ref 11.5–15.5)
WBC: 14 10*3/uL — ABNORMAL HIGH (ref 4.0–10.5)
nRBC: 0 % (ref 0.0–0.2)

## 2020-05-05 LAB — PROTIME-INR
INR: 2 — ABNORMAL HIGH (ref 0.8–1.2)
Prothrombin Time: 22.6 seconds — ABNORMAL HIGH (ref 11.4–15.2)

## 2020-05-05 LAB — URIC ACID: Uric Acid, Serum: 3.8 mg/dL (ref 2.5–7.1)

## 2020-05-05 LAB — GLUCOSE, CAPILLARY
Glucose-Capillary: 109 mg/dL — ABNORMAL HIGH (ref 70–99)
Glucose-Capillary: 92 mg/dL (ref 70–99)
Glucose-Capillary: 107 mg/dL — ABNORMAL HIGH (ref 70–99)

## 2020-05-05 LAB — SARS CORONAVIRUS 2 (TAT 6-24 HRS): SARS Coronavirus 2: NEGATIVE

## 2020-05-05 MED ORDER — ASPIRIN 81 MG PO TBEC: 81.0000 mg | DELAYED_RELEASE_TABLET | Freq: Every day | ORAL | 11 refills | Status: DC

## 2020-05-05 MED ORDER — ACETAMINOPHEN 325 MG PO TABS: 325.0000 mg | ORAL_TABLET | ORAL | Status: DC | PRN

## 2020-05-05 MED ORDER — METOPROLOL SUCCINATE ER 25 MG PO TB24: 12.5000 mg | ORAL_TABLET | Freq: Every day | ORAL | 1 refills | Status: DC

## 2020-05-05 MED ORDER — WARFARIN SODIUM 4 MG PO TABS: 4.0000 mg | ORAL_TABLET | Freq: Once | ORAL | 1 refills | Status: DC

## 2020-05-05 MED ORDER — TRAMADOL HCL 50 MG PO TABS: 50.0000 mg | ORAL_TABLET | Freq: Four times a day (QID) | ORAL | 0 refills | Status: DC | PRN

## 2020-05-05 MED ORDER — COLCHICINE 0.6 MG PO TABS: 0.6000 mg | ORAL_TABLET | Freq: Every day | ORAL | 0 refills | Status: DC

## 2020-05-05 MED ORDER — WARFARIN SODIUM 4 MG PO TABS
4.0000 mg | ORAL_TABLET | Freq: Once | ORAL | Status: AC
  Administered 2020-05-05: 4 mg via ORAL
  Filled 2020-05-05: qty 1

## 2020-05-05 NOTE — Progress Notes (Addendum)
      [address]       [place] [address]             [phone]      6 Days Post-Op Procedure(s) (LRB): BIV PACEMAKER INSERTION CRT-P (N/A) Subjective: Feels okay this morning, no complaints  Objective: Vital signs in last 24 hours: Temp:  [97.9 F (36.6 C)-98.8 F (37.1 C)] 97.9 F (36.6 C) (04/14 0349) Pulse Rate:  [95-101] 95 (04/14 0349) Cardiac Rhythm: Ventricular paced (04/14 0000) Resp:  [19-22] 20 (04/14 0349) BP: (92-114)/(59-66) 96/63 (04/14 0349) SpO2:  [96 %-97 %] 96 % (04/14 0349) Weight:  [56.7 kg] 56.7 kg (04/14 0458)     Intake/Output from previous day: 04/13 0701 - 04/14 0700 In: 245 [P.O.:240; I.V.:5] Out: 850 [Urine:850] Intake/Output this shift: No intake/output data recorded.  General appearance: alert, cooperative and no distress Heart: regular rate and rhythm, S1, S2 normal, no murmur, click, rub or gallop Lungs: clear to auscultation bilaterally Abdomen: soft, non-tender; bowel sounds normal; no masses,  no organomegaly Extremities: extremities normal, atraumatic, no cyanosis or edema Wound: clean and dry  Lab Results: Recent Labs    05/04/20 0232 05/05/20 0031  WBC 12.6* 14.0*  HGB 9.4* 9.4*  HCT 28.2* 28.5*  PLT 429* 533*   BMET:  Recent Labs    05/03/20 0111 05/04/20 0232  NA 137 136  K 3.5 3.9  CL 101 105  CO2 24 24  GLUCOSE 115* 100*  BUN 16 11  CREATININE 0.94 0.83  CALCIUM 8.6* 8.5*    PT/INR:  Recent Labs    05/05/20 0031  LABPROT 22.6*  INR 2.0*   ABG    Component Value Date/Time   PHART 7.378 04/26/2020 0934   HCO3 21.9 04/26/2020 0934   TCO2 23 04/26/2020 0934   ACIDBASEDEF 3.0 (H) 04/26/2020 0934   O2SAT 59.5 04/28/2020 0436   CBG (last 3)  Recent Labs    05/04/20 1607 05/04/20 2137 05/05/20 0648  GLUCAP 114* 124* 109*    Assessment/Plan: S/P Procedure(s) (LRB): BIV PACEMAKER INSERTION CRT-P (N/A)  1. CV-PPM placed, stable AV paced rhythm, hypotensive at times but  asymptomatic, HF following and titratingmedications 2. Pulm-tolerating room air with good oxygen saturation 3. Renal- creatinine0.94,potassium improved this morning 4. H and H 9.4/28.5, stable  5. Endo-blood glucose well controlled 6. Embolic CVA (remote [id number] has been restarted. INR 2.0  Plan: Ready for SNF transfer, working on bed placement. Will order a COVID test today for hopeful placement tomorrow.    LOS: 14 days    Elgie Collard 05/05/2020 Patient seen and examined, agree with above R wrist and ankle pain improved OK for dc to SNF when bed available  Remo Lipps C. Roxan Hockey, MD [facility] [phone]

## 2020-05-05 NOTE — TOC Progression Note (Signed)
Transition of Care (TOC) - Progression Note  Heart Failure   Patient Details  Name: [name] MRN: [id number] Date of Birth: 1950-06-29  Transition of [facility]) CM/SW [place], [place] Phone Number: 05/05/2020, 2:46 PM  Clinical Narrative:    CSW attempted to contact [place] [phone] to ask what room [name] would be in however CSW had to leave a voicemail as nobody answered the phone. CSW contacted the patients husband [name] [name] Sr. [phone] to discuss about transportation upon patients discharge and he was unsure and stated he would call me back.  TOC to continue to follow for discharge needs.    Expected Discharge Plan: [place] Barriers to Discharge: Continued Medical Work up  Expected Discharge Plan and Services Expected Discharge Plan: [place] In-house Referral: Clinical Social Work     Living arrangements for the past 2 months: Single Family Home                                       Social Determinants of Health (SDOH) Interventions    Readmission Risk Interventions No flowsheet data found.  Disa Riedlinger, MSW, [place] Heart Failure Social Worker

## 2020-05-05 NOTE — TOC Transition Note (Addendum)
Transition of Care [facility]) - CM/SW Discharge Note Heart Failure   Patient Details  Name: [name] MRN: [id number] Date of Birth: [birth year]  Transition of Care [facility]) CM/SW Contact:  [place], [place] Phone Number: 05/05/2020, 3:31 PM   Clinical Narrative:    Patient will DC to: [facility] Anticipated DC date: 05/05/2020 Family notified: Spouse [name] Sr. [phone] Transport by:   Per MD patient ready for DC to SNF [facility]. RN to call report prior to discharge [phone] RM#[phone]). RN, patient, patient's family, and facility notified of DC. Discharge Summary and FL2 sent to facility. DC packet on chart. Ambulance transport requested for patient. Estimated wait time 3+ hours for transport and RN and patients spouse notified.  CSW will sign off for now as social work intervention is no longer needed. Please consult [place] again if new needs arise.     Final next level of care: Skilled Nursing Facility Barriers to Discharge: No Barriers Identified   Patient Goals and CMS Choice Patient states their goals for this hospitalization and ongoing recovery are:: to go to SNF CMS Medicare.gov Compare Post Acute Care list provided to:: Patient Choice offered to / list presented to : Patient  Discharge Placement PASRR number recieved: 05/05/20 Existing PASRR number confirmed : 05/05/20          Patient chooses bed at: [facility] Patient to be transferred to facility by: [place] Name of family member notified: Spouse [name] Sr. [phone] Patient and family notified of of transfer: 05/05/20  Discharge Plan and Services In-house Referral: Clinical Social Work                                   Social Determinants of Health ([place]) Interventions     Readmission Risk Interventions No flowsheet data found.   [name], MSW, [place] Heart Failure Social Worker

## 2020-05-05 NOTE — Progress Notes (Addendum)
[place] for Warfarin  Indication: stroke  Allergies  Allergen Reactions  . Lexiscan [Regadenoson] Other (See Comments)    Perioral tingling and throat tightness    Patient Measurements: Height: 5\' 7"  (170.2 cm) Weight: 56.7 kg (125 lb) IBW/kg (Calculated) : 61.6 Heparin Dosing Weight: 57 kg  Vital Signs: Temp: 97.9 F (36.6 C) (04/14 0349) Temp Source: Oral (04/14 0349) BP: 96/63 (04/14 0349) Pulse Rate: 95 (04/14 0349)  Labs: Recent Labs    05/03/20 0111 05/04/20 0232 05/05/20 0031  HGB 9.6* 9.4* 9.4*  HCT 29.0* 28.2* 28.5*  PLT 356 429* 533*  LABPROT 20.1* 22.4* 22.6*  INR 1.8* 2.0* 2.0*  CREATININE 0.94 0.83  --     Estimated Creatinine Clearance: 55.6 mL/min (by C-G formula based on SCr of 0.83 mg/dL).   Medical History: Past Medical History:  Diagnosis Date  . Adenomatous polyp of colon 09/2012   repeat colonoscopy in 5 years by Dr. Sharlett Iles  . CHF (congestive heart failure) (HCC)    EF 15-20% as of 05/28/11 (Dr [facility] summary)  . CVA (cerebral infarction) 12/2007  . Hemiparesis ([place])   . Hyperlipidemia   . Hypertension   . Seizures ([place])   . Sickle cell anemia (HCC)   . Stroke ([place])   . Vitamin D deficiency 2010    Assessment: 70 yo female presents with CHB now s/p temporary pacer. On warfarin PTA for hx embolic stroke, no known hx of afib. INR on admit therapeutic at 2.2. LHC on 4/1 shows LM-LCx disease, PCI held for CABG consult. Pharmacy asked start heparin 8 hr after sheath pull and when INR <2.   PTA warfarin regimen: 5 mg daily except 7.5 mg Mon/Thurs INR on admit: 2.2  INR = 2 at goal. increasing slowly.  Titrating slowly given CABG and PPM No overt bleeding or complications noted.  CBC stable.  Goal of Therapy:  INR 2-3 Monitor platelets by anticoagulation protocol: Yes   Plan:  Warfarin 4 mg x 1 tonight Daily INR. If she goes to SNF today, would recommend 4 mg daily  until next INR check at SNF.  [place] Crane, Roylene Reason, BCCP Clinical Pharmacist  05/05/2020 9:53 AM   [facility] pharmacy phone numbers are listed on [email or web address]

## 2020-05-05 NOTE — Progress Notes (Signed)
Physical Therapy Treatment Patient Details Name: [name] [name] MRN: [id number] DOB: [birth year] Today's Date: 05/05/2020    History of Present Illness 70 y.o. female admitted 3/31 after syncope at home and found to have complete heart block. Pt s/p CABG x 2 4/4. PPM placed 04/29/2020. PMhx: HTN, HLD, stroke (expressive aphasia and R sided deficit), sickle cell anemia, seizure d/o, chronic HF (systolic).    PT Comments    Pt progressing very slowly toward goals, limited by precautions.  Emphasis on transitions, sitting balance, scooting to prep for transfers, sit to standing and face to face transfers.   Follow Up Recommendations  SNF;Supervision/Assistance - 24 hour     Equipment Recommendations  3in1 (PT);Hospital bed    Recommendations for Other Services       Precautions / Restrictions Precautions Precautions: Sternal;Fall;ICD/Pacemaker Precaution Comments: aphasia, right hemiparesis with contracted RUE, foot drop and extension of RLE Required Braces or Orthoses: Other Brace Other Brace: R wrist cock up splint    Mobility  Bed Mobility Overal bed mobility: Needs Assistance Bed Mobility: Supine to Sit     Supine to sit: Mod assist     General bed mobility comments: pt scoots toward EOB utilizing low height bridging, needs truncal assist to come forward.  Pushes with L UE to scoot forward against cues to not do so, but pt is unable to build momentum with her trunk to accomplish as scoot otherwise.    Transfers Overall transfer level: Needs assistance   Transfers: Sit to/from Stand;Stand Pivot Transfers Sit to Stand: Max assist Stand pivot transfers: Max assist;+2 physical assistance       General transfer comment: assisted to prep for transfer.  Came to standing with face to face assist.  Completed a transfer to the chair via stand pivot to the strong side  Ambulation/Gait             General Gait Details: unable   Stairs              Wheelchair Mobility    Modified Rankin (Stroke Patients Only)       Balance Overall balance assessment: Needs assistance Sitting-balance support: No upper extremity supported;Feet supported Sitting balance-Leahy Scale: Poor Sitting balance - Comments: posterior lean, minA   Standing balance support: Single extremity supported Standing balance-Leahy Scale: Poor Standing balance comment: modA/max posterior lean                            Cognition Arousal/Alertness: Awake/alert Behavior During Therapy: Flat affect Overall Cognitive Status: Difficult to assess                                 General Comments: uses gestures, head nod and facial expression to communicate needs with difficulty      Exercises      General Comments General comments (skin integrity, edema, etc.): vss external pacing      Pertinent Vitals/Pain Pain Assessment: Faces Faces Pain Scale: Hurts a little bit Pain Location: pt points to RLE and RUE when asked about pain, nods yes when asked if these areas hurt Pain Descriptors / Indicators: Guarding Pain Intervention(s): Monitored during session;Limited activity within patient's tolerance    Home Living                      Prior Function  PT Goals (current goals can now be found in the care plan section) Acute Rehab PT Goals Patient Stated Goal: get home PT Goal Formulation: With patient Time For Goal Achievement: 05/11/20 Potential to Achieve Goals: Fair Progress towards PT goals: Progressing toward goals (slow progress)    Frequency    Min 2X/week      PT Plan Current plan remains appropriate    Co-evaluation              AM-PAC PT "6 Clicks" Mobility   Outcome Measure  Help needed turning from your back to your side while in a flat bed without using bedrails?: A Lot Help needed moving from lying on your back to sitting on the side of a flat bed without using bedrails?: A  Lot Help needed moving to and from a bed to a chair (including a wheelchair)?: A Lot Help needed standing up from a chair using your arms (e.g., wheelchair or bedside chair)?: A Lot Help needed to walk in hospital room?: Total Help needed climbing 3-5 steps with a railing? : Total 6 Click Score: 10    End of Session   Activity Tolerance: Patient tolerated treatment well Patient left: in chair;with call bell/phone within reach;with chair alarm set Nurse Communication: Mobility status PT Visit Diagnosis: Other abnormalities of gait and mobility (R26.89);Difficulty in walking, not elsewhere classified (R26.2);Hemiplegia and hemiparesis Hemiplegia - Right/Left: Right Hemiplegia - caused by: Cerebral infarction     Time: 5038-8828 PT Time Calculation (min) (ACUTE ONLY): 30 min  Charges:  $Therapeutic Activity: 23-37 mins                     05/05/2020  Valerie Carne., PT Acute Rehabilitation Services [phone]  (pager) [phone]  (office)   [name] 05/05/2020, 4:47 PM

## 2020-05-05 NOTE — TOC Progression Note (Addendum)
Transition of Care [facility]) - Progression Note    Patient Details  Name: [name] [place] MRN: [id number] Date of Birth: 01-24-1950  [facility] CM/SW Contact  Reece Agar, [place] Phone Number: 05/05/2020, 11:37 AM  Clinical Narrative:    11:35am- Steffanie Dunn at [facility] contacted CSW to update about insurance has been approved. CSW let Steffanie Dunn know [place] (CSW) will be giving her a call to complete transition of pt.   Expected Discharge Plan: [place] Barriers to Discharge: Continued Medical Work up  Expected Discharge Plan and Services Expected Discharge Plan: [place] In-house Referral: Clinical Social Work     Living arrangements for the past 2 months: Single Family Home                                       Social Determinants of Health (SDOH) Interventions    Readmission Risk Interventions No flowsheet data found.

## 2020-05-05 NOTE — Plan of Care (Signed)
  Problem: Education: Goal: Knowledge of General Education information will improve Description: Including pain rating scale, medication(s)/side effects and non-pharmacologic comfort measures Outcome: Progressing   Problem: Health Behavior/Discharge Planning: Goal: Ability to manage health-related needs will improve Outcome: Progressing   Problem: Clinical Measurements: Goal: Ability to maintain clinical measurements within normal limits will improve Outcome: Progressing Goal: Will remain free from infection Outcome: Progressing Goal: Diagnostic test results will improve Outcome: Progressing Goal: Respiratory complications will improve Outcome: Progressing Goal: Cardiovascular complication will be avoided Outcome: Progressing   Problem: Activity: Goal: Risk for activity intolerance will decrease Outcome: Progressing   Problem: Nutrition: Goal: Adequate nutrition will be maintained Outcome: Progressing   Problem: Coping: Goal: Level of anxiety will decrease Outcome: Progressing   Problem: Elimination: Goal: Will not experience complications related to bowel motility Outcome: Progressing Goal: Will not experience complications related to urinary retention Outcome: Progressing   Problem: Pain Managment: Goal: General experience of comfort will improve Outcome: Progressing   Problem: Safety: Goal: Ability to remain free from injury will improve Outcome: Progressing   Problem: Skin Integrity: Goal: Risk for impaired skin integrity will decrease Outcome: Progressing   Problem: Education: Goal: Understanding of CV disease, CV risk reduction, and recovery process will improve Outcome: Progressing   Problem: Activity: Goal: Ability to return to baseline activity level will improve Outcome: Progressing   Problem: Cardiovascular: Goal: Ability to achieve and maintain adequate cardiovascular perfusion will improve Outcome: Progressing Goal: Vascular access site(s)  Level 0-1 will be maintained Outcome: Progressing   Problem: Health Behavior/Discharge Planning: Goal: Ability to safely manage health-related needs after discharge will improve Outcome: Progressing   Problem: Education: Goal: Understanding of CV disease, CV risk reduction, and recovery process will improve Outcome: Progressing Goal: Individualized Educational Video(s) Outcome: Progressing   Problem: Activity: Goal: Ability to return to baseline activity level will improve Outcome: Progressing   Problem: Cardiovascular: Goal: Ability to achieve and maintain adequate cardiovascular perfusion will improve Outcome: Progressing Goal: Vascular access site(s) Level 0-1 will be maintained Outcome: Progressing   Problem: Health Behavior/Discharge Planning: Goal: Ability to safely manage health-related needs after discharge will improve Outcome: Progressing

## 2020-05-05 NOTE — Progress Notes (Signed)
Occupational Therapy Treatment Patient Details Name: [name] [place] MRN: [id number] DOB: November 06, 1950 Today's Date: 05/05/2020    History of present illness 70 y.o. female admitted 3/31 after syncope at home and found to have complete heart block. Pt s/p CABG x 2 4/4. PPM placed 04/29/2020. PMhx: HTN, HLD, stroke (expressive aphasia and R sided deficit), sickle cell anemia, seizure d/o, chronic HF (systolic).   OT comments  Pt participated in grooming seated at EOB. Mod assist for bed mobility, min assist for sitting balance. Pt declined OOB to chair for lunch, despite maximum encouragement. Returned to supine and set up tray for self feeding with pt positioned upright in bed.   Follow Up Recommendations  SNF    Equipment Recommendations       Recommendations for Other Services      Precautions / Restrictions Precautions Precautions: Sternal;Fall;ICD/Pacemaker Precaution Booklet Issued: No Precaution Comments: aphasia, right hemiparesis with contracted RUE, foot drop and extension of RLE Required Braces or Orthoses: Other Brace Other Brace: R wrist cock up splint       Mobility Bed Mobility Overal bed mobility: Needs Assistance Bed Mobility: Supine to Sit;Sit to Supine     Supine to sit: Mod assist Sit to supine: Mod assist        Transfers                 General transfer comment: pt declined OOB to chair despite maximum encouragement    Balance Overall balance assessment: Needs assistance Sitting-balance support: No upper extremity supported;Feet supported Sitting balance-Leahy Scale: Poor Sitting balance - Comments: posterior lean, minA                                   ADL either performed or assessed with clinical judgement   ADL Overall ADL's : Needs assistance/impaired Eating/Feeding: Minimal assistance;Bed level   Grooming: Wash/dry hands;Sitting;Oral care;Moderate assistance               Lower Body Dressing: Total  assistance;Bed level                       Vision       Perception     Praxis      Cognition Arousal/Alertness: Awake/alert Behavior During Therapy: Flat affect Overall Cognitive Status: Difficult to assess                                 General Comments: uses gestures, head nod and facial expression to communicate needs        Exercises     Shoulder Instructions       General Comments      Pertinent Vitals/ Pain       Pain Assessment: Faces Faces Pain Scale: No hurt  Home Living                                          Prior Functioning/Environment              Frequency  Min 2X/week        Progress Toward Goals  OT Goals(current goals can now be found in the care plan section)  Progress towards OT goals: Progressing toward goals  Acute Rehab OT Goals Patient Stated  Goal: to eat her lunch OT Goal Formulation: With patient Time For Goal Achievement: 05/11/20 Potential to Achieve Goals: [place]  Plan Discharge plan remains appropriate    Co-evaluation                 AM-PAC OT "6 Clicks" Daily Activity     Outcome Measure   Help from another person eating meals?: A Little Help from another person taking care of personal grooming?: A Lot Help from another person toileting, which includes using toliet, bedpan, or urinal?: A Lot Help from another person bathing (including washing, rinsing, drying)?: A Lot Help from another person to put on and taking off regular upper body clothing?: A Lot Help from another person to put on and taking off regular lower body clothing?: Total 6 Click Score: 12    End of Session    OT Visit Diagnosis: Unsteadiness on feet (R26.81);Muscle weakness (generalized) (M62.81);Other abnormalities of gait and mobility (R26.89);Hemiplegia and hemiparesis Hemiplegia - Right/Left: Right Hemiplegia - dominant/non-dominant: Dominant Hemiplegia - caused by: Cerebral infarction    Activity Tolerance Patient tolerated treatment well   Patient Left with call bell/phone within reach;in bed;with bed alarm set   Nurse Communication          Time: 3403-5248 OT Time Calculation (min): 21 min  Charges: OT General Charges $OT Visit: 1 Visit OT Treatments $Self Care/Home Management : 8-22 mins  Nestor Lewandowsky, OTR/L Acute Rehabilitation Services Pager: [phone] Office: [phone]   Malka So 05/05/2020, 12:33 PM

## 2020-05-05 NOTE — Progress Notes (Signed)
Patient ID: [name], female   DOB: Aug 09, 1950, 70 y.o.   MRN: [id number]  No change to plan as outlined yesterday.  Awaiting SNF, stable for discharge from my standpoint when bed available.   Loralie Champagne 05/05/2020

## 2020-05-05 NOTE — Progress Notes (Signed)
Pt discharged to [facility] via [facility].  Report called to [facility] by S. Theresia Lo, spoke with Cassandra.  NSL discontinued.  Site unremarkable.  HS medications given.  Pt belongings sent with patient.  Discharge packet with AVS given to [facility] staff.

## 2020-05-09 ENCOUNTER — Encounter: Payer: Self-pay | Admitting: Adult Health

## 2020-05-09 ENCOUNTER — Telehealth: Payer: Self-pay

## 2020-05-09 ENCOUNTER — Non-Acute Institutional Stay (SKILLED_NURSING_FACILITY): Payer: Medicare HMO | Admitting: Adult Health

## 2020-05-09 DIAGNOSIS — G40909 Epilepsy, unspecified, not intractable, without status epilepticus: Secondary | ICD-10-CM

## 2020-05-09 DIAGNOSIS — I442 Atrioventricular block, complete: Secondary | ICD-10-CM | POA: Diagnosis not present

## 2020-05-09 DIAGNOSIS — I5022 Chronic systolic (congestive) heart failure: Secondary | ICD-10-CM | POA: Diagnosis not present

## 2020-05-09 DIAGNOSIS — Z8673 Personal history of transient ischemic attack (TIA), and cerebral infarction without residual deficits: Secondary | ICD-10-CM

## 2020-05-09 DIAGNOSIS — I2510 Atherosclerotic heart disease of native coronary artery without angina pectoris: Secondary | ICD-10-CM

## 2020-05-09 NOTE — Telephone Encounter (Signed)
Pt spouse reports she is currently at [facility] for rehab, he is unsure of whether patient can make it to wound check.    Spoke with Nurse, Danae Chen at [facility] to determine if they can do wound check with Video Visit or transport her here for in-person visit.  They will arrange transportation but are unable to make that happen for tomorrow.  Rescheduled appt to 05/12/20 at [facility] with pt spouse advised of new time/ date for appt.  He will look for [employer] monitor when he visits pt.

## 2020-05-09 NOTE — Progress Notes (Signed)
Location:  [place] Room Number: 213/A Place of Service:  SNF (31) Provider:  Durenda Age, DNP, FNP-BC  Patient Care Team: Just, [name], FNP as PCP - General (Family Medicine) Skeet Latch, MD as PCP - Cardiology (Cardiology) Adrian Prows, MD as Attending Physician (Cardiology) Burnard Bunting, MD (Internal Medicine)  Extended Emergency Contact Information Primary Emergency Contact: Drach,[name] Address: [address]          [place], [place] [address] [name] of [place] Phone: [phone] Mobile Phone: [phone] Relation: Spouse Secondary Emergency Contact: Justice Deeds          [place], [place] [address] [name] of [place] Phone: [phone] Mobile Phone: [phone] Relation: Son  Code Status:  Full Code  Goals of care: Advanced Directive information Advanced Directives 05/09/2020  Does Patient Have a Medical Advance Directive? No  Type of Advance Directive -  Does patient want to make changes to medical advance directive? -  Copy of [place] in Chart? -  Would patient like information on creating a medical advance directive? No - Patient declined  Pre-existing out of facility DNR order (yellow form or pink MOST form) -     Chief Complaint  Patient presents with  . Hospitalization Follow-up    Hospitalization Follow Up     HPI:  Pt is a 70 y.o. female who was admitted to [facility] on 05/05/20 post [facility] admission 04/21/20 to 05/05/20.  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  She, also, has seizure disorder, hypertension, dyslipidemia, sickle cell anemia, combined diastolic and systolic heart failure.  Myoview stress test in 2020 showed large severe fixed inferior, anterior apical and inferolateral perfusion defects, ejection fraction  was 34%.  She was apparently in their usual state of health 1 day prior to hospitalization when her husband witnessed what he thought was a seizure.  Patient had just gotten dressed to go out with her husband and was putting scar around her neck when he noticed her making gurgling sounds.  She was slammed into a chair but had not lost her consciousness.  EMS was called and the cardiac monitor was placed.  She was found to be in complete heart block and was transported to [facility] ED while being percutaneously placed.  In the ED, a transvenous lead was placed emergently and pacing continued.  Digoxin and carvedilol that she had been taking regularly were held.  The heart failure team was consulted.  Left heart catheterization was recommended and was carried out.  It demonstrated a 70% distal left main coronary artery stenosis along with a 70% stenosis in the proximal circumflex.  Repeat echo showed ejection fraction of around 35%.  There is moderate aortic valve sclerosis without significant stenosis.  The mitral valve had trivial insufficiency, no significant stenosis.  Cardiothoracic surgery was consulted and thought that PCI would be technically difficult she was felt to be high risk for postoperative complications and she would likely have a prolonged recovery due to her previous stroke deficits.  They ultimately wished to proceed with CABG procedure.  She underwent CABG x2 utilizing LIMA to LAD and SVG to OM on 04/25/2020.  She underwent endoscopic harvest of her greater saphenous vein from her right thigh.  Hospitalization was complicated by bibasilar atelectasis on POD 3 for which she was encouraged to use incentive spirometer.  INR on 05/05/2020 was 2.0 and was discharged on Coumadin 4 mg daily for her history of CVA.  INR goal 2-3.  EP decided PPM placement on 04/29/2020.  She developed some gout-like symptoms and was put on colchicine x7 days.  She was seen in the room today.  She is aphasic and has  right hemiplegia.  She was able to follow simple verbal commands.   Past Medical History:  Diagnosis Date  . Adenomatous polyp of colon 09/2012   repeat colonoscopy in 5 years by Dr. Sharlett Iles  . CHF (congestive heart failure) (HCC)    EF 15-20% as of 05/28/11 (Dr Legrand [facility] D/C summary)  . CVA (cerebral infarction) 12/2007  . Hemiparesis ([place])   . Hyperlipidemia   . Hypertension   . Seizures ([place])   . Sickle cell anemia (HCC)   . Stroke ([place])   . Vitamin D deficiency 2010   Past Surgical History:  Procedure Laterality Date  . ABDOMINAL HYSTERECTOMY    . BIV PACEMAKER INSERTION CRT-P N/A 04/29/2020   Procedure: BIV PACEMAKER INSERTION CRT-P;  Surgeon: Constance Haw, MD;  Location: [place] CV LAB;  Service: Cardiovascular;  Laterality: N/A;  . CORONARY ARTERY BYPASS GRAFT N/A 04/25/2020   Procedure: CORONARY ARTERY BYPASS GRAFTING (CABG) TIMES TWO USING LEFT INTERNAL MAMMARY ARTERY AND RIGHT GREATER SAPHENOUS VEIN HARVESTED ENDOSCOPICALLY;  Surgeon: Melrose Nakayama, MD;  Location: [facility];  Service: Open Heart Surgery;  Laterality: N/A;  . FRACTURE SURGERY    . HIP SURGERY    . LEFT HEART CATH AND CORONARY ANGIOGRAPHY N/A 04/22/2020   Procedure: LEFT HEART CATH AND CORONARY ANGIOGRAPHY;  Surgeon: Larey Dresser, MD;  Location: [place] CV LAB;  Service: Cardiovascular;  Laterality: N/A;  . TEE WITHOUT CARDIOVERSION  04/25/2020   Procedure: TRANSESOPHAGEAL ECHOCARDIOGRAM (TEE);  Surgeon: Melrose Nakayama, MD;  Location: [facility];  Service: Open Heart Surgery;;  . TEMPORARY PACEMAKER N/A 04/21/2020   Procedure: TEMPORARY PACEMAKER;  Surgeon: Larey Dresser, MD;  Location: [place] CV LAB;  Service: Cardiovascular;  Laterality: N/A;  . TUBAL LIGATION      Allergies  Allergen Reactions  . Lexiscan [Regadenoson] Other (See Comments)    Perioral tingling and throat tightness    Outpatient Encounter Medications as of 05/09/2020  Medication Sig  .  acetaminophen (TYLENOL) 325 MG tablet Take 1-2 tablets (325-650 mg total) by mouth every 4 (four) hours as needed for mild pain.  Marland Kitchen aspirin EC 81 MG EC tablet Take 1 tablet (81 mg total) by mouth daily. Swallow whole.  Marland Kitchen atorvastatin (LIPITOR) 80 MG tablet TAKE 1 TABLET AT BEDTIME  . baclofen (LIORESAL) 20 MG tablet Take 20 mg by mouth at bedtime as needed for muscle spasms.  . baclofen (LIORESAL) 20 MG tablet Take 20 mg by mouth 3 (three) times daily. For Muscle Spasms  . Cholecalciferol 50 MCG (2000 UT) TABS Take 1 tablet by mouth daily in the afternoon.  . colchicine 0.6 MG tablet Take 1 tablet (0.6 mg total) by mouth daily.  . Lacosamide (VIMPAT) 100 MG TABS Take 1 tablet by mouth 2 (two) times daily. For Seizures  . levETIRAcetam (KEPPRA) 750 MG tablet Take 2 tablets (1,500 mg total) by mouth 2 (two) times daily.  . metoprolol succinate (TOPROL-XL) 25 MG 24 hr tablet Take 0.5 tablets (12.5 mg total) by mouth at bedtime.  . polyethylene glycol (MIRALAX / GLYCOLAX) packet Take 17 g by mouth daily as needed for moderate constipation.  Marland Kitchen  sacubitril-valsartan (ENTRESTO) 24-26 MG Take 1 tablet by mouth 2 (two) times daily.  Marland Kitchen spironolactone (ALDACTONE) 25 MG tablet TAKE 1/2 TABLET EVERY DAY  . traMADol (ULTRAM) 50 MG tablet Take 1 tablet (50 mg total) by mouth every 6 (six) hours as needed for moderate pain.  Marland Kitchen warfarin (COUMADIN) 4 MG tablet Take 4 mg by mouth daily.  . [DISCONTINUED] baclofen (LIORESAL) 20 MG tablet TAKE 1 TABLET THREE TIMES DAILY (MAY TAKE AN ADDITIONAL TABLET MID-DAY OR BEFORE BED AS NEEDED FOR MUSCLE SPASMS)  . [DISCONTINUED] Lacosamide 100 MG TABS Take 1 tablet (100 mg total) by mouth 2 (two) times daily.  . [DISCONTINUED] warfarin (COUMADIN) 4 MG tablet Take 1 tablet (4 mg total) by mouth one time only at 4 PM.   No facility-administered encounter medications on file as of 05/09/2020.    Review of Systems unable to obtain due to aphasia    Immunization History   Administered Date(s) Administered  . Fluad Quad(high Dose 65+) 10/10/2018  . Influenza,inj,Quad PF,6+ Mos 12/12/2012, 10/23/2013, 09/30/2014, 04/05/2016  . PFIZER Comirnaty(Gray Top)Covid-19 Tri-Sucrose Vaccine 05/04/2020  . PFIZER(Purple Top)SARS-COV-2 Vaccination 03/22/2019, 04/15/2019  . Pneumococcal Conjugate-13 09/30/2014  . Pneumococcal Polysaccharide-23 04/05/2016  . Tdap 12/12/2012   Pertinent  Health Maintenance Due  Topic Date Due  . COLONOSCOPY (Pts 45-45yrs Insurance coverage will need to be confirmed)  10/07/2020 (Originally 10/21/2017)  . INFLUENZA VACCINE  08/22/2020  . MAMMOGRAM  08/03/2021  . DEXA SCAN  Completed  . PNA vac Low Risk Adult  Completed   Fall Risk  04/12/2020 03/02/2020 12/14/2019 10/08/2019 04/09/2019  Falls in the past year? 0 0 0 - 0  Number falls in past yr: 0 0 0 0 0  Injury with Fall? 0 0 0 0 0  Risk for fall due to : - - - - Impaired balance/gait;Mental status change  Follow up Falls evaluation completed Falls evaluation completed Falls evaluation completed - -     Vitals:   05/09/20 1602  BP: 119/65  Pulse: 98  Resp: 19  Temp: (!) 97.2 F (36.2 C)  SpO2: 93%  Weight: 123 lb (55.8 kg)  Height: 5\' 4"  (1.626 m)   Body mass index is 21.11 kg/m.  Physical Exam  GENERAL APPEARANCE: Well nourished. In no acute distress. Normal body habitus SKIN:  Vertical midline and left chest surgical site is dry, no redness MOUTH and THROAT: Lips are without lesions. Oral mucosa is moist and without lesions.  RESPIRATORY: Breathing is even & unlabored, BS CTAB CARDIAC: RRR, no murmur,no extra heart sounds GI: Abdomen soft, normal BS, no masses, no tenderness NEUROLOGICAL: There is no tremor.  Aphasic.  Right hemiplegia. PSYCHIATRIC:  Affect and behavior are appropriate  Labs reviewed: Recent Labs    04/25/20 2044 04/26/20 0014 04/26/20 0117 04/26/20 0811 04/26/20 1555 04/27/20 0359 05/02/20 0040 05/03/20 0111 05/04/20 0232  NA 136   < > 136    < > 137   < > 138 137 136  K 3.7   < > 4.1   < > 4.3   < > 4.0 3.5 3.9  CL 107  --  105  --  106   < > 104 101 105  CO2 22  --  22  --  25   < > 25 24 24   GLUCOSE 110*  --  170*  --  139*   < > 111* 115* 100*  BUN 12  --  13  --  13   < >  14 16 11   CREATININE 0.78  --  0.90  --  1.06*   < > 1.00 0.94 0.83  CALCIUM 8.5*  --  8.7*  --  8.2*   < > 8.6* 8.6* 8.5*  MG 2.7*  --  2.9*  --  2.2  --   --   --   --    < > = values in this interval not displayed.   Recent Labs    10/08/19 1031 12/14/19 1020 04/21/20 0825  AST 24 24 28   ALT 13 9 15   ALKPHOS 161* 156* 95  BILITOT <0.2 0.3 0.4  PROT 7.3 7.5 6.5  ALBUMIN 4.2 4.3 3.3*   Recent Labs    03/02/20 1221 04/21/20 0825 04/21/20 0832 05/03/20 0111 05/04/20 0232 05/05/20 0031  WBC 6.8 9.2   < > 14.7* 12.6* 14.0*  NEUTROABS 4.0 1.7  --   --   --   --   HGB 12.8 11.1*   < > 9.6* 9.4* 9.4*  HCT 38.5 37.0   < > 29.0* 28.2* 28.5*  MCV 85 95.9   < > 87.3 87.6 88.5  PLT 303 322   < > 356 429* 533*   < > = values in this interval not displayed.   Lab Results  Component Value Date   TSH 1.846 10/23/2013   Lab Results  Component Value Date   HGBA1C 6.1 (H) 04/22/2020   Lab Results  Component Value Date   CHOL 124 04/22/2020   HDL 41 04/22/2020   LDLCALC 64 04/22/2020   TRIG 94 04/22/2020   CHOLHDL 3.0 04/22/2020    Significant Diagnostic Results in last 30 days:  DG Chest 2 View  Result Date: 04/30/2020 CLINICAL DATA:  Pacemaker placement EXAM: CHEST - 2 VIEW COMPARISON:  April 29, 2020 FINDINGS: Pacemaker present on the left. Pacemaker leads are attached to the right atrium, right ventricle, and coronary sinus. Temporary pacemaker wires are also attached to the right heart. No evident pneumothorax. Heart is mildly enlarged with pulmonary vascularity normal. Patient is status post coronary artery bypass grafting. There is a left pleural effusion consolidation in portions of the lingula and left lower lobe. Minimal right  pleural effusion. Mild right base atelectasis. There is aortic atherosclerosis. No bone lesions. IMPRESSION: Pacemaker leads as described. No pneumothorax. Mild cardiomegaly. Left pleural effusion with consolidation in portions of the left lower lobe and lingula. Suspect combination of atelectasis and pneumonia. Minimal right pleural effusion with right base atelectasis. Right lung otherwise clear. Aortic Atherosclerosis (ICD10-I70.0). Electronically Signed   By: Lowella Grip III M.D.   On: 04/30/2020 08:46   CARDIAC CATHETERIZATION  Result Date: 04/22/2020  Dist LM lesion is 70% stenosed.  Ost Cx lesion is 70% stenosed.  Prox Cx lesion is 99% stenosed.  1. LVEDP 7. 2. 60-70% distal left main stenosis. 3. Heavily calcified proximal LCx.  60-70% stenosis at the ostium with discrete 99% stenosis in the proximal LCx.  I suspect that her CHB is related to chronic ischemia from LM-LCx disease.  Difficult situation.  She has surgical disease but seems to be a poor candidate for CABG with weakness and aphasia from prior stroke though per her husband, she does all ADLs and walks with cane at home.  PCI would be possible but high risk/difficult heavy calcification and tortuousity.  I discussed the films with Dr. [place].  Will ask surgery to see for an opinion.  If they turn her down for CABG, would plan high  risk PCI from LM into the proximal LCx on Monday or Tuesday.  Will need to keep TTVP in place.   CARDIAC CATHETERIZATION  Result Date: 04/21/2020 Successful temporary transvenous pacemaker placement from right femoral vein.  Left at 80 bpm, 8 mA.  Threshold 3.5 mA.   EP PPM/ICD IMPLANT  Result Date: 04/29/2020 SURGEON: Allegra Lai, MD PREPROCEDURE DIAGNOSES: 1. Ischemic cardiomyopathy. 2. New York Heart Association class II, heart failure chronically. 3. Left bundle-branch block. POSTPROCEDURE DIAGNOSES: 1. Ischemic cardiomyopathy. 2. New York Heart Association class II heart failure chronically. 3.  Left bundle-branch block. PROCEDURES: 1. Left upper extremity venography 2. Biventricular pacemaker implantation. INTRODUCTION:  Asante Ritacco [place] is a 70 y.o. female with a ischemic CM (EF 30/35%), NYHA Class II CHF, and LBBB QRS morophology.  She presented to hospital with complete heart block.  She had a left heart catheterization that showed significant coronary artery disease and now status post CABG.  She remains in complete heart block.  She thus presents for CRT-P implantation. DESCRIPTION OF PROCEDURE: Informed written consent was obtained and the patient was brought to the electrophysiology lab in the fasting state. The patient was adequately sedated with intravenous Versed, and fentanyl as outlined in the nursing report. The patient's left chest was prepped and draped in the usual sterile fashion by the EP lab staff. The skin overlying the left deltopectoral region was infiltrated with lidocaine for local analgesia. A 5-cm incision was made over the left deltopectoral region. A left subcutaneous pacemaker pocket was fashioned using a combination of sharp and blunt dissection. Electrocautery was used to assure hemostasis. RA/RV Lead Placement: The left axillary vein was cannulated with fluoroscopic visualization. No contrast was required for this endeavor. Through the left axillary vein, and Abbott tendril MRI LPA 1200 M (serial #[id number]) right atrial lead and an Abbott tendril MRI LPA 1200 M (serial number [id number]) right ventricular pacing lead were advanced with fluoroscopic visualization into the right atrial appendage and right ventricular apex positions respectively. Initial atrial lead P-waves measured 1.7 mV with an impedance of 404 ohms and a threshold of 2.1 volts at 0.5 milliseconds. The right ventricular lead R-wave measured 10 mV with impedance of 716 ohms and a threshold of 0.8 volts at 0.5 milliseconds. LV Lead Placement: An Abbott 115 guide was advanced through the left axillary vein  into the low lateral right atrium. A Bard curved Damato catheter was introduced through the guide and used to cannulate the coronary sinus.  A selective coronary sinus venogram was performed by hand injection of nonionic contrast. This demonstrated a large CS body with very small/ atretic distal branches. There was a moderate sized lateral coronary sinus branch was noted along the mid portion of the CS body. No other posterior branches were identified. A Whisper CSJ wire was introduced through the guide and advanced into the distal posterolateral branch. A Saint Jude [id number] (serial number [facility] [id number]) lead was advanced through the MB-2 into the lateral branch. This was approximately one-thirds from the base to the apex in a very lateral position. In this location, the left ventricular lead R-waves measured 4.6 mV with impedance of 702 ohms and a threshold of 1.1 volt at 0.5 milliseconds in the bipolar LV2-LV3 configuration with no diaphragmatic stimulation observed when pacing at 10 volts output. The guide was therefore removed. All three leads were secured to the pectoralis fascia using #2 silk suture over the suture sleeves. The pocket then irrigated with copious gentamicin solution. The leads were then  connected to [place] (serial Number [id number]) device. The pacemaker was placed into the pocket. The pocket was then closed in 3 layers with 2.0 Vicryl suture for the subcutaneous and 3.0 Vicryl suture subcuticular layers. Steri-Strips and a sterile dressing were then applied. DFT testing was not performed today. The procedure was therefore considered completed. EBL<79ml. There were no early apparhent complications. CONCLUSIONS: 1. Ischemic cardiomyopathy with Left bundle-branch block and chronic New York Heart Association class II heart failure. 2. Successful biventricular pacemaker implantation. 3. No early apparent complications.   DG Chest Port 1 View  Result Date: 04/29/2020 CLINICAL DATA:   Shortness of breath. EXAM: PORTABLE CHEST 1 VIEW COMPARISON:  April 28, 2020. FINDINGS: Stable cardiomegaly. Status post coronary bypass graft. No pneumothorax is noted. Mild central pulmonary vascular congestion is noted. Bibasilar opacities are noted concerning for edema or atelectasis with associated pleural effusions, left greater than right. Bony thorax is unremarkable. IMPRESSION: Stable cardiomegaly with mild central pulmonary vascular congestion. Bibasilar opacities are noted concerning for edema or atelectasis with associated pleural effusions, left greater than right. Aortic Atherosclerosis (ICD10-I70.0). Electronically Signed   By: Marijo Conception M.D.   On: 04/29/2020 08:31   DG Chest Port 1 View  Result Date: 04/28/2020 CLINICAL DATA:  Sore chest.  Status post open heart surgery. EXAM: PORTABLE CHEST 1 VIEW COMPARISON:  04/27/2020. FINDINGS: Right IJ sheath in stable position. Prior CABG. Stable cardiomegaly. Persistent bibasilar pulmonary infiltrates/edema and bilateral pleural effusions. No pneumothorax. IMPRESSION: 1.  Right IJ sheath in stable position. 2. Prior CABG. Stable cardiomegaly. Persistent bibasilar infiltrates/edema and bilateral pleural effusions again noted. Electronically Signed   By: Marcello Moores  Register   On: 04/28/2020 06:24   DG Chest Port 1 View  Result Date: 04/27/2020 CLINICAL DATA:  Sore chest. EXAM: PORTABLE CHEST 1 VIEW COMPARISON:  04/26/2020. FINDINGS: Interim extubation and removal of NG tube. Interim removal of mediastinal drainage catheter left chest tube. Right IJ sheath in stable position. Prior CABG. Cardiomegaly. Bibasilar atelectasis. Diffuse bilateral interstitial prominence and bilateral pleural effusions again noted suggesting CHF. Similar findings noted on prior exam. IMPRESSION: 1. Interim extubation and removal of NG tube. Interim removal of mediastinal drainage catheter and left chest tube. No pneumothorax. 2. Prior CABG. Cardiomegaly with bilateral  interstitial prominence and bilateral pleural effusions again noted. Findings consistent CHF. Persistent bibasilar atelectasis. Electronically Signed   By: Marcello Moores  Register   On: 04/27/2020 05:38   DG Chest Port 1 View  Result Date: 04/26/2020 CLINICAL DATA:  Open-heart surgery. EXAM: PORTABLE CHEST 1 VIEW COMPARISON:  04/25/2020. FINDINGS: Endotracheal tube 14 mm above the carina. NG tube, right IJ sheath, mediastinal drainage catheter, left chest tube in stable position. Prior CABG. Heart size stable. Low lung volumes with bibasilar atelectasis. Bilateral mild interstitial prominence. A component of CHF cannot be excluded. Small bilateral pleural effusions noted. No pneumothorax. Peripheral vascular calcification. Mild thoracic spine scoliosis. IMPRESSION: 1.  Endotracheal tube 14 mm above the carina. 2. NG tube, right IJ sheath, mediastinal drainage catheter, left chest tube in stable position. No pneumothorax. 3. Prior CABG. Heart size stable. Mild bilateral interstitial prominence noted on today's exam suggesting interstitial edema. Small bilateral pleural effusions noted. Findings suggest mild CHF. Electronically Signed   By: Marcello Moores  Register   On: 04/26/2020 06:00   DG Chest Port 1 View  Result Date: 04/25/2020 CLINICAL DATA:  Post CABG EXAM: PORTABLE CHEST 1 VIEW COMPARISON:  04/21/2020 FINDINGS: Changes of CABG. Left chest tube in place. No pneumothorax.  Endotracheal tube tip is less than 1 cm above the carina. NG tube is in the stomach. Right central line tip in the SVC. No pneumothorax. Heart is normal size. Mild vascular congestion and bibasilar atelectasis. IMPRESSION: Postoperative changes. Endotracheal tube just above the carina by 8 mm. Vascular congestion, bibasilar atelectasis. Electronically Signed   By: Rolm Baptise M.D.   On: 04/25/2020 18:20   DG Chest Portable 1 View  Result Date: 04/21/2020 CLINICAL DATA:  70 year old female with weakness, syncope, wide complex bradycardia. EXAM:  PORTABLE CHEST 1 VIEW COMPARISON:  Portable chest 09/18/2018 and earlier. FINDINGS: Portable AP semi upright view at 0900 hours. Pacer/resuscitation pads project over the left chest. Larger lung volumes. Mediastinal contours are stable and within normal limits. Visualized tracheal air column is within normal limits. Allowing for portable technique the lungs are clear. No pneumothorax identified, linear external artifact projecting over the outer right lung. No acute osseous abnormality identified. IMPRESSION: No acute cardiopulmonary abnormality. Electronically Signed   By: Genevie Ann M.D.   On: 04/21/2020 09:08   ECHOCARDIOGRAM COMPLETE  Result Date: 04/29/2020    ECHOCARDIOGRAM REPORT   Patient Name:   Birda MOORE [place] Date of Exam: 04/29/2020 Medical Rec #:  [id number]            Height:       67.0 in Accession #:    [id number]           Weight:       132.5 lb Date of Birth:  August 16, 1950           BSA:          1.698 m Patient Age:    67 years             BP:           109/61 mmHg Patient Gender: F                    HR:           52 bpm. Exam Location:  Inpatient Procedure: 2D Echo and Intracardiac Opacification Agent STAT ECHO Indications:    CHF; Needs echo before Pacemaker placement.  History:        Patient has prior history of Echocardiogram examinations, most                 recent 04/25/2020. CHF, Prior CABG, Stroke; Risk                 Factors:Hypertension and Dyslipidemia. Sickle Cell Anemia.  Sonographer:    Darlina Sicilian RDCS Referring Phys: [place]  1. Left ventricular ejection fraction, by estimation, is 30 to 35%. The left ventricle has moderately decreased function. The left ventricle demonstrates regional wall motion abnormalities (see scoring diagram/findings for description). Inferior/lateral  akinesis. Left ventricular diastolic parameters are indeterminate.  2. Right ventricular systolic function is severely reduced. The right ventricular size is normal. There is  normal pulmonary artery systolic pressure.  3. The mitral valve is normal in structure. Trivial mitral valve regurgitation.  4. The aortic valve is tricuspid. Aortic valve regurgitation is moderate. Mild aortic valve sclerosis is present, with no evidence of aortic valve stenosis. FINDINGS  Left Ventricle: Left ventricular ejection fraction, by estimation, is 30 to 35%. The left ventricle has moderately decreased function. The left ventricle demonstrates regional wall motion abnormalities. Definity contrast agent was given IV to delineate the left ventricular endocardial borders. The left ventricular internal cavity size  was normal in size. There is no left ventricular hypertrophy. Left ventricular diastolic parameters are indeterminate.  LV Wall Scoring: The entire lateral wall and entire inferior wall are akinetic. The apex is hypokinetic. The entire anterior wall and entire septum are normal. Right Ventricle: The right ventricular size is normal. Right vetricular wall thickness was not well visualized. Right ventricular systolic function is severely reduced. There is normal pulmonary artery systolic pressure. The tricuspid regurgitant velocity is 2.43 m/s, and with an assumed right atrial pressure of 8 mmHg, the estimated right ventricular systolic pressure is 41.7 mmHg. Left Atrium: Left atrial size was normal in size. Right Atrium: Right atrial size was normal in size. Pericardium: There is no evidence of pericardial effusion. Mitral Valve: The mitral valve is normal in structure. Trivial mitral valve regurgitation. Tricuspid Valve: The tricuspid valve is normal in structure. Tricuspid valve regurgitation is trivial. Aortic Valve: The aortic valve is tricuspid. Aortic valve regurgitation is moderate. Aortic regurgitation PHT measures 630 msec. Mild aortic valve sclerosis is present, with no evidence of aortic valve stenosis. Pulmonic Valve: The pulmonic valve was not well visualized. Pulmonic valve  regurgitation is not visualized. Aorta: The aortic root and ascending aorta are structurally normal, with no evidence of dilitation. IAS/Shunts: The interatrial septum was not well visualized.  LEFT VENTRICLE PLAX 2D LVIDd:         3.30 cm      Diastology LVIDs:         3.10 cm      LV e' medial:    7.18 cm/s LV PW:         1.00 cm      LV E/e' medial:  14.3 LV IVS:        1.00 cm      LV e' lateral:   6.69 cm/s LVOT diam:     1.90 cm      LV E/e' lateral: 15.4 LV SV:         47 LV SV Index:   28 LVOT Area:     2.84 cm  LV Volumes (MOD) LV vol d, MOD A2C: 117.0 ml LV vol d, MOD A4C: 163.0 ml LV vol s, MOD A2C: 82.6 ml LV vol s, MOD A4C: 110.0 ml LV SV MOD A2C:     34.4 ml LV SV MOD A4C:     163.0 ml LV SV MOD BP:      48.8 ml RIGHT VENTRICLE RV S prime:     5.66 cm/s LEFT ATRIUM             Index       RIGHT ATRIUM           Index LA diam:        1.50 cm 0.88 cm/m  RA Area:     11.20 cm LA Vol (A2C):   30.1 ml 17.73 ml/m RA Volume:   25.60 ml  15.08 ml/m LA Vol (A4C):   24.7 ml 14.55 ml/m LA Biplane Vol: 28.4 ml 16.73 ml/m  AORTIC VALVE LVOT Vmax:   105.00 cm/s LVOT Vmean:  61.400 cm/s LVOT VTI:    0.165 m AI PHT:      630 msec  AORTA Ao Root diam: 3.00 cm Ao Asc diam:  2.70 cm MITRAL VALVE                TRICUSPID VALVE MV Area (PHT): 6.48 cm     TR Peak grad:   23.6 mmHg MV Decel Time:  117 msec     TR Vmax:        243.00 cm/s MV E velocity: 103.00 cm/s MV A velocity: 82.50 cm/s   SHUNTS MV E/A ratio:  1.25         Systemic VTI:  0.16 m                             Systemic Diam: 1.90 cm Oswaldo Milian MD Electronically signed by Oswaldo Milian MD Signature Date/Time: 04/29/2020/9:55:55 AM    Final    ECHOCARDIOGRAM COMPLETE  Result Date: 04/21/2020    ECHOCARDIOGRAM REPORT   Patient Name:   Raejean MOORE [place] Date of Exam: 04/21/2020 Medical Rec #:  [id number]            Height:       67.0 in Accession #:    [id number]           Weight:       125.7 lb Date of Birth:  Oct 29, 1950           BSA:           1.660 m Patient Age:    20 years             BP:           127/71 mmHg Patient Gender: F                    HR:           80 bpm. Exam Location:  Inpatient Procedure: 2D Echo, Cardiac Doppler and Color Doppler Indications:    I50.23 Acute on chronic systolic (congestive) heart failure  History:        Patient has prior history of Echocardiogram examinations, most                 recent 09/19/2018. CHF, Stroke; Risk Factors:Hypertension and                 Dyslipidemia. Seizures. Sickle Cell anemia.  Sonographer:    Jonelle Sidle Dance Referring Phys: Peter  1. Left ventricular ejection fraction, by estimation, is 35 to 40%. The left ventricle has moderately decreased function. The left ventricle demonstrates regional wall motion abnormalities (see scoring diagram/findings for description). Left ventricular  diastolic parameters are consistent with Grade I diastolic dysfunction (impaired relaxation). Elevated left ventricular end-diastolic pressure. There is severe hypokinesis of the left ventricular, entire inferior wall and lateral wall.  2. Right ventricular systolic function is normal. The right ventricular size is normal. There is normal pulmonary artery systolic pressure.  3. Left atrial size was mildly dilated.  4. Right atrial size was mildly dilated.  5. The mitral valve is grossly normal. Trivial mitral valve regurgitation.  6. The aortic valve is tricuspid. There is mild calcification of the aortic valve. There is mild thickening of the aortic valve. Aortic valve regurgitation is moderate. Mild to moderate aortic valve sclerosis/calcification is present, without any evidence of aortic stenosis.  7. The inferior vena cava is normal in size with greater than 50% respiratory variability, suggesting right atrial pressure of 3 mmHg. Comparison(s): No significant change from prior study. FINDINGS  Left Ventricle: Left ventricular ejection fraction, by estimation, is 35 to 40%. The  left ventricle has moderately decreased function. The left ventricle demonstrates regional wall motion abnormalities. Severe hypokinesis of the left ventricular, entire  inferior wall and lateral wall. The left ventricular  internal cavity size was normal in size. There is no left ventricular hypertrophy. Abnormal (paradoxical) septal motion, consistent with RV pacemaker. Left ventricular diastolic parameters are consistent with Grade I diastolic dysfunction (impaired relaxation). Elevated left ventricular end-diastolic pressure. Right Ventricle: The right ventricular size is normal. No increase in right ventricular wall thickness. Right ventricular systolic function is normal. There is normal pulmonary artery systolic pressure. The tricuspid regurgitant velocity is 2.13 m/s, and  with an assumed right atrial pressure of 3 mmHg, the estimated right ventricular systolic pressure is 41.3 mmHg. Left Atrium: Left atrial size was mildly dilated. Right Atrium: Right atrial size was mildly dilated. Pericardium: There is no evidence of pericardial effusion. Mitral Valve: The mitral valve is grossly normal. There is mild thickening of the mitral valve leaflet(s). There is mild calcification of the mitral valve leaflet(s). Trivial mitral valve regurgitation. Tricuspid Valve: The tricuspid valve is normal in structure. Tricuspid valve regurgitation is trivial. Aortic Valve: The aortic valve is tricuspid. There is mild calcification of the aortic valve. There is mild thickening of the aortic valve. Aortic valve regurgitation is moderate. Aortic regurgitation PHT measures 351 msec. Mild to moderate aortic valve sclerosis/calcification is present, without any evidence of aortic stenosis. Pulmonic Valve: The pulmonic valve was not well visualized. Pulmonic valve regurgitation is not visualized. Aorta: The aortic root, ascending aorta and aortic arch are all structurally normal, with no evidence of dilitation or obstruction. Venous:  The inferior vena cava is normal in size with greater than 50% respiratory variability, suggesting right atrial pressure of 3 mmHg. IAS/Shunts: The atrial septum is grossly normal. Additional Comments: A device lead is visualized.  LEFT VENTRICLE PLAX 2D LVIDd:         3.50 cm  Diastology LVIDs:         2.50 cm  LV e' medial:    4.03 cm/s LV PW:         1.20 cm  LV E/e' medial:  19.0 LV IVS:        1.00 cm  LV e' lateral:   3.50 cm/s LVOT diam:     1.80 cm  LV E/e' lateral: 21.9 LV SV:         32 LV SV Index:   19 LVOT Area:     2.54 cm                          3D Volume EF:                         3D EF:        37 % RIGHT VENTRICLE            IVC RV Basal diam:  2.80 cm    IVC diam: 2.00 cm RV S prime:     9.99 cm/s TAPSE (M-mode): 1.9 cm LEFT ATRIUM             Index       RIGHT ATRIUM           Index LA diam:        2.70 cm 1.63 cm/m  RA Area:     13.20 cm LA Vol (A2C):   46.6 ml 28.08 ml/m RA Volume:   29.90 ml  18.01 ml/m LA Vol (A4C):   33.7 ml 20.30 ml/m LA Biplane Vol: 39.7 ml 23.92 ml/m  AORTIC VALVE LVOT Vmax:   73.80 cm/s LVOT Vmean:  45.300 cm/s LVOT VTI:    0.127 m AI PHT:      351 msec  AORTA Ao Root diam: 3.10 cm Ao Asc diam:  3.00 cm MITRAL VALVE                TRICUSPID VALVE MV Area (PHT): 3.77 cm     TR Peak grad:   18.1 mmHg MV Decel Time: 201 msec     TR Vmax:        213.00 cm/s MV E velocity: 76.70 cm/s MV A velocity: 108.00 cm/s  SHUNTS MV E/A ratio:  0.71         Systemic VTI:  0.13 m                             Systemic Diam: 1.80 cm Buford Dresser MD Electronically signed by Buford Dresser MD Signature Date/Time: 04/21/2020/9:06:37 PM    Final    ECHO INTRAOPERATIVE TEE  Result Date: 04/25/2020  *INTRAOPERATIVE TRANSESOPHAGEAL REPORT *  Patient Name:   Archita MOORE [place] Date of Exam: 04/25/2020 Medical Rec #:  [id number]            Height:       67.0 in Accession #:    [id number]           Weight:       119.7 lb Date of Birth:  [birth year]           BSA:          1.63  m Patient Age:    5 years             BP:           113/55 mmHg Patient Gender: F                    HR:           76 bpm. Exam Location:  Anesthesiology Transesophogeal exam was perform intraoperatively during surgical procedure. Patient was closely monitored under general anesthesia during the entirety of examination. Indications:     Coronary Artery Disease Sonographer:     Bernadene Person RDCS Performing Phys: [place] Diagnosing Phys: Annye Asa MD PROCEDURE: Intraoperative Transesophogeal Teeth intact after easy TEE probe insertion. Complications: No known complications during this procedure. POST-OP IMPRESSIONS Limited Post CPB exam: The patient separated from CPB easily with low MIlrinone support. - Left Ventricle: The overall left ventricular is essentially unchanged from pre-bypass images. There is some interval improvement in the basal region, and also in that infero-septal wall. Overall EF 45-50%. - Right Ventricle: The right ventricular function appears unchanged from pre-bypass images. - Aortic Valve: The aortic valve function appears unchanged from pre-bypass images. Mild, anterior directed AI is present. - Mitral Valve: The mitral valve function appears unchanged from pre-bypass images. - Tricuspid Valve: The tricuspid valve function appears unchanged from pre-bypass images. PRE-OP FINDINGS  Left Ventricle: The left ventricle has mild-moderately reduced systolic function, with an ejection fraction of 40-45%, measured 40%. The cavity size was mildly dilated. Left ventricular has diffuse hypokinesis, with Severe hypokinesis of the Basal inferoseptal wall and entire basal region. There is no left ventricular hypertrophy. Left ventricular diastolic function was not evaluated. Right Ventricle: The right ventricle has normal systolic function. The cavity was normal. There is no increase in right ventricular wall thickness. Left Atrium: Left atrial size was normal in size. No left  atrial/left atrial appendage  thrombus was detected. Left atrial appendage velocity is normal at greater than 40 cm/s. Right Atrium: Right atrial size was normal in size. Catheter present traversing through the right atrium. Interatrial Septum: No atrial level shunt detected by color flow Doppler. There is left bowing of the interatrial septum, suggestive of elevated right atrial pressure. There is no evidence of a patent foramen ovale by color doppler interrogation. Pericardium: There is no evidence of pericardial effusion. Mitral Valve: The mitral valve is normal in structure. Mitral valve regurgitation is trivial by color flow Doppler. There is no evidence of mitral valve vegetation. There is no evidence of mitral stenosis, with peak gradient 3 mmHg, mean gradient 1 mmHg. Tricuspid Valve: The tricuspid valve was normal in structure. Tricuspid valve regurgitation is trivial by color flow Doppler. No evidence of tricuspid stenosis is present. There is no evidence of tricuspid valve vegetation. Aortic Valve: The aortic valve is tricuspid. Aortic valve regurgitation is mild by color flow Doppler. The jet is anteriorly-directed toward the anterior leaflet of the mitral valve. There is no stenosis of the aortic valve, with peak gradient 8 mmHg, mean gradient 4 mmHg. There is no evidence of aortic valve vegetation. Pulmonic Valve: The pulmonic valve was normal in structure, with normal leaflet excursion. No evidence of pulmonic stenosis. Pulmonic valve regurgitation is trivial, around the PA catheter, by color flow Doppler. Aorta: There is evidence of calcification at the Sinotubular ridge and in the proximal ascending aorta. There is scattered moderate plaque in the aortic arch and descending aortic.Marland Kitchen Pulmonary Artery: Gordy Councilman catheter present on the right. The pulmonary artery is of normal size. Venous: The inferior vena cava is normal in size with less than 50% respiratory variability, suggesting right atrial  pressure of 8 mmHg. Shunts: There is no evidence of an atrial septal defect. +-------------+--------++ AORTIC VALVE          +-------------+--------++ AV Mean Grad:4.0 mmHg +-------------+--------++ +-------------+--------++ MITRAL VALVE          +-------------+--------++ MV Mean grad:1.0 mmHg +-------------+--------++  Annye Asa MD Electronically signed by Annye Asa MD Signature Date/Time: 04/25/2020/5:27:32 PM    Final    VAS US DOPPLER PRE CABG  Result Date: 04/25/2020 PREOPERATIVE VASCULAR EVALUATION  Indications:      Pre-CABG. 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 Other Factors:    Sickle Cell Disease, CHF, seizure disorder. Limitations:      Right arm is contracted Comparison Study: No prior studies Performing Technologist: Sharion Dove RVS  Examination Guidelines: A complete evaluation includes B-mode imaging, spectral Doppler, color Doppler, and power Doppler as needed of all accessible portions of each vessel. Bilateral testing is considered an integral part of a complete examination. Limited examinations for reoccurring indications may be performed as noted.  Right Carotid Findings: +----------+--------+--------+--------+------------+--------+           PSV cm/sEDV cm/sStenosisDescribe    Comments +----------+--------+--------+--------+------------+--------+ CCA Prox  85      14                                   +----------+--------+--------+--------+------------+--------+ CCA Distal75      20              heterogenous         +----------+--------+--------+--------+------------+--------+  ICA Prox  96      17      1-39%   heterogenous         +----------+--------+--------+--------+------------+--------+ ICA Distal91      28                                   +----------+--------+--------+--------+------------+--------+ ECA        80      11                                   +----------+--------+--------+--------+------------+--------+ Portions of this table do not appear on this page. +----------+--------+-------+--------+------------+           PSV cm/sEDV cmsDescribeArm Pressure +----------+--------+-------+--------+------------+ Subclavian95                                  +----------+--------+-------+--------+------------+ +---------+--------+--+--------+--+ VertebralPSV cm/s75EDV cm/s12 +---------+--------+--+--------+--+ Left Carotid Findings: +----------+--------+--------+--------+----------------------+---------+           PSV cm/sEDV cm/sStenosisDescribe              Comments  +----------+--------+--------+--------+----------------------+---------+ CCA Prox  79      12              heterogenous                    +----------+--------+--------+--------+----------------------+---------+ CCA Distal80      9               irregular and calcific          +----------+--------+--------+--------+----------------------+---------+ ICA Prox  124     21      1-39%   heterogenous          Shadowing +----------+--------+--------+--------+----------------------+---------+ ICA Distal85      22                                              +----------+--------+--------+--------+----------------------+---------+ ECA       87      15                                              +----------+--------+--------+--------+----------------------+---------+ +----------+--------+--------+--------+------------+ SubclavianPSV cm/sEDV cm/sDescribeArm Pressure +----------+--------+--------+--------+------------+           90      1                            +----------+--------+--------+--------+------------+ +---------+--------+--+--------+--+ VertebralPSV cm/s90EDV cm/s13 +---------+--------+--+--------+--+  ABI Findings:  +--------+------------------+-----+------------------+--------------+ Right   Rt Pressure (mmHg)IndexWaveform          Comment        +--------+------------------+-----+------------------+--------------+ [id number]                                      contracted arm +--------+------------------+-----+------------------+--------------+ PTA     254               2.07 monophasic                       +--------+------------------+-----+------------------+--------------+  DP                             unable to insonate               +--------+------------------+-----+------------------+--------------+ +--------+------------------+-----+-----------+-------+ Left    Lt Pressure (mmHg)IndexWaveform   Comment +--------+------------------+-----+-----------+-------+ [id number]                    multiphasic        +--------+------------------+-----+-----------+-------+ PTA     112               0.91 monophasic         +--------+------------------+-----+-----------+-------+ DP      64                0.52 monophasic         +--------+------------------+-----+-----------+-------+ +-------+---------------+----------------+ ABI/TBIToday's ABI/TBIPrevious ABI/TBI +-------+---------------+----------------+ Right  2.07                            +-------+---------------+----------------+ Left   0.91                            +-------+---------------+----------------+  Right Doppler Findings: +-----------+--------+-----+-------+--------------+ Site       PressureIndexDopplerComments       +-----------+--------+-----+-------+--------------+ Brachial   123                 contracted arm +-----------+--------+-----+-------+--------------+ Radial                         contracted arm +-----------+--------+-----+-------+--------------+ Ulnar                          contracted arm +-----------+--------+-----+-------+--------------+ Palmar Arch                     contracted arm +-----------+--------+-----+-------+--------------+  Left Doppler Findings: +--------+--------+-----+-----------+--------+ Site    PressureIndexDoppler    Comments +--------+--------+-----+-----------+--------+ [id number]          multiphasic         +--------+--------+-----+-----------+--------+ Radial               multiphasic         +--------+--------+-----+-----------+--------+ Ulnar                multiphasic         +--------+--------+-----+-----------+--------+  Summary: Right Carotid: Velocities in the right ICA are consistent with a 1-39% stenosis. Left Carotid: Velocities in the left ICA are consistent with a 1-39% stenosis. Vertebrals:  Bilateral vertebral arteries demonstrate antegrade flow. Subclavians: Normal flow hemodynamics were seen in bilateral subclavian              arteries. Right ABI: Resting right ankle-brachial index indicates noncompressible right lower extremity arteries. Left ABI: Resting left ankle-brachial index indicates mild left lower extremity arterial disease. Right Upper Extremity: Patient has contracted arm, unable to perform testing Left Upper Extremity: Doppler waveforms decrease <50% w left radial compression. Doppler waveform obliterate with left ulnar compression.  Electronically signed by Ruta Hinds MD on 04/25/2020 at 3:02:47 PM.    Final    [place] EKG SITE RITE  Result Date: 04/30/2020 If Site Rite image not attached, placement could not be confirmed due to current cardiac rhythm.   Assessment/Plan  1. Complete heart block (HCC) -  Digoxin and  Carvedilol were held -  S/P PPM placement on 04/29/20  2. Chronic systolic CHF (congestive heart failure) (HCC) -EF 35 to 40% -Repeat echo after CABG 30 to 35% -Continue Entresto 24 mg / 26 mg 1 tab twice a day, spironolactone 12.5 mg daily, metoprolol succinate ER 12.5 mg at bedtime and Lasix 20 mg daily  3. History of CVA (cerebrovascular accident) -Continue  Coumadin 4 mg 1 tab daily, with INR goal 2-3 -Continue atorvastatin 80 mg 1 tab at bedtime  4. Seizure disorder (HCC) -Continue Vimpat 100 mg 1 tab twice a day and levetiracetam 1500 mg twice a day  5. Coronary artery disease involving native coronary artery of native heart, unspecified whether angina present -  S/P CABG on 4/4 -   No chest pains, continue aspirin EC 81 mg 1 tab daily    Family/ staff Communication: Discussed plan of care with resident in charge nurse.  Labs/tests ordered: CBC and BMP  Goals of care:   Short-term care   Durenda Age, DNP, MSN, FNP-BC [facility] [phone] (Monday-Friday 8:00 a.m. - 5:00 p.m.) [phone] (after hours)

## 2020-05-09 NOTE — Progress Notes (Incomplete)
Location:  [facility] Room Number: 213/A Place of Service:  SNF (31) Provider:  Durenda Age, DNP, FNP-BC  Patient Care Team: Just, [name], FNP as PCP - General (Family Medicine) Skeet Latch, MD as PCP - Cardiology (Cardiology) Adrian Prows, MD as Attending Physician (Cardiology) Burnard Bunting, MD (Internal Medicine)  Extended Emergency Contact Information Primary Emergency Contact: Zachow,[name] Address: [address]          [place], [place] [address] [name] of [place] Phone: [phone] Mobile Phone: [phone] Relation: Spouse Secondary Emergency Contact: Justice Deeds          [place], [place] [address] [name] of [place] Phone: [phone] Mobile Phone: [phone] Relation: Son  Code Status:  Full Code  Goals of care: Advanced Directive information Advanced Directives 05/09/2020  Does Patient Have a Medical Advance Directive? No  Type of Advance Directive -  Does patient want to make changes to medical advance directive? -  Copy of [place] in Chart? -  Would patient like information on creating a medical advance directive? No - Patient declined  Pre-existing out of facility DNR order (yellow form or pink MOST form) -     Chief Complaint  Patient presents with  . Hospitalization Follow-up    Hospitalization Follow Up     HPI:  Pt is a 70 y.o. female who was admitted to [facility] on 05/05/20 post [facility] admission 04/21/20 to 05/05/20.  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  She, also, has seizure disorder, hypertension, dyslipidemia, sickle cell anemia, combined diastolic and systolic heart failure.  Myoview stress test in 2020 showed large severe fixed inferior, anterior apical and inferolateral perfusion defects, ejection fraction  was 34%.  She was apparently in their usual state of health 1 day prior to hospitalization when her husband witnessed what he thought was a seizure.  Patient had just gotten dressed to go out with her husband and was putting scar around her neck when he noticed her making gurgling sounds.  She was slammed into a chair but had not lost her consciousness.  EMS was called and the cardiac monitor was placed.  She was found to be in complete heart block and was transported to [facility] ED while being percutaneously placed.  In the ED, a transvenous lead was placed emergently and pacing continued.  Digoxin and carvedilol that she had been taking regularly were held.  The heart failure team was consulted.  Left heart catheterization was recommended and was carried out.  It demonstrated a 70% distal left main coronary artery stenosis along with a 70% stenosis in the proximal circumflex.  Repeat echo showed ejection fraction of around 35%.  There is moderate aortic valve sclerosis without significant stenosis.  The mitral valve had trivial insufficiency, no significant stenosis.  Cardiothoracic surgery was consulted and thought that PCI would be technically difficult she was felt to be high risk for postoperative complications and she would likely have a prolonged recovery due to her previous stroke deficits.  They ultimately wished to proceed with CABG procedure.  She underwent CABG x2 utilizing LIMA to LAD and SVG to OM on 04/25/2020.  She underwent endoscopic harvest of her greater saphenous vein from her right thigh.  Hospitalization was complicated by bibasilar atelectasis on POD 3 for which she was encouraged to use incentive spirometer.  INR on 05/05/2020 was 2.0 and was discharged on Coumadin 4 mg daily for her history of CVA.  INR goal 2-3.  EP decided PPM placement on 04/29/2020.  She developed some gout-like symptoms and was put on colchicine x7 days.  She was seen in the room today.  She is aphasic and has  right hemiplegia.  She was able to follow simple verbal commands.   Past Medical History:  Diagnosis Date  . Adenomatous polyp of colon 09/2012   repeat colonoscopy in 5 years by Dr. Sharlett Iles  . CHF (congestive heart failure) (HCC)    EF 15-20% as of 05/28/11 (Dr Legrand [facility] D/C summary)  . CVA (cerebral infarction) 12/2007  . Hemiparesis ([facility])   . Hyperlipidemia   . Hypertension   . Seizures ([facility])   . Sickle cell anemia (HCC)   . Stroke ([facility])   . Vitamin D deficiency 2010   Past Surgical History:  Procedure Laterality Date  . ABDOMINAL HYSTERECTOMY    . BIV PACEMAKER INSERTION CRT-P N/A 04/29/2020   Procedure: BIV PACEMAKER INSERTION CRT-P;  Surgeon: Constance Haw, MD;  Location: [facility];  Service: Cardiovascular;  Laterality: N/A;  . CORONARY ARTERY BYPASS GRAFT N/A 04/25/2020   Procedure: CORONARY ARTERY BYPASS GRAFTING (CABG) TIMES TWO USING LEFT INTERNAL MAMMARY ARTERY AND RIGHT GREATER SAPHENOUS VEIN HARVESTED ENDOSCOPICALLY;  Surgeon: Melrose Nakayama, MD;  Location: [facility];  Service: Open Heart Surgery;  Laterality: N/A;  . FRACTURE SURGERY    . HIP SURGERY    . LEFT HEART CATH AND CORONARY ANGIOGRAPHY N/A 04/22/2020   Procedure: LEFT HEART CATH AND CORONARY ANGIOGRAPHY;  Surgeon: Larey Dresser, MD;  Location: [facility];  Service: Cardiovascular;  Laterality: N/A;  . TEE WITHOUT CARDIOVERSION  04/25/2020   Procedure: TRANSESOPHAGEAL ECHOCARDIOGRAM (TEE);  Surgeon: Melrose Nakayama, MD;  Location[facility];  Service: Open Heart Surgery;;  . TEMPORARY PACEMAKER N/A 04/21/2020   Procedure: TEMPORARY PACEMAKER;  Surgeon: Larey Dresser, MD;  Location: [facility];  Service: Cardiovascular;  Laterality: N/A;  . TUBAL LIGATION      Allergies  Allergen Reactions  . Lexiscan [Regadenoson] Other (See Comments)    Perioral tingling and throat tightness    Outpatient Encounter Medications as of 05/09/2020  Medication Sig  .  acetaminophen (TYLENOL) 325 MG tablet Take 1-2 tablets (325-650 mg total) by mouth every 4 (four) hours as needed for mild pain.  Marland Kitchen aspirin EC 81 MG EC tablet Take 1 tablet (81 mg total) by mouth daily. Swallow whole.  Marland Kitchen atorvastatin (LIPITOR) 80 MG tablet TAKE 1 TABLET AT BEDTIME  . baclofen (LIORESAL) 20 MG tablet Take 20 mg by mouth at bedtime as needed for muscle spasms.  . baclofen (LIORESAL) 20 MG tablet Take 20 mg by mouth 3 (three) times daily. For Muscle Spasms  . Cholecalciferol 50 MCG (2000 UT) TABS Take 1 tablet by mouth daily in the afternoon.  . colchicine 0.6 MG tablet Take 1 tablet (0.6 mg total) by mouth daily.  . Lacosamide (VIMPAT) 100 MG TABS Take 1 tablet by mouth 2 (two) times daily. For Seizures  . levETIRAcetam (KEPPRA) 750 MG tablet Take 2 tablets (1,500 mg total) by mouth 2 (two) times daily.  . metoprolol succinate (TOPROL-XL) 25 MG 24 hr tablet Take 0.5 tablets (12.5 mg total) by mouth at bedtime.  . polyethylene glycol (MIRALAX / GLYCOLAX) packet Take 17 g by mouth daily as needed for moderate constipation.  Marland Kitchen  sacubitril-valsartan (ENTRESTO) 24-26 MG Take 1 tablet by mouth 2 (two) times daily.  Marland Kitchen spironolactone (ALDACTONE) 25 MG tablet TAKE 1/2 TABLET EVERY DAY  . traMADol (ULTRAM) 50 MG tablet Take 1 tablet (50 mg total) by mouth every 6 (six) hours as needed for moderate pain.  Marland Kitchen warfarin (COUMADIN) 4 MG tablet Take 4 mg by mouth daily.  . [DISCONTINUED] baclofen (LIORESAL) 20 MG tablet TAKE 1 TABLET THREE TIMES DAILY (MAY TAKE AN ADDITIONAL TABLET MID-DAY OR BEFORE BED AS NEEDED FOR MUSCLE SPASMS)  . [DISCONTINUED] Lacosamide 100 MG TABS Take 1 tablet (100 mg total) by mouth 2 (two) times daily.  . [DISCONTINUED] warfarin (COUMADIN) 4 MG tablet Take 1 tablet (4 mg total) by mouth one time only at 4 PM.   No facility-administered encounter medications on file as of 05/09/2020.    Review of Systems unable to obtain due to aphasia    Immunization History   Administered Date(s) Administered  . Fluad Quad(high Dose 65+) 10/10/2018  . Influenza,inj,Quad PF,6+ Mos 12/12/2012, 10/23/2013, 09/30/2014, 04/05/2016  . PFIZER Comirnaty(Gray Top)Covid-19 Tri-Sucrose Vaccine 05/04/2020  . PFIZER(Purple Top)SARS-COV-2 Vaccination 03/22/2019, 04/15/2019  . Pneumococcal Conjugate-13 09/30/2014  . Pneumococcal Polysaccharide-23 04/05/2016  . Tdap 12/12/2012   Pertinent  Health Maintenance Due  Topic Date Due  . COLONOSCOPY (Pts 45-70yrs Insurance coverage will need to be confirmed)  10/07/2020 (Originally 10/21/2017)  . INFLUENZA VACCINE  08/22/2020  . MAMMOGRAM  08/03/2021  . DEXA SCAN  Completed  . PNA vac Low Risk Adult  Completed   Fall Risk  04/12/2020 03/02/2020 12/14/2019 10/08/2019 04/09/2019  Falls in the past year? 0 0 0 - 0  Number falls in past yr: 0 0 0 0 0  Injury with Fall? 0 0 0 0 0  Risk for fall due to : - - - - Impaired balance/gait;Mental status change  Follow up Falls evaluation completed Falls evaluation completed Falls evaluation completed - -     Vitals:   05/09/20 1602  BP: 119/65  Pulse: 98  Resp: 19  Temp: (!) 97.2 F (36.2 C)  SpO2: 93%  Weight: 123 lb (55.8 kg)  Height: 5\' 4"  (1.626 m)   Body mass index is 21.11 kg/m.  Physical Exam  GENERAL APPEARANCE: Well nourished. In no acute distress. Normal body habitus SKIN:  Skin is warm and dry.  MOUTH and THROAT: Lips are without lesions. Oral mucosa is moist and without lesions.  RESPIRATORY: Breathing is even & unlabored, BS CTAB CARDIAC: RRR, no murmur,no extra heart sounds GI: Abdomen soft, normal BS, no masses, no tenderness NEUROLOGICAL: There is no tremor.  Aphasic.  Right hemiplegia. PSYCHIATRIC:  Affect and behavior are appropriate  Labs reviewed: Recent Labs    04/25/20 2044 04/26/20 0014 04/26/20 0117 04/26/20 0811 04/26/20 1555 04/27/20 0359 05/02/20 0040 05/03/20 0111 05/04/20 0232  NA 136   < > 136   < > 137   < > 138 137 136  K 3.7   < >  4.1   < > 4.3   < > 4.0 3.5 3.9  CL 107  --  105  --  106   < > 104 101 105  CO2 22  --  22  --  25   < > 25 24 24   GLUCOSE 110*  --  170*  --  139*   < > 111* 115* 100*  BUN 12  --  13  --  13   < > 14 16 11   CREATININE  0.78  --  0.90  --  1.06*   < > 1.00 0.94 0.83  CALCIUM 8.5*  --  8.7*  --  8.2*   < > 8.6* 8.6* 8.5*  MG 2.7*  --  2.9*  --  2.2  --   --   --   --    < > = values in this interval not displayed.   Recent Labs    10/08/19 1031 12/14/19 1020 04/21/20 0825  AST 24 24 28   ALT 13 9 15   ALKPHOS 161* 156* 95  BILITOT <0.2 0.3 0.4  PROT 7.3 7.5 6.5  ALBUMIN 4.2 4.3 3.3*   Recent Labs    03/02/20 1221 04/21/20 0825 04/21/20 0832 05/03/20 0111 05/04/20 0232 05/05/20 0031  WBC 6.8 9.2   < > 14.7* 12.6* 14.0*  NEUTROABS 4.0 1.7  --   --   --   --   HGB 12.8 11.1*   < > 9.6* 9.4* 9.4*  HCT 38.5 37.0   < > 29.0* 28.2* 28.5*  MCV 85 95.9   < > 87.3 87.6 88.5  PLT 303 322   < > 356 429* 533*   < > = values in this interval not displayed.   Lab Results  Component Value Date   TSH 1.846 10/23/2013   Lab Results  Component Value Date   HGBA1C 6.1 (H) 04/22/2020   Lab Results  Component Value Date   CHOL 124 04/22/2020   HDL 41 04/22/2020   LDLCALC 64 04/22/2020   TRIG 94 04/22/2020   CHOLHDL 3.0 04/22/2020    Significant Diagnostic Results in last 30 days:  DG Chest 2 View  Result Date: 04/30/2020 CLINICAL DATA:  Pacemaker placement EXAM: CHEST - 2 VIEW COMPARISON:  April 29, 2020 FINDINGS: Pacemaker present on the left. Pacemaker leads are attached to the right atrium, right ventricle, and coronary sinus. Temporary pacemaker wires are also attached to the right heart. No evident pneumothorax. Heart is mildly enlarged with pulmonary vascularity normal. Patient is status post coronary artery bypass grafting. There is a left pleural effusion consolidation in portions of the lingula and left lower lobe. Minimal right pleural effusion. Mild right base atelectasis.  There is aortic atherosclerosis. No bone lesions. IMPRESSION: Pacemaker leads as described. No pneumothorax. Mild cardiomegaly. Left pleural effusion with consolidation in portions of the left lower lobe and lingula. Suspect combination of atelectasis and pneumonia. Minimal right pleural effusion with right base atelectasis. Right lung otherwise clear. Aortic Atherosclerosis (ICD10-I70.0). Electronically Signed   By: Lowella Grip III M.D.   On: 04/30/2020 08:46   CARDIAC CATHETERIZATION  Result Date: 04/22/2020  Dist LM lesion is 70% stenosed.  Ost Cx lesion is 70% stenosed.  Prox Cx lesion is 99% stenosed.  1. LVEDP 7. 2. 60-70% distal left main stenosis. 3. Heavily calcified proximal LCx.  60-70% stenosis at the ostium with discrete 99% stenosis in the proximal LCx.  I suspect that her CHB is related to chronic ischemia from LM-LCx disease.  Difficult situation.  She has surgical disease but seems to be a poor candidate for CABG with weakness and aphasia from prior stroke though per her husband, she does all ADLs and walks with cane at home.  PCI would be possible but high risk/difficult heavy calcification and tortuousity.  I discussed the films with Dr. [place].  Will ask surgery to see for an opinion.  If they turn her down for CABG, would plan high risk PCI from LM into  the proximal LCx on Monday or Tuesday.  Will need to keep TTVP in place.   CARDIAC CATHETERIZATION  Result Date: 04/21/2020 Successful temporary transvenous pacemaker placement from right femoral vein.  Left at 80 bpm, 8 mA.  Threshold 3.5 mA.   EP PPM/ICD IMPLANT  Result Date: 04/29/2020 SURGEON: Allegra Lai, MD PREPROCEDURE DIAGNOSES: 1. Ischemic cardiomyopathy. 2. New York Heart Association class II, heart failure chronically. 3. Left bundle-branch block. POSTPROCEDURE DIAGNOSES: 1. Ischemic cardiomyopathy. 2. New York Heart Association class II heart failure chronically. 3. Left bundle-branch block. PROCEDURES: 1. Left  upper extremity venography 2. Biventricular pacemaker implantation. INTRODUCTION:  Evann Koelzer [place] is a 70 y.o. female with a ischemic CM (EF 30/35%), NYHA Class II CHF, and LBBB QRS morophology.  She presented to hospital with complete heart block.  She had a left heart catheterization that showed significant coronary artery disease and now status post CABG.  She remains in complete heart block.  She thus presents for CRT-P implantation. DESCRIPTION OF PROCEDURE: Informed written consent was obtained and the patient was brought to the electrophysiology lab in the fasting state. The patient was adequately sedated with intravenous Versed, and fentanyl as outlined in the nursing report. The patient's left chest was prepped and draped in the usual sterile fashion by the EP lab staff. The skin overlying the left deltopectoral region was infiltrated with lidocaine for local analgesia. A 5-cm incision was made over the left deltopectoral region. A left subcutaneous pacemaker pocket was fashioned using a combination of sharp and blunt dissection. Electrocautery was used to assure hemostasis. RA/RV Lead Placement: The left axillary vein was cannulated with fluoroscopic visualization. No contrast was required for this endeavor. Through the left axillary vein, and Abbott tendril MRI LPA 1200 M (serial #[id number]) right atrial lead and an Abbott tendril MRI LPA 1200 M (serial number [id number]) right ventricular pacing lead were advanced with fluoroscopic visualization into the right atrial appendage and right ventricular apex positions respectively. Initial atrial lead P-waves measured 1.7 mV with an impedance of 404 ohms and a threshold of 2.1 volts at 0.5 milliseconds. The right ventricular lead R-wave measured 10 mV with impedance of 716 ohms and a threshold of 0.8 volts at 0.5 milliseconds. LV Lead Placement: An Abbott 115 guide was advanced through the left axillary vein into the low lateral right atrium. A Bard  curved Damato catheter was introduced through the guide and used to cannulate the coronary sinus.  A selective coronary sinus venogram was performed by hand injection of nonionic contrast. This demonstrated a large CS body with very small/ atretic distal branches. There was a moderate sized lateral coronary sinus branch was noted along the mid portion of the CS body. No other posterior branches were identified. A Whisper CSJ wire was introduced through the guide and advanced into the distal posterolateral branch. A Saint Jude [id number] (serial number [facility] [id number]) lead was advanced through the MB-2 into the lateral branch. This was approximately one-thirds from the base to the apex in a very lateral position. In this location, the left ventricular lead R-waves measured 4.6 mV with impedance of 702 ohms and a threshold of 1.1 volt at 0.5 milliseconds in the bipolar LV2-LV3 configuration with no diaphragmatic stimulation observed when pacing at 10 volts output. The guide was therefore removed. All three leads were secured to the pectoralis fascia using #2 silk suture over the suture sleeves. The pocket then irrigated with copious gentamicin solution. The leads were then connected to [place]  Allure (serial Number [id number]) device. The pacemaker was placed into the pocket. The pocket was then closed in 3 layers with 2.0 Vicryl suture for the subcutaneous and 3.0 Vicryl suture subcuticular layers. Steri-Strips and a sterile dressing were then applied. DFT testing was not performed today. The procedure was therefore considered completed. EBL<23ml. There were no early apparhent complications. CONCLUSIONS: 1. Ischemic cardiomyopathy with Left bundle-branch block and chronic New York Heart Association class II heart failure. 2. Successful biventricular pacemaker implantation. 3. No early apparent complications.   DG Chest Port 1 View  Result Date: 04/29/2020 CLINICAL DATA:  Shortness of breath. EXAM: PORTABLE CHEST 1  VIEW COMPARISON:  April 28, 2020. FINDINGS: Stable cardiomegaly. Status post coronary bypass graft. No pneumothorax is noted. Mild central pulmonary vascular congestion is noted. Bibasilar opacities are noted concerning for edema or atelectasis with associated pleural effusions, left greater than right. Bony thorax is unremarkable. IMPRESSION: Stable cardiomegaly with mild central pulmonary vascular congestion. Bibasilar opacities are noted concerning for edema or atelectasis with associated pleural effusions, left greater than right. Aortic Atherosclerosis (ICD10-I70.0). Electronically Signed   By: Marijo Conception M.D.   On: 04/29/2020 08:31   DG Chest Port 1 View  Result Date: 04/28/2020 CLINICAL DATA:  Sore chest.  Status post open heart surgery. EXAM: PORTABLE CHEST 1 VIEW COMPARISON:  04/27/2020. FINDINGS: Right IJ sheath in stable position. Prior CABG. Stable cardiomegaly. Persistent bibasilar pulmonary infiltrates/edema and bilateral pleural effusions. No pneumothorax. IMPRESSION: 1.  Right IJ sheath in stable position. 2. Prior CABG. Stable cardiomegaly. Persistent bibasilar infiltrates/edema and bilateral pleural effusions again noted. Electronically Signed   By: Marcello Moores  Register   On: 04/28/2020 06:24   DG Chest Port 1 View  Result Date: 04/27/2020 CLINICAL DATA:  Sore chest. EXAM: PORTABLE CHEST 1 VIEW COMPARISON:  04/26/2020. FINDINGS: Interim extubation and removal of NG tube. Interim removal of mediastinal drainage catheter left chest tube. Right IJ sheath in stable position. Prior CABG. Cardiomegaly. Bibasilar atelectasis. Diffuse bilateral interstitial prominence and bilateral pleural effusions again noted suggesting CHF. Similar findings noted on prior exam. IMPRESSION: 1. Interim extubation and removal of NG tube. Interim removal of mediastinal drainage catheter and left chest tube. No pneumothorax. 2. Prior CABG. Cardiomegaly with bilateral interstitial prominence and bilateral pleural  effusions again noted. Findings consistent CHF. Persistent bibasilar atelectasis. Electronically Signed   By: Marcello Moores  Register   On: 04/27/2020 05:38   DG Chest Port 1 View  Result Date: 04/26/2020 CLINICAL DATA:  Open-heart surgery. EXAM: PORTABLE CHEST 1 VIEW COMPARISON:  04/25/2020. FINDINGS: Endotracheal tube 14 mm above the carina. NG tube, right IJ sheath, mediastinal drainage catheter, left chest tube in stable position. Prior CABG. Heart size stable. Low lung volumes with bibasilar atelectasis. Bilateral mild interstitial prominence. A component of CHF cannot be excluded. Small bilateral pleural effusions noted. No pneumothorax. Peripheral vascular calcification. Mild thoracic spine scoliosis. IMPRESSION: 1.  Endotracheal tube 14 mm above the carina. 2. NG tube, right IJ sheath, mediastinal drainage catheter, left chest tube in stable position. No pneumothorax. 3. Prior CABG. Heart size stable. Mild bilateral interstitial prominence noted on today's exam suggesting interstitial edema. Small bilateral pleural effusions noted. Findings suggest mild CHF. Electronically Signed   By: Marcello Moores  Register   On: 04/26/2020 06:00   DG Chest Port 1 View  Result Date: 04/25/2020 CLINICAL DATA:  Post CABG EXAM: PORTABLE CHEST 1 VIEW COMPARISON:  04/21/2020 FINDINGS: Changes of CABG. Left chest tube in place. No pneumothorax. Endotracheal tube tip is less  than 1 cm above the carina. NG tube is in the stomach. Right central line tip in the SVC. No pneumothorax. Heart is normal size. Mild vascular congestion and bibasilar atelectasis. IMPRESSION: Postoperative changes. Endotracheal tube just above the carina by 8 mm. Vascular congestion, bibasilar atelectasis. Electronically Signed   By: Rolm Baptise M.D.   On: 04/25/2020 18:20   DG Chest Portable 1 View  Result Date: 04/21/2020 CLINICAL DATA:  70 year old female with weakness, syncope, wide complex bradycardia. EXAM: PORTABLE CHEST 1 VIEW COMPARISON:  Portable  chest 09/18/2018 and earlier. FINDINGS: Portable AP semi upright view at 0900 hours. Pacer/resuscitation pads project over the left chest. Larger lung volumes. Mediastinal contours are stable and within normal limits. Visualized tracheal air column is within normal limits. Allowing for portable technique the lungs are clear. No pneumothorax identified, linear external artifact projecting over the outer right lung. No acute osseous abnormality identified. IMPRESSION: No acute cardiopulmonary abnormality. Electronically Signed   By: Genevie Ann M.D.   On: 04/21/2020 09:08   ECHOCARDIOGRAM COMPLETE  Result Date: 04/29/2020    ECHOCARDIOGRAM REPORT   Patient Name:   [name] [place] Date of Exam: 04/29/2020 Medical Rec #:  [id number]            Height:       67.0 in Accession #:    [id number]           Weight:       132.5 lb Date of Birth:  [birth year]           BSA:          1.698 m Patient Age:    74 years             BP:           109/61 mmHg Patient Gender: F                    HR:           52 bpm. Exam Location:  Inpatient Procedure: 2D Echo and Intracardiac Opacification Agent STAT ECHO Indications:    CHF; Needs echo before Pacemaker placement.  History:        Patient has prior history of Echocardiogram examinations, most                 recent 04/25/2020. CHF, Prior CABG, Stroke; Risk                 Factors:Hypertension and Dyslipidemia. Sickle Cell Anemia.  Sonographer:    Darlina Sicilian RDCS Referring Phys: [place]  1. Left ventricular ejection fraction, by estimation, is 30 to 35%. The left ventricle has moderately decreased function. The left ventricle demonstrates regional wall motion abnormalities (see scoring diagram/findings for description). Inferior/lateral  akinesis. Left ventricular diastolic parameters are indeterminate.  2. Right ventricular systolic function is severely reduced. The right ventricular size is normal. There is normal pulmonary artery systolic pressure.  3.  The mitral valve is normal in structure. Trivial mitral valve regurgitation.  4. The aortic valve is tricuspid. Aortic valve regurgitation is moderate. Mild aortic valve sclerosis is present, with no evidence of aortic valve stenosis. FINDINGS  Left Ventricle: Left ventricular ejection fraction, by estimation, is 30 to 35%. The left ventricle has moderately decreased function. The left ventricle demonstrates regional wall motion abnormalities. Definity contrast agent was given IV to delineate the left ventricular endocardial borders. The left ventricular internal cavity size was normal in size. There  is no left ventricular hypertrophy. Left ventricular diastolic parameters are indeterminate.  LV Wall Scoring: The entire lateral wall and entire inferior wall are akinetic. The apex is hypokinetic. The entire anterior wall and entire septum are normal. Right Ventricle: The right ventricular size is normal. Right vetricular wall thickness was not well visualized. Right ventricular systolic function is severely reduced. There is normal pulmonary artery systolic pressure. The tricuspid regurgitant velocity is 2.43 m/s, and with an assumed right atrial pressure of 8 mmHg, the estimated right ventricular systolic pressure is 09.6 mmHg. Left Atrium: Left atrial size was normal in size. Right Atrium: Right atrial size was normal in size. Pericardium: There is no evidence of pericardial effusion. Mitral Valve: The mitral valve is normal in structure. Trivial mitral valve regurgitation. Tricuspid Valve: The tricuspid valve is normal in structure. Tricuspid valve regurgitation is trivial. Aortic Valve: The aortic valve is tricuspid. Aortic valve regurgitation is moderate. Aortic regurgitation PHT measures 630 msec. Mild aortic valve sclerosis is present, with no evidence of aortic valve stenosis. Pulmonic Valve: The pulmonic valve was not well visualized. Pulmonic valve regurgitation is not visualized. Aorta: The aortic root and  ascending aorta are structurally normal, with no evidence of dilitation. IAS/Shunts: The interatrial septum was not well visualized.  LEFT VENTRICLE PLAX 2D LVIDd:         3.30 cm      Diastology LVIDs:         3.10 cm      LV e' medial:    7.18 cm/s LV PW:         1.00 cm      LV E/e' medial:  14.3 LV IVS:        1.00 cm      LV e' lateral:   6.69 cm/s LVOT diam:     1.90 cm      LV E/e' lateral: 15.4 LV SV:         47 LV SV Index:   28 LVOT Area:     2.84 cm  LV Volumes (MOD) LV vol d, MOD A2C: 117.0 ml LV vol d, MOD A4C: 163.0 ml LV vol s, MOD A2C: 82.6 ml LV vol s, MOD A4C: 110.0 ml LV SV MOD A2C:     34.4 ml LV SV MOD A4C:     163.0 ml LV SV MOD BP:      48.8 ml RIGHT VENTRICLE RV S prime:     5.66 cm/s LEFT ATRIUM             Index       RIGHT ATRIUM           Index LA diam:        1.50 cm 0.88 cm/m  RA Area:     11.20 cm LA Vol (A2C):   30.1 ml 17.73 ml/m RA Volume:   25.60 ml  15.08 ml/m LA Vol (A4C):   24.7 ml 14.55 ml/m LA Biplane Vol: 28.4 ml 16.73 ml/m  AORTIC VALVE LVOT Vmax:   105.00 cm/s LVOT Vmean:  61.400 cm/s LVOT VTI:    0.165 m AI PHT:      630 msec  AORTA Ao Root diam: 3.00 cm Ao Asc diam:  2.70 cm MITRAL VALVE                TRICUSPID VALVE MV Area (PHT): 6.48 cm     TR Peak grad:   23.6 mmHg MV Decel Time: 117 msec  TR Vmax:        243.00 cm/s MV E velocity: 103.00 cm/s MV A velocity: 82.50 cm/s   SHUNTS MV E/A ratio:  1.25         Systemic VTI:  0.16 m                             Systemic Diam: 1.90 cm Oswaldo Milian MD Electronically signed by Oswaldo Milian MD Signature Date/Time: 04/29/2020/9:55:55 AM    Final    ECHOCARDIOGRAM COMPLETE  Result Date: 04/21/2020    ECHOCARDIOGRAM REPORT   Patient Name:   [name] [place] Date of Exam: 04/21/2020 Medical Rec #:  [id number]            Height:       67.0 in Accession #:    [id number]           Weight:       125.7 lb Date of Birth:  May 26, 1950           BSA:          1.660 m Patient Age:    57 years             BP:            127/71 mmHg Patient Gender: F                    HR:           80 bpm. Exam Location:  Inpatient Procedure: 2D Echo, Cardiac Doppler and Color Doppler Indications:    I50.23 Acute on chronic systolic (congestive) heart failure  History:        Patient has prior history of Echocardiogram examinations, most                 recent 09/19/2018. CHF, Stroke; Risk Factors:Hypertension and                 Dyslipidemia. Seizures. Sickle Cell anemia.  Sonographer:    Jonelle Sidle Dance Referring Phys: [place]  1. Left ventricular ejection fraction, by estimation, is 35 to 40%. The left ventricle has moderately decreased function. The left ventricle demonstrates regional wall motion abnormalities (see scoring diagram/findings for description). Left ventricular  diastolic parameters are consistent with Grade I diastolic dysfunction (impaired relaxation). Elevated left ventricular end-diastolic pressure. There is severe hypokinesis of the left ventricular, entire inferior wall and lateral wall.  2. Right ventricular systolic function is normal. The right ventricular size is normal. There is normal pulmonary artery systolic pressure.  3. Left atrial size was mildly dilated.  4. Right atrial size was mildly dilated.  5. The mitral valve is grossly normal. Trivial mitral valve regurgitation.  6. The aortic valve is tricuspid. There is mild calcification of the aortic valve. There is mild thickening of the aortic valve. Aortic valve regurgitation is moderate. Mild to moderate aortic valve sclerosis/calcification is present, without any evidence of aortic stenosis.  7. The inferior vena cava is normal in size with greater than 50% respiratory variability, suggesting right atrial pressure of 3 mmHg. Comparison(s): No significant change from prior study. FINDINGS  Left Ventricle: Left ventricular ejection fraction, by estimation, is 35 to 40%. The left ventricle has moderately decreased function. The left  ventricle demonstrates regional wall motion abnormalities. Severe hypokinesis of the left ventricular, entire  inferior wall and lateral wall. The left ventricular internal cavity size was normal in  size. There is no left ventricular hypertrophy. Abnormal (paradoxical) septal motion, consistent with RV pacemaker. Left ventricular diastolic parameters are consistent with Grade I diastolic dysfunction (impaired relaxation). Elevated left ventricular end-diastolic pressure. Right Ventricle: The right ventricular size is normal. No increase in right ventricular wall thickness. Right ventricular systolic function is normal. There is normal pulmonary artery systolic pressure. The tricuspid regurgitant velocity is 2.13 m/s, and  with an assumed right atrial pressure of 3 mmHg, the estimated right ventricular systolic pressure is 50.2 mmHg. Left Atrium: Left atrial size was mildly dilated. Right Atrium: Right atrial size was mildly dilated. Pericardium: There is no evidence of pericardial effusion. Mitral Valve: The mitral valve is grossly normal. There is mild thickening of the mitral valve leaflet(s). There is mild calcification of the mitral valve leaflet(s). Trivial mitral valve regurgitation. Tricuspid Valve: The tricuspid valve is normal in structure. Tricuspid valve regurgitation is trivial. Aortic Valve: The aortic valve is tricuspid. There is mild calcification of the aortic valve. There is mild thickening of the aortic valve. Aortic valve regurgitation is moderate. Aortic regurgitation PHT measures 351 msec. Mild to moderate aortic valve sclerosis/calcification is present, without any evidence of aortic stenosis. Pulmonic Valve: The pulmonic valve was not well visualized. Pulmonic valve regurgitation is not visualized. Aorta: The aortic root, ascending aorta and aortic arch are all structurally normal, with no evidence of dilitation or obstruction. Venous: The inferior vena cava is normal in size with greater than  50% respiratory variability, suggesting right atrial pressure of 3 mmHg. IAS/Shunts: The atrial septum is grossly normal. Additional Comments: A device lead is visualized.  LEFT VENTRICLE PLAX 2D LVIDd:         3.50 cm  Diastology LVIDs:         2.50 cm  LV e' medial:    4.03 cm/s LV PW:         1.20 cm  LV E/e' medial:  19.0 LV IVS:        1.00 cm  LV e' lateral:   3.50 cm/s LVOT diam:     1.80 cm  LV E/e' lateral: 21.9 LV SV:         32 LV SV Index:   19 LVOT Area:     2.54 cm                          3D Volume EF:                         3D EF:        37 % RIGHT VENTRICLE            IVC RV Basal diam:  2.80 cm    IVC diam: 2.00 cm RV S prime:     9.99 cm/s TAPSE (M-mode): 1.9 cm LEFT ATRIUM             Index       RIGHT ATRIUM           Index LA diam:        2.70 cm 1.63 cm/m  RA Area:     13.20 cm LA Vol (A2C):   46.6 ml 28.08 ml/m RA Volume:   29.90 ml  18.01 ml/m LA Vol (A4C):   33.7 ml 20.30 ml/m LA Biplane Vol: 39.7 ml 23.92 ml/m  AORTIC VALVE LVOT Vmax:   73.80 cm/s LVOT Vmean:  45.300 cm/s LVOT VTI:  0.127 m AI PHT:      351 msec  AORTA Ao Root diam: 3.10 cm Ao Asc diam:  3.00 cm MITRAL VALVE                TRICUSPID VALVE MV Area (PHT): 3.77 cm     TR Peak grad:   18.1 mmHg MV Decel Time: 201 msec     TR Vmax:        213.00 cm/s MV E velocity: 76.70 cm/s MV A velocity: 108.00 cm/s  SHUNTS MV E/A ratio:  0.71         Systemic VTI:  0.13 m                             Systemic Diam: 1.80 cm Buford Dresser MD Electronically signed by Buford Dresser MD Signature Date/Time: 04/21/2020/9:06:37 PM    Final    ECHO INTRAOPERATIVE TEE  Result Date: 04/25/2020  *INTRAOPERATIVE TRANSESOPHAGEAL REPORT *  Patient Name:   [name] [place] Date of Exam: 04/25/2020 Medical Rec #:  [id number]            Height:       67.0 in Accession #:    [id number]           Weight:       119.7 lb Date of Birth:  [birth year]           BSA:          1.63 m Patient Age:    38 years             BP:            113/55 mmHg Patient Gender: F                    HR:           76 bpm. Exam Location:  Anesthesiology Transesophogeal exam was perform intraoperatively during surgical procedure. Patient was closely monitored under general anesthesia during the entirety of examination. Indications:     Coronary Artery Disease Sonographer:     Bernadene Person RDCS Performing Phys: Teague Diagnosing Phys: Annye Asa MD PROCEDURE: Intraoperative Transesophogeal Teeth intact after easy TEE probe insertion. Complications: No known complications during this procedure. POST-OP IMPRESSIONS Limited Post CPB exam: The patient separated from CPB easily with low MIlrinone support. - Left Ventricle: The overall left ventricular is essentially unchanged from pre-bypass images. There is some interval improvement in the basal region, and also in that infero-septal wall. Overall EF 45-50%. - Right Ventricle: The right ventricular function appears unchanged from pre-bypass images. - Aortic Valve: The aortic valve function appears unchanged from pre-bypass images. Mild, anterior directed AI is present. - Mitral Valve: The mitral valve function appears unchanged from pre-bypass images. - Tricuspid Valve: The tricuspid valve function appears unchanged from pre-bypass images. PRE-OP FINDINGS  Left Ventricle: The left ventricle has mild-moderately reduced systolic function, with an ejection fraction of 40-45%, measured 40%. The cavity size was mildly dilated. Left ventricular has diffuse hypokinesis, with Severe hypokinesis of the Basal inferoseptal wall and entire basal region. There is no left ventricular hypertrophy. Left ventricular diastolic function was not evaluated. Right Ventricle: The right ventricle has normal systolic function. The cavity was normal. There is no increase in right ventricular wall thickness. Left Atrium: Left atrial size was normal in size. No left atrial/left atrial appendage thrombus was detected. Left  atrial appendage velocity  is normal at greater than 40 cm/s. Right Atrium: Right atrial size was normal in size. Catheter present traversing through the right atrium. Interatrial Septum: No atrial level shunt detected by color flow Doppler. There is left bowing of the interatrial septum, suggestive of elevated right atrial pressure. There is no evidence of a patent foramen ovale by color doppler interrogation. Pericardium: There is no evidence of pericardial effusion. Mitral Valve: The mitral valve is normal in structure. Mitral valve regurgitation is trivial by color flow Doppler. There is no evidence of mitral valve vegetation. There is no evidence of mitral stenosis, with peak gradient 3 mmHg, mean gradient 1 mmHg. Tricuspid Valve: The tricuspid valve was normal in structure. Tricuspid valve regurgitation is trivial by color flow Doppler. No evidence of tricuspid stenosis is present. There is no evidence of tricuspid valve vegetation. Aortic Valve: The aortic valve is tricuspid. Aortic valve regurgitation is mild by color flow Doppler. The jet is anteriorly-directed toward the anterior leaflet of the mitral valve. There is no stenosis of the aortic valve, with peak gradient 8 mmHg, mean gradient 4 mmHg. There is no evidence of aortic valve vegetation. Pulmonic Valve: The pulmonic valve was normal in structure, with normal leaflet excursion. No evidence of pulmonic stenosis. Pulmonic valve regurgitation is trivial, around the PA catheter, by color flow Doppler. Aorta: There is evidence of calcification at the Sinotubular ridge and in the proximal ascending aorta. There is scattered moderate plaque in the aortic arch and descending aortic.Marland Kitchen Pulmonary Artery: Gordy Councilman catheter present on the right. The pulmonary artery is of normal size. Venous: The inferior vena cava is normal in size with less than 50% respiratory variability, suggesting right atrial pressure of 8 mmHg. Shunts: There is no evidence of an atrial  septal defect. +-------------+--------++ AORTIC VALVE          +-------------+--------++ AV Mean Grad:4.0 mmHg +-------------+--------++ +-------------+--------++ MITRAL VALVE          +-------------+--------++ MV Mean grad:1.0 mmHg +-------------+--------++  Annye Asa MD Electronically signed by Annye Asa MD Signature Date/Time: 04/25/2020/5:27:32 PM    Final    VAS US DOPPLER PRE CABG  Result Date: 04/25/2020 PREOPERATIVE VASCULAR EVALUATION  Indications:      Pre-CABG. 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 Other Factors:    Sickle Cell Disease, CHF, seizure disorder. Limitations:      Right arm is contracted Comparison Study: No prior studies Performing Technologist: Sharion Dove RVS  Examination Guidelines: A complete evaluation includes B-mode imaging, spectral Doppler, color Doppler, and power Doppler as needed of all accessible portions of each vessel. Bilateral testing is considered an integral part of a complete examination. Limited examinations for reoccurring indications may be performed as noted.  Right Carotid Findings: +----------+--------+--------+--------+------------+--------+           PSV cm/sEDV cm/sStenosisDescribe    Comments +----------+--------+--------+--------+------------+--------+ CCA Prox  85      14                                   +----------+--------+--------+--------+------------+--------+ CCA Distal75      20              heterogenous         +----------+--------+--------+--------+------------+--------+ ICA Prox  96  17      1-39%   heterogenous         +----------+--------+--------+--------+------------+--------+ ICA Distal91      28                                   +----------+--------+--------+--------+------------+--------+ ECA       80      11                                    +----------+--------+--------+--------+------------+--------+ Portions of this table do not appear on this page. +----------+--------+-------+--------+------------+           PSV cm/sEDV cmsDescribeArm Pressure +----------+--------+-------+--------+------------+ Subclavian95                                  +----------+--------+-------+--------+------------+ +---------+--------+--+--------+--+ VertebralPSV cm/s75EDV cm/s12 +---------+--------+--+--------+--+ Left Carotid Findings: +----------+--------+--------+--------+----------------------+---------+           PSV cm/sEDV cm/sStenosisDescribe              Comments  +----------+--------+--------+--------+----------------------+---------+ CCA Prox  79      12              heterogenous                    +----------+--------+--------+--------+----------------------+---------+ CCA Distal80      9               irregular and calcific          +----------+--------+--------+--------+----------------------+---------+ ICA Prox  124     21      1-39%   heterogenous          Shadowing +----------+--------+--------+--------+----------------------+---------+ ICA Distal85      22                                              +----------+--------+--------+--------+----------------------+---------+ ECA       87      15                                              +----------+--------+--------+--------+----------------------+---------+ +----------+--------+--------+--------+------------+ SubclavianPSV cm/sEDV cm/sDescribeArm Pressure +----------+--------+--------+--------+------------+           90      1                            +----------+--------+--------+--------+------------+ +---------+--------+--+--------+--+ VertebralPSV cm/s90EDV cm/s13 +---------+--------+--+--------+--+  ABI Findings: +--------+------------------+-----+------------------+--------------+ Right   Rt Pressure  (mmHg)IndexWaveform          Comment        +--------+------------------+-----+------------------+--------------+ [id number]                                      contracted arm +--------+------------------+-----+------------------+--------------+ PTA     254               2.07 monophasic                       +--------+------------------+-----+------------------+--------------+  DP                             unable to insonate               +--------+------------------+-----+------------------+--------------+ +--------+------------------+-----+-----------+-------+ Left    Lt Pressure (mmHg)IndexWaveform   Comment +--------+------------------+-----+-----------+-------+ [id number]                    multiphasic        +--------+------------------+-----+-----------+-------+ PTA     112               0.91 monophasic         +--------+------------------+-----+-----------+-------+ DP      64                0.52 monophasic         +--------+------------------+-----+-----------+-------+ +-------+---------------+----------------+ ABI/TBIToday's ABI/TBIPrevious ABI/TBI +-------+---------------+----------------+ Right  2.07                            +-------+---------------+----------------+ Left   0.91                            +-------+---------------+----------------+  Right Doppler Findings: +-----------+--------+-----+-------+--------------+ Site       PressureIndexDopplerComments       +-----------+--------+-----+-------+--------------+ Brachial   123                 contracted arm +-----------+--------+-----+-------+--------------+ Radial                         contracted arm +-----------+--------+-----+-------+--------------+ Ulnar                          contracted arm +-----------+--------+-----+-------+--------------+ Palmar Arch                    contracted arm +-----------+--------+-----+-------+--------------+  Left  Doppler Findings: +--------+--------+-----+-----------+--------+ Site    PressureIndexDoppler    Comments +--------+--------+-----+-----------+--------+ [id number]          multiphasic         +--------+--------+-----+-----------+--------+ Radial               multiphasic         +--------+--------+-----+-----------+--------+ Ulnar                multiphasic         +--------+--------+-----+-----------+--------+  Summary: Right Carotid: Velocities in the right ICA are consistent with a 1-39% stenosis. Left Carotid: Velocities in the left ICA are consistent with a 1-39% stenosis. Vertebrals:  Bilateral vertebral arteries demonstrate antegrade flow. Subclavians: Normal flow hemodynamics were seen in bilateral subclavian              arteries. Right ABI: Resting right ankle-brachial index indicates noncompressible right lower extremity arteries. Left ABI: Resting left ankle-brachial index indicates mild left lower extremity arterial disease. Right Upper Extremity: Patient has contracted arm, unable to perform testing Left Upper Extremity: Doppler waveforms decrease <50% w left radial compression. Doppler waveform obliterate with left ulnar compression.  Electronically signed by Ruta Hinds MD on 04/25/2020 at 3:02:47 PM.    Final    [place] EKG SITE RITE  Result Date: 04/30/2020 If Site Rite image not attached, placement could not be confirmed due to current cardiac rhythm.   Assessment/Plan      Family/ staff Communication:  Labs/tests ordered:    Goals of care:      Durenda Age, DNP, MSN, FNP-BC [facility] [phone] (Monday-Friday 8:00 a.m. - 5:00 p.m.) [phone] (after hours)

## 2020-05-10 ENCOUNTER — Ambulatory Visit: Payer: Medicare HMO

## 2020-05-10 ENCOUNTER — Encounter: Payer: Self-pay | Admitting: Internal Medicine

## 2020-05-10 ENCOUNTER — Non-Acute Institutional Stay (SKILLED_NURSING_FACILITY): Payer: Medicare HMO | Admitting: Internal Medicine

## 2020-05-10 DIAGNOSIS — I442 Atrioventricular block, complete: Secondary | ICD-10-CM

## 2020-05-10 DIAGNOSIS — Z7901 Long term (current) use of anticoagulants: Secondary | ICD-10-CM

## 2020-05-10 DIAGNOSIS — I5042 Chronic combined systolic (congestive) and diastolic (congestive) heart failure: Secondary | ICD-10-CM | POA: Diagnosis not present

## 2020-05-10 DIAGNOSIS — D62 Acute posthemorrhagic anemia: Secondary | ICD-10-CM

## 2020-05-10 DIAGNOSIS — I2510 Atherosclerotic heart disease of native coronary artery without angina pectoris: Secondary | ICD-10-CM | POA: Diagnosis not present

## 2020-05-10 NOTE — Progress Notes (Signed)
NURSING HOME LOCATION:  [facility]  ROOM NUMBER:  [id number]  CODE STATUS:    PCP:  Huston Foley Just FNP  This is a comprehensive admission note to this SNFperformed on this date less than 30 days from date of admission. Included are preadmission medical/surgical history; reconciled medication list; family history; social history and comprehensive review of systems.  Corrections and additions to the records were documented. Comprehensive physical exam was also performed. Additionally a clinical summary was entered for each active diagnosis pertinent to this admission in the Problem List to enhance continuity of care.  HPI: Patient was hospitalized 3/31 - 05/05/2020, admitted after her husband witnessed what he thought was a seizure.  This was described as her making gurgling sounds and slumping into a chair.  EMS arrived and found her in complete heart block.  In route to the ED she was percutaneously paced.  In the ED a transvenous lead was placed emergently.  Digoxin and carvedilol were held.  Once admitted she exhibited one-to-one conduction.  Heart failure team recommended cath which demonstrated 70% distal left main coronary artery stenosis and 70% stenosis in the proximal circumflex.  Echo documented an EF of 35%.  Moderate aortic valve sclerosis was present without significant stenosis. CABG x2 was performed utilizing LIMA to LAD and SVG to OM on 4/4.  Endoscopic harvest of the greater saphenous vein from the right thigh was completed. BIV pacemaker was placed on 4/8. Surgical interventions were associated with acute blood loss anemia as expected; which stabilized post op. On 4/14 H/H were 9.4/28.5. Colchicine was initiated for gout type symptoms. She was discharged on warfarin anticoagulation with INR goal of 2-3.  Lovenox was continued for DVT prophylaxis. PT/OT recommended SNF placement for rehab.    Past medical and surgical history: Includes history of vitamin D  deficiency, history of stroke with aphasia and right hemiplegia, sickle cell anemia, history of seizures, essential hypertension, dyslipidemia, history of adenomatous polyp of the colon and history of congestive heart failure in the context of cardiomyopathy. Surgeries and procedures include abdominal hysterectomy and tubal ligation.  Social history: Nondrinker.  Former smoker.  Family history: Reviewed   Review of systems: Aphasia precluded completing review of systems.  Physical exam:  Pertinent or positive findings: Appears chronically ill. As noted she is aphasic and cannot enunciate any discernible vocabulary.  She also had difficulty following commands even though it is reported that she was independent at home.  Staff reports that she seems anxious.  They also reported that she needs a lift to move her from the bed to the wheelchair. Slight exotropia is present in the left eye.  She is missing the anterior maxillary teeth.  There is a slight gallop cadence to her heart rhythm with accentuated first and second heart sounds.  Breath sounds are decreased.  CABG operative scar is healing well.  The pedal pulses are decreased.  Posterior tibial pulses seem slightly stronger than dorsalis pedis pulses.  The legs are atrophic.  There is no range of motion in the right upper and lower extremities.  The right arm was flexed across the chest and the hand is an a protective glove or splint.  She was also weak to opposition in the left upper extremity.  She would not oppose my hand to test strength in the left lower extremity.  General appearance:  no acute distress, increased work of breathing is present.   Lymphatic: No lymphadenopathy about the head, neck, axilla. Eyes:  No conjunctival inflammation or lid edema is present. There is no scleral icterus. Ears:  External ear exam shows no significant lesions or deformities.   Nose:  External nasal examination shows no deformity or inflammation. Nasal  mucosa are pink and moist without lesions, exudates Neck:  No thyromegaly, masses, tenderness noted.    Heart:  No murmur, click, rub.  Lungs:  without wheezes, rhonchi, rales, rubs. Abdomen: Bowel sounds are normal.  Abdomen is soft and nontender with no organomegaly, hernias, masses. GU: Deferred  Extremities:  No cyanosis, clubbing, edema. Neurologic exam:Balance, Rhomberg, finger to nose testing could not be completed due to clinical state Skin: Warm & dry w/o tenting. No significant lesions or rash.  See clinical summary under each active problem in the Problem List with associated updated therapeutic plan

## 2020-05-10 NOTE — Assessment & Plan Note (Signed)
On warfarin, monitored for bleeding at [facility]

## 2020-05-10 NOTE — Assessment & Plan Note (Signed)
No angina @ present

## 2020-05-10 NOTE — Assessment & Plan Note (Addendum)
PT/INR goal = 2.0-3.0 05/10/20 INR 2.2 on 4 mg qd

## 2020-05-10 NOTE — Patient Instructions (Signed)
See assessment and plan under each diagnosis in the problem list and acutely for this visit 

## 2020-05-10 NOTE — Assessment & Plan Note (Signed)
Clinically stable. 

## 2020-05-10 NOTE — Assessment & Plan Note (Addendum)
Current EF 35% No NVD,edema or rales on exam

## 2020-05-11 ENCOUNTER — Telehealth (HOSPITAL_COMMUNITY): Payer: Self-pay | Admitting: Cardiology

## 2020-05-11 ENCOUNTER — Ambulatory Visit: Payer: Medicare HMO | Admitting: Internal Medicine

## 2020-05-11 ENCOUNTER — Telehealth (HOSPITAL_COMMUNITY): Payer: Self-pay | Admitting: Vascular Surgery

## 2020-05-11 NOTE — Telephone Encounter (Signed)
Pt need a hosp f/u appt w/DM, call facility to make an appt, speak with Apanza [phone], Thanks

## 2020-05-11 NOTE — Telephone Encounter (Signed)
Left VM  At[facility] @ [facility] to make pt hosp f/u w/ [facility] 4/22 @ 12, asked them to call back to [facility]

## 2020-05-12 ENCOUNTER — Ambulatory Visit (INDEPENDENT_AMBULATORY_CARE_PROVIDER_SITE_OTHER): Payer: Medicare HMO | Admitting: Emergency Medicine

## 2020-05-12 ENCOUNTER — Other Ambulatory Visit: Payer: Self-pay

## 2020-05-12 DIAGNOSIS — I442 Atrioventricular block, complete: Secondary | ICD-10-CM | POA: Diagnosis not present

## 2020-05-12 LAB — CUP PACEART INCLINIC DEVICE CHECK
Battery Remaining Longevity: 32 mo
Battery Voltage: 2.98 V
Brady Statistic RA Percent Paced: 0 %
Brady Statistic RV Percent Paced: 99.93 %
Date Time Interrogation Session: 20220421154440
Implantable Lead Implant Date: 20220411
Implantable Lead Implant Date: 20220411
Implantable Lead Implant Date: 20220411
Implantable Lead Location: 753858
Implantable Lead Location: 753859
Implantable Lead Location: 753860
Implantable Pulse Generator Implant Date: 20220411
Lead Channel Impedance Value: 312.5 Ohm
Lead Channel Impedance Value: 412.5 Ohm
Lead Channel Impedance Value: 550 Ohm
Lead Channel Pacing Threshold Amplitude: 0.75 V
Lead Channel Pacing Threshold Amplitude: 1 V
Lead Channel Pacing Threshold Amplitude: 2 V
Lead Channel Pacing Threshold Pulse Width: 0.5 ms
Lead Channel Pacing Threshold Pulse Width: 0.5 ms
Lead Channel Pacing Threshold Pulse Width: 0.5 ms
Lead Channel Sensing Intrinsic Amplitude: 3.1 mV
Lead Channel Setting Pacing Amplitude: 2.875
Lead Channel Setting Pacing Amplitude: 3.5 V
Lead Channel Setting Pacing Amplitude: 5 V
Lead Channel Setting Pacing Pulse Width: 0.5 ms
Lead Channel Setting Pacing Pulse Width: 0.5 ms
Lead Channel Setting Sensing Sensitivity: 4 mV
Pulse Gen Model: 3562
Pulse Gen Serial Number: 6262141

## 2020-05-12 LAB — CBC AND DIFFERENTIAL
HCT: 32 — AB (ref 36–46)
Hemoglobin: 10.6 — AB (ref 12.0–16.0)
Platelets: 643 — AB (ref 150–399)
WBC: 9.1

## 2020-05-12 LAB — BASIC METABOLIC PANEL
BUN: 20 (ref 4–21)
CO2: 21 (ref 13–22)
Chloride: 104 (ref 99–108)
Creatinine: 0.7 (ref 0.5–1.1)
Glucose: 102
Potassium: 4.3 (ref 3.4–5.3)
Sodium: 138 (ref 137–147)

## 2020-05-12 LAB — COMPREHENSIVE METABOLIC PANEL
Calcium: 9.2 (ref 8.7–10.7)
GFR calc Af Amer: >90
GFR calc non Af Amer: 88.95

## 2020-05-12 LAB — CBC: RBC: 3.79 — AB (ref 3.87–5.11)

## 2020-05-12 NOTE — Progress Notes (Signed)
Wound check appointment. Steri-strips removed. Wound without redness or edema. Incision edges approximated, wound well healed. Normal device function. RA/RV threshold, RA/RV/LV sensing, and impedances consistent with implant measurements. LV threshold increased from 1.1V@0 .91ms at implant to 3.5V@0 .35ms. Industry consulted after threshold remained 2.5V@1 .58ms.  Vectors tested yielding phrenic nerve stimulation from 1 to 2. Best threshold of 2.0@0 .5 achieved with 2-Can programming. No phrenic nerve stimulation noted. LV cap confirm suggested and turned on. At implant device programmed at 3.5V/5.0V for RA/RV safety margins for newly implanted leads. Histogram distribution appropriate for patient and level of activity. No mode switches or high ventricular rates noted. New order received for chest x-ray per Dr. Curt Bears. Patient/spouse/SNF educated about wound care, arm mobility, lifting restrictions and order for CXR. Spoke with Ranata Daughtry, RN DON at [facility] who took a verbal for portable CXR. Results will be faxed to [phone] within 24 hours. Confirmed receipt of remote transmissions. Next 08/01/20. ROV with Dr. Curt Bears 08/09/20. Error in saving all of attachment.

## 2020-05-12 NOTE — Patient Instructions (Signed)
Please refer to "After your pacemaker" guidelines. If you have any questions please give [place] a call at [phone].

## 2020-05-13 ENCOUNTER — Ambulatory Visit (HOSPITAL_COMMUNITY)
Admission: RE | Admit: 2020-05-13 | Discharge: 2020-05-13 | Disposition: A | Discharge status: Home / Self Care | Payer: Medicare HMO | Source: Ambulatory Visit | Attending: Cardiology | Admitting: Cardiology

## 2020-05-13 ENCOUNTER — Non-Acute Institutional Stay (SKILLED_NURSING_FACILITY): Payer: Medicare HMO | Admitting: Adult Health

## 2020-05-13 ENCOUNTER — Encounter: Payer: Self-pay | Admitting: Adult Health

## 2020-05-13 ENCOUNTER — Encounter (HOSPITAL_COMMUNITY): Payer: Self-pay | Admitting: Cardiology

## 2020-05-13 VITALS — BP 110/60 | HR 95

## 2020-05-13 DIAGNOSIS — G40909 Epilepsy, unspecified, not intractable, without status epilepticus: Secondary | ICD-10-CM | POA: Insufficient documentation

## 2020-05-13 DIAGNOSIS — Z7901 Long term (current) use of anticoagulants: Secondary | ICD-10-CM | POA: Diagnosis not present

## 2020-05-13 DIAGNOSIS — Z87891 Personal history of nicotine dependence: Secondary | ICD-10-CM | POA: Diagnosis not present

## 2020-05-13 DIAGNOSIS — Z8673 Personal history of transient ischemic attack (TIA), and cerebral infarction without residual deficits: Secondary | ICD-10-CM | POA: Diagnosis not present

## 2020-05-13 DIAGNOSIS — I5042 Chronic combined systolic (congestive) and diastolic (congestive) heart failure: Secondary | ICD-10-CM | POA: Diagnosis not present

## 2020-05-13 DIAGNOSIS — I255 Ischemic cardiomyopathy: Secondary | ICD-10-CM | POA: Diagnosis not present

## 2020-05-13 DIAGNOSIS — I2510 Atherosclerotic heart disease of native coronary artery without angina pectoris: Secondary | ICD-10-CM | POA: Insufficient documentation

## 2020-05-13 DIAGNOSIS — R4701 Aphasia: Secondary | ICD-10-CM | POA: Insufficient documentation

## 2020-05-13 DIAGNOSIS — Z8679 Personal history of other diseases of the circulatory system: Secondary | ICD-10-CM | POA: Diagnosis not present

## 2020-05-13 DIAGNOSIS — Z7982 Long term (current) use of aspirin: Secondary | ICD-10-CM | POA: Diagnosis not present

## 2020-05-13 DIAGNOSIS — Z951 Presence of aortocoronary bypass graft: Secondary | ICD-10-CM | POA: Insufficient documentation

## 2020-05-13 DIAGNOSIS — J189 Pneumonia, unspecified organism: Secondary | ICD-10-CM

## 2020-05-13 DIAGNOSIS — Z79899 Other long term (current) drug therapy: Secondary | ICD-10-CM | POA: Insufficient documentation

## 2020-05-13 LAB — BASIC METABOLIC PANEL
Anion gap: 13 (ref 5–15)
BUN: 19 mg/dL (ref 8–23)
CO2: 17 mmol/L — ABNORMAL LOW (ref 22–32)
Calcium: 8.9 mg/dL (ref 8.9–10.3)
Chloride: 107 mmol/L (ref 98–111)
Creatinine, Ser: 0.78 mg/dL (ref 0.44–1.00)
GFR, Estimated: >60 mL/min (ref >60)
Glucose, Bld: 96 mg/dL (ref 70–99)
Potassium: 3.9 mmol/L (ref 3.5–5.1)
Sodium: 137 mmol/L (ref 135–145)

## 2020-05-13 LAB — DIGOXIN LEVEL: Digoxin Level: <0.2 ng/mL — ABNORMAL LOW (ref 0.8–2.0)

## 2020-05-13 MED ORDER — METOPROLOL SUCCINATE ER 25 MG PO TB24: 25.0000 mg | ORAL_TABLET | Freq: Every day | ORAL | 2 refills | Status: DC

## 2020-05-13 NOTE — Progress Notes (Signed)
Location:  [place] Room Number: 213/A Place of Service:  SNF (31) Provider:  Durenda Age, DNP, FNP-BC  Patient Care Team: Just, [name], FNP as PCP - General (Family Medicine) Skeet Latch, MD as PCP - Cardiology (Cardiology) Adrian Prows, MD as Attending Physician (Cardiology) Burnard Bunting, MD (Internal Medicine)  Extended Emergency Contact Information Primary Emergency Contact: Minahan,[name] Address: [address]          [place], [place] [address] [name] of [place] Phone: [phone] Mobile Phone: [phone] Relation: Spouse Secondary Emergency Contact: Justice Deeds          [place], [place] [address] [name] of [place] Phone: [phone] Mobile Phone: [phone] Relation: Son  Code Status:  Full Code  Goals of care: Advanced Directive information Advanced Directives 05/13/2020  Does Patient Have a Medical Advance Directive? No  Type of Advance Directive -  Does patient want to make changes to medical advance directive? -  Copy of [place] in Chart? -  Would patient like information on creating a medical advance directive? No - Patient declined  Pre-existing out of facility DNR order (yellow form or pink MOST form) -     Chief Complaint  Patient presents with  . Acute Visit    Abnormal Chest X-ray    HPI:  Pt is a 70 y.o. female seen today for chest x-ray  Showing moderate bilateral lower lobe pneumonia.  No reported coughing nor fever. She was admitted to [place] on 05/06/19 post [facility] admission 04/21/20 to 05/05/20 for complete heart block.  She underwent CABG x2 utilizing LIMA to LAD and OM on 04/25/2020.  She underwent endoscopic harvest of her greater saphenous vein from her right thigh.  She is currently taking Coumadin 4 mg in the evening for her history of CVA.  INR goal 2-3.  PPM placement done on 04/29/2020.  She is currently having short-term rehab at  [facility].    Past Medical History:  Diagnosis Date  . Adenomatous polyp of colon 09/2012   repeat colonoscopy in 5 years by Dr. Sharlett Iles  . CHF (congestive heart failure) (HCC)    EF 15-20% as of 05/28/11 (Dr Legrand [facility] D/C summary)  . CVA (cerebral infarction) 12/2007  . Hemiparesis ([place])   . Hyperlipidemia   . Hypertension   . Seizures ([place])   . Sickle cell anemia (HCC)   . Stroke ([place])   . Vitamin D deficiency 2010   Past Surgical History:  Procedure Laterality Date  . ABDOMINAL HYSTERECTOMY    . BIV PACEMAKER INSERTION CRT-P N/A 04/29/2020   Procedure: BIV PACEMAKER INSERTION CRT-P;  Surgeon: Constance Haw, MD;  Location: [place] CV LAB;  Service: Cardiovascular;  Laterality: N/A;  . CORONARY ARTERY BYPASS GRAFT N/A 04/25/2020   Procedure: CORONARY ARTERY BYPASS GRAFTING (CABG) TIMES TWO USING LEFT INTERNAL MAMMARY ARTERY AND RIGHT GREATER SAPHENOUS VEIN HARVESTED ENDOSCOPICALLY;  Surgeon: Melrose Nakayama, MD;  Location: [place];  Service: Open Heart Surgery;  Laterality: N/A;  . FRACTURE SURGERY    . HIP SURGERY    . LEFT HEART CATH AND CORONARY ANGIOGRAPHY N/A 04/22/2020   Procedure: LEFT HEART CATH AND CORONARY ANGIOGRAPHY;  Surgeon: Larey Dresser, MD;  Location: [place] [facility] LAB;  Service: Cardiovascular;  Laterality: N/A;  . TEE WITHOUT CARDIOVERSION  04/25/2020   Procedure: TRANSESOPHAGEAL ECHOCARDIOGRAM (TEE);  Surgeon: Melrose Nakayama, MD;  Location: [place];  Service: Open Heart Surgery;;  .  TEMPORARY PACEMAKER N/A 04/21/2020   Procedure: TEMPORARY PACEMAKER;  Surgeon: Larey Dresser, MD;  Location: [place] [facility] LAB;  Service: Cardiovascular;  Laterality: N/A;  . TUBAL LIGATION      Allergies  Allergen Reactions  . Lexiscan [Regadenoson] Other (See Comments)    Perioral tingling and throat tightness    Outpatient Encounter Medications as of 05/13/2020  Medication Sig  . acetaminophen (TYLENOL) 325 MG tablet Take 650 mg by mouth  every 4 (four) hours as needed.  Marland Kitchen atorvastatin (LIPITOR) 80 MG tablet TAKE 1 TABLET AT BEDTIME  . baclofen (LIORESAL) 20 MG tablet Take 20 mg by mouth 3 (three) times daily. For Muscle Spasms And at Bedtime as needed for muscle spasms  . Cholecalciferol 50 MCG (2000 UT) TABS Take 1 tablet by mouth daily in the afternoon.  . Colchicine 0.6 MG CAPS Take 1 capsule by mouth daily in the afternoon. For GOUT  . Lacosamide 100 MG TABS Take 1 tablet by mouth 2 (two) times daily.  Marland Kitchen levETIRAcetam (KEPPRA) 750 MG tablet Take 2 tablets (1,500 mg total) by mouth 2 (two) times daily.  . polyethylene glycol (MIRALAX / GLYCOLAX) 17 g packet Take 17 g by mouth daily as needed.  . sacubitril-valsartan (ENTRESTO) 24-26 MG Take 1 tablet by mouth 2 (two) times daily.  Marland Kitchen spironolactone (ALDACTONE) 25 MG tablet TAKE 1/2 TABLET EVERY DAY  . traMADol (ULTRAM) 50 MG tablet Take 50 mg by mouth every 6 (six) hours as needed.  . warfarin (COUMADIN) 4 MG tablet Take 4 mg by mouth every evening.  . [DISCONTINUED] aspirin EC 81 MG tablet Take 81 mg by mouth daily. Swallow whole.  . [DISCONTINUED] furosemide (LASIX) 20 MG tablet Take 20 mg by mouth daily.  . [DISCONTINUED] metoprolol succinate (TOPROL-XL) 25 MG 24 hr tablet Take 12.5 mg by mouth at bedtime.  . digoxin (LANOXIN) 0.125 MG tablet Take by mouth daily.  . [DISCONTINUED] acetaminophen (TYLENOL) 325 MG tablet Take 1-2 tablets (325-650 mg total) by mouth every 4 (four) hours as needed for mild pain.  . [DISCONTINUED] carvedilol (COREG) 6.25 MG tablet Take 6.25 mg by mouth 2 (two) times daily with a meal.  . [DISCONTINUED] warfarin (COUMADIN) 5 MG tablet Take 5 mg by mouth daily.   No facility-administered encounter medications on file as of 05/13/2020.    Review of Systems unable to obtain due to aphasia.    Immunization History  Administered Date(s) Administered  . Fluad Quad(high Dose 65+) 10/10/2018  . Influenza,inj,Quad PF,6+ Mos 12/12/2012, 10/23/2013,  09/30/2014, 04/05/2016  . PFIZER Comirnaty(Gray Top)Covid-19 Tri-Sucrose Vaccine 05/04/2020  . PFIZER(Purple Top)SARS-COV-2 Vaccination 03/22/2019, 04/15/2019  . Pneumococcal Conjugate-13 09/30/2014  . Pneumococcal Polysaccharide-23 04/05/2016  . Tdap 12/12/2012   Pertinent  Health Maintenance Due  Topic Date Due  . COLONOSCOPY (Pts 45-70yrs Insurance coverage will need to be confirmed)  10/07/2020 (Originally 10/21/2017)  . INFLUENZA VACCINE  08/22/2020  . MAMMOGRAM  08/03/2021  . DEXA SCAN  Completed  . PNA vac Low Risk Adult  Completed   Fall Risk  04/12/2020 03/02/2020 12/14/2019 10/08/2019 04/09/2019  Falls in the past year? 0 0 0 - 0  Number falls in past yr: 0 0 0 0 0  Injury with Fall? 0 0 0 0 0  Risk for fall due to : - - - - Impaired balance/gait;Mental status change  Follow up Falls evaluation completed Falls evaluation completed Falls evaluation completed - -     Vitals:   05/13/20 1146  BP: Marland Kitchen)  126/57  Pulse: 73  Resp: 20  Temp: (!) 97.2 F (36.2 C)  SpO2: 93%  Weight: 119 lb 9.6 oz (54.3 kg)  Height: 5\' 4"  (1.626 m)   Body mass index is 20.53 kg/m.  Physical Exam  GENERAL APPEARANCE: Well nourished. In no acute distress. Normal body habitus SKIN:  Chest midline vertical surgical incision with 2 holes with black sutures, dry and no erythema MOUTH and THROAT: Lips are without lesions. Oral mucosa is moist and without lesions.  RESPIRATORY: Breathing is even & unlabored, BS CTAB CARDIAC: RRR, no murmur,no extra heart sounds, no edema GI: Abdomen soft, normal BS, no masses, no tenderness NEUROLOGICAL: There is no tremor. Aphasic.Right hemiplegia. Right hand with glove splint. PSYCHIATRIC:  Affect and behavior are appropriate  Labs reviewed: Recent Labs    04/25/20 2044 04/26/20 0014 04/26/20 0117 04/26/20 0811 04/26/20 1555 04/27/20 0359 05/02/20 0040 05/03/20 0111 05/04/20 0232  NA 136   < > 136   < > 137   < > 138 137 136  K 3.7   < > 4.1   < > 4.3    < > 4.0 3.5 3.9  CL 107  --  105  --  106   < > 104 101 105  CO2 22  --  22  --  25   < > 25 24 24   GLUCOSE 110*  --  170*  --  139*   < > 111* 115* 100*  BUN 12  --  13  --  13   < > 14 16 11   CREATININE 0.78  --  0.90  --  1.06*   < > 1.00 0.94 0.83  CALCIUM 8.5*  --  8.7*  --  8.2*   < > 8.6* 8.6* 8.5*  MG 2.7*  --  2.9*  --  2.2  --   --   --   --    < > = values in this interval not displayed.   Recent Labs    10/08/19 1031 12/14/19 1020 04/21/20 0825  AST 24 24 28   ALT 13 9 15   ALKPHOS 161* 156* 95  BILITOT <0.2 0.3 0.4  PROT 7.3 7.5 6.5  ALBUMIN 4.2 4.3 3.3*   Recent Labs    03/02/20 1221 04/21/20 0825 04/21/20 0832 05/03/20 0111 05/04/20 0232 05/05/20 0031  WBC 6.8 9.2   < > 14.7* 12.6* 14.0*  NEUTROABS 4.0 1.7  --   --   --   --   HGB 12.8 11.1*   < > 9.6* 9.4* 9.4*  HCT 38.5 37.0   < > 29.0* 28.2* 28.5*  MCV 85 95.9   < > 87.3 87.6 88.5  PLT 303 322   < > 356 429* 533*   < > = values in this interval not displayed.   Lab Results  Component Value Date   TSH 1.846 10/23/2013   Lab Results  Component Value Date   HGBA1C 6.1 (H) 04/22/2020   Lab Results  Component Value Date   CHOL 124 04/22/2020   HDL 41 04/22/2020   LDLCALC 64 04/22/2020   TRIG 94 04/22/2020   CHOLHDL 3.0 04/22/2020    Significant Diagnostic Results in last 30 days:  DG Chest 2 View  Result Date: 04/30/2020 CLINICAL DATA:  Pacemaker placement EXAM: CHEST - 2 VIEW COMPARISON:  April 29, 2020 FINDINGS: Pacemaker present on the left. Pacemaker leads are attached to the right atrium, right ventricle, and coronary sinus. Temporary pacemaker wires  are also attached to the right heart. No evident pneumothorax. Heart is mildly enlarged with pulmonary vascularity normal. Patient is status post coronary artery bypass grafting. There is a left pleural effusion consolidation in portions of the lingula and left lower lobe. Minimal right pleural effusion. Mild right base atelectasis. There is aortic  atherosclerosis. No bone lesions. IMPRESSION: Pacemaker leads as described. No pneumothorax. Mild cardiomegaly. Left pleural effusion with consolidation in portions of the left lower lobe and lingula. Suspect combination of atelectasis and pneumonia. Minimal right pleural effusion with right base atelectasis. Right lung otherwise clear. Aortic Atherosclerosis (ICD10-I70.0). Electronically Signed   By: Lowella Grip III M.D.   On: 04/30/2020 08:46   CARDIAC CATHETERIZATION  Result Date: 04/22/2020  Dist LM lesion is 70% stenosed.  Ost Cx lesion is 70% stenosed.  Prox Cx lesion is 99% stenosed.  1. LVEDP 7. 2. 60-70% distal left main stenosis. 3. Heavily calcified proximal LCx.  60-70% stenosis at the ostium with discrete 99% stenosis in the proximal LCx.  I suspect that her CHB is related to chronic ischemia from LM-LCx disease.  Difficult situation.  She has surgical disease but seems to be a poor candidate for CABG with weakness and aphasia from prior stroke though per her husband, she does all ADLs and walks with cane at home.  PCI would be possible but high risk/difficult heavy calcification and tortuousity.  I discussed the films with Dr. [place].  Will ask surgery to see for an opinion.  If they turn her down for CABG, would plan high risk PCI from LM into the proximal LCx on Monday or Tuesday.  Will need to keep TTVP in place.   CARDIAC CATHETERIZATION  Result Date: 04/21/2020 Successful temporary transvenous pacemaker placement from right femoral vein.  Left at 80 bpm, 8 mA.  Threshold 3.5 mA.   EP PPM/ICD IMPLANT  Result Date: 04/29/2020 SURGEON: Allegra Lai, MD PREPROCEDURE DIAGNOSES: 1. Ischemic cardiomyopathy. 2. New York Heart Association class II, heart failure chronically. 3. Left bundle-branch block. POSTPROCEDURE DIAGNOSES: 1. Ischemic cardiomyopathy. 2. New York Heart Association class II heart failure chronically. 3. Left bundle-branch block. PROCEDURES: 1. Left upper extremity  venography 2. Biventricular pacemaker implantation. INTRODUCTION:  Sari Shales [place] is a 70 y.o. female with a ischemic CM (EF 30/35%), NYHA Class II CHF, and LBBB QRS morophology.  She presented to hospital with complete heart block.  She had a left heart catheterization that showed significant coronary artery disease and now status post CABG.  She remains in complete heart block.  She thus presents for CRT-P implantation. DESCRIPTION OF PROCEDURE: Informed written consent was obtained and the patient was brought to the electrophysiology lab in the fasting state. The patient was adequately sedated with intravenous Versed, and fentanyl as outlined in the nursing report. The patient's left chest was prepped and draped in the usual sterile fashion by the EP lab staff. The skin overlying the left deltopectoral region was infiltrated with lidocaine for local analgesia. A 5-cm incision was made over the left deltopectoral region. A left subcutaneous pacemaker pocket was fashioned using a combination of sharp and blunt dissection. Electrocautery was used to assure hemostasis. RA/RV Lead Placement: The left axillary vein was cannulated with fluoroscopic visualization. No contrast was required for this endeavor. Through the left axillary vein, and Abbott tendril MRI LPA 1200 M (serial #[id number]) right atrial lead and an Abbott tendril MRI LPA 1200 M (serial number [id number]) right ventricular pacing lead were advanced with fluoroscopic visualization into  the right atrial appendage and right ventricular apex positions respectively. Initial atrial lead P-waves measured 1.7 mV with an impedance of 404 ohms and a threshold of 2.1 volts at 0.5 milliseconds. The right ventricular lead R-wave measured 10 mV with impedance of 716 ohms and a threshold of 0.8 volts at 0.5 milliseconds. LV Lead Placement: An Abbott 115 guide was advanced through the left axillary vein into the low lateral right atrium. A Bard curved Damato  catheter was introduced through the guide and used to cannulate the coronary sinus.  A selective coronary sinus venogram was performed by hand injection of nonionic contrast. This demonstrated a large CS body with very small/ atretic distal branches. There was a moderate sized lateral coronary sinus branch was noted along the mid portion of the CS body. No other posterior branches were identified. A Whisper CSJ wire was introduced through the guide and advanced into the distal posterolateral branch. A Saint Jude [id number] (serial number [facility] [id number]) lead was advanced through the MB-2 into the lateral branch. This was approximately one-thirds from the base to the apex in a very lateral position. In this location, the left ventricular lead R-waves measured 4.6 mV with impedance of 702 ohms and a threshold of 1.1 volt at 0.5 milliseconds in the bipolar LV2-LV3 configuration with no diaphragmatic stimulation observed when pacing at 10 volts output. The guide was therefore removed. All three leads were secured to the pectoralis fascia using #2 silk suture over the suture sleeves. The pocket then irrigated with copious gentamicin solution. The leads were then connected to [place] (serial Number [id number]) device. The pacemaker was placed into the pocket. The pocket was then closed in 3 layers with 2.0 Vicryl suture for the subcutaneous and 3.0 Vicryl suture subcuticular layers. Steri-Strips and a sterile dressing were then applied. DFT testing was not performed today. The procedure was therefore considered completed. EBL<22ml. There were no early apparhent complications. CONCLUSIONS: 1. Ischemic cardiomyopathy with Left bundle-branch block and chronic New York Heart Association class II heart failure. 2. Successful biventricular pacemaker implantation. 3. No early apparent complications.   DG Chest Port 1 View  Result Date: 04/29/2020 CLINICAL DATA:  Shortness of breath. EXAM: PORTABLE CHEST 1 VIEW  COMPARISON:  April 28, 2020. FINDINGS: Stable cardiomegaly. Status post coronary bypass graft. No pneumothorax is noted. Mild central pulmonary vascular congestion is noted. Bibasilar opacities are noted concerning for edema or atelectasis with associated pleural effusions, left greater than right. Bony thorax is unremarkable. IMPRESSION: Stable cardiomegaly with mild central pulmonary vascular congestion. Bibasilar opacities are noted concerning for edema or atelectasis with associated pleural effusions, left greater than right. Aortic Atherosclerosis (ICD10-I70.0). Electronically Signed   By: Marijo Conception M.D.   On: 04/29/2020 08:31   DG Chest Port 1 View  Result Date: 04/28/2020 CLINICAL DATA:  Sore chest.  Status post open heart surgery. EXAM: PORTABLE CHEST 1 VIEW COMPARISON:  04/27/2020. FINDINGS: Right IJ sheath in stable position. Prior CABG. Stable cardiomegaly. Persistent bibasilar pulmonary infiltrates/edema and bilateral pleural effusions. No pneumothorax. IMPRESSION: 1.  Right IJ sheath in stable position. 2. Prior CABG. Stable cardiomegaly. Persistent bibasilar infiltrates/edema and bilateral pleural effusions again noted. Electronically Signed   By: Marcello Moores  Register   On: 04/28/2020 06:24   DG Chest Port 1 View  Result Date: 04/27/2020 CLINICAL DATA:  Sore chest. EXAM: PORTABLE CHEST 1 VIEW COMPARISON:  04/26/2020. FINDINGS: Interim extubation and removal of NG tube. Interim removal of mediastinal drainage catheter left chest tube.  Right IJ sheath in stable position. Prior CABG. Cardiomegaly. Bibasilar atelectasis. Diffuse bilateral interstitial prominence and bilateral pleural effusions again noted suggesting CHF. Similar findings noted on prior exam. IMPRESSION: 1. Interim extubation and removal of NG tube. Interim removal of mediastinal drainage catheter and left chest tube. No pneumothorax. 2. Prior CABG. Cardiomegaly with bilateral interstitial prominence and bilateral pleural effusions  again noted. Findings consistent CHF. Persistent bibasilar atelectasis. Electronically Signed   By: Marcello Moores  Register   On: 04/27/2020 05:38   DG Chest Port 1 View  Result Date: 04/26/2020 CLINICAL DATA:  Open-heart surgery. EXAM: PORTABLE CHEST 1 VIEW COMPARISON:  04/25/2020. FINDINGS: Endotracheal tube 14 mm above the carina. NG tube, right IJ sheath, mediastinal drainage catheter, left chest tube in stable position. Prior CABG. Heart size stable. Low lung volumes with bibasilar atelectasis. Bilateral mild interstitial prominence. A component of CHF cannot be excluded. Small bilateral pleural effusions noted. No pneumothorax. Peripheral vascular calcification. Mild thoracic spine scoliosis. IMPRESSION: 1.  Endotracheal tube 14 mm above the carina. 2. NG tube, right IJ sheath, mediastinal drainage catheter, left chest tube in stable position. No pneumothorax. 3. Prior CABG. Heart size stable. Mild bilateral interstitial prominence noted on today's exam suggesting interstitial edema. Small bilateral pleural effusions noted. Findings suggest mild CHF. Electronically Signed   By: Marcello Moores  Register   On: 04/26/2020 06:00   DG Chest Port 1 View  Result Date: 04/25/2020 CLINICAL DATA:  Post CABG EXAM: PORTABLE CHEST 1 VIEW COMPARISON:  04/21/2020 FINDINGS: Changes of CABG. Left chest tube in place. No pneumothorax. Endotracheal tube tip is less than 1 cm above the carina. NG tube is in the stomach. Right central line tip in the SVC. No pneumothorax. Heart is normal size. Mild vascular congestion and bibasilar atelectasis. IMPRESSION: Postoperative changes. Endotracheal tube just above the carina by 8 mm. Vascular congestion, bibasilar atelectasis. Electronically Signed   By: Rolm Baptise M.D.   On: 04/25/2020 18:20   DG Chest Portable 1 View  Result Date: 04/21/2020 CLINICAL DATA:  70 year old female with weakness, syncope, wide complex bradycardia. EXAM: PORTABLE CHEST 1 VIEW COMPARISON:  Portable chest  09/18/2018 and earlier. FINDINGS: Portable AP semi upright view at 0900 hours. Pacer/resuscitation pads project over the left chest. Larger lung volumes. Mediastinal contours are stable and within normal limits. Visualized tracheal air column is within normal limits. Allowing for portable technique the lungs are clear. No pneumothorax identified, linear external artifact projecting over the outer right lung. No acute osseous abnormality identified. IMPRESSION: No acute cardiopulmonary abnormality. Electronically Signed   By: Genevie Ann M.D.   On: 04/21/2020 09:08   ECHOCARDIOGRAM COMPLETE  Result Date: 04/29/2020    ECHOCARDIOGRAM REPORT   Patient Name:   Aleia MOORE [place] Date of Exam: 04/29/2020 Medical Rec #:  [id number]            Height:       67.0 in Accession #:    [id number]           Weight:       132.5 lb Date of Birth:  Aug 16, 1950           BSA:          1.698 m Patient Age:    70 years             BP:           109/61 mmHg Patient Gender: F  HR:           52 bpm. Exam Location:  Inpatient Procedure: 2D Echo and Intracardiac Opacification Agent STAT ECHO Indications:    CHF; Needs echo before Pacemaker placement.  History:        Patient has prior history of Echocardiogram examinations, most                 recent 04/25/2020. CHF, Prior CABG, Stroke; Risk                 Factors:Hypertension and Dyslipidemia. Sickle Cell Anemia.  Sonographer:    Darlina Sicilian RDCS Referring Phys: [place]  1. Left ventricular ejection fraction, by estimation, is 30 to 35%. The left ventricle has moderately decreased function. The left ventricle demonstrates regional wall motion abnormalities (see scoring diagram/findings for description). Inferior/lateral  akinesis. Left ventricular diastolic parameters are indeterminate.  2. Right ventricular systolic function is severely reduced. The right ventricular size is normal. There is normal pulmonary artery systolic pressure.  3. The  mitral valve is normal in structure. Trivial mitral valve regurgitation.  4. The aortic valve is tricuspid. Aortic valve regurgitation is moderate. Mild aortic valve sclerosis is present, with no evidence of aortic valve stenosis. FINDINGS  Left Ventricle: Left ventricular ejection fraction, by estimation, is 30 to 35%. The left ventricle has moderately decreased function. The left ventricle demonstrates regional wall motion abnormalities. Definity contrast agent was given IV to delineate the left ventricular endocardial borders. The left ventricular internal cavity size was normal in size. There is no left ventricular hypertrophy. Left ventricular diastolic parameters are indeterminate.  LV Wall Scoring: The entire lateral wall and entire inferior wall are akinetic. The apex is hypokinetic. The entire anterior wall and entire septum are normal. Right Ventricle: The right ventricular size is normal. Right vetricular wall thickness was not well visualized. Right ventricular systolic function is severely reduced. There is normal pulmonary artery systolic pressure. The tricuspid regurgitant velocity is 2.43 m/s, and with an assumed right atrial pressure of 8 mmHg, the estimated right ventricular systolic pressure is 27.7 mmHg. Left Atrium: Left atrial size was normal in size. Right Atrium: Right atrial size was normal in size. Pericardium: There is no evidence of pericardial effusion. Mitral Valve: The mitral valve is normal in structure. Trivial mitral valve regurgitation. Tricuspid Valve: The tricuspid valve is normal in structure. Tricuspid valve regurgitation is trivial. Aortic Valve: The aortic valve is tricuspid. Aortic valve regurgitation is moderate. Aortic regurgitation PHT measures 630 msec. Mild aortic valve sclerosis is present, with no evidence of aortic valve stenosis. Pulmonic Valve: The pulmonic valve was not well visualized. Pulmonic valve regurgitation is not visualized. Aorta: The aortic root and  ascending aorta are structurally normal, with no evidence of dilitation. IAS/Shunts: The interatrial septum was not well visualized.  LEFT VENTRICLE PLAX 2D LVIDd:         3.30 cm      Diastology LVIDs:         3.10 cm      LV e' medial:    7.18 cm/s LV PW:         1.00 cm      LV E/e' medial:  14.3 LV IVS:        1.00 cm      LV e' lateral:   6.69 cm/s LVOT diam:     1.90 cm      LV E/e' lateral: 15.4 LV SV:  47 LV SV Index:   28 LVOT Area:     2.84 cm  LV Volumes (MOD) LV vol d, MOD A2C: 117.0 ml LV vol d, MOD A4C: 163.0 ml LV vol s, MOD A2C: 82.6 ml LV vol s, MOD A4C: 110.0 ml LV SV MOD A2C:     34.4 ml LV SV MOD A4C:     163.0 ml LV SV MOD BP:      48.8 ml RIGHT VENTRICLE RV S prime:     5.66 cm/s LEFT ATRIUM             Index       RIGHT ATRIUM           Index LA diam:        1.50 cm 0.88 cm/m  RA Area:     11.20 cm LA Vol (A2C):   30.1 ml 17.73 ml/m RA Volume:   25.60 ml  15.08 ml/m LA Vol (A4C):   24.7 ml 14.55 ml/m LA Biplane Vol: 28.4 ml 16.73 ml/m  AORTIC VALVE LVOT Vmax:   105.00 cm/s LVOT Vmean:  61.400 cm/s LVOT VTI:    0.165 m AI PHT:      630 msec  AORTA Ao Root diam: 3.00 cm Ao Asc diam:  2.70 cm MITRAL VALVE                TRICUSPID VALVE MV Area (PHT): 6.48 cm     TR Peak grad:   23.6 mmHg MV Decel Time: 117 msec     TR Vmax:        243.00 cm/s MV E velocity: 103.00 cm/s MV A velocity: 82.50 cm/s   SHUNTS MV E/A ratio:  1.25         Systemic VTI:  0.16 m                             Systemic Diam: 1.90 cm Oswaldo Milian MD Electronically signed by Oswaldo Milian MD Signature Date/Time: 04/29/2020/9:55:55 AM    Final    ECHOCARDIOGRAM COMPLETE  Result Date: 04/21/2020    ECHOCARDIOGRAM REPORT   Patient Name:   Elmarie MOORE [place] Date of Exam: 04/21/2020 Medical Rec #:  [id number]            Height:       67.0 in Accession #:    [id number]           Weight:       125.7 lb Date of Birth:  19-Mar-1950           BSA:          1.660 m Patient Age:    47 years             BP:            127/71 mmHg Patient Gender: F                    HR:           80 bpm. Exam Location:  Inpatient Procedure: 2D Echo, Cardiac Doppler and Color Doppler Indications:    I50.23 Acute on chronic systolic (congestive) heart failure  History:        Patient has prior history of Echocardiogram examinations, most                 recent 09/19/2018. CHF, Stroke; Risk Factors:Hypertension and  Dyslipidemia. Seizures. Sickle Cell anemia.  Sonographer:    Jonelle Sidle Dance Referring Phys: [place]  1. Left ventricular ejection fraction, by estimation, is 35 to 40%. The left ventricle has moderately decreased function. The left ventricle demonstrates regional wall motion abnormalities (see scoring diagram/findings for description). Left ventricular  diastolic parameters are consistent with Grade I diastolic dysfunction (impaired relaxation). Elevated left ventricular end-diastolic pressure. There is severe hypokinesis of the left ventricular, entire inferior wall and lateral wall.  2. Right ventricular systolic function is normal. The right ventricular size is normal. There is normal pulmonary artery systolic pressure.  3. Left atrial size was mildly dilated.  4. Right atrial size was mildly dilated.  5. The mitral valve is grossly normal. Trivial mitral valve regurgitation.  6. The aortic valve is tricuspid. There is mild calcification of the aortic valve. There is mild thickening of the aortic valve. Aortic valve regurgitation is moderate. Mild to moderate aortic valve sclerosis/calcification is present, without any evidence of aortic stenosis.  7. The inferior vena cava is normal in size with greater than 50% respiratory variability, suggesting right atrial pressure of 3 mmHg. Comparison(s): No significant change from prior study. FINDINGS  Left Ventricle: Left ventricular ejection fraction, by estimation, is 35 to 40%. The left ventricle has moderately decreased function. The left  ventricle demonstrates regional wall motion abnormalities. Severe hypokinesis of the left ventricular, entire  inferior wall and lateral wall. The left ventricular internal cavity size was normal in size. There is no left ventricular hypertrophy. Abnormal (paradoxical) septal motion, consistent with RV pacemaker. Left ventricular diastolic parameters are consistent with Grade I diastolic dysfunction (impaired relaxation). Elevated left ventricular end-diastolic pressure. Right Ventricle: The right ventricular size is normal. No increase in right ventricular wall thickness. Right ventricular systolic function is normal. There is normal pulmonary artery systolic pressure. The tricuspid regurgitant velocity is 2.13 m/s, and  with an assumed right atrial pressure of 3 mmHg, the estimated right ventricular systolic pressure is 123456 mmHg. Left Atrium: Left atrial size was mildly dilated. Right Atrium: Right atrial size was mildly dilated. Pericardium: There is no evidence of pericardial effusion. Mitral Valve: The mitral valve is grossly normal. There is mild thickening of the mitral valve leaflet(s). There is mild calcification of the mitral valve leaflet(s). Trivial mitral valve regurgitation. Tricuspid Valve: The tricuspid valve is normal in structure. Tricuspid valve regurgitation is trivial. Aortic Valve: The aortic valve is tricuspid. There is mild calcification of the aortic valve. There is mild thickening of the aortic valve. Aortic valve regurgitation is moderate. Aortic regurgitation PHT measures 351 msec. Mild to moderate aortic valve sclerosis/calcification is present, without any evidence of aortic stenosis. Pulmonic Valve: The pulmonic valve was not well visualized. Pulmonic valve regurgitation is not visualized. Aorta: The aortic root, ascending aorta and aortic arch are all structurally normal, with no evidence of dilitation or obstruction. Venous: The inferior vena cava is normal in size with greater than  50% respiratory variability, suggesting right atrial pressure of 3 mmHg. IAS/Shunts: The atrial septum is grossly normal. Additional Comments: A device lead is visualized.  LEFT VENTRICLE PLAX 2D LVIDd:         3.50 cm  Diastology LVIDs:         2.50 cm  LV e' medial:    4.03 cm/s LV PW:         1.20 cm  LV E/e' medial:  19.0 LV IVS:        1.00 cm  LV e' lateral:   3.50 cm/s LVOT diam:     1.80 cm  LV E/e' lateral: 21.9 LV SV:         32 LV SV Index:   19 LVOT Area:     2.54 cm                          3D Volume EF:                         3D EF:        37 % RIGHT VENTRICLE            IVC RV Basal diam:  2.80 cm    IVC diam: 2.00 cm RV S prime:     9.99 cm/s TAPSE (M-mode): 1.9 cm LEFT ATRIUM             Index       RIGHT ATRIUM           Index LA diam:        2.70 cm 1.63 cm/m  RA Area:     13.20 cm LA Vol (A2C):   46.6 ml 28.08 ml/m RA Volume:   29.90 ml  18.01 ml/m LA Vol (A4C):   33.7 ml 20.30 ml/m LA Biplane Vol: 39.7 ml 23.92 ml/m  AORTIC VALVE LVOT Vmax:   73.80 cm/s LVOT Vmean:  45.300 cm/s LVOT VTI:    0.127 m AI PHT:      351 msec  AORTA Ao Root diam: 3.10 cm Ao Asc diam:  3.00 cm MITRAL VALVE                TRICUSPID VALVE MV Area (PHT): 3.77 cm     TR Peak grad:   18.1 mmHg MV Decel Time: 201 msec     TR Vmax:        213.00 cm/s MV E velocity: 76.70 cm/s MV A velocity: 108.00 cm/s  SHUNTS MV E/A ratio:  0.71         Systemic VTI:  0.13 m                             Systemic Diam: 1.80 cm Jodelle RedBridgette Christopher MD Electronically signed by Jodelle RedBridgette Christopher MD Signature Date/Time: 04/21/2020/9:06:37 PM    Final    CUP PACEART INCLINIC DEVICE CHECK  Result Date: 05/12/2020 Wound check appointment. Steri-strips removed. Wound without redness or edema. Incision edges approximated, wound well healed. Normal device function. RA/RV threshold, RA/RV/LV sensing, and impedances consistent with implant measurements. LV threshold increased from 1.1V@0 .5ms at implant to 3.5V@0 .5ms. Industry consulted  after threshold remained 2.5V@1 .0ms.  Vectors tested yielding phrenic nerve stimulation from 1 to 2. Best threshold of 2.0@0 .5 achieved with 2-Can programming. No phrenic nerve stimulation noted. LV cap confirm suggested and turned on. At implant device programmed at 3.5V/5.0V for RA/RV safety margins for newly implanted leads. Histogram distribution appropriate for patient and level of activity. No mode switches or high ventricular rates noted. Patient/spouse/SNF educated about wound care, arm mobility, lifting restrictions. Confirmed receipt of remote transmissions. Next 08/01/20. ROV with Dr. Elberta Fortisamnitz 08/09/20. Error in saving all of attachment.Portia E. Suzie PortelaPayne, BSN, RN  ECHO INTRAOPERATIVE TEE  Result Date: 04/25/2020  *INTRAOPERATIVE TRANSESOPHAGEAL REPORT *  Patient Name:   Dailey MOORE [place] Date of Exam: 04/25/2020 Medical Rec #:  [id number]  Height:       67.0 in Accession #:    [id number]           Weight:       119.7 lb Date of Birth:  07/02/1950           BSA:          1.63 m Patient Age:    46 years             BP:           113/55 mmHg Patient Gender: F                    HR:           76 bpm. Exam Location:  Anesthesiology Transesophogeal exam was perform intraoperatively during surgical procedure. Patient was closely monitored under general anesthesia during the entirety of examination. Indications:     Coronary Artery Disease Sonographer:     Bernadene Person RDCS Performing Phys: [place] Diagnosing Phys: Annye Asa MD PROCEDURE: Intraoperative Transesophogeal Teeth intact after easy TEE probe insertion. Complications: No known complications during this procedure. POST-OP IMPRESSIONS Limited Post CPB exam: The patient separated from CPB easily with low MIlrinone support. - Left Ventricle: The overall left ventricular is essentially unchanged from pre-bypass images. There is some interval improvement in the basal region, and also in that infero-septal wall. Overall EF  45-50%. - Right Ventricle: The right ventricular function appears unchanged from pre-bypass images. - Aortic Valve: The aortic valve function appears unchanged from pre-bypass images. Mild, anterior directed AI is present. - Mitral Valve: The mitral valve function appears unchanged from pre-bypass images. - Tricuspid Valve: The tricuspid valve function appears unchanged from pre-bypass images. PRE-OP FINDINGS  Left Ventricle: The left ventricle has mild-moderately reduced systolic function, with an ejection fraction of 40-45%, measured 40%. The cavity size was mildly dilated. Left ventricular has diffuse hypokinesis, with Severe hypokinesis of the Basal inferoseptal wall and entire basal region. There is no left ventricular hypertrophy. Left ventricular diastolic function was not evaluated. Right Ventricle: The right ventricle has normal systolic function. The cavity was normal. There is no increase in right ventricular wall thickness. Left Atrium: Left atrial size was normal in size. No left atrial/left atrial appendage thrombus was detected. Left atrial appendage velocity is normal at greater than 40 cm/s. Right Atrium: Right atrial size was normal in size. Catheter present traversing through the right atrium. Interatrial Septum: No atrial level shunt detected by color flow Doppler. There is left bowing of the interatrial septum, suggestive of elevated right atrial pressure. There is no evidence of a patent foramen ovale by color doppler interrogation. Pericardium: There is no evidence of pericardial effusion. Mitral Valve: The mitral valve is normal in structure. Mitral valve regurgitation is trivial by color flow Doppler. There is no evidence of mitral valve vegetation. There is no evidence of mitral stenosis, with peak gradient 3 mmHg, mean gradient 1 mmHg. Tricuspid Valve: The tricuspid valve was normal in structure. Tricuspid valve regurgitation is trivial by color flow Doppler. No evidence of tricuspid  stenosis is present. There is no evidence of tricuspid valve vegetation. Aortic Valve: The aortic valve is tricuspid. Aortic valve regurgitation is mild by color flow Doppler. The jet is anteriorly-directed toward the anterior leaflet of the mitral valve. There is no stenosis of the aortic valve, with peak gradient 8 mmHg, mean gradient 4 mmHg. There is no evidence of aortic valve vegetation. Pulmonic Valve: The pulmonic  valve was normal in structure, with normal leaflet excursion. No evidence of pulmonic stenosis. Pulmonic valve regurgitation is trivial, around the PA catheter, by color flow Doppler. Aorta: There is evidence of calcification at the Sinotubular ridge and in the proximal ascending aorta. There is scattered moderate plaque in the aortic arch and descending aortic.Marland Kitchen Pulmonary Artery: Theone Murdoch catheter present on the right. The pulmonary artery is of normal size. Venous: The inferior vena cava is normal in size with less than 50% respiratory variability, suggesting right atrial pressure of 8 mmHg. Shunts: There is no evidence of an atrial septal defect. +-------------+--------++ AORTIC VALVE          +-------------+--------++ AV Mean Grad:4.0 mmHg +-------------+--------++ +-------------+--------++ MITRAL VALVE          +-------------+--------++ MV Mean grad:1.0 mmHg +-------------+--------++  Jairo Ben MD Electronically signed by Jairo Ben MD Signature Date/Time: 04/25/2020/5:27:32 PM    Final    VAS US DOPPLER PRE CABG  Result Date: 04/25/2020 PREOPERATIVE VASCULAR EVALUATION  Indications:      Pre-CABG. 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 Other Factors:    Sickle Cell Disease, CHF, seizure disorder. Limitations:      Right arm is contracted Comparison Study: No prior studies Performing Technologist: Sherren Kerns RVS  Examination Guidelines: A complete  evaluation includes B-mode imaging, spectral Doppler, color Doppler, and power Doppler as needed of all accessible portions of each vessel. Bilateral testing is considered an integral part of a complete examination. Limited examinations for reoccurring indications may be performed as noted.  Right Carotid Findings: +----------+--------+--------+--------+------------+--------+           PSV cm/sEDV cm/sStenosisDescribe    Comments +----------+--------+--------+--------+------------+--------+ CCA Prox  85      14                                   +----------+--------+--------+--------+------------+--------+ CCA Distal75      20              heterogenous         +----------+--------+--------+--------+------------+--------+ ICA Prox  96      17      1-39%   heterogenous         +----------+--------+--------+--------+------------+--------+ ICA Distal91      28                                   +----------+--------+--------+--------+------------+--------+ ECA       80      11                                   +----------+--------+--------+--------+------------+--------+ Portions of this table do not appear on this page. +----------+--------+-------+--------+------------+           PSV cm/sEDV cmsDescribeArm Pressure +----------+--------+-------+--------+------------+ Subclavian95                                  +----------+--------+-------+--------+------------+ +---------+--------+--+--------+--+ VertebralPSV cm/s75EDV cm/s12 +---------+--------+--+--------+--+ Left Carotid Findings: +----------+--------+--------+--------+----------------------+---------+           PSV cm/sEDV cm/sStenosisDescribe  Comments  +----------+--------+--------+--------+----------------------+---------+ CCA Prox  79      12              heterogenous                    +----------+--------+--------+--------+----------------------+---------+ CCA Distal80       9               irregular and calcific          +----------+--------+--------+--------+----------------------+---------+ ICA Prox  124     21      1-39%   heterogenous          Shadowing +----------+--------+--------+--------+----------------------+---------+ ICA Distal85      22                                              +----------+--------+--------+--------+----------------------+---------+ ECA       87      15                                              +----------+--------+--------+--------+----------------------+---------+ +----------+--------+--------+--------+------------+ SubclavianPSV cm/sEDV cm/sDescribeArm Pressure +----------+--------+--------+--------+------------+           90      1                            +----------+--------+--------+--------+------------+ +---------+--------+--+--------+--+ VertebralPSV cm/s90EDV cm/s13 +---------+--------+--+--------+--+  ABI Findings: +--------+------------------+-----+------------------+--------------+ Right   Rt Pressure (mmHg)IndexWaveform          Comment        +--------+------------------+-----+------------------+--------------+ [id number]                                      contracted arm +--------+------------------+-----+------------------+--------------+ PTA     254               2.07 monophasic                       +--------+------------------+-----+------------------+--------------+ DP                             unable to insonate               +--------+------------------+-----+------------------+--------------+ +--------+------------------+-----+-----------+-------+ Left    Lt Pressure (mmHg)IndexWaveform   Comment +--------+------------------+-----+-----------+-------+ [id number]                    multiphasic        +--------+------------------+-----+-----------+-------+ PTA     112               0.91 monophasic          +--------+------------------+-----+-----------+-------+ DP      64                0.52 monophasic         +--------+------------------+-----+-----------+-------+ +-------+---------------+----------------+ ABI/TBIToday's ABI/TBIPrevious ABI/TBI +-------+---------------+----------------+ Right  2.07                            +-------+---------------+----------------+ Left   0.91                            +-------+---------------+----------------+  Right Doppler Findings: +-----------+--------+-----+-------+--------------+ Site       PressureIndexDopplerComments       +-----------+--------+-----+-------+--------------+ Brachial   123                 contracted arm +-----------+--------+-----+-------+--------------+ Radial                         contracted arm +-----------+--------+-----+-------+--------------+ Ulnar                          contracted arm +-----------+--------+-----+-------+--------------+ Palmar Arch                    contracted arm +-----------+--------+-----+-------+--------------+  Left Doppler Findings: +--------+--------+-----+-----------+--------+ Site    PressureIndexDoppler    Comments +--------+--------+-----+-----------+--------+ [id number]          multiphasic         +--------+--------+-----+-----------+--------+ Radial               multiphasic         +--------+--------+-----+-----------+--------+ Ulnar                multiphasic         +--------+--------+-----+-----------+--------+  Summary: Right Carotid: Velocities in the right ICA are consistent with a 1-39% stenosis. Left Carotid: Velocities in the left ICA are consistent with a 1-39% stenosis. Vertebrals:  Bilateral vertebral arteries demonstrate antegrade flow. Subclavians: Normal flow hemodynamics were seen in bilateral subclavian              arteries. Right ABI: Resting right ankle-brachial index indicates noncompressible right lower extremity  arteries. Left ABI: Resting left ankle-brachial index indicates mild left lower extremity arterial disease. Right Upper Extremity: Patient has contracted arm, unable to perform testing Left Upper Extremity: Doppler waveforms decrease <50% w left radial compression. Doppler waveform obliterate with left ulnar compression.  Electronically signed by Ruta Hinds MD on 04/25/2020 at 3:02:47 PM.    Final    [place] EKG SITE RITE  Result Date: 04/30/2020 If Site Rite image not attached, placement could not be confirmed due to current cardiac rhythm.   Assessment/Plan  1. Pneumonia of both lower lobes due to infectious organism -   Will start on doxycycline 100 mg twice a day x7 days and Florastor 250 mg twice a day x10 days  2. History of CVA (cerebrovascular accident) -Stable, continue Coumadin and atorvastatin    Family/ staff Communication: Discussed plan of care with resident and charge nurse.  Labs/tests ordered: None  Goals of care:   Short-term care   Durenda Age, DNP, MSN, FNP-BC [facility] [phone] (Monday-Friday 8:00 a.m. - 5:00 p.m.) [phone] (after hours)

## 2020-05-13 NOTE — Progress Notes (Addendum)
CXR report received today: please see report and images below

## 2020-05-13 NOTE — Patient Instructions (Signed)
Increase Toprol XL to 25 mg Daily  Continue on Lasix 20 mg Daily  Labs done today, your results will be available in MyChart, we will contact you for abnormal readings.  Your physician recommends that you schedule a follow-up appointment in: 1 month  If you have any questions or concerns before your next appointment please send [place] a message through [place] or call our office at [phone].    TO LEAVE A MESSAGE FOR THE NURSE SELECT OPTION 2, PLEASE LEAVE A MESSAGE INCLUDING: . YOUR NAME . DATE OF BIRTH . CALL BACK NUMBER . REASON FOR CALL**this is important as we prioritize the call backs  [place] AS LONG AS YOU CALL BEFORE 4:00 PM  At the [facility], you and your health needs are our priority. As part of our continuing mission to provide you with exceptional heart care, we have created designated Provider Care Teams. These Care Teams include your primary Cardiologist (physician) and Advanced Practice Providers (APPs- Physician Assistants and Nurse Practitioners) who all work together to provide you with the care you need, when you need it.   You may see any of the following providers on your designated Care Team at your next follow up: Marland Kitchen Dr Glori Bickers . Dr Loralie Champagne . Dr Vickki Muff . Darrick Grinder, NP . Lyda Jester, [place] . Audry Riles, PharmD   Please be sure to bring in all your medications bottles to every appointment.

## 2020-05-15 NOTE — Progress Notes (Signed)
PCP: Just, [name], FNP HF Cardiology: Dr. Aundra Dubin  70 y.o. with history of CVA, chronic systolic CHF, CHB, and CAD returns for followup of CHF.   She had a stroke in 2009, thought to be cardioembolic.  She has had right hemiparesis and aphasia since that time.   [name] was hospitalized in August 2020 with a seizure after being out of her medications for 4 days.  She required intubation.  High-sensitivity troponin trended to 1905. An echocardiogram 08/2018 revealed EF of 35 to 40% with global hypokinesis and moderate LVH, elevated LVEDP.  She also had moderate AR. Nuclear stress test in August 2020 showed a large severe fixed inferior, anterior apical, and inferolateral perfusion defect suggestive of scar without ischemia.  Study was high risk due to EF estimated at 34% but mentions no significant reversible ischemia. She was deemed not a good candidate for cardiac catheterization during that hospitalization. She has followed with Dr. Oval [name] and was last seen in November 2021.   Patient was admitted in 3/22 following a syncopal episode and was found to be in complete heart block.  This improved initially after stopping nodal blockers.  Echo in 3/22 showed EF 35-40%.  LHC showed significant left main and LCx disease.  She was deemed to be a poor candidate for PCI.  In 4/22, she had CABG with LIMA-LAD and SVG-OM2.  Post-op, she developed CHB again.  St Jude CRT-P device was placed. She was discharged to a rehab facility.   She is still at the rehab facility.  No one comes with her today and communication is difficult due to aphasia.  I spoke with her husband over the phone.  She seems to be doing fairly well.  No apparent dyspnea or chest pain per husband.  She has been working with PT and can walk with a walker. No orthopnea.  BP stable.   ECG (personally reviewed): NSR, BiV pacing  Labs (4/22): K 3.9, creatinine 0.83, hgb 9.4  PMH: 1. H/o seizure disorder.  2. CVA: 2009 with aphasia and  right hemiparesis.  3. Complete heart block: St Jude CRT-P. 4. Chronic systolic CHF: Ischemic cardiomyopathy. St Jude CRT-P device.  - Echo (3/22): EF 35-40%, inferior and lateral HK, normal RV.  - Coronary angiography (4/22): 60-70% dLM, 60-70% ostial LCx, 99% proximal calcified LCx (LCx was large, dominant vessel).  5. CAD:  Coronary angiography (4/22) with 60-70% dLM, 60-70% ostial LCx, 99% proximal calcified LCx (LCx was large, dominant vessel). - CABG x 4 in 4/22: LIMA-LAD, SVG-OM2.  6. CHB: St Jude CRT-P placed in 4/22.   Social History   Socioeconomic History  . Marital status: Married    Spouse name: [name]  . Number of children: 2  . Years of education: 67  . Highest education level: Not on file  Occupational History    Employer: RETIRED  Tobacco Use  . Smoking status: Former Smoker    Types: Cigarettes    Quit date: 09/26/2007    Years since quitting: 12.6  . Smokeless tobacco: Never Used  Substance and Sexual Activity  . Alcohol use: No  . Drug use: No  . Sexual activity: Not Currently  Other Topics Concern  . Not on file  Social History Narrative   Lives with husband   Social Determinants of Health   Financial Resource Strain: Not on file  Food Insecurity: Not on file  Transportation Needs: Not on file  Physical Activity: Not on file  Stress: Not on file  Social Connections: Not on file  Intimate Partner Violence: Not on file   Family History  Problem Relation Age of Onset  . Diabetes Daughter   . Colon cancer Maternal Grandfather    ROS: All systems reviewed and negative except as per HPI.   Current Outpatient Medications  Medication Sig Dispense Refill  . aspirin 81 MG chewable tablet Chew 81 mg by mouth daily.    Marland Kitchen atorvastatin (LIPITOR) 80 MG tablet TAKE 1 TABLET AT BEDTIME 90 tablet 2  . baclofen (LIORESAL) 20 MG tablet Take 20 mg by mouth 3 (three) times daily. For Muscle Spasms And at Bedtime as needed for muscle spasms    . Cholecalciferol 50  MCG (2000 UT) TABS Take 1 tablet by mouth daily in the afternoon.    . furosemide (LASIX) 20 MG tablet Take 20 mg by mouth.    . Lacosamide 100 MG TABS Take 1 tablet by mouth 2 (two) times daily.    Marland Kitchen levETIRAcetam (KEPPRA) 750 MG tablet Take 2 tablets (1,500 mg total) by mouth 2 (two) times daily. 360 tablet 4  . polyethylene glycol (MIRALAX / GLYCOLAX) 17 g packet Take 17 g by mouth daily as needed.    . sacubitril-valsartan (ENTRESTO) 24-26 MG Take 1 tablet by mouth 2 (two) times daily. 180 tablet 3  . spironolactone (ALDACTONE) 25 MG tablet TAKE 1/2 TABLET EVERY DAY 45 tablet 1  . warfarin (COUMADIN) 4 MG tablet Take 4 mg by mouth every evening.    Marland Kitchen acetaminophen (TYLENOL) 325 MG tablet Take 650 mg by mouth every 4 (four) hours as needed.    . Colchicine 0.6 MG CAPS Take 1 capsule by mouth daily in the afternoon. For GOUT    . metoprolol succinate (TOPROL-XL) 25 MG 24 hr tablet Take 1 tablet (25 mg total) by mouth at bedtime. 60 tablet 2  . traMADol (ULTRAM) 50 MG tablet Take 50 mg by mouth every 6 (six) hours as needed.     No current facility-administered medications for this encounter.   BP 110/60   Pulse 95   SpO2 96%  General: NAD Neck: No JVD, no thyromegaly or thyroid nodule.  Lungs: Clear to auscultation bilaterally with normal respiratory effort. CV: Nondisplaced PMI.  Heart regular S1/S2, no S3/S4, no murmur.  No peripheral edema.  No carotid bruit.  Normal pedal pulses.  Abdomen: Soft, nontender, no hepatosplenomegaly, no distention.  Skin: Intact without lesions or rashes.  Neurologic: Alert. Aphasic.  Right hemiparesis.  Psych: Normal affect. Extremities: No clubbing or cyanosis.  HEENT: Normal.   Asssessment/Plan: 1. Complete heart block: Patient had baseline NSR with LAFB/RBBB, so there was underlying conduction system disease.  She was transiently in CHB pre-CABG and developed persistent CHB post-CABG.  She now has [employer] CRT-P device.   2. Chronic systolic CHF:  Ischemic cardiomyopathy.  Echo in 8/20 with EF 35-40% and wall motion abnormalities. LHC in 4/22 showed left main/severe codominant LCx disease.  Echo in 3/22 with EF 35-40%, inferior and lateral hypokinesis. Now s/p CABG.  Probably NYHA class II.  Not volume overloaded on exam.   - Continue Entresto 24/26 bid.   - Continue spironolactone 12.5 mg daily.  - Increase Toprol XL to 25 mg daily.  - Continue Lasix 20 mg daily. BMET today.  3. 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 Has been on warfarin. Has had long-standing aphasia and right hemiparesis.  - Continue warfarin.    4. H/o seizure disorder: Continue Keppra.  5. CAD:  LHC showed 60-70% distal left main stenosis, heavily calcified proximal LCx with 60-70% stenosis at the ostium and discrete 99% stenosis in the proximal LCx. I suspect that her CHB is related to chronic ischemia from LM-LCx disease worsened by nodal blockade. Had CABG x 2 4/22 w/ LIMA-LAD, SVG-OM2.  - Continue ASA 81 daily.  - Continue statin.   Followup in 1 month with NP/PA.   Loralie Champagne 05/15/2020

## 2020-05-17 ENCOUNTER — Encounter: Payer: Self-pay | Admitting: Adult Health

## 2020-05-17 ENCOUNTER — Non-Acute Institutional Stay (SKILLED_NURSING_FACILITY): Payer: Medicare HMO | Admitting: Adult Health

## 2020-05-17 DIAGNOSIS — Z8673 Personal history of transient ischemic attack (TIA), and cerebral infarction without residual deficits: Secondary | ICD-10-CM

## 2020-05-17 DIAGNOSIS — Z7901 Long term (current) use of anticoagulants: Secondary | ICD-10-CM

## 2020-05-17 NOTE — Progress Notes (Signed)
Location:  [facility] Room Number: [id number] Place of Service:  SNF (31) Provider:  Durenda Age, DNP, FNP-BC  Patient Care Team: Just, [name], FNP as PCP - General (Family Medicine) Skeet Latch, MD as PCP - Cardiology (Cardiology) Adrian Prows, MD as Attending Physician (Cardiology) Burnard Bunting, MD (Internal Medicine)  Extended Emergency Contact Information Primary Emergency Contact: Azure,[name] Address: [address]          [place], [place] [address] [name] of [place] Phone: [phone] Mobile Phone: [phone] Relation: Spouse Secondary Emergency Contact: Justice Deeds          [place], [place] [address] [name] of [place] Phone: [phone] Mobile Phone: [phone] Relation: Son  Code Status:  Full Code  Goals of care: Advanced Directive information Advanced Directives 05/13/2020  Does Patient Have a Medical Advance Directive? No  Type of Advance Directive -  Does patient want to make changes to medical advance directive? -  Copy of [place] in Chart? -  Would patient like information on creating a medical advance directive? No - Patient declined  Pre-existing out of facility DNR order (yellow form or pink MOST form) -     Chief Complaint  Patient presents with  . Acute Visit    Coumadin Therapy    HPI:  Pt is a 70 y.o. female seen today for Coumadin therapy. She has an INR of 1.3, subtherapeutic. She takes Coumadin 4 mg daily for  history of CVA with right hemiplegia. Her INR goal is 2-3.  There was no missed dose of Coumadin.  Past Medical History:  Diagnosis Date  . Adenomatous polyp of colon 09/2012   repeat colonoscopy in 5 years by Dr. Sharlett Iles  . CHF (congestive heart failure) (HCC)    EF 15-20% as of 05/28/11 (Dr Legrand [facility] summary)  . CVA (cerebral infarction) 12/2007  . Hemiparesis ([place])   . Hyperlipidemia   . Hypertension   . Seizures ([place])   .  Sickle cell anemia (HCC)   . Stroke ([place])   . Vitamin D deficiency 2010   Past Surgical History:  Procedure Laterality Date  . ABDOMINAL HYSTERECTOMY    . BIV PACEMAKER INSERTION CRT-P N/A 04/29/2020   Procedure: BIV PACEMAKER INSERTION CRT-P;  Surgeon: Constance Haw, MD;  Location: [place] [facility];  Service: Cardiovascular;  Laterality: N/A;  . CORONARY ARTERY BYPASS GRAFT N/A 04/25/2020   Procedure: CORONARY ARTERY BYPASS GRAFTING (CABG) TIMES TWO USING LEFT INTERNAL MAMMARY ARTERY AND RIGHT GREATER SAPHENOUS VEIN HARVESTED ENDOSCOPICALLY;  Surgeon: Melrose Nakayama, MD;  Location: [facility];  Service: Open Heart Surgery;  Laterality: N/A;  . FRACTURE SURGERY    . HIP SURGERY    . LEFT HEART CATH AND CORONARY ANGIOGRAPHY N/A 04/22/2020   Procedure: LEFT HEART CATH AND CORONARY ANGIOGRAPHY;  Surgeon: Larey Dresser, MD;  Location: [place] [facility];  Service: Cardiovascular;  Laterality: N/A;  . TEE WITHOUT CARDIOVERSION  04/25/2020   Procedure: TRANSESOPHAGEAL ECHOCARDIOGRAM (TEE);  Surgeon: Melrose Nakayama, MD;  Location: [facility] OR;  Service: Open Heart Surgery;;  . TEMPORARY PACEMAKER N/A 04/21/2020   Procedure: TEMPORARY PACEMAKER;  Surgeon: Larey Dresser, MD;  Location: [place] [facility];  Service: Cardiovascular;  Laterality: N/A;  . TUBAL LIGATION      Allergies  Allergen Reactions  . Lexiscan [Regadenoson] Other (See Comments)    Perioral tingling and throat tightness    Outpatient Encounter Medications as of  05/17/2020  Medication Sig  . acetaminophen (TYLENOL) 325 MG tablet Take 650 mg by mouth every 4 (four) hours as needed.  Marland Kitchen aspirin 81 MG chewable tablet Chew 81 mg by mouth daily.  Marland Kitchen atorvastatin (LIPITOR) 80 MG tablet TAKE 1 TABLET AT BEDTIME  . baclofen (LIORESAL) 20 MG tablet Take 20 mg by mouth 3 (three) times daily. For Muscle Spasms And at Bedtime as needed for muscle spasms  . Cholecalciferol 50 MCG (2000 UT) TABS Take 1 tablet by mouth daily in the  afternoon.  . Colchicine 0.6 MG CAPS Take 1 capsule by mouth daily in the afternoon. For GOUT  . furosemide (LASIX) 20 MG tablet Take 20 mg by mouth.  . Lacosamide 100 MG TABS Take 1 tablet by mouth 2 (two) times daily.  Marland Kitchen levETIRAcetam (KEPPRA) 750 MG tablet Take 2 tablets (1,500 mg total) by mouth 2 (two) times daily.  . metoprolol succinate (TOPROL-XL) 25 MG 24 hr tablet Take 1 tablet (25 mg total) by mouth at bedtime.  . polyethylene glycol (MIRALAX / GLYCOLAX) 17 g packet Take 17 g by mouth daily as needed.  . sacubitril-valsartan (ENTRESTO) 24-26 MG Take 1 tablet by mouth 2 (two) times daily.  Marland Kitchen spironolactone (ALDACTONE) 25 MG tablet TAKE 1/2 TABLET EVERY DAY  . traMADol (ULTRAM) 50 MG tablet Take 50 mg by mouth every 6 (six) hours as needed.  . warfarin (COUMADIN) 4 MG tablet Take 4 mg by mouth every evening.   No facility-administered encounter medications on file as of 05/17/2020.    Review of Systems unable to obtain due to aphasia    Immunization History  Administered Date(s) Administered  . Fluad Quad(high Dose 65+) 10/10/2018  . Influenza,inj,Quad PF,6+ Mos 12/12/2012, 10/23/2013, 09/30/2014, 04/05/2016  . PFIZER Comirnaty(Gray Top)Covid-19 Tri-Sucrose Vaccine 05/04/2020  . PFIZER(Purple Top)SARS-COV-2 Vaccination 03/22/2019, 04/15/2019  . Pneumococcal Conjugate-13 09/30/2014  . Pneumococcal Polysaccharide-23 04/05/2016  . Tdap 12/12/2012   Pertinent  Health Maintenance Due  Topic Date Due  . COLONOSCOPY (Pts 45-78yrs Insurance coverage will need to be confirmed)  10/07/2020 (Originally 10/21/2017)  . INFLUENZA VACCINE  08/22/2020  . MAMMOGRAM  08/03/2021  . DEXA SCAN  Completed  . PNA vac Low Risk Adult  Completed   Fall Risk  04/12/2020 03/02/2020 12/14/2019 10/08/2019 04/09/2019  Falls in the past year? 0 0 0 - 0  Number falls in past yr: 0 0 0 0 0  Injury with Fall? 0 0 0 0 0  Risk for fall due to : - - - - Impaired balance/gait;Mental status change  Follow up  Falls evaluation completed Falls evaluation completed Falls evaluation completed - -     Vitals:   05/17/20 1500  BP: 129/60  Pulse: 89  Resp: 20  Temp: 97.7 F (36.5 C)  Weight: 113 lb 6.4 oz (51.4 kg)  Height: 5\' 4"  (1.626 m)   Body mass index is 19.47 kg/m.  Physical Exam  GENERAL APPEARANCE:  In no acute distress.  SKIN:  Skin is warm and dry.  MOUTH and THROAT: Lips are without lesions. Oral mucosa is moist and without lesions.  RESPIRATORY: Breathing is even & unlabored, BS CTAB CARDIAC: RRR, no murmur,no extra heart sounds, no edema GI: +peg tube NEUROLOGICAL: There is no tremor. Aphasic. Right hemiplegia. PSYCHIATRIC:  Affect and behavior are appropriate  Labs reviewed: Recent Labs    04/25/20 2044 04/26/20 0014 04/26/20 0117 04/26/20 0811 04/26/20 1555 04/27/20 0359 05/03/20 0111 05/04/20 0232 05/13/20 1208  NA 136   < >  136   < > 137   < > 137 136 137  K 3.7   < > 4.1   < > 4.3   < > 3.5 3.9 3.9  CL 107  --  105  --  106   < > 101 105 107  CO2 22  --  22  --  25   < > 24 24 17*  GLUCOSE 110*  --  170*  --  139*   < > 115* 100* 96  BUN 12  --  13  --  13   < > 16 11 19   CREATININE 0.78  --  0.90  --  1.06*   < > 0.94 0.83 0.78  CALCIUM 8.5*  --  8.7*  --  8.2*   < > 8.6* 8.5* 8.9  MG 2.7*  --  2.9*  --  2.2  --   --   --   --    < > = values in this interval not displayed.   Recent Labs    10/08/19 1031 12/14/19 1020 04/21/20 0825  AST 24 24 28   ALT 13 9 15   ALKPHOS 161* 156* 95  BILITOT <0.2 0.3 0.4  PROT 7.3 7.5 6.5  ALBUMIN 4.2 4.3 3.3*   Recent Labs    03/02/20 1221 04/21/20 0825 04/21/20 0832 05/03/20 0111 05/04/20 0232 05/05/20 0031  WBC 6.8 9.2   < > 14.7* 12.6* 14.0*  NEUTROABS 4.0 1.7  --   --   --   --   HGB 12.8 11.1*   < > 9.6* 9.4* 9.4*  HCT 38.5 37.0   < > 29.0* 28.2* 28.5*  MCV 85 95.9   < > 87.3 87.6 88.5  PLT 303 322   < > 356 429* 533*   < > = values in this interval not displayed.   Lab Results  Component  Value Date   TSH 1.846 10/23/2013   Lab Results  Component Value Date   HGBA1C 6.1 (H) 04/22/2020   Lab Results  Component Value Date   CHOL 124 04/22/2020   HDL 41 04/22/2020   LDLCALC 64 04/22/2020   TRIG 94 04/22/2020   CHOLHDL 3.0 04/22/2020    Significant Diagnostic Results in last 30 days:  DG Chest 2 View  Result Date: 04/30/2020 CLINICAL DATA:  Pacemaker placement EXAM: CHEST - 2 VIEW COMPARISON:  April 29, 2020 FINDINGS: Pacemaker present on the left. Pacemaker leads are attached to the right atrium, right ventricle, and coronary sinus. Temporary pacemaker wires are also attached to the right heart. No evident pneumothorax. Heart is mildly enlarged with pulmonary vascularity normal. Patient is status post coronary artery bypass grafting. There is a left pleural effusion consolidation in portions of the lingula and left lower lobe. Minimal right pleural effusion. Mild right base atelectasis. There is aortic atherosclerosis. No bone lesions. IMPRESSION: Pacemaker leads as described. No pneumothorax. Mild cardiomegaly. Left pleural effusion with consolidation in portions of the left lower lobe and lingula. Suspect combination of atelectasis and pneumonia. Minimal right pleural effusion with right base atelectasis. Right lung otherwise clear. Aortic Atherosclerosis (ICD10-I70.0). Electronically Signed   By: Lowella Grip III M.D.   On: 04/30/2020 08:46   CARDIAC CATHETERIZATION  Result Date: 04/22/2020  Dist LM lesion is 70% stenosed.  Ost Cx lesion is 70% stenosed.  Prox Cx lesion is 99% stenosed.  1. LVEDP 7. 2. 60-70% distal left main stenosis. 3. Heavily calcified proximal LCx.  60-70% stenosis at  the ostium with discrete 99% stenosis in the proximal LCx.  I suspect that her CHB is related to chronic ischemia from LM-LCx disease.  Difficult situation.  She has surgical disease but seems to be a poor candidate for CABG with weakness and aphasia from prior stroke though per her  husband, she does all ADLs and walks with cane at home.  PCI would be possible but high risk/difficult heavy calcification and tortuousity.  I discussed the films with Dr. Martinique.  Will ask surgery to see for an opinion.  If they turn her down for CABG, would plan high risk PCI from LM into the proximal LCx on Monday or Tuesday.  Will need to keep TTVP in place.   CARDIAC CATHETERIZATION  Result Date: 04/21/2020 Successful temporary transvenous pacemaker placement from right femoral vein.  Left at 80 bpm, 8 mA.  Threshold 3.5 mA.   EP PPM/ICD IMPLANT  Result Date: 04/29/2020 SURGEON: Allegra Lai, MD PREPROCEDURE DIAGNOSES: 1. Ischemic cardiomyopathy. 2. New York Heart Association class II, heart failure chronically. 3. Left bundle-branch block. POSTPROCEDURE DIAGNOSES: 1. Ischemic cardiomyopathy. 2. New York Heart Association class II heart failure chronically. 3. Left bundle-branch block. PROCEDURES: 1. Left upper extremity venography 2. Biventricular pacemaker implantation. INTRODUCTION:  Iridessa Harrow [place] is a 70 y.o. female with a ischemic CM (EF 30/35%), NYHA Class II CHF, and LBBB QRS morophology.  She presented to hospital with complete heart block.  She had a left heart catheterization that showed significant coronary artery disease and now status post CABG.  She remains in complete heart block.  She thus presents for CRT-P implantation. DESCRIPTION OF PROCEDURE: Informed written consent was obtained and the patient was brought to the electrophysiology lab in the fasting state. The patient was adequately sedated with intravenous Versed, and fentanyl as outlined in the nursing report. The patient's left chest was prepped and draped in the usual sterile fashion by the EP lab staff. The skin overlying the left deltopectoral region was infiltrated with lidocaine for local analgesia. A 5-cm incision was made over the left deltopectoral region. A left subcutaneous pacemaker pocket was fashioned using a  combination of sharp and blunt dissection. Electrocautery was used to assure hemostasis. RA/RV Lead Placement: The left axillary vein was cannulated with fluoroscopic visualization. No contrast was required for this endeavor. Through the left axillary vein, and Abbott tendril MRI LPA 1200 M (serial #[id number]) right atrial lead and an Abbott tendril MRI LPA 1200 M (serial number [id number]) right ventricular pacing lead were advanced with fluoroscopic visualization into the right atrial appendage and right ventricular apex positions respectively. Initial atrial lead P-waves measured 1.7 mV with an impedance of 404 ohms and a threshold of 2.1 volts at 0.5 milliseconds. The right ventricular lead R-wave measured 10 mV with impedance of 716 ohms and a threshold of 0.8 volts at 0.5 milliseconds. LV Lead Placement: An Abbott 115 guide was advanced through the left axillary vein into the low lateral right atrium. A Bard curved Damato catheter was introduced through the guide and used to cannulate the coronary sinus.  A selective coronary sinus venogram was performed by hand injection of nonionic contrast. This demonstrated a large CS body with very small/ atretic distal branches. There was a moderate sized lateral coronary sinus branch was noted along the mid portion of the CS body. No other posterior branches were identified. A Whisper CSJ wire was introduced through the guide and advanced into the distal posterolateral branch. A Saint Jude [id number] (serial number  [facility] [id number]) lead was advanced through the MB-2 into the lateral branch. This was approximately one-thirds from the base to the apex in a very lateral position. In this location, the left ventricular lead R-waves measured 4.6 mV with impedance of 702 ohms and a threshold of 1.1 volt at 0.5 milliseconds in the bipolar LV2-LV3 configuration with no diaphragmatic stimulation observed when pacing at 10 volts output. The guide was therefore removed. All three leads  were secured to the pectoralis fascia using #2 silk suture over the suture sleeves. The pocket then irrigated with copious gentamicin solution. The leads were then connected to [place] (serial Number [id number]) device. The pacemaker was placed into the pocket. The pocket was then closed in 3 layers with 2.0 Vicryl suture for the subcutaneous and 3.0 Vicryl suture subcuticular layers. Steri-Strips and a sterile dressing were then applied. DFT testing was not performed today. The procedure was therefore considered completed. EBL<49ml. There were no early apparhent complications. CONCLUSIONS: 1. Ischemic cardiomyopathy with Left bundle-branch block and chronic New York Heart Association class II heart failure. 2. Successful biventricular pacemaker implantation. 3. No early apparent complications.   DG Chest Port 1 View  Result Date: 04/29/2020 CLINICAL DATA:  Shortness of breath. EXAM: PORTABLE CHEST 1 VIEW COMPARISON:  April 28, 2020. FINDINGS: Stable cardiomegaly. Status post coronary bypass graft. No pneumothorax is noted. Mild central pulmonary vascular congestion is noted. Bibasilar opacities are noted concerning for edema or atelectasis with associated pleural effusions, left greater than right. Bony thorax is unremarkable. IMPRESSION: Stable cardiomegaly with mild central pulmonary vascular congestion. Bibasilar opacities are noted concerning for edema or atelectasis with associated pleural effusions, left greater than right. Aortic Atherosclerosis (ICD10-I70.0). Electronically Signed   By: Marijo Conception M.D.   On: 04/29/2020 08:31   DG Chest Port 1 View  Result Date: 04/28/2020 CLINICAL DATA:  Sore chest.  Status post open heart surgery. EXAM: PORTABLE CHEST 1 VIEW COMPARISON:  04/27/2020. FINDINGS: Right IJ sheath in stable position. Prior CABG. Stable cardiomegaly. Persistent bibasilar pulmonary infiltrates/edema and bilateral pleural effusions. No pneumothorax. IMPRESSION: 1.  Right IJ  sheath in stable position. 2. Prior CABG. Stable cardiomegaly. Persistent bibasilar infiltrates/edema and bilateral pleural effusions again noted. Electronically Signed   By: Marcello Moores  Register   On: 04/28/2020 06:24   DG Chest Port 1 View  Result Date: 04/27/2020 CLINICAL DATA:  Sore chest. EXAM: PORTABLE CHEST 1 VIEW COMPARISON:  04/26/2020. FINDINGS: Interim extubation and removal of NG tube. Interim removal of mediastinal drainage catheter left chest tube. Right IJ sheath in stable position. Prior CABG. Cardiomegaly. Bibasilar atelectasis. Diffuse bilateral interstitial prominence and bilateral pleural effusions again noted suggesting CHF. Similar findings noted on prior exam. IMPRESSION: 1. Interim extubation and removal of NG tube. Interim removal of mediastinal drainage catheter and left chest tube. No pneumothorax. 2. Prior CABG. Cardiomegaly with bilateral interstitial prominence and bilateral pleural effusions again noted. Findings consistent CHF. Persistent bibasilar atelectasis. Electronically Signed   By: Marcello Moores  Register   On: 04/27/2020 05:38   DG Chest Port 1 View  Result Date: 04/26/2020 CLINICAL DATA:  Open-heart surgery. EXAM: PORTABLE CHEST 1 VIEW COMPARISON:  04/25/2020. FINDINGS: Endotracheal tube 14 mm above the carina. NG tube, right IJ sheath, mediastinal drainage catheter, left chest tube in stable position. Prior CABG. Heart size stable. Low lung volumes with bibasilar atelectasis. Bilateral mild interstitial prominence. A component of CHF cannot be excluded. Small bilateral pleural effusions noted. No pneumothorax. Peripheral vascular calcification. Mild thoracic spine  scoliosis. IMPRESSION: 1.  Endotracheal tube 14 mm above the carina. 2. NG tube, right IJ sheath, mediastinal drainage catheter, left chest tube in stable position. No pneumothorax. 3. Prior CABG. Heart size stable. Mild bilateral interstitial prominence noted on today's exam suggesting interstitial edema. Small  bilateral pleural effusions noted. Findings suggest mild CHF. Electronically Signed   By: Marcello Moores  Register   On: 04/26/2020 06:00   DG Chest Port 1 View  Result Date: 04/25/2020 CLINICAL DATA:  Post CABG EXAM: PORTABLE CHEST 1 VIEW COMPARISON:  04/21/2020 FINDINGS: Changes of CABG. Left chest tube in place. No pneumothorax. Endotracheal tube tip is less than 1 cm above the carina. NG tube is in the stomach. Right central line tip in the SVC. No pneumothorax. Heart is normal size. Mild vascular congestion and bibasilar atelectasis. IMPRESSION: Postoperative changes. Endotracheal tube just above the carina by 8 mm. Vascular congestion, bibasilar atelectasis. Electronically Signed   By: Rolm Baptise M.D.   On: 04/25/2020 18:20   DG Chest Portable 1 View  Result Date: 04/21/2020 CLINICAL DATA:  70 year old female with weakness, syncope, wide complex bradycardia. EXAM: PORTABLE CHEST 1 VIEW COMPARISON:  Portable chest 09/18/2018 and earlier. FINDINGS: Portable AP semi upright view at 0900 hours. Pacer/resuscitation pads project over the left chest. Larger lung volumes. Mediastinal contours are stable and within normal limits. Visualized tracheal air column is within normal limits. Allowing for portable technique the lungs are clear. No pneumothorax identified, linear external artifact projecting over the outer right lung. No acute osseous abnormality identified. IMPRESSION: No acute cardiopulmonary abnormality. Electronically Signed   By: Genevie Ann M.D.   On: 04/21/2020 09:08   ECHOCARDIOGRAM COMPLETE  Result Date: 04/29/2020    ECHOCARDIOGRAM REPORT   Patient Name:   Adalina MOORE [place] Date of Exam: 04/29/2020 Medical Rec #:  [id number]            Height:       67.0 in Accession #:    [id number]           Weight:       132.5 lb Date of Birth:  1950/03/30           BSA:          1.698 m Patient Age:    58 years             BP:           109/61 mmHg Patient Gender: F                    HR:           52 bpm. Exam  Location:  Inpatient Procedure: 2D Echo and Intracardiac Opacification Agent STAT ECHO Indications:    CHF; Needs echo before Pacemaker placement.  History:        Patient has prior history of Echocardiogram examinations, most                 recent 04/25/2020. CHF, Prior CABG, Stroke; Risk                 Factors:Hypertension and Dyslipidemia. Sickle Cell Anemia.  Sonographer:    Darlina Sicilian RDCS Referring Phys: Anthony  1. Left ventricular ejection fraction, by estimation, is 30 to 35%. The left ventricle has moderately decreased function. The left ventricle demonstrates regional wall motion abnormalities (see scoring diagram/findings for description). Inferior/lateral  akinesis. Left ventricular diastolic parameters are indeterminate.  2. Right ventricular systolic function  is severely reduced. The right ventricular size is normal. There is normal pulmonary artery systolic pressure.  3. The mitral valve is normal in structure. Trivial mitral valve regurgitation.  4. The aortic valve is tricuspid. Aortic valve regurgitation is moderate. Mild aortic valve sclerosis is present, with no evidence of aortic valve stenosis. FINDINGS  Left Ventricle: Left ventricular ejection fraction, by estimation, is 30 to 35%. The left ventricle has moderately decreased function. The left ventricle demonstrates regional wall motion abnormalities. Definity contrast agent was given IV to delineate the left ventricular endocardial borders. The left ventricular internal cavity size was normal in size. There is no left ventricular hypertrophy. Left ventricular diastolic parameters are indeterminate.  LV Wall Scoring: The entire lateral wall and entire inferior wall are akinetic. The apex is hypokinetic. The entire anterior wall and entire septum are normal. Right Ventricle: The right ventricular size is normal. Right vetricular wall thickness was not well visualized. Right ventricular systolic function is  severely reduced. There is normal pulmonary artery systolic pressure. The tricuspid regurgitant velocity is 2.43 m/s, and with an assumed right atrial pressure of 8 mmHg, the estimated right ventricular systolic pressure is 36.6 mmHg. Left Atrium: Left atrial size was normal in size. Right Atrium: Right atrial size was normal in size. Pericardium: There is no evidence of pericardial effusion. Mitral Valve: The mitral valve is normal in structure. Trivial mitral valve regurgitation. Tricuspid Valve: The tricuspid valve is normal in structure. Tricuspid valve regurgitation is trivial. Aortic Valve: The aortic valve is tricuspid. Aortic valve regurgitation is moderate. Aortic regurgitation PHT measures 630 msec. Mild aortic valve sclerosis is present, with no evidence of aortic valve stenosis. Pulmonic Valve: The pulmonic valve was not well visualized. Pulmonic valve regurgitation is not visualized. Aorta: The aortic root and ascending aorta are structurally normal, with no evidence of dilitation. IAS/Shunts: The interatrial septum was not well visualized.  LEFT VENTRICLE PLAX 2D LVIDd:         3.30 cm      Diastology LVIDs:         3.10 cm      LV e' medial:    7.18 cm/s LV PW:         1.00 cm      LV E/e' medial:  14.3 LV IVS:        1.00 cm      LV e' lateral:   6.69 cm/s LVOT diam:     1.90 cm      LV E/e' lateral: 15.4 LV SV:         47 LV SV Index:   28 LVOT Area:     2.84 cm  LV Volumes (MOD) LV vol d, MOD A2C: 117.0 ml LV vol d, MOD A4C: 163.0 ml LV vol s, MOD A2C: 82.6 ml LV vol s, MOD A4C: 110.0 ml LV SV MOD A2C:     34.4 ml LV SV MOD A4C:     163.0 ml LV SV MOD BP:      48.8 ml RIGHT VENTRICLE RV S prime:     5.66 cm/s LEFT ATRIUM             Index       RIGHT ATRIUM           Index LA diam:        1.50 cm 0.88 cm/m  RA Area:     11.20 cm LA Vol (A2C):   30.1 ml 17.73 ml/m RA Volume:  25.60 ml  15.08 ml/m LA Vol (A4C):   24.7 ml 14.55 ml/m LA Biplane Vol: 28.4 ml 16.73 ml/m  AORTIC VALVE LVOT Vmax:    105.00 cm/s LVOT Vmean:  61.400 cm/s LVOT VTI:    0.165 m AI PHT:      630 msec  AORTA Ao Root diam: 3.00 cm Ao Asc diam:  2.70 cm MITRAL VALVE                TRICUSPID VALVE MV Area (PHT): 6.48 cm     TR Peak grad:   23.6 mmHg MV Decel Time: 117 msec     TR Vmax:        243.00 cm/s MV E velocity: 103.00 cm/s MV A velocity: 82.50 cm/s   SHUNTS MV E/A ratio:  1.25         Systemic VTI:  0.16 m                             Systemic Diam: 1.90 cm Oswaldo Milian MD Electronically signed by Oswaldo Milian MD Signature Date/Time: 04/29/2020/9:55:55 AM    Final    ECHOCARDIOGRAM COMPLETE  Result Date: 04/21/2020    ECHOCARDIOGRAM REPORT   Patient Name:   Nashley MOORE [place] Date of Exam: 04/21/2020 Medical Rec #:  [id number]            Height:       67.0 in Accession #:    [id number]           Weight:       125.7 lb Date of Birth:  13-Feb-1950           BSA:          1.660 m Patient Age:    69 years             BP:           127/71 mmHg Patient Gender: F                    HR:           80 bpm. Exam Location:  Inpatient Procedure: 2D Echo, Cardiac Doppler and Color Doppler Indications:    I50.23 Acute on chronic systolic (congestive) heart failure  History:        Patient has prior history of Echocardiogram examinations, most                 recent 09/19/2018. CHF, Stroke; Risk Factors:Hypertension and                 Dyslipidemia. Seizures. Sickle Cell anemia.  Sonographer:    Jonelle Sidle Dance Referring Phys: [place]  1. Left ventricular ejection fraction, by estimation, is 35 to 40%. The left ventricle has moderately decreased function. The left ventricle demonstrates regional wall motion abnormalities (see scoring diagram/findings for description). Left ventricular  diastolic parameters are consistent with Grade I diastolic dysfunction (impaired relaxation). Elevated left ventricular end-diastolic pressure. There is severe hypokinesis of the left ventricular, entire inferior wall and  lateral wall.  2. Right ventricular systolic function is normal. The right ventricular size is normal. There is normal pulmonary artery systolic pressure.  3. Left atrial size was mildly dilated.  4. Right atrial size was mildly dilated.  5. The mitral valve is grossly normal. Trivial mitral valve regurgitation.  6. The aortic valve is tricuspid. There is mild calcification of the aortic valve.  There is mild thickening of the aortic valve. Aortic valve regurgitation is moderate. Mild to moderate aortic valve sclerosis/calcification is present, without any evidence of aortic stenosis.  7. The inferior vena cava is normal in size with greater than 50% respiratory variability, suggesting right atrial pressure of 3 mmHg. Comparison(s): No significant change from prior study. FINDINGS  Left Ventricle: Left ventricular ejection fraction, by estimation, is 35 to 40%. The left ventricle has moderately decreased function. The left ventricle demonstrates regional wall motion abnormalities. Severe hypokinesis of the left ventricular, entire  inferior wall and lateral wall. The left ventricular internal cavity size was normal in size. There is no left ventricular hypertrophy. Abnormal (paradoxical) septal motion, consistent with RV pacemaker. Left ventricular diastolic parameters are consistent with Grade I diastolic dysfunction (impaired relaxation). Elevated left ventricular end-diastolic pressure. Right Ventricle: The right ventricular size is normal. No increase in right ventricular wall thickness. Right ventricular systolic function is normal. There is normal pulmonary artery systolic pressure. The tricuspid regurgitant velocity is 2.13 m/s, and  with an assumed right atrial pressure of 3 mmHg, the estimated right ventricular systolic pressure is 123456 mmHg. Left Atrium: Left atrial size was mildly dilated. Right Atrium: Right atrial size was mildly dilated. Pericardium: There is no evidence of pericardial effusion. Mitral  Valve: The mitral valve is grossly normal. There is mild thickening of the mitral valve leaflet(s). There is mild calcification of the mitral valve leaflet(s). Trivial mitral valve regurgitation. Tricuspid Valve: The tricuspid valve is normal in structure. Tricuspid valve regurgitation is trivial. Aortic Valve: The aortic valve is tricuspid. There is mild calcification of the aortic valve. There is mild thickening of the aortic valve. Aortic valve regurgitation is moderate. Aortic regurgitation PHT measures 351 msec. Mild to moderate aortic valve sclerosis/calcification is present, without any evidence of aortic stenosis. Pulmonic Valve: The pulmonic valve was not well visualized. Pulmonic valve regurgitation is not visualized. Aorta: The aortic root, ascending aorta and aortic arch are all structurally normal, with no evidence of dilitation or obstruction. Venous: The inferior vena cava is normal in size with greater than 50% respiratory variability, suggesting right atrial pressure of 3 mmHg. IAS/Shunts: The atrial septum is grossly normal. Additional Comments: A device lead is visualized.  LEFT VENTRICLE PLAX 2D LVIDd:         3.50 cm  Diastology LVIDs:         2.50 cm  LV e' medial:    4.03 cm/s LV PW:         1.20 cm  LV E/e' medial:  19.0 LV IVS:        1.00 cm  LV e' lateral:   3.50 cm/s LVOT diam:     1.80 cm  LV E/e' lateral: 21.9 LV SV:         32 LV SV Index:   19 LVOT Area:     2.54 cm                          3D Volume EF:                         3D EF:        37 % RIGHT VENTRICLE            IVC RV Basal diam:  2.80 cm    IVC diam: 2.00 cm RV S prime:     9.99 cm/s TAPSE (M-mode): 1.9 cm  LEFT ATRIUM             Index       RIGHT ATRIUM           Index LA diam:        2.70 cm 1.63 cm/m  RA Area:     13.20 cm LA Vol (A2C):   46.6 ml 28.08 ml/m RA Volume:   29.90 ml  18.01 ml/m LA Vol (A4C):   33.7 ml 20.30 ml/m LA Biplane Vol: 39.7 ml 23.92 ml/m  AORTIC VALVE LVOT Vmax:   73.80 cm/s LVOT Vmean:   45.300 cm/s LVOT VTI:    0.127 m AI PHT:      351 msec  AORTA Ao Root diam: 3.10 cm Ao Asc diam:  3.00 cm MITRAL VALVE                TRICUSPID VALVE MV Area (PHT): 3.77 cm     TR Peak grad:   18.1 mmHg MV Decel Time: 201 msec     TR Vmax:        213.00 cm/s MV E velocity: 76.70 cm/s MV A velocity: 108.00 cm/s  SHUNTS MV E/A ratio:  0.71         Systemic VTI:  0.13 m                             Systemic Diam: 1.80 cm Buford Dresser MD Electronically signed by Buford Dresser MD Signature Date/Time: 04/21/2020/9:06:37 PM    Final    CUP PACEART INCLINIC DEVICE CHECK  Result Date: 05/12/2020 Wound check appointment. Steri-strips removed. Wound without redness or edema. Incision edges approximated, wound well healed. Normal device function. RA/RV threshold, RA/RV/LV sensing, and impedances consistent with implant measurements. LV threshold increased from 1.1V@0 .42ms at implant to 3.5V@0 .35ms. Industry consulted after threshold remained 2.5V@1 .70ms.  Vectors tested yielding phrenic nerve stimulation from 1 to 2. Best threshold of 2.0@0 .5 achieved with 2-Can programming. No phrenic nerve stimulation noted. LV cap confirm suggested and turned on. At implant device programmed at 3.5V/5.0V for RA/RV safety margins for newly implanted leads. Histogram distribution appropriate for patient and level of activity. No mode switches or high ventricular rates noted. Patient/spouse/SNF educated about wound care, arm mobility, lifting restrictions. Confirmed receipt of remote transmissions. Next 08/01/20. ROV with Dr. Curt Bears 08/09/20. Error in saving all of attachment.Portia E. Rollene Rotunda, BSN, RN  ECHO INTRAOPERATIVE TEE  Result Date: 04/25/2020  *INTRAOPERATIVE TRANSESOPHAGEAL REPORT *  Patient Name:   Pacey MOORE [place] Date of Exam: 04/25/2020 Medical Rec #:  [id number]            Height:       67.0 in Accession #:    [id number]           Weight:       119.7 lb Date of Birth:  05-25-1950           BSA:          1.63 m  Patient Age:    77 years             BP:           113/55 mmHg Patient Gender: F                    HR:           76 bpm. Exam Location:  Anesthesiology Transesophogeal exam was perform intraoperatively during surgical procedure. Patient  was closely monitored under general anesthesia during the entirety of examination. Indications:     Coronary Artery Disease Sonographer:     Bernadene Person RDCS Performing Phys: [place] Diagnosing Phys: Annye Asa MD PROCEDURE: Intraoperative Transesophogeal Teeth intact after easy TEE probe insertion. Complications: No known complications during this procedure. POST-OP IMPRESSIONS Limited Post CPB exam: The patient separated from CPB easily with low MIlrinone support. - Left Ventricle: The overall left ventricular is essentially unchanged from pre-bypass images. There is some interval improvement in the basal region, and also in that infero-septal wall. Overall EF 45-50%. - Right Ventricle: The right ventricular function appears unchanged from pre-bypass images. - Aortic Valve: The aortic valve function appears unchanged from pre-bypass images. Mild, anterior directed AI is present. - Mitral Valve: The mitral valve function appears unchanged from pre-bypass images. - Tricuspid Valve: The tricuspid valve function appears unchanged from pre-bypass images. PRE-OP FINDINGS  Left Ventricle: The left ventricle has mild-moderately reduced systolic function, with an ejection fraction of 40-45%, measured 40%. The cavity size was mildly dilated. Left ventricular has diffuse hypokinesis, with Severe hypokinesis of the Basal inferoseptal wall and entire basal region. There is no left ventricular hypertrophy. Left ventricular diastolic function was not evaluated. Right Ventricle: The right ventricle has normal systolic function. The cavity was normal. There is no increase in right ventricular wall thickness. Left Atrium: Left atrial size was normal in size. No left  atrial/left atrial appendage thrombus was detected. Left atrial appendage velocity is normal at greater than 40 cm/s. Right Atrium: Right atrial size was normal in size. Catheter present traversing through the right atrium. Interatrial Septum: No atrial level shunt detected by color flow Doppler. There is left bowing of the interatrial septum, suggestive of elevated right atrial pressure. There is no evidence of a patent foramen ovale by color doppler interrogation. Pericardium: There is no evidence of pericardial effusion. Mitral Valve: The mitral valve is normal in structure. Mitral valve regurgitation is trivial by color flow Doppler. There is no evidence of mitral valve vegetation. There is no evidence of mitral stenosis, with peak gradient 3 mmHg, mean gradient 1 mmHg. Tricuspid Valve: The tricuspid valve was normal in structure. Tricuspid valve regurgitation is trivial by color flow Doppler. No evidence of tricuspid stenosis is present. There is no evidence of tricuspid valve vegetation. Aortic Valve: The aortic valve is tricuspid. Aortic valve regurgitation is mild by color flow Doppler. The jet is anteriorly-directed toward the anterior leaflet of the mitral valve. There is no stenosis of the aortic valve, with peak gradient 8 mmHg, mean gradient 4 mmHg. There is no evidence of aortic valve vegetation. Pulmonic Valve: The pulmonic valve was normal in structure, with normal leaflet excursion. No evidence of pulmonic stenosis. Pulmonic valve regurgitation is trivial, around the PA catheter, by color flow Doppler. Aorta: There is evidence of calcification at the Sinotubular ridge and in the proximal ascending aorta. There is scattered moderate plaque in the aortic arch and descending aortic.Marland Kitchen Pulmonary Artery: Gordy Councilman catheter present on the right. The pulmonary artery is of normal size. Venous: The inferior vena cava is normal in size with less than 50% respiratory variability, suggesting right atrial  pressure of 8 mmHg. Shunts: There is no evidence of an atrial septal defect. +-------------+--------++ AORTIC VALVE          +-------------+--------++ AV Mean Grad:4.0 mmHg +-------------+--------++ +-------------+--------++ MITRAL VALVE          +-------------+--------++ MV Mean grad:1.0 mmHg +-------------+--------++  Rogelio Seen  Glennon Mac MD Electronically signed by Annye Asa MD Signature Date/Time: 04/25/2020/5:27:32 PM    Final    VAS US DOPPLER PRE CABG  Result Date: 04/25/2020 PREOPERATIVE VASCULAR EVALUATION  Indications:      Pre-CABG. 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 Other Factors:    Sickle Cell Disease, CHF, seizure disorder. Limitations:      Right arm is contracted Comparison Study: No prior studies Performing Technologist: Sharion Dove RVS  Examination Guidelines: A complete evaluation includes B-mode imaging, spectral Doppler, color Doppler, and power Doppler as needed of all accessible portions of each vessel. Bilateral testing is considered an integral part of a complete examination. Limited examinations for reoccurring indications may be performed as noted.  Right Carotid Findings: +----------+--------+--------+--------+------------+--------+           PSV cm/sEDV cm/sStenosisDescribe    Comments +----------+--------+--------+--------+------------+--------+ CCA Prox  85      14                                   +----------+--------+--------+--------+------------+--------+ CCA Distal75      20              heterogenous         +----------+--------+--------+--------+------------+--------+ ICA Prox  96      17      1-39%   heterogenous         +----------+--------+--------+--------+------------+--------+ ICA Distal91      28                                   +----------+--------+--------+--------+------------+--------+ ECA        80      11                                   +----------+--------+--------+--------+------------+--------+ Portions of this table do not appear on this page. +----------+--------+-------+--------+------------+           PSV cm/sEDV cmsDescribeArm Pressure +----------+--------+-------+--------+------------+ Subclavian95                                  +----------+--------+-------+--------+------------+ +---------+--------+--+--------+--+ VertebralPSV cm/s75EDV cm/s12 +---------+--------+--+--------+--+ Left Carotid Findings: +----------+--------+--------+--------+----------------------+---------+           PSV cm/sEDV cm/sStenosisDescribe              Comments  +----------+--------+--------+--------+----------------------+---------+ CCA Prox  79      12              heterogenous                    +----------+--------+--------+--------+----------------------+---------+ CCA Distal80      9               irregular and calcific          +----------+--------+--------+--------+----------------------+---------+ ICA Prox  124     21      1-39%   heterogenous          Shadowing +----------+--------+--------+--------+----------------------+---------+ ICA Distal85      22                                              +----------+--------+--------+--------+----------------------+---------+  ECA       87      15                                              +----------+--------+--------+--------+----------------------+---------+ +----------+--------+--------+--------+------------+ SubclavianPSV cm/sEDV cm/sDescribeArm Pressure +----------+--------+--------+--------+------------+           90      1                            +----------+--------+--------+--------+------------+ +---------+--------+--+--------+--+ VertebralPSV cm/s90EDV cm/s13 +---------+--------+--+--------+--+  ABI Findings:  +--------+------------------+-----+------------------+--------------+ Right   Rt Pressure (mmHg)IndexWaveform          Comment        +--------+------------------+-----+------------------+--------------+ [id number]                                      contracted arm +--------+------------------+-----+------------------+--------------+ PTA     254               2.07 monophasic                       +--------+------------------+-----+------------------+--------------+ DP                             unable to insonate               +--------+------------------+-----+------------------+--------------+ +--------+------------------+-----+-----------+-------+ Left    Lt Pressure (mmHg)IndexWaveform   Comment +--------+------------------+-----+-----------+-------+ [id number]                    multiphasic        +--------+------------------+-----+-----------+-------+ PTA     112               0.91 monophasic         +--------+------------------+-----+-----------+-------+ DP      64                0.52 monophasic         +--------+------------------+-----+-----------+-------+ +-------+---------------+----------------+ ABI/TBIToday's ABI/TBIPrevious ABI/TBI +-------+---------------+----------------+ Right  2.07                            +-------+---------------+----------------+ Left   0.91                            +-------+---------------+----------------+  Right Doppler Findings: +-----------+--------+-----+-------+--------------+ Site       PressureIndexDopplerComments       +-----------+--------+-----+-------+--------------+ Brachial   123                 contracted arm +-----------+--------+-----+-------+--------------+ Radial                         contracted arm +-----------+--------+-----+-------+--------------+ Ulnar                          contracted arm +-----------+--------+-----+-------+--------------+ Palmar Arch                     contracted arm +-----------+--------+-----+-------+--------------+  Left Doppler Findings: +--------+--------+-----+-----------+--------+ Site    PressureIndexDoppler    Comments +--------+--------+-----+-----------+--------+ [id number]  multiphasic         +--------+--------+-----+-----------+--------+ Radial               multiphasic         +--------+--------+-----+-----------+--------+ Ulnar                multiphasic         +--------+--------+-----+-----------+--------+  Summary: Right Carotid: Velocities in the right ICA are consistent with a 1-39% stenosis. Left Carotid: Velocities in the left ICA are consistent with a 1-39% stenosis. Vertebrals:  Bilateral vertebral arteries demonstrate antegrade flow. Subclavians: Normal flow hemodynamics were seen in bilateral subclavian              arteries. Right ABI: Resting right ankle-brachial index indicates noncompressible right lower extremity arteries. Left ABI: Resting left ankle-brachial index indicates mild left lower extremity arterial disease. Right Upper Extremity: Patient has contracted arm, unable to perform testing Left Upper Extremity: Doppler waveforms decrease <50% w left radial compression. Doppler waveform obliterate with left ulnar compression.  Electronically signed by Ruta Hinds MD on 04/25/2020 at 3:02:47 PM.    Final    [place] EKG SITE RITE  Result Date: 04/30/2020 If Site Rite image not attached, placement could not be confirmed due to current cardiac rhythm.   Assessment/Plan  1. Long term current use of anticoagulant -  INR 1.3, subtherapeutic -  Will give Coumadin 5 mg PO daily X 2 days -  Re-check INR on 05/19/20  2. History of CVA (cerebrovascular accident) -  Stable, continue Coumadin and Atorvastatin    Family/ staff Communication: Discussed plan of care with resident and charge nurse.  Labs/tests ordered: INR on 05/19/2020  Goals of care:   Short-term care   Durenda Age, DNP, MSN, FNP-BC [facility] [phone] (Monday-Friday 8:00 a.m. - 5:00 p.m.) [phone] (after hours)

## 2020-05-18 ENCOUNTER — Telehealth: Payer: Self-pay | Admitting: Cardiovascular Disease

## 2020-05-18 NOTE — Telephone Encounter (Signed)
Pt is requesting a provider switch from Dr. Oval [name] to Dr. Marlou Porch

## 2020-05-23 LAB — POCT INR: INR: 2.2 — AB (ref <=1.1)

## 2020-05-26 ENCOUNTER — Non-Acute Institutional Stay (SKILLED_NURSING_FACILITY): Payer: Medicare HMO | Admitting: Internal Medicine

## 2020-05-26 ENCOUNTER — Encounter: Payer: Self-pay | Admitting: Internal Medicine

## 2020-05-26 DIAGNOSIS — G40909 Epilepsy, unspecified, not intractable, without status epilepticus: Secondary | ICD-10-CM | POA: Diagnosis not present

## 2020-05-26 DIAGNOSIS — R29818 Other symptoms and signs involving the nervous system: Secondary | ICD-10-CM | POA: Diagnosis not present

## 2020-05-26 DIAGNOSIS — Z7901 Long term (current) use of anticoagulants: Secondary | ICD-10-CM | POA: Diagnosis not present

## 2020-05-26 DIAGNOSIS — R4189 Other symptoms and signs involving cognitive functions and awareness: Secondary | ICD-10-CM

## 2020-05-26 DIAGNOSIS — I2510 Atherosclerotic heart disease of native coronary artery without angina pectoris: Secondary | ICD-10-CM

## 2020-05-26 NOTE — Assessment & Plan Note (Addendum)
PT/INR @ goal with value of 2.0; continue present dose of warfarin. Critical need to guarantee warfarin compliance discussed with husband.

## 2020-05-26 NOTE — Assessment & Plan Note (Signed)
Patient continues to deny any active cardiopulmonary symptoms; but neurocognitive deficit is suggestive of vascular dementia.

## 2020-05-26 NOTE — Assessment & Plan Note (Addendum)
No seizure activity reported at the SNF.Vimpat &   Keppra will be continued.

## 2020-05-26 NOTE — Assessment & Plan Note (Addendum)
PCP to reassess mental status post discharge when seen in follow up

## 2020-05-26 NOTE — Patient Instructions (Addendum)
See assessment and plan under each diagnosis in the problem list and acutely for this visit 05/26/2020 copy of current med list with clinical indication for each medication given to her Nurse to give husband @ discharge 5/8.  I did not refill baclofen as it is on the [place]' List with increased risk of falls.  This can be discussed with her PCP.  [name] is prescribed by Dr. Aundra Dubin. PT/INR was 2.0 on 5/5.  Warfarin (Coumadin) will be continued 4 mg daily with repeat PT/INR as scheduled at the Coumadin clinic.

## 2020-05-26 NOTE — Progress Notes (Signed)
NURSING HOME LOCATION:  [facility] ROOM NUMBER:  [id number]  CODE STATUS:  FULL CODE  PCP:  Burnard Bunting MD,[name]:Dalton Aundra Dubin MD  The patient is being discharged from [facility] on Sunday May 8,2022 by Unice Cobble MD.  The medical history in this facility was reviewed and summarized and medical problem list was updated. Time spent and note content is documented as follows.  Summary of [facility] medical records: She was hospitalized 3/31 - 05/05/2020 admitted after what was thought to be a seizure as per her husband's history.  This was described as her making gurgling sounds and slumping into a chair.  EMS documented complete heart block upon arrival.  Percutaneous pacing was provided in route to the ED.  Transvenous lead was placed in the ED.  Her digoxin and carvedilol were held.  In the ED she exhibited 1to1 conduction.  Cardiology consulted & performed cath which demonstrated 70% distal left main coronary artery stenosis and 70% stenosis in the proximal circumflex.  Echo documented EF of 35%.  Moderate aortic valve sclerosis without significant stenosis was present. CABG x2 was performed using LIMA to LAD and SVG to OM on 4/4.  BiV pacemaker was placed 4/8 Course was complicated by acute blood loss anemia as expected.  Anemia did stabilize; on 4/14 H/H were 9.4/28.5.  Warfarin was initiated with an INR goal of 2-3.  Here at the SNF there have been no significant complications or acute issues.  She did intermittently refuse medications, most recently 5/2.  When I informed her husband of this, he stated that she must be told why she needs the medications.  Over the last 11 days nutritionist is noted that she has lost 12.6 pounds which she postulates as fluid loss.  Review of systems: Patient denies any active symptoms with emphasis on cardiopulmonary symptomatology.She is essentially aphasic & responds by  shaking her head side to  side . Significant neurocognitive deficit is suggested as she was unable to follow commands.  Negative ROS: Constitutional: No fever, significant weight change, fatigue  Eyes: No redness, discharge, pain, vision change ENT/mouth: No nasal congestion, purulent discharge, earache, change in hearing, sore throat  Cardiovascular: No chest pain, palpitations, paroxysmal nocturnal dyspnea, claudication, edema  Respiratory: No cough, sputum production, hemoptysis, DOE, significant snoring, apnea   Gastrointestinal: No heartburn, dysphagia, abdominal pain, nausea /vomiting, rectal bleeding, melena, change in bowels Genitourinary: No dysuria, hematuria, pyuria, incontinence, nocturia Musculoskeletal: No joint stiffness, joint swelling, weakness, pain Dermatologic: No rash, pruritus, change in appearance of skin Neurologic: No dizziness, headache Psychiatric: No significant anxiety, depression, insomnia, anorexia Endocrine: No change in hair/skin/nails, excessive thirst, excessive hunger, excessive urination  Hematologic/lymphatic: No significant bruising, lymphadenopathy, abnormal bleeding  Physical exam:  Pertinent or positive findings: She appears somewhat chronically ill and suboptimally nourished.  She was essentially aphasic with minimal verbalization.  Dental hygiene is extremely poor with missing teeth , malalignment and thick plaque formation.  Slight tach was suggested. CABG op scar w/o cellulitis. Breath sounds are decreased anteriorly.  Pedal pulses are decreased. Limb atrophy present. Dropfoot is present on the right with clawing of the toes.  The right foot is slightly cooler to palpation compared to the left.  The right upper extremity is flexed across the abdomen and the hand is gloved.  Right upper extremity is limp and drops to the body when lifted.  She was able to raise the left upper and left lower extremities but would not oppose my  hand to test strength in these extremities.  General  appearance: no acute distress, increased work of breathing is present.   Lymphatic: No lymphadenopathy about the head, neck, axilla. Eyes: No conjunctival inflammation or lid edema is present. There is no scleral icterus. Ears:  External ear exam shows no significant lesions or deformities.   Nose:  External nasal examination shows no deformity or inflammation. Nasal mucosa are pink and moist without lesions, exudates Oral exam: Lips and gums are healthy appearing.There is no oropharyngeal erythema or exudate. Neck:  No thyromegaly, masses, tenderness noted.    Heart:  No murmur, click, rub.  Lungs:  without wheezes, rhonchi, rales, rubs. Abdomen: Bowel sounds are normal. Abdomen is soft and nontender with no organomegaly, hernias, masses. GU: Deferred as previously addressed. Extremities:  No cyanosis, clubbing, edema  Neurologic exam: Balance, Rhomberg, finger to nose testing could not be completed due to clinical state Skin: Warm & dry w/o tenting. No visible significant lesions or rash.  See clinical summary of Discharge Diagnoses in the Problem List with associated updated therapeutic plan  Copy of Med List was provided to her Nurse to give to her husband (He & I  had discussed her meds,but he had left before I got back to her room with actual print out). Follow-up will be by the primary care physician in seven - 14 days.

## 2020-05-30 ENCOUNTER — Other Ambulatory Visit: Payer: Self-pay | Admitting: Thoracic Surgery (Cardiothoracic Vascular Surgery)

## 2020-05-30 ENCOUNTER — Other Ambulatory Visit: Payer: Self-pay | Admitting: Internal Medicine

## 2020-05-30 DIAGNOSIS — Z8673 Personal history of transient ischemic attack (TIA), and cerebral infarction without residual deficits: Secondary | ICD-10-CM

## 2020-05-30 DIAGNOSIS — Z951 Presence of aortocoronary bypass graft: Secondary | ICD-10-CM

## 2020-05-30 DIAGNOSIS — I110 Hypertensive heart disease with heart failure: Secondary | ICD-10-CM | POA: Diagnosis not present

## 2020-05-30 DIAGNOSIS — I69391 Dysphagia following cerebral infarction: Secondary | ICD-10-CM | POA: Diagnosis not present

## 2020-05-30 DIAGNOSIS — I5042 Chronic combined systolic (congestive) and diastolic (congestive) heart failure: Secondary | ICD-10-CM

## 2020-05-30 DIAGNOSIS — I429 Cardiomyopathy, unspecified: Secondary | ICD-10-CM

## 2020-05-30 DIAGNOSIS — I69351 Hemiplegia and hemiparesis following cerebral infarction affecting right dominant side: Secondary | ICD-10-CM | POA: Diagnosis not present

## 2020-05-30 DIAGNOSIS — I2510 Atherosclerotic heart disease of native coronary artery without angina pectoris: Secondary | ICD-10-CM | POA: Diagnosis not present

## 2020-05-30 DIAGNOSIS — Z48812 Encounter for surgical aftercare following surgery on the circulatory system: Secondary | ICD-10-CM | POA: Diagnosis not present

## 2020-05-30 DIAGNOSIS — R1311 Dysphagia, oral phase: Secondary | ICD-10-CM | POA: Diagnosis not present

## 2020-05-30 DIAGNOSIS — I442 Atrioventricular block, complete: Secondary | ICD-10-CM | POA: Diagnosis not present

## 2020-05-30 DIAGNOSIS — I69320 Aphasia following cerebral infarction: Secondary | ICD-10-CM | POA: Diagnosis not present

## 2020-05-31 ENCOUNTER — Ambulatory Visit
Admission: RE | Admit: 2020-05-31 | Discharge: 2020-05-31 | Disposition: A | Discharge status: Home / Self Care | Payer: Medicare HMO | Source: Ambulatory Visit | Attending: Thoracic Surgery (Cardiothoracic Vascular Surgery) | Admitting: Thoracic Surgery (Cardiothoracic Vascular Surgery)

## 2020-05-31 ENCOUNTER — Encounter: Payer: Self-pay | Admitting: Thoracic Surgery (Cardiothoracic Vascular Surgery)

## 2020-05-31 ENCOUNTER — Ambulatory Visit (INDEPENDENT_AMBULATORY_CARE_PROVIDER_SITE_OTHER): Payer: Self-pay | Admitting: Thoracic Surgery (Cardiothoracic Vascular Surgery)

## 2020-05-31 ENCOUNTER — Other Ambulatory Visit: Payer: Self-pay | Admitting: Thoracic Surgery (Cardiothoracic Vascular Surgery)

## 2020-05-31 ENCOUNTER — Telehealth: Payer: Self-pay | Admitting: Cardiology

## 2020-05-31 ENCOUNTER — Other Ambulatory Visit: Payer: Self-pay

## 2020-05-31 VITALS — BP 114/69 | HR 92 | Resp 20 | Ht 64.0 in | Wt 118.0 lb

## 2020-05-31 DIAGNOSIS — Z951 Presence of aortocoronary bypass graft: Secondary | ICD-10-CM

## 2020-05-31 DIAGNOSIS — R059 Cough, unspecified: Secondary | ICD-10-CM | POA: Diagnosis not present

## 2020-05-31 MED ORDER — METOPROLOL SUCCINATE ER 25 MG PO TB24: 25.0000 mg | ORAL_TABLET | Freq: Every day | ORAL | 6 refills | Status: DC

## 2020-05-31 MED ORDER — ASPIRIN 81 MG PO CHEW: 81.0000 mg | CHEWABLE_TABLET | Freq: Every day | ORAL | 6 refills | Status: DC

## 2020-05-31 NOTE — Telephone Encounter (Signed)
Urgent referral came in for this pt to follow up with Dr. Marlou Porch. Patient is switching from Dr. Oval [name] to Dr. Marlou Porch and switch has been approved. She is scheduled for 05/18 with Almyra Deforest, should we attach referral to that appointment or schedule at [facility]? Diagnosis is "Z95.1 (ICD-10-CM) - S/P CABG x 4"

## 2020-05-31 NOTE — Progress Notes (Signed)
[address]       [place] [address]             [phone]     HPI: [name] returns for a scheduled postoperative follow-up visit  [name] is a 70 year old woman with a history of CVA with right hemibody plegia and aphasia, seizure disorder, hypertension, dyslipidemia, and sickle cell anemia.  She also had known cardiomyopathy with chronic combined diastolic and systolic heart failure.  She had an episode where it was thought to be a seizure initially but when EMS was summoned she was in heart block.  Further work-up revealed left main disease with ejection fraction of 35%.  She underwent coronary bypass grafting x2 on 04/25/2020.  She then had a pacemaker placed on 04/29/2020.  She developed some gout in her ankle which was treated with colchicine.  Otherwise she did well although her progress was relatively slow due to her physical limitations.  She went to rehab initially but now is at home.  Her husband brought her to her visit.  She says she is doing well.  She denies pain.  No problems with her incisions.  Past Medical History:  Diagnosis Date  . Adenomatous polyp of colon 09/2012   repeat colonoscopy in 5 years by Dr. Sharlett Iles  . CHF (congestive heart failure) (HCC)    EF 15-20% as of 05/28/11 (Dr Legrand [facility] D[facility] summary)  . CVA (cerebral infarction) 12/2007  . Hemiparesis ([place])   . Hyperlipidemia   . Hypertension   . Seizures ([place])   . Sickle cell anemia (HCC)   . Stroke ([place])   . Vitamin D deficiency 2010    Current Outpatient Medications  Medication Sig Dispense Refill  . acetaminophen (TYLENOL) 325 MG tablet Take 650 mg by mouth every 4 (four) hours as needed.    Marland Kitchen atorvastatin (LIPITOR) 80 MG tablet TAKE 1 TABLET AT BEDTIME 90 tablet 2  . baclofen (LIORESAL) 20 MG tablet Take 20 mg by mouth 3 (three) times daily. For Muscle Spasms And at Bedtime as needed for muscle spasms    . baclofen (LIORESAL) 20 MG tablet Take 20 mg by  mouth as needed for muscle spasms.    . bisacodyl (DULCOLAX) 10 MG suppository Place 10 mg rectally as needed for moderate constipation.    . Cholecalciferol 50 MCG (2000 UT) TABS Take 1 tablet by mouth daily in the afternoon.    . Colchicine 0.6 MG CAPS Take 1 capsule by mouth daily in the afternoon. For GOUT    . Lacosamide 100 MG TABS Take 1 tablet by mouth 2 (two) times daily.    Marland Kitchen levETIRAcetam (KEPPRA) 750 MG tablet Take 2 tablets (1,500 mg total) by mouth 2 (two) times daily. 360 tablet 4  . Magnesium Hydroxide (MILK OF MAGNESIA PO) Take 30 mLs by mouth as needed.    . metoprolol succinate (TOPROL XL) 25 MG 24 hr tablet Take 1 tablet (25 mg total) by mouth daily. 30 tablet 6  . polyethylene glycol (MIRALAX / GLYCOLAX) 17 g packet Take 17 g by mouth daily as needed.    . sacubitril-valsartan (ENTRESTO) 24-26 MG Take 1 tablet by mouth 2 (two) times daily. 180 tablet 3  . Sodium Phosphates (RA SALINE ENEMA RE) Place rectally as needed.    Marland Kitchen spironolactone (ALDACTONE) 25 MG tablet TAKE 1/2 TABLET EVERY DAY 45 tablet 1  . traMADol (ULTRAM) 50 MG tablet Take 50 mg by mouth every 6 (  six) hours as needed.    . warfarin (COUMADIN) 4 MG tablet Take 4 mg by mouth every evening.    Marland Kitchen aspirin 81 MG chewable tablet Chew 1 tablet (81 mg total) by mouth daily. 30 tablet 6  . furosemide (LASIX) 20 MG tablet Take 20 mg by mouth. (Patient not taking: Reported on 05/31/2020)    . metoprolol succinate (TOPROL-XL) 25 MG 24 hr tablet Take 1 tablet (25 mg total) by mouth at bedtime. (Patient not taking: Reported on 05/31/2020) 60 tablet 2   No current facility-administered medications for this visit.    Physical Exam BP 114/69 (BP Location: Left Arm, Patient Position: Sitting, Cuff Size: Normal)   Pulse 92   Resp 20   Ht 5\' 4"  (1.626 m)   Wt 118 lb (53.5 kg)   SpO2 100% Comment: RA  BMI 20.13 kg/m  70 year old woman in no acute distress Alert, expressive aphasia and right hemiparesis Sternal incision  clean dry and intact, sternum stable Cardiac regular rate and rhythm Lungs clear with equal breath sounds bilaterally Leg incision well-healed  Diagnostic Tests: I personally reviewed her chest x-ray.  Shows postoperative changes from CABG and pacemaker placement no effusion or infiltrate.  Impression: [name] is a 70 year old woman with a complex medical history including CVA with right hemibody plegia and aphasia, seizure disorder, hypertension, dyslipidemia, sickle cell anemia, left main CAD, cardiomyopathy with chronic combined diastolic and systolic heart failure and complete heart block.   CAD-status post CABG x2 about 6 weeks ago.  She is doing well at this point in time.  Incisions have healed nicely.  No recurrent episodes.  Denies chest pain.  Complete heart block-pacemaker placed on 04/29/2020.  There were some medications missing.  She was supposed to be on a baby aspirin.  She has not been receiving that.  I put a prescription in for that.  She is on Coumadin 4 mg daily.  She also did not have a prescription for her Toprol-XL 25 mg daily.  I gave her a prescription for that as well.  Plan: Follow-up with cardiology.  She was previously seeing Dr. Oval [name], now requesting Dr. Marlou Porch due to location . I will be happy to see her back anytime in the future if I can be of any further assistance with her care  Melrose Nakayama, MD [facility] [phone]

## 2020-06-01 DIAGNOSIS — I69391 Dysphagia following cerebral infarction: Secondary | ICD-10-CM | POA: Diagnosis not present

## 2020-06-01 DIAGNOSIS — I442 Atrioventricular block, complete: Secondary | ICD-10-CM | POA: Diagnosis not present

## 2020-06-01 DIAGNOSIS — I2510 Atherosclerotic heart disease of native coronary artery without angina pectoris: Secondary | ICD-10-CM | POA: Diagnosis not present

## 2020-06-01 DIAGNOSIS — I69351 Hemiplegia and hemiparesis following cerebral infarction affecting right dominant side: Secondary | ICD-10-CM | POA: Diagnosis not present

## 2020-06-01 DIAGNOSIS — Z48812 Encounter for surgical aftercare following surgery on the circulatory system: Secondary | ICD-10-CM | POA: Diagnosis not present

## 2020-06-01 DIAGNOSIS — I5042 Chronic combined systolic (congestive) and diastolic (congestive) heart failure: Secondary | ICD-10-CM | POA: Diagnosis not present

## 2020-06-01 DIAGNOSIS — R1311 Dysphagia, oral phase: Secondary | ICD-10-CM | POA: Diagnosis not present

## 2020-06-01 DIAGNOSIS — I69320 Aphasia following cerebral infarction: Secondary | ICD-10-CM | POA: Diagnosis not present

## 2020-06-01 DIAGNOSIS — I110 Hypertensive heart disease with heart failure: Secondary | ICD-10-CM | POA: Diagnosis not present

## 2020-06-01 NOTE — Telephone Encounter (Signed)
Please attach the referral to the appt with Almyra Deforest.  Dr Marlou Porch does not have availability within the time frame the pt needs to be seen.  Thank you.

## 2020-06-02 DIAGNOSIS — I69351 Hemiplegia and hemiparesis following cerebral infarction affecting right dominant side: Secondary | ICD-10-CM | POA: Diagnosis not present

## 2020-06-02 DIAGNOSIS — I69320 Aphasia following cerebral infarction: Secondary | ICD-10-CM | POA: Diagnosis not present

## 2020-06-02 DIAGNOSIS — Z48812 Encounter for surgical aftercare following surgery on the circulatory system: Secondary | ICD-10-CM | POA: Diagnosis not present

## 2020-06-02 DIAGNOSIS — I5042 Chronic combined systolic (congestive) and diastolic (congestive) heart failure: Secondary | ICD-10-CM | POA: Diagnosis not present

## 2020-06-02 DIAGNOSIS — I69391 Dysphagia following cerebral infarction: Secondary | ICD-10-CM | POA: Diagnosis not present

## 2020-06-02 DIAGNOSIS — R1311 Dysphagia, oral phase: Secondary | ICD-10-CM | POA: Diagnosis not present

## 2020-06-02 DIAGNOSIS — I442 Atrioventricular block, complete: Secondary | ICD-10-CM | POA: Diagnosis not present

## 2020-06-02 DIAGNOSIS — I2510 Atherosclerotic heart disease of native coronary artery without angina pectoris: Secondary | ICD-10-CM | POA: Diagnosis not present

## 2020-06-02 DIAGNOSIS — I110 Hypertensive heart disease with heart failure: Secondary | ICD-10-CM | POA: Diagnosis not present

## 2020-06-06 DIAGNOSIS — I5042 Chronic combined systolic (congestive) and diastolic (congestive) heart failure: Secondary | ICD-10-CM | POA: Diagnosis not present

## 2020-06-06 DIAGNOSIS — I69351 Hemiplegia and hemiparesis following cerebral infarction affecting right dominant side: Secondary | ICD-10-CM | POA: Diagnosis not present

## 2020-06-06 DIAGNOSIS — I69320 Aphasia following cerebral infarction: Secondary | ICD-10-CM | POA: Diagnosis not present

## 2020-06-06 DIAGNOSIS — I110 Hypertensive heart disease with heart failure: Secondary | ICD-10-CM | POA: Diagnosis not present

## 2020-06-06 DIAGNOSIS — I2510 Atherosclerotic heart disease of native coronary artery without angina pectoris: Secondary | ICD-10-CM | POA: Diagnosis not present

## 2020-06-06 DIAGNOSIS — R1311 Dysphagia, oral phase: Secondary | ICD-10-CM | POA: Diagnosis not present

## 2020-06-06 DIAGNOSIS — I442 Atrioventricular block, complete: Secondary | ICD-10-CM | POA: Diagnosis not present

## 2020-06-06 DIAGNOSIS — I69391 Dysphagia following cerebral infarction: Secondary | ICD-10-CM | POA: Diagnosis not present

## 2020-06-06 DIAGNOSIS — Z48812 Encounter for surgical aftercare following surgery on the circulatory system: Secondary | ICD-10-CM | POA: Diagnosis not present

## 2020-06-07 DIAGNOSIS — I69320 Aphasia following cerebral infarction: Secondary | ICD-10-CM | POA: Diagnosis not present

## 2020-06-07 DIAGNOSIS — I110 Hypertensive heart disease with heart failure: Secondary | ICD-10-CM | POA: Diagnosis not present

## 2020-06-07 DIAGNOSIS — I69391 Dysphagia following cerebral infarction: Secondary | ICD-10-CM | POA: Diagnosis not present

## 2020-06-07 DIAGNOSIS — I25708 Atherosclerosis of coronary artery bypass graft(s), unspecified, with other forms of angina pectoris: Secondary | ICD-10-CM | POA: Diagnosis not present

## 2020-06-07 DIAGNOSIS — I442 Atrioventricular block, complete: Secondary | ICD-10-CM | POA: Diagnosis not present

## 2020-06-07 DIAGNOSIS — I2510 Atherosclerotic heart disease of native coronary artery without angina pectoris: Secondary | ICD-10-CM | POA: Diagnosis not present

## 2020-06-07 DIAGNOSIS — I5022 Chronic systolic (congestive) heart failure: Secondary | ICD-10-CM | POA: Diagnosis not present

## 2020-06-07 DIAGNOSIS — Z7901 Long term (current) use of anticoagulants: Secondary | ICD-10-CM | POA: Diagnosis not present

## 2020-06-07 DIAGNOSIS — I69351 Hemiplegia and hemiparesis following cerebral infarction affecting right dominant side: Secondary | ICD-10-CM | POA: Diagnosis not present

## 2020-06-07 DIAGNOSIS — Z950 Presence of cardiac pacemaker: Secondary | ICD-10-CM | POA: Diagnosis not present

## 2020-06-07 DIAGNOSIS — G40909 Epilepsy, unspecified, not intractable, without status epilepticus: Secondary | ICD-10-CM | POA: Diagnosis not present

## 2020-06-07 DIAGNOSIS — Z48812 Encounter for surgical aftercare following surgery on the circulatory system: Secondary | ICD-10-CM | POA: Diagnosis not present

## 2020-06-07 DIAGNOSIS — I639 Cerebral infarction, unspecified: Secondary | ICD-10-CM | POA: Diagnosis not present

## 2020-06-07 DIAGNOSIS — I5042 Chronic combined systolic (congestive) and diastolic (congestive) heart failure: Secondary | ICD-10-CM | POA: Diagnosis not present

## 2020-06-07 DIAGNOSIS — R1311 Dysphagia, oral phase: Secondary | ICD-10-CM | POA: Diagnosis not present

## 2020-06-08 ENCOUNTER — Other Ambulatory Visit: Payer: Self-pay

## 2020-06-08 ENCOUNTER — Ambulatory Visit: Payer: Medicare HMO | Admitting: Physician Assistant

## 2020-06-08 ENCOUNTER — Encounter: Payer: Self-pay | Admitting: Physician Assistant

## 2020-06-08 VITALS — BP 90/70 | HR 75 | Ht 66.0 in | Wt 118.0 lb

## 2020-06-08 DIAGNOSIS — Z8673 Personal history of transient ischemic attack (TIA), and cerebral infarction without residual deficits: Secondary | ICD-10-CM

## 2020-06-08 DIAGNOSIS — I5022 Chronic systolic (congestive) heart failure: Secondary | ICD-10-CM | POA: Diagnosis not present

## 2020-06-08 DIAGNOSIS — Z950 Presence of cardiac pacemaker: Secondary | ICD-10-CM

## 2020-06-08 DIAGNOSIS — I25810 Atherosclerosis of coronary artery bypass graft(s) without angina pectoris: Secondary | ICD-10-CM | POA: Diagnosis not present

## 2020-06-08 MED ORDER — SPIRONOLACTONE 25 MG PO TABS: 12.5000 mg | ORAL_TABLET | Freq: Every day | ORAL | 3 refills | Status: DC

## 2020-06-08 NOTE — Progress Notes (Signed)
Cardiology Office Note:    Date:  06/10/2020   ID:  [name], [name] 09-26-1950, MRN [id number]  PCP:  Just, [name], FNP (Inactive)   [facility] Providers Cardiologist:  Candee Furbish, MD {  Referring MD: Just, [name], FNP   No chief complaint on file.   History of Present Illness:    [name] is a 70 y.o. female with a hx of CVA, chronic systolic heart failure, CHB and CAD.  Patient had a stroke in 2009 which was thought to be cardioembolic.  She developed a right hemiparesis and aphasia at the time.  She was hospitalized in August 2020 with a seizure after being out of medication for 4 days.  She required intubation during this hospitalization.  Troponin was elevated.  Echocardiogram obtained in August 2020 revealed EF 35 to 40%, global hypokinesis, moderate LVH, elevated LVEDP, moderate AI.  Myoview in August 2020 showed a large severe fixed inferior and anterior apical and inferolateral perfusion defect suggestive of scar without ischemia, the study was high risk due to low EF.  She was deemed not a good candidate for cardiac catheterization at that time.  She was admitted in March 2022 with a syncopal episode and found to be in complete heart block.  This initially improved after stopping the beta-blocker.  Echocardiogram obtained in March 2022 showed EF 35 to 40%.  Left heart cath showed significant left main and left circumflex disease.  She was deemed a poor candidate for PCI and underwent CABG with LIMA to LAD, SVG to OM 2 during the hospitalization.  Postop, she developed complete heart block again and a Saint Jude CRT-P was placed.  Since discharge, she was seen by Dr. Aundra Dubin 05/13/2020, she was found Toprol-XL, Entresto, spironolactone and 20 mg daily of Lasix.  Basic metabolic panel was repeated.  Since last visit, she has switched cardiologist to Dr. Marlou Porch.  Patient presents today for cardiology follow-up.  Initially her blood pressure was documented as  90/60, however on manual recheck by myself, blood pressure was actually 106/58.  She denies any significant shortness of breath or dizziness.  She still have a soreness in her sternotomy scar.  Sternotomy scar seems to be very well-healed without any drainage or redness.  Her pacemaker site is also very well-healed as well.  Overall, patient seems to be doing well after her bypass surgery and a pacemaker implantation.  She can follow-up with Dr. Marlou Porch in 3 months.  She has scheduled follow-up with our heart failure service and also our EP service in the next few months.   Past Medical History:  Diagnosis Date  . Adenomatous polyp of colon 09/2012   repeat colonoscopy in 5 years by Dr. Sharlett Iles  . CHF (congestive heart failure) (HCC)    EF 15-20% as of 05/28/11 (Dr Legrand [facility] D/C summary)  . CVA (cerebral infarction) 12/2007  . Hemiparesis ([place])   . Hyperlipidemia   . Hypertension   . Seizures ([place])   . Sickle cell anemia (HCC)   . Stroke ([place])   . Vitamin D deficiency 2010    Past Surgical History:  Procedure Laterality Date  . ABDOMINAL HYSTERECTOMY    . BIV PACEMAKER INSERTION CRT-P N/A 04/29/2020   Procedure: BIV PACEMAKER INSERTION CRT-P;  Surgeon: Constance Haw, MD;  Location: [facility];  Service: Cardiovascular;  Laterality: N/A;  . CORONARY ARTERY BYPASS GRAFT N/A 04/25/2020   Procedure: CORONARY ARTERY BYPASS GRAFTING (CABG) TIMES TWO USING LEFT  INTERNAL MAMMARY ARTERY AND RIGHT GREATER SAPHENOUS VEIN HARVESTED ENDOSCOPICALLY;  Surgeon: Melrose Nakayama, MD;  Location: [facility];  Service: Open Heart Surgery;  Laterality: N/A;  . FRACTURE SURGERY    . HIP SURGERY    . LEFT HEART CATH AND CORONARY ANGIOGRAPHY N/A 04/22/2020   Procedure: LEFT HEART CATH AND CORONARY ANGIOGRAPHY;  Surgeon: Larey Dresser, MD;  Location: [facility];  Service: Cardiovascular;  Laterality: N/A;  . TEE WITHOUT CARDIOVERSION  04/25/2020   Procedure: TRANSESOPHAGEAL  ECHOCARDIOGRAM (TEE);  Surgeon: Melrose Nakayama, MD;  [facility];  Service: Open Heart Surgery;;  . TEMPORARY PACEMAKER N/A 04/21/2020   Procedure: TEMPORARY PACEMAKER;  Surgeon: Larey Dresser, MD;  Location: [place];  Service: Cardiovascular;  Laterality: N/A;  . TUBAL LIGATION      Current Medications: Current Meds  Medication Sig  . acetaminophen (TYLENOL) 325 MG tablet Take 650 mg by mouth every 4 (four) hours as needed.  Marland Kitchen aspirin 81 MG chewable tablet Chew 1 tablet (81 mg total) by mouth daily.  Marland Kitchen atorvastatin (LIPITOR) 80 MG tablet TAKE 1 TABLET AT BEDTIME  . baclofen (LIORESAL) 20 MG tablet Take 20 mg by mouth 3 (three) times daily. For Muscle Spasms And at Bedtime as needed for muscle spasms  . bisacodyl (DULCOLAX) 10 MG suppository Place 10 mg rectally as needed for moderate constipation.  . Cholecalciferol 50 MCG (2000 UT) TABS Take 1 tablet by mouth daily in the afternoon.  . furosemide (LASIX) 20 MG tablet Take 20 mg by mouth.  . Lacosamide 100 MG TABS Take 1 tablet by mouth 2 (two) times daily.  Marland Kitchen levETIRAcetam (KEPPRA) 750 MG tablet Take 2 tablets (1,500 mg total) by mouth 2 (two) times daily.  . metoprolol succinate (TOPROL XL) 25 MG 24 hr tablet Take 1 tablet (25 mg total) by mouth daily.  . polyethylene glycol (MIRALAX / GLYCOLAX) 17 g packet Take 17 g by mouth daily as needed.  . sacubitril-valsartan (ENTRESTO) 24-26 MG Take 1 tablet by mouth 2 (two) times daily.  . Sodium Phosphates (RA SALINE ENEMA RE) Place rectally as needed.  . warfarin (COUMADIN) 4 MG tablet Take 4 mg by mouth every evening.  . [DISCONTINUED] baclofen (LIORESAL) 20 MG tablet Take 20 mg by mouth as needed for muscle spasms.  . [DISCONTINUED] Colchicine 0.6 MG CAPS Take 1 capsule by mouth daily in the afternoon. For GOUT  . [DISCONTINUED] Magnesium Hydroxide (MILK OF MAGNESIA PO) Take 30 mLs by mouth as needed.  . [DISCONTINUED] metoprolol succinate (TOPROL-XL) 25 MG 24 hr  tablet Take 1 tablet (25 mg total) by mouth at bedtime.  . [DISCONTINUED] spironolactone (ALDACTONE) 25 MG tablet TAKE 1/2 TABLET EVERY DAY  . [DISCONTINUED] traMADol (ULTRAM) 50 MG tablet Take 50 mg by mouth every 6 (six) hours as needed.     Allergies:   Lexiscan [regadenoson]   Social History   Socioeconomic History  . Marital status: Married    Spouse name: [name]  . Number of children: 2  . Years of education: 47  . Highest education level: Not on file  Occupational History    Employer: RETIRED  Tobacco Use  . Smoking status: Former Smoker    Types: Cigarettes    Quit date: 09/26/2007    Years since quitting: 12.7  . Smokeless tobacco: Never Used  Substance and Sexual Activity  . Alcohol use: No  . Drug use: No  . Sexual activity: Not Currently  Other  Topics Concern  . Not on file  Social History Narrative   Lives with husband   Social Determinants of Health   Financial Resource Strain: Not on file  Food Insecurity: Not on file  Transportation Needs: Not on file  Physical Activity: Not on file  Stress: Not on file  Social Connections: Not on file     Family History: The patient's family history includes Colon cancer in her maternal grandfather; Diabetes in her daughter.  ROS:   Please see the history of present illness.     All other systems reviewed and are negative.  EKGs/Labs/Other Studies Reviewed:    The following studies were reviewed today:  Echo 04/29/2020 1. Left ventricular ejection fraction, by estimation, is 30 to 35%. The  left ventricle has moderately decreased function. The left ventricle  demonstrates regional wall motion abnormalities (see scoring  diagram/findings for description). Inferior/lateral  akinesis. Left ventricular diastolic parameters are indeterminate.  2. Right ventricular systolic function is severely reduced. The right  ventricular size is normal. There is normal pulmonary artery systolic  pressure.  3. The mitral  valve is normal in structure. Trivial mitral valve  regurgitation.  4. The aortic valve is tricuspid. Aortic valve regurgitation is moderate.  Mild aortic valve sclerosis is present, with no evidence of aortic valve  stenosis.   EKG:  EKG is not ordered today.    Recent Labs: 04/21/2020: ALT 15 04/26/2020: Magnesium 2.2 05/12/2020: Hemoglobin 10.6; Platelets 643 05/13/2020: BUN 19; Creatinine, Ser 0.78; Potassium 3.9; Sodium 137  Recent Lipid Panel    Component Value Date/Time   CHOL 124 04/22/2020 0426   CHOL 120 10/08/2019 1031   TRIG 94 04/22/2020 0426   HDL 41 04/22/2020 0426   HDL 39 (L) 10/08/2019 1031   CHOLHDL 3.0 04/22/2020 0426   VLDL 19 04/22/2020 0426   LDLCALC 64 04/22/2020 0426   LDLCALC 61 10/08/2019 1031     Risk Assessment/Calculations:       Physical Exam:    VS:  BP 90/70 (BP Location: Left Arm, Patient Position: Sitting)   Pulse 75   Ht 5\' 6"  (1.676 m)   Wt 118 lb (53.5 kg)   SpO2 99%   BMI 19.05 kg/m     Wt Readings from Last 3 Encounters:  06/08/20 118 lb (53.5 kg)  05/31/20 118 lb (53.5 kg)  05/26/20 114 lb 3.2 oz (51.8 kg)     GEN:  Well nourished, well developed in no acute distress HEENT: Normal NECK: No JVD; No carotid bruits LYMPHATICS: No lymphadenopathy CARDIAC: RRR, no murmurs, rubs, gallops.  Sternotomy scar well-healed. RESPIRATORY:  Clear to auscultation without rales, wheezing or rhonchi  ABDOMEN: Soft, non-tender, non-distended MUSCULOSKELETAL:  No edema; No deformity  SKIN: Warm and dry NEUROLOGIC:  Alert and oriented x 3 PSYCHIATRIC:  Normal affect   ASSESSMENT:    1. Coronary artery disease involving coronary bypass graft of native heart without angina pectoris   2. Chronic systolic heart failure ([place])   3. Presence of cardiac resynchronization therapy pacemaker (CRT-P)   4. H/O: CVA (cerebrovascular accident)    PLAN:    In order of problems listed above:  1. CAD s/p CABG: Recently underwent CABG.  Denies any  recent chest discomfort.  Chest cardiotomy scar is well-healed.  Continue aspirin and Coumadin  2. Chronic systolic heart failure: Euvolemic on exam.  Continue Lasix 20 mg daily.  On metoprolol succinate and Entresto along with spironolactone  3. Complete heart block s/p CRT-P: Followed  by EP service  4. History of CVA: No recurrence.  On Coumadin therapy  5. Hyperlipidemia: Continue Lipitor.         Medication Adjustments/Labs and Tests Ordered: Current medicines are reviewed at length with the patient today.  Concerns regarding medicines are outlined above.  No orders of the defined types were placed in this encounter.  Meds ordered this encounter  Medications  . spironolactone (ALDACTONE) 25 MG tablet    Sig: Take 0.5 tablets (12.5 mg total) by mouth daily.    Dispense:  45 tablet    Refill:  3    Patient Instructions  Medication Instructions:  Your physician recommends that you continue on your current medications as directed. Please refer to the Current Medication list given to you today.  *If you need a refill on your cardiac medications before your next appointment, please call your pharmacy*  Lab Work: NONE ordered at this time of appointment   If you have labs (blood work) drawn today and your tests are completely normal, you will receive your results only by: Marland Kitchen MyChart Message (if you have MyChart) OR . A paper copy in the mail If you have any lab test that is abnormal or we need to change your treatment, we will call you to review the results.  Testing/Procedures: NONE ordered at this time of appointment   Follow-Up: At [facility], you and your health needs are our priority.  As part of our continuing mission to provide you with exceptional heart care, we have created designated Provider Care Teams.  These Care Teams include your primary Cardiologist (physician) and Advanced Practice Providers (APPs -  Physician Assistants and Nurse Practitioners) who all  work together to provide you with the care you need, when you need it.  Your next appointment:   3 month(s)  The format for your next appointment:   In Person  Provider:   Candee Furbish, MD  Other Instructions      Signed, Almyra Deforest, [place]  06/10/2020 2:34 PM    [place]

## 2020-06-08 NOTE — Patient Instructions (Signed)
Medication Instructions:  Your physician recommends that you continue on your current medications as directed. Please refer to the Current Medication list given to you today.  *If you need a refill on your cardiac medications before your next appointment, please call your pharmacy*  Lab Work: NONE ordered at this time of appointment   If you have labs (blood work) drawn today and your tests are completely normal, you will receive your results only by: Marland Kitchen MyChart Message (if you have MyChart) OR . A paper copy in the mail If you have any lab test that is abnormal or we need to change your treatment, we will call you to review the results.  Testing/Procedures: NONE ordered at this time of appointment   Follow-Up: At [facility], you and your health needs are our priority.  As part of our continuing mission to provide you with exceptional heart care, we have created designated Provider Care Teams.  These Care Teams include your primary Cardiologist (physician) and Advanced Practice Providers (APPs -  Physician Assistants and Nurse Practitioners) who all work together to provide you with the care you need, when you need it.  Your next appointment:   3 month(s)  The format for your next appointment:   In Person  Provider:   Candee Furbish, MD  Other Instructions

## 2020-06-10 DIAGNOSIS — R1311 Dysphagia, oral phase: Secondary | ICD-10-CM | POA: Diagnosis not present

## 2020-06-10 DIAGNOSIS — Z48812 Encounter for surgical aftercare following surgery on the circulatory system: Secondary | ICD-10-CM | POA: Diagnosis not present

## 2020-06-10 DIAGNOSIS — I69320 Aphasia following cerebral infarction: Secondary | ICD-10-CM | POA: Diagnosis not present

## 2020-06-10 DIAGNOSIS — I5042 Chronic combined systolic (congestive) and diastolic (congestive) heart failure: Secondary | ICD-10-CM | POA: Diagnosis not present

## 2020-06-10 DIAGNOSIS — I110 Hypertensive heart disease with heart failure: Secondary | ICD-10-CM | POA: Diagnosis not present

## 2020-06-10 DIAGNOSIS — I69391 Dysphagia following cerebral infarction: Secondary | ICD-10-CM | POA: Diagnosis not present

## 2020-06-10 DIAGNOSIS — I2510 Atherosclerotic heart disease of native coronary artery without angina pectoris: Secondary | ICD-10-CM | POA: Diagnosis not present

## 2020-06-10 DIAGNOSIS — I442 Atrioventricular block, complete: Secondary | ICD-10-CM | POA: Diagnosis not present

## 2020-06-10 DIAGNOSIS — I69351 Hemiplegia and hemiparesis following cerebral infarction affecting right dominant side: Secondary | ICD-10-CM | POA: Diagnosis not present

## 2020-06-14 ENCOUNTER — Encounter (HOSPITAL_COMMUNITY): Payer: Medicare HMO

## 2020-06-15 ENCOUNTER — Encounter (HOSPITAL_COMMUNITY): Payer: Self-pay

## 2020-06-15 ENCOUNTER — Other Ambulatory Visit: Payer: Self-pay

## 2020-06-15 ENCOUNTER — Ambulatory Visit (HOSPITAL_COMMUNITY)
Admission: RE | Admit: 2020-06-15 | Discharge: 2020-06-15 | Disposition: A | Discharge status: Home / Self Care | Payer: Medicare HMO | Source: Ambulatory Visit | Attending: Cardiology | Admitting: Cardiology

## 2020-06-15 VITALS — BP 122/55 | HR 75 | Wt 118.0 lb

## 2020-06-15 DIAGNOSIS — I5042 Chronic combined systolic (congestive) and diastolic (congestive) heart failure: Secondary | ICD-10-CM | POA: Insufficient documentation

## 2020-06-15 DIAGNOSIS — Z951 Presence of aortocoronary bypass graft: Secondary | ICD-10-CM | POA: Diagnosis not present

## 2020-06-15 DIAGNOSIS — I442 Atrioventricular block, complete: Secondary | ICD-10-CM

## 2020-06-15 DIAGNOSIS — Z7901 Long term (current) use of anticoagulants: Secondary | ICD-10-CM | POA: Insufficient documentation

## 2020-06-15 DIAGNOSIS — Z7982 Long term (current) use of aspirin: Secondary | ICD-10-CM | POA: Insufficient documentation

## 2020-06-15 DIAGNOSIS — I255 Ischemic cardiomyopathy: Secondary | ICD-10-CM | POA: Insufficient documentation

## 2020-06-15 DIAGNOSIS — Z79899 Other long term (current) drug therapy: Secondary | ICD-10-CM | POA: Insufficient documentation

## 2020-06-15 DIAGNOSIS — R0600 Dyspnea, unspecified: Secondary | ICD-10-CM | POA: Insufficient documentation

## 2020-06-15 DIAGNOSIS — Z87891 Personal history of nicotine dependence: Secondary | ICD-10-CM | POA: Diagnosis not present

## 2020-06-15 DIAGNOSIS — Z8673 Personal history of transient ischemic attack (TIA), and cerebral infarction without residual deficits: Secondary | ICD-10-CM | POA: Insufficient documentation

## 2020-06-15 DIAGNOSIS — G40909 Epilepsy, unspecified, not intractable, without status epilepticus: Secondary | ICD-10-CM | POA: Insufficient documentation

## 2020-06-15 DIAGNOSIS — I2510 Atherosclerotic heart disease of native coronary artery without angina pectoris: Secondary | ICD-10-CM | POA: Diagnosis not present

## 2020-06-15 LAB — BASIC METABOLIC PANEL
Anion gap: 8 (ref 5–15)
BUN: 10 mg/dL (ref 8–23)
CO2: 25 mmol/L (ref 22–32)
Calcium: 8.8 mg/dL — ABNORMAL LOW (ref 8.9–10.3)
Chloride: 105 mmol/L (ref 98–111)
Creatinine, Ser: 0.87 mg/dL (ref 0.44–1.00)
GFR, Estimated: >60 mL/min (ref >60)
Glucose, Bld: 83 mg/dL (ref 70–99)
Potassium: 4 mmol/L (ref 3.5–5.1)
Sodium: 138 mmol/L (ref 135–145)

## 2020-06-15 MED ORDER — FUROSEMIDE 20 MG PO TABS: 20.0000 mg | ORAL_TABLET | ORAL | 5 refills | Status: DC | PRN

## 2020-06-15 MED ORDER — SACUBITRIL-VALSARTAN 49-51 MG PO TABS: 1.0000 | ORAL_TABLET | Freq: Two times a day (BID) | ORAL | 3 refills | Status: DC

## 2020-06-15 NOTE — Progress Notes (Signed)
ReDS Vest / Clip - 06/15/20 0841      ReDS Vest / Clip   Station Marker C    Ruler Value 24    ReDS Value Range Low volume    ReDS Actual Value 23    Anatomical Comments sitting

## 2020-06-15 NOTE — Patient Instructions (Signed)
CHANGE Furosemide (Lasix) to 20mg  daily AS NEEDED for weight gain or swelling.   INCREASE Entresto to 49/51mg  (1 tab) twice a day   Labs today and repeat in 1 week We will only contact you if something comes back abnormal or we need to make some changes. Otherwise no news is good news!   Your physician recommends that you schedule a follow-up appointment in: 4 weeks with the Pharmacy and 8 weeks with the Nurse Practitioner or Physician Assistant.    Please call office at [phone] option [phone] if you have any questions or concerns.    At the [facility], you and your health needs are our priority. As part of our continuing mission to provide you with exceptional heart care, we have created designated Provider Care Teams. These Care Teams include your primary Cardiologist (physician) and Advanced Practice Providers (APPs- Physician Assistants and Nurse Practitioners) who all work together to provide you with the care you need, when you need it.   You may see any of the following providers on your designated Care Team at your next follow up: Marland Kitchen Dr Glori Bickers . Dr Loralie Champagne . Dr Vickki Muff . Darrick Grinder, NP . Lyda Jester, [place] . Audry Riles, PharmD   Please be sure to bring in all your medications bottles to every appointment.

## 2020-06-15 NOTE — Progress Notes (Signed)
Advanced Heart Failure Clinic Progress Note    PCP: Just, [name], FNP (Inactive) HF Cardiology: Dr. Aundra Dubin   70 y.o. with history of CVA, chronic systolic CHF, CHB, and CAD returns for followup of CHF.   She had a stroke in 2009, thought to be cardioembolic.  She has had right hemiparesis and aphasia since that time.   [name] was hospitalized in August 2020 with a seizure after being out of her medications for 4 days.  She required intubation.  High-sensitivity troponin trended to 1905. An echocardiogram 08/2018 revealed EF of 35 to 40% with global hypokinesis and moderate LVH, elevated LVEDP.  She also had moderate AR. Nuclear stress test in August 2020 showed a large severe fixed inferior, anterior apical, and inferolateral perfusion defect suggestive of scar without ischemia.  Study was high risk due to EF estimated at 34% but mentions no significant reversible ischemia. She was deemed not a good candidate for cardiac catheterization during that hospitalization. She has followed with Dr. Oval [name] and was last seen in November 2021.   Patient was admitted in 3/22 following a syncopal episode and was found to be in complete heart block.  This improved initially after stopping nodal blockers.  Echo in 3/22 showed EF 35-40%.  LHC showed significant left main and LCx disease.  She was deemed to be a poor candidate for PCI.  In 4/22, she had CABG with LIMA-LAD and SVG-OM2.  Post-op, she developed CHB again.  St Jude CRT-P device was placed. She was discharged to a rehab facility.   Had post hospital f/u on 4/22. At the time, volume status was stable and w/ NYHA class II symptoms. Toprol was increased to 25 mg daily. No other med changes. Instructed to f/u in 4 weeks.   Returns today for f/u. Here w/ her husband. In wheelchair. Was released from SNF 2 weeks ago. Back at home and getting home health PT but husband reports she has not been very cooperative w/ PT. Still primarily wheelchair  dependent. Able to make short transfers w/ walker, but unable to stand/walk for long periods of time. Has slight dyspnea w/ transfers. Unsure of wt gain, pt unable to stand for wt today (charted wt was self reported). No CP. BP stable, 122/55. Denies dizziness. appeitite has been good.     ECG: not performed today   Labs (4/22): K 3.9, creatinine 0.83, hgb 9.4. digoxin level <0.2   PMH: 1. H/o seizure disorder.  2. CVA: 2009 with aphasia and right hemiparesis.  3. Complete heart block: St Jude CRT-P. 4. Chronic systolic CHF: Ischemic cardiomyopathy. St Jude CRT-P device.  - Echo (3/22): EF 35-40%, inferior and lateral HK, normal RV.  - Coronary angiography (4/22): 60-70% dLM, 60-70% ostial LCx, 99% proximal calcified LCx (LCx was large, dominant vessel).  5. CAD:  Coronary angiography (4/22) with 60-70% dLM, 60-70% ostial LCx, 99% proximal calcified LCx (LCx was large, dominant vessel). - CABG x 4 in 4/22: LIMA-LAD, SVG-OM2.  6. CHB: St Jude CRT-P placed in 4/22.   Social History   Socioeconomic History  . Marital status: Married    Spouse name: [name]  . Number of children: 2  . Years of education: 69  . Highest education level: Not on file  Occupational History    Employer: RETIRED  Tobacco Use  . Smoking status: Former Smoker    Types: Cigarettes    Quit date: 09/26/2007    Years since quitting: 12.7  . Smokeless  tobacco: Never Used  Substance and Sexual Activity  . Alcohol use: No  . Drug use: No  . Sexual activity: Not Currently  Other Topics Concern  . Not on file  Social History Narrative   Lives with husband   Social Determinants of Health   Financial Resource Strain: Not on file  Food Insecurity: Not on file  Transportation Needs: Not on file  Physical Activity: Not on file  Stress: Not on file  Social Connections: Not on file  Intimate Partner Violence: Not on file   Family History  Problem Relation Age of Onset  . Diabetes Daughter   . Colon cancer  Maternal Grandfather    ROS: All systems reviewed and negative except as per HPI.   Current Outpatient Medications  Medication Sig Dispense Refill  . acetaminophen (TYLENOL) 325 MG tablet Take 650 mg by mouth every 4 (four) hours as needed.    Marland Kitchen aspirin 81 MG chewable tablet Chew 1 tablet (81 mg total) by mouth daily. 30 tablet 6  . atorvastatin (LIPITOR) 80 MG tablet TAKE 1 TABLET AT BEDTIME 90 tablet 2  . baclofen (LIORESAL) 20 MG tablet Take 20 mg by mouth 3 (three) times daily. For Muscle Spasms And at Bedtime as needed for muscle spasms    . Cholecalciferol 50 MCG (2000 UT) TABS Take 1 tablet by mouth daily in the afternoon.    . furosemide (LASIX) 20 MG tablet Take 20 mg by mouth.    . Lacosamide 100 MG TABS Take 1 tablet by mouth 2 (two) times daily.    Marland Kitchen levETIRAcetam (KEPPRA) 750 MG tablet Take 2 tablets (1,500 mg total) by mouth 2 (two) times daily. 360 tablet 4  . metoprolol succinate (TOPROL XL) 25 MG 24 hr tablet Take 1 tablet (25 mg total) by mouth daily. 30 tablet 6  . sacubitril-valsartan (ENTRESTO) 24-26 MG Take 1 tablet by mouth 2 (two) times daily. 180 tablet 3  . spironolactone (ALDACTONE) 25 MG tablet Take 0.5 tablets (12.5 mg total) by mouth daily. 45 tablet 3  . warfarin (COUMADIN) 4 MG tablet Take 4 mg by mouth every evening.     No current facility-administered medications for this encounter.   BP (!) 122/55   Pulse 75   Wt 53.5 kg (118 lb) Comment: pt reported, unable to stand  SpO2 100%   BMI 19.05 kg/m    PHYSICAL EXAM: General:  Chronically ill appearing, in wheelchair. No respiratory difficulty HEENT: normal Neck: supple. no JVD. Carotids 2+ bilat; no bruits. No lymphadenopathy or thyromegaly appreciated. Cor: PMI nondisplaced. Regular rate & rhythm. No rubs, gallops or murmurs. Lungs: clear Abdomen: soft, nontender, nondistended. No hepatosplenomegaly. No bruits or masses. Good bowel sounds. Extremities: thin extremities, no cyanosis, clubbing, rash,  edema Neuro: alert. Aphasic, + Rt hemiparesis. Affect pleasant.   Asssessment/Plan: 1. Complete heart block: Patient had baseline NSR with LAFB/RBBB, so there was underlying conduction system disease.  She was transiently in CHB pre-CABG and developed persistent CHB post-CABG.  She now has [employer] CRT-P device.   2. Chronic systolic CHF: Ischemic cardiomyopathy.  Echo in 8/20 with EF 35-40% and wall motion abnormalities. LHC in 4/22 showed left main/severe codominant LCx disease.  Echo in 3/22 with EF 35-40%, inferior and lateral hypokinesis. Now s/p CABG. Unable to assess functional class as pt is primarily wheelchair dependent. Denies resting dyspnea. Euvolemic on exam. ReDs Clip 23%.  - Increase Entresto to 49-51 mg bid - Change Lasix to PRN    -  Continue spironolactone 12.5 mg daily.  - Continue Toprol XL 25 mg daily.  - BMP today and again in 7 days  3. 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 Has been on warfarin. Has had long-standing aphasia and right hemiparesis.   - Continue warfarin.    - Encouraged to improve participation w/ home PT  4. H/o seizure disorder: Continue Keppra.  5. CAD:  LHC showed 60-70% distal left main stenosis, heavily calcified proximal LCx with 60-70% stenosis at the ostium and discrete 99% stenosis in the proximal LCx. I suspect that her CHB is related to chronic ischemia from LM-LCx disease worsened by nodal blockade. Had CABG x 2 4/22 w/ LIMA-LAD, SVG-OM2.  - Stable w/o CP  - Continue ASA 81 daily.  - Continue statin.   F/u in 4 weeks w/ PharmD. F/u w/ APP in 8 weeks   Lyda Jester, PA-C  06/15/2020

## 2020-06-16 DIAGNOSIS — I2510 Atherosclerotic heart disease of native coronary artery without angina pectoris: Secondary | ICD-10-CM | POA: Diagnosis not present

## 2020-06-16 DIAGNOSIS — I5042 Chronic combined systolic (congestive) and diastolic (congestive) heart failure: Secondary | ICD-10-CM | POA: Diagnosis not present

## 2020-06-16 DIAGNOSIS — I69351 Hemiplegia and hemiparesis following cerebral infarction affecting right dominant side: Secondary | ICD-10-CM | POA: Diagnosis not present

## 2020-06-16 DIAGNOSIS — I110 Hypertensive heart disease with heart failure: Secondary | ICD-10-CM | POA: Diagnosis not present

## 2020-06-16 DIAGNOSIS — Z48812 Encounter for surgical aftercare following surgery on the circulatory system: Secondary | ICD-10-CM | POA: Diagnosis not present

## 2020-06-16 DIAGNOSIS — I69391 Dysphagia following cerebral infarction: Secondary | ICD-10-CM | POA: Diagnosis not present

## 2020-06-16 DIAGNOSIS — I442 Atrioventricular block, complete: Secondary | ICD-10-CM | POA: Diagnosis not present

## 2020-06-16 DIAGNOSIS — R1311 Dysphagia, oral phase: Secondary | ICD-10-CM | POA: Diagnosis not present

## 2020-06-16 DIAGNOSIS — I69320 Aphasia following cerebral infarction: Secondary | ICD-10-CM | POA: Diagnosis not present

## 2020-06-17 DIAGNOSIS — I69391 Dysphagia following cerebral infarction: Secondary | ICD-10-CM | POA: Diagnosis not present

## 2020-06-17 DIAGNOSIS — R1311 Dysphagia, oral phase: Secondary | ICD-10-CM | POA: Diagnosis not present

## 2020-06-17 DIAGNOSIS — I69320 Aphasia following cerebral infarction: Secondary | ICD-10-CM | POA: Diagnosis not present

## 2020-06-17 DIAGNOSIS — Z48812 Encounter for surgical aftercare following surgery on the circulatory system: Secondary | ICD-10-CM | POA: Diagnosis not present

## 2020-06-17 DIAGNOSIS — I442 Atrioventricular block, complete: Secondary | ICD-10-CM | POA: Diagnosis not present

## 2020-06-17 DIAGNOSIS — I5042 Chronic combined systolic (congestive) and diastolic (congestive) heart failure: Secondary | ICD-10-CM | POA: Diagnosis not present

## 2020-06-17 DIAGNOSIS — I110 Hypertensive heart disease with heart failure: Secondary | ICD-10-CM | POA: Diagnosis not present

## 2020-06-17 DIAGNOSIS — I69351 Hemiplegia and hemiparesis following cerebral infarction affecting right dominant side: Secondary | ICD-10-CM | POA: Diagnosis not present

## 2020-06-17 DIAGNOSIS — I2510 Atherosclerotic heart disease of native coronary artery without angina pectoris: Secondary | ICD-10-CM | POA: Diagnosis not present

## 2020-06-22 ENCOUNTER — Encounter: Payer: Self-pay | Admitting: Family Medicine

## 2020-06-22 ENCOUNTER — Ambulatory Visit (INDEPENDENT_AMBULATORY_CARE_PROVIDER_SITE_OTHER): Payer: Medicare HMO | Admitting: Family Medicine

## 2020-06-22 ENCOUNTER — Other Ambulatory Visit: Payer: Self-pay

## 2020-06-22 VITALS — BP 116/68 | HR 74 | Temp 97.5°F | Ht 66.0 in

## 2020-06-22 DIAGNOSIS — E782 Mixed hyperlipidemia: Secondary | ICD-10-CM

## 2020-06-22 DIAGNOSIS — I10 Essential (primary) hypertension: Secondary | ICD-10-CM | POA: Diagnosis not present

## 2020-06-22 DIAGNOSIS — I2510 Atherosclerotic heart disease of native coronary artery without angina pectoris: Secondary | ICD-10-CM

## 2020-06-22 DIAGNOSIS — I5042 Chronic combined systolic (congestive) and diastolic (congestive) heart failure: Secondary | ICD-10-CM

## 2020-06-22 DIAGNOSIS — I442 Atrioventricular block, complete: Secondary | ICD-10-CM | POA: Diagnosis not present

## 2020-06-22 DIAGNOSIS — G40909 Epilepsy, unspecified, not intractable, without status epilepticus: Secondary | ICD-10-CM

## 2020-06-22 DIAGNOSIS — Z8673 Personal history of transient ischemic attack (TIA), and cerebral infarction without residual deficits: Secondary | ICD-10-CM

## 2020-06-22 DIAGNOSIS — Z7901 Long term (current) use of anticoagulants: Secondary | ICD-10-CM | POA: Diagnosis not present

## 2020-06-22 NOTE — Progress Notes (Signed)
Provider:  Alain Honey, MD  Careteam: Patient Care Team: [name], MD as PCP - General (Family Medicine) Jerline Pain, MD as PCP - Cardiology (Cardiology) Adrian Prows, MD as Attending Physician (Cardiology) Burnard Bunting, MD (Internal Medicine) Skeet Latch, MD as Attending Physician (Cardiology) Leta Baptist, Earlean Polka, MD as Consulting Physician (Neurology)  PLACE OF SERVICE:  [place]  Advanced Directive information    Allergies  Allergen Reactions  . Lexiscan [Regadenoson] Other (See Comments)    Perioral tingling and throat tightness    Chief Complaint  Patient presents with  . New Patient (Initial Visit)    Patient presents today for a new patient appointment. She has a history or diagnosis of HTN, HLD and CAD.  . Quality Metric Gaps    Patient is due for a shingrix vaccine per care gap.     HPI: Patient is a 70 y.o. female this is the first [place] care visit for this 70 year old African-American female.  She was hospitalized 3 31-4 14 with complete heart block.  She underwent cardiac cath which showed 71st percent blockage and ejection fraction of 35% as well as aortic valve sclerosis without significant stenosis coronary artery bypass grafting x2 was performed.  Bivalve pacemaker was placed on 4- 8. She was admitted to [facility] skilled nursing facility and stayed there about 2 weeks.  Progress there was slow.  She was discharged home to continue with PT and OT.  Her husband is primary caretaker.  Since discharge from [facility] she has seen cardiology as well as CVTS and follow-up.  She is getting therapy about once a week per her husband.  She is aphasic secondary to stroke.  Review of Systems:  Review of Systems  Constitutional: Negative.   Respiratory: Negative.   Cardiovascular: Negative.   Gastrointestinal: Negative.   Neurological: Positive for focal weakness and seizures.  All other systems reviewed and are negative.   Past  Medical History:  Diagnosis Date  . Adenomatous polyp of colon 09/2012   repeat colonoscopy in 5 years by Dr. Sharlett Iles  . CHF (congestive heart failure) (HCC)    EF 15-20% as of 05/28/11 (Dr Legrand [facility] D[facility] summary)  . CVA (cerebral infarction) 12/2007  . H/O heart bypass surgery 2022  . Hemiparesis ([place])   . Hyperlipidemia   . Hypertension   . Seizures ([place])   . Sickle cell anemia (HCC)   . Stroke ([place])   . Vitamin D deficiency 2010   Past Surgical History:  Procedure Laterality Date  . ABDOMINAL HYSTERECTOMY    . BIV PACEMAKER INSERTION CRT-P N/A 04/29/2020   Procedure: BIV PACEMAKER INSERTION CRT-P;  Surgeon: Constance Haw, MD;  Location: [place] CV LAB;  Service: Cardiovascular;  Laterality: N/A;  . CORONARY ARTERY BYPASS GRAFT N/A 04/25/2020   Procedure: CORONARY ARTERY BYPASS GRAFTING (CABG) TIMES TWO USING LEFT INTERNAL MAMMARY ARTERY AND RIGHT GREATER SAPHENOUS VEIN HARVESTED ENDOSCOPICALLY;  Surgeon: Melrose Nakayama, MD;  Location: [place];  Service: Open Heart Surgery;  Laterality: N/A;  . FRACTURE SURGERY    . HIP SURGERY    . LEFT HEART CATH AND CORONARY ANGIOGRAPHY N/A 04/22/2020   Procedure: LEFT HEART CATH AND CORONARY ANGIOGRAPHY;  Surgeon: Larey Dresser, MD;  Location: [place] CV LAB;  Service: Cardiovascular;  Laterality: N/A;  . TEE WITHOUT CARDIOVERSION  04/25/2020   Procedure: TRANSESOPHAGEAL ECHOCARDIOGRAM (TEE);  Surgeon: Melrose Nakayama, MD;  Location: [place];  Service: Open Heart Surgery;;  .  TEMPORARY PACEMAKER N/A 04/21/2020   Procedure: TEMPORARY PACEMAKER;  Surgeon: Larey Dresser, MD;  Location: [place] CV LAB;  Service: Cardiovascular;  Laterality: N/A;  . TUBAL LIGATION     Social History:   reports that she quit smoking about 12 years ago. Her smoking use included cigarettes. She has never used smokeless tobacco. She reports previous alcohol use. She reports that she does not use drugs.  Family History  Problem  Relation Age of Onset  . Diabetes Daughter   . High Cholesterol Daughter   . Hypertension Daughter   . Heart disease Daughter   . Hypertension Sister   . High Cholesterol Sister   . Asthma Brother   . Bronchitis Brother   . Colon cancer Maternal Grandfather     Medications: Patient's Medications  New Prescriptions   No medications on file  Previous Medications   ACETAMINOPHEN (TYLENOL) 325 MG TABLET    Take 650 mg by mouth every 4 (four) hours as needed.   ASPIRIN 81 MG CHEWABLE TABLET    Chew 1 tablet (81 mg total) by mouth daily.   ATORVASTATIN (LIPITOR) 80 MG TABLET    TAKE 1 TABLET AT BEDTIME   BACLOFEN (LIORESAL) 20 MG TABLET    Take 20 mg by mouth 3 (three) times daily. For Muscle Spasms And at Bedtime as needed for muscle spasms   BISACODYL (DULCOLAX) 10 MG SUPPOSITORY    Place 10 mg rectally as needed for moderate constipation.   CARVEDILOL (COREG) 6.25 MG TABLET    Take 6.25 mg by mouth 2 (two) times daily with a meal.   CHOLECALCIFEROL 50 MCG (2000 UT) TABS    Take 1 tablet by mouth daily in the afternoon.   FUROSEMIDE (LASIX) 20 MG TABLET    Take 20 mg by mouth daily.   LACOSAMIDE 100 MG TABS    Take 1 tablet by mouth 2 (two) times daily.   LEVETIRACETAM (KEPPRA) 750 MG TABLET    Take 2 tablets (1,500 mg total) by mouth 2 (two) times daily.   MAGNESIUM HYDROXIDE (MILK OF MAGNESIA PO)    Take 30 mLs by mouth as needed.   METOPROLOL SUCCINATE (TOPROL XL) 25 MG 24 HR TABLET    Take 1 tablet (25 mg total) by mouth daily.   NUTRITIONAL SUPPLEMENTS (NUTRITIONAL SUPPLEMENT PO)    Take 120 mLs by mouth 2 (two) times daily. Medpass for weight gain.   POLYETHYLENE GLYCOL (MIRALAX / GLYCOLAX) 17 G PACKET    Take 17 g by mouth daily as needed.   SACUBITRIL-VALSARTAN (ENTRESTO) 49-51 MG    Take 1 tablet by mouth 2 (two) times daily.   SODIUM PHOSPHATES (RA SALINE ENEMA RE)    Place rectally as needed.   SPIRONOLACTONE (ALDACTONE) 25 MG TABLET    Take 0.5 tablets (12.5 mg total) by  mouth daily.   TRAMADOL (ULTRAM) 50 MG TABLET    Take 50 mg by mouth every 6 (six) hours as needed.   WARFARIN (COUMADIN) 4 MG TABLET    Take 4 mg by mouth every evening.  Modified Medications   No medications on file  Discontinued Medications   No medications on file    Physical Exam:  Vitals:   06/22/20 1038  BP: 116/68  Pulse: 74  Temp: (!) 97.5 F (36.4 C)  TempSrc: Temporal  SpO2: 97%  Height: 5\' 6"  (1.676 m)   Body mass index is 19.05 kg/m. Wt Readings from Last 3 Encounters:  06/15/20 118 lb (  53.5 kg)  06/08/20 118 lb (53.5 kg)  05/31/20 118 lb (53.5 kg)    Physical Exam Nursing note reviewed.  Cardiovascular:     Rate and Rhythm: Normal rate and regular rhythm.  Pulmonary:     Effort: Pulmonary effort is normal.     Breath sounds: Normal breath sounds.  Abdominal:     General: Abdomen is flat. Bowel sounds are normal.     Palpations: Abdomen is soft.  Neurological:     Comments: Unable to assess orientation since she is aphasic She has right hemiparesis secondary to CVA.  She is seated in a wheelchair and is mostly total care.  She is able to pull up from the wheelchair and stand on her left leg. She wears a splint on her right wrist ostensibly to prevent contracture  Psychiatric:        Mood and Affect: Mood normal.     Labs reviewed: Basic Metabolic Panel: Recent Labs    04/25/20 2044 04/26/20 0014 04/26/20 0117 04/26/20 0811 04/26/20 1555 04/27/20 0359 05/04/20 0232 05/12/20 0000 05/13/20 1208 06/15/20 0908  NA 136   < > 136   < > 137   < > 136 138 137 138  K 3.7   < > 4.1   < > 4.3   < > 3.9 4.3 3.9 4.0  CL 107  --  105  --  106   < > 105 104 107 105  CO2 22  --  22  --  25   < > 24 21 17* 25  GLUCOSE 110*  --  170*  --  139*   < > 100*  --  96 83  BUN 12  --  13  --  13   < > 11 20 19 10   CREATININE 0.78  --  0.90  --  1.06*   < > 0.83 0.7 0.78 0.87  CALCIUM 8.5*  --  8.7*  --  8.2*   < > 8.5* 9.2 8.9 8.8*  MG 2.7*  --  2.9*  --  2.2   --   --   --   --   --    < > = values in this interval not displayed.   Liver Function Tests: Recent Labs    10/08/19 1031 12/14/19 1020 04/21/20 0825  AST 24 24 28   ALT 13 9 15   ALKPHOS 161* 156* 95  BILITOT <0.2 0.3 0.4  PROT 7.3 7.5 6.5  ALBUMIN 4.2 4.3 3.3*   No results for input(s): LIPASE, AMYLASE in the last 8760 hours. No results for input(s): AMMONIA in the last 8760 hours. CBC: Recent Labs    03/02/20 1221 04/21/20 0825 04/21/20 0832 05/03/20 0111 05/04/20 0232 05/05/20 0031 05/12/20 0000  WBC 6.8 9.2   < > 14.7* 12.6* 14.0* 9.1  NEUTROABS 4.0 1.7  --   --   --   --   --   HGB 12.8 11.1*   < > 9.6* 9.4* 9.4* 10.6*  HCT 38.5 37.0   < > 29.0* 28.2* 28.5* 32*  MCV 85 95.9   < > 87.3 87.6 88.5  --   PLT 303 322   < > 356 429* 533* 643*   < > = values in this interval not displayed.   Lipid Panel: Recent Labs    10/08/19 1031 04/22/20 0426  CHOL 120 124  HDL 39* 41  LDLCALC 61 64  TRIG 107 94  CHOLHDL 3.1 3.0  TSH: No results for input(s): TSH in the last 8760 hours. A1C: Lab Results  Component Value Date   HGBA1C 6.1 (H) 04/22/2020     Assessment/Plan  1. Primary hypertension Blood pressure is good at 116/68 medications influencing blood pressure include metoprolol as well as Entresto and Aldactone.  2. Complete heart block [facility]) She has a pacemaker.  When she was admitted to the hospital back in April with heart block she had been on dig as well as carvedilol.  These were discontinued and I am not sure I understand why metoprolol was added back.  This should be addressed by CVTS who restarted  3. Chronic combined systolic and diastolic heart failure (HCC) No dependent edema and breathing is noncomplicated.  Continue with diuretic therapy as well as Entresto  4. Coronary artery disease involving native coronary artery of native heart without angina pectoris No chest pain has been seen in follow-up by CVTS and seems to be doing well  5.  Seizure disorder [facility]) She is on Keppra as well as Vimpat.  Has remote follow-up with neurology.  Details of seizure disorder are not clear to me at this point.  6. History of CVA (cerebrovascular accident) There is record in EMR of stroke going back to 2013.  If that is truly the case not sure if current PT will have much impact  7. Mixed hyperlipidemia On atorvastatin 80 mg and most recent LDL was 64, at goal  8. Anticoagulated on Coumadin Followed in the Coumadin clinic apparently at [place], [place] [phone]

## 2020-06-23 ENCOUNTER — Ambulatory Visit (HOSPITAL_COMMUNITY)
Admission: RE | Admit: 2020-06-23 | Discharge: 2020-06-23 | Disposition: A | Discharge status: Home / Self Care | Payer: Medicare HMO | Source: Ambulatory Visit | Attending: Internal Medicine | Admitting: Internal Medicine

## 2020-06-23 DIAGNOSIS — I5042 Chronic combined systolic (congestive) and diastolic (congestive) heart failure: Secondary | ICD-10-CM | POA: Diagnosis not present

## 2020-06-23 LAB — BASIC METABOLIC PANEL
Anion gap: 8 (ref 5–15)
BUN: 10 mg/dL (ref 8–23)
CO2: 24 mmol/L (ref 22–32)
Calcium: 9.2 mg/dL (ref 8.9–10.3)
Chloride: 107 mmol/L (ref 98–111)
Creatinine, Ser: 0.92 mg/dL (ref 0.44–1.00)
GFR, Estimated: >60 mL/min (ref >60)
Glucose, Bld: 98 mg/dL (ref 70–99)
Potassium: 4.1 mmol/L (ref 3.5–5.1)
Sodium: 139 mmol/L (ref 135–145)

## 2020-06-24 DIAGNOSIS — I5042 Chronic combined systolic (congestive) and diastolic (congestive) heart failure: Secondary | ICD-10-CM | POA: Diagnosis not present

## 2020-06-24 DIAGNOSIS — Z48812 Encounter for surgical aftercare following surgery on the circulatory system: Secondary | ICD-10-CM | POA: Diagnosis not present

## 2020-06-24 DIAGNOSIS — I69351 Hemiplegia and hemiparesis following cerebral infarction affecting right dominant side: Secondary | ICD-10-CM | POA: Diagnosis not present

## 2020-06-24 DIAGNOSIS — I442 Atrioventricular block, complete: Secondary | ICD-10-CM | POA: Diagnosis not present

## 2020-06-24 DIAGNOSIS — I69391 Dysphagia following cerebral infarction: Secondary | ICD-10-CM | POA: Diagnosis not present

## 2020-06-24 DIAGNOSIS — I2510 Atherosclerotic heart disease of native coronary artery without angina pectoris: Secondary | ICD-10-CM | POA: Diagnosis not present

## 2020-06-24 DIAGNOSIS — R1311 Dysphagia, oral phase: Secondary | ICD-10-CM | POA: Diagnosis not present

## 2020-06-24 DIAGNOSIS — I110 Hypertensive heart disease with heart failure: Secondary | ICD-10-CM | POA: Diagnosis not present

## 2020-06-24 DIAGNOSIS — I69320 Aphasia following cerebral infarction: Secondary | ICD-10-CM | POA: Diagnosis not present

## 2020-06-30 DIAGNOSIS — I2510 Atherosclerotic heart disease of native coronary artery without angina pectoris: Secondary | ICD-10-CM | POA: Diagnosis not present

## 2020-06-30 DIAGNOSIS — I69351 Hemiplegia and hemiparesis following cerebral infarction affecting right dominant side: Secondary | ICD-10-CM | POA: Diagnosis not present

## 2020-06-30 DIAGNOSIS — I69320 Aphasia following cerebral infarction: Secondary | ICD-10-CM | POA: Diagnosis not present

## 2020-06-30 DIAGNOSIS — I110 Hypertensive heart disease with heart failure: Secondary | ICD-10-CM | POA: Diagnosis not present

## 2020-06-30 DIAGNOSIS — I5042 Chronic combined systolic (congestive) and diastolic (congestive) heart failure: Secondary | ICD-10-CM | POA: Diagnosis not present

## 2020-06-30 DIAGNOSIS — R1311 Dysphagia, oral phase: Secondary | ICD-10-CM | POA: Diagnosis not present

## 2020-06-30 DIAGNOSIS — I442 Atrioventricular block, complete: Secondary | ICD-10-CM | POA: Diagnosis not present

## 2020-06-30 DIAGNOSIS — Z48812 Encounter for surgical aftercare following surgery on the circulatory system: Secondary | ICD-10-CM | POA: Diagnosis not present

## 2020-06-30 DIAGNOSIS — I69391 Dysphagia following cerebral infarction: Secondary | ICD-10-CM | POA: Diagnosis not present

## 2020-07-01 DIAGNOSIS — I5042 Chronic combined systolic (congestive) and diastolic (congestive) heart failure: Secondary | ICD-10-CM | POA: Diagnosis not present

## 2020-07-01 DIAGNOSIS — I110 Hypertensive heart disease with heart failure: Secondary | ICD-10-CM | POA: Diagnosis not present

## 2020-07-01 DIAGNOSIS — I69320 Aphasia following cerebral infarction: Secondary | ICD-10-CM | POA: Diagnosis not present

## 2020-07-01 DIAGNOSIS — I69391 Dysphagia following cerebral infarction: Secondary | ICD-10-CM | POA: Diagnosis not present

## 2020-07-01 DIAGNOSIS — Z48812 Encounter for surgical aftercare following surgery on the circulatory system: Secondary | ICD-10-CM | POA: Diagnosis not present

## 2020-07-01 DIAGNOSIS — I69351 Hemiplegia and hemiparesis following cerebral infarction affecting right dominant side: Secondary | ICD-10-CM | POA: Diagnosis not present

## 2020-07-01 DIAGNOSIS — I2510 Atherosclerotic heart disease of native coronary artery without angina pectoris: Secondary | ICD-10-CM | POA: Diagnosis not present

## 2020-07-01 DIAGNOSIS — R1311 Dysphagia, oral phase: Secondary | ICD-10-CM | POA: Diagnosis not present

## 2020-07-01 DIAGNOSIS — I442 Atrioventricular block, complete: Secondary | ICD-10-CM | POA: Diagnosis not present

## 2020-07-06 ENCOUNTER — Other Ambulatory Visit: Payer: Self-pay | Admitting: Diagnostic Neuroimaging

## 2020-07-06 DIAGNOSIS — G40909 Epilepsy, unspecified, not intractable, without status epilepticus: Secondary | ICD-10-CM

## 2020-07-06 MED ORDER — LACOSAMIDE 100 MG PO TABS: 1.0000 | ORAL_TABLET | Freq: Two times a day (BID) | ORAL | 5 refills | Status: DC

## 2020-07-06 NOTE — Telephone Encounter (Signed)
Called [employer], spoke with Colletta [place] who stated they need new Rx for Vimpat sent to [place]. I advised will escribe it. She stated that is fine, verbalized understanding, appreciation.  E scribe request sent to Dr Krista Blue as work in [place].

## 2020-07-06 NOTE — Telephone Encounter (Signed)
Kayla from [employer] called and LVM stating that they have put in the request for the pt's Vimpat and they have yet to receive it. Please advise.

## 2020-07-07 DIAGNOSIS — I110 Hypertensive heart disease with heart failure: Secondary | ICD-10-CM | POA: Diagnosis not present

## 2020-07-07 DIAGNOSIS — R1311 Dysphagia, oral phase: Secondary | ICD-10-CM | POA: Diagnosis not present

## 2020-07-07 DIAGNOSIS — I442 Atrioventricular block, complete: Secondary | ICD-10-CM | POA: Diagnosis not present

## 2020-07-07 DIAGNOSIS — Z48812 Encounter for surgical aftercare following surgery on the circulatory system: Secondary | ICD-10-CM | POA: Diagnosis not present

## 2020-07-07 DIAGNOSIS — I69351 Hemiplegia and hemiparesis following cerebral infarction affecting right dominant side: Secondary | ICD-10-CM | POA: Diagnosis not present

## 2020-07-07 DIAGNOSIS — I69391 Dysphagia following cerebral infarction: Secondary | ICD-10-CM | POA: Diagnosis not present

## 2020-07-07 DIAGNOSIS — I5042 Chronic combined systolic (congestive) and diastolic (congestive) heart failure: Secondary | ICD-10-CM | POA: Diagnosis not present

## 2020-07-07 DIAGNOSIS — I2510 Atherosclerotic heart disease of native coronary artery without angina pectoris: Secondary | ICD-10-CM | POA: Diagnosis not present

## 2020-07-07 DIAGNOSIS — I69320 Aphasia following cerebral infarction: Secondary | ICD-10-CM | POA: Diagnosis not present

## 2020-07-11 DIAGNOSIS — I69351 Hemiplegia and hemiparesis following cerebral infarction affecting right dominant side: Secondary | ICD-10-CM | POA: Diagnosis not present

## 2020-07-11 DIAGNOSIS — I2510 Atherosclerotic heart disease of native coronary artery without angina pectoris: Secondary | ICD-10-CM | POA: Diagnosis not present

## 2020-07-11 DIAGNOSIS — Z48812 Encounter for surgical aftercare following surgery on the circulatory system: Secondary | ICD-10-CM | POA: Diagnosis not present

## 2020-07-11 DIAGNOSIS — R1311 Dysphagia, oral phase: Secondary | ICD-10-CM | POA: Diagnosis not present

## 2020-07-11 DIAGNOSIS — I442 Atrioventricular block, complete: Secondary | ICD-10-CM | POA: Diagnosis not present

## 2020-07-11 DIAGNOSIS — I5042 Chronic combined systolic (congestive) and diastolic (congestive) heart failure: Secondary | ICD-10-CM | POA: Diagnosis not present

## 2020-07-11 DIAGNOSIS — I110 Hypertensive heart disease with heart failure: Secondary | ICD-10-CM | POA: Diagnosis not present

## 2020-07-11 DIAGNOSIS — I69320 Aphasia following cerebral infarction: Secondary | ICD-10-CM | POA: Diagnosis not present

## 2020-07-11 DIAGNOSIS — I69391 Dysphagia following cerebral infarction: Secondary | ICD-10-CM | POA: Diagnosis not present

## 2020-07-14 DIAGNOSIS — I2510 Atherosclerotic heart disease of native coronary artery without angina pectoris: Secondary | ICD-10-CM | POA: Diagnosis not present

## 2020-07-14 DIAGNOSIS — I110 Hypertensive heart disease with heart failure: Secondary | ICD-10-CM | POA: Diagnosis not present

## 2020-07-14 DIAGNOSIS — I442 Atrioventricular block, complete: Secondary | ICD-10-CM | POA: Diagnosis not present

## 2020-07-14 DIAGNOSIS — R1311 Dysphagia, oral phase: Secondary | ICD-10-CM | POA: Diagnosis not present

## 2020-07-14 DIAGNOSIS — I69351 Hemiplegia and hemiparesis following cerebral infarction affecting right dominant side: Secondary | ICD-10-CM | POA: Diagnosis not present

## 2020-07-14 DIAGNOSIS — I69391 Dysphagia following cerebral infarction: Secondary | ICD-10-CM | POA: Diagnosis not present

## 2020-07-14 DIAGNOSIS — I69320 Aphasia following cerebral infarction: Secondary | ICD-10-CM | POA: Diagnosis not present

## 2020-07-14 DIAGNOSIS — Z48812 Encounter for surgical aftercare following surgery on the circulatory system: Secondary | ICD-10-CM | POA: Diagnosis not present

## 2020-07-14 DIAGNOSIS — I5042 Chronic combined systolic (congestive) and diastolic (congestive) heart failure: Secondary | ICD-10-CM | POA: Diagnosis not present

## 2020-07-19 DIAGNOSIS — I5042 Chronic combined systolic (congestive) and diastolic (congestive) heart failure: Secondary | ICD-10-CM | POA: Diagnosis not present

## 2020-07-19 DIAGNOSIS — I69351 Hemiplegia and hemiparesis following cerebral infarction affecting right dominant side: Secondary | ICD-10-CM | POA: Diagnosis not present

## 2020-07-19 DIAGNOSIS — Z48812 Encounter for surgical aftercare following surgery on the circulatory system: Secondary | ICD-10-CM | POA: Diagnosis not present

## 2020-07-19 DIAGNOSIS — I110 Hypertensive heart disease with heart failure: Secondary | ICD-10-CM | POA: Diagnosis not present

## 2020-07-19 DIAGNOSIS — I2510 Atherosclerotic heart disease of native coronary artery without angina pectoris: Secondary | ICD-10-CM | POA: Diagnosis not present

## 2020-07-19 DIAGNOSIS — R1311 Dysphagia, oral phase: Secondary | ICD-10-CM | POA: Diagnosis not present

## 2020-07-19 DIAGNOSIS — I442 Atrioventricular block, complete: Secondary | ICD-10-CM | POA: Diagnosis not present

## 2020-07-19 DIAGNOSIS — I69320 Aphasia following cerebral infarction: Secondary | ICD-10-CM | POA: Diagnosis not present

## 2020-07-19 DIAGNOSIS — I69391 Dysphagia following cerebral infarction: Secondary | ICD-10-CM | POA: Diagnosis not present

## 2020-07-21 ENCOUNTER — Other Ambulatory Visit (HOSPITAL_COMMUNITY): Payer: Self-pay

## 2020-07-21 ENCOUNTER — Encounter (HOSPITAL_COMMUNITY): Payer: Self-pay

## 2020-07-21 ENCOUNTER — Other Ambulatory Visit: Payer: Self-pay

## 2020-07-21 ENCOUNTER — Ambulatory Visit (HOSPITAL_COMMUNITY)
Admission: RE | Admit: 2020-07-21 | Discharge: 2020-07-21 | Disposition: A | Discharge status: Home / Self Care | Payer: Medicare HMO | Source: Ambulatory Visit | Attending: Internal Medicine | Admitting: Internal Medicine

## 2020-07-21 VITALS — BP 104/70 | HR 72

## 2020-07-21 DIAGNOSIS — I2510 Atherosclerotic heart disease of native coronary artery without angina pectoris: Secondary | ICD-10-CM | POA: Diagnosis not present

## 2020-07-21 DIAGNOSIS — G40909 Epilepsy, unspecified, not intractable, without status epilepticus: Secondary | ICD-10-CM | POA: Insufficient documentation

## 2020-07-21 DIAGNOSIS — Z7901 Long term (current) use of anticoagulants: Secondary | ICD-10-CM | POA: Diagnosis not present

## 2020-07-21 DIAGNOSIS — I69320 Aphasia following cerebral infarction: Secondary | ICD-10-CM | POA: Insufficient documentation

## 2020-07-21 DIAGNOSIS — I5042 Chronic combined systolic (congestive) and diastolic (congestive) heart failure: Secondary | ICD-10-CM

## 2020-07-21 DIAGNOSIS — I255 Ischemic cardiomyopathy: Secondary | ICD-10-CM | POA: Insufficient documentation

## 2020-07-21 DIAGNOSIS — I69351 Hemiplegia and hemiparesis following cerebral infarction affecting right dominant side: Secondary | ICD-10-CM | POA: Diagnosis not present

## 2020-07-21 DIAGNOSIS — I5022 Chronic systolic (congestive) heart failure: Secondary | ICD-10-CM | POA: Diagnosis not present

## 2020-07-21 DIAGNOSIS — I442 Atrioventricular block, complete: Secondary | ICD-10-CM | POA: Diagnosis not present

## 2020-07-21 MED ORDER — EMPAGLIFLOZIN 10 MG PO TABS: 10.0000 mg | ORAL_TABLET | Freq: Every day | ORAL | 5 refills | Status: DC

## 2020-07-21 NOTE — Patient Instructions (Signed)
It was a pleasure seeing you today!  MEDICATIONS: -We are changing your medications today -Start taking Jardiance 10mg  by mouth daily -Call if you have questions about your medications.   NEXT APPOINTMENT: Return to clinic in 1 month on July 25th with a NP/PA with the HF clinic .  In general, to take care of your heart failure: -Limit your fluid intake to 2 Liters (half-gallon) per day.   -Limit your salt intake to ideally 2-3 grams (2000-3000 mg) per day. -Weigh yourself daily and record, and bring that "weight diary" to your next appointment.  (Weight gain of 2-3 pounds in 1 day typically means fluid weight.) -The medications for your heart are to help your heart and help you live longer.   -Please contact us before stopping any of your heart medications.  Call the clinic at [phone] with questions or to reschedule future appointments.

## 2020-07-21 NOTE — Progress Notes (Signed)
The patient was approved for a [employer] through the [employer].  BIN: [id number] PCN: [id number] ID: [id number] Group: [id number]   Approved through 07/20/2021  Amount: $1,000 Phone Number: [phone]

## 2020-07-21 NOTE — Progress Notes (Signed)
PCP: Just, [name], FNP (Inactive) HF Cardiology: Dr. Aundra Dubin  HPI:  70 y.o. F with history of CVA, chronic systolic CHF, CHB, and CAD returns for followup of CHF.   She had a stroke in 2009, thought to be cardioembolic.  She has had right hemiparesis and aphasia since that time.   [name] was hospitalized in August 2020 with a seizure after being out of her medications for 4 days.  She required intubation.  High-sensitivity troponin trended to 1905. An echocardiogram 08/2018 revealed EF of 35 to 40% with global hypokinesis and moderate LVH, elevated LVEDP.  She also had moderate AR. Nuclear stress test in August 2020 showed a large severe fixed inferior, anterior apical, and inferolateral perfusion defect suggestive of scar without ischemia.  Study was high risk due to EF estimated at 34% but mentions no significant reversible ischemia. She was deemed not a good candidate for cardiac catheterization during that hospitalization. She has followed with Dr. Oval [name] and was last seen in November 2021.    Patient was admitted in 3/22 following a syncopal episode and was found to be in complete heart block.  This improved initially after stopping nodal blockers.  Echo in 3/22 showed EF 35-40%.  LHC showed significant left main and LCx disease.  She was deemed to be a poor candidate for PCI.  In 4/22, she had CABG with LIMA-LAD and SVG-OM2.  Post-op, she developed CHB again.  St Jude CRT-P device was placed. She was discharged to a rehab facility.   Had post hospital f/u on 4/22. At the time, volume status was stable and w/ NYHA class II symptoms. Toprol was increased to 25 mg daily. No other med changes. Instructed to f/u in 4 weeks.  Returned to HF clinic for f/u on 06/15/20. Present in wheelchair w/ husband. Was released from SNF early May. Back at home and getting home health PT but husband reported she has not been very cooperative w/ PT. Still primarily wheelchair dependent. Able to make short  transfers w/ walker, but unable to stand/walk for long periods of time. Has slight dyspnea w/ transfers. Unsure of wt gain, pt unable to stand for wt (charted wt was self reported). No CP. BP stable, 122/55. Denies dizziness. Appeitite has been good. Euvolemic on exam, REDS clip 23%.   Today she returns to HF clinic for pharmacist medication titration. At last visit with MD, Delene Loll was increased to 49/51 mg BID and lasix was changed to PRN. Overall she is feeling good. Denies dizziness, lightheadedness, fatigue, CP, and palpitations. Breathing is good, no SOB/PND/orthopnea. Has not had to take any Lasix doses recently. Weight at home is unknown as she is in wheelchair and unable to stand. Unable to stand for weight in clinic today. Appetite good. Husband assists with lifting patient out of wheelchair to use the bathroom.   HF Medications: Metoprolol succinate 25 mg daily Entresto 49/51 mg BID Spironolactone 12.5 mg daily Furosemide 20 mg daily PRN  Has the patient been experiencing any side effects to the medications prescribed?  no  Does the patient have any problems obtaining medications due to transportation or finances?   Yes - Jardiance was unaffordable at $45/mo Wilder Glade would have been $95/mo) >> obtained PAN HF grant to assist with co-pay; receives Entresto from [employer] patient assistance program  Understanding of regimen: good Understanding of indications: good Potential of compliance: good Patient understands to avoid NSAIDs. Patient understands to avoid decongestants.    Pertinent Lab Values: Serum creatinine 0.92,  BUN 10, Potassium 4.1, Sodium 139   Vital Signs: Weight: Last reported weight: 118 lbs (unable to stand on scale, pt reported weight from home) Blood pressure: 104/70 mmHg Heart rate: 72 bpm  Assessment/Plan: 1. Complete heart block: Patient had baseline NSR with LAFB/RBBB, so there was underlying conduction system disease.  She was transiently in CHB pre-CABG  and developed persistent CHB post-CABG.  She now has [employer] CRT-P device.    2. Chronic systolic CHF: Ischemic cardiomyopathy.  Echo in 8/20 with EF 35-40% and wall motion abnormalities. LHC in 4/22 showed left main/severe codominant LCx disease.  Echo in 3/22 with EF 35-40%, inferior and lateral hypokinesis.  Now s/p CABG. Unable to assess functional class as pt is primarily wheelchair dependent. Denies SOB at rest, PND/orthopnea. Euvolemic on exam, no LEE. BP soft 104/70.  - Continue Toprol XL 25 mg daily  - Continue Entresto 49/51 mg BID - Continue spironolactone 12.5 mg daily - Start Jardiance 10 mg daily, PAN HF grant obtained to assist with co-pay - Continue furosemide 20 mg daily PRN (has not had to use any doses)  3. 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  Has been on warfarin.  Has had long-standing aphasia and right hemiparesis.   - Continue warfarin.    - Encouraged to improve participation w/ home PT   4. H/o seizure disorder: Continue Keppra.  5. CAD:  LHC showed 60-70% distal left main stenosis, heavily calcified proximal LCx with 60-70% stenosis at the ostium and discrete 99% stenosis in the proximal LCx.  I suspect that her CHB is related to chronic ischemia from LM-LCx disease worsened by nodal blockade. Had CABG x 2 4/22 w/ LIMA-LAD, SVG-OM2.  - Stable w/o CP  - Continue ASA 81 daily. - Continue statin.   Follow up in HF clinic in 1 month with APP   Michela Pitcher) [place], PharmD Student  Kerby Nora, PharmD, BCPS [facility] Pharmacist [phone]

## 2020-07-27 DIAGNOSIS — I69351 Hemiplegia and hemiparesis following cerebral infarction affecting right dominant side: Secondary | ICD-10-CM | POA: Diagnosis not present

## 2020-07-27 DIAGNOSIS — Z48812 Encounter for surgical aftercare following surgery on the circulatory system: Secondary | ICD-10-CM | POA: Diagnosis not present

## 2020-07-27 DIAGNOSIS — I69391 Dysphagia following cerebral infarction: Secondary | ICD-10-CM | POA: Diagnosis not present

## 2020-07-27 DIAGNOSIS — I110 Hypertensive heart disease with heart failure: Secondary | ICD-10-CM | POA: Diagnosis not present

## 2020-07-27 DIAGNOSIS — R1311 Dysphagia, oral phase: Secondary | ICD-10-CM | POA: Diagnosis not present

## 2020-07-27 DIAGNOSIS — I2510 Atherosclerotic heart disease of native coronary artery without angina pectoris: Secondary | ICD-10-CM | POA: Diagnosis not present

## 2020-07-27 DIAGNOSIS — I5042 Chronic combined systolic (congestive) and diastolic (congestive) heart failure: Secondary | ICD-10-CM | POA: Diagnosis not present

## 2020-07-27 DIAGNOSIS — I69320 Aphasia following cerebral infarction: Secondary | ICD-10-CM | POA: Diagnosis not present

## 2020-07-27 DIAGNOSIS — I442 Atrioventricular block, complete: Secondary | ICD-10-CM | POA: Diagnosis not present

## 2020-07-28 DIAGNOSIS — I5042 Chronic combined systolic (congestive) and diastolic (congestive) heart failure: Secondary | ICD-10-CM | POA: Diagnosis not present

## 2020-07-28 DIAGNOSIS — I442 Atrioventricular block, complete: Secondary | ICD-10-CM | POA: Diagnosis not present

## 2020-07-28 DIAGNOSIS — I69320 Aphasia following cerebral infarction: Secondary | ICD-10-CM | POA: Diagnosis not present

## 2020-07-28 DIAGNOSIS — I2510 Atherosclerotic heart disease of native coronary artery without angina pectoris: Secondary | ICD-10-CM | POA: Diagnosis not present

## 2020-07-28 DIAGNOSIS — I69351 Hemiplegia and hemiparesis following cerebral infarction affecting right dominant side: Secondary | ICD-10-CM | POA: Diagnosis not present

## 2020-07-28 DIAGNOSIS — I110 Hypertensive heart disease with heart failure: Secondary | ICD-10-CM | POA: Diagnosis not present

## 2020-07-28 DIAGNOSIS — Z48812 Encounter for surgical aftercare following surgery on the circulatory system: Secondary | ICD-10-CM | POA: Diagnosis not present

## 2020-07-28 DIAGNOSIS — R1311 Dysphagia, oral phase: Secondary | ICD-10-CM | POA: Diagnosis not present

## 2020-07-28 DIAGNOSIS — I69391 Dysphagia following cerebral infarction: Secondary | ICD-10-CM | POA: Diagnosis not present

## 2020-08-01 ENCOUNTER — Ambulatory Visit (INDEPENDENT_AMBULATORY_CARE_PROVIDER_SITE_OTHER): Payer: Medicare HMO

## 2020-08-01 DIAGNOSIS — I5042 Chronic combined systolic (congestive) and diastolic (congestive) heart failure: Secondary | ICD-10-CM

## 2020-08-01 LAB — CUP PACEART REMOTE DEVICE CHECK
Battery Remaining Longevity: 41 mo
Battery Remaining Percentage: 93 %
Battery Voltage: 2.96 V
Brady Statistic AP VP Percent: 2.4 %
Brady Statistic AP VS Percent: 1 %
Brady Statistic AS VP Percent: 97 %
Brady Statistic AS VS Percent: 1 %
Brady Statistic RA Percent Paced: 2 %
Date Time Interrogation Session: 20220711032551
Implantable Lead Implant Date: 20220411
Implantable Lead Implant Date: 20220411
Implantable Lead Implant Date: 20220411
Implantable Lead Location: 753858
Implantable Lead Location: 753859
Implantable Lead Location: 753860
Implantable Pulse Generator Implant Date: 20220411
Lead Channel Impedance Value: 410 Ohm
Lead Channel Impedance Value: 410 Ohm
Lead Channel Impedance Value: 430 Ohm
Lead Channel Pacing Threshold Amplitude: 0.75 V
Lead Channel Pacing Threshold Amplitude: 1 V
Lead Channel Pacing Threshold Amplitude: 1.625 V
Lead Channel Pacing Threshold Pulse Width: 0.5 ms
Lead Channel Pacing Threshold Pulse Width: 0.5 ms
Lead Channel Pacing Threshold Pulse Width: 0.5 ms
Lead Channel Sensing Intrinsic Amplitude: 3.7 mV
Lead Channel Setting Pacing Amplitude: 2.625
Lead Channel Setting Pacing Amplitude: 3.5 V
Lead Channel Setting Pacing Amplitude: 5 V
Lead Channel Setting Pacing Pulse Width: 0.5 ms
Lead Channel Setting Pacing Pulse Width: 0.5 ms
Lead Channel Setting Sensing Sensitivity: 4 mV
Pulse Gen Model: 3562
Pulse Gen Serial Number: 6262141

## 2020-08-05 DIAGNOSIS — I69391 Dysphagia following cerebral infarction: Secondary | ICD-10-CM | POA: Diagnosis not present

## 2020-08-05 DIAGNOSIS — Z48812 Encounter for surgical aftercare following surgery on the circulatory system: Secondary | ICD-10-CM | POA: Diagnosis not present

## 2020-08-05 DIAGNOSIS — I2510 Atherosclerotic heart disease of native coronary artery without angina pectoris: Secondary | ICD-10-CM | POA: Diagnosis not present

## 2020-08-05 DIAGNOSIS — I5042 Chronic combined systolic (congestive) and diastolic (congestive) heart failure: Secondary | ICD-10-CM | POA: Diagnosis not present

## 2020-08-05 DIAGNOSIS — R1311 Dysphagia, oral phase: Secondary | ICD-10-CM | POA: Diagnosis not present

## 2020-08-05 DIAGNOSIS — I442 Atrioventricular block, complete: Secondary | ICD-10-CM | POA: Diagnosis not present

## 2020-08-05 DIAGNOSIS — I69351 Hemiplegia and hemiparesis following cerebral infarction affecting right dominant side: Secondary | ICD-10-CM | POA: Diagnosis not present

## 2020-08-05 DIAGNOSIS — I110 Hypertensive heart disease with heart failure: Secondary | ICD-10-CM | POA: Diagnosis not present

## 2020-08-05 DIAGNOSIS — I69320 Aphasia following cerebral infarction: Secondary | ICD-10-CM | POA: Diagnosis not present

## 2020-08-08 ENCOUNTER — Other Ambulatory Visit: Payer: Self-pay | Admitting: *Deleted

## 2020-08-08 MED ORDER — ATORVASTATIN CALCIUM 80 MG PO TABS: 80.0000 mg | ORAL_TABLET | Freq: Every day | ORAL | 1 refills | Status: DC

## 2020-08-08 NOTE — Telephone Encounter (Signed)
Patient requested refill

## 2020-08-09 ENCOUNTER — Ambulatory Visit (INDEPENDENT_AMBULATORY_CARE_PROVIDER_SITE_OTHER): Payer: Medicare HMO | Admitting: Cardiology

## 2020-08-09 ENCOUNTER — Emergency Department (HOSPITAL_COMMUNITY)
Admission: EM | Admit: 2020-08-09 | Discharge: 2020-08-09 | Disposition: A | Discharge status: Home / Self Care | Payer: Medicare HMO | Attending: Emergency Medicine | Admitting: Emergency Medicine

## 2020-08-09 ENCOUNTER — Other Ambulatory Visit: Payer: Self-pay

## 2020-08-09 ENCOUNTER — Encounter: Payer: Self-pay | Admitting: Cardiology

## 2020-08-09 ENCOUNTER — Emergency Department (HOSPITAL_COMMUNITY): Payer: Medicare HMO

## 2020-08-09 VITALS — BP 108/60 | HR 74 | Ht 66.0 in | Wt 120.0 lb

## 2020-08-09 DIAGNOSIS — Z79899 Other long term (current) drug therapy: Secondary | ICD-10-CM | POA: Diagnosis not present

## 2020-08-09 DIAGNOSIS — Z7901 Long term (current) use of anticoagulants: Secondary | ICD-10-CM | POA: Diagnosis not present

## 2020-08-09 DIAGNOSIS — R0789 Other chest pain: Secondary | ICD-10-CM | POA: Diagnosis not present

## 2020-08-09 DIAGNOSIS — Z87891 Personal history of nicotine dependence: Secondary | ICD-10-CM | POA: Diagnosis not present

## 2020-08-09 DIAGNOSIS — Z7982 Long term (current) use of aspirin: Secondary | ICD-10-CM | POA: Insufficient documentation

## 2020-08-09 DIAGNOSIS — I5042 Chronic combined systolic (congestive) and diastolic (congestive) heart failure: Secondary | ICD-10-CM | POA: Diagnosis not present

## 2020-08-09 DIAGNOSIS — I442 Atrioventricular block, complete: Secondary | ICD-10-CM | POA: Diagnosis not present

## 2020-08-09 DIAGNOSIS — R079 Chest pain, unspecified: Secondary | ICD-10-CM | POA: Diagnosis not present

## 2020-08-09 DIAGNOSIS — Z951 Presence of aortocoronary bypass graft: Secondary | ICD-10-CM | POA: Insufficient documentation

## 2020-08-09 DIAGNOSIS — R002 Palpitations: Secondary | ICD-10-CM | POA: Diagnosis not present

## 2020-08-09 DIAGNOSIS — R0602 Shortness of breath: Secondary | ICD-10-CM | POA: Insufficient documentation

## 2020-08-09 DIAGNOSIS — Z950 Presence of cardiac pacemaker: Secondary | ICD-10-CM | POA: Insufficient documentation

## 2020-08-09 DIAGNOSIS — I110 Hypertensive heart disease with heart failure: Secondary | ICD-10-CM | POA: Insufficient documentation

## 2020-08-09 DIAGNOSIS — I2510 Atherosclerotic heart disease of native coronary artery without angina pectoris: Secondary | ICD-10-CM | POA: Diagnosis not present

## 2020-08-09 LAB — CBC WITH DIFFERENTIAL/PLATELET
Abs Immature Granulocytes: 0.01 10*3/uL (ref 0.00–0.07)
Basophils Absolute: 0.1 10*3/uL (ref 0.0–0.1)
Basophils Relative: 1 %
Eosinophils Absolute: 0.1 10*3/uL (ref 0.0–0.5)
Eosinophils Relative: 1 %
HCT: 40.7 % (ref 36.0–46.0)
Hemoglobin: 13.3 g/dL (ref 12.0–15.0)
Immature Granulocytes: 0 %
Lymphocytes Relative: 54 %
Lymphs Abs: 3.4 10*3/uL (ref 0.7–4.0)
MCH: 27.7 pg (ref 26.0–34.0)
MCHC: 32.7 g/dL (ref 30.0–36.0)
MCV: 84.8 fL (ref 80.0–100.0)
Monocytes Absolute: 0.5 10*3/uL (ref 0.1–1.0)
Monocytes Relative: 7 %
Neutro Abs: 2.4 10*3/uL (ref 1.7–7.7)
Neutrophils Relative %: 37 %
Platelets: 261 10*3/uL (ref 150–400)
RBC: 4.8 MIL/uL (ref 3.87–5.11)
RDW: 14.6 % (ref 11.5–15.5)
WBC: 6.4 10*3/uL (ref 4.0–10.5)
nRBC: 0 % (ref 0.0–0.2)

## 2020-08-09 LAB — HEPATIC FUNCTION PANEL
ALT: 22 U/L (ref 0–44)
AST: 23 U/L (ref 15–41)
Albumin: 3.7 g/dL (ref 3.5–5.0)
Alkaline Phosphatase: 89 U/L (ref 38–126)
Bilirubin, Direct: 0.1 mg/dL (ref 0.0–0.2)
Indirect Bilirubin: 0.3 mg/dL (ref 0.3–0.9)
Total Bilirubin: 0.4 mg/dL (ref 0.3–1.2)
Total Protein: 7.4 g/dL (ref 6.5–8.1)

## 2020-08-09 LAB — BASIC METABOLIC PANEL
Anion gap: 10 (ref 5–15)
BUN: 16 mg/dL (ref 8–23)
CO2: 23 mmol/L (ref 22–32)
Calcium: 9.4 mg/dL (ref 8.9–10.3)
Chloride: 105 mmol/L (ref 98–111)
Creatinine, Ser: 0.93 mg/dL (ref 0.44–1.00)
GFR, Estimated: >60 mL/min (ref >60)
Glucose, Bld: 82 mg/dL (ref 70–99)
Potassium: 4.2 mmol/L (ref 3.5–5.1)
Sodium: 138 mmol/L (ref 135–145)

## 2020-08-09 LAB — TROPONIN I (HIGH SENSITIVITY)
Troponin I (High Sensitivity): 19 ng/L — ABNORMAL HIGH (ref <18)
Troponin I (High Sensitivity): 18 ng/L — ABNORMAL HIGH (ref <18)

## 2020-08-09 LAB — LIPASE, BLOOD: Lipase: 25 U/L (ref 11–51)

## 2020-08-09 LAB — PROTIME-INR
INR: 1.4 — ABNORMAL HIGH (ref 0.8–1.2)
Prothrombin Time: 17.3 seconds — ABNORMAL HIGH (ref 11.4–15.2)

## 2020-08-09 LAB — MAGNESIUM: Magnesium: 2.1 mg/dL (ref 1.7–2.4)

## 2020-08-09 MED ORDER — METOPROLOL SUCCINATE ER 25 MG PO TB24: 50.0000 mg | ORAL_TABLET | Freq: Every day | ORAL | 0 refills | Status: DC

## 2020-08-09 MED ORDER — ALUM & MAG HYDROXIDE-SIMETH 200-200-20 MG/5ML PO SUSP
30.0000 mL | Freq: Once | ORAL | Status: AC
  Administered 2020-08-09: 30 mL via ORAL
  Filled 2020-08-09: qty 30

## 2020-08-09 NOTE — ED Provider Notes (Signed)
Emergency Medicine Provider Triage Evaluation Note  [name] , a 70 y.o. female  was evaluated in triage.  Pt complains of who presents with concern for palpitations and chest pain that started today.  Patient with history of severe expressive aphasia following CVA in the past therefore history was very difficult to obtain.  Per chart review she has a history of CHF, sickle cell, complete heart block with AV pacing.  Pain is 9/10 does not radiate.  Review of Systems  Positive: Chest pain, palpitations, shortness of breath, fatigue Negative: Lower extremity edema  Physical Exam  BP 111/61   Pulse 70   Temp (!) 97.5 F (36.4 C) (Oral)   Resp 16   SpO2 99%  Gen:   Awake, no distress   Resp:  Normal effort  MSK:   Moves extremities without difficulty  Other:  Well-healing midline sternotomy scar as well as scar in the right lower leg from for patient where they took vascular structures to repair her heart for bypass.  Unclear when surgery was, however surgical scars appear yellowed.  Medical Decision Making  Medically screening exam initiated at 5:47 PM.  Appropriate orders placed.  [name] was informed that the remainder of the evaluation will be completed by another provider, this initial triage assessment does not replace that evaluation, and the importance of remaining in the ED until their evaluation is complete.  This chart was dictated using voice recognition software, Dragon. Despite the best efforts of this provider to proofread and correct errors, errors may still occur which can change documentation meaning.    Aura Dials 08/09/20 1748    Tegeler, Gwenyth Allegra, MD 08/09/20 [phone]

## 2020-08-09 NOTE — Discharge Instructions (Addendum)
Try pepcid or tagamet up to twice a day.  Try to avoid things that may make this worse, most commonly these are spicy foods tomato based products fatty foods chocolate and peppermint.  Alcohol and tobacco can also make this worse.  Return to the emergency department for sudden worsening pain fever or inability to eat or drink.  

## 2020-08-09 NOTE — Progress Notes (Signed)
,   Electrophysiology Office Note   Date:  08/09/2020   ID:  [name], [name] 1950-08-09, MRN [id number]  PCP:  [name], MD  Cardiologist:  Oval [name] Primary Electrophysiologist:  Ivon Roedel Meredith Leeds, MD    Chief Complaint: heart block   History of Present Illness: [name] is a 70 y.o. female who is being seen today for the evaluation of heart block at the request of Just, [name], [place]. Presenting today for electrophysiology evaluation.  She has a history of hypertension, hyperlipidemia, stroke with expressive aphasia and right-sided deficit, sickle cell anemia, seizure disorder, heart failure.  She presented to the hospital April 2022 with seizure-like disorder found to be in complete heart block.  She required transcutaneous pacing.  She had a temporary wire implanted.  She has now status post Abbott CRT-P implanted 04/29/2020  Today, she denies symptoms of palpitations,  orthopnea, PND, lower extremity edema, claudication, dizziness, presyncope, syncope, bleeding, or neurologic sequela. The patient is tolerating medications without difficulties.  Her pacemaker is functioning appropriately.  Unfortunately, she has been having regular husband, 2 days of chest pain in the center of her chest and shortness of breath.  Unfortunately the patient is aphasic and thus no further history could be obtained.  It is unclear whether or not her chest pain is sensitive to palpation.  She is AV paced and thus ECG interpretation is not viable.   Past Medical History:  Diagnosis Date   Adenomatous polyp of colon 09/2012   repeat colonoscopy in 5 years by Dr. Sharlett Iles   CHF (congestive heart failure) ([place])    EF 15-20% as of 05/28/11 (Dr Legrand [facility] D[facility] summary)   CVA (cerebral infarction) 12/2007   H/O heart bypass surgery 2022   Hemiparesis ([place])    Hyperlipidemia    Hypertension    Seizures ([place])    Sickle cell anemia ([place])    Stroke ([place])    Vitamin D  deficiency 2010   Past Surgical History:  Procedure Laterality Date   ABDOMINAL HYSTERECTOMY     BIV PACEMAKER INSERTION CRT-P N/A 04/29/2020   Procedure: BIV PACEMAKER INSERTION CRT-P;  Surgeon: Constance Haw, MD;  Location: [facility];  Service: Cardiovascular;  Laterality: N/A;   CORONARY ARTERY BYPASS GRAFT N/A 04/25/2020   Procedure: CORONARY ARTERY BYPASS GRAFTING (CABG) TIMES TWO USING LEFT INTERNAL MAMMARY ARTERY AND RIGHT GREATER SAPHENOUS VEIN HARVESTED ENDOSCOPICALLY;  Surgeon: Melrose Nakayama, MD;  Location: [facility];  Service: Open Heart Surgery;  Laterality: N/A;   FRACTURE SURGERY     HIP SURGERY     LEFT HEART CATH AND CORONARY ANGIOGRAPHY N/A 04/22/2020   Procedure: LEFT HEART CATH AND CORONARY ANGIOGRAPHY;  Surgeon: Larey Dresser, MD;  Location: [facility];  Service: Cardiovascular;  Laterality: N/A;   TEE WITHOUT CARDIOVERSION  04/25/2020   Procedure: TRANSESOPHAGEAL ECHOCARDIOGRAM (TEE);  Surgeon: Melrose Nakayama, MD;  Location: [facility];  Service: Open Heart Surgery;;   TEMPORARY PACEMAKER N/A 04/21/2020   Procedure: TEMPORARY PACEMAKER;  Surgeon: Larey Dresser, MD;  Location: [facility];  Service: Cardiovascular;  Laterality: N/A;   TUBAL LIGATION       Current Outpatient Medications  Medication Sig Dispense Refill   acetaminophen (TYLENOL) 325 MG tablet Take 650 mg by mouth every 4 (four) hours as needed.     aspirin 81 MG chewable tablet Chew 1 tablet (81 mg total) by mouth daily. 30 tablet 6  atorvastatin (LIPITOR) 80 MG tablet Take 1 tablet (80 mg total) by mouth at bedtime. 90 tablet 1   Cholecalciferol 50 MCG (2000 UT) TABS Take 1 tablet by mouth daily in the afternoon.     empagliflozin (JARDIANCE) 10 MG TABS tablet Take 1 tablet (10 mg total) by mouth daily before breakfast. 30 tablet 5   furosemide (LASIX) 20 MG tablet Take 20 mg by mouth daily as needed.     Lacosamide 100 MG TABS Take 1 tablet (100 mg total) by mouth 2  (two) times daily. 60 tablet 5   levETIRAcetam (KEPPRA) 750 MG tablet Take 2 tablets (1,500 mg total) by mouth 2 (two) times daily. 360 tablet 4   metoprolol succinate (TOPROL XL) 25 MG 24 hr tablet Take 1 tablet (25 mg total) by mouth daily. 30 tablet 6   Nutritional Supplements (NUTRITIONAL SUPPLEMENT PO) Take 120 mLs by mouth 2 (two) times daily. Medpass for weight gain.     sacubitril-valsartan (ENTRESTO) 49-51 MG Take 1 tablet by mouth 2 (two) times daily. 180 tablet 3   Sodium Phosphates (RA SALINE ENEMA RE) Place rectally as needed.     spironolactone (ALDACTONE) 25 MG tablet Take 0.5 tablets (12.5 mg total) by mouth daily. 45 tablet 3   warfarin (COUMADIN) 4 MG tablet Take 4 mg by mouth every evening.     No current facility-administered medications for this visit.    Allergies:   Lexiscan [regadenoson]   Social History:  The patient  reports that she quit smoking about 12 years ago. Her smoking use included cigarettes. She has never used smokeless tobacco. She reports previous alcohol use. She reports that she does not use drugs.   Family History:  The patient's family history includes Asthma in her brother; Bronchitis in her brother; Colon cancer in her maternal grandfather; Diabetes in her daughter; Heart disease in her daughter; High Cholesterol in her daughter and sister; Hypertension in her daughter and sister.    ROS:  Please see the history of present illness.   Otherwise, review of systems is positive for none.   All other systems are reviewed and negative.    PHYSICAL EXAM: VS:  BP 108/60   Pulse 74   Ht 5\' 6"  (1.676 m)   Wt 120 lb (54.4 kg) Comment: per patient's husband. Pt in wheelchair  SpO2 94%   BMI 19.37 kg/m  , BMI Body mass index is 19.37 kg/m. GEN: Well nourished, well developed, in no acute distress  HEENT: normal  Neck: no JVD, carotid bruits, or masses Cardiac: RRR; no murmurs, rubs, or gallops,no edema  Respiratory:  clear to auscultation  bilaterally, normal work of breathing GI: soft, nontender, nondistended, + BS MS: no deformity or atrophy  Skin: warm and dry, device pocket is well healed Neuro:  Strength and sensation are intact Psych: euthymic mood, full affect  EKG:  EKG is ordered today. Personal review of the ekg ordered shows sinus rhythm, ventricular paced  Device interrogation is reviewed today in detail.  See PaceArt for details.   Recent Labs: 04/21/2020: ALT 15 04/26/2020: Magnesium 2.2 05/12/2020: Hemoglobin 10.6; Platelets 643 06/23/2020: BUN 10; Creatinine, Ser 0.92; Potassium 4.1; Sodium 139    Lipid Panel     Component Value Date/Time   CHOL 124 04/22/2020 0426   CHOL 120 10/08/2019 1031   TRIG 94 04/22/2020 0426   HDL 41 04/22/2020 0426   HDL 39 (L) 10/08/2019 1031   CHOLHDL 3.0 04/22/2020 0426   VLDL 19  04/22/2020 0426   LDLCALC 64 04/22/2020 0426   LDLCALC 61 10/08/2019 1031     Wt Readings from Last 3 Encounters:  08/09/20 120 lb (54.4 kg)  06/15/20 118 lb (53.5 kg)  06/08/20 118 lb (53.5 kg)      Other studies Reviewed: Additional studies/ records that were reviewed today include: TTE 04/29/20  Review of the above records today demonstrates:   1. Left ventricular ejection fraction, by estimation, is 30 to 35%. The  left ventricle has moderately decreased function. The left ventricle  demonstrates regional wall motion abnormalities (see scoring  diagram/findings for description). Inferior/lateral   akinesis. Left ventricular diastolic parameters are indeterminate.   2. Right ventricular systolic function is severely reduced. The right  ventricular size is normal. There is normal pulmonary artery systolic  pressure.   3. The mitral valve is normal in structure. Trivial mitral valve  regurgitation.   4. The aortic valve is tricuspid. Aortic valve regurgitation is moderate.  Mild aortic valve sclerosis is present, with no evidence of aortic valve  stenosis.    ASSESSMENT AND  PLAN:  1.  Complete heart block: Status post [facility] Jude CRT-P.  Device functioning appropriately.  No changes at this time.  2.  Chronic systolic heart failure due to ischemic cardiomyopathy: Currently on Entresto, Lasix, Aldactone, Toprol-XL.  Plan per heart failure cardiology.  3.  Coronary artery disease: Status post CABG.  Having ongoing chest pain.  Patient is going to the emergency room for further evaluation.  Case discussed with primary cardiology  Current medicines are reviewed at length with the patient today.   The patient does not have concerns regarding her medicines.  The following changes were made today:  none  Labs/ tests ordered today include:  Orders Placed This Encounter  Procedures   EKG 12-Lead     Disposition:   FU with Danzell Birky 9 months  Signed, Reonna Finlayson Meredith Leeds, MD  08/09/2020 4:57 PM     [facility] [place] [address] [phone] (office) [phone] (fax)

## 2020-08-09 NOTE — ED Provider Notes (Signed)
[facility] EMERGENCY DEPARTMENT Provider Note   CSN: [id number] Arrival date & time: 08/09/20  1659     History Chief Complaint  Patient presents with   Chest Pain    [name] is a 70 y.o. female.  70 yo F with a chief complaints of chest discomfort.  The patient was seen by her cardiologist today in the office and endorsed this.  She unfortunately has had a pretty severe stroke and so has difficulty communicating.  Unable to get a great history she was sent here for evaluation.  The husband states that she has been having pain off and on about every 4 hours seems to describe chest pain and difficulty breathing lasting for about 15 minutes at a time.  Always seems occur at rest though the patient does not exert herself much.  Denies cough congestion or fever denies diaphoresis nausea or vomiting.  Level 5 caveat aphasia.  The history is provided by the patient.  Chest Pain Pain location:  Unable to specify Associated symptoms: shortness of breath   Associated symptoms: no dizziness, no fever, no headache, no nausea, no palpitations and no vomiting       Past Medical History:  Diagnosis Date   Adenomatous polyp of colon 09/2012   repeat colonoscopy in 5 years by Dr. Sharlett Iles   CHF (congestive heart failure) ([place])    EF 15-20% as of 05/28/11 (Dr Legrand [facility] D[facility] summary)   CVA (cerebral infarction) 12/2007   H/O heart bypass surgery 2022   Hemiparesis ([place])    Hyperlipidemia    Hypertension    Seizures ([place])    Sickle cell anemia ([place])    Stroke ([place])    Vitamin D deficiency 2010    Patient Active Problem List   Diagnosis Date Noted   Neurocognitive deficits 05/26/2020   Coronary artery disease 05/10/2020   Acute blood loss anemia 05/10/2020   Third degree heart block ([place])    Complete heart block ([place]) 04/21/2020   Bleeding tendency ([place]) 12/14/2019   Encounter for monitoring digoxin therapy 04/09/2019   Status epilepticus  ([place])    Chronic combined systolic and diastolic heart failure ([place])    Demand ischemia ([place])    Acquired right foot drop 01/12/2017   Osteoporosis 12/03/2014   Vitamin D deficiency 12/19/2012   Dysarthria as late effect of cerebrovascular accident (CVA) 12/12/2012   Gait instability 12/12/2012   Contracture of joint 12/12/2012   Hemiplegia of dominant side as late effect following cerebrovascular disease ([place]) 12/12/2012   Anticoagulated on Coumadin 09/19/2011   Secondary cardiomyopathy ([place]) 08/24/2011   (L) MCA cardio embolic stroke  97/67/3419   H/O tobacco use 06/25/2011   Weakness 05/28/2011   History of CVA (cerebrovascular accident) 05/28/2011   HTN (hypertension) 05/28/2011   HLD (hyperlipidemia) 05/28/2011   Seizure disorder ([place]) 05/28/2011    Past Surgical History:  Procedure Laterality Date   ABDOMINAL HYSTERECTOMY     BIV PACEMAKER INSERTION CRT-P N/A 04/29/2020   Procedure: BIV PACEMAKER INSERTION CRT-P;  Surgeon: Constance Haw, MD;  Location: [place];  Service: Cardiovascular;  Laterality: N/A;   CORONARY ARTERY BYPASS GRAFT N/A 04/25/2020   Procedure: CORONARY ARTERY BYPASS GRAFTING (CABG) TIMES TWO USING LEFT INTERNAL MAMMARY ARTERY AND RIGHT GREATER SAPHENOUS VEIN HARVESTED ENDOSCOPICALLY;  Surgeon: Melrose Nakayama, MD;  Location: [place];  Service: Open Heart Surgery;  Laterality: N/A;   FRACTURE SURGERY     HIP SURGERY  LEFT HEART CATH AND CORONARY ANGIOGRAPHY N/A 04/22/2020   Procedure: LEFT HEART CATH AND CORONARY ANGIOGRAPHY;  Surgeon: Larey Dresser, MD;  Location: [place] [facility];  Service: Cardiovascular;  Laterality: N/A;   TEE WITHOUT CARDIOVERSION  04/25/2020   Procedure: TRANSESOPHAGEAL ECHOCARDIOGRAM (TEE);  Surgeon: Melrose Nakayama, MD;  Location: [facility] OR;  Service: Open Heart Surgery;;   TEMPORARY PACEMAKER N/A 04/21/2020   Procedure: TEMPORARY PACEMAKER;  Surgeon: Larey Dresser, MD;  Location: [place] [facility];   Service: Cardiovascular;  Laterality: N/A;   TUBAL LIGATION       OB History   No obstetric history on file.     Family History  Problem Relation Age of Onset   Diabetes Daughter    High Cholesterol Daughter    Hypertension Daughter    Heart disease Daughter    Hypertension Sister    High Cholesterol Sister    Asthma Brother    Bronchitis Brother    Colon cancer Maternal Grandfather     Social History   Tobacco Use   Smoking status: Former    Types: Cigarettes    Quit date: 09/26/2007    Years since quitting: 12.8   Smokeless tobacco: Never  Vaping Use   Vaping Use: Never used  Substance Use Topics   Alcohol use: Not Currently    Comment: Former drinker, 15 years ago   Drug use: Never    Home Medications Prior to Admission medications   Medication Sig Start Date End Date Taking? Authorizing Provider  acetaminophen (TYLENOL) 325 MG tablet Take 650 mg by mouth every 4 (four) hours as needed.    [provider]  aspirin 81 MG chewable tablet Chew 1 tablet (81 mg total) by mouth daily. 05/31/20   Melrose Nakayama, MD  atorvastatin (LIPITOR) 80 MG tablet Take 1 tablet (80 mg total) by mouth at bedtime. 08/08/20   [name], MD  Cholecalciferol 50 MCG (2000 UT) TABS Take 1 tablet by mouth daily in the afternoon.    [provider]  empagliflozin (JARDIANCE) 10 MG TABS tablet Take 1 tablet (10 mg total) by mouth daily before breakfast. 07/21/20   Bensimhon, Shaune Pascal, MD  furosemide (LASIX) 20 MG tablet Take 20 mg by mouth daily as needed.    [provider]  Lacosamide 100 MG TABS Take 1 tablet (100 mg total) by mouth 2 (two) times daily. 07/06/20   Marcial Pacas, MD  levETIRAcetam (KEPPRA) 750 MG tablet Take 2 tablets (1,500 mg total) by mouth 2 (two) times daily. 12/30/19 03/24/21  Penumalli, Earlean Polka, MD  metoprolol succinate (TOPROL XL) 25 MG 24 hr tablet Take 2 tablets (50 mg total) by mouth daily. 08/09/20   Deno Etienne, DO  Nutritional  Supplements (NUTRITIONAL SUPPLEMENT PO) Take 120 mLs by mouth 2 (two) times daily. Medpass for weight gain.    [provider]  sacubitril-valsartan (ENTRESTO) 49-51 MG Take 1 tablet by mouth 2 (two) times daily. 06/15/20   Lyda Jester M, PA-C  Sodium Phosphates (RA SALINE ENEMA RE) Place rectally as needed.    [provider]  spironolactone (ALDACTONE) 25 MG tablet Take 0.5 tablets (12.5 mg total) by mouth daily. 06/08/20   Almyra Deforest, PA  warfarin (COUMADIN) 4 MG tablet Take 4 mg by mouth every evening.    [provider]    [place] [regadenoson]  Review of Systems   Review of Systems  Unable to perform ROS: Patient  nonverbal  Constitutional:  Negative for chills and fever.  HENT:  Negative for congestion and rhinorrhea.   Eyes:  Negative for redness and visual disturbance.  Respiratory:  Positive for shortness of breath. Negative for wheezing.   Cardiovascular:  Positive for chest pain. Negative for palpitations.  Gastrointestinal:  Negative for nausea and vomiting.  Genitourinary:  Negative for dysuria and urgency.  Musculoskeletal:  Negative for arthralgias and myalgias.  Skin:  Negative for pallor and wound.  Neurological:  Negative for dizziness and headaches.   Physical Exam Updated Vital Signs BP 108/61   Pulse 66   Temp (!) 97.5 F (36.4 C) (Oral)   Resp 16   SpO2 100%   Physical Exam Vitals and nursing note reviewed.  Constitutional:      General: She is not in acute distress.    Appearance: She is well-developed. She is not diaphoretic.  HENT:     Head: Normocephalic and atraumatic.  Eyes:     Pupils: Pupils are equal, round, and reactive to light.  Cardiovascular:     Rate and Rhythm: Normal rate and regular rhythm.     Heart sounds: No murmur heard.   No friction rub. No gallop.  Pulmonary:     Effort: Pulmonary effort is normal.     Breath sounds: No wheezing or rales.  Chest:     Comments: No obvious  recent producible tenderness on palpation of the chest wall the patient does not yes when asked if it hurts when I push there. Abdominal:     General: There is no distension.     Palpations: Abdomen is soft.     Tenderness: There is no abdominal tenderness.  Musculoskeletal:        General: No tenderness.     Cervical back: Normal range of motion and neck supple.  Skin:    General: Skin is warm and dry.  Neurological:     Mental Status: She is alert and oriented to person, place, and time.  Psychiatric:        Behavior: Behavior normal.    ED Results / Procedures / Treatments   Labs (all labs ordered are listed, but only abnormal results are displayed) Labs Reviewed  PROTIME-INR - Abnormal; Notable for the following components:      Result Value   Prothrombin Time 17.3 (*)    INR 1.4 (*)    All other components within normal limits  TROPONIN I (HIGH SENSITIVITY) - Abnormal; Notable for the following components:   Troponin I (High Sensitivity) 19 (*)    All other components within normal limits  TROPONIN I (HIGH SENSITIVITY) - Abnormal; Notable for the following components:   Troponin I (High Sensitivity) 18 (*)    All other components within normal limits  BASIC METABOLIC PANEL  CBC WITH DIFFERENTIAL/PLATELET  MAGNESIUM  HEPATIC FUNCTION PANEL  LIPASE, BLOOD    EKG EKG Interpretation  Date/Time:  Tuesday August 09 2020 17:20:43 EDT Ventricular Rate:  72 PR Interval:  154 QRS Duration: 128 QT Interval:  460 QTC Calculation: 503 R Axis:   -59 Text Interpretation: Atrial-sensed ventricular-paced rhythm Biventricular pacemaker detected Abnormal ECG No significant change since last tracing Confirmed by Deno Etienne [id number]) on 08/09/2020 9:23:53 PM  Radiology DG Chest 2 View  Result Date: 08/09/2020 CLINICAL DATA:  Palpitations, chest pain, history of CVA EXAM: CHEST - 2 VIEW COMPARISON:  05/31/2020 FINDINGS: Frontal and lateral views of the chest demonstrate stable multi  lead pacer. Postsurgical changes from  prior median sternotomy. Cardiac silhouette is unremarkable. No airspace disease, effusion, or pneumothorax. No acute bony abnormalities. IMPRESSION: 1. No acute intrathoracic process. Electronically Signed   By: Randa Ngo M.D.   On: 08/09/2020 18:36    Procedures Procedures   Medications Ordered in ED Medications  alum & mag hydroxide-simeth (MAALOX/MYLANTA) 200-200-20 MG/5ML suspension 30 mL (30 mLs Oral Given 08/09/20 2200)    ED Course  I have reviewed the triage vital signs and the nursing notes.  Pertinent labs & imaging results that were available during my care of the patient were reviewed by me and considered in my medical decision making (see chart for details).    MDM Rules/Calculators/A&P                           70 yo F with a chief complaint of chest pain off and on for the past 48 hours.  Seems to come and go.  Worsening today per the family.  Does not seem to be related to food.  Not exertional.  Last for about 15 minutes at a time.  Complicated by severe aphasia and difficulty to obtain an accurate history.  Was seen today by her cardiologist and sent here for evaluation.  Initial troponin is elevated but just mildly so.  Seems much less than her prior tests.  Will obtain a delta troponin.  Healthy and lipase is history is difficult to obtain.  Discussed with cardiology. Dr. Clayton Bibles, felt this was unlikely to be episodes of diaphragmatic pacing, felt if the second troponin was unchanged and would be reasonable to discharge and could theoretically increase her metoprolol.  Patient second troponin is essentially unchanged.  LFTs and lipase are unremarkable.  Reassessment the patient continues to be asymptomatic.  Will have her start H2 blockers for possible reflux.  We will increase the metoprolol as suggested.  We will have her call her cardiologist tomorrow and let them know how she is doing.  10:34 PM:  I have discussed the  diagnosis/risks/treatment options with the patient and family and believe the pt to be eligible for discharge home to follow-up with Cards. We also discussed returning to the ED immediately if new or worsening sx occur. We discussed the sx which are most concerning (e.g., sudden worsening pain, fever, inability to tolerate by mouth) that necessitate immediate return. Medications administered to the patient during their visit and any new prescriptions provided to the patient are listed below.  Medications given during this visit Medications  alum & mag hydroxide-simeth (MAALOX/MYLANTA) 200-200-20 MG/5ML suspension 30 mL (30 mLs Oral Given 08/09/20 2200)     The patient appears reasonably screen and/or stabilized for discharge and I doubt any other medical condition or other [facility] requiring further screening, evaluation, or treatment in the ED at this time prior to discharge.   Final Clinical Impression(s) / ED Diagnoses Final diagnoses:  Nonspecific chest pain    Rx / DC Orders ED Discharge Orders          Ordered    metoprolol succinate (TOPROL XL) 25 MG 24 hr tablet  Daily        08/09/20 2232             Deno Etienne, DO 08/09/20 2234

## 2020-08-09 NOTE — ED Triage Notes (Signed)
Pt c/o CP x1hr, hx HTN, HLD, bypass sx, CHF, CVA, sickle cell, pacemaker. Denies nausea, states she feels heart is pounding, feels fatigued. 9/10 central CP  Dysarthria noted in triage but able to communicate

## 2020-08-14 NOTE — Progress Notes (Signed)
Advanced [facility] Failure Clinic Progress Note    PCP: [name], MD HF Cardiology: Dr. Aundra Dubin   70 y.o. with history of CVA, chronic systolic CHF, CHB, and CAD returns for followup of CHF.   She had a stroke in 2009, thought to be cardioembolic.  She has had right hemiparesis and aphasia since that time.   [name] was hospitalized in August 2020 with a seizure after being out of her medications for 4 days.  She required intubation.  High-sensitivity troponin trended to 1905. An echocardiogram 08/2018 revealed EF of 35 to 40% with global hypokinesis and moderate LVH, elevated LVEDP.  She also had moderate AR. Nuclear stress test in August 2020 showed a large severe fixed inferior, anterior apical, and inferolateral perfusion defect suggestive of scar without ischemia.  Study was high risk due to EF estimated at 34% but mentions no significant reversible ischemia. She was deemed not a good candidate for cardiac catheterization during that hospitalization. She has followed with Dr. Oval [name] and was last seen in November 2021.    Patient was admitted in 3/22 following a syncopal episode and was found to be in complete heart block.  This improved initially after stopping nodal blockers.  Echo in 3/22 showed EF 35-40%.  LHC showed significant left main and LCx disease.  She was deemed to be a poor candidate for PCI.  In 4/22, she had CABG with LIMA-LAD and SVG-OM2.  Post-op, she developed CHB again.  St Jude CRT-P device was placed. She was discharged to a rehab facility.   Had post hospital f/u on 4/22. At the time, volume status was stable and w/ NYHA class II symptoms. Toprol was increased to 25 mg daily. No other med changes. Instructed to f/u in 4 weeks.   She returned 5/22 for f/u. Was released from SNF 2 weeks ago. Back at home and getting home health PT but husband reports she has not been very cooperative w/ PT. Reds was 23%, Entresto was increased.   She had f/u EP appointment  08/09/20 and was sent to the ED for evaluation of new CP & SOB. Troponin and delta troponin were reassuring. Per cardiology, unlikely to be episodes of diaphragmatic pacing. She was started on H2 blocker and her metoprolol was increased.   Today she returns for HF follow up. Here with her husband. Overall weak, however able to pull herself up out of wheelchair. Still primarily wheelchair dependent. Able to make short transfers w/ walker, but unable to stand/walk for long periods of time. Has slight dyspnea w/ transfers. Denies increasing SOB, CP, dizziness, edema, or PND/Orthopnea. Appetite fair, drinking nutritional supplements. No fever or chills. Unsure of wt gain, pt unable to stand for wt today (charted wt was self reported). Taking all medications, had not needed any lasix.   ECG (personally reviewed): SR V-paced   Device Interrogation (personally reviewed): Stable thoracic impedence, no VT/VF  Labs (4/22): K 3.9, creatinine 0.83, hgb 9.4. digoxin level <0.2  Labs (7/22): K 4.2, creatinine 0.93, hgb 13.3  PMH: 1. H/o seizure disorder.  2. CVA: 2009 with aphasia and right hemiparesis.  3. Complete heart block: St Jude CRT-P. 4. Chronic systolic CHF: Ischemic cardiomyopathy. St Jude CRT-P device.  - Echo (3/22): EF 35-40%, inferior and lateral HK, normal RV.  - Coronary angiography (4/22): 60-70% dLM, 60-70% ostial LCx, 99% proximal calcified LCx (LCx was large, dominant vessel).  5. CAD:  Coronary angiography (4/22) with 60-70% dLM, 60-70% ostial LCx,  99% proximal calcified LCx (LCx was large, dominant vessel). - CABG x 4 in 4/22: LIMA-LAD, SVG-OM2.  6. CHB: St Jude CRT-P placed in 4/22.   Social History   Socioeconomic History   Marital status: Married    Spouse name: [name]   Number of children: 2   Years of education: 12   Highest education level: Not on file  Occupational History    Employer: RETIRED  Tobacco Use   Smoking status: Former    Types: Cigarettes    Quit date:  09/26/2007    Years since quitting: 12.8   Smokeless tobacco: Never  Vaping Use   Vaping Use: Never used  Substance and Sexual Activity   Alcohol use: Not Currently    Comment: Former drinker, 15 years ago   Drug use: Never   Sexual activity: Not Currently  Other Topics Concern   Not on file  Social History Narrative   Tobacco use, amount per day now: None.   Past tobacco use, amount per day: Everyday.   How many years did you use tobacco: 30 years.   Alcohol use (drinks per week): Wine Daily   Diet:   Do you drink/eat things with caffeine: Yes   Marital status: Married                                  What year were you married?    Do you live in a house, apartment, assisted living, condo, trailer, etc.? House   Is it one or more stories? 1 story.   How many persons live in your home? 2   Do you have pets in your home?( please list) No   Highest Level of education completed? Associate   Current or past profession: Director    Do you exercise?    No                             Type and how often?   Do you have a living will?    Do you have a DNR form?                                   If not, do you want to discuss one?   Do you have signed POA/HPOA forms?                        If so, please bring to you appointment      Do you have any difficulty bathing or dressing yourself? Yes   Do you have any difficulty preparing food or eating? Yes   Do you have any difficulty managing your medications? Yes   Do you have any difficulty managing your finances? Yes   Do you have any difficulty affording your medications? Yes   Social Determinants of Health   Financial Resource Strain: Not on file  Food Insecurity: Not on file  Transportation Needs: Not on file  Physical Activity: Not on file  Stress: Not on file  Social Connections: Not on file  Intimate Partner Violence: Not on file   Family History  Problem Relation Age of Onset   Diabetes Daughter    High Cholesterol Daughter     Hypertension Daughter    Heart disease Daughter    Hypertension Sister  High Cholesterol Sister    Asthma Brother    Bronchitis Brother    Colon cancer Maternal Grandfather    ROS: All systems reviewed and negative except as per HPI.   Current Outpatient Medications  Medication Sig Dispense Refill   acetaminophen (TYLENOL) 325 MG tablet Take 650 mg by mouth every 4 (four) hours as needed.     aspirin 81 MG chewable tablet Chew 1 tablet (81 mg total) by mouth daily. 30 tablet 6   atorvastatin (LIPITOR) 80 MG tablet Take 1 tablet (80 mg total) by mouth at bedtime. 90 tablet 1   Cholecalciferol 50 MCG (2000 UT) TABS Take 1 tablet by mouth daily in the afternoon.     empagliflozin (JARDIANCE) 10 MG TABS tablet Take 1 tablet (10 mg total) by mouth daily before breakfast. 30 tablet 5   furosemide (LASIX) 20 MG tablet Take 20 mg by mouth daily as needed.     Lacosamide 100 MG TABS Take 1 tablet (100 mg total) by mouth 2 (two) times daily. 60 tablet 5   levETIRAcetam (KEPPRA) 750 MG tablet Take 2 tablets (1,500 mg total) by mouth 2 (two) times daily. 360 tablet 4   metoprolol succinate (TOPROL XL) 25 MG 24 hr tablet Take 2 tablets (50 mg total) by mouth daily. 30 tablet 0   Nutritional Supplements (NUTRITIONAL SUPPLEMENT PO) Take 120 mLs by mouth once. Medpass for weight gain.     sacubitril-valsartan (ENTRESTO) 49-51 MG Take 1 tablet by mouth 2 (two) times daily. 180 tablet 3   spironolactone (ALDACTONE) 25 MG tablet Take 0.5 tablets (12.5 mg total) by mouth daily. 45 tablet 3   warfarin (COUMADIN) 4 MG tablet Take 4 mg by mouth every evening.     No current facility-administered medications for this encounter.   BP 120/70   Pulse 60   SpO2 98%    PHYSICAL EXAM: General:  NAD. No resp difficulty, chronically-ill appearing, arrived in [facility], cachectic  HEENT: Normal Neck: Supple. No JVD. Carotids 2+ bilat; no bruits. No lymphadenopathy or thryomegaly appreciated. Cor: PMI nondisplaced.  Regular rate & rhythm. No rubs, gallops or murmurs. Lungs: Clear Abdomen: Soft, nontender, nondistended. No hepatosplenomegaly. No bruits or masses. Good bowel sounds. Extremities: No cyanosis, clubbing, rash, edema, + right wrist splint. Neuro: Alert, aphasic, + right hemiparesis. Pleasant affect.  Asssessment/Plan: 1. Complete heart block: Patient had baseline NSR with LAFB/RBBB, so there was underlying conduction system disease.  She was transiently in CHB pre-CABG and developed persistent CHB post-CABG.  She now has [employer] CRT-P device.   2. Chronic systolic CHF: Ischemic cardiomyopathy.  Echo in 8/20 with EF 35-40% and wall motion abnormalities. LHC in 4/22 showed left main/severe codominant LCx disease.  Echo in 3/22 with EF 35-40%, inferior and lateral hypokinesis.  Now s/p CABG. Unable to assess functional class as pt is primarily wheelchair dependent. Denies resting dyspnea, likely NYHA II. Euvolemic on exam and by device interrogation. - Continue Entresto 49-51 mg bid. - Continue Lasix 20 mg PRN.    - Continue spironolactone 12.5 mg daily.  - Continue Jardiance 10 mg daily. Husband says she is continent. No GU symptoms. - Continue Toprol XL 50 mg daily.  - She is at risk for dehydration with poor oral intake. Will not increase medications today. - BMET 08/09/20 ok. 3. 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  Has been on warfarin.  Has had long-standing aphasia and right hemiparesis.    - Continue warfarin.  INR followed  at [facility] [address]. INR 1.4 08/09/20. - Will send copy of INR to PCP. - Continue w/ home PT.  4. H/o seizure disorder: Continue Keppra.  5. CAD:  LHC showed 60-70% distal left main stenosis, heavily calcified proximal LCx with 60-70% stenosis at the ostium and discrete 99% stenosis in the proximal LCx.  Suspect that her CHB is related to chronic ischemia from LM-LCx disease worsened by nodal blockade. Had CABG x 2 4/22 w/ LIMA-LAD, SVG-OM2.  - Stable  w/o CP currently.  - Continue ASA 81 daily.  - Continue statin.  6. Dental caries: Husband says she needs several teeth extracted under general anesthesia. He is questioning whether this is safe in her current condition. - Advised waiting until repeat echo and follow up with Dr. Aundra Dubin can then advise cardiac surgical risk.  F/u in 1-2 months with Dr. Aundra Dubin + echo  [place], [place] 08/15/2020

## 2020-08-15 ENCOUNTER — Ambulatory Visit (HOSPITAL_COMMUNITY)
Admission: RE | Admit: 2020-08-15 | Discharge: 2020-08-15 | Disposition: A | Discharge status: Home / Self Care | Payer: Medicare HMO | Source: Ambulatory Visit | Attending: Family Medicine | Admitting: Family Medicine

## 2020-08-15 ENCOUNTER — Encounter (HOSPITAL_COMMUNITY): Payer: Self-pay

## 2020-08-15 ENCOUNTER — Other Ambulatory Visit: Payer: Self-pay

## 2020-08-15 VITALS — BP 120/70 | HR 60

## 2020-08-15 DIAGNOSIS — G40909 Epilepsy, unspecified, not intractable, without status epilepticus: Secondary | ICD-10-CM | POA: Diagnosis not present

## 2020-08-15 DIAGNOSIS — K029 Dental caries, unspecified: Secondary | ICD-10-CM | POA: Diagnosis not present

## 2020-08-15 DIAGNOSIS — Z8249 Family history of ischemic heart disease and other diseases of the circulatory system: Secondary | ICD-10-CM | POA: Diagnosis not present

## 2020-08-15 DIAGNOSIS — I442 Atrioventricular block, complete: Secondary | ICD-10-CM | POA: Insufficient documentation

## 2020-08-15 DIAGNOSIS — R531 Weakness: Secondary | ICD-10-CM | POA: Diagnosis not present

## 2020-08-15 DIAGNOSIS — I255 Ischemic cardiomyopathy: Secondary | ICD-10-CM | POA: Diagnosis not present

## 2020-08-15 DIAGNOSIS — Z951 Presence of aortocoronary bypass graft: Secondary | ICD-10-CM | POA: Diagnosis not present

## 2020-08-15 DIAGNOSIS — I5022 Chronic systolic (congestive) heart failure: Secondary | ICD-10-CM | POA: Diagnosis not present

## 2020-08-15 DIAGNOSIS — I2510 Atherosclerotic heart disease of native coronary artery without angina pectoris: Secondary | ICD-10-CM | POA: Diagnosis not present

## 2020-08-15 DIAGNOSIS — Z7901 Long term (current) use of anticoagulants: Secondary | ICD-10-CM | POA: Insufficient documentation

## 2020-08-15 DIAGNOSIS — I5042 Chronic combined systolic (congestive) and diastolic (congestive) heart failure: Secondary | ICD-10-CM

## 2020-08-15 DIAGNOSIS — Z79899 Other long term (current) drug therapy: Secondary | ICD-10-CM | POA: Diagnosis not present

## 2020-08-15 DIAGNOSIS — Z87891 Personal history of nicotine dependence: Secondary | ICD-10-CM | POA: Diagnosis not present

## 2020-08-15 DIAGNOSIS — Z8673 Personal history of transient ischemic attack (TIA), and cerebral infarction without residual deficits: Secondary | ICD-10-CM | POA: Diagnosis not present

## 2020-08-15 DIAGNOSIS — Z7982 Long term (current) use of aspirin: Secondary | ICD-10-CM | POA: Diagnosis not present

## 2020-08-15 NOTE — Patient Instructions (Signed)
It was great to see you today! No medication changes are needed at this time.  Your physician has requested that you have an echocardiogram. Echocardiography is a painless test that uses sound waves to create images of your heart. It provides your doctor with information about the size and shape of your heart and how well your heart's chambers and valves are working. This procedure takes approximately one hour. There are no restrictions for this procedure.  Your physician recommends that you schedule a follow-up appointment in: 2 months with Dr Aundra Dubin and echo  Do the following things EVERYDAY: Weigh yourself in the morning before breakfast. Write it down and keep it in a log. Take your medicines as prescribed Eat low salt foods--Limit salt (sodium) to 2000 mg per day.  Stay as active as you can everyday Limit all fluids for the day to less than 2 liters  milAt the [facility], you and your health needs are our priority. As part of our continuing mission to provide you with exceptional heart care, we have created designated Provider Care Teams. These Care Teams include your primary Cardiologist (physician) and Advanced Practice Providers (APPs- Physician Assistants and Nurse Practitioners) who all work together to provide you with the care you need, when you need it.   You may see any of the following providers on your designated Care Team at your next follow up: Dr Glori Bickers Dr Loralie Champagne Dr Patrice Paradise, NP Lyda Jester, [place] Ginnie Smart Audry Riles, PharmD   Please be sure to bring in all your medications bottles to every appointment.   If you have any questions or concerns before your next appointment please send [place] a message through [place] or call our office at [phone].    TO LEAVE A MESSAGE FOR THE NURSE SELECT OPTION 2, PLEASE LEAVE A MESSAGE INCLUDING: YOUR NAME DATE OF BIRTH CALL BACK NUMBER REASON FOR CALL**this is  important as we prioritize the call backs  YOU WILL RECEIVE A CALL BACK THE SAME DAY AS LONG AS YOU CALL BEFORE 4:00 PM

## 2020-08-24 NOTE — Progress Notes (Signed)
Remote pacemaker transmission.   

## 2020-08-25 ENCOUNTER — Other Ambulatory Visit: Payer: Self-pay

## 2020-08-25 ENCOUNTER — Ambulatory Visit (HOSPITAL_BASED_OUTPATIENT_CLINIC_OR_DEPARTMENT_OTHER): Payer: Medicare HMO | Admitting: Cardiology

## 2020-08-25 ENCOUNTER — Encounter (HOSPITAL_BASED_OUTPATIENT_CLINIC_OR_DEPARTMENT_OTHER): Payer: Self-pay | Admitting: Cardiology

## 2020-08-25 VITALS — BP 100/40 | HR 67 | Ht 66.0 in | Wt 120.0 lb

## 2020-08-25 DIAGNOSIS — I442 Atrioventricular block, complete: Secondary | ICD-10-CM | POA: Diagnosis not present

## 2020-08-25 DIAGNOSIS — I5042 Chronic combined systolic (congestive) and diastolic (congestive) heart failure: Secondary | ICD-10-CM | POA: Diagnosis not present

## 2020-08-25 MED ORDER — METOPROLOL SUCCINATE ER 50 MG PO TB24: 50.0000 mg | ORAL_TABLET | Freq: Every day | ORAL | 3 refills | Status: DC

## 2020-08-25 NOTE — Patient Instructions (Signed)
Medication Instructions:  Please increase Metoprolol to 50 mg a day. Continue all other medications as listed.  *If you need a refill on your cardiac medications before your next appointment, please call your pharmacy*  Follow-Up: At [facility], you and your health needs are our priority.  As part of our continuing mission to provide you with exceptional heart care, we have created designated Provider Care Teams.  These Care Teams include your primary Cardiologist (physician) and Advanced Practice Providers (APPs -  Physician Assistants and Nurse Practitioners) who all work together to provide you with the care you need, when you need it.  We recommend signing up for the patient portal called "MyChart".  Sign up information is provided on this After Visit Summary.  MyChart is used to connect with patients for Virtual Visits (Telemedicine).  Patients are able to view lab/test results, encounter notes, upcoming appointments, etc.  Non-urgent messages can be sent to your provider as well.   To learn more about what you can do with MyChart, go to [email or web address].    Your next appointment:   6 month(s)  The format for your next appointment:   In Person  Provider:   Candee Furbish, MD   Thank you for choosing [facility]!!

## 2020-08-25 NOTE — Progress Notes (Signed)
Cardiology Office Note:    Date:  08/25/2020   ID:  [name], [name] 04/18/1950, MRN [id number]  PCP:  [name], MD   [facility] Providers Cardiologist:  Candee Furbish, MD Electrophysiologist:  Will Meredith Leeds, MD     Referring MD: No ref. provider found    History of Present Illness:    [name] is a 70 y.o. female patient seen by Dr. Aundra Dubin in advanced heart failure clinic with history of stroke chronic systolic heart failure complete heart block CAD new to me today in general cardiology.  Stroke was in 2009 thought to be cardioembolic as right hemiparesis and aphasia since that time.  Back in March or 2022 she was hospitalized with syncopal episode and found to be in complete heart block.  She ended up having CABG with LIMA to LAD SVG to obtuse marginal 2 and postop developed complete heart block again and underwent [facility] Jude CRT-P device.  She was discharged to a rehab facility.  NYHA class II symptoms.  On Entresto.  She had a follow-up EP appointment on 08/09/2020 and at that time was sent to the ED for evaluation of chest pain and shortness of breath.  Troponins were reassuring.  EP did not think it was episodes of diaphragmatic pacing.  She was started on H2 blocker and her metoprolol was increased.  2 weeks ago in the heart failure clinic she felt overall weak but she was able to pull her self up out of the wheelchair.  Short transfers noted.  She was taking all of her medications and has not needed any Lasix.  Her thoracic impedance was stable.  No VT no VF.  PMH: 1. H/o seizure disorder. 2. CVA: 2009 with aphasia and right hemiparesis. 3. Complete heart block: St Jude CRT-P. 4. Chronic systolic CHF: Ischemic cardiomyopathy. St Jude CRT-P device. - Echo (3/22): EF 35-40%, inferior and lateral HK, normal RV. - Coronary angiography (4/22): 60-70% dLM, 60-70% ostial LCx, 99% proximal calcified LCx (LCx was large, dominant vessel). 5. CAD:   Coronary angiography (4/22) with 60-70% dLM, 60-70% ostial LCx, 99% proximal calcified LCx (LCx was large, dominant vessel). - CABG x 4 in 4/22: LIMA-LAD, SVG-OM2. 6. CHB: St Jude CRT-P placed in 4/22.   Past Medical History:  Diagnosis Date   Adenomatous polyp of colon 09/2012   repeat colonoscopy in 5 years by Dr. Sharlett Iles   CHF (congestive heart failure) ([place])    EF 15-20% as of 05/28/11 (Dr Legrand [facility]o [facility] summary)   CVA (cerebral infarction) 12/2007   H/O heart bypass surgery 2022   Hemiparesis ([place])    Hyperlipidemia    Hypertension    Seizures ([place])    Sickle cell anemia ([place])    Stroke ([place])    Vitamin D deficiency 2010    Past Surgical History:  Procedure Laterality Date   ABDOMINAL HYSTERECTOMY     BIV PACEMAKER INSERTION CRT-P N/A 04/29/2020   Procedure: BIV PACEMAKER INSERTION CRT-P;  Surgeon: Constance Haw, MD;  Location: [place] [facility];  Service: Cardiovascular;  Laterality: N/A;   CORONARY ARTERY BYPASS GRAFT N/A 04/25/2020   Procedure: CORONARY ARTERY BYPASS GRAFTING (CABG) TIMES TWO USING LEFT INTERNAL MAMMARY ARTERY AND RIGHT GREATER SAPHENOUS VEIN HARVESTED ENDOSCOPICALLY;  Surgeon: Melrose Nakayama, MD;  Location: [place];  Service: Open Heart Surgery;  Laterality: N/A;   FRACTURE SURGERY     HIP SURGERY     LEFT HEART CATH AND CORONARY ANGIOGRAPHY N/A  04/22/2020   Procedure: LEFT HEART CATH AND CORONARY ANGIOGRAPHY;  Surgeon: Larey Dresser, MD;  Location: [place] [facility];  Service: Cardiovascular;  Laterality: N/A;   TEE WITHOUT CARDIOVERSION  04/25/2020   Procedure: TRANSESOPHAGEAL ECHOCARDIOGRAM (TEE);  Surgeon: Melrose Nakayama, MD;  Location: [facility] OR;  Service: Open Heart Surgery;;   TEMPORARY PACEMAKER N/A 04/21/2020   Procedure: TEMPORARY PACEMAKER;  Surgeon: Larey Dresser, MD;  Location: [place] [facility];  Service: Cardiovascular;  Laterality: N/A;   TUBAL LIGATION      Current Medications: Current Meds  Medication Sig    acetaminophen (TYLENOL) 325 MG tablet Take 650 mg by mouth every 4 (four) hours as needed.   aspirin 81 MG chewable tablet Chew 1 tablet (81 mg total) by mouth daily.   atorvastatin (LIPITOR) 80 MG tablet Take 1 tablet (80 mg total) by mouth at bedtime.   Cholecalciferol 50 MCG (2000 UT) TABS Take 1 tablet by mouth daily in the afternoon.   empagliflozin (JARDIANCE) 10 MG TABS tablet Take 1 tablet (10 mg total) by mouth daily before breakfast.   furosemide (LASIX) 20 MG tablet Take 20 mg by mouth daily as needed.   Lacosamide 100 MG TABS Take 1 tablet (100 mg total) by mouth 2 (two) times daily.   levETIRAcetam (KEPPRA) 750 MG tablet Take 2 tablets (1,500 mg total) by mouth 2 (two) times daily.   metoprolol succinate (TOPROL-XL) 50 MG 24 hr tablet Take 1 tablet (50 mg total) by mouth daily. Take with or immediately following a meal.   Nutritional Supplements (NUTRITIONAL SUPPLEMENT PO) Take 120 mLs by mouth once. Medpass for weight gain.   sacubitril-valsartan (ENTRESTO) 49-51 MG Take 1 tablet by mouth 2 (two) times daily.   spironolactone (ALDACTONE) 25 MG tablet Take 0.5 tablets (12.5 mg total) by mouth daily.   warfarin (COUMADIN) 4 MG tablet Take 4 mg by mouth every evening.   [DISCONTINUED] metoprolol succinate (TOPROL XL) 25 MG 24 hr tablet Take 2 tablets (50 mg total) by mouth daily.     Allergies:   Lexiscan [regadenoson]   Social History   Socioeconomic History   Marital status: Married    Spouse name: [name]   Number of children: 2   Years of education: 12   Highest education level: Not on file  Occupational History    Employer: RETIRED  Tobacco Use   Smoking status: Former    Types: Cigarettes    Quit date: 09/26/2007    Years since quitting: 12.9   Smokeless tobacco: Never  Vaping Use   Vaping Use: Never used  Substance and Sexual Activity   Alcohol use: Not Currently    Comment: Former drinker, 15 years ago   Drug use: Never   Sexual activity: Not Currently   Other Topics Concern   Not on file  Social History Narrative   Tobacco use, amount per day now: None.   Past tobacco use, amount per day: Everyday.   How many years did you use tobacco: 30 years.   Alcohol use (drinks per week): Wine Daily   Diet:   Do you drink/eat things with caffeine: Yes   Marital status: Married                                  What year were you married?    Do you live in a house, apartment, assisted living, condo, trailer, etc.? House  Is it one or more stories? 1 story.   How many persons live in your home? 2   Do you have pets in your home?( please list) No   Highest Level of education completed? Associate   Current or past profession: Director    Do you exercise?    No                             Type and how often?   Do you have a living will?    Do you have a DNR form?                                   If not, do you want to discuss one?   Do you have signed POA/HPOA forms?                        If so, please bring to you appointment      Do you have any difficulty bathing or dressing yourself? Yes   Do you have any difficulty preparing food or eating? Yes   Do you have any difficulty managing your medications? Yes   Do you have any difficulty managing your finances? Yes   Do you have any difficulty affording your medications? Yes   Social Determinants of Health   Financial Resource Strain: Not on file  Food Insecurity: Not on file  Transportation Needs: Not on file  Physical Activity: Not on file  Stress: Not on file  Social Connections: Not on file     Family History: The patient's family history includes Asthma in her brother; Bronchitis in her brother; Colon cancer in her maternal grandfather; Diabetes in her daughter; Heart disease in her daughter; High Cholesterol in her daughter and sister; Hypertension in her daughter and sister.  ROS:   Please see the history of present illness.     All other systems reviewed and are  negative.  EKGs/Labs/Other Studies Reviewed:    The following studies were reviewed today: Cardiac catheterization, echocardiogram, hospital records  Recent Labs: 08/09/2020: ALT 22; BUN 16; Creatinine, Ser 0.93; Hemoglobin 13.3; Magnesium 2.1; Platelets 261; Potassium 4.2; Sodium 138  Recent Lipid Panel    Component Value Date/Time   CHOL 124 04/22/2020 0426   CHOL 120 10/08/2019 1031   TRIG 94 04/22/2020 0426   HDL 41 04/22/2020 0426   HDL 39 (L) 10/08/2019 1031   CHOLHDL 3.0 04/22/2020 0426   VLDL 19 04/22/2020 0426   LDLCALC 64 04/22/2020 0426   LDLCALC 61 10/08/2019 1031     Risk Assessment/Calculations:          Physical Exam:    VS:  BP (!) 100/40 (BP Location: Left Arm, Patient Position: Sitting, Cuff Size: Normal)   Pulse 67   Ht '5\' 6"'$  (1.676 m)   Wt 120 lb (54.4 kg)   SpO2 96%   BMI 19.37 kg/m     Wt Readings from Last 3 Encounters:  08/25/20 120 lb (54.4 kg)  08/09/20 120 lb (54.4 kg)  06/15/20 118 lb (53.5 kg)     GEN:  Well nourished, well developed in no acute distress, in wheel chair HEENT: Normal NECK: No JVD; No carotid bruits LYMPHATICS: No lymphadenopathy CARDIAC: RRR, no murmurs, rubs, gallops, pacer RESPIRATORY:  Clear to auscultation without rales, wheezing or rhonchi  ABDOMEN: Soft, non-tender,  non-distended MUSCULOSKELETAL:  No edema; No deformity  SKIN: Warm and dry NEUROLOGIC:  Alert and oriented x 3, aphasic PSYCHIATRIC:  Normal affect   ASSESSMENT:    1. Chronic combined systolic and diastolic heart failure ([place])   2. Complete heart block ([facility])    PLAN:    In order of problems listed above:  Chronic systolic heart failure secondary to ischemic cardiomyopathy - Status post CABG, primarily wheelchair dependent. - On Entresto 49/51 twice daily, Lasix 20 mg as needed, spironolactone 12.5 mg daily, Jardiance 10 mg a day, Toprol-XL 50 mg a day.  We will send in Toprol-XL 50 mg once a day prescription to make it easier for  her.   Coronary artery disease - Left heart catheterization showed 70% distal left main stenosis, underwent CABG on April 2022.  Complete heart block - Pre and post CABG ended up with heart block.  Pacemaker placed CRT therapy.  History of stroke in 2009 thought to be embolic has been on warfarin since then longstanding aphasia and right-sided hemiparesis.  Chronic anticoagulation - Continue warfarin followed at Coumadin clinic [name].  History of seizure disorder - On Keppra.  Overall stable.  She has an appointment coming up with Dr. Aundra Dubin in late September following echocardiogram.  We will see her back in 6 months.       Medication Adjustments/Labs and Tests Ordered: Current medicines are reviewed at length with the patient today.  Concerns regarding medicines are outlined above.  No orders of the defined types were placed in this encounter.  Meds ordered this encounter  Medications   metoprolol succinate (TOPROL-XL) 50 MG 24 hr tablet    Sig: Take 1 tablet (50 mg total) by mouth daily. Take with or immediately following a meal.    Dispense:  90 tablet    Refill:  3    Patient Instructions  Medication Instructions:  Please increase Metoprolol to 50 mg a day. Continue all other medications as listed.  *If you need a refill on your cardiac medications before your next appointment, please call your pharmacy*  Follow-Up: At [facility] you and your health needs are our priority.  As part of our continuing mission to provide you with exceptional heart care, we have created designated Provider Care Teams.  These Care Teams include your primary Cardiologist (physician) and Advanced Practice Providers (APPs -  Physician Assistants and Nurse Practitioners) who all work together to provide you with the care you need, when you need it.  We recommend signing up for the patient portal called "MyChart".  Sign up information is provided on this After Visit  Summary.  MyChart is used to connect with patients for Virtual Visits (Telemedicine).  Patients are able to view lab/test results, encounter notes, upcoming appointments, etc.  Non-urgent messages can be sent to your provider as well.   To learn more about what you can do with MyChart, go to [email or web address].    Your next appointment:   6 month(s)  The format for your next appointment:   In Person  Provider:   Candee Furbish, MD   Thank you for choosing [facility]!!     Signed, Candee Furbish, MD  08/25/2020 12:54 PM    [place]

## 2020-08-31 DIAGNOSIS — I2510 Atherosclerotic heart disease of native coronary artery without angina pectoris: Secondary | ICD-10-CM | POA: Diagnosis not present

## 2020-08-31 DIAGNOSIS — I69320 Aphasia following cerebral infarction: Secondary | ICD-10-CM | POA: Diagnosis not present

## 2020-08-31 DIAGNOSIS — I110 Hypertensive heart disease with heart failure: Secondary | ICD-10-CM | POA: Diagnosis not present

## 2020-08-31 DIAGNOSIS — R1311 Dysphagia, oral phase: Secondary | ICD-10-CM | POA: Diagnosis not present

## 2020-08-31 DIAGNOSIS — Z48812 Encounter for surgical aftercare following surgery on the circulatory system: Secondary | ICD-10-CM | POA: Diagnosis not present

## 2020-08-31 DIAGNOSIS — I69351 Hemiplegia and hemiparesis following cerebral infarction affecting right dominant side: Secondary | ICD-10-CM | POA: Diagnosis not present

## 2020-08-31 DIAGNOSIS — I442 Atrioventricular block, complete: Secondary | ICD-10-CM | POA: Diagnosis not present

## 2020-08-31 DIAGNOSIS — I5042 Chronic combined systolic (congestive) and diastolic (congestive) heart failure: Secondary | ICD-10-CM | POA: Diagnosis not present

## 2020-08-31 DIAGNOSIS — I69391 Dysphagia following cerebral infarction: Secondary | ICD-10-CM | POA: Diagnosis not present

## 2020-10-21 ENCOUNTER — Other Ambulatory Visit: Payer: Self-pay

## 2020-10-21 ENCOUNTER — Ambulatory Visit (HOSPITAL_BASED_OUTPATIENT_CLINIC_OR_DEPARTMENT_OTHER)
Admission: RE | Admit: 2020-10-21 | Discharge: 2020-10-21 | Disposition: A | Discharge status: Home / Self Care | Payer: Medicare HMO | Source: Ambulatory Visit | Attending: Cardiology | Admitting: Cardiology

## 2020-10-21 ENCOUNTER — Ambulatory Visit (HOSPITAL_COMMUNITY)
Admission: RE | Admit: 2020-10-21 | Discharge: 2020-10-21 | Disposition: A | Discharge status: Home / Self Care | Payer: Medicare HMO | Source: Ambulatory Visit | Attending: Cardiology | Admitting: Cardiology

## 2020-10-21 VITALS — BP 104/50 | HR 60 | Ht 66.0 in | Wt 125.0 lb

## 2020-10-21 DIAGNOSIS — Z7984 Long term (current) use of oral hypoglycemic drugs: Secondary | ICD-10-CM | POA: Insufficient documentation

## 2020-10-21 DIAGNOSIS — Z7982 Long term (current) use of aspirin: Secondary | ICD-10-CM | POA: Diagnosis not present

## 2020-10-21 DIAGNOSIS — E785 Hyperlipidemia, unspecified: Secondary | ICD-10-CM | POA: Diagnosis not present

## 2020-10-21 DIAGNOSIS — Z79899 Other long term (current) drug therapy: Secondary | ICD-10-CM | POA: Diagnosis not present

## 2020-10-21 DIAGNOSIS — I442 Atrioventricular block, complete: Secondary | ICD-10-CM

## 2020-10-21 DIAGNOSIS — Z87891 Personal history of nicotine dependence: Secondary | ICD-10-CM | POA: Diagnosis not present

## 2020-10-21 DIAGNOSIS — G40909 Epilepsy, unspecified, not intractable, without status epilepticus: Secondary | ICD-10-CM | POA: Insufficient documentation

## 2020-10-21 DIAGNOSIS — J90 Pleural effusion, not elsewhere classified: Secondary | ICD-10-CM | POA: Diagnosis not present

## 2020-10-21 DIAGNOSIS — I452 Bifascicular block: Secondary | ICD-10-CM | POA: Diagnosis not present

## 2020-10-21 DIAGNOSIS — I110 Hypertensive heart disease with heart failure: Secondary | ICD-10-CM | POA: Diagnosis not present

## 2020-10-21 DIAGNOSIS — Z7901 Long term (current) use of anticoagulants: Secondary | ICD-10-CM | POA: Diagnosis not present

## 2020-10-21 DIAGNOSIS — Z8673 Personal history of transient ischemic attack (TIA), and cerebral infarction without residual deficits: Secondary | ICD-10-CM | POA: Insufficient documentation

## 2020-10-21 DIAGNOSIS — I2510 Atherosclerotic heart disease of native coronary artery without angina pectoris: Secondary | ICD-10-CM | POA: Insufficient documentation

## 2020-10-21 DIAGNOSIS — I5022 Chronic systolic (congestive) heart failure: Secondary | ICD-10-CM | POA: Insufficient documentation

## 2020-10-21 DIAGNOSIS — I5042 Chronic combined systolic (congestive) and diastolic (congestive) heart failure: Secondary | ICD-10-CM

## 2020-10-21 DIAGNOSIS — Z951 Presence of aortocoronary bypass graft: Secondary | ICD-10-CM | POA: Diagnosis not present

## 2020-10-21 DIAGNOSIS — I083 Combined rheumatic disorders of mitral, aortic and tricuspid valves: Secondary | ICD-10-CM | POA: Insufficient documentation

## 2020-10-21 LAB — BRAIN NATRIURETIC PEPTIDE: B Natriuretic Peptide: 1309.6 pg/mL — ABNORMAL HIGH (ref 0.0–100.0)

## 2020-10-21 LAB — ECHOCARDIOGRAM COMPLETE
Area-P 1/2: 5.38 cm2
Calc EF: 34.8 %
MV M vel: 4.18 m/s
MV Peak grad: 69.9 mmHg
P 1/2 time: 678 msec
Radius: 0.4 cm
S' Lateral: 4.3 cm
Single Plane A2C EF: 32.9 %
Single Plane A4C EF: 41.6 %

## 2020-10-21 LAB — LIPID PANEL
Cholesterol: 132 mg/dL (ref 0–200)
HDL: 53 mg/dL (ref >40)
LDL Cholesterol: 67 mg/dL (ref 0–99)
Total CHOL/HDL Ratio: 2.5 RATIO
Triglycerides: 61 mg/dL (ref <150)
VLDL: 12 mg/dL (ref 0–40)

## 2020-10-21 LAB — BASIC METABOLIC PANEL
Anion gap: 10 (ref 5–15)
BUN: 22 mg/dL (ref 8–23)
CO2: 23 mmol/L (ref 22–32)
Calcium: 9.2 mg/dL (ref 8.9–10.3)
Chloride: 105 mmol/L (ref 98–111)
Creatinine, Ser: 1 mg/dL (ref 0.44–1.00)
GFR, Estimated: >60 mL/min (ref >60)
Glucose, Bld: 76 mg/dL (ref 70–99)
Potassium: 3.9 mmol/L (ref 3.5–5.1)
Sodium: 138 mmol/L (ref 135–145)

## 2020-10-21 MED ORDER — FUROSEMIDE 20 MG PO TABS: 20.0000 mg | ORAL_TABLET | ORAL | 1 refills | Status: DC

## 2020-10-21 MED ORDER — SPIRONOLACTONE 25 MG PO TABS: 25.0000 mg | ORAL_TABLET | Freq: Every day | ORAL | 3 refills | Status: DC

## 2020-10-21 NOTE — Progress Notes (Signed)
  Echocardiogram 2D Echocardiogram has been performed.  [name] 10/21/2020, 9:19 AM

## 2020-10-21 NOTE — Patient Instructions (Signed)
Increase Spironolactone to 25 mg (1 tablet) daily  Change Lasix to 20 mg every other day  Labs done today, your results will be available in MyChart, we will contact you for abnormal readings.   Follow up lab work in 10 days Your physician recommends that you schedule a follow-up appointment in: 6 weeks with app  If you have any questions or concerns before your next appointment please send [place] a message through [place] or call our office at [phone].    TO LEAVE A MESSAGE FOR THE NURSE SELECT OPTION 2, PLEASE LEAVE A MESSAGE INCLUDING: YOUR NAME DATE OF BIRTH CALL BACK NUMBER REASON FOR CALL**this is important as we prioritize the call backs  YOU WILL RECEIVE A CALL BACK THE SAME DAY AS LONG AS YOU CALL BEFORE 4:00 PM At the [facility], you and your health needs are our priority. As part of our continuing mission to provide you with exceptional heart care, we have created designated Provider Care Teams. These Care Teams include your primary Cardiologist (physician) and Advanced Practice Providers (APPs- Physician Assistants and Nurse Practitioners) who all work together to provide you with the care you need, when you need it.   You may see any of the following providers on your designated Care Team at your next follow up: Dr Glori Bickers Dr Loralie Champagne Dr Patrice Paradise, NP Lyda Jester, [place] Ginnie Smart Audry Riles, PharmD   Please be sure to bring in all your medications bottles to every appointment.

## 2020-10-22 NOTE — Progress Notes (Signed)
PCP: [name], MD HF Cardiology: Dr. Aundra Dubin  70 y.o. with history of CVA, chronic systolic CHF, CHB, and CAD returns for followup of CHF.   She had a stroke in 2009, thought to be cardioembolic.  She has had right hemiparesis and aphasia since that time.   [name] was hospitalized in August 2020 with a seizure after being out of her medications for 4 days.  She required intubation.  High-sensitivity troponin trended to 1905. An echocardiogram 08/2018 revealed EF of 35 to 40% with global hypokinesis and moderate LVH, elevated LVEDP.  She also had moderate AR. Nuclear stress test in August 2020 showed a large severe fixed inferior, anterior apical, and inferolateral perfusion defect suggestive of scar without ischemia.  Study was high risk due to EF estimated at 34% but mentions no significant reversible ischemia. She was deemed not a good candidate for cardiac catheterization during that hospitalization.    Patient was admitted in 3/22 following a syncopal episode and was found to be in complete heart block.  This improved initially after stopping nodal blockers.  Echo in 3/22 showed EF 35-40%.  LHC showed significant left main and LCx disease.  She was deemed to be a poor candidate for PCI.  In 4/22, she had CABG with LIMA-LAD and SVG-OM2.  Post-op, she developed CHB again.  St Jude CRT-P device was placed. She was discharged to a rehab facility.   Echo today was reviewed, EF 35% with basal to mid inferior and inferolateral AK, mildly decreased RV function, mild-moderate MR.     She is here with her husband who helps with history due to aphasia. She has not had any chest pain.  She does some walking in the house with her walker but uses a wheelchair outside the house.  Feeling stronger.  Not short of breath with her usual activities.   Labs (4/22): K 3.9, creatinine 0.83, hgb 9.4 Labs (7/22): K 4.2, creatinine 0.93  St Jude device interrogation: Decreased thoracic impedance, 95% BiV  pacing, no AF/VT.   ECG (personally reviewed): NSR, BiV paced  PMH: 1. H/o seizure disorder.  2. CVA: 2009 with aphasia and right hemiparesis.  3. Complete heart block: St Jude CRT-P. 4. Chronic systolic CHF: Ischemic cardiomyopathy. St Jude CRT-P device.  - Echo (3/22): EF 35-40%, inferior and lateral HK, normal RV.  - Coronary angiography (4/22): 60-70% dLM, 60-70% ostial LCx, 99% proximal calcified LCx (LCx was large, dominant vessel).  - Echo (9/22): EF 35% with basal to mid inferior and inferolateral AK, mildly decreased RV function, mild-moderate MR. 5. CAD:  Coronary angiography (4/22) with 60-70% dLM, 60-70% ostial LCx, 99% proximal calcified LCx (LCx was large, dominant vessel). - CABG x 4 in 4/22: LIMA-LAD, SVG-OM2.  6. CHB: St Jude CRT-P placed in 4/22.   Social History   Socioeconomic History   Marital status: Married    Spouse name: [name]   Number of children: 2   Years of education: 12   Highest education level: Not on file  Occupational History    Employer: RETIRED  Tobacco Use   Smoking status: Former    Types: Cigarettes    Quit date: 09/26/2007    Years since quitting: 13.0   Smokeless tobacco: Never  Vaping Use   Vaping Use: Never used  Substance and Sexual Activity   Alcohol use: Not Currently    Comment: Former drinker, 15 years ago   Drug use: Never   Sexual activity: Not Currently  Other Topics  Concern   Not on file  Social History Narrative   Tobacco use, amount per day now: None.   Past tobacco use, amount per day: Everyday.   How many years did you use tobacco: 30 years.   Alcohol use (drinks per week): Wine Daily   Diet:   Do you drink/eat things with caffeine: Yes   Marital status: Married                                  What year were you married?    Do you live in a house, apartment, assisted living, condo, trailer, etc.? House   Is it one or more stories? 1 story.   How many persons live in your home? 2   Do you have pets in your  home?( please list) No   Highest Level of education completed? Associate   Current or past profession: Director    Do you exercise?    No                             Type and how often?   Do you have a living will?    Do you have a DNR form?                                   If not, do you want to discuss one?   Do you have signed POA/HPOA forms?                        If so, please bring to you appointment      Do you have any difficulty bathing or dressing yourself? Yes   Do you have any difficulty preparing food or eating? Yes   Do you have any difficulty managing your medications? Yes   Do you have any difficulty managing your finances? Yes   Do you have any difficulty affording your medications? Yes   Social Determinants of Health   Financial Resource Strain: Not on file  Food Insecurity: Not on file  Transportation Needs: Not on file  Physical Activity: Not on file  Stress: Not on file  Social Connections: Not on file  Intimate Partner Violence: Not on file   Family History  Problem Relation Age of Onset   Diabetes Daughter    High Cholesterol Daughter    Hypertension Daughter    Heart disease Daughter    Hypertension Sister    High Cholesterol Sister    Asthma Brother    Bronchitis Brother    Colon cancer Maternal Grandfather    ROS: All systems reviewed and negative except as per HPI.   Current Outpatient Medications  Medication Sig Dispense Refill   acetaminophen (TYLENOL) 325 MG tablet Take 650 mg by mouth 2 (two) times a week.     aspirin 81 MG chewable tablet Chew 1 tablet (81 mg total) by mouth daily. 30 tablet 6   atorvastatin (LIPITOR) 80 MG tablet Take 1 tablet (80 mg total) by mouth at bedtime. 90 tablet 1   Cholecalciferol 50 MCG (2000 UT) TABS Take 1 tablet by mouth daily in the afternoon.     empagliflozin (JARDIANCE) 10 MG TABS tablet Take 1 tablet (10 mg total) by mouth daily before breakfast. 30 tablet 5   Lacosamide  100 MG TABS Take 1 tablet (100  mg total) by mouth 2 (two) times daily. 60 tablet 5   levETIRAcetam (KEPPRA) 750 MG tablet Take 2 tablets (1,500 mg total) by mouth 2 (two) times daily. 360 tablet 4   metoprolol succinate (TOPROL-XL) 50 MG 24 hr tablet Take 1 tablet (50 mg total) by mouth daily. Take with or immediately following a meal. 90 tablet 3   Nutritional Supplements (NUTRITIONAL SUPPLEMENT PO) Take 120 mLs by mouth once. Medpass for weight gain.     sacubitril-valsartan (ENTRESTO) 49-51 MG Take 1 tablet by mouth 2 (two) times daily. 180 tablet 3   warfarin (COUMADIN) 4 MG tablet Take 4 mg by mouth every evening.     warfarin (COUMADIN) 5 MG tablet Take 5 mg by mouth daily.     furosemide (LASIX) 20 MG tablet Take 1 tablet (20 mg total) by mouth every other day. 30 tablet 1   spironolactone (ALDACTONE) 25 MG tablet Take 1 tablet (25 mg total) by mouth daily. 45 tablet 3   No current facility-administered medications for this encounter.   BP (!) 104/50   Pulse 60   Ht 5\' 6"  (1.676 m)   Wt 56.7 kg (125 lb) Comment: Patient states weight is 125lb- not able to stand  SpO2 99%   BMI 20.18 kg/m  General: NAD Neck: JVP 8 cm with HJR, no thyromegaly or thyroid nodule.  Lungs: Clear to auscultation bilaterally with normal respiratory effort. CV: Nondisplaced PMI.  Heart regular S1/S2, no S3/S4, no murmur.  No peripheral edema.  No carotid bruit.  Normal pedal pulses.  Abdomen: Soft, nontender, no hepatosplenomegaly, no distention.  Skin: Intact without lesions or rashes.  Neurologic: Alert.  Right hemiparesis.  Expressive aphasia.  Psych: Normal affect. Extremities: No clubbing or cyanosis.  HEENT: Normal.   Asssessment/Plan: 1. Complete heart block: Patient had baseline NSR with LAFB/RBBB, so there was underlying conduction system disease.  She was transiently in CHB pre-CABG and developed persistent CHB post-CABG.  She now has [employer] CRT-P device.   2. Chronic systolic CHF: Ischemic cardiomyopathy.  Echo in 8/20  with EF 35-40% and wall motion abnormalities. LHC in 4/22 showed left main/severe codominant LCx disease.  Echo in 3/22 with EF 35-40%, inferior and lateral hypokinesis.  Echo today with EF 35%, mild RV dysfunction.  Now s/p CABG.  Probably NYHA class II.  Mild volume overload by exam and [place].    - Continue Entresto 24/26 bid.   - Increase spironolactone to 25 mg daily with BMET today and in 10 days.   - Continue Toprol XL 25 mg daily.  - Start on Lasix 20 mg every other day.  3. 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  Has been on warfarin.  Has had long-standing aphasia and right hemiparesis.   - Continue warfarin.    4. H/o seizure disorder: Continue Keppra.  5. CAD:  LHC showed 60-70% distal left main stenosis, heavily calcified proximal LCx with 60-70% stenosis at the ostium and discrete 99% stenosis in the proximal LCx.  I suspect that her CHB is related to chronic ischemia from LM-LCx disease worsened by nodal blockade. Had CABG x 2 4/22 w/ LIMA-LAD, SVG-OM2.  - Continue ASA 81 daily.  - Continue statin, check lipids.   Followup in 6 wks with APP.   [name] 10/22/2020

## 2020-10-26 ENCOUNTER — Other Ambulatory Visit: Payer: Self-pay

## 2020-10-26 ENCOUNTER — Ambulatory Visit (INDEPENDENT_AMBULATORY_CARE_PROVIDER_SITE_OTHER): Payer: Medicare HMO | Admitting: Family Medicine

## 2020-10-26 ENCOUNTER — Encounter: Payer: Self-pay | Admitting: Family Medicine

## 2020-10-26 VITALS — BP 118/72 | HR 62 | Temp 96.9°F | Ht 66.0 in

## 2020-10-26 DIAGNOSIS — G40909 Epilepsy, unspecified, not intractable, without status epilepticus: Secondary | ICD-10-CM

## 2020-10-26 DIAGNOSIS — M2450 Contracture, unspecified joint: Secondary | ICD-10-CM | POA: Diagnosis not present

## 2020-10-26 DIAGNOSIS — I69959 Hemiplegia and hemiparesis following unspecified cerebrovascular disease affecting unspecified side: Secondary | ICD-10-CM

## 2020-10-26 DIAGNOSIS — I5042 Chronic combined systolic (congestive) and diastolic (congestive) heart failure: Secondary | ICD-10-CM | POA: Diagnosis not present

## 2020-10-26 DIAGNOSIS — Z23 Encounter for immunization: Secondary | ICD-10-CM | POA: Diagnosis not present

## 2020-10-26 DIAGNOSIS — M21371 Foot drop, right foot: Secondary | ICD-10-CM | POA: Diagnosis not present

## 2020-10-26 DIAGNOSIS — E782 Mixed hyperlipidemia: Secondary | ICD-10-CM | POA: Diagnosis not present

## 2020-10-26 DIAGNOSIS — I69322 Dysarthria following cerebral infarction: Secondary | ICD-10-CM | POA: Diagnosis not present

## 2020-10-26 DIAGNOSIS — R4189 Other symptoms and signs involving cognitive functions and awareness: Secondary | ICD-10-CM

## 2020-10-26 DIAGNOSIS — R29818 Other symptoms and signs involving the nervous system: Secondary | ICD-10-CM | POA: Diagnosis not present

## 2020-10-26 NOTE — Progress Notes (Signed)
Provider:  Alain Honey, MD  Careteam: Patient Care Team: [name], MD as PCP - General (Family Medicine) Constance Haw, MD as PCP - Electrophysiology (Cardiology) Jerline Pain, MD as PCP - Cardiology (Cardiology) Adrian Prows, MD as Attending Physician (Cardiology) Burnard Bunting, MD (Internal Medicine) Skeet Latch, MD as Attending Physician (Cardiology) Leta Baptist, Earlean Polka, MD as Consulting Physician (Neurology)  PLACE OF SERVICE:  [place] Directive information    Allergies  Allergen Reactions   Lexiscan [Regadenoson] Other (See Comments)    Perioral tingling and throat tightness    Chief Complaint  Patient presents with   Medical Management of Chronic Issues    Patient presents today for a 4 month follow-up.   Quality Metric Gaps    Colonoscopy, zoster, flu and Covid booster vaccine     HPI: Patient is a 70 y.o. female routine follow-up of chronic problems including heart failure late effects of CVA including aphasia epilepsy and probable vascular dementia.  She has seen cardiology recently.  There her BNP was elevated and diuretic therapy was adjusted.  She has follow-up appointment to check that.  Her husband is her primary caretaker and seems very attentive to her needs.  She gets around her house with a cane as well as wheelchair.  He recently found her on the floor where he had left to run an errand; she is very unstable when she tries to ambulate because she also has foot drop as well as the paralysis. Continues to wear a splint on her left wrist and hand to prevent contracture.  She sleeps without the splint.  Not doing any exercises to prevent contractures there. Has not had seizures in several years.  Has follow-up appointment with neurology.  I mention to husband that if no seizures possible DC anticonvulsant but not without neurology consent.  Review of Systems:  Review of Systems  HENT: Negative.    Respiratory:  Negative.    Cardiovascular: Negative.   Neurological:  Positive for speech change, focal weakness and seizures.  All other systems reviewed and are negative.  Past Medical History:  Diagnosis Date   Adenomatous polyp of colon 09/2012   repeat colonoscopy in 5 years by Dr. Sharlett Iles   CHF (congestive heart failure) ([place])    EF 15-20% as of 05/28/11 (Dr Legrand [facility] summary)   CVA (cerebral infarction) 12/2007   H/O heart bypass surgery 2022   Hemiparesis ([place])    Hyperlipidemia    Hypertension    Seizures ([place])    Sickle cell anemia ([place])    Stroke ([place])    Vitamin D deficiency 2010   Past Surgical History:  Procedure Laterality Date   ABDOMINAL HYSTERECTOMY     BIV PACEMAKER INSERTION CRT-P N/A 04/29/2020   Procedure: BIV PACEMAKER INSERTION CRT-P;  Surgeon: Constance Haw, MD;  Location: [facility];  Service: Cardiovascular;  Laterality: N/A;   CORONARY ARTERY BYPASS GRAFT N/A 04/25/2020   Procedure: CORONARY ARTERY BYPASS GRAFTING (CABG) TIMES TWO USING LEFT INTERNAL MAMMARY ARTERY AND RIGHT GREATER SAPHENOUS VEIN HARVESTED ENDOSCOPICALLY;  Surgeon: Melrose Nakayama, MD;  Location: [facility];  Service: Open Heart Surgery;  Laterality: N/A;   FRACTURE SURGERY     HIP SURGERY     LEFT HEART CATH AND CORONARY ANGIOGRAPHY N/A 04/22/2020   Procedure: LEFT HEART CATH AND CORONARY ANGIOGRAPHY;  Surgeon: Larey Dresser, MD;  Location: [facility];  Service: Cardiovascular;  Laterality: N/A;  TEE WITHOUT CARDIOVERSION  04/25/2020   Procedure: TRANSESOPHAGEAL ECHOCARDIOGRAM (TEE);  Surgeon: Melrose Nakayama, MD;  Location: [facility];  Service: Open Heart Surgery;;   TEMPORARY PACEMAKER N/A 04/21/2020   Procedure: TEMPORARY PACEMAKER;  Surgeon: Larey Dresser, MD;  Location: [facility];  Service: Cardiovascular;  Laterality: N/A;   TUBAL LIGATION     Social History:   reports that she quit smoking about 13 years ago. Her smoking use included  cigarettes. She has never used smokeless tobacco. She reports that she does not currently use alcohol. She reports that she does not use drugs.  Family History  Problem Relation Age of Onset   Diabetes Daughter    High Cholesterol Daughter    Hypertension Daughter    Heart disease Daughter    Hypertension Sister    High Cholesterol Sister    Asthma Brother    Bronchitis Brother    Colon cancer Maternal Grandfather     Medications: Patient's Medications  New Prescriptions   No medications on file  Previous Medications   ACETAMINOPHEN (TYLENOL) 325 MG TABLET    Take 650 mg by mouth 2 (two) times a week.   ASPIRIN 81 MG CHEWABLE TABLET    Chew 1 tablet (81 mg total) by mouth daily.   ATORVASTATIN (LIPITOR) 80 MG TABLET    Take 1 tablet (80 mg total) by mouth at bedtime.   CHOLECALCIFEROL 50 MCG (2000 UT) TABS    Take 1 tablet by mouth daily in the afternoon.   EMPAGLIFLOZIN (JARDIANCE) 10 MG TABS TABLET    Take 1 tablet (10 mg total) by mouth daily before breakfast.   FUROSEMIDE (LASIX) 20 MG TABLET    Take 1 tablet (20 mg total) by mouth every other day.   LACOSAMIDE 100 MG TABS    Take 1 tablet (100 mg total) by mouth 2 (two) times daily.   LEVETIRACETAM (KEPPRA) 750 MG TABLET    Take 2 tablets (1,500 mg total) by mouth 2 (two) times daily.   METOPROLOL SUCCINATE (TOPROL-XL) 50 MG 24 HR TABLET    Take 1 tablet (50 mg total) by mouth daily. Take with or immediately following a meal.   NUTRITIONAL SUPPLEMENTS (NUTRITIONAL SUPPLEMENT PO)    Take 120 mLs by mouth once. Medpass for weight gain.   SACUBITRIL-VALSARTAN (ENTRESTO) 49-51 MG    Take 1 tablet by mouth 2 (two) times daily.   SPIRONOLACTONE (ALDACTONE) 25 MG TABLET    Take 1 tablet (25 mg total) by mouth daily.   WARFARIN (COUMADIN) 5 MG TABLET    Take 5 mg by mouth daily.  Modified Medications   No medications on file  Discontinued Medications   WARFARIN (COUMADIN) 4 MG TABLET    Take 4 mg by mouth every evening.     Physical Exam:  Vitals:   10/26/20 1009  BP: 118/72  Pulse: 62  Temp: (!) 96.9 F (36.1 C)  SpO2: 98%  Height: 5\' 6"  (1.676 m)   Body mass index is 20.18 kg/m. Wt Readings from Last 3 Encounters:  10/21/20 125 lb (56.7 kg)  08/25/20 120 lb (54.4 kg)  08/09/20 120 lb (54.4 kg)    Physical Exam Vitals and nursing note reviewed.  Constitutional:      Appearance: Normal appearance.     Comments: Patient unable to articulate any clear words  Cardiovascular:     Rate and Rhythm: Regular rhythm. Bradycardia present.  Pulmonary:     Effort: Pulmonary effort is normal.  Breath sounds: Normal breath sounds.  Neurological:     Mental Status: She is alert.     Comments: Has right hemiplegia and foot drop on the right  Psychiatric:        Mood and Affect: Mood normal.    Labs reviewed: Basic Metabolic Panel: Recent Labs    04/26/20 0117 04/26/20 0811 04/26/20 1555 04/27/20 0359 06/23/20 0927 08/09/20 1746 10/21/20 1020  NA 136   < > 137   < > 139 138 138  K 4.1   < > 4.3   < > 4.1 4.2 3.9  CL 105  --  106   < > 107 105 105  CO2 22  --  25   < > 24 23 23   GLUCOSE 170*  --  139*   < > 98 82 76  BUN 13  --  13   < > 10 16 22   CREATININE 0.90  --  1.06*   < > 0.92 0.93 1.00  CALCIUM 8.7*  --  8.2*   < > 9.2 9.4 9.2  MG 2.9*  --  2.2  --   --  2.1  --    < > = values in this interval not displayed.   Liver Function Tests: Recent Labs    12/14/19 1020 04/21/20 0825 08/09/20 2120  AST 24 28 23   ALT 9 15 22   ALKPHOS 156* 95 89  BILITOT 0.3 0.4 0.4  PROT 7.5 6.5 7.4  ALBUMIN 4.3 3.3* 3.7   Recent Labs    08/09/20 2120  LIPASE 25   No results for input(s): AMMONIA in the last 8760 hours. CBC: Recent Labs    03/02/20 1221 04/21/20 0825 04/21/20 0832 05/04/20 0232 05/05/20 0031 05/12/20 0000 08/09/20 1746  WBC 6.8 9.2   < > 12.6* 14.0* 9.1 6.4  NEUTROABS 4.0 1.7  --   --   --   --  2.4  HGB 12.8 11.1*   < > 9.4* 9.4* 10.6* 13.3  HCT 38.5 37.0    < > 28.2* 28.5* 32* 40.7  MCV 85 95.9   < > 87.6 88.5  --  84.8  PLT 303 322   < > 429* 533* 643* 261   < > = values in this interval not displayed.   Lipid Panel: Recent Labs    04/22/20 0426 10/21/20 1020  CHOL 124 132  HDL 41 53  LDLCALC 64 67  TRIG 94 61  CHOLHDL 3.0 2.5   TSH: No results for input(s): TSH in the last 8760 hours. A1C: Lab Results  Component Value Date   HGBA1C 6.1 (H) 04/22/2020     Assessment/Plan  1. Acquired right foot drop Secondary to CVA.  She has brace but does not wear it  2. Chronic combined systolic and diastolic heart failure (HCC) Diuretic therapy being adjusted by cardiology with follow-up blood work  3. Contracture of joint Late effect of CVA; wearing splint  4. Dysarthria as late effect of cerebrovascular accident (CVA) Late effect of CVA which occurred in 2009.  She seems to be able to make her needs known with listeners asking questions  5. Hemiplegia of dominant side as late effect following cerebrovascular disease ([address]) Since stroke occurred some 12 years ago, hemiplegia as per him  6. Mixed hyperlipidemia Takes atorvastatin and LDL is at goal at 67  7. Neurocognitive deficits Husband thinks she is being forgetful.  This is not surprising given stroke.  Likely has some vascular dementia  8. Seizure disorder ([place]) No seizures in 3 years per husband history.  Appointment with neurology in December.  Ask him to talk to the neurologist about necessity of continuing anticonvulsant   Alain Honey, MD [place] [phone]

## 2020-10-31 ENCOUNTER — Ambulatory Visit (INDEPENDENT_AMBULATORY_CARE_PROVIDER_SITE_OTHER): Payer: Medicare HMO

## 2020-10-31 DIAGNOSIS — I442 Atrioventricular block, complete: Secondary | ICD-10-CM | POA: Diagnosis not present

## 2020-11-02 ENCOUNTER — Ambulatory Visit (HOSPITAL_COMMUNITY)
Admission: RE | Admit: 2020-11-02 | Discharge: 2020-11-02 | Disposition: A | Discharge status: Home / Self Care | Payer: Medicare HMO | Source: Ambulatory Visit | Attending: Internal Medicine | Admitting: Internal Medicine

## 2020-11-02 ENCOUNTER — Other Ambulatory Visit: Payer: Self-pay

## 2020-11-02 DIAGNOSIS — I5042 Chronic combined systolic (congestive) and diastolic (congestive) heart failure: Secondary | ICD-10-CM | POA: Insufficient documentation

## 2020-11-02 LAB — CUP PACEART REMOTE DEVICE CHECK
Battery Remaining Longevity: 80 mo
Battery Remaining Percentage: 89 %
Battery Voltage: 2.99 V
Brady Statistic AP VP Percent: 38 %
Brady Statistic AP VS Percent: 1 %
Brady Statistic AS VP Percent: 57 %
Brady Statistic AS VS Percent: 1 %
Brady Statistic RA Percent Paced: 32 %
Date Time Interrogation Session: 20221010020008
Implantable Lead Implant Date: 20220411
Implantable Lead Implant Date: 20220411
Implantable Lead Implant Date: 20220411
Implantable Lead Location: 753858
Implantable Lead Location: 753859
Implantable Lead Location: 753860
Implantable Pulse Generator Implant Date: 20220411
Lead Channel Impedance Value: 410 Ohm
Lead Channel Impedance Value: 440 Ohm
Lead Channel Impedance Value: 550 Ohm
Lead Channel Pacing Threshold Amplitude: 0.625 V
Lead Channel Pacing Threshold Amplitude: 0.75 V
Lead Channel Pacing Threshold Amplitude: 1.125 V
Lead Channel Pacing Threshold Pulse Width: 0.5 ms
Lead Channel Pacing Threshold Pulse Width: 0.5 ms
Lead Channel Pacing Threshold Pulse Width: 0.5 ms
Lead Channel Sensing Intrinsic Amplitude: 3.4 mV
Lead Channel Sensing Intrinsic Amplitude: 5.1 mV
Lead Channel Setting Pacing Amplitude: 2 V
Lead Channel Setting Pacing Amplitude: 2 V
Lead Channel Setting Pacing Amplitude: 2.125
Lead Channel Setting Pacing Pulse Width: 0.5 ms
Lead Channel Setting Pacing Pulse Width: 0.5 ms
Lead Channel Setting Sensing Sensitivity: 4 mV
Pulse Gen Model: 3562
Pulse Gen Serial Number: 6262141

## 2020-11-02 LAB — BASIC METABOLIC PANEL
Anion gap: 7 (ref 5–15)
BUN: 19 mg/dL (ref 8–23)
CO2: 25 mmol/L (ref 22–32)
Calcium: 9.1 mg/dL (ref 8.9–10.3)
Chloride: 107 mmol/L (ref 98–111)
Creatinine, Ser: 1.03 mg/dL — ABNORMAL HIGH (ref 0.44–1.00)
GFR, Estimated: 58 mL/min — ABNORMAL LOW (ref >60)
Glucose, Bld: 80 mg/dL (ref 70–99)
Potassium: 4.5 mmol/L (ref 3.5–5.1)
Sodium: 139 mmol/L (ref 135–145)

## 2020-11-08 NOTE — Progress Notes (Signed)
Remote pacemaker transmission.   

## 2020-11-30 NOTE — Progress Notes (Signed)
PCP: [name], MD HF Cardiology: Dr. Aundra Dubin  70 y.o. with history of CVA, chronic systolic CHF, CHB, and CAD returns for followup of CHF.   She had a stroke in 2009, thought to be cardioembolic.  She has had right hemiparesis and aphasia since that time.   [name] was hospitalized in August 2020 with a seizure after being out of her medications for 4 days.  She required intubation.  High-sensitivity troponin trended to 1905. An echocardiogram 08/2018 revealed EF of 35 to 40% with global hypokinesis and moderate LVH, elevated LVEDP.  She also had moderate AR. Nuclear stress test in August 2020 showed a large severe fixed inferior, anterior apical, and inferolateral perfusion defect suggestive of scar without ischemia.  Study was high risk due to EF estimated at 34% but mentions no significant reversible ischemia. She was deemed not a good candidate for cardiac catheterization during that hospitalization.    Patient was admitted in 3/22 following a syncopal episode and was found to be in complete heart block.  This improved initially after stopping nodal blockers.  Echo in 3/22 showed EF 35-40%.  LHC showed significant left main and LCx disease.  She was deemed to be a poor candidate for PCI.  In 4/22, she had CABG with LIMA-LAD and SVG-OM2.  Post-op, she developed CHB again.  St Jude CRT-P device was placed. She was discharged to a rehab facility.   Echo 9/22 EF 35% with basal to mid inferior and inferolateral AK, mildly decreased RV function, mild-moderate MR.     Today she returns for HF follow up with her husband who helps with history due to her aphasia. She has had some chest discomfort x 3 days after pulling a muscle, occurs when she uses her left arm. Overall feeling fine. She is able to get around her house with her walker without significant dyspnea. Denies dizziness, edema, or PND/Orthopnea. Appetite ok. No fever or chills. Unable to stand today for a weight.  Taking all medications.    Labs (4/22): K 3.9, creatinine 0.83, hgb 9.4 Labs (7/22): K 4.2, creatinine 0.93 Labs (10/22): K 4.5, creatinine 1.03, LDL 67, HDL 53  St Jude device interrogation: Stable thoracic impedance, BiV pacing, no AF/VT.   ECG (personally reviewed): NSR, BiV paced   PMH: 1. H/o seizure disorder.  2. CVA: 2009 with aphasia and right hemiparesis.  3. Complete heart block: St Jude CRT-P. 4. Chronic systolic CHF: Ischemic cardiomyopathy. St Jude CRT-P device.  - Echo (3/22): EF 35-40%, inferior and lateral HK, normal RV.  - Coronary angiography (4/22): 60-70% dLM, 60-70% ostial LCx, 99% proximal calcified LCx (LCx was large, dominant vessel).  - Echo (9/22): EF 35% with basal to mid inferior and inferolateral AK, mildly decreased RV function, mild-moderate MR. 5. CAD:  Coronary angiography (4/22) with 60-70% dLM, 60-70% ostial LCx, 99% proximal calcified LCx (LCx was large, dominant vessel). - CABG x 4 in 4/22: LIMA-LAD, SVG-OM2.  6. CHB: St Jude CRT-P placed in 4/22.   Social History   Socioeconomic History   Marital status: Married    Spouse name: [name]   Number of children: 2   Years of education: 12   Highest education level: Not on file  Occupational History    Employer: RETIRED  Tobacco Use   Smoking status: Former    Types: Cigarettes    Quit date: 09/26/2007    Years since quitting: 13.1   Smokeless tobacco: Never  Vaping Use   Vaping Use: Never  used  Substance and Sexual Activity   Alcohol use: Not Currently    Comment: Former drinker, 15 years ago   Drug use: Never   Sexual activity: Not Currently  Other Topics Concern   Not on file  Social History Narrative   Tobacco use, amount per day now: None.   Past tobacco use, amount per day: Everyday.   How many years did you use tobacco: 30 years.   Alcohol use (drinks per week): Wine Daily   Diet:   Do you drink/eat things with caffeine: Yes   Marital status: Married                                  What year were you  married?    Do you live in a house, apartment, assisted living, condo, trailer, etc.? House   Is it one or more stories? 1 story.   How many persons live in your home? 2   Do you have pets in your home?( please list) No   Highest Level of education completed? Associate   Current or past profession: Director    Do you exercise?    No                             Type and how often?   Do you have a living will?    Do you have a DNR form?                                   If not, do you want to discuss one?   Do you have signed POA/HPOA forms?                        If so, please bring to you appointment      Do you have any difficulty bathing or dressing yourself? Yes   Do you have any difficulty preparing food or eating? Yes   Do you have any difficulty managing your medications? Yes   Do you have any difficulty managing your finances? Yes   Do you have any difficulty affording your medications? Yes   Social Determinants of Health   Financial Resource Strain: Not on file  Food Insecurity: Not on file  Transportation Needs: Not on file  Physical Activity: Not on file  Stress: Not on file  Social Connections: Not on file  Intimate Partner Violence: Not on file   Family History  Problem Relation Age of Onset   Diabetes Daughter    High Cholesterol Daughter    Hypertension Daughter    Heart disease Daughter    Hypertension Sister    High Cholesterol Sister    Asthma Brother    Bronchitis Brother    Colon cancer Maternal Grandfather    ROS: All systems reviewed and negative except as per HPI.   Current Outpatient Medications  Medication Sig Dispense Refill   acetaminophen (TYLENOL) 325 MG tablet Take 650 mg by mouth 2 (two) times a week.     aspirin 81 MG chewable tablet Chew 1 tablet (81 mg total) by mouth daily. 30 tablet 6   atorvastatin (LIPITOR) 80 MG tablet Take 1 tablet (80 mg total) by mouth at bedtime. 90 tablet 1   Cholecalciferol 50 MCG (2000 UT) TABS Take  1 tablet  by mouth daily in the afternoon.     empagliflozin (JARDIANCE) 10 MG TABS tablet Take 1 tablet (10 mg total) by mouth daily before breakfast. 30 tablet 5   furosemide (LASIX) 20 MG tablet Take 1 tablet (20 mg total) by mouth every other day. 30 tablet 1   Lacosamide 100 MG TABS Take 1 tablet (100 mg total) by mouth 2 (two) times daily. 60 tablet 5   levETIRAcetam (KEPPRA) 750 MG tablet Take 2 tablets (1,500 mg total) by mouth 2 (two) times daily. 360 tablet 4   metoprolol succinate (TOPROL-XL) 50 MG 24 hr tablet Take 1 tablet (50 mg total) by mouth daily. Take with or immediately following a meal. 90 tablet 3   Nutritional Supplements (NUTRITIONAL SUPPLEMENT PO) Take 120 mLs by mouth once. Medpass for weight gain.     sacubitril-valsartan (ENTRESTO) 49-51 MG Take 1 tablet by mouth 2 (two) times daily. 180 tablet 3   spironolactone (ALDACTONE) 25 MG tablet Take 1 tablet (25 mg total) by mouth daily. 45 tablet 3   warfarin (COUMADIN) 5 MG tablet Take 5 mg by mouth daily.     No current facility-administered medications for this encounter.   BP 110/74   Pulse 60   SpO2 96%   Wt Readings from Last 3 Encounters:  10/21/20 56.7 kg (125 lb)  08/25/20 54.4 kg (120 lb)  08/09/20 54.4 kg (120 lb)   General:  NAD. No resp difficulty, arrived in [facility], cachectic HEENT: Normal Neck: Supple. No JVD. Carotids 2+ bilat; no bruits. No lymphadenopathy or thryomegaly appreciated. Cor: PMI nondisplaced. Regular rate & rhythm. No rubs, gallops or murmurs. Lungs: Clear Abdomen: Soft, nontender, nondistended. No hepatosplenomegaly. No bruits or masses. Good bowel sounds. Extremities: No cyanosis, clubbing, rash, edema; + right wrist splint Neuro: Alert & oriented x 3, cranial nerves grossly intact. + Right sided hemiparesis. Affect pleasant. +aphasia  Asssessment/Plan: 1. Complete heart block: Patient had baseline NSR with LAFB/RBBB, so there was underlying conduction system disease.  She was transiently in  CHB pre-CABG and developed persistent CHB post-CABG.  She now has [employer] CRT-P device.   2. Chronic systolic CHF: Ischemic cardiomyopathy.  Echo in 8/20 with EF 35-40% and wall motion abnormalities. LHC in 4/22 showed left main/severe codominant LCx disease.  Echo in 3/22 with EF 35-40%, inferior and lateral hypokinesis.  Echo today with EF 35%, mild RV dysfunction.  Now s/p CABG.  Probably NYHA class II.  She is not volume overloaded by exam or [place].    - Continue Entresto 49/51 mg bid.  No BP room to increase today. BMET today. - Continue spironolactone 25 mg daily. - Continue Toprol XL 50 mg daily.  - Continue Jardiance 10 mg daily. No GU symptoms. - Continue Lasix 20 mg every other day.  3. 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  Has been on warfarin.  Has had long-standing aphasia and right hemiparesis.   - Continue warfarin.    4. H/o seizure disorder: Continue Keppra.  5. CAD:  LHC showed 60-70% distal left main stenosis, heavily calcified proximal LCx with 60-70% stenosis at the ostium and discrete 99% stenosis in the proximal LCx.  I suspect that her CHB is related to chronic ischemia from LM-LCx disease worsened by nodal blockade. Had CABG x 2 4/22 w/ LIMA-LAD, SVG-OM2. No s/s ischemia. I think her current chest discomfort is MSK in nature, as pain is reproducable with palpation and when she raises her left arm. -  Continue ASA 81 daily.  - Continue statin, lipids ok 9/22.  Followup in 2-3 months with Dr. Wynema Birch [facility] FNP 12/02/2020

## 2020-12-02 ENCOUNTER — Ambulatory Visit (HOSPITAL_COMMUNITY)
Admission: RE | Admit: 2020-12-02 | Discharge: 2020-12-02 | Disposition: A | Discharge status: Home / Self Care | Payer: Medicare HMO | Source: Ambulatory Visit | Attending: Family Medicine | Admitting: Family Medicine

## 2020-12-02 ENCOUNTER — Encounter (HOSPITAL_COMMUNITY): Payer: Self-pay

## 2020-12-02 VITALS — BP 110/74 | HR 60

## 2020-12-02 DIAGNOSIS — Z87891 Personal history of nicotine dependence: Secondary | ICD-10-CM | POA: Diagnosis not present

## 2020-12-02 DIAGNOSIS — Z950 Presence of cardiac pacemaker: Secondary | ICD-10-CM | POA: Diagnosis not present

## 2020-12-02 DIAGNOSIS — Z8679 Personal history of other diseases of the circulatory system: Secondary | ICD-10-CM | POA: Insufficient documentation

## 2020-12-02 DIAGNOSIS — I25810 Atherosclerosis of coronary artery bypass graft(s) without angina pectoris: Secondary | ICD-10-CM | POA: Diagnosis not present

## 2020-12-02 DIAGNOSIS — Z79899 Other long term (current) drug therapy: Secondary | ICD-10-CM | POA: Diagnosis not present

## 2020-12-02 DIAGNOSIS — I442 Atrioventricular block, complete: Secondary | ICD-10-CM

## 2020-12-02 DIAGNOSIS — Z951 Presence of aortocoronary bypass graft: Secondary | ICD-10-CM | POA: Insufficient documentation

## 2020-12-02 DIAGNOSIS — Z8673 Personal history of transient ischemic attack (TIA), and cerebral infarction without residual deficits: Secondary | ICD-10-CM | POA: Insufficient documentation

## 2020-12-02 DIAGNOSIS — Z7901 Long term (current) use of anticoagulants: Secondary | ICD-10-CM | POA: Insufficient documentation

## 2020-12-02 DIAGNOSIS — R4701 Aphasia: Secondary | ICD-10-CM | POA: Diagnosis not present

## 2020-12-02 DIAGNOSIS — Z7982 Long term (current) use of aspirin: Secondary | ICD-10-CM | POA: Insufficient documentation

## 2020-12-02 DIAGNOSIS — R0789 Other chest pain: Secondary | ICD-10-CM | POA: Diagnosis not present

## 2020-12-02 DIAGNOSIS — G40909 Epilepsy, unspecified, not intractable, without status epilepticus: Secondary | ICD-10-CM | POA: Diagnosis not present

## 2020-12-02 DIAGNOSIS — I255 Ischemic cardiomyopathy: Secondary | ICD-10-CM | POA: Insufficient documentation

## 2020-12-02 DIAGNOSIS — I2510 Atherosclerotic heart disease of native coronary artery without angina pectoris: Secondary | ICD-10-CM | POA: Insufficient documentation

## 2020-12-02 DIAGNOSIS — Z7984 Long term (current) use of oral hypoglycemic drugs: Secondary | ICD-10-CM | POA: Diagnosis not present

## 2020-12-02 DIAGNOSIS — Z09 Encounter for follow-up examination after completed treatment for conditions other than malignant neoplasm: Secondary | ICD-10-CM | POA: Insufficient documentation

## 2020-12-02 DIAGNOSIS — I5042 Chronic combined systolic (congestive) and diastolic (congestive) heart failure: Secondary | ICD-10-CM | POA: Diagnosis not present

## 2020-12-02 LAB — BASIC METABOLIC PANEL
Anion gap: 9 (ref 5–15)
BUN: 21 mg/dL (ref 8–23)
CO2: 24 mmol/L (ref 22–32)
Calcium: 9.1 mg/dL (ref 8.9–10.3)
Chloride: 103 mmol/L (ref 98–111)
Creatinine, Ser: 1.05 mg/dL — ABNORMAL HIGH (ref 0.44–1.00)
GFR, Estimated: 57 mL/min — ABNORMAL LOW (ref >60)
Glucose, Bld: 78 mg/dL (ref 70–99)
Potassium: 4.2 mmol/L (ref 3.5–5.1)
Sodium: 136 mmol/L (ref 135–145)

## 2020-12-02 NOTE — Patient Instructions (Signed)
It was great to see you today! No medication changes are needed at this time.  Labs today We will only contact you if something comes back abnormal or we need to make some changes. Otherwise no news is good news!  Your physician recommends that you schedule a follow-up appointment in: 2-3 months with Dr Aundra Dubin  Do the following things EVERYDAY: Weigh yourself in the morning before breakfast. Write it down and keep it in a log. Take your medicines as prescribed Eat low salt foods--Limit salt (sodium) to 2000 mg per day.  Stay as active as you can everyday Limit all fluids for the day to less than 2 liters  At the [facility], you and your health needs are our priority. As part of our continuing mission to provide you with exceptional heart care, we have created designated Provider Care Teams. These Care Teams include your primary Cardiologist (physician) and Advanced Practice Providers (APPs- Physician Assistants and Nurse Practitioners) who all work together to provide you with the care you need, when you need it.   You may see any of the following providers on your designated Care Team at your next follow up: Dr Glori Bickers Dr Haynes Kerns, NP Lyda Jester, [place] [facility] [place], [place] Audry Riles, PharmD   Please be sure to bring in all your medications bottles to every appointment.    If you have any questions or concerns before your next appointment please send [place] a message through [place] or call our office at [phone].    TO LEAVE A MESSAGE FOR THE NURSE SELECT OPTION 2, PLEASE LEAVE A MESSAGE INCLUDING: YOUR NAME DATE OF BIRTH CALL BACK NUMBER REASON FOR CALL**this is important as we prioritize the call backs  YOU WILL RECEIVE A CALL BACK THE SAME DAY AS LONG AS YOU CALL BEFORE 4:00 PM

## 2020-12-07 ENCOUNTER — Other Ambulatory Visit: Payer: Self-pay | Admitting: Neurology

## 2020-12-07 DIAGNOSIS — G40909 Epilepsy, unspecified, not intractable, without status epilepticus: Secondary | ICD-10-CM

## 2020-12-16 ENCOUNTER — Other Ambulatory Visit: Payer: Self-pay | Admitting: Neurology

## 2020-12-16 DIAGNOSIS — G40909 Epilepsy, unspecified, not intractable, without status epilepticus: Secondary | ICD-10-CM

## 2020-12-20 ENCOUNTER — Telehealth: Payer: Self-pay | Admitting: Student

## 2020-12-20 NOTE — Telephone Encounter (Signed)
Pt c/o medication issue:  1. Name of Medication:  sacubitril-valsartan (ENTRESTO) 49-51 MG  2. How are you currently taking this medication (dosage and times per day)?   3. Are you having a reaction (difficulty breathing--STAT)?   4. What is your medication issue?  Patient's husband is calling stating [name] is needing a refill on this medication, but they are needing the assistance form filled out in regards to it. He would like a callback with the status.

## 2020-12-20 NOTE — Telephone Encounter (Signed)
Will route this message to our Prior Auth Nurse and covering RN with Dr. Marlou Porch, to further follow-up and help pt with Entresto assistance/refill.

## 2020-12-21 ENCOUNTER — Telehealth (HOSPITAL_COMMUNITY): Payer: Self-pay | Admitting: Pharmacy Technician

## 2020-12-21 NOTE — Telephone Encounter (Signed)
Advanced Heart Failure Patient Advocate Encounter  Spoke to patient regarding re-enrollment of Entresto assistance with [employer]. The patient's husband will bring application and POI. Will fax in once signatures are received.   Charlann Boxer, CPhT

## 2021-01-02 ENCOUNTER — Telehealth: Payer: Self-pay | Admitting: Diagnostic Neuroimaging

## 2021-01-02 DIAGNOSIS — G40909 Epilepsy, unspecified, not intractable, without status epilepticus: Secondary | ICD-10-CM

## 2021-01-02 NOTE — Telephone Encounter (Signed)
Contacted pt spouse back, informed him Dr Leta Baptist gave her 1 month supply with one refill on 12/07/20, she should have enough until January. He states he contacted pharmacy and they said she is out. I will contact pharmacy for verification.

## 2021-01-02 NOTE — Telephone Encounter (Signed)
Pt's husband, [name] [place] Sr. Request refill forVIMPAT 100 MG TABS at [facility]

## 2021-01-02 NOTE — Telephone Encounter (Signed)
Contacted pharmacy, spoke to [place]. She states last Rx received for Vimpat was in June, the one sent by Dr.Yan last month did not clear. please resend Rx to pharmacy.

## 2021-01-02 NOTE — Progress Notes (Deleted)
   PATIENT: [name] DOB: Apr 14, 1950  REASON FOR VISIT: follow up HISTORY FROM: patient  Virtual Visit via Telephone Note  I connected with [name] on 01/02/21 at  9:00 AM EST by telephone and verified that I am speaking with the correct person using two identifiers.   I discussed the limitations, risks, security and privacy concerns of performing an evaluation and management service by telephone and the availability of in person appointments. I also discussed with the patient that there may be a patient responsible charge related to this service. The patient expressed understanding and agreed to proceed.   History of Present Illness:  01/02/21 ALL: [name] is a 70 y.o. female here today for follow up for seizures. She continues levetiracetam 1500mg  and Vimpat (Brand) 100mg  BID.    History (copied from Dr Gladstone Lighter previous note)  UPDATE (12/30/19, VRP): Since last visit, doing well. No seizures. Tolerating meds.     PRIOR HPI: 70 year old female here for evaluation of seizure.  History of left hemisphere stroke in 2009 with resultant right-sided hemiparesis.  Patient also had seizure disorder started around 2009.  Patient was doing well until August 2020 when patient had run out of antiseizure medications and had breakthrough seizures, brought to the hospital and treated.  Patient had multiple clinical and electrographic seizures in the hospital.  Patient was maintained on levetiracetam and Vimpat and dilantin; then Dilantin was tapered off at discharge.    Since then patient is stable.  Tolerating medications.     Observations/Objective:  Generalized: Well developed, in no acute distress  Mentation: Alert oriented to time, place, history taking. Follows all commands speech and language fluent   Assessment and Plan:  70 y.o. year old female  has a past medical history of Adenomatous polyp of colon (09/2012), CHF (congestive heart failure)  ([place]), CVA (cerebral infarction) (12/2007), H/O heart bypass surgery (2022), Hemiparesis ([place]), Hyperlipidemia, Hypertension, Seizures ([place]), Sickle cell anemia ([place]), Stroke ([place]), and Vitamin D deficiency (2010). here with  No diagnosis found.  No orders of the defined types were placed in this encounter.   No orders of the defined types were placed in this encounter.    Follow Up Instructions:  I discussed the assessment and treatment plan with the patient. The patient was provided an opportunity to ask questions and all were answered. The patient agreed with the plan and demonstrated an understanding of the instructions.   The patient was advised to call back or seek an in-person evaluation if the symptoms worsen or if the condition fails to improve as anticipated.  I provided *** minutes of non-face-to-face time during this encounter. Patient located at their place of residence during [place] visit. Provider is in the office.    Debbora Presto, NP

## 2021-01-02 NOTE — Patient Instructions (Incomplete)
Below is our plan:  We will continue levetiracetam 1500mg  and Vimpat 100mg  twice daily.   Please make sure you are staying well hydrated. I recommend 50-60 ounces daily. Well balanced diet and regular exercise encouraged. Consistent sleep schedule with 6-8 hours recommended.   Please continue follow up with care team as directed.   Follow up with me in 1 year   You may receive a survey regarding today's visit. I encourage you to leave honest feed back as I do use this information to improve patient care. Thank you for seeing me today!

## 2021-01-03 ENCOUNTER — Telehealth: Payer: Medicare HMO | Admitting: Family Medicine

## 2021-01-03 DIAGNOSIS — G40909 Epilepsy, unspecified, not intractable, without status epilepticus: Secondary | ICD-10-CM

## 2021-01-03 NOTE — Telephone Encounter (Signed)
Called and spoke with pt's husband.  He has been working with Jodelle Gross in regards to this pt assist program.  He is getting all needed information together and will take it to [facility].

## 2021-01-04 ENCOUNTER — Telehealth: Payer: Self-pay | Admitting: Diagnostic Neuroimaging

## 2021-01-04 MED ORDER — LACOSAMIDE 100 MG PO TABS: 100.0000 mg | ORAL_TABLET | Freq: Two times a day (BID) | ORAL | 5 refills | Status: DC

## 2021-01-04 NOTE — Telephone Encounter (Signed)
Addressed from refill note 01/02/21

## 2021-01-04 NOTE — Telephone Encounter (Signed)
Pt's husband was called and informed that over 3 hours ago Dr Leta Baptist did call the medication into the pharmacy([facility]) for pt.  This is FYI, no call back requested

## 2021-01-04 NOTE — Telephone Encounter (Signed)
Pharmacy called again wanting update regarding this medication I sent to you  12/12, please advise

## 2021-01-04 NOTE — Telephone Encounter (Signed)
Pt's husband has called asking for an update.  He was advised CMA has messaged Dr Leta Baptist re: this matter, husband would like to be called with update when available.

## 2021-01-04 NOTE — Telephone Encounter (Signed)
Dela from [facility] called wanting to check on the update on the VIMPAT 100 MG TABS request that was sent over for the pt. Please advise.

## 2021-01-10 ENCOUNTER — Other Ambulatory Visit (HOSPITAL_COMMUNITY): Payer: Self-pay

## 2021-01-10 MED ORDER — SACUBITRIL-VALSARTAN 49-51 MG PO TABS: 1.0000 | ORAL_TABLET | Freq: Two times a day (BID) | ORAL | 3 refills | Status: DC

## 2021-01-12 NOTE — Telephone Encounter (Signed)
Sent in application via fax.  Will follow up.  

## 2021-01-13 ENCOUNTER — Other Ambulatory Visit (HOSPITAL_COMMUNITY): Payer: Self-pay | Admitting: Internal Medicine

## 2021-01-30 ENCOUNTER — Ambulatory Visit (INDEPENDENT_AMBULATORY_CARE_PROVIDER_SITE_OTHER): Payer: Medicare HMO

## 2021-01-30 DIAGNOSIS — I442 Atrioventricular block, complete: Secondary | ICD-10-CM | POA: Diagnosis not present

## 2021-01-30 LAB — CUP PACEART REMOTE DEVICE CHECK
Battery Remaining Longevity: 78 mo
Battery Remaining Percentage: 86 %
Battery Voltage: 2.99 V
Brady Statistic AP VP Percent: 43 %
Brady Statistic AP VS Percent: 1 %
Brady Statistic AS VP Percent: 52 %
Brady Statistic AS VS Percent: 1 %
Brady Statistic RA Percent Paced: 36 %
Date Time Interrogation Session: 20230109020009
Implantable Lead Implant Date: 20220411
Implantable Lead Implant Date: 20220411
Implantable Lead Implant Date: 20220411
Implantable Lead Location: 753858
Implantable Lead Location: 753859
Implantable Lead Location: 753860
Implantable Pulse Generator Implant Date: 20220411
Lead Channel Impedance Value: 410 Ohm
Lead Channel Impedance Value: 440 Ohm
Lead Channel Impedance Value: 560 Ohm
Lead Channel Pacing Threshold Amplitude: 0.75 V
Lead Channel Pacing Threshold Amplitude: 0.75 V
Lead Channel Pacing Threshold Amplitude: 1.375 V
Lead Channel Pacing Threshold Pulse Width: 0.5 ms
Lead Channel Pacing Threshold Pulse Width: 0.5 ms
Lead Channel Pacing Threshold Pulse Width: 0.5 ms
Lead Channel Sensing Intrinsic Amplitude: 3 mV
Lead Channel Sensing Intrinsic Amplitude: 6.8 mV
Lead Channel Setting Pacing Amplitude: 2 V
Lead Channel Setting Pacing Amplitude: 2 V
Lead Channel Setting Pacing Amplitude: 2.375
Lead Channel Setting Pacing Pulse Width: 0.5 ms
Lead Channel Setting Pacing Pulse Width: 0.5 ms
Lead Channel Setting Sensing Sensitivity: 4 mV
Pulse Gen Model: 3562
Pulse Gen Serial Number: 6262141

## 2021-02-02 ENCOUNTER — Other Ambulatory Visit: Payer: Self-pay | Admitting: Diagnostic Neuroimaging

## 2021-02-07 ENCOUNTER — Other Ambulatory Visit: Payer: Self-pay | Admitting: *Deleted

## 2021-02-07 MED ORDER — ATORVASTATIN CALCIUM 80 MG PO TABS: 80.0000 mg | ORAL_TABLET | Freq: Every day | ORAL | 0 refills | Status: DC

## 2021-02-07 NOTE — Telephone Encounter (Signed)
Requested refill to be sent to [facility] [address]. Needs appointment before anymore future refills.

## 2021-02-09 NOTE — Progress Notes (Signed)
Remote pacemaker transmission.   

## 2021-02-14 ENCOUNTER — Other Ambulatory Visit (HOSPITAL_COMMUNITY): Payer: Self-pay | Admitting: Cardiology

## 2021-02-28 ENCOUNTER — Encounter: Payer: Self-pay | Admitting: Cardiology

## 2021-02-28 ENCOUNTER — Other Ambulatory Visit: Payer: Self-pay

## 2021-02-28 ENCOUNTER — Ambulatory Visit: Payer: Medicare HMO | Admitting: Cardiology

## 2021-02-28 VITALS — BP 100/60 | HR 66 | Ht 66.0 in | Wt 125.0 lb

## 2021-02-28 DIAGNOSIS — I69959 Hemiplegia and hemiparesis following unspecified cerebrovascular disease affecting unspecified side: Secondary | ICD-10-CM | POA: Diagnosis not present

## 2021-02-28 DIAGNOSIS — I10 Essential (primary) hypertension: Secondary | ICD-10-CM

## 2021-02-28 DIAGNOSIS — D571 Sickle-cell disease without crisis: Secondary | ICD-10-CM | POA: Diagnosis not present

## 2021-02-28 DIAGNOSIS — I2510 Atherosclerotic heart disease of native coronary artery without angina pectoris: Secondary | ICD-10-CM

## 2021-02-28 DIAGNOSIS — Z5181 Encounter for therapeutic drug level monitoring: Secondary | ICD-10-CM

## 2021-02-28 DIAGNOSIS — I442 Atrioventricular block, complete: Secondary | ICD-10-CM

## 2021-02-28 DIAGNOSIS — I69351 Hemiplegia and hemiparesis following cerebral infarction affecting right dominant side: Secondary | ICD-10-CM | POA: Diagnosis not present

## 2021-02-28 LAB — CBC
Hematocrit: 37.2 % (ref 34.0–46.6)
Hemoglobin: 11.9 g/dL (ref 11.1–15.9)
MCH: 27.9 pg (ref 26.6–33.0)
MCHC: 32 g/dL (ref 31.5–35.7)
MCV: 87 fL (ref 79–97)
Platelets: 257 10*3/uL (ref 150–450)
RBC: 4.27 x10E6/uL (ref 3.77–5.28)
RDW: 13.1 % (ref 11.7–15.4)
WBC: 5.6 10*3/uL (ref 3.4–10.8)

## 2021-02-28 LAB — COMPREHENSIVE METABOLIC PANEL
ALT: 8 IU/L (ref 0–32)
AST: 21 IU/L (ref 0–40)
Albumin/Globulin Ratio: 1.6 (ref 1.2–2.2)
Albumin: 4.6 g/dL (ref 3.7–4.7)
Alkaline Phosphatase: 123 IU/L — ABNORMAL HIGH (ref 44–121)
BUN/Creatinine Ratio: 18 (ref 12–28)
BUN: 20 mg/dL (ref 8–27)
Bilirubin Total: 0.3 mg/dL (ref 0.0–1.2)
CO2: 24 mmol/L (ref 20–29)
Calcium: 9.4 mg/dL (ref 8.7–10.3)
Chloride: 102 mmol/L (ref 96–106)
Creatinine, Ser: 1.12 mg/dL — ABNORMAL HIGH (ref 0.57–1.00)
Globulin, Total: 2.9 g/dL (ref 1.5–4.5)
Glucose: 80 mg/dL (ref 70–99)
Potassium: 4.5 mmol/L (ref 3.5–5.2)
Sodium: 137 mmol/L (ref 134–144)
Total Protein: 7.5 g/dL (ref 6.0–8.5)
eGFR: 52 mL/min/{1.73_m2} — ABNORMAL LOW (ref >59)

## 2021-02-28 NOTE — Assessment & Plan Note (Signed)
Goal-directed medical therapy.  Wheelchair.

## 2021-02-28 NOTE — Assessment & Plan Note (Signed)
Continuing with warfarin.  Monitored by Dr. Sabra Heck.  No bleeding.

## 2021-02-28 NOTE — Progress Notes (Signed)
Cardiology Office Note:    Date:  02/28/2021   ID:  [name], [name] 11/25/1950, MRN [id number]  PCP:  [name], MD   [facility] Providers Cardiologist:  Candee Furbish, MD Electrophysiologist:  Will Meredith Leeds, MD     Referring MD: [name], MD    History of Present Illness:    [name] is a 71 y.o. female here for follow-up chronic systolic heart failure CAD complete heart block.  In August 2020 had a seizure after she was out of her medications for 4 days required intubation troponin was 1900.  Echo showed EF of 35 to 40% and elevated left atrial pressures.  She had moderate aortic regurgitation.  Stress test was performed in 2020 that showed a large severe fixed inferior inferolateral perfusion defect suggestive of scar.  High risk.  No reversible ischemia.  It was felt like she was not a good cardiac catheterization candidate in that hospitalization.  In March 2022 she had a syncopal episode and was found to be in complete heart block.  This improved after stopping initially nodal blockers.  Left heart catheterization at that time showed significant left main and left circumflex disease.  Poor candidate for PCI.  She underwent CABG in April 2022 with LIMA to LAD and SVG to OM 2.  Postop again she developed complete heart block and a Saint Jude CRT pacemaker was placed.  Her husband helps her significantly, she is aphasic.  Stroke in 4270 felt to be embolic.  Longstanding aphasia with right hemiparesis continuing warfarin.  He provides historical details.  No chest pain.  Does some walking.  Walker in the house.  Wheelchair outside the house.  Saint Jude interrogation showed 95% BiV pacing no atrial fibrillation.  She has been going to the advanced heart failure clinic, Dr. Aundra Dubin.  Goal-directed medical therapy advanced.  Entresto, spironolactone Toprol Lasix low-dose.  On warfarin.  Overall doing quite well.  Aphasia noted.  Hemiparesis  noted.  In wheelchair.  Her husband helps with some of the translation.  She is not having any significant shortness of breath.  She does have a soft diet.  1. H/o seizure disorder.  2. CVA: 2009 with aphasia and right hemiparesis.  3. Complete heart block: St Jude CRT-P. 4. Chronic systolic CHF: Ischemic cardiomyopathy. St Jude CRT-P device.  - Echo (3/22): EF 35-40%, inferior and lateral HK, normal RV.  - Coronary angiography (4/22): 60-70% dLM, 60-70% ostial LCx, 99% proximal calcified LCx (LCx was large, dominant vessel).  - Echo (9/22): EF 35% with basal to mid inferior and inferolateral AK, mildly decreased RV function, mild-moderate MR. 5. CAD:  Coronary angiography (4/22) with 60-70% dLM, 60-70% ostial LCx, 99% proximal calcified LCx (LCx was large, dominant vessel). - CABG x 4 in 4/22: LIMA-LAD, SVG-OM2.  6. CHB: St Jude CRT-P placed in 4/22.   Past Medical History:  Diagnosis Date   Adenomatous polyp of colon 09/2012   repeat colonoscopy in 5 years by Dr. Sharlett Iles   CHF (congestive heart failure) ([place])    EF 15-20% as of 05/28/11 (Dr Legrand [facility] summary)   CVA (cerebral infarction) 12/2007   H/O heart bypass surgery 2022   Hemiparesis ([place])    Hyperlipidemia    Hypertension    Seizures ([place])    Sickle cell anemia ([place])    Stroke ([place])    Vitamin D deficiency 2010    Past Surgical History:  Procedure Laterality Date   ABDOMINAL HYSTERECTOMY  BIV PACEMAKER INSERTION CRT-P N/A 04/29/2020   Procedure: BIV PACEMAKER INSERTION CRT-P;  Surgeon: Constance Haw, MD;  Location: [facility];  Service: Cardiovascular;  Laterality: N/A;   CORONARY ARTERY BYPASS GRAFT N/A 04/25/2020   Procedure: CORONARY ARTERY BYPASS GRAFTING (CABG) TIMES TWO USING LEFT INTERNAL MAMMARY ARTERY AND RIGHT GREATER SAPHENOUS VEIN HARVESTED ENDOSCOPICALLY;  Surgeon: Melrose Nakayama, MD;  Location: [facility];  Service: Open Heart Surgery;  Laterality: N/A;   FRACTURE SURGERY      HIP SURGERY     LEFT HEART CATH AND CORONARY ANGIOGRAPHY N/A 04/22/2020   Procedure: LEFT HEART CATH AND CORONARY ANGIOGRAPHY;  Surgeon: Larey Dresser, MD;  Location: [facility];  Service: Cardiovascular;  Laterality: N/A;   TEE WITHOUT CARDIOVERSION  04/25/2020   Procedure: TRANSESOPHAGEAL ECHOCARDIOGRAM (TEE);  Surgeon: Melrose Nakayama, MD;  Location: [facility] OR;  Service: Open Heart Surgery;;   TEMPORARY PACEMAKER N/A 04/21/2020   Procedure: TEMPORARY PACEMAKER;  Surgeon: Larey Dresser, MD;  Location: [facility];  Service: Cardiovascular;  Laterality: N/A;   TUBAL LIGATION      Current Medications: Current Meds  Medication Sig   acetaminophen (TYLENOL) 325 MG tablet Take 650 mg by mouth 2 (two) times a week.   aspirin 81 MG chewable tablet Chew 1 tablet (81 mg total) by mouth daily.   atorvastatin (LIPITOR) 80 MG tablet Take 1 tablet (80 mg total) by mouth at bedtime. Needs appointment before anymore future refills.   Cholecalciferol 50 MCG (2000 UT) TABS Take 1 tablet by mouth daily in the afternoon.   furosemide (LASIX) 20 MG tablet TAKE 1 TABLET(20 MG) BY MOUTH EVERY OTHER DAY   JARDIANCE 10 MG TABS tablet TAKE 1 TABLET(10 MG) BY MOUTH DAILY BEFORE AND BREAKFAST   Lacosamide (VIMPAT) 100 MG TABS Take 1 tablet (100 mg total) by mouth in the morning and at bedtime.   levETIRAcetam (KEPPRA) 750 MG tablet TAKE 2 TABLETS TWICE DAILY   metoprolol succinate (TOPROL-XL) 50 MG 24 hr tablet Take 1 tablet (50 mg total) by mouth daily. Take with or immediately following a meal.   Nutritional Supplements (NUTRITIONAL SUPPLEMENT PO) Take 120 mLs by mouth once. Medpass for weight gain.   sacubitril-valsartan (ENTRESTO) 49-51 MG Take 1 tablet by mouth 2 (two) times daily.   spironolactone (ALDACTONE) 25 MG tablet Take 1 tablet (25 mg total) by mouth daily.   warfarin (COUMADIN) 5 MG tablet Take 5 mg by mouth daily.     Allergies:   Lexiscan [regadenoson]   Social History    Socioeconomic History   Marital status: Married    Spouse name: [name]   Number of children: 2   Years of education: 12   Highest education level: Not on file  Occupational History    Employer: RETIRED  Tobacco Use   Smoking status: Former    Types: Cigarettes    Quit date: 09/26/2007    Years since quitting: 13.4   Smokeless tobacco: Never  Vaping Use   Vaping Use: Never used  Substance and Sexual Activity   Alcohol use: Not Currently    Comment: Former drinker, 15 years ago   Drug use: Never   Sexual activity: Not Currently  Other Topics Concern   Not on file  Social History Narrative   Tobacco use, amount per day now: None.   Past tobacco use, amount per day: Everyday.   How many years did you use tobacco: 30 years.  Alcohol use (drinks per week): Wine Daily   Diet:   Do you drink/eat things with caffeine: Yes   Marital status: Married                                  What year were you married?    Do you live in a house, apartment, assisted living, condo, trailer, etc.? House   Is it one or more stories? 1 story.   How many persons live in your home? 2   Do you have pets in your home?( please list) No   Highest Level of education completed? Associate   Current or past profession: Director    Do you exercise?    No                             Type and how often?   Do you have a living will?    Do you have a DNR form?                                   If not, do you want to discuss one?   Do you have signed POA/HPOA forms?                        If so, please bring to you appointment      Do you have any difficulty bathing or dressing yourself? Yes   Do you have any difficulty preparing food or eating? Yes   Do you have any difficulty managing your medications? Yes   Do you have any difficulty managing your finances? Yes   Do you have any difficulty affording your medications? Yes   Social Determinants of Health   Financial Resource Strain: Not on file  Food  Insecurity: Not on file  Transportation Needs: Not on file  Physical Activity: Not on file  Stress: Not on file  Social Connections: Not on file     Family History: The patient's family history includes Asthma in her brother; Bronchitis in her brother; Colon cancer in her maternal grandfather; Diabetes in her daughter; Heart disease in her daughter; High Cholesterol in her daughter and sister; Hypertension in her daughter and sister.  ROS:   Please see the history of present illness.     All other systems reviewed and are negative.  EKGs/Labs/Other Studies Reviewed:    The following studies were reviewed today: Hospitalization, echocardiogram, cardiac catheterization personally reviewed and interpreted.  EKG: No new  Recent Labs: 08/09/2020: ALT 22; Hemoglobin 13.3; Magnesium 2.1; Platelets 261 10/21/2020: B Natriuretic Peptide 1,309.6 12/02/2020: BUN 21; Creatinine, Ser 1.05; Potassium 4.2; Sodium 136  Recent Lipid Panel    Component Value Date/Time   CHOL 132 10/21/2020 1020   CHOL 120 10/08/2019 1031   TRIG 61 10/21/2020 1020   HDL 53 10/21/2020 1020   HDL 39 (L) 10/08/2019 1031   CHOLHDL 2.5 10/21/2020 1020   VLDL 12 10/21/2020 1020   LDLCALC 67 10/21/2020 1020   LDLCALC 61 10/08/2019 1031     Risk Assessment/Calculations:              Physical Exam:    VS:  BP 100/60 (BP Location: Left Arm, Patient Position: Sitting, Cuff Size: Normal)    Pulse 66  Ht 5\' 6"  (1.676 m)    Wt 125 lb (56.7 kg)    SpO2 97%    BMI 20.18 kg/m     Wt Readings from Last 3 Encounters:  02/28/21 125 lb (56.7 kg)  10/21/20 125 lb (56.7 kg)  08/25/20 120 lb (54.4 kg)     GEN: Thin, in wheelchair in no acute distress HEENT: Normal NECK: No JVD; No carotid bruits LYMPHATICS: No lymphadenopathy CARDIAC: RRR, no murmurs, no rubs, gallops RESPIRATORY:  Clear to auscultation without rales, wheezing or rhonchi  ABDOMEN: Soft, non-tender, non-distended MUSCULOSKELETAL:  No edema; No  deformity  SKIN: Warm and dry NEUROLOGIC:  Alert hemiparesis noted right side.  Aphasic. PSYCHIATRIC:  Normal affect   ASSESSMENT:    1. Therapeutic drug monitoring   2. Essential hypertension   3. Complete heart block ([place])   4. Coronary artery disease involving native coronary artery of native heart without angina pectoris   5. Hemiplegia of dominant side as late effect following cerebrovascular disease (HCC)    PLAN:    In order of problems listed above:  Chronic combined systolic and diastolic heart failure ([place]) Goal-directed medical therapy noted.  Excellent approach with beta-blocker and Entresto Jardiance Lasix spironolactone.  Checking lab work today.  NYHA class I-II.  Saint Jude CRT-P.  Continue to watch fluid, salt.  (L) MCA cardio embolic stroke  Continuing with warfarin.  Monitored by Dr. Sabra Heck.  No bleeding.  Complete heart block ([place]) [place] pacemaker in place.  Doing well.  Biventricular synchronization.  No atrial fibrillation monitoring checked.  Coronary artery disease Prior bypass.  April 2022.  No anginal symptoms.  Goal-directed medical therapy.  On aspirin  Hemiplegia of dominant side as late effect following cerebrovascular disease Goal-directed medical therapy.  Wheelchair.         Medication Adjustments/Labs and Tests Ordered: Current medicines are reviewed at length with the patient today.  Concerns regarding medicines are outlined above.  Orders Placed This Encounter  Procedures   Comprehensive Metabolic Panel (CMET)   CBC   No orders of the defined types were placed in this encounter.   Patient Instructions  Medication Instructions:  Your physician recommends that you continue on your current medications as directed. Please refer to the Current Medication list given to you today. *If you need a refill on your cardiac medications before your next appointment, please call your pharmacy*  Lab Work: Your physician recommends that you  return for lab work [id number], CBC If you have labs (blood work) drawn today and your tests are completely normal, you will receive your results only by: MyChart Message (if you have MyChart) OR A paper copy in the mail If you have any lab test that is abnormal or we need to change your treatment, we will call you to review the results.  Testing/Procedures: NONE ORDERED  Follow-Up: At [facility], you and your health needs are our priority.  As part of our continuing mission to provide you with exceptional heart care, we have created designated Provider Care Teams.  These Care Teams include your primary Cardiologist (physician) and Advanced Practice Providers (APPs -  Physician Assistants and Nurse Practitioners) who all work together to provide you with the care you need, when you need it.  We recommend signing up for the patient portal called "MyChart".  Sign up information is provided on this After Visit Summary.  MyChart is used to connect with patients for Virtual Visits (Telemedicine).  Patients are able to view  lab/test results, encounter notes, upcoming appointments, etc.  Non-urgent messages can be sent to your provider as well.   To learn more about what you can do with MyChart, go to [email or web address].    Your next appointment:   6 month(s)  The format for your next appointment:   In Person  Provider:   Robbie Lis, PA-C, Nicholes Rough, PA-C, Melina Copa, PA-C, Ambrose Pancoast, NP, Cecilie Kicks, NP, Ermalinda Barrios, PA-C, Christen Bame, NP, Richardson Dopp, PA-C, or Margie Billet, NP       Other Instructions     Signed, Candee Furbish, MD  02/28/2021 10:53 AM    [place]

## 2021-02-28 NOTE — Assessment & Plan Note (Signed)
Prior bypass.  April 2022.  No anginal symptoms.  Goal-directed medical therapy.  On aspirin

## 2021-02-28 NOTE — Assessment & Plan Note (Signed)
Goal-directed medical therapy noted.  Excellent approach with beta-blocker and Entresto Jardiance Lasix spironolactone.  Checking lab work today.  NYHA class I-II.  Saint Jude CRT-P.  Continue to watch fluid, salt.

## 2021-02-28 NOTE — Assessment & Plan Note (Signed)
[place] pacemaker in place.  Doing well.  Biventricular synchronization.  No atrial fibrillation monitoring checked.

## 2021-02-28 NOTE — Patient Instructions (Signed)
Medication Instructions:  Your physician recommends that you continue on your current medications as directed. Please refer to the Current Medication list given to you today. *If you need a refill on your cardiac medications before your next appointment, please call your pharmacy*  Lab Work: Your physician recommends that you return for lab work [id number], CBC If you have labs (blood work) drawn today and your tests are completely normal, you will receive your results only by: MyChart Message (if you have MyChart) OR A paper copy in the mail If you have any lab test that is abnormal or we need to change your treatment, we will call you to review the results.  Testing/Procedures: NONE ORDERED  Follow-Up: At [facility], you and your health needs are our priority.  As part of our continuing mission to provide you with exceptional heart care, we have created designated Provider Care Teams.  These Care Teams include your primary Cardiologist (physician) and Advanced Practice Providers (APPs -  Physician Assistants and Nurse Practitioners) who all work together to provide you with the care you need, when you need it.  We recommend signing up for the patient portal called "MyChart".  Sign up information is provided on this After Visit Summary.  MyChart is used to connect with patients for Virtual Visits (Telemedicine).  Patients are able to view lab/test results, encounter notes, upcoming appointments, etc.  Non-urgent messages can be sent to your provider as well.   To learn more about what you can do with MyChart, go to [email or web address].    Your next appointment:   6 month(s)  The format for your next appointment:   In Person  Provider:   Robbie Lis, PA-C, Nicholes Rough, PA-C, Melina Copa, PA-C, Ambrose Pancoast, NP, Cecilie Kicks, NP, Ermalinda Barrios, PA-C, Christen Bame, NP, Richardson Dopp, PA-C, or Margie Billet, NP       Other Instructions

## 2021-03-13 ENCOUNTER — Other Ambulatory Visit: Payer: Self-pay

## 2021-03-13 ENCOUNTER — Ambulatory Visit (HOSPITAL_COMMUNITY)
Admission: RE | Admit: 2021-03-13 | Discharge: 2021-03-13 | Disposition: A | Discharge status: Home / Self Care | Payer: Medicare HMO | Source: Ambulatory Visit | Attending: Cardiology | Admitting: Cardiology

## 2021-03-13 ENCOUNTER — Encounter (HOSPITAL_COMMUNITY): Payer: Self-pay | Admitting: Cardiology

## 2021-03-13 VITALS — BP 110/60 | HR 64 | Ht 67.0 in | Wt 125.0 lb

## 2021-03-13 DIAGNOSIS — Z8673 Personal history of transient ischemic attack (TIA), and cerebral infarction without residual deficits: Secondary | ICD-10-CM | POA: Diagnosis not present

## 2021-03-13 DIAGNOSIS — I2510 Atherosclerotic heart disease of native coronary artery without angina pectoris: Secondary | ICD-10-CM | POA: Insufficient documentation

## 2021-03-13 DIAGNOSIS — I442 Atrioventricular block, complete: Secondary | ICD-10-CM | POA: Insufficient documentation

## 2021-03-13 DIAGNOSIS — I255 Ischemic cardiomyopathy: Secondary | ICD-10-CM | POA: Insufficient documentation

## 2021-03-13 DIAGNOSIS — G40909 Epilepsy, unspecified, not intractable, without status epilepticus: Secondary | ICD-10-CM | POA: Diagnosis not present

## 2021-03-13 DIAGNOSIS — I25810 Atherosclerosis of coronary artery bypass graft(s) without angina pectoris: Secondary | ICD-10-CM

## 2021-03-13 DIAGNOSIS — Z7901 Long term (current) use of anticoagulants: Secondary | ICD-10-CM | POA: Diagnosis not present

## 2021-03-13 DIAGNOSIS — Z7982 Long term (current) use of aspirin: Secondary | ICD-10-CM | POA: Insufficient documentation

## 2021-03-13 DIAGNOSIS — Z950 Presence of cardiac pacemaker: Secondary | ICD-10-CM | POA: Diagnosis not present

## 2021-03-13 DIAGNOSIS — I5022 Chronic systolic (congestive) heart failure: Secondary | ICD-10-CM | POA: Diagnosis not present

## 2021-03-13 DIAGNOSIS — Z951 Presence of aortocoronary bypass graft: Secondary | ICD-10-CM | POA: Insufficient documentation

## 2021-03-13 DIAGNOSIS — I452 Bifascicular block: Secondary | ICD-10-CM | POA: Diagnosis not present

## 2021-03-13 DIAGNOSIS — I5042 Chronic combined systolic (congestive) and diastolic (congestive) heart failure: Secondary | ICD-10-CM

## 2021-03-13 DIAGNOSIS — Z7984 Long term (current) use of oral hypoglycemic drugs: Secondary | ICD-10-CM | POA: Diagnosis not present

## 2021-03-13 DIAGNOSIS — Z79899 Other long term (current) drug therapy: Secondary | ICD-10-CM | POA: Insufficient documentation

## 2021-03-13 MED ORDER — ENTRESTO 97-103 MG PO TABS: 1.0000 | ORAL_TABLET | Freq: Two times a day (BID) | ORAL | 11 refills | Status: DC

## 2021-03-13 NOTE — Progress Notes (Signed)
PCP: [name], MD HF Cardiology: Dr. Aundra Dubin  71 y.o. with history of CVA, chronic systolic CHF, CHB, and CAD returns for followup of CHF.   She had a stroke in 2009, thought to be cardioembolic.  She has had right hemiparesis and aphasia since that time.   [name] was hospitalized in August 2020 with a seizure after being out of her medications for 4 days.  She required intubation.  High-sensitivity troponin trended to 1905. An echocardiogram 08/2018 revealed EF of 35 to 40% with global hypokinesis and moderate LVH, elevated LVEDP.  She also had moderate AR. Nuclear stress test in August 2020 showed a large severe fixed inferior, anterior apical, and inferolateral perfusion defect suggestive of scar without ischemia.  Study was high risk due to EF estimated at 34% but mentions no significant reversible ischemia. She was deemed not a good candidate for cardiac catheterization during that hospitalization.    Patient was admitted in 3/22 following a syncopal episode and was found to be in complete heart block.  This improved initially after stopping nodal blockers.  Echo in 3/22 showed EF 35-40%.  LHC showed significant left main and LCx disease.  She was deemed to be a poor candidate for PCI.  In 4/22, she had CABG with LIMA-LAD and SVG-OM2.  Post-op, she developed CHB again.  St Jude CRT-P device was placed. She was discharged to a rehab facility.   Echo in 9/22 showed EF 35% with basal to mid inferior and inferolateral AK, mildly decreased RV function, mild-moderate MR.     She is here with her husband who helps with history due to aphasia. She has been doing well generally.  Able to walk around her house without dyspnea and do chores such as washing dishes.  Not very active due to right-sided weakness.  Uses wheelchair to get in the office.  No chest pain.  No lightheadedness.  No orthopnea/PND.  Has multiple decayed teeth that need to be removed.   Labs (4/22): K 3.9, creatinine 0.83, hgb  9.4 Labs (7/22): K 4.2, creatinine 0.93 Labs (9/22): LDL 67 Labs (2/23): K 4.5, creatinine 1.12  ECG (personally reviewed): NSR, BiV paced  PMH: 1. H/o seizure disorder.  2. CVA: 2009 with aphasia and right hemiparesis.  3. Complete heart block: St Jude CRT-P. 4. Chronic systolic CHF: Ischemic cardiomyopathy. St Jude CRT-P device.  - Echo (3/22): EF 35-40%, inferior and lateral HK, normal RV.  - Coronary angiography (4/22): 60-70% dLM, 60-70% ostial LCx, 99% proximal calcified LCx (LCx was large, dominant vessel).  - Echo (9/22): EF 35% with basal to mid inferior and inferolateral AK, mildly decreased RV function, mild-moderate MR. 5. CAD:  Coronary angiography (4/22) with 60-70% dLM, 60-70% ostial LCx, 99% proximal calcified LCx (LCx was large, dominant vessel). - CABG x 4 in 4/22: LIMA-LAD, SVG-OM2.  6. CHB: St Jude CRT-P placed in 4/22.   Social History   Socioeconomic History   Marital status: Married    Spouse name: [name]   Number of children: 2   Years of education: 12   Highest education level: Not on file  Occupational History    Employer: RETIRED  Tobacco Use   Smoking status: Former    Types: Cigarettes    Quit date: 09/26/2007    Years since quitting: 13.4   Smokeless tobacco: Never  Vaping Use   Vaping Use: Never used  Substance and Sexual Activity   Alcohol use: Not Currently    Comment: Former drinker,  15 years ago   Drug use: Never   Sexual activity: Not Currently  Other Topics Concern   Not on file  Social History Narrative   Tobacco use, amount per day now: None.   Past tobacco use, amount per day: Everyday.   How many years did you use tobacco: 30 years.   Alcohol use (drinks per week): Wine Daily   Diet:   Do you drink/eat things with caffeine: Yes   Marital status: Married                                  What year were you married?    Do you live in a house, apartment, assisted living, condo, trailer, etc.? House   Is it one or more stories? 1  story.   How many persons live in your home? 2   Do you have pets in your home?( please list) No   Highest Level of education completed? Associate   Current or past profession: Director    Do you exercise?    No                             Type and how often?   Do you have a living will?    Do you have a DNR form?                                   If not, do you want to discuss one?   Do you have signed POA/HPOA forms?                        If so, please bring to you appointment      Do you have any difficulty bathing or dressing yourself? Yes   Do you have any difficulty preparing food or eating? Yes   Do you have any difficulty managing your medications? Yes   Do you have any difficulty managing your finances? Yes   Do you have any difficulty affording your medications? Yes   Social Determinants of Health   Financial Resource Strain: Not on file  Food Insecurity: Not on file  Transportation Needs: Not on file  Physical Activity: Not on file  Stress: Not on file  Social Connections: Not on file  Intimate Partner Violence: Not on file   Family History  Problem Relation Age of Onset   Diabetes Daughter    High Cholesterol Daughter    Hypertension Daughter    Heart disease Daughter    Hypertension Sister    High Cholesterol Sister    Asthma Brother    Bronchitis Brother    Colon cancer Maternal Grandfather    ROS: All systems reviewed and negative except as per HPI.   Current Outpatient Medications  Medication Sig Dispense Refill   acetaminophen (TYLENOL) 325 MG tablet Take 650 mg by mouth 2 (two) times a week.     aspirin 81 MG chewable tablet Chew 1 tablet (81 mg total) by mouth daily. 30 tablet 6   atorvastatin (LIPITOR) 80 MG tablet Take 1 tablet (80 mg total) by mouth at bedtime. Needs appointment before anymore future refills. 90 tablet 0   Cholecalciferol 50 MCG (2000 UT) TABS Take 1 tablet by mouth daily in the afternoon.  furosemide (LASIX) 20 MG tablet TAKE 1  TABLET(20 MG) BY MOUTH EVERY OTHER DAY 30 tablet 1   JARDIANCE 10 MG TABS tablet TAKE 1 TABLET(10 MG) BY MOUTH DAILY BEFORE AND BREAKFAST 30 tablet 8   Lacosamide (VIMPAT) 100 MG TABS Take 1 tablet (100 mg total) by mouth in the morning and at bedtime. 60 tablet 5   levETIRAcetam (KEPPRA) 750 MG tablet TAKE 2 TABLETS TWICE DAILY 360 tablet 4   metoprolol succinate (TOPROL-XL) 50 MG 24 hr tablet Take 1 tablet (50 mg total) by mouth daily. Take with or immediately following a meal. 90 tablet 3   Nutritional Supplements (NUTRITIONAL SUPPLEMENT PO) Take 120 mLs by mouth once. Medpass for weight gain.     sacubitril-valsartan (ENTRESTO) 97-103 MG Take 1 tablet by mouth 2 (two) times daily. 60 tablet 11   spironolactone (ALDACTONE) 25 MG tablet Take 1 tablet (25 mg total) by mouth daily. 45 tablet 3   warfarin (COUMADIN) 5 MG tablet Take 5 mg by mouth daily.     No current facility-administered medications for this encounter.   BP 110/60    Pulse 64    Ht 5\' 7"  (1.702 m)    Wt 56.7 kg (125 lb)    SpO2 99%    BMI 19.58 kg/m  General: NAD Neck: No JVD, no thyromegaly or thyroid nodule.  Lungs: Clear to auscultation bilaterally with normal respiratory effort. CV: Nondisplaced PMI.  Heart regular S1/S2, no S3/S4, no murmur.  No peripheral edema.  No carotid bruit.  Normal pedal pulses.  Abdomen: Soft, nontender, no hepatosplenomegaly, no distention.  Skin: Intact without lesions or rashes.  Neurologic: Alert and oriented x 3.  Psych: Normal affect. Extremities: No clubbing or cyanosis.  HEENT: Normal.   Asssessment/Plan: 1. Complete heart block: Patient had baseline NSR with LAFB/RBBB, so there was underlying conduction system disease.  She was transiently in CHB pre-CABG and developed persistent CHB post-CABG.  She now has Research officer[employer] CRT-P device.   2. Chronic systolic CHF: Ischemic cardiomyopathy.  Echo in 8/20 with EF 35-40% and wall motion abnormalities. LHC in 4/22 showed left main/severe  codominant LCx disease.  Echo in 3/22 with EF 35-40%, inferior and lateral hypokinesis.  Echo in 9/22 with EF 35%, mild RV dysfunction.  Now s/p CABG.  Probably NYHA class II.  She is not volume overloaded on exam.    - Increase Entresto to 97/103 bid.  BMET today and in 10 days.  - Continue spironolactone 25 mg daily.    - Continue Toprol XL 50 mg daily. - Continue Jardiance 10 mg daily.   - Continue Lasix 20 mg every other day.  3. 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  Has been on warfarin.  Has had long-standing aphasia and right hemiparesis.   - Continue warfarin.    4. H/o seizure disorder: Continue Keppra.  5. CAD:  LHC showed 60-70% distal left main stenosis, heavily calcified proximal LCx with 60-70% stenosis at the ostium and discrete 99% stenosis in the proximal LCx.  I suspect that her CHB is related to chronic ischemia from LM-LCx disease worsened by nodal blockade. Had CABG x 2 4/22 w/ LIMA-LAD, SVG-OM2.  No chest pain.  - Continue ASA 81 daily.  - Continue statin, lipids ok in 9/22.   Followup in 3 months with APP.   Loralie Champagne 03/13/2021

## 2021-03-13 NOTE — Progress Notes (Signed)
CSW consulted because pt needing extractions but was told by previous dental providers that it would not be covered by insurance.  CSW met with pt and pt spouse who report they looked into this last year and was told the above.  CSW assisted pt in calling insurance and spoke with representative who confirm that extractions would not be covered by pt current benefit.  Inquired if there was a way to have it certified as a medical necessity as it is affecting her heart health but they state there is no way to do this.  Insurance rep states that they could add on an oral surgery benefit which would cover this- pt and pt spouse will plan to call insurance back when they get home to discuss oral surgery benefit with insurance.  CSW also provided pt and spouse with list of local dental options for sliding scale extractions that they can look into if adding insurance coverage will be an issue.  Will continue to follow and assist assist as needed  Jorge Ny, [facility] Desk#: [phone] Cell#: [phone]

## 2021-03-13 NOTE — Patient Instructions (Signed)
Medication Changes:  Stop entresto 49/51  Start Delene Loll 97/013  Lab Work:  Lab work in 10 days  Testing/Procedures:  none  Referrals:  none  Special Instructions // Education:  none  Follow-Up in: 3 months  At the [facility], you and your health needs are our priority. We have a designated team specialized in the treatment of Heart Failure. This Care Team includes your primary Heart Failure Specialized Cardiologist (physician), Advanced Practice Providers (APPs- Physician Assistants and Nurse Practitioners), and Pharmacist who all work together to provide you with the care you need, when you need it.   You may see any of the following providers on your designated Care Team at your next follow up:  Dr Glori Bickers Dr Haynes Kerns, NP Lyda Jester, [place] [facility] [place], [place] Audry Riles, PharmD   Please be sure to bring in all your medications bottles to every appointment.   Need to Contact us:  If you have any questions or concerns before your next appointment please send [place] a message through [place] or call our office at [phone].    TO LEAVE A MESSAGE FOR THE NURSE SELECT OPTION 2, PLEASE LEAVE A MESSAGE INCLUDING: YOUR NAME DATE OF BIRTH CALL BACK NUMBER REASON FOR CALL**this is important as we prioritize the call backs  YOU WILL RECEIVE A CALL BACK THE SAME DAY AS LONG AS YOU CALL BEFORE 4:00 PM

## 2021-03-24 ENCOUNTER — Ambulatory Visit (HOSPITAL_COMMUNITY)
Admission: RE | Admit: 2021-03-24 | Discharge: 2021-03-24 | Disposition: A | Discharge status: Home / Self Care | Payer: Medicare HMO | Source: Ambulatory Visit | Attending: Internal Medicine | Admitting: Internal Medicine

## 2021-03-24 ENCOUNTER — Other Ambulatory Visit: Payer: Self-pay

## 2021-03-24 DIAGNOSIS — I5042 Chronic combined systolic (congestive) and diastolic (congestive) heart failure: Secondary | ICD-10-CM | POA: Insufficient documentation

## 2021-03-24 LAB — BASIC METABOLIC PANEL
Anion gap: 8 (ref 5–15)
BUN: 19 mg/dL (ref 8–23)
CO2: 24 mmol/L (ref 22–32)
Calcium: 9.1 mg/dL (ref 8.9–10.3)
Chloride: 106 mmol/L (ref 98–111)
Creatinine, Ser: 0.96 mg/dL (ref 0.44–1.00)
GFR, Estimated: >60 mL/min (ref >60)
Glucose, Bld: 87 mg/dL (ref 70–99)
Potassium: 4.5 mmol/L (ref 3.5–5.1)
Sodium: 138 mmol/L (ref 135–145)

## 2021-04-15 ENCOUNTER — Other Ambulatory Visit (HOSPITAL_COMMUNITY): Payer: Self-pay | Admitting: Cardiology

## 2021-04-25 IMAGING — MG MM DIGITAL DIAGNOSTIC UNILAT*L* W/ TOMO W/ CAD
4 series · 4 of 12 positions shown · non-contrast
Comparison: Previous exam(s).

CLINICAL DATA: Recall from screening mammography with
tomosynthesis, possible asymmetry in the UPPER LEFT breast at MIDDLE
to POSTERIOR depth visible only on the MLO images.

EXAM:
DIGITAL DIAGNOSTIC UNILATERAL LEFT MAMMOGRAM WITH TOMO AND CAD

[L LM synth-2D]
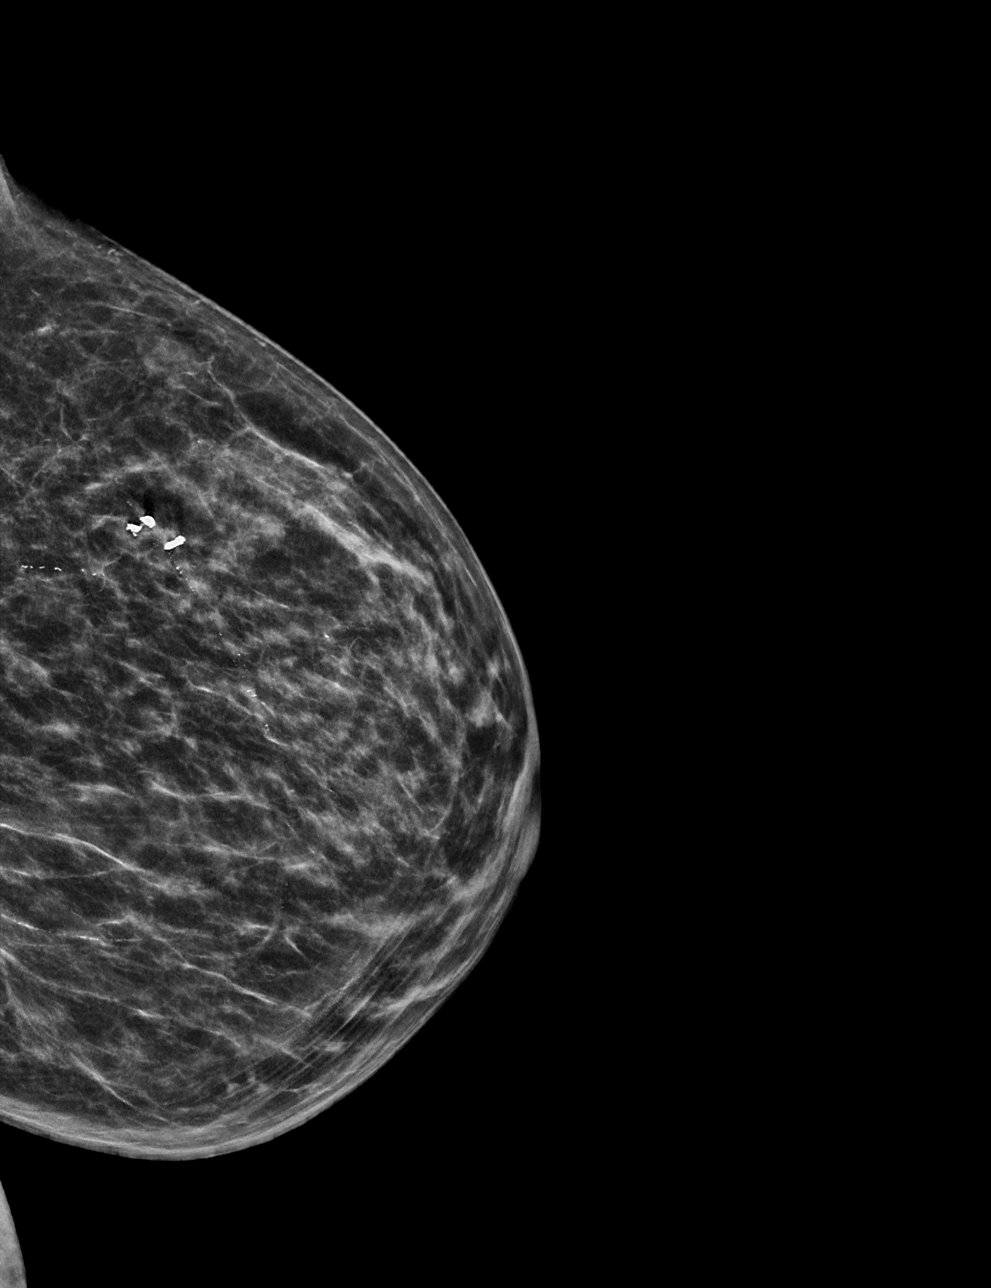

[L MLO synth-2D]
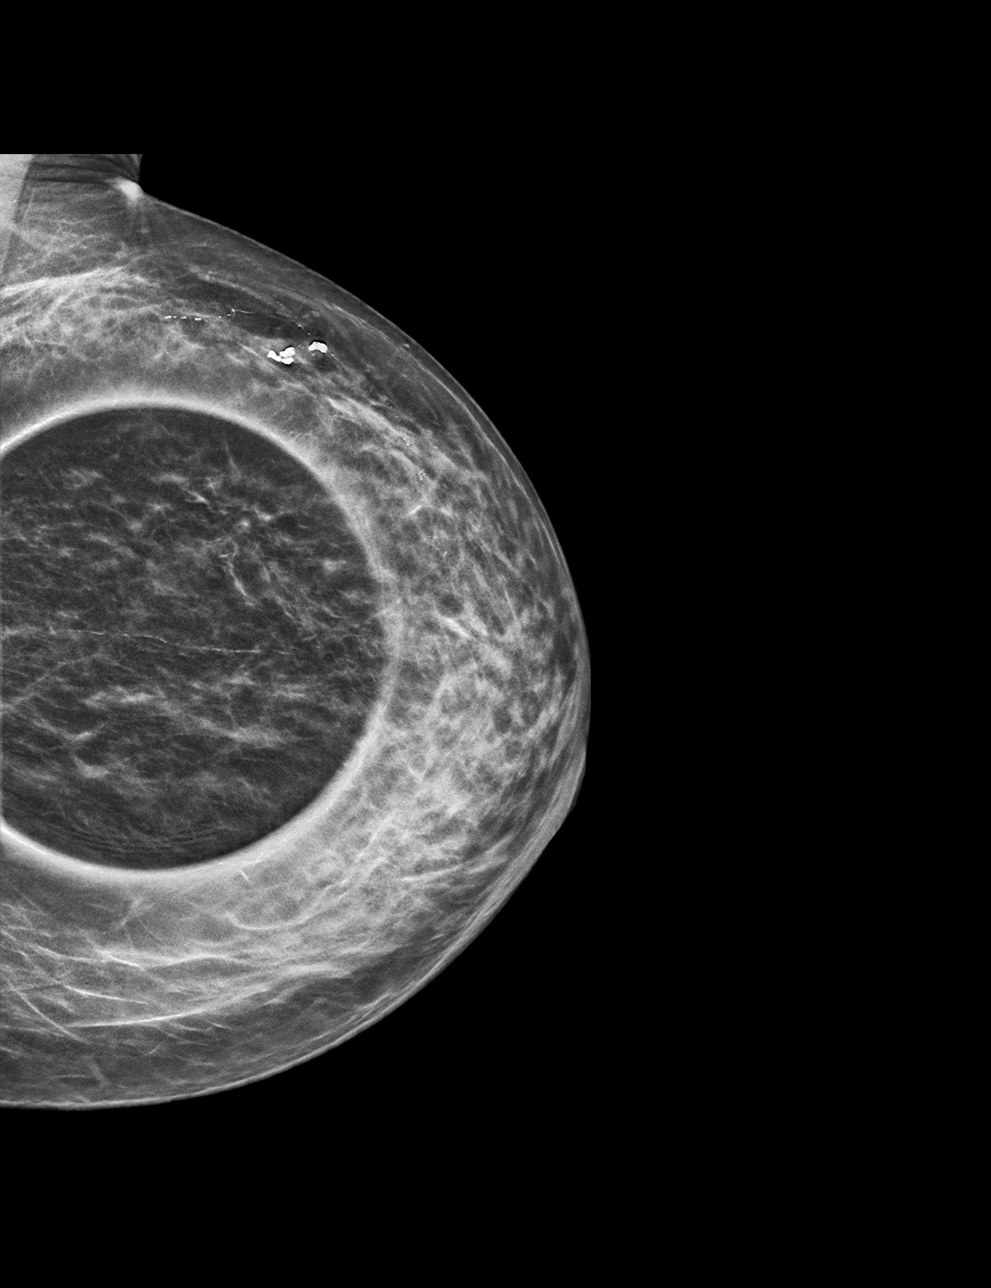

[L MLO tomo · tomo slice 22/43.0]
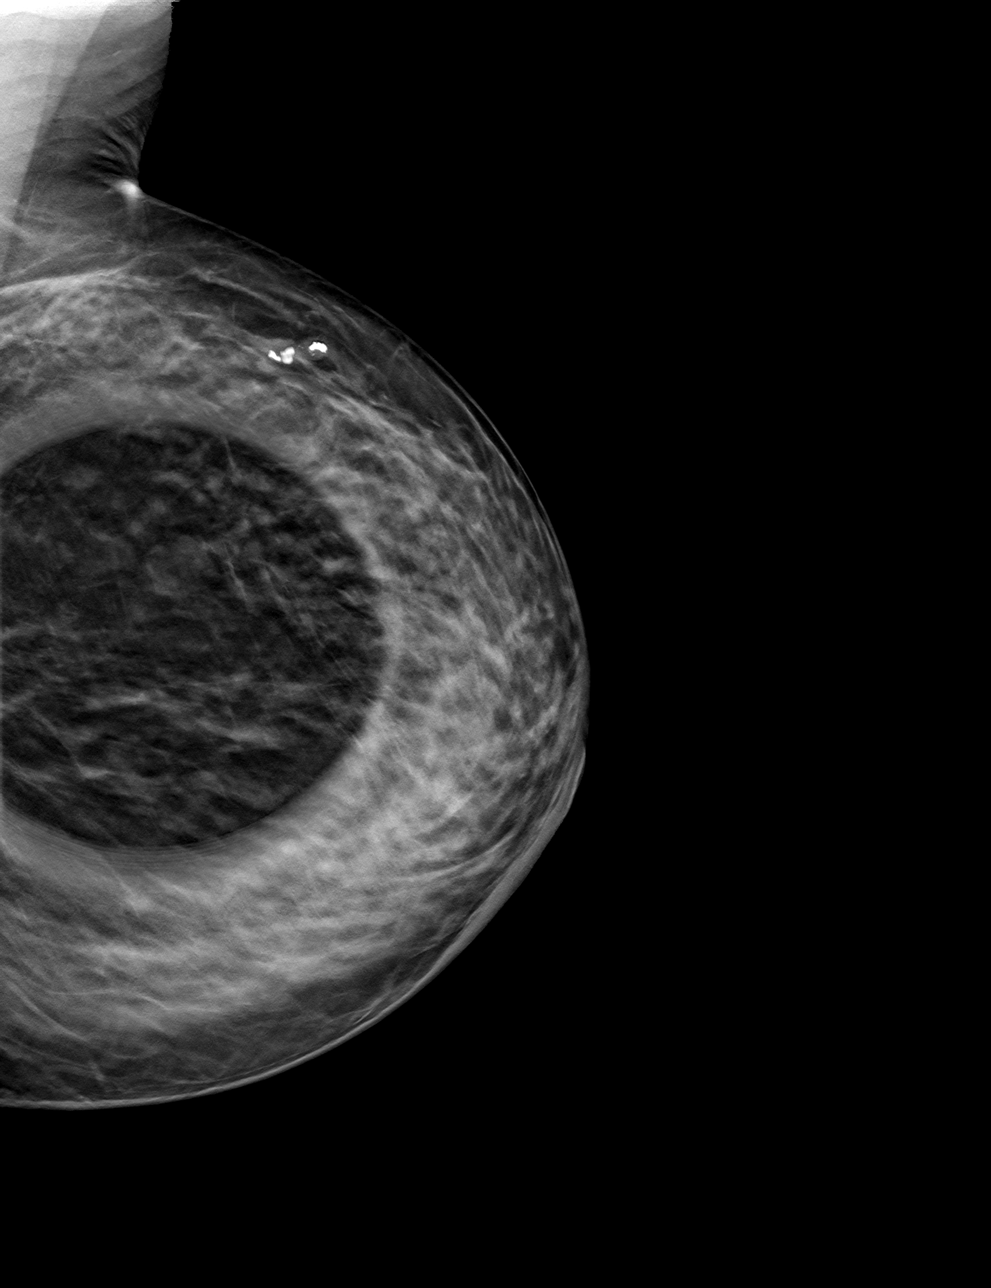

[L LM tomo · tomo slice 25/50.0]
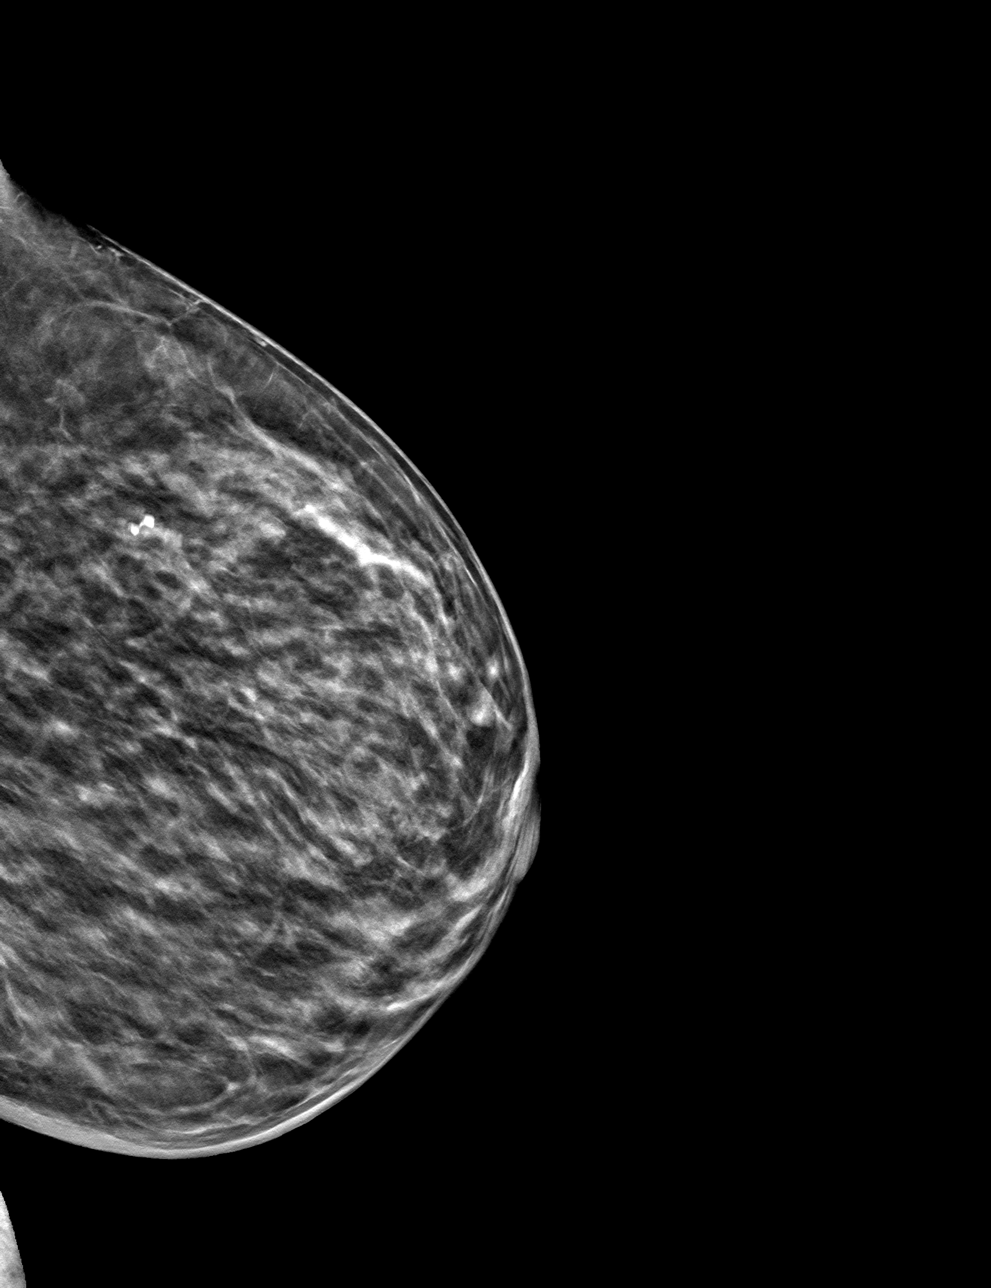

[4 of 12 positions shown; findings below may reference images not displayed]

ACR Breast Density Category c: The breast tissue is heterogeneously
dense, which may obscure small masses.
FINDINGS: Tomosynthesis and synthesized spot-compression MLO view of the area
of concern in the LEFT breast and a tomosynthesis and synthesized
full field mediolateral view of the LEFT breast were obtained.

The asymmetry in the UPPER breast at MIDDLE to POSTERIOR depth
questioned on screening mammography disperses with compression
indicating overlapping fibroglandular tissue. There is no underlying
mass or architectural distortion.

No findings suspicious for malignancy in the LEFT breast.

The full field mediolateral image was processed with CAD.
IMPRESSION: No mammographic evidence of malignancy involving the LEFT breast.

RECOMMENDATION:
Screening mammogram in one year.(Code:LK-[id number])

I have discussed the findings and recommendations with the patient.
If applicable, a reminder letter will be sent to the patient
regarding the next appointment.

BI-RADS CATEGORY  1: Negative.

## 2021-05-01 ENCOUNTER — Ambulatory Visit (INDEPENDENT_AMBULATORY_CARE_PROVIDER_SITE_OTHER): Payer: Medicare HMO

## 2021-05-01 DIAGNOSIS — I442 Atrioventricular block, complete: Secondary | ICD-10-CM

## 2021-05-03 LAB — CUP PACEART REMOTE DEVICE CHECK
Battery Remaining Longevity: 78 mo
Battery Remaining Percentage: 82 %
Battery Voltage: 2.99 V
Brady Statistic AP VP Percent: 38 %
Brady Statistic AP VS Percent: 1 %
Brady Statistic AS VP Percent: 58 %
Brady Statistic AS VS Percent: 1 %
Brady Statistic RA Percent Paced: 32 %
Date Time Interrogation Session: 20230410020010
Implantable Lead Implant Date: 20220411
Implantable Lead Implant Date: 20220411
Implantable Lead Implant Date: 20220411
Implantable Lead Location: 753858
Implantable Lead Location: 753859
Implantable Lead Location: 753860
Implantable Pulse Generator Implant Date: 20220411
Lead Channel Impedance Value: 390 Ohm
Lead Channel Impedance Value: 410 Ohm
Lead Channel Impedance Value: 600 Ohm
Lead Channel Pacing Threshold Amplitude: 0.625 V
Lead Channel Pacing Threshold Amplitude: 0.75 V
Lead Channel Pacing Threshold Amplitude: 0.75 V
Lead Channel Pacing Threshold Pulse Width: 0.5 ms
Lead Channel Pacing Threshold Pulse Width: 0.5 ms
Lead Channel Pacing Threshold Pulse Width: 0.5 ms
Lead Channel Sensing Intrinsic Amplitude: 3.4 mV
Lead Channel Sensing Intrinsic Amplitude: 8.5 mV
Lead Channel Setting Pacing Amplitude: 2 V
Lead Channel Setting Pacing Amplitude: 2 V
Lead Channel Setting Pacing Amplitude: 2 V
Lead Channel Setting Pacing Pulse Width: 0.5 ms
Lead Channel Setting Pacing Pulse Width: 0.5 ms
Lead Channel Setting Sensing Sensitivity: 4 mV
Pulse Gen Model: 3562
Pulse Gen Serial Number: 6262141

## 2021-05-05 ENCOUNTER — Other Ambulatory Visit: Payer: Self-pay

## 2021-05-05 MED ORDER — ATORVASTATIN CALCIUM 80 MG PO TABS: 80.0000 mg | ORAL_TABLET | Freq: Every day | ORAL | 1 refills | Status: DC

## 2021-05-15 ENCOUNTER — Encounter: Payer: Self-pay | Admitting: Family Medicine

## 2021-05-15 ENCOUNTER — Ambulatory Visit: Payer: Medicare HMO | Admitting: Family Medicine

## 2021-05-15 DIAGNOSIS — G40909 Epilepsy, unspecified, not intractable, without status epilepticus: Secondary | ICD-10-CM | POA: Diagnosis not present

## 2021-05-15 MED ORDER — LACOSAMIDE 100 MG PO TABS: 100.0000 mg | ORAL_TABLET | Freq: Two times a day (BID) | ORAL | 5 refills | Status: DC

## 2021-05-15 MED ORDER — LEVETIRACETAM 750 MG PO TABS
1500.0000 mg | ORAL_TABLET | Freq: Two times a day (BID) | ORAL | 3 refills | Status: DC
Start: 2021-05-15 — End: 2022-05-16

## 2021-05-15 NOTE — Patient Instructions (Signed)
Below is our plan: ? ?We will continue levetiracetam '1500mg'$  and lacosamide '100mg'$  twice daily.  ? ?Please make sure you are consistent with timing of seizure medication. I recommend annual visit with primary care provider (PCP) for complete physical and routine blood work. We will monitor vitamin D level. I recommend daily intake of vitamin D (400-800iu) and calcium (800-'1000mg'$ ) for bone health. Discuss Dexa screening with PCP.  ? ?According to [place] law, you can not drive unless you are seizure / syncope free for at least 6 months and under physician's care. ? ?Please maintain precautions. Do not participate in activities where a loss of awareness could harm you or someone else. No swimming alone, no tub bathing, no hot tubs, no driving, no operating motorized vehicles (cars, ATVs, motocycles, etc), lawnmowers, power tools or firearms. No standing at heights, such as rooftops, ladders or stairs. Avoid hot objects such as stoves, heaters, open fires. Wear a helmet when riding a bicycle, scooter, skateboard, etc. and avoid areas of traffic. Set your water heater to 120 degrees or less.  ? ?Please make sure you are staying well hydrated. I recommend 50-60 ounces daily. Well balanced diet and regular exercise encouraged. Consistent sleep schedule with 6-8 hours recommended.  ? ?Please continue follow up with care team as directed.  ? ?Follow up with me in 1 year  ? ?You may receive a survey regarding today's visit. I encourage you to leave honest feed back as I do use this information to improve patient care. Thank you for seeing me today!  ? ? ?

## 2021-05-15 NOTE — Progress Notes (Signed)
? ? ?Chief Complaint  ?Patient presents with  ? Follow-up  ?  Pt with husband, rm 35. Follow up seizure management. Overall things are well, no concerns. Denies seizures   ? ? ?HISTORY OF PRESENT ILLNESS: ? ?05/15/21 ALL:  ?[name] is a 71 y.o. female here today for follow up for seizures. She presetns with her husband who provides history. She continues levetiracetam '1500mg'$  and lacosamide '100mg'$  twice daily. No seizure activity. She is tolerating meds without obvious adverse effects. She is able to perform ADLs with minimal assistance. She walks around home without assistance but uses wheelchair for any long distance walking. She has significant aphasia, receptive and expressive.  ? ?She continues close follow up with PCP and cardiology. She continues atorvastatin '80mg'$  daily. On warfarin as well.  ? ?HISTORY (copied from Dr Gladstone Lighter previous note) ? ?UPDATE (12/30/19, [facility]): Since last visit, doing well. No seizures. Tolerating meds.   ?  ?PRIOR HPI: 71 year old female here for evaluation of seizure.  History of left hemisphere stroke in 2009 with resultant right-sided hemiparesis.  Patient also had seizure disorder started around 2009.  Patient was doing well until August 2020 when patient had run out of antiseizure medications and had breakthrough seizures, brought to the hospital and treated.  Patient had multiple clinical and electrographic seizures in the hospital.  Patient was maintained on levetiracetam and Vimpat and dilantin; then Dilantin was tapered off at discharge.  ?  ?Since then patient is stable.  Tolerating medications. ?  ? ?REVIEW OF SYSTEMS: Out of a complete 14 system review of symptoms, the patient complains only of the following symptoms, right sided weakness, difficulty with speech, and all other reviewed systems are negative. ? ? ?ALLERGIES: ?Allergies  ?Allergen Reactions  ? Lexiscan [Regadenoson] Other (See Comments)  ?  Perioral tingling and throat tightness  ? ? ? ?HOME  MEDICATIONS: ?Outpatient Medications Prior to Visit  ?Medication Sig Dispense Refill  ? acetaminophen (TYLENOL) 325 MG tablet Take 650 mg by mouth 2 (two) times a week.    ? aspirin 81 MG chewable tablet Chew 1 tablet (81 mg total) by mouth daily. 30 tablet 6  ? atorvastatin (LIPITOR) 80 MG tablet Take 1 tablet (80 mg total) by mouth at bedtime. Needs appointment before anymore future refills. 90 tablet 1  ? Cholecalciferol 50 MCG (2000 UT) TABS Take 1 tablet by mouth daily in the afternoon.    ? furosemide (LASIX) 20 MG tablet TAKE 1 TABLET(20 MG) BY MOUTH EVERY OTHER DAY 30 tablet 1  ? JARDIANCE 10 MG TABS tablet TAKE 1 TABLET(10 MG) BY MOUTH DAILY BEFORE AND BREAKFAST 30 tablet 8  ? metoprolol succinate (TOPROL-XL) 50 MG 24 hr tablet Take 1 tablet (50 mg total) by mouth daily. Take with or immediately following a meal. 90 tablet 3  ? Nutritional Supplements (NUTRITIONAL SUPPLEMENT PO) Take 120 mLs by mouth once. Medpass for weight gain.    ? sacubitril-valsartan (ENTRESTO) 97-103 MG Take 1 tablet by mouth 2 (two) times daily. 60 tablet 11  ? spironolactone (ALDACTONE) 25 MG tablet TAKE 1 TABLET(25 MG) BY MOUTH DAILY 45 tablet 3  ? warfarin (COUMADIN) 5 MG tablet Take 5 mg by mouth daily.    ? Lacosamide (VIMPAT) 100 MG TABS Take 1 tablet (100 mg total) by mouth in the morning and at bedtime. 60 tablet 5  ? levETIRAcetam (KEPPRA) 750 MG tablet TAKE 2 TABLETS TWICE DAILY 360 tablet 4  ? ?No facility-administered medications prior to visit.  ? ? ? ?  PAST MEDICAL HISTORY: ?Past Medical History:  ?Diagnosis Date  ? Adenomatous polyp of colon 09/2012  ? repeat colonoscopy in 5 years by Dr. Sharlett Iles  ? CHF (congestive heart failure) ([place])   ? EF 15-20% as of 05/28/11 (Dr Legrand [facility] summary)  ? CVA (cerebral infarction) 12/2007  ? H/O heart bypass surgery 2022  ? Hemiparesis ([place])   ? Hyperlipidemia   ? Hypertension   ? Seizures ([place])   ? Sickle cell anemia ([facility])   ? Stroke [facility])   ? Vitamin D deficiency  2010  ? ? ? ?PAST SURGICAL HISTORY: ?Past Surgical History:  ?Procedure Laterality Date  ? ABDOMINAL HYSTERECTOMY    ? BIV PACEMAKER INSERTION CRT-P N/A 04/29/2020  ? Procedure: BIV PACEMAKER INSERTION CRT-P;  Surgeon: Constance Haw, MD;  Location: [facility];  Service: Cardiovascular;  Laterality: N/A;  ? CORONARY ARTERY BYPASS GRAFT N/A 04/25/2020  ? Procedure: CORONARY ARTERY BYPASS GRAFTING (CABG) TIMES TWO USING LEFT INTERNAL MAMMARY ARTERY AND RIGHT GREATER SAPHENOUS VEIN HARVESTED ENDOSCOPICALLY;  Surgeon: Melrose Nakayama, MD;  Location: [facility];  Service: Open Heart Surgery;  Laterality: N/A;  ? FRACTURE SURGERY    ? HIP SURGERY    ? LEFT HEART CATH AND CORONARY ANGIOGRAPHY N/A 04/22/2020  ? Procedure: LEFT HEART CATH AND CORONARY ANGIOGRAPHY;  Surgeon: Larey Dresser, MD;  Location: [facility];  Service: Cardiovascular;  Laterality: N/A;  ? TEE WITHOUT CARDIOVERSION  04/25/2020  ? Procedure: TRANSESOPHAGEAL ECHOCARDIOGRAM (TEE);  Surgeon: Melrose Nakayama, MD;  Location: [facility];  Service: Open Heart Surgery;;  ? TEMPORARY PACEMAKER N/A 04/21/2020  ? Procedure: TEMPORARY PACEMAKER;  Surgeon: Larey Dresser, MD;  Location: [facility];  Service: Cardiovascular;  Laterality: N/A;  ? TUBAL LIGATION    ? ? ? ?FAMILY HISTORY: ?Family History  ?Problem Relation Age of Onset  ? Diabetes Daughter   ? High Cholesterol Daughter   ? Hypertension Daughter   ? Heart disease Daughter   ? Hypertension Sister   ? High Cholesterol Sister   ? Asthma Brother   ? Bronchitis Brother   ? Colon cancer Maternal Grandfather   ? ? ? ?SOCIAL HISTORY: ?Social History  ? ?Socioeconomic History  ? Marital status: Married  ?  Spouse name: [name]  ? Number of children: 2  ? Years of education: 92  ? Highest education level: Not on file  ?Occupational History  ?  Employer: RETIRED  ?Tobacco Use  ? Smoking status: Former  ?  Types: Cigarettes  ?  Quit date: 09/26/2007  ?  Years since quitting: 13.6  ? Smokeless  tobacco: Never  ?Vaping Use  ? Vaping Use: Never used  ?Substance and Sexual Activity  ? Alcohol use: Not Currently  ?  Comment: Former drinker, 15 years ago  ? Drug use: Never  ? Sexual activity: Not Currently  ?Other Topics Concern  ? Not on file  ?Social History Narrative  ? Tobacco use, amount per day now: None.  ? Past tobacco use, amount per day: Everyday.  ? How many years did you use tobacco: 30 years.  ? Alcohol use (drinks per week): Wine Daily  ? Diet:  ? Do you drink/eat things with caffeine: Yes  ? Marital status: Married                                  What year were  you married?   ? Do you live in a house, apartment, assisted living, condo, trailer, etc.? House  ? Is it one or more stories? 1 story.  ? How many persons live in your home? 2  ? Do you have pets in your home?( please list) No  ? Highest Level of education completed? Associate  ? Current or past profession: Mudlogger   ? Do you exercise?    No                             Type and how often?  ? Do you have a living will?   ? Do you have a DNR form?                                   If not, do you want to discuss one?  ? Do you have signed POA/HPOA forms?                        If so, please bring to you appointment  ?   ? Do you have any difficulty bathing or dressing yourself? Yes  ? Do you have any difficulty preparing food or eating? Yes  ? Do you have any difficulty managing your medications? Yes  ? Do you have any difficulty managing your finances? Yes  ? Do you have any difficulty affording your medications? Yes  ? ?Social Determinants of Health  ? ?Financial Resource Strain: Not on file  ?Food Insecurity: Not on file  ?Transportation Needs: Not on file  ?Physical Activity: Not on file  ?Stress: Not on file  ?Social Connections: Not on file  ?Intimate Partner Violence: Not on file  ? ? ? ?PHYSICAL EXAM ? ?Vitals:  ? 05/15/21 1312  ?BP: 110/65  ?Pulse: 65  ?Weight: 121 lb (54.9 kg)  ?Height: '5\' 7"'$  (1.702 m)  ? ?Body mass index is  18.95 kg/m?. ? ?Generalized: Well developed, in no acute distress ? ?Cardiology: normal rate and rhythm, no murmur auscultated  ?Respiratory: clear to auscultation bilaterally   ? ?Neurological examination

## 2021-05-18 NOTE — Progress Notes (Signed)
Remote pacemaker transmission.   

## 2021-06-12 ENCOUNTER — Encounter (HOSPITAL_COMMUNITY): Payer: Self-pay | Admitting: Cardiology

## 2021-06-12 ENCOUNTER — Ambulatory Visit (HOSPITAL_COMMUNITY)
Admission: RE | Admit: 2021-06-12 | Discharge: 2021-06-12 | Disposition: A | Discharge status: Home / Self Care | Payer: Medicare HMO | Source: Ambulatory Visit | Attending: Cardiology | Admitting: Cardiology

## 2021-06-12 VITALS — BP 112/68 | HR 61 | Wt 122.8 lb

## 2021-06-12 DIAGNOSIS — I5022 Chronic systolic (congestive) heart failure: Secondary | ICD-10-CM | POA: Diagnosis not present

## 2021-06-12 DIAGNOSIS — Z7901 Long term (current) use of anticoagulants: Secondary | ICD-10-CM | POA: Diagnosis not present

## 2021-06-12 DIAGNOSIS — Z7982 Long term (current) use of aspirin: Secondary | ICD-10-CM | POA: Diagnosis not present

## 2021-06-12 DIAGNOSIS — I442 Atrioventricular block, complete: Secondary | ICD-10-CM | POA: Diagnosis not present

## 2021-06-12 DIAGNOSIS — G40909 Epilepsy, unspecified, not intractable, without status epilepticus: Secondary | ICD-10-CM | POA: Insufficient documentation

## 2021-06-12 DIAGNOSIS — I2510 Atherosclerotic heart disease of native coronary artery without angina pectoris: Secondary | ICD-10-CM | POA: Diagnosis not present

## 2021-06-12 DIAGNOSIS — I255 Ischemic cardiomyopathy: Secondary | ICD-10-CM | POA: Insufficient documentation

## 2021-06-12 DIAGNOSIS — I5042 Chronic combined systolic (congestive) and diastolic (congestive) heart failure: Secondary | ICD-10-CM

## 2021-06-12 DIAGNOSIS — Z8673 Personal history of transient ischemic attack (TIA), and cerebral infarction without residual deficits: Secondary | ICD-10-CM | POA: Diagnosis not present

## 2021-06-12 DIAGNOSIS — Z79899 Other long term (current) drug therapy: Secondary | ICD-10-CM | POA: Insufficient documentation

## 2021-06-12 DIAGNOSIS — Z951 Presence of aortocoronary bypass graft: Secondary | ICD-10-CM | POA: Diagnosis not present

## 2021-06-12 DIAGNOSIS — E782 Mixed hyperlipidemia: Secondary | ICD-10-CM

## 2021-06-12 LAB — CBC
HCT: 38.1 % (ref 36.0–46.0)
Hemoglobin: 11.9 g/dL — ABNORMAL LOW (ref 12.0–15.0)
MCH: 28.3 pg (ref 26.0–34.0)
MCHC: 31.2 g/dL (ref 30.0–36.0)
MCV: 90.7 fL (ref 80.0–100.0)
Platelets: 224 10*3/uL (ref 150–400)
RBC: 4.2 MIL/uL (ref 3.87–5.11)
RDW: 13.4 % (ref 11.5–15.5)
WBC: 5.1 10*3/uL (ref 4.0–10.5)
nRBC: 0 % (ref 0.0–0.2)

## 2021-06-12 LAB — BASIC METABOLIC PANEL
Anion gap: 7 (ref 5–15)
BUN: 23 mg/dL (ref 8–23)
CO2: 23 mmol/L (ref 22–32)
Calcium: 9.1 mg/dL (ref 8.9–10.3)
Chloride: 110 mmol/L (ref 98–111)
Creatinine, Ser: 1.19 mg/dL — ABNORMAL HIGH (ref 0.44–1.00)
GFR, Estimated: 49 mL/min — ABNORMAL LOW (ref >60)
Glucose, Bld: 91 mg/dL (ref 70–99)
Potassium: 4.4 mmol/L (ref 3.5–5.1)
Sodium: 140 mmol/L (ref 135–145)

## 2021-06-12 LAB — LIPID PANEL
Cholesterol: 126 mg/dL (ref 0–200)
HDL: 46 mg/dL (ref >40)
LDL Cholesterol: 67 mg/dL (ref 0–99)
Total CHOL/HDL Ratio: 2.7 RATIO
Triglycerides: 63 mg/dL (ref <150)
VLDL: 13 mg/dL (ref 0–40)

## 2021-06-12 MED ORDER — METOPROLOL SUCCINATE ER 50 MG PO TB24: 50.0000 mg | ORAL_TABLET | Freq: Two times a day (BID) | ORAL | 3 refills | Status: DC

## 2021-06-12 NOTE — Progress Notes (Signed)
PCP: [name], MD HF Cardiology: Dr. Aundra Dubin  71 y.o. with history of CVA, chronic systolic CHF, CHB, and CAD returns for followup of CHF.   She had a stroke in 2009, thought to be cardioembolic.  She has had right hemiparesis and aphasia since that time.   [name] was hospitalized in August 2020 with a seizure after being out of her medications for 4 days.  She required intubation.  High-sensitivity troponin trended to 1905. An echocardiogram 08/2018 revealed EF of 35 to 40% with global hypokinesis and moderate LVH, elevated LVEDP.  She also had moderate AR. Nuclear stress test in August 2020 showed a large severe fixed inferior, anterior apical, and inferolateral perfusion defect suggestive of scar without ischemia.  Study was high risk due to EF estimated at 34% but mentions no significant reversible ischemia. She was deemed not a good candidate for cardiac catheterization during that hospitalization.    Patient was admitted in 3/22 following a syncopal episode and was found to be in complete heart block.  This improved initially after stopping nodal blockers.  Echo in 3/22 showed EF 35-40%.  LHC showed significant left main and LCx disease.  She was deemed to be a poor candidate for PCI.  In 4/22, she had CABG with LIMA-LAD and SVG-OM2.  Post-op, she developed CHB again.  St Jude CRT-P device was placed. She was discharged to a rehab facility.   Echo in 9/22 showed EF 35% with basal to mid inferior and inferolateral AK, mildly decreased RV function, mild-moderate MR.     She is here with her husband who helps with history due to aphasia. Not very active due to right-sided weakness.  No significant exertional dyspnea, walks through house with cane.  No orthopnea/PND.  Occasional atypical chest pain.  Weight down 3 lbs.   Labs (4/22): K 3.9, creatinine 0.83, hgb 9.4 Labs (7/22): K 4.2, creatinine 0.93 Labs (9/22): LDL 67 Labs (2/23): K 4.5, creatinine 1.12 Labs (3/23): K 4.5, creatinine  0.96  ECG (personally reviewed): NSR, BiV paced  PMH: 1. H/o seizure disorder.  2. CVA: 2009 with aphasia and right hemiparesis.  3. Complete heart block: St Jude CRT-P. 4. Chronic systolic CHF: Ischemic cardiomyopathy. St Jude CRT-P device.  - Echo (3/22): EF 35-40%, inferior and lateral HK, normal RV.  - Coronary angiography (4/22): 60-70% dLM, 60-70% ostial LCx, 99% proximal calcified LCx (LCx was large, dominant vessel).  - Echo (9/22): EF 35% with basal to mid inferior and inferolateral AK, mildly decreased RV function, mild-moderate MR. 5. CAD:  Coronary angiography (4/22) with 60-70% dLM, 60-70% ostial LCx, 99% proximal calcified LCx (LCx was large, dominant vessel). - CABG x 4 in 4/22: LIMA-LAD, SVG-OM2.  6. CHB: St Jude CRT-P placed in 4/22.   Social History   Socioeconomic History   Marital status: Married    Spouse name: [name]   Number of children: 2   Years of education: 12   Highest education level: Not on file  Occupational History    Employer: RETIRED  Tobacco Use   Smoking status: Former    Types: Cigarettes    Quit date: 09/26/2007    Years since quitting: 13.7   Smokeless tobacco: Never  Vaping Use   Vaping Use: Never used  Substance and Sexual Activity   Alcohol use: Not Currently    Comment: Former drinker, 15 years ago   Drug use: Never   Sexual activity: Not Currently  Other Topics Concern   Not on  file  Social History Narrative   Tobacco use, amount per day now: None.   Past tobacco use, amount per day: Everyday.   How many years did you use tobacco: 30 years.   Alcohol use (drinks per week): Wine Daily   Diet:   Do you drink/eat things with caffeine: Yes   Marital status: Married                                  What year were you married?    Do you live in a house, apartment, assisted living, condo, trailer, etc.? House   Is it one or more stories? 1 story.   How many persons live in your home? 2   Do you have pets in your home?( please  list) No   Highest Level of education completed? Associate   Current or past profession: Director    Do you exercise?    No                             Type and how often?   Do you have a living will?    Do you have a DNR form?                                   If not, do you want to discuss one?   Do you have signed POA/HPOA forms?                        If so, please bring to you appointment      Do you have any difficulty bathing or dressing yourself? Yes   Do you have any difficulty preparing food or eating? Yes   Do you have any difficulty managing your medications? Yes   Do you have any difficulty managing your finances? Yes   Do you have any difficulty affording your medications? Yes   Social Determinants of Health   Financial Resource Strain: Not on file  Food Insecurity: Not on file  Transportation Needs: Not on file  Physical Activity: Not on file  Stress: Not on file  Social Connections: Not on file  Intimate Partner Violence: Not on file   Family History  Problem Relation Age of Onset   Diabetes Daughter    High Cholesterol Daughter    Hypertension Daughter    Heart disease Daughter    Hypertension Sister    High Cholesterol Sister    Asthma Brother    Bronchitis Brother    Colon cancer Maternal Grandfather    ROS: All systems reviewed and negative except as per HPI.   Current Outpatient Medications  Medication Sig Dispense Refill   acetaminophen (TYLENOL) 325 MG tablet Take 650 mg by mouth 2 (two) times a week.     aspirin 81 MG chewable tablet Chew 1 tablet (81 mg total) by mouth daily. 30 tablet 6   atorvastatin (LIPITOR) 80 MG tablet Take 1 tablet (80 mg total) by mouth at bedtime. Needs appointment before anymore future refills. 90 tablet 1   Cholecalciferol 50 MCG (2000 UT) TABS Take 1 tablet by mouth daily in the afternoon.     furosemide (LASIX) 20 MG tablet TAKE 1 TABLET(20 MG) BY MOUTH EVERY OTHER DAY 30 tablet 1   JARDIANCE 10 MG  TABS tablet TAKE 1  TABLET(10 MG) BY MOUTH DAILY BEFORE AND BREAKFAST 30 tablet 8   Lacosamide (VIMPAT) 100 MG TABS Take 1 tablet (100 mg total) by mouth in the morning and at bedtime. 60 tablet 5   levETIRAcetam (KEPPRA) 750 MG tablet Take 2 tablets (1,500 mg total) by mouth 2 (two) times daily. 360 tablet 3   Nutritional Supplements (NUTRITIONAL SUPPLEMENT PO) Take 120 mLs by mouth once. Medpass for weight gain.     sacubitril-valsartan (ENTRESTO) 97-103 MG Take 1 tablet by mouth 2 (two) times daily. 60 tablet 11   spironolactone (ALDACTONE) 25 MG tablet TAKE 1 TABLET(25 MG) BY MOUTH DAILY 45 tablet 3   warfarin (COUMADIN) 5 MG tablet Take 5 mg by mouth daily.     metoprolol succinate (TOPROL-XL) 50 MG 24 hr tablet Take 1 tablet (50 mg total) by mouth 2 (two) times daily. Take with or immediately following a meal. 90 tablet 3   No current facility-administered medications for this encounter.   BP 112/68   Pulse 61   Wt 55.7 kg (122 lb 12.8 oz)   SpO2 97%   BMI 19.23 kg/m  General: NAD Neck: No JVD, no thyromegaly or thyroid nodule.  Lungs: Clear to auscultation bilaterally with normal respiratory effort. CV: Nondisplaced PMI.  Heart regular S1/S2, no S3/S4, no murmur.  No peripheral edema.  No carotid bruit.  Normal pedal pulses.  Abdomen: Soft, nontender, no hepatosplenomegaly, no distention.  Skin: Intact without lesions or rashes.  Neurologic: Alert and oriented x 3. Aphasia with right hemiparesis.  Psych: Normal affect. Extremities: No clubbing or cyanosis.  HEENT: Normal.   Asssessment/Plan: 1. Complete heart block: Patient had baseline NSR with LAFB/RBBB, so there was underlying conduction system disease.  She was transiently in CHB pre-CABG and developed persistent CHB post-CABG.  She now has [employer] CRT-P device.   2. Chronic systolic CHF: Ischemic cardiomyopathy.  Echo in 8/20 with EF 35-40% and wall motion abnormalities. LHC in 4/22 showed left main/severe codominant LCx disease.  Echo in 3/22  with EF 35-40%, inferior and lateral hypokinesis.  Echo in 9/22 with EF 35%, mild RV dysfunction.  Now s/p CABG.  Probably NYHA class II.  She is not volume overloaded on exam.  - Continue Entresto 97/103 bid.  BMET today.   - Continue spironolactone 25 mg daily.    - Increase Toprol XL to 50 mg bid.  - Continue Jardiance 10 mg daily.   - Continue Lasix 20 mg every other day.  3. 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  Has been on warfarin.  Has had long-standing aphasia and right hemiparesis.   - Continue warfarin.  CBC today.  4. H/o seizure disorder: Continue Keppra.  5. CAD:  LHC showed 60-70% distal left main stenosis, heavily calcified proximal LCx with 60-70% stenosis at the ostium and discrete 99% stenosis in the proximal LCx.  I suspect that her CHB is related to chronic ischemia from LM-LCx disease worsened by nodal blockade. Had CABG x 2 4/22 w/ LIMA-LAD, SVG-OM2.  No chest pain.  - Continue ASA 81 daily.  - Continue statin, check lipids today.   Followup in 3 months with APP.   Loralie Champagne 06/12/2021

## 2021-06-12 NOTE — Patient Instructions (Signed)
Medication Changes:  Increase Toprol XL to '50mg'$  Twice daily   Lab Work:  Labs done today, your results will be available in MyChart, we will contact you for abnormal readings.   Testing/Procedures:  none  Referrals:  none  Special Instructions // Education:  none  Follow-Up in: 3 months   At the [facility], you and your health needs are our priority. We have a designated team specialized in the treatment of Heart Failure. This Care Team includes your primary Heart Failure Specialized Cardiologist (physician), Advanced Practice Providers (APPs- Physician Assistants and Nurse Practitioners), and Pharmacist who all work together to provide you with the care you need, when you need it.   You may see any of the following providers on your designated Care Team at your next follow up:  Dr Glori Bickers Dr Haynes Kerns, NP Lyda Jester, [place] [facility], [place] Audry Riles, PharmD   Please be sure to bring in all your medications bottles to every appointment.   Need to Contact us:  If you have any questions or concerns before your next appointment please send [place] a message through [place] or call our office at [phone].    TO LEAVE A MESSAGE FOR THE NURSE SELECT OPTION 2, PLEASE LEAVE A MESSAGE INCLUDING: YOUR NAME DATE OF BIRTH CALL BACK NUMBER REASON FOR CALL**this is important as we prioritize the call backs  YOU WILL RECEIVE A CALL BACK THE SAME DAY AS LONG AS YOU CALL BEFORE 4:00 PM

## 2021-07-06 ENCOUNTER — Other Ambulatory Visit: Payer: Self-pay | Admitting: Family Medicine

## 2021-07-06 ENCOUNTER — Other Ambulatory Visit: Payer: Self-pay | Admitting: Diagnostic Neuroimaging

## 2021-07-06 DIAGNOSIS — G40909 Epilepsy, unspecified, not intractable, without status epilepticus: Secondary | ICD-10-CM

## 2021-07-06 MED ORDER — LACOSAMIDE 100 MG PO TABS: 100.0000 mg | ORAL_TABLET | Freq: Two times a day (BID) | ORAL | 5 refills | Status: DC

## 2021-07-06 NOTE — Telephone Encounter (Signed)
Called husband back. States [facility] does not have Vimpat rx called in on 04/2421 #60, 5 refills by Amy Lomax,NP. Aware I will call to see if they can located rx sent. If not, I will have provider resend. Pt last seen 05/15/21 and next f/u 05/16/22.   Called [facility] at [phone]. Spoke w/ Lavella Lemons. They cannot use rx from NP since they dispense from [facility]. Need MD to e-scribe rx.

## 2021-07-06 NOTE — Telephone Encounter (Signed)
Pt's husband, Parks Neptune [place] said pt has enough Vimpat for 4 days. Would like a call from the nurse.

## 2021-07-06 NOTE — Telephone Encounter (Signed)
Meds ordered this encounter  Medications   Lacosamide (VIMPAT) 100 MG TABS    Sig: Take 1 tablet (100 mg total) by mouth in the morning and at bedtime.    Dispense:  60 tablet    Refill:  5    PLEASE EXPEDITE, down to 4 tablets left    Penni Bombard, MD 8/36/6294, 7:65 PM Certified in Neurology, Neurophysiology and Neuroimaging  [facility] [address], [place], [place] [address] [phone]

## 2021-07-19 ENCOUNTER — Other Ambulatory Visit (HOSPITAL_COMMUNITY): Payer: Self-pay | Admitting: Cardiology

## 2021-07-27 ENCOUNTER — Other Ambulatory Visit (HOSPITAL_COMMUNITY): Payer: Self-pay | Admitting: *Deleted

## 2021-07-27 MED ORDER — FUROSEMIDE 20 MG PO TABS: ORAL_TABLET | ORAL | 3 refills | Status: DC

## 2021-07-31 ENCOUNTER — Ambulatory Visit (INDEPENDENT_AMBULATORY_CARE_PROVIDER_SITE_OTHER): Payer: Medicare HMO

## 2021-07-31 DIAGNOSIS — I442 Atrioventricular block, complete: Secondary | ICD-10-CM

## 2021-08-01 LAB — CUP PACEART REMOTE DEVICE CHECK
Battery Remaining Longevity: 74 mo
Battery Remaining Percentage: 79 %
Battery Voltage: 2.99 V
Brady Statistic AP VP Percent: 37 %
Brady Statistic AP VS Percent: 1 %
Brady Statistic AS VP Percent: 59 %
Brady Statistic AS VS Percent: 1 %
Brady Statistic RA Percent Paced: 32 %
Date Time Interrogation Session: 20230710020008
Implantable Lead Implant Date: 20220411
Implantable Lead Implant Date: 20220411
Implantable Lead Implant Date: 20220411
Implantable Lead Location: 753858
Implantable Lead Location: 753859
Implantable Lead Location: 753860
Implantable Pulse Generator Implant Date: 20220411
Lead Channel Impedance Value: 400 Ohm
Lead Channel Impedance Value: 410 Ohm
Lead Channel Impedance Value: 580 Ohm
Lead Channel Pacing Threshold Amplitude: 0.625 V
Lead Channel Pacing Threshold Amplitude: 0.75 V
Lead Channel Pacing Threshold Amplitude: 1.125 V
Lead Channel Pacing Threshold Pulse Width: 0.5 ms
Lead Channel Pacing Threshold Pulse Width: 0.5 ms
Lead Channel Pacing Threshold Pulse Width: 0.5 ms
Lead Channel Sensing Intrinsic Amplitude: 3.1 mV
Lead Channel Sensing Intrinsic Amplitude: 7.7 mV
Lead Channel Setting Pacing Amplitude: 2 V
Lead Channel Setting Pacing Amplitude: 2 V
Lead Channel Setting Pacing Amplitude: 2.125
Lead Channel Setting Pacing Pulse Width: 0.5 ms
Lead Channel Setting Pacing Pulse Width: 0.5 ms
Lead Channel Setting Sensing Sensitivity: 4 mV
Pulse Gen Model: 3562
Pulse Gen Serial Number: 6262141

## 2021-08-22 NOTE — Progress Notes (Signed)
Remote pacemaker transmission.   

## 2021-09-01 ENCOUNTER — Other Ambulatory Visit (HOSPITAL_BASED_OUTPATIENT_CLINIC_OR_DEPARTMENT_OTHER): Payer: Self-pay | Admitting: Cardiology

## 2021-09-05 NOTE — Progress Notes (Unsigned)
Cardiology Office Note:    Date:  09/07/2021   ID:  [name], [name] [birth year], MRN [id number]  PCP:  [name], MD   [facility] Providers Cardiologist:  Candee Furbish, MD Electrophysiologist:  Will Meredith Leeds, MD     Referring MD: [name], MD   Chief Complaint: follow-up CAD  History of Present Illness:    [name] is a pleasant 71 y.o. female with a hx of CVA with aphasia, CAD s/p CABG x 2, CHB s/p pacemaker implant with baseline LAFB/RBBB, chronic combined CHF, and seizure disorder.   Stroke 2353 felt to be embolic, on warfarin.  Longstanding aphasia with right hemiparesis.  Able to use walker inside the house. Uses wheelchair when outside home.   In August 2020 had a seizure after being out of medication for 4 days that required intubation. Troponin was 1900.  Echo showed EF 35 to 40%, elevated left atrial pressures, moderate aortic regurgitation.  Stress test performed in 2020 showed large severe fixed inferior and inferior lateral perfusion defect suggestive of scar.  High risk.  No reversible ischemia.  Felt that she was not a good cardiac catheterization candidate during that hospitalization.  2022 had syncopal episode found to be in complete heart block.  This improved after stopping nodal blockers initially.  Left heart catheterization at that time showed significant left main and left circumflex disease.  Poor candidate for PCI.  She underwent CABG in April 2022 with LIMA-LAD and SVG to OM2.  She again developed complete heart block postoperatively and Saint Jude CRT pacemaker was placed.  Seen by Dr. Marlou Porch 02/28/21, no changes to treatment and plan to follow-up in 3 months. Seen by Dr. Aundra Dubin 06/12/21 at which time no changes were made to treatment plan and 3 month follow-up was recommended.  Today, she is here in a wheelchair accompanied by her husband. No specific complaints from husband. Patient points to her right lower leg and  grimaces. Husband states she has not been wearing her braces and he thinks she may have some discomfort from the way her foot is turned. Reports possibly giving her to 50 mg Toprol tablets daily, Rx is for 50 mg daily. Does not monitor BP at home, but has a cuff. Weight is stable. Limits fluids to < 64 oz daily. She denies chest pain, shortness of breath, lower extremity edema, palpitations, melena, diaphoresis, presyncope, syncope, orthopnea, and PND.  Past Medical History:  Diagnosis Date   Adenomatous polyp of colon 09/2012   repeat colonoscopy in 5 years by Dr. Sharlett Iles   CHF (congestive heart failure) ([place])    EF 15-20% as of 05/28/11 (Dr Legrand [facility] summary)   CVA (cerebral infarction) 12/2007   H/O heart bypass surgery 2022   Hemiparesis ([place])    Hyperlipidemia    Hypertension    Seizures ([place])    Sickle cell anemia ([place])    Stroke ([place])    Vitamin D deficiency 2010    Past Surgical History:  Procedure Laterality Date   ABDOMINAL HYSTERECTOMY     BIV PACEMAKER INSERTION CRT-P N/A 04/29/2020   Procedure: BIV PACEMAKER INSERTION CRT-P;  Surgeon: Constance Haw, MD;  Location: [place] [facility];  Service: Cardiovascular;  Laterality: N/A;   CORONARY ARTERY BYPASS GRAFT N/A 04/25/2020   Procedure: CORONARY ARTERY BYPASS GRAFTING (CABG) TIMES TWO USING LEFT INTERNAL MAMMARY ARTERY AND RIGHT GREATER SAPHENOUS VEIN HARVESTED ENDOSCOPICALLY;  Surgeon: Melrose Nakayama, MD;  Location: [place] [place];  Service: Open Heart Surgery;  Laterality: N/A;   FRACTURE SURGERY     HIP SURGERY     LEFT HEART CATH AND CORONARY ANGIOGRAPHY N/A 04/22/2020   Procedure: LEFT HEART CATH AND CORONARY ANGIOGRAPHY;  Surgeon: Larey Dresser, MD;  Location: [place] [facility];  Service: Cardiovascular;  Laterality: N/A;   TEE WITHOUT CARDIOVERSION  04/25/2020   Procedure: TRANSESOPHAGEAL ECHOCARDIOGRAM (TEE);  Surgeon: Melrose Nakayama, MD;  Location: [facility];  Service: Open Heart Surgery;;    TEMPORARY PACEMAKER N/A 04/21/2020   Procedure: TEMPORARY PACEMAKER;  Surgeon: Larey Dresser, MD;  Location: [place] [facility];  Service: Cardiovascular;  Laterality: N/A;   TUBAL LIGATION      Current Medications: Current Meds  Medication Sig   acetaminophen (TYLENOL) 325 MG tablet Take 650 mg by mouth 2 (two) times a week.   aspirin 81 MG chewable tablet Chew 1 tablet (81 mg total) by mouth daily.   atorvastatin (LIPITOR) 80 MG tablet Take 1 tablet (80 mg total) by mouth at bedtime. Needs appointment before anymore future refills.   Cholecalciferol 50 MCG (2000 UT) TABS Take 1 tablet by mouth daily in the afternoon.   furosemide (LASIX) 20 MG tablet TAKE 1 TABLET(20 MG) BY MOUTH EVERY OTHER DAY   JARDIANCE 10 MG TABS tablet TAKE 1 TABLET(10 MG) BY MOUTH DAILY BEFORE AND BREAKFAST   Lacosamide (VIMPAT) 100 MG TABS Take 1 tablet (100 mg total) by mouth in the morning and at bedtime.   levETIRAcetam (KEPPRA) 750 MG tablet Take 2 tablets (1,500 mg total) by mouth 2 (two) times daily.   metoprolol succinate (TOPROL-XL) 50 MG 24 hr tablet TAKE 1 TABLET(50 MG) BY MOUTH DAILY WITH OR IMMEDIATELY FOLLOWING A MEAL (Patient taking differently: Take 100 mg by mouth daily.)   Nutritional Supplements (NUTRITIONAL SUPPLEMENT PO) Take 120 mLs by mouth once. Medpass for weight gain.   sacubitril-valsartan (ENTRESTO) 97-103 MG Take 1 tablet by mouth 2 (two) times daily.   spironolactone (ALDACTONE) 25 MG tablet TAKE 1 TABLET(25 MG) BY MOUTH DAILY   warfarin (COUMADIN) 5 MG tablet Take 5 mg by mouth daily.     Allergies:   Lexiscan [regadenoson]   Social History   Socioeconomic History   Marital status: Married    Spouse name: [name]   Number of children: 2   Years of education: 12   Highest education level: Not on file  Occupational History    Employer: RETIRED  Tobacco Use   Smoking status: Former    Types: Cigarettes    Quit date: 09/26/2007    Years since quitting: 13.9   Smokeless  tobacco: Never  Vaping Use   Vaping Use: Never used  Substance and Sexual Activity   Alcohol use: Not Currently    Comment: Former drinker, 15 years ago   Drug use: Never   Sexual activity: Not Currently  Other Topics Concern   Not on file  Social History Narrative   Tobacco use, amount per day now: None.   Past tobacco use, amount per day: Everyday.   How many years did you use tobacco: 30 years.   Alcohol use (drinks per week): Wine Daily   Diet:   Do you drink/eat things with caffeine: Yes   Marital status: Married                                  What year were you  married?    Do you live in a house, apartment, assisted living, condo, trailer, etc.? House   Is it one or more stories? 1 story.   How many persons live in your home? 2   Do you have pets in your home?( please list) No   Highest Level of education completed? Associate   Current or past profession: Director    Do you exercise?    No                             Type and how often?   Do you have a living will?    Do you have a DNR form?                                   If not, do you want to discuss one?   Do you have signed POA/HPOA forms?                        If so, please bring to you appointment      Do you have any difficulty bathing or dressing yourself? Yes   Do you have any difficulty preparing food or eating? Yes   Do you have any difficulty managing your medications? Yes   Do you have any difficulty managing your finances? Yes   Do you have any difficulty affording your medications? Yes   Social Determinants of Health   Financial Resource Strain: Not on file  Food Insecurity: Not on file  Transportation Needs: Not on file  Physical Activity: Not on file  Stress: Not on file  Social Connections: Not on file     Family History: The patient's family history includes Asthma in her brother; Bronchitis in her brother; Colon cancer in her maternal grandfather; Diabetes in her daughter; Heart disease in  her daughter; High Cholesterol in her daughter and sister; Hypertension in her daughter and sister.  ROS:   Please see the history of present illness.    + right leg pain All other systems reviewed and are negative.  Labs/Other Studies Reviewed:    The following studies were reviewed today:  Echo 10/21/20   1. Left ventricular ejection fraction, by estimation, is 35%. The left  ventricle has moderately decreased function. The left ventricle  demonstrates regional wall motion abnormalities with basal to mid inferior  and inferolateral akinesis. Left  ventricular diastolic parameters are consistent with Grade III diastolic  dysfunction (restrictive).   2. Right ventricular systolic function is mildly reduced. The right  ventricular size is normal. There is normal pulmonary artery systolic  pressure. The estimated right ventricular systolic pressure is 83.1 mmHg.   3. Left atrial size was mildly dilated.   4. Pleural effusion noted.   5. Restricted posterior mitral leaflet. The mitral valve is abnormal.  Mild to moderate mitral valve regurgitation, suspect infarct-related MR.  No evidence of mitral stenosis.   6. Tricuspid valve regurgitation is moderate.   7. The aortic valve is tricuspid. Aortic valve regurgitation is mild.   8. The inferior vena cava is normal in size with <50% respiratory  variability, suggesting right atrial pressure of 8 mmHg.   LHC 04/22/20  Dist LM lesion is 70% stenosed. Ost Cx lesion is 70% stenosed. Prox Cx lesion is 99% stenosed.   1. LVEDP 7. 2. 60-70% distal left main  stenosis.  3. Heavily calcified proximal LCx.  60-70% stenosis at the ostium with discrete 99% stenosis in the proximal LCx.     I suspect that her CHB is related to chronic ischemia from LM-LCx disease.     Difficult situation.  She has surgical disease but seems to be a poor candidate for CABG with weakness and aphasia from prior stroke though per her husband, she does all ADLs and  walks with cane at home.  PCI would be possible but high risk/difficult heavy calcification and tortuousity.     I discussed the films with Dr. [place].  Will ask surgery to see for an opinion.  If they turn her down for CABG, would plan high risk PCI from LM into the proximal LCx on Monday or Tuesday.  Will need to keep TTVP in place.   Recent Labs: 10/21/2020: B Natriuretic Peptide 1,309.6 02/28/2021: ALT 8 06/12/2021: BUN 23; Creatinine, Ser 1.19; Hemoglobin 11.9; Platelets 224; Potassium 4.4; Sodium 140  Recent Lipid Panel    Component Value Date/Time   CHOL 126 06/12/2021 0935   CHOL 120 10/08/2019 1031   TRIG 63 06/12/2021 0935   HDL 46 06/12/2021 0935   HDL 39 (L) 10/08/2019 1031   CHOLHDL 2.7 06/12/2021 0935   VLDL 13 06/12/2021 0935   LDLCALC 67 06/12/2021 0935   LDLCALC 61 10/08/2019 1031     Risk Assessment/Calculations:           Physical Exam:    VS:  BP (!) 92/52   Pulse (!) 55   Ht '5\' 7"'$  (1.702 m)   Wt 125 lb (56.7 kg) Comment: reported wt; pt in wheelchair  SpO2 95%   BMI 19.58 kg/m     Wt Readings from Last 3 Encounters:  09/07/21 125 lb (56.7 kg)  06/12/21 122 lb 12.8 oz (55.7 kg)  05/15/21 121 lb (54.9 kg)     GEN: Chronically ill appearing female in no acute distress HEENT: Normal NECK: No JVD; No carotid bruits CARDIAC: RRR, no murmurs, rubs, gallops RESPIRATORY:  Clear to auscultation without rales, wheezing or rhonchi  ABDOMEN: Soft, non-tender, non-distended MUSCULOSKELETAL:  No edema; No deformity. 2+ pedal pulses, equal bilaterally SKIN: Warm and dry NEUROLOGIC:  Alert and oriented x 3 PSYCHIATRIC:  Normal affect   EKG:  EKG is not ordered today.    Diagnoses:    1. Complete heart block ([place])   2. Chronic combined systolic and diastolic heart failure ([place])   3. Hyperlipidemia LDL goal <70   4. Coronary artery disease involving native coronary artery of native heart without angina pectoris   5. H/O: CVA (cerebrovascular accident)    6. S/P CABG x 2    Assessment and Plan:     CAD s/p CABG without angina: LHC 04/2020 revealed left main and LCx disease, deemed poor candidate for PCI.  CABG x2 with LIMA-LAD, SVG-OM2. She denies chest pain, dyspnea, or other symptoms concerning for angina.  No indication for further ischemic evaluation at this time. No bleeding concerns on aspirin. Continue ASA, statin, metoprolol.    Complete heart block s/p PPM implant: St Jude CRT-P s/p CHB prior to CABG with underlying LAFB/RBBB. Felt to be 2/2 chronic ischemia fom left main/LCx disease worsened by nodal blockade. Continue remote device checks. Is due for follow-up with EP provider. Message sent to scheduling.   Hyperlipidemia LDL goal < 70: LDL 67 06/12/21. Continue atorvastatin.   Chronic combined CHF/ICM: LVEF 27%, grade 3 diastolic dysfunction, mildly reduced RV  function, basal to mid inferior and inferior lateral akinesis on echo 10/21/20. No indication of volume overload on exam. Symptomology difficult to assess, no concerns per husband. Limiting fluid. Weight is stable. Continue GDMT including Entresto, spironolactone, Toprol, Jardiance, Lasix. Has an appointment with AHF clinic next week, will defer lab testing to that visit.   Hypertension: History of hypertension.  BP is a little soft today.  I have asked husband to monitor on a consistent basis and bring readings to AHF clinic appointment.  History of stroke/Seizure disorder: CVA 2009. Hemiplegia of right side, aphasia. Ambulates with assistance at home, uses wheelchair for long distances. Had readmission when ran out of seizure medications, reportedly doing well on current therapy. On chronic anticoagulation with warfarin for embolic stroke. Management per neurology.    Disposition: Keep appointment with AHF clinic/needs EP follow-up/1 year with Dr. Marlou Porch     Medication Adjustments/Labs and Tests Ordered: Current medicines are reviewed at length with the patient today.   Concerns regarding medicines are outlined above.  No orders of the defined types were placed in this encounter.  No orders of the defined types were placed in this encounter.   Patient Instructions  Medication Instructions:   Please make sure you are only taking Toprol (50 mg) daily.   *If you need a refill on your cardiac medications before your next appointment, please call your pharmacy*   Lab Work:  None ordered.  If you have labs (blood work) drawn today and your tests are completely normal, you will receive your results only by: [place] (if you have MyChart) OR A paper copy in the mail If you have any lab test that is abnormal or we need to change your treatment, we will call you to review the results.   Testing/Procedures:  None ordered.   Follow-Up: At [facility], you and your health needs are our priority.  As part of our continuing mission to provide you with exceptional heart care, we have created designated Provider Care Teams.  These Care Teams include your primary Cardiologist (physician) and Advanced Practice Providers (APPs -  Physician Assistants and Nurse Practitioners) who all work together to provide you with the care you need, when you need it.  We recommend signing up for the patient portal called "MyChart".  Sign up information is provided on this After Visit Summary.  MyChart is used to connect with patients for Virtual Visits (Telemedicine).  Patients are able to view lab/test results, encounter notes, upcoming appointments, etc.  Non-urgent messages can be sent to your provider as well.   To learn more about what you can do with MyChart, go to [email or web address].    Your next appointment:   1 year(s)  The format for your next appointment:   In Person  Provider:   Candee Furbish, MD     Other Instructions  Your physician wants you to follow-up in: 1 year with Dr.Skains. You will receive a reminder letter in the mail two months in  advance. If you don't receive a letter, please call our office to schedule the follow-up appointment.  HOW TO TAKE YOUR BLOOD PRESSURE: Rest 5 minutes before taking your blood pressure.  Don't smoke or drink caffeinated beverages for at least 30 minutes before. Take your blood pressure before (not after) you eat. Sit comfortably with your back supported and both feet on the floor (don't cross your legs). Elevate your arm to heart level on a table or a desk. Use the proper sized cuff.  It should fit smoothly and snugly around your bare upper arm. There should be enough room to slip a fingertip under the cuff. The bottom edge of the cuff should be 1 inch above the crease of the elbow.   Blood Pressure Record Sheet To take your blood pressure, you will need a blood pressure machine. You may be prescribed one, or you can buy a blood pressure machine (blood pressure monitor) at your clinic, drug store, or online. When choosing one, look for these features: An automatic monitor that has an arm cuff. A cuff that wraps snugly, but not too tightly, around your upper arm. You should be able to fit only one finger between your arm and the cuff. A device that stores blood pressure reading results. Do not choose a monitor that measures your blood pressure from your wrist or finger. Follow your health care provider's instructions for how to take your blood pressure. To use this form: Get one reading in the morning (a.m.) before you take any medicines. Get one reading in the evening (p.m.) before supper. Take at least two readings with each blood pressure check. This makes sure the results are correct. Wait 1-2 minutes between measurements. Write down the results in the spaces on this form. Repeat this once a week, or as told by your health care provider. Make a follow-up appointment with your health care provider to discuss the results. Blood pressure log Date: _______________________ a.m.  _____________________(1st reading) _____________________(2nd reading) p.m. _____________________(1st reading) _____________________(2nd reading) Date: _______________________ a.m. _____________________(1st reading) _____________________(2nd reading) p.m. _____________________(1st reading) _____________________(2nd reading) Date: _______________________ a.m. _____________________(1st reading) _____________________(2nd reading) p.m. _____________________(1st reading) _____________________(2nd reading) Date: _______________________ a.m. _____________________(1st reading) _____________________(2nd reading) p.m. _____________________(1st reading) _____________________(2nd reading) Date: _______________________ a.m. _____________________(1st reading) _____________________(2nd reading) p.m. _____________________(1st reading) _____________________(2nd reading) This information is not intended to replace advice given to you by your health care provider. Make sure you discuss any questions you have with your health care provider. Document Revised: 09/22/2020 Document Reviewed: 09/22/2020 Elsevier Patient Education  [place] THIS SHEET TO YOUR NEXT DOCTORS APPOINTMENT.    Important Information About Sugar         Signed, Emmaline Life, NP  09/07/2021 2:20 PM    [facility]

## 2021-09-07 ENCOUNTER — Ambulatory Visit: Payer: Medicare HMO | Admitting: Nurse Practitioner

## 2021-09-07 ENCOUNTER — Encounter: Payer: Self-pay | Admitting: Nurse Practitioner

## 2021-09-07 VITALS — BP 92/52 | HR 55 | Ht 67.0 in | Wt 125.0 lb

## 2021-09-07 DIAGNOSIS — Z8673 Personal history of transient ischemic attack (TIA), and cerebral infarction without residual deficits: Secondary | ICD-10-CM | POA: Diagnosis not present

## 2021-09-07 DIAGNOSIS — I5042 Chronic combined systolic (congestive) and diastolic (congestive) heart failure: Secondary | ICD-10-CM

## 2021-09-07 DIAGNOSIS — I442 Atrioventricular block, complete: Secondary | ICD-10-CM | POA: Diagnosis not present

## 2021-09-07 DIAGNOSIS — I2510 Atherosclerotic heart disease of native coronary artery without angina pectoris: Secondary | ICD-10-CM | POA: Diagnosis not present

## 2021-09-07 DIAGNOSIS — Z951 Presence of aortocoronary bypass graft: Secondary | ICD-10-CM | POA: Diagnosis not present

## 2021-09-07 DIAGNOSIS — E785 Hyperlipidemia, unspecified: Secondary | ICD-10-CM | POA: Diagnosis not present

## 2021-09-07 DIAGNOSIS — Z950 Presence of cardiac pacemaker: Secondary | ICD-10-CM | POA: Diagnosis not present

## 2021-09-07 NOTE — Patient Instructions (Signed)
Medication Instructions:   Please make sure you are only taking Toprol (50 mg) daily.   *If you need a refill on your cardiac medications before your next appointment, please call your pharmacy*   Lab Work:  None ordered.  If you have labs (blood work) drawn today and your tests are completely normal, you will receive your results only by: [place] (if you have MyChart) OR A paper copy in the mail If you have any lab test that is abnormal or we need to change your treatment, we will call you to review the results.   Testing/Procedures:  None ordered.   Follow-Up: At [facility], you and your health needs are our priority.  As part of our continuing mission to provide you with exceptional heart care, we have created designated Provider Care Teams.  These Care Teams include your primary Cardiologist (physician) and Advanced Practice Providers (APPs -  Physician Assistants and Nurse Practitioners) who all work together to provide you with the care you need, when you need it.  We recommend signing up for the patient portal called "MyChart".  Sign up information is provided on this After Visit Summary.  MyChart is used to connect with patients for Virtual Visits (Telemedicine).  Patients are able to view lab/test results, encounter notes, upcoming appointments, etc.  Non-urgent messages can be sent to your provider as well.   To learn more about what you can do with MyChart, go to [email or web address].    Your next appointment:   1 year(s)  The format for your next appointment:   In Person  Provider:   Candee Furbish, MD     Other Instructions  Your physician wants you to follow-up in: 1 year with Dr.Skains. You will receive a reminder letter in the mail two months in advance. If you don't receive a letter, please call our office to schedule the follow-up appointment.  HOW TO TAKE YOUR BLOOD PRESSURE: Rest 5 minutes before taking your blood pressure.  Don't smoke or  drink caffeinated beverages for at least 30 minutes before. Take your blood pressure before (not after) you eat. Sit comfortably with your back supported and both feet on the floor (don't cross your legs). Elevate your arm to heart level on a table or a desk. Use the proper sized cuff. It should fit smoothly and snugly around your bare upper arm. There should be enough room to slip a fingertip under the cuff. The bottom edge of the cuff should be 1 inch above the crease of the elbow.   Blood Pressure Record Sheet To take your blood pressure, you will need a blood pressure machine. You may be prescribed one, or you can buy a blood pressure machine (blood pressure monitor) at your clinic, drug store, or online. When choosing one, look for these features: An automatic monitor that has an arm cuff. A cuff that wraps snugly, but not too tightly, around your upper arm. You should be able to fit only one finger between your arm and the cuff. A device that stores blood pressure reading results. Do not choose a monitor that measures your blood pressure from your wrist or finger. Follow your health care provider's instructions for how to take your blood pressure. To use this form: Get one reading in the morning (a.m.) before you take any medicines. Get one reading in the evening (p.m.) before supper. Take at least two readings with each blood pressure check. This makes sure the results are correct. Wait 1-2 minutes  between measurements. Write down the results in the spaces on this form. Repeat this once a week, or as told by your health care provider. Make a follow-up appointment with your health care provider to discuss the results. Blood pressure log Date: _______________________ a.m. _____________________(1st reading) _____________________(2nd reading) p.m. _____________________(1st reading) _____________________(2nd reading) Date: _______________________ a.m. _____________________(1st reading)  _____________________(2nd reading) p.m. _____________________(1st reading) _____________________(2nd reading) Date: _______________________ a.m. _____________________(1st reading) _____________________(2nd reading) p.m. _____________________(1st reading) _____________________(2nd reading) Date: _______________________ a.m. _____________________(1st reading) _____________________(2nd reading) p.m. _____________________(1st reading) _____________________(2nd reading) Date: _______________________ a.m. _____________________(1st reading) _____________________(2nd reading) p.m. _____________________(1st reading) _____________________(2nd reading) This information is not intended to replace advice given to you by your health care provider. Make sure you discuss any questions you have with your health care provider. Document Revised: 09/22/2020 Document Reviewed: 09/22/2020 Elsevier Patient Education  [place] THIS SHEET TO YOUR NEXT DOCTORS APPOINTMENT.    Important Information About Sugar

## 2021-09-11 NOTE — Progress Notes (Signed)
PCP: [name], MD HF Cardiology: Dr. Aundra Dubin  71 y.o. with history of CVA, chronic systolic CHF, CHB, and CAD returns for followup of CHF.   She had a stroke in 2009, thought to be cardioembolic.  She has had right hemiparesis and aphasia since that time.   [name] was hospitalized in August 2020 with a seizure after being out of her medications for 4 days.  She required intubation.  High-sensitivity troponin trended to 1905. An echocardiogram 08/2018 revealed EF of 35 to 40% with global hypokinesis and moderate LVH, elevated LVEDP.  She also had moderate AR. Nuclear stress test in August 2020 showed a large severe fixed inferior, anterior apical, and inferolateral perfusion defect suggestive of scar without ischemia.  Study was high risk due to EF estimated at 34% but mentions no significant reversible ischemia. She was deemed not a good candidate for cardiac catheterization during that hospitalization.    Patient was admitted in 3/22 following a syncopal episode and was found to be in complete heart block.  This improved initially after stopping nodal blockers.  Echo in 3/22 showed EF 35-40%.  LHC showed significant left main and LCx disease.  She was deemed to be a poor candidate for PCI.  In 4/22, she had CABG with LIMA-LAD and SVG-OM2.  Post-op, she developed CHB again.  St Jude CRT-P device was placed. She was discharged to a rehab facility.   Echo in 9/22 showed EF 35% with basal to mid inferior and inferolateral AK, mildly decreased RV function, mild-moderate MR.    Today she returns for HF follow up with her husband. He helps with history due to her aphasia. Overall feeling fine. She is able to walk around the house with her cane without significant dyspnea. Upon questioning, she indicates she has dysuria. Denies abnormal bleeding, CP, dizziness, edema, or PND/Orthopnea. Appetite ok. No fever or chills. She does not weigh at home. Taking all medications. BP readings at home have been  low, 90s/40s.  Device interrogation (personally reviewed): 96% BiV pacing, < 1% AT/AF burden, CorVue stable  Labs (4/22): K 3.9, creatinine 0.83, hgb 9.4 Labs (7/22): K 4.2, creatinine 0.93 Labs (9/22): LDL 67 Labs (2/23): K 4.5, creatinine 1.12 Labs (3/23): K 4.5, creatinine 0.96 Labs (5/23): K 4.4, creatinine 1.19, LDL 67, HDL 46  ECG (personally reviewed): none ordered today.  PMH: 1. H/o seizure disorder.  2. CVA: 2009 with aphasia and right hemiparesis.  3. Complete heart block: St Jude CRT-P. 4. Chronic systolic CHF: Ischemic cardiomyopathy. St Jude CRT-P device.  - Echo (3/22): EF 35-40%, inferior and lateral HK, normal RV.  - Coronary angiography (4/22): 60-70% dLM, 60-70% ostial LCx, 99% proximal calcified LCx (LCx was large, dominant vessel).  - Echo (9/22): EF 35% with basal to mid inferior and inferolateral AK, mildly decreased RV function, mild-moderate MR. 5. CAD:  Coronary angiography (4/22) with 60-70% dLM, 60-70% ostial LCx, 99% proximal calcified LCx (LCx was large, dominant vessel). - CABG x 4 in 4/22: LIMA-LAD, SVG-OM2.  6. CHB: St Jude CRT-P placed in 4/22.   Social History   Socioeconomic History   Marital status: Married    Spouse name: [name]   Number of children: 2   Years of education: 12   Highest education level: Not on file  Occupational History    Employer: RETIRED  Tobacco Use   Smoking status: Former    Types: Cigarettes    Quit date: 09/26/2007    Years since quitting: 59.9  Smokeless tobacco: Never  Vaping Use   Vaping Use: Never used  Substance and Sexual Activity   Alcohol use: Not Currently    Comment: Former drinker, 15 years ago   Drug use: Never   Sexual activity: Not Currently  Other Topics Concern   Not on file  Social History Narrative   Tobacco use, amount per day now: None.   Past tobacco use, amount per day: Everyday.   How many years did you use tobacco: 30 years.   Alcohol use (drinks per week): Wine Daily   Diet:    Do you drink/eat things with caffeine: Yes   Marital status: Married                                  What year were you married?    Do you live in a house, apartment, assisted living, condo, trailer, etc.? House   Is it one or more stories? 1 story.   How many persons live in your home? 2   Do you have pets in your home?( please list) No   Highest Level of education completed? Associate   Current or past profession: Director    Do you exercise?    No                             Type and how often?   Do you have a living will?    Do you have a DNR form?                                   If not, do you want to discuss one?   Do you have signed POA/HPOA forms?                        If so, please bring to you appointment      Do you have any difficulty bathing or dressing yourself? Yes   Do you have any difficulty preparing food or eating? Yes   Do you have any difficulty managing your medications? Yes   Do you have any difficulty managing your finances? Yes   Do you have any difficulty affording your medications? Yes   Social Determinants of Health   Financial Resource Strain: Not on file  Food Insecurity: Not on file  Transportation Needs: Not on file  Physical Activity: Not on file  Stress: Not on file  Social Connections: Not on file  Intimate Partner Violence: Not on file   Family History  Problem Relation Age of Onset   Diabetes Daughter    High Cholesterol Daughter    Hypertension Daughter    Heart disease Daughter    Hypertension Sister    High Cholesterol Sister    Asthma Brother    Bronchitis Brother    Colon cancer Maternal Grandfather    ROS: All systems reviewed and negative except as per HPI.   Current Outpatient Medications  Medication Sig Dispense Refill   acetaminophen (TYLENOL) 325 MG tablet Take 650 mg by mouth 2 (two) times a week. As needed     aspirin 81 MG chewable tablet Chew 1 tablet (81 mg total) by mouth daily. 30 tablet 6   atorvastatin  (LIPITOR) 80 MG tablet Take 1 tablet (80 mg total) by mouth  at bedtime. Needs appointment before anymore future refills. 90 tablet 1   Cholecalciferol 50 MCG (2000 UT) TABS Take 1 tablet by mouth daily in the afternoon.     furosemide (LASIX) 20 MG tablet TAKE 1 TABLET(20 MG) BY MOUTH EVERY OTHER DAY 90 tablet 3   JARDIANCE 10 MG TABS tablet TAKE 1 TABLET(10 MG) BY MOUTH DAILY BEFORE AND BREAKFAST 30 tablet 8   Lacosamide (VIMPAT) 100 MG TABS Take 1 tablet (100 mg total) by mouth in the morning and at bedtime. 60 tablet 5   levETIRAcetam (KEPPRA) 750 MG tablet Take 2 tablets (1,500 mg total) by mouth 2 (two) times daily. 360 tablet 3   metoprolol succinate (TOPROL-XL) 50 MG 24 hr tablet TAKE 1 TABLET(50 MG) BY MOUTH DAILY WITH OR IMMEDIATELY FOLLOWING A MEAL (Patient taking differently: Take 100 mg by mouth 2 (two) times daily.) 90 tablet 3   Nutritional Supplements (NUTRITIONAL SUPPLEMENT PO) Take 120 mLs by mouth once. Medpass for weight gain.     sacubitril-valsartan (ENTRESTO) 97-103 MG Take 1 tablet by mouth 2 (two) times daily. 60 tablet 11   spironolactone (ALDACTONE) 25 MG tablet TAKE 1 TABLET(25 MG) BY MOUTH DAILY 45 tablet 3   warfarin (COUMADIN) 5 MG tablet Take 5 mg by mouth daily.     No current facility-administered medications for this encounter.   Wt Readings from Last 3 Encounters:  09/12/21 55.2 kg (121 lb 12.8 oz)  09/07/21 56.7 kg (125 lb)  06/12/21 55.7 kg (122 lb 12.8 oz)   BP 100/62   Pulse 64   Wt 55.2 kg (121 lb 12.8 oz)   SpO2 98%   BMI 19.08 kg/m  Physical Exam: General:  NAD. No resp difficulty, arrived in [facility] HEENT: Normal Neck: Supple. No JVD. Carotids 2+ bilat; no bruits. No lymphadenopathy or thryomegaly appreciated. Cor: PMI nondisplaced. Regular rate & rhythm. No rubs, gallops or murmurs. Lungs: Clear Abdomen: Soft, nontender, nondistended. No hepatosplenomegaly. No bruits or masses. Good bowel sounds. Extremities: No cyanosis, clubbing, rash,  edema Neuro: Alert & oriented x 3, cranial nerves grossly intact. Moves all 4 extremities w/o difficulty. Affect pleasant. + aphasia and RUE hemiparesis.  Asssessment/Plan: 1. Complete heart block: Patient had baseline NSR with LAFB/RBBB, so there was underlying conduction system disease.  She was transiently in CHB pre-CABG and developed persistent CHB post-CABG.  She now has [employer] CRT-P device.   - She needs follow up with EP.  2. Chronic systolic CHF: Ischemic cardiomyopathy.  Echo in 8/20 with EF 35-40% and wall motion abnormalities. LHC in 4/22 showed left main/severe codominant LCx disease.  Echo in 3/22 with EF 35-40%, inferior and lateral hypokinesis.  Echo in 9/22 with EF 35%, mild RV dysfunction.  Now s/p CABG.  Probably NYHA class II.  She is not volume overloaded on exam.  - Decrease Toprol to 50 mg daily with low home BP readings. - Continue Entresto 97/103 bid.  BMET today.   - Continue spironolactone 25 mg daily.    - Continue Jardiance 10 mg daily for now (see #6). - Continue Lasix 20 mg every other day.  3. 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  Has been on warfarin.  Has had long-standing aphasia and right hemiparesis.   - Continue warfarin. No bleeding issues. 4. H/o seizure disorder: Continue Keppra.  5. CAD:  LHC showed 60-70% distal left main stenosis, heavily calcified proximal LCx with 60-70% stenosis at the ostium and discrete 99% stenosis in the proximal  LCx.  I suspect that her CHB is related to chronic ischemia from LM-LCx disease worsened by nodal blockade. Had CABG x 2 4/22 w/ LIMA-LAD, SVG-OM2.  No chest pain.  - Continue ASA 81 daily.  - Continue statin, good lipids 5/23. 6. Dysuria: She is unable to provide urine sample today. I asked her husband to call PCP and see if she can get in for u/a. I asked her husband to call  us if she is + for UTI and/or yeast infection, will then stop Jardiance.  - Check CBC today.  Follow up in 3-4 months with Dr. Aundra Dubin.    Maricela [facility] FNP-BC 09/12/2021

## 2021-09-12 ENCOUNTER — Encounter (HOSPITAL_COMMUNITY): Payer: Self-pay

## 2021-09-12 ENCOUNTER — Ambulatory Visit (HOSPITAL_COMMUNITY)
Admission: RE | Admit: 2021-09-12 | Discharge: 2021-09-12 | Disposition: A | Discharge status: Home / Self Care | Payer: Medicare HMO | Source: Ambulatory Visit | Attending: Family Medicine | Admitting: Family Medicine

## 2021-09-12 VITALS — BP 100/62 | HR 64 | Wt 121.8 lb

## 2021-09-12 DIAGNOSIS — I69320 Aphasia following cerebral infarction: Secondary | ICD-10-CM | POA: Insufficient documentation

## 2021-09-12 DIAGNOSIS — Z8673 Personal history of transient ischemic attack (TIA), and cerebral infarction without residual deficits: Secondary | ICD-10-CM | POA: Diagnosis not present

## 2021-09-12 DIAGNOSIS — I5042 Chronic combined systolic (congestive) and diastolic (congestive) heart failure: Secondary | ICD-10-CM | POA: Diagnosis not present

## 2021-09-12 DIAGNOSIS — I69351 Hemiplegia and hemiparesis following cerebral infarction affecting right dominant side: Secondary | ICD-10-CM | POA: Insufficient documentation

## 2021-09-12 DIAGNOSIS — R300 Dysuria: Secondary | ICD-10-CM

## 2021-09-12 DIAGNOSIS — I5022 Chronic systolic (congestive) heart failure: Secondary | ICD-10-CM | POA: Diagnosis not present

## 2021-09-12 DIAGNOSIS — I2510 Atherosclerotic heart disease of native coronary artery without angina pectoris: Secondary | ICD-10-CM | POA: Diagnosis not present

## 2021-09-12 DIAGNOSIS — G40909 Epilepsy, unspecified, not intractable, without status epilepticus: Secondary | ICD-10-CM | POA: Diagnosis not present

## 2021-09-12 DIAGNOSIS — Z951 Presence of aortocoronary bypass graft: Secondary | ICD-10-CM | POA: Diagnosis not present

## 2021-09-12 DIAGNOSIS — I255 Ischemic cardiomyopathy: Secondary | ICD-10-CM | POA: Diagnosis not present

## 2021-09-12 DIAGNOSIS — I442 Atrioventricular block, complete: Secondary | ICD-10-CM | POA: Diagnosis not present

## 2021-09-12 DIAGNOSIS — F17210 Nicotine dependence, cigarettes, uncomplicated: Secondary | ICD-10-CM | POA: Insufficient documentation

## 2021-09-12 DIAGNOSIS — I69959 Hemiplegia and hemiparesis following unspecified cerebrovascular disease affecting unspecified side: Secondary | ICD-10-CM

## 2021-09-12 LAB — BASIC METABOLIC PANEL
Anion gap: 10 (ref 5–15)
BUN: 19 mg/dL (ref 8–23)
CO2: 22 mmol/L (ref 22–32)
Calcium: 9.2 mg/dL (ref 8.9–10.3)
Chloride: 108 mmol/L (ref 98–111)
Creatinine, Ser: 1.14 mg/dL — ABNORMAL HIGH (ref 0.44–1.00)
GFR, Estimated: 51 mL/min — ABNORMAL LOW (ref >60)
Glucose, Bld: 88 mg/dL (ref 70–99)
Potassium: 4.4 mmol/L (ref 3.5–5.1)
Sodium: 140 mmol/L (ref 135–145)

## 2021-09-12 LAB — CBC
HCT: 38.1 % (ref 36.0–46.0)
Hemoglobin: 12.3 g/dL (ref 12.0–15.0)
MCH: 28.9 pg (ref 26.0–34.0)
MCHC: 32.3 g/dL (ref 30.0–36.0)
MCV: 89.6 fL (ref 80.0–100.0)
Platelets: 259 10*3/uL (ref 150–400)
RBC: 4.25 MIL/uL (ref 3.87–5.11)
RDW: 13.2 % (ref 11.5–15.5)
WBC: 5.3 10*3/uL (ref 4.0–10.5)
nRBC: 0 % (ref 0.0–0.2)

## 2021-09-12 MED ORDER — METOPROLOL SUCCINATE ER 50 MG PO TB24: 50.0000 mg | ORAL_TABLET | Freq: Every day | ORAL | 3 refills | Status: DC

## 2021-09-12 NOTE — Patient Instructions (Signed)
DECREASE Toprol to 50 mg one tab daily  Labs today We will only contact you if something comes back abnormal or we need to make some changes. Otherwise no news is good news!  Please contact PCP for urinalysis and be sure to contact our office if sample shows UTI and or yeast infection. Your provider may decide to stop Jardiance.   You have been referred to [facility] Electrophysiology with Dr Curt Bears They will be in contact with an appointment   Your physician recommends that you schedule a follow-up appointment in: 4 months with Dr Aundra Dubin   Do the following things EVERYDAY: Weigh yourself in the morning before breakfast. Write it down and keep it in a log. Take your medicines as prescribed Eat low salt foods--Limit salt (sodium) to 2000 mg per day.  Stay as active as you can everyday Limit all fluids for the day to less than 2 liters   At the [facility], you and your health needs are our priority. As part of our continuing mission to provide you with exceptional heart care, we have created designated Provider Care Teams. These Care Teams include your primary Cardiologist (physician) and Advanced Practice Providers (APPs- Physician Assistants and Nurse Practitioners) who all work together to provide you with the care you need, when you need it.   You may see any of the following providers on your designated Care Team at your next follow up: Dr Glori Bickers Dr Haynes Kerns, NP Lyda Jester, [place] [facility], [place] Audry Riles, PharmD   Please be sure to bring in all your medications bottles to every appointment.   If you have any questions or concerns before your next appointment please send [place] a message through [place] or call our office at [phone].    TO LEAVE A MESSAGE FOR THE NURSE SELECT OPTION 2, PLEASE LEAVE A MESSAGE INCLUDING: YOUR NAME DATE OF BIRTH CALL BACK NUMBER REASON FOR CALL**this is important as we  prioritize the call backs  YOU WILL RECEIVE A CALL BACK THE SAME DAY AS LONG AS YOU CALL BEFORE 4:00 PM

## 2021-09-13 ENCOUNTER — Encounter: Payer: Self-pay | Admitting: Family Medicine

## 2021-09-13 ENCOUNTER — Ambulatory Visit (INDEPENDENT_AMBULATORY_CARE_PROVIDER_SITE_OTHER): Payer: Medicare HMO | Admitting: Family Medicine

## 2021-09-13 VITALS — BP 122/70 | HR 65 | Temp 96.9°F | Resp 20

## 2021-09-13 DIAGNOSIS — R300 Dysuria: Secondary | ICD-10-CM

## 2021-09-13 LAB — POCT URINALYSIS DIPSTICK
Bilirubin, UA: NEGATIVE
Blood, UA: NEGATIVE
Glucose, UA: POSITIVE — AB
Ketones, UA: NEGATIVE
Nitrite, UA: NEGATIVE
Protein, UA: POSITIVE — AB
Spec Grav, UA: 1.015 (ref 1.010–1.025)
Urobilinogen, UA: 0.2 E.U./dL
pH, UA: 7.5 (ref 5.0–8.0)

## 2021-09-14 LAB — URINE CULTURE
MICRO NUMBER:: 13821473
SPECIMEN QUALITY:: ADEQUATE

## 2021-09-19 DIAGNOSIS — R300 Dysuria: Secondary | ICD-10-CM | POA: Insufficient documentation

## 2021-09-19 NOTE — Progress Notes (Signed)
Provider:  Alain Honey, MD  Careteam: Patient Care Team: [name], MD as PCP - General (Family Medicine) Constance Haw, MD as PCP - Electrophysiology (Cardiology) Jerline Pain, MD as PCP - Cardiology (Cardiology) Adrian Prows, MD as Attending Physician (Cardiology) Burnard Bunting, MD (Internal Medicine) Skeet Latch, MD as Attending Physician (Cardiology) Leta Baptist, Earlean Polka, MD as Consulting Physician (Neurology)  PLACE OF SERVICE:  [place] Directive information    Allergies  Allergen Reactions   Lexiscan [Regadenoson] Other (See Comments)    Perioral tingling and throat tightness    Chief Complaint  Patient presents with   Acute Visit    Patient presents today for possible urinary tract infection or yeast infection per husband for about 1 week now. He states she keeps complaining about burning when urinating.     HPI: Patient is a 71 y.o. female patient is here with her husband who helps in translation.  She is aphasic secondary to old CVA.  Apparently she is having some dysuria and frequency.  She denies itching that might suggest yeast infection.  She has had no fever chills nausea or vomiting.  Review of Systems:  Review of Systems  Genitourinary:  Positive for dysuria and frequency.  All other systems reviewed and are negative.   Past Medical History:  Diagnosis Date   Adenomatous polyp of colon 09/2012   repeat colonoscopy in 5 years by Dr. Sharlett Iles   CHF (congestive heart failure) ([place])    EF 15-20% as of 05/28/11 (Dr Legrand [facility] summary)   CVA (cerebral infarction) 12/2007   H/O heart bypass surgery 2022   Hemiparesis ([place])    Hyperlipidemia    Hypertension    Seizures ([place])    Sickle cell anemia ([place])    Stroke ([place])    Vitamin D deficiency 2010   Past Surgical History:  Procedure Laterality Date   ABDOMINAL HYSTERECTOMY     BIV PACEMAKER INSERTION CRT-P N/A 04/29/2020   Procedure: BIV  PACEMAKER INSERTION CRT-P;  Surgeon: Constance Haw, MD;  Location: [place] [facility];  Service: Cardiovascular;  Laterality: N/A;   CORONARY ARTERY BYPASS GRAFT N/A 04/25/2020   Procedure: CORONARY ARTERY BYPASS GRAFTING (CABG) TIMES TWO USING LEFT INTERNAL MAMMARY ARTERY AND RIGHT GREATER SAPHENOUS VEIN HARVESTED ENDOSCOPICALLY;  Surgeon: Melrose Nakayama, MD;  Location: [facility];  Service: Open Heart Surgery;  Laterality: N/A;   FRACTURE SURGERY     HIP SURGERY     LEFT HEART CATH AND CORONARY ANGIOGRAPHY N/A 04/22/2020   Procedure: LEFT HEART CATH AND CORONARY ANGIOGRAPHY;  Surgeon: Larey Dresser, MD;  Location: [place] [facility];  Service: Cardiovascular;  Laterality: N/A;   TEE WITHOUT CARDIOVERSION  04/25/2020   Procedure: TRANSESOPHAGEAL ECHOCARDIOGRAM (TEE);  Surgeon: Melrose Nakayama, MD;  Location: [facility] OR;  Service: Open Heart Surgery;;   TEMPORARY PACEMAKER N/A 04/21/2020   Procedure: TEMPORARY PACEMAKER;  Surgeon: Larey Dresser, MD;  Location: [place] [facility];  Service: Cardiovascular;  Laterality: N/A;   TUBAL LIGATION     Social History:   reports that she quit smoking about 13 years ago. Her smoking use included cigarettes. She has never used smokeless tobacco. She reports that she does not currently use alcohol. She reports that she does not use drugs.  Family History  Problem Relation Age of Onset   Diabetes Daughter    High Cholesterol Daughter    Hypertension Daughter    Heart disease Daughter  Hypertension Sister    High Cholesterol Sister    Asthma Brother    Bronchitis Brother    Colon cancer Maternal Grandfather     Medications: Patient's Medications  New Prescriptions   No medications on file  Previous Medications   ACETAMINOPHEN (TYLENOL) 325 MG TABLET    Take 650 mg by mouth 2 (two) times a week. As needed   ASPIRIN 81 MG CHEWABLE TABLET    Chew 1 tablet (81 mg total) by mouth daily.   ATORVASTATIN (LIPITOR) 80 MG TABLET    Take 1  tablet (80 mg total) by mouth at bedtime. Needs appointment before anymore future refills.   CHOLECALCIFEROL 50 MCG (2000 UT) TABS    Take 1 tablet by mouth daily in the afternoon.   FUROSEMIDE (LASIX) 20 MG TABLET    TAKE 1 TABLET(20 MG) BY MOUTH EVERY OTHER DAY   JARDIANCE 10 MG TABS TABLET    TAKE 1 TABLET(10 MG) BY MOUTH DAILY BEFORE AND BREAKFAST   LACOSAMIDE (VIMPAT) 100 MG TABS    Take 1 tablet (100 mg total) by mouth in the morning and at bedtime.   LEVETIRACETAM (KEPPRA) 750 MG TABLET    Take 2 tablets (1,500 mg total) by mouth 2 (two) times daily.   METOPROLOL SUCCINATE (TOPROL-XL) 50 MG 24 HR TABLET    Take 1 tablet (50 mg total) by mouth daily. Take with or immediately following a meal.   NUTRITIONAL SUPPLEMENTS (NUTRITIONAL SUPPLEMENT PO)    Take 120 mLs by mouth once. Medpass for weight gain.   SACUBITRIL-VALSARTAN (ENTRESTO) 97-103 MG    Take 1 tablet by mouth 2 (two) times daily.   SPIRONOLACTONE (ALDACTONE) 25 MG TABLET    TAKE 1 TABLET(25 MG) BY MOUTH DAILY   WARFARIN (COUMADIN) 5 MG TABLET    Take 5 mg by mouth daily.  Modified Medications   No medications on file  Discontinued Medications   No medications on file    Physical Exam:  Vitals:   09/13/21 1041  BP: 122/70  Pulse: 65  Resp: 20  Temp: (!) 96.9 F (36.1 C)  SpO2: 98%   There is no height or weight on file to calculate BMI. Wt Readings from Last 3 Encounters:  09/12/21 121 lb 12.8 oz (55.2 kg)  09/07/21 125 lb (56.7 kg)  06/12/21 122 lb 12.8 oz (55.7 kg)    Physical Exam Vitals and nursing note reviewed.  Cardiovascular:     Rate and Rhythm: Normal rate and regular rhythm.  Pulmonary:     Effort: Pulmonary effort is normal.     Breath sounds: Normal breath sounds.  Abdominal:     General: Abdomen is flat. Bowel sounds are normal.     Palpations: Abdomen is soft.  Neurological:     Mental Status: She is alert.  Psychiatric:        Mood and Affect: Mood normal.     Labs reviewed: Basic  Metabolic Panel: Recent Labs    03/24/21 0926 06/12/21 0935 09/12/21 1039  NA 138 140 140  K 4.5 4.4 4.4  CL 106 110 108  CO2 '24 23 22  '$ GLUCOSE 87 91 88  BUN '19 23 19  '$ CREATININE 0.96 1.19* 1.14*  CALCIUM 9.1 9.1 9.2   Liver Function Tests: Recent Labs    02/28/21 1038  AST 21  ALT 8  ALKPHOS 123*  BILITOT 0.3  PROT 7.5  ALBUMIN 4.6   No results for input(s): "LIPASE", "AMYLASE" in the last 8760  hours. No results for input(s): "AMMONIA" in the last 8760 hours. CBC: Recent Labs    02/28/21 1038 06/12/21 0935 09/12/21 1039  WBC 5.6 5.1 5.3  HGB 11.9 11.9* 12.3  HCT 37.2 38.1 38.1  MCV 87 90.7 89.6  PLT 257 224 259   Lipid Panel: Recent Labs    10/21/20 1020 06/12/21 0935  CHOL 132 126  HDL 53 46  LDLCALC 67 67  TRIG 61 63  CHOLHDL 2.5 2.7   TSH: No results for input(s): "TSH" in the last 8760 hours. A1C: Lab Results  Component Value Date   HGBA1C 6.1 (H) 04/22/2020     Assessment/Plan  1. Burning with urination Patient unable to provide urine specimen in office.  Husband given specimen cup to take home and return Dipstick urine is suggestive of infection but culture subsequently proved negative Plan would be to increase fluid intake     Alain Honey, MD [facility] [phone]

## 2021-10-09 ENCOUNTER — Other Ambulatory Visit: Payer: Self-pay | Admitting: *Deleted

## 2021-10-09 MED ORDER — EMPAGLIFLOZIN 10 MG PO TABS: 10.0000 mg | ORAL_TABLET | Freq: Every day | ORAL | 11 refills | Status: DC

## 2021-10-20 ENCOUNTER — Ambulatory Visit: Payer: Self-pay

## 2021-10-20 NOTE — Patient Instructions (Signed)
Visit Information  Thank you for taking time to visit with me today. Please don't hesitate to contact me if I can be of assistance to you.   Following are the goals we discussed today:   Goals Addressed             This Visit's Progress    COMPLETED: Care Coordination Activities       Care Coordination Interventions: SDoH screening completed - no acute resource needs identified Determined the patient does not have concerns with medication costs at this time Discussed spouse is having a hard time getting Jardiance refilled Performed chart review to note Dr. Aundra Dubin sent a refill of Jardiance to [place] on 9/18 Mr. [place] indicates he prefers [facility] over [facility] due to delivery delays - advised Mr. [place] to contact Dr. Claris Gladden office to request future refills be sent to an alternate pharmacy Discussed the patient woke up with her cheek swollen under her eye, Mr. [place] reports this happened before. Encouraged him to contact patients primary care provider as needed Education provided on the role of the care coordination team - scheduled telephonic appointment with [place] on 10/12 at 12:00         Your next appointment is by telephone on 10/12 at 12:00 with [place]  Please call the care guide team at [phone] if you need to cancel or reschedule your appointment.   If you are experiencing a Mental Health or [place] or need someone to talk to, please call 1-800-273-TALK (toll free, 24 hour hotline)  Patient verbalizes understanding of instructions and care plan provided today and agrees to view in [place]. Active MyChart status and patient understanding of how to access instructions and care plan via MyChart confirmed with patient.     Telephone follow up appointment with care management team member scheduled for:10/12  Daneen Schick, BSW, CDP Social Worker, Certified Dementia Practitioner [place]  Management  Care Coordination [phone]

## 2021-10-20 NOTE — Patient Outreach (Signed)
  Care Coordination   Initial Visit Note   10/20/2021 Name: [name] MRN: [id number] DOB: 1950/08/31  [name] is a 71 y.o. year old female who sees [name], MD for primary care. I  spoke with patients spouse and caregiver [name] by phone.  What matters to the patients health and wellness today?  To find out why her cheek is swollen    Goals Addressed             This Visit's Progress    COMPLETED: Care Coordination Activities       Care Coordination Interventions: SDoH screening completed - no acute resource needs identified Determined the patient does not have concerns with medication costs at this time Discussed spouse is having a hard time getting Jardiance refilled Performed chart review to note Dr. Aundra Dubin sent a refill of Jardiance to [place] on 9/18 Mr. [name] indicates he prefers [facility] over [facility] due to delivery delays - advised Mr. [name] to contact Dr. Claris Gladden office to request future refills be sent to an alternate pharmacy Discussed the patient woke up with her cheek swollen under her eye, Mr. [name] reports this happened before. Encouraged him to contact patients primary care provider as needed Education provided on the role of the care coordination team - scheduled telephonic appointment with [place] on 10/12 at 12:00         SDOH assessments and interventions completed:  Yes  SDOH Interventions Today    Flowsheet Row Most Recent Value  SDOH Interventions   Food Insecurity Interventions Intervention Not Indicated  Housing Interventions Intervention Not Indicated  Transportation Interventions Intervention Not Indicated  Utilities Interventions Intervention Not Indicated        Care Coordination Interventions Activated:  Yes  Care Coordination Interventions:  Yes, provided   Follow up plan: Follow up call scheduled for 10/12 with RN Care Manager    Encounter Outcome:  Pt.  Visit Completed   Daneen Schick, Arita Miss, CDP Social Worker, Certified Dementia Practitioner [place] Management  Care Coordination [phone]

## 2021-10-27 ENCOUNTER — Other Ambulatory Visit: Payer: Self-pay

## 2021-10-27 MED ORDER — ATORVASTATIN CALCIUM 80 MG PO TABS: 80.0000 mg | ORAL_TABLET | Freq: Every day | ORAL | 0 refills | Status: DC

## 2021-10-27 NOTE — Telephone Encounter (Signed)
Refill request received from pharmacy and follow up appointment scheduled

## 2021-10-30 ENCOUNTER — Encounter: Payer: Medicare HMO | Admitting: Cardiology

## 2021-10-30 ENCOUNTER — Ambulatory Visit (INDEPENDENT_AMBULATORY_CARE_PROVIDER_SITE_OTHER): Payer: Medicare HMO

## 2021-10-30 DIAGNOSIS — I442 Atrioventricular block, complete: Secondary | ICD-10-CM | POA: Diagnosis not present

## 2021-10-31 LAB — CUP PACEART REMOTE DEVICE CHECK
Battery Remaining Longevity: 71 mo
Battery Remaining Percentage: 76 %
Battery Voltage: 2.99 V
Brady Statistic AP VP Percent: 37 %
Brady Statistic AP VS Percent: 1 %
Brady Statistic AS VP Percent: 60 %
Brady Statistic AS VS Percent: 1 %
Brady Statistic RA Percent Paced: 33 %
Date Time Interrogation Session: 20231009020011
Implantable Lead Implant Date: 20220411
Implantable Lead Implant Date: 20220411
Implantable Lead Implant Date: 20220411
Implantable Lead Location: 753858
Implantable Lead Location: 753859
Implantable Lead Location: 753860
Implantable Pulse Generator Implant Date: 20220411
Lead Channel Impedance Value: 390 Ohm
Lead Channel Impedance Value: 410 Ohm
Lead Channel Impedance Value: 550 Ohm
Lead Channel Pacing Threshold Amplitude: 0.375 V
Lead Channel Pacing Threshold Amplitude: 0.75 V
Lead Channel Pacing Threshold Amplitude: 0.75 V
Lead Channel Pacing Threshold Pulse Width: 0.5 ms
Lead Channel Pacing Threshold Pulse Width: 0.5 ms
Lead Channel Pacing Threshold Pulse Width: 0.5 ms
Lead Channel Sensing Intrinsic Amplitude: 2.8 mV
Lead Channel Sensing Intrinsic Amplitude: 7.3 mV
Lead Channel Setting Pacing Amplitude: 2 V
Lead Channel Setting Pacing Amplitude: 2 V
Lead Channel Setting Pacing Amplitude: 2 V
Lead Channel Setting Pacing Pulse Width: 0.5 ms
Lead Channel Setting Pacing Pulse Width: 0.5 ms
Lead Channel Setting Sensing Sensitivity: 4 mV
Pulse Gen Model: 3562
Pulse Gen Serial Number: 6262141

## 2021-11-02 ENCOUNTER — Ambulatory Visit: Payer: Self-pay

## 2021-11-02 NOTE — Patient Outreach (Signed)
  Care Coordination   Initial Visit Note   11/02/2021 Name: Rosaelena Kemnitz [place] MRN: [id number] DOB: [birth year]  Eustolia Drennen [place] is a 71 y.o. year old female who sees [name], MD for primary care. I spoke with spouse Mr. [name] [place] Sr by phone today.  What matters to the patients health and wellness today?  Patient has gum disease and tooth decay, requiring a full extraction.     Goals Addressed               This Visit's Progress     Patient Stated     My wife needs some dental care (pt-stated)        Care Coordination Interventions: Evaluation of current treatment plan related to tooth decay and patient's adherence to plan as established by provider Determined patient is need of dental care, husband reports patient has severe gum disease leading to tooth decay Determined husband was advised patient will need full extraction and replaced of teeth with dentures Determined patient is not currently experiencing any discomfort from the decay, however patients cardiologist, Dr. Aundra Dubin advised the decay can lead to infection of the heart Determined patient's insurance will not cover dental care Collaborated with Daneen Schick BSW regarding resources for affordable dental care Provided husband with the contact number and address for A1 dental services         SDOH assessments and interventions completed:  No     Care Coordination Interventions Activated:  Yes  Care Coordination Interventions:  Yes, provided   Follow up plan: Follow up call scheduled for 12/01/21 '@10'$ :30 AM    Encounter Outcome:  Pt. Visit Completed

## 2021-11-02 NOTE — Patient Instructions (Signed)
Visit Information  Thank you for taking time to visit with me today. Please don't hesitate to contact me if I can be of assistance to you.   Following are the goals we discussed today:   Goals Addressed               This Visit's Progress     Patient Stated     My wife needs some dental care (pt-stated)        Care Coordination Interventions: Evaluation of current treatment plan related to tooth decay and patient's adherence to plan as established by provider Determined patient is need of dental care, husband reports patient has severe gum disease leading to tooth decay Determined husband was advised patient will need full extraction and replaced of teeth with dentures Determined patient is not currently experiencing any discomfort from the decay, however patients cardiologist, Dr. Aundra Dubin advised the decay can lead to infection of the heart Determined patient's insurance will not cover dental care Collaborated with Daneen Schick BSW regarding resources for affordable dental care Provided husband with the contact number and address for A1 dental services         Our next appointment is by telephone on 12/01/21 at 10:30 AM  Please call the care guide team at [phone] if you need to cancel or reschedule your appointment.   If you are experiencing a Mental Health or [place] or need someone to talk to, please call 1-800-273-TALK (toll free, 24 hour hotline) go to [facility] [address], [place] [phone])  Patient verbalizes understanding of instructions and care plan provided today and agrees to view in [place]. Active MyChart status and patient understanding of how to access instructions and care plan via MyChart confirmed with patient.     Barb Merino, RN, BSN, CCM Care Management Coordinator [place] Management Direct Phone: [phone]

## 2021-11-15 NOTE — Progress Notes (Signed)
Remote pacemaker transmission.   

## 2021-11-22 ENCOUNTER — Encounter: Payer: Self-pay | Admitting: Cardiology

## 2021-11-22 ENCOUNTER — Ambulatory Visit: Payer: Medicare HMO | Attending: Cardiology | Admitting: Cardiology

## 2021-11-22 VITALS — BP 112/52 | Ht 67.0 in | Wt 128.0 lb

## 2021-11-22 DIAGNOSIS — I442 Atrioventricular block, complete: Secondary | ICD-10-CM | POA: Diagnosis not present

## 2021-11-22 DIAGNOSIS — I5022 Chronic systolic (congestive) heart failure: Secondary | ICD-10-CM

## 2021-11-22 LAB — CUP PACEART INCLINIC DEVICE CHECK
Battery Remaining Longevity: 72 mo
Battery Voltage: 2.99 V
Brady Statistic RA Percent Paced: 33 %
Brady Statistic RV Percent Paced: 97 %
Date Time Interrogation Session: 20231101114253
Implantable Lead Connection Status: 753985
Implantable Lead Connection Status: 753985
Implantable Lead Connection Status: 753985
Implantable Lead Implant Date: 20220411
Implantable Lead Implant Date: 20220411
Implantable Lead Implant Date: 20220411
Implantable Lead Location: 753858
Implantable Lead Location: 753859
Implantable Lead Location: 753860
Implantable Pulse Generator Implant Date: 20220411
Lead Channel Impedance Value: 387.5 Ohm
Lead Channel Impedance Value: 412.5 Ohm
Lead Channel Impedance Value: 562.5 Ohm
Lead Channel Pacing Threshold Amplitude: 0.75 V
Lead Channel Pacing Threshold Amplitude: 0.75 V
Lead Channel Pacing Threshold Amplitude: 0.75 V
Lead Channel Pacing Threshold Amplitude: 0.75 V
Lead Channel Pacing Threshold Amplitude: 0.75 V
Lead Channel Pacing Threshold Amplitude: 0.75 V
Lead Channel Pacing Threshold Amplitude: 0.75 V
Lead Channel Pacing Threshold Amplitude: 1 V
Lead Channel Pacing Threshold Pulse Width: 0.5 ms
Lead Channel Pacing Threshold Pulse Width: 0.5 ms
Lead Channel Pacing Threshold Pulse Width: 0.5 ms
Lead Channel Pacing Threshold Pulse Width: 0.5 ms
Lead Channel Pacing Threshold Pulse Width: 0.5 ms
Lead Channel Pacing Threshold Pulse Width: 0.5 ms
Lead Channel Pacing Threshold Pulse Width: 0.5 ms
Lead Channel Pacing Threshold Pulse Width: 0.5 ms
Lead Channel Sensing Intrinsic Amplitude: 3 mV
Lead Channel Sensing Intrinsic Amplitude: 8 mV
Lead Channel Setting Pacing Amplitude: 2 V
Lead Channel Setting Pacing Amplitude: 2 V
Lead Channel Setting Pacing Amplitude: 2 V
Lead Channel Setting Pacing Pulse Width: 0.5 ms
Lead Channel Setting Pacing Pulse Width: 0.5 ms
Lead Channel Setting Sensing Sensitivity: 4 mV
Pulse Gen Model: 3562
Pulse Gen Serial Number: 6262141

## 2021-11-22 NOTE — Progress Notes (Signed)
,   Electrophysiology Office Note   Date:  11/22/2021   ID:  [name], [name], [name], MRN [id number]  PCP:  [name], MD  Cardiologist:  Oval [name] Primary Electrophysiologist:  Sharica Roedel Meredith Leeds, MD    Chief Complaint: heart block   History of Present Illness: [name] is a 71 y.o. female who is being seen today for the evaluation of heart block at the request of [place], Maricela Bo, [place]. Presenting today for electrophysiology evaluation.  She has a history significant for hypertension, hyperlipidemia, CVA with expressive aphasia, right-sided deficit, sickle cell anemia, seizure disorder, heart failure.  She presented to the hospital April 2022 with seizure-like disorder found to be in complete heart block.  She is now status post Abbott CRT-P implanted 04/29/2020.  Today, denies symptoms of palpitations, chest pain, shortness of breath, orthopnea, PND, lower extremity edema, claudication, dizziness, presyncope, syncope, bleeding, or neurologic sequela. The patient is tolerating medications without difficulties.      Past Medical History:  Diagnosis Date   Adenomatous polyp of colon 09/2012   repeat colonoscopy in 5 years by Dr. Sharlett Iles   CHF (congestive heart failure) ([place])    EF 15-20% as of 05/28/11 (Dr Legrand [facility] summary)   CVA (cerebral infarction) 12/2007   H/O heart bypass surgery 2022   Hemiparesis ([place])    Hyperlipidemia    Hypertension    Seizures ([place])    Sickle cell anemia ([place])    Stroke ([place])    Vitamin D deficiency 2010   Past Surgical History:  Procedure Laterality Date   ABDOMINAL HYSTERECTOMY     BIV PACEMAKER INSERTION CRT-P N/A 04/29/2020   Procedure: BIV PACEMAKER INSERTION CRT-P;  Surgeon: Constance Haw, MD;  Location: [facility];  Service: Cardiovascular;  Laterality: N/A;   CORONARY ARTERY BYPASS GRAFT N/A 04/25/2020   Procedure: CORONARY ARTERY BYPASS GRAFTING (CABG) TIMES TWO USING LEFT  INTERNAL MAMMARY ARTERY AND RIGHT GREATER SAPHENOUS VEIN HARVESTED ENDOSCOPICALLY;  Surgeon: Melrose Nakayama, MD;  Location: [facility];  Service: Open Heart Surgery;  Laterality: N/A;   FRACTURE SURGERY     HIP SURGERY     LEFT HEART CATH AND CORONARY ANGIOGRAPHY N/A 04/22/2020   Procedure: LEFT HEART CATH AND CORONARY ANGIOGRAPHY;  Surgeon: Larey Dresser, MD;  Location: [facility];  Service: Cardiovascular;  Laterality: N/A;   TEE WITHOUT CARDIOVERSION  04/25/2020   Procedure: TRANSESOPHAGEAL ECHOCARDIOGRAM (TEE);  Surgeon: Melrose Nakayama, MD;  Location: [facility];  Service: Open Heart Surgery;;   TEMPORARY PACEMAKER N/A 04/21/2020   Procedure: TEMPORARY PACEMAKER;  Surgeon: Larey Dresser, MD;  Location: [facility];  Service: Cardiovascular;  Laterality: N/A;   TUBAL LIGATION       Current Outpatient Medications  Medication Sig Dispense Refill   acetaminophen (TYLENOL) 325 MG tablet Take 650 mg by mouth 2 (two) times a week. As needed     aspirin 81 MG chewable tablet Chew 1 tablet (81 mg total) by mouth daily. 30 tablet 6   atorvastatin (LIPITOR) 80 MG tablet Take 1 tablet (80 mg total) by mouth at bedtime. 30 tablet 0   Cholecalciferol 50 MCG (2000 UT) TABS Take 1 tablet by mouth daily in the afternoon.     empagliflozin (JARDIANCE) 10 MG TABS tablet Take 1 tablet (10 mg total) by mouth daily. 30 tablet 11   furosemide (LASIX) 20 MG tablet TAKE 1 TABLET(20 MG) BY MOUTH EVERY OTHER DAY 90  tablet 3   Lacosamide (VIMPAT) 100 MG TABS Take 1 tablet (100 mg total) by mouth in the morning and at bedtime. 60 tablet 5   levETIRAcetam (KEPPRA) 750 MG tablet Take 2 tablets (1,500 mg total) by mouth 2 (two) times daily. 360 tablet 3   metoprolol succinate (TOPROL-XL) 50 MG 24 hr tablet Take 1 tablet (50 mg total) by mouth daily. Take with or immediately following a meal. 90 tablet 3   Nutritional Supplements (NUTRITIONAL SUPPLEMENT PO) Take 120 mLs by mouth once. Medpass for  weight gain.     sacubitril-valsartan (ENTRESTO) 97-103 MG Take 1 tablet by mouth 2 (two) times daily. 60 tablet 11   spironolactone (ALDACTONE) 25 MG tablet TAKE 1 TABLET(25 MG) BY MOUTH DAILY 45 tablet 3   warfarin (COUMADIN) 5 MG tablet Take 5 mg by mouth daily.     No current facility-administered medications for this visit.    Allergies:   Lexiscan [regadenoson]   Social History:  The patient  reports that she quit smoking about 14 years ago. Her smoking use included cigarettes. She has never used smokeless tobacco. She reports that she does not currently use alcohol. She reports that she does not use drugs.   Family History:  The patient's family history includes Asthma in her brother; Bronchitis in her brother; Colon cancer in her maternal grandfather; Diabetes in her daughter; Heart disease in her daughter; High Cholesterol in her daughter and sister; Hypertension in her daughter and sister.   ROS:  Please see the history of present illness.   Otherwise, review of systems is positive for none.   All other systems are reviewed and negative.   PHYSICAL EXAM: VS:  BP (!) 112/52   Ht '5\' 7"'$  (1.702 m)   Wt 128 lb (58.1 kg)   BMI 20.05 kg/m  , BMI Body mass index is 20.05 kg/m. GEN: Well nourished, well developed, in no acute distress  HEENT: normal  Neck: no JVD, carotid bruits, or masses Cardiac: RRR; no murmurs, rubs, or gallops,no edema  Respiratory:  clear to auscultation bilaterally, normal work of breathing GI: soft, nontender, nondistended, + BS MS: no deformity or atrophy  Skin: warm and dry, device site well healed Neuro:  Strength and sensation are intact Psych: euthymic mood, full affect  EKG:  EKG is ordered today. Personal review of the ekg ordered shows sinus rhythm, ventricular paced  Personal review of the device interrogation today. Results in [place]: 02/28/2021: ALT 8 09/12/2021: BUN 19; Creatinine, Ser 1.14; Hemoglobin 12.3; Platelets 259;  Potassium 4.4; Sodium 140    Lipid Panel     Component Value Date/Time   CHOL 126 06/12/2021 0935   CHOL 120 10/08/2019 1031   TRIG 63 06/12/2021 0935   HDL 46 06/12/2021 0935   HDL 39 (L) 10/08/2019 1031   CHOLHDL 2.7 06/12/2021 0935   VLDL 13 06/12/2021 0935   LDLCALC 67 06/12/2021 0935   LDLCALC 61 10/08/2019 1031     Wt Readings from Last 3 Encounters:  11/22/21 128 lb (58.1 kg)  09/12/21 121 lb 12.8 oz (55.2 kg)  09/07/21 125 lb (56.7 kg)      Other studies Reviewed: Additional studies/ records that were reviewed today include: TTE 04/29/20  Review of the above records today demonstrates:   1. Left ventricular ejection fraction, by estimation, is 30 to 35%. The  left ventricle has moderately decreased function. The left ventricle  demonstrates regional wall motion abnormalities (see scoring  diagram/findings for description). Inferior/lateral   akinesis. Left ventricular diastolic parameters are indeterminate.   2. Right ventricular systolic function is severely reduced. The right  ventricular size is normal. There is normal pulmonary artery systolic  pressure.   3. The mitral valve is normal in structure. Trivial mitral valve  regurgitation.   4. The aortic valve is tricuspid. Aortic valve regurgitation is moderate.  Mild aortic valve sclerosis is present, with no evidence of aortic valve  stenosis.    ASSESSMENT AND PLAN:  1.  Complete heart block: Status post [facility] Jude CRT-P.  Device function appropriate.  No changes at this time.  2.  Chronic systolic heart failure: Due to ischemic cardiomyopathy.  Currently on Entresto, Lasix, Aldactone, Toprol-XL.  Plan per heart failure cardiology.  3.  Coronary artery disease: Status post CABG. current chest pain.  Continue management per primary cardiology.   Current medicines are reviewed at length with the patient today.   The patient does not have concerns regarding her medicines.  The following changes were made  today: None  Labs/ tests ordered today include:  Orders Placed This Encounter  Procedures   CUP [place]   EKG 12-Lead     Disposition:   FU 12 months  Signed, Jacorey Donaway Meredith Leeds, MD  11/22/2021 11:48 AM     [place] [address] [phone] (office) [phone] (fax)

## 2021-11-29 ENCOUNTER — Encounter: Payer: Self-pay | Admitting: Family Medicine

## 2021-11-29 ENCOUNTER — Ambulatory Visit (INDEPENDENT_AMBULATORY_CARE_PROVIDER_SITE_OTHER): Payer: Medicare HMO | Admitting: Family Medicine

## 2021-11-29 VITALS — BP 110/70 | HR 74 | Temp 96.9°F | Ht 67.0 in | Wt 125.6 lb

## 2021-11-29 DIAGNOSIS — I10 Essential (primary) hypertension: Secondary | ICD-10-CM | POA: Diagnosis not present

## 2021-11-29 DIAGNOSIS — Z1231 Encounter for screening mammogram for malignant neoplasm of breast: Secondary | ICD-10-CM

## 2021-11-29 DIAGNOSIS — Z1211 Encounter for screening for malignant neoplasm of colon: Secondary | ICD-10-CM | POA: Diagnosis not present

## 2021-11-29 DIAGNOSIS — I69322 Dysarthria following cerebral infarction: Secondary | ICD-10-CM

## 2021-11-29 DIAGNOSIS — I442 Atrioventricular block, complete: Secondary | ICD-10-CM | POA: Diagnosis not present

## 2021-11-29 DIAGNOSIS — E782 Mixed hyperlipidemia: Secondary | ICD-10-CM | POA: Diagnosis not present

## 2021-11-29 DIAGNOSIS — Z13820 Encounter for screening for osteoporosis: Secondary | ICD-10-CM | POA: Diagnosis not present

## 2021-11-29 DIAGNOSIS — G40909 Epilepsy, unspecified, not intractable, without status epilepticus: Secondary | ICD-10-CM

## 2021-11-29 DIAGNOSIS — Z135 Encounter for screening for eye and ear disorders: Secondary | ICD-10-CM

## 2021-11-29 DIAGNOSIS — Z7901 Long term (current) use of anticoagulants: Secondary | ICD-10-CM | POA: Diagnosis not present

## 2021-11-29 DIAGNOSIS — Z23 Encounter for immunization: Secondary | ICD-10-CM | POA: Diagnosis not present

## 2021-11-29 DIAGNOSIS — Z8673 Personal history of transient ischemic attack (TIA), and cerebral infarction without residual deficits: Secondary | ICD-10-CM

## 2021-11-29 MED ORDER — APIXABAN 5 MG PO TABS: 5.0000 mg | ORAL_TABLET | Freq: Two times a day (BID) | ORAL | 2 refills | Status: DC

## 2021-11-29 MED ORDER — ATORVASTATIN CALCIUM 80 MG PO TABS: 80.0000 mg | ORAL_TABLET | Freq: Every day | ORAL | 0 refills | Status: DC

## 2021-11-29 NOTE — Progress Notes (Signed)
[place]

## 2021-11-29 NOTE — Progress Notes (Signed)
Provider:  Alain Honey, MD  Careteam: Patient Care Team: [name], MD as PCP - General (Family Medicine) Constance Haw, MD as PCP - Electrophysiology (Cardiology) Jerline Pain, MD as PCP - Cardiology (Cardiology) Adrian Prows, MD as Attending Physician (Cardiology) Burnard Bunting, MD (Internal Medicine) Skeet Latch, MD as Attending Physician (Cardiology) Leta Baptist, Earlean Polka, MD as Consulting Physician (Neurology) Rex Kras, Claudette Stapler, RN as [place] Management  PLACE OF SERVICE:  [place]  Advanced Directive information    Allergies  Allergen Reactions   Lexiscan [Regadenoson] Other (See Comments)    Perioral tingling and throat tightness    Chief Complaint  Patient presents with   Medical Management of Chronic Issues    Patient presents today for 1 year follow-up   Quality Metric Gaps    AWV, colonoscopy, mammogram, zoster, COVID#4, flu     HPI: Patient is a 71 y.o. female.  Patient is here to follow-up chronic problems including late effects of CVA, atherosclerotic heart disease, and seizure disorder.  She is aphasic and her speech is not really intelligible except to her husband who is her main caretaker.  She has no new problems or complaints.  He questions need for labs but she had lipids, A1c renal function all assessed just 5 months ago.  Also requests referrals to ophthalmology to screen for glaucoma and macular degeneration as well as mammogram and bone density referrals. She denies any pain in her back hips or knees.  Appetite is good.  Weight is stable.  Sleeps okay.  Still wears a splint on her right wrist to prevent contracture. Is followed by cardiology related to her AV block.  She has pacemaker.  Review of Systems:  Review of Systems  Constitutional: Negative.   HENT: Negative.    Eyes: Negative.   Respiratory: Negative.    Cardiovascular: Negative.   Gastrointestinal: Negative.   Genitourinary: Negative.    Musculoskeletal: Negative.   Skin: Negative.   Neurological:  Positive for focal weakness.  Psychiatric/Behavioral: Negative.    All other systems reviewed and are negative.   Past Medical History:  Diagnosis Date   Adenomatous polyp of colon 09/2012   repeat colonoscopy in 5 years by Dr. Sharlett Iles   CHF (congestive heart failure) ([place])    EF 15-20% as of 05/28/11 (Dr Legrand [facility] D[facility] summary)   CVA (cerebral infarction) 12/2007   H/O heart bypass surgery 2022   Hemiparesis ([place])    Hyperlipidemia    Hypertension    Seizures ([place])    Sickle cell anemia ([place])    Stroke ([place])    Vitamin D deficiency 2010   Past Surgical History:  Procedure Laterality Date   ABDOMINAL HYSTERECTOMY     BIV PACEMAKER INSERTION CRT-P N/A 04/29/2020   Procedure: BIV PACEMAKER INSERTION CRT-P;  Surgeon: Constance Haw, MD;  Location: [facility];  Service: Cardiovascular;  Laterality: N/A;   CORONARY ARTERY BYPASS GRAFT N/A 04/25/2020   Procedure: CORONARY ARTERY BYPASS GRAFTING (CABG) TIMES TWO USING LEFT INTERNAL MAMMARY ARTERY AND RIGHT GREATER SAPHENOUS VEIN HARVESTED ENDOSCOPICALLY;  Surgeon: Melrose Nakayama, MD;  Location: [facility];  Service: Open Heart Surgery;  Laterality: N/A;   FRACTURE SURGERY     HIP SURGERY     LEFT HEART CATH AND CORONARY ANGIOGRAPHY N/A 04/22/2020   Procedure: LEFT HEART CATH AND CORONARY ANGIOGRAPHY;  Surgeon: Larey Dresser, MD;  Location: [facility];  Service: Cardiovascular;  Laterality: N/A;  TEE WITHOUT CARDIOVERSION  04/25/2020   Procedure: TRANSESOPHAGEAL ECHOCARDIOGRAM (TEE);  Surgeon: Melrose Nakayama, MD;  [facility];  Service: Open Heart Surgery;;   TEMPORARY PACEMAKER N/A 04/21/2020   Procedure: TEMPORARY PACEMAKER;  Surgeon: Larey Dresser, MD;  Location: [facility];  Service: Cardiovascular;  Laterality: N/A;   TUBAL LIGATION     Social History:   reports that she quit smoking about 14 years ago. Her  smoking use included cigarettes. She has never used smokeless tobacco. She reports that she does not currently use alcohol. She reports that she does not use drugs.  Family History  Problem Relation Age of Onset   Diabetes Daughter    High Cholesterol Daughter    Hypertension Daughter    Heart disease Daughter    Hypertension Sister    High Cholesterol Sister    Asthma Brother    Bronchitis Brother    Colon cancer Maternal Grandfather     Medications: Patient's Medications  New Prescriptions   APIXABAN (ELIQUIS) 5 MG TABS TABLET    Take 1 tablet (5 mg total) by mouth 2 (two) times daily.  Previous Medications   ACETAMINOPHEN (TYLENOL) 325 MG TABLET    Take 650 mg by mouth 2 (two) times a week. As needed   ASPIRIN 81 MG CHEWABLE TABLET    Chew 1 tablet (81 mg total) by mouth daily.   CHOLECALCIFEROL 50 MCG (2000 UT) TABS    Take 1 tablet by mouth daily in the afternoon.   EMPAGLIFLOZIN (JARDIANCE) 10 MG TABS TABLET    Take 1 tablet (10 mg total) by mouth daily.   FUROSEMIDE (LASIX) 20 MG TABLET    TAKE 1 TABLET(20 MG) BY MOUTH EVERY OTHER DAY   LACOSAMIDE (VIMPAT) 100 MG TABS    Take 1 tablet (100 mg total) by mouth in the morning and at bedtime.   LEVETIRACETAM (KEPPRA) 750 MG TABLET    Take 2 tablets (1,500 mg total) by mouth 2 (two) times daily.   METOPROLOL SUCCINATE (TOPROL-XL) 50 MG 24 HR TABLET    Take 1 tablet (50 mg total) by mouth daily. Take with or immediately following a meal.   NUTRITIONAL SUPPLEMENTS (NUTRITIONAL SUPPLEMENT PO)    Take 120 mLs by mouth once. Medpass for weight gain.   SACUBITRIL-VALSARTAN (ENTRESTO) 97-103 MG    Take 1 tablet by mouth 2 (two) times daily.   SPIRONOLACTONE (ALDACTONE) 25 MG TABLET    TAKE 1 TABLET(25 MG) BY MOUTH DAILY   WARFARIN (COUMADIN) 5 MG TABLET    Take 5 mg by mouth daily.  Modified Medications   Modified Medication Previous Medication   ATORVASTATIN (LIPITOR) 80 MG TABLET atorvastatin (LIPITOR) 80 MG tablet      Take 1 tablet  (80 mg total) by mouth at bedtime.    Take 1 tablet (80 mg total) by mouth at bedtime.  Discontinued Medications   No medications on file    Physical Exam:  Vitals:   11/29/21 1006  BP: 110/70  Pulse: 74  Temp: (!) 96.9 F (36.1 C)  SpO2: 98%  Weight: 125 lb 9.6 oz (57 kg)  Height: '5\' 7"'$  (1.702 m)   Body mass index is 19.67 kg/m. Wt Readings from Last 3 Encounters:  11/29/21 125 lb 9.6 oz (57 kg)  11/22/21 128 lb (58.1 kg)  09/12/21 121 lb 12.8 oz (55.2 kg)    Physical Exam Vitals and nursing note reviewed.  Constitutional:      Appearance: Normal  appearance.     Comments: Patient in wheelchair.  Right hemiparesis  HENT:     Head: Normocephalic.  Cardiovascular:     Rate and Rhythm: Normal rate and regular rhythm.     Heart sounds: Normal heart sounds.  Pulmonary:     Effort: Pulmonary effort is normal.     Breath sounds: Normal breath sounds.  Abdominal:     General: Bowel sounds are normal.     Palpations: Abdomen is soft.  Musculoskeletal:     Comments: Splint on right wrist as noted above.  No other contractures  Skin:    General: Skin is warm and dry.  Neurological:     General: No focal deficit present.     Mental Status: She is alert and oriented to person, place, and time.     Motor: Weakness present.     Gait: Gait abnormal.  Psychiatric:        Mood and Affect: Mood normal.        Behavior: Behavior normal.     Labs reviewed: Basic Metabolic Panel: Recent Labs    03/24/21 0926 06/12/21 0935 09/12/21 1039  NA 138 140 140  K 4.5 4.4 4.4  CL 106 110 108  CO2 '24 23 22  '$ GLUCOSE 87 91 88  BUN '19 23 19  '$ CREATININE 0.96 1.19* 1.14*  CALCIUM 9.1 9.1 9.2   Liver Function Tests: Recent Labs    02/28/21 1038  AST 21  ALT 8  ALKPHOS 123*  BILITOT 0.3  PROT 7.5  ALBUMIN 4.6   No results for input(s): "LIPASE", "AMYLASE" in the last 8760 hours. No results for input(s): "AMMONIA" in the last 8760 hours. CBC: Recent Labs     02/28/21 1038 06/12/21 0935 09/12/21 1039  WBC 5.6 5.1 5.3  HGB 11.9 11.9* 12.3  HCT 37.2 38.1 38.1  MCV 87 90.7 89.6  PLT 257 224 259   Lipid Panel: Recent Labs    06/12/21 0935  CHOL 126  HDL 46  LDLCALC 67  TRIG 63  CHOLHDL 2.7   TSH: No results for input(s): "TSH" in the last 8760 hours. A1C: Lab Results  Component Value Date   HGBA1C 6.1 (H) 04/22/2020     Assessment/Plan  1. Need for influenza vaccination Flu shot given  2. Encounter for screening mammogram for malignant neoplasm of breast Request for mammogram sent  3. Screening for colon cancer Request for colonoscopy sent  4. Primary hypertension Blood pressure well controlled at 110/70 on combination Entresto metoprolol  5. Mixed hyperlipidemia Lipids were assessed in May and LDL is good at 67  6. Screening for osteoporosis Patient takes vitamin D but not calcium.  Since she is nonweightbearing would be concerned about possibility of osteoporosis  7. Screening for glaucoma No symptoms  8. Anticoagulated on Coumadin Patient is on Coumadin but has not been having her PT/INR checked.  Raise possibility of rapid acting agent.  If that is affordable we will discontinue Coumadin and use Eliquis  9. Complete heart block ([place]) Followed by cardiologist.  Has pacemaker  10. Dysarthria as late effect of cerebrovascular accident (CVA) Speech largely unintelligible but husband can understand and she certainly can understand spoken word  64. History of CVA (cerebrovascular accident) No further sequelae and no improvement  12. Seizure disorder ([place]) No seizures.  Followed by neurology   Alain Honey, MD [place] [phone]

## 2021-11-30 ENCOUNTER — Other Ambulatory Visit: Payer: Self-pay

## 2021-11-30 MED ORDER — ATORVASTATIN CALCIUM 80 MG PO TABS: 80.0000 mg | ORAL_TABLET | Freq: Every day | ORAL | 0 refills | Status: DC

## 2021-12-01 ENCOUNTER — Ambulatory Visit: Payer: Self-pay

## 2021-12-01 NOTE — Patient Outreach (Signed)
  Care Coordination   12/01/2021 Name: Tanice Petre [place] MRN: [id number] DOB: 02-20-1950   Care Coordination Outreach Attempts:  An unsuccessful telephone outreach was attempted for a scheduled appointment today.  Follow Up Plan:  Additional outreach attempts will be made to offer the patient care coordination information and services.   Encounter Outcome:  No Answer  Care Coordination Interventions Activated:  No   Care Coordination Interventions:  No, not indicated    Barb Merino, RN, BSN, CCM Care Management Coordinator [facility] Care Management  Direct Phone: [phone]

## 2021-12-04 ENCOUNTER — Other Ambulatory Visit: Payer: Self-pay

## 2021-12-04 ENCOUNTER — Encounter: Payer: Self-pay | Admitting: Family Medicine

## 2021-12-04 MED ORDER — ATORVASTATIN CALCIUM 80 MG PO TABS: 80.0000 mg | ORAL_TABLET | Freq: Every day | ORAL | 1 refills | Status: DC

## 2021-12-06 ENCOUNTER — Telehealth (HOSPITAL_COMMUNITY): Payer: Self-pay

## 2021-12-06 ENCOUNTER — Other Ambulatory Visit (HOSPITAL_COMMUNITY): Payer: Self-pay

## 2021-12-06 ENCOUNTER — Other Ambulatory Visit (HOSPITAL_COMMUNITY): Payer: Self-pay | Admitting: *Deleted

## 2021-12-06 MED ORDER — EMPAGLIFLOZIN 10 MG PO TABS: 10.0000 mg | ORAL_TABLET | Freq: Every day | ORAL | 11 refills | Status: DC

## 2021-12-06 MED ORDER — ENTRESTO 97-103 MG PO TABS: 1.0000 | ORAL_TABLET | Freq: Two times a day (BID) | ORAL | 11 refills | Status: DC

## 2021-12-06 NOTE — Telephone Encounter (Signed)
Advanced Heart Failure Patient Advocate Encounter  The patient was approved for a [employer] grant that will help cover the cost of Entresto, Jardiance.  Total amount awarded, $10,000.  Effective: 11/06/2021 - 11/06/2022.  BIN [id number] PCN [id number] Group [id number] ID [id number]  New prescription(s) sent to [facility] on [address]. Patient provided with approval and processing information via mail.  Clista Bernhardt, CPhT Rx Patient Advocate Phone: [phone]

## 2021-12-06 NOTE — Telephone Encounter (Signed)
Advanced Heart Failure Patient Advocate Encounter   Received renewal notification for [employer]). This patient is also currently taking Jardiance [employer]) and is eligible for a grant that is currently open and would cover the cost of both medications.   No answer, voice mail box is not set up.   Clista Bernhardt, CPhT Rx Patient Advocate Phone: [phone]

## 2021-12-11 ENCOUNTER — Ambulatory Visit
Admission: RE | Admit: 2021-12-11 | Discharge: 2021-12-11 | Disposition: A | Discharge status: Home / Self Care | Payer: Medicare HMO | Source: Ambulatory Visit | Attending: Family Medicine | Admitting: Family Medicine

## 2021-12-11 DIAGNOSIS — Z1231 Encounter for screening mammogram for malignant neoplasm of breast: Secondary | ICD-10-CM | POA: Diagnosis not present

## 2021-12-13 ENCOUNTER — Other Ambulatory Visit (HOSPITAL_COMMUNITY): Payer: Self-pay

## 2021-12-13 MED ORDER — SPIRONOLACTONE 25 MG PO TABS: ORAL_TABLET | ORAL | 11 refills | Status: DC

## 2021-12-28 ENCOUNTER — Encounter (HOSPITAL_COMMUNITY): Payer: Self-pay | Admitting: Cardiology

## 2021-12-28 ENCOUNTER — Ambulatory Visit (HOSPITAL_COMMUNITY)
Admission: RE | Admit: 2021-12-28 | Discharge: 2021-12-28 | Disposition: A | Discharge status: Home / Self Care | Payer: Medicare HMO | Source: Ambulatory Visit | Attending: Cardiology | Admitting: Cardiology

## 2021-12-28 VITALS — BP 100/60 | HR 60

## 2021-12-28 DIAGNOSIS — Z8673 Personal history of transient ischemic attack (TIA), and cerebral infarction without residual deficits: Secondary | ICD-10-CM | POA: Diagnosis not present

## 2021-12-28 DIAGNOSIS — Z7901 Long term (current) use of anticoagulants: Secondary | ICD-10-CM | POA: Insufficient documentation

## 2021-12-28 DIAGNOSIS — I2510 Atherosclerotic heart disease of native coronary artery without angina pectoris: Secondary | ICD-10-CM | POA: Diagnosis not present

## 2021-12-28 DIAGNOSIS — Z951 Presence of aortocoronary bypass graft: Secondary | ICD-10-CM | POA: Diagnosis not present

## 2021-12-28 DIAGNOSIS — I442 Atrioventricular block, complete: Secondary | ICD-10-CM | POA: Insufficient documentation

## 2021-12-28 DIAGNOSIS — G40909 Epilepsy, unspecified, not intractable, without status epilepticus: Secondary | ICD-10-CM | POA: Insufficient documentation

## 2021-12-28 DIAGNOSIS — Z79899 Other long term (current) drug therapy: Secondary | ICD-10-CM | POA: Diagnosis not present

## 2021-12-28 DIAGNOSIS — I255 Ischemic cardiomyopathy: Secondary | ICD-10-CM | POA: Insufficient documentation

## 2021-12-28 DIAGNOSIS — I5022 Chronic systolic (congestive) heart failure: Secondary | ICD-10-CM

## 2021-12-28 LAB — BASIC METABOLIC PANEL
Anion gap: 8 (ref 5–15)
BUN: 22 mg/dL (ref 8–23)
CO2: 23 mmol/L (ref 22–32)
Calcium: 9.2 mg/dL (ref 8.9–10.3)
Chloride: 105 mmol/L (ref 98–111)
Creatinine, Ser: 1.11 mg/dL — ABNORMAL HIGH (ref 0.44–1.00)
GFR, Estimated: 53 mL/min — ABNORMAL LOW (ref >60)
Glucose, Bld: 79 mg/dL (ref 70–99)
Potassium: 4.5 mmol/L (ref 3.5–5.1)
Sodium: 136 mmol/L (ref 135–145)

## 2021-12-28 LAB — CBC
HCT: 41 % (ref 36.0–46.0)
Hemoglobin: 13.2 g/dL (ref 12.0–15.0)
MCH: 28.9 pg (ref 26.0–34.0)
MCHC: 32.2 g/dL (ref 30.0–36.0)
MCV: 89.7 fL (ref 80.0–100.0)
Platelets: 244 10*3/uL (ref 150–400)
RBC: 4.57 MIL/uL (ref 3.87–5.11)
RDW: 13.2 % (ref 11.5–15.5)
WBC: 5 10*3/uL (ref 4.0–10.5)
nRBC: 0 % (ref 0.0–0.2)

## 2021-12-28 LAB — BRAIN NATRIURETIC PEPTIDE: B Natriuretic Peptide: 613.1 pg/mL — ABNORMAL HIGH (ref 0.0–100.0)

## 2021-12-28 NOTE — Patient Instructions (Signed)
STOP Asprin  Labs done today, your results will be available in MyChart, we will contact you for abnormal readings.  Your physician has requested that you have an echocardiogram. Echocardiography is a painless test that uses sound waves to create images of your heart. It provides your doctor with information about the size and shape of your heart and how well your heart's chambers and valves are working. This procedure takes approximately one hour. There are no restrictions for this procedure. Please do NOT wear cologne, perfume, aftershave, or lotions (deodorant is allowed). Please arrive 15 minutes prior to your appointment time.  Your physician recommends that you schedule a follow-up appointment in: 4 months ( April 2024)  ** please call the office in February to arrange your follow up appointment **  If you have any questions or concerns before your next appointment please send [place] a message through [place] or call our office at [phone].    TO LEAVE A MESSAGE FOR THE NURSE SELECT OPTION 2, PLEASE LEAVE A MESSAGE INCLUDING: YOUR NAME DATE OF BIRTH CALL BACK NUMBER REASON FOR CALL**this is important as we prioritize the call backs  YOU WILL RECEIVE A CALL BACK THE SAME DAY AS LONG AS YOU CALL BEFORE 4:00 PM  At the [facility], you and your health needs are our priority. As part of our continuing mission to provide you with exceptional heart care, we have created designated Provider Care Teams. These Care Teams include your primary Cardiologist (physician) and Advanced Practice Providers (APPs- Physician Assistants and Nurse Practitioners) who all work together to provide you with the care you need, when you need it.   You may see any of the following providers on your designated Care Team at your next follow up: Dr Glori Bickers Dr Loralie Champagne Dr. Roxana Hires, NP Lyda Jester, [place] [facility] [place], [place] Forestine Na,  NP Audry Riles, PharmD   Please be sure to bring in all your medications bottles to every appointment.

## 2021-12-29 NOTE — Progress Notes (Signed)
PCP: [name], MD HF Cardiology: Dr. Aundra Dubin  71 y.o. with history of CVA, chronic systolic CHF, CHB, and CAD returns for followup of CHF.   She had a stroke in 2009, thought to be cardioembolic.  She has had right hemiparesis and aphasia since that time.   [name] was hospitalized in August 2020 with a seizure after being out of her medications for 4 days.  She required intubation.  High-sensitivity troponin trended to 1905. An echocardiogram 08/2018 revealed EF of 35 to 40% with global hypokinesis and moderate LVH, elevated LVEDP.  She also had moderate AR. Nuclear stress test in August 2020 showed a large severe fixed inferior, anterior apical, and inferolateral perfusion defect suggestive of scar without ischemia.  Study was high risk due to EF estimated at 34% but mentions no significant reversible ischemia. She was deemed not a good candidate for cardiac catheterization during that hospitalization.    Patient was admitted in 3/22 following a syncopal episode and was found to be in complete heart block.  This improved initially after stopping nodal blockers.  Echo in 3/22 showed EF 35-40%.  LHC showed significant left main and LCx disease.  She was deemed to be a poor candidate for PCI.  In 4/22, she had CABG with LIMA-LAD and SVG-OM2.  Post-op, she developed CHB again.  St Jude CRT-P device was placed. She was discharged to a rehab facility.   Echo in 9/22 showed EF 35% with basal to mid inferior and inferolateral AK, mildly decreased RV function, mild-moderate MR.    Today she returns for HF follow up with her husband. He helps with history due to her aphasia. She walks around her house with walker or cane.  No significant exertional dyspnea or fatigue.  No chest pain.  No palpitations.   Device interrogation (personally reviewed): 95% BiV pacing, no AF, stable thoracic impedance.   Labs (4/22): K 3.9, creatinine 0.83, hgb 9.4 Labs (7/22): K 4.2, creatinine 0.93 Labs (9/22): LDL  67 Labs (2/23): K 4.5, creatinine 1.12 Labs (3/23): K 4.5, creatinine 0.96 Labs (5/23): K 4.4, creatinine 1.19, LDL 67, HDL 46 Labs (8/23): K 4.4, creatinine 1.4  ECG (personally reviewed): a-BiV paced  PMH: 1. H/o seizure disorder.  2. CVA: 2009 with aphasia and right hemiparesis.  3. Complete heart block: St Jude CRT-P. 4. Chronic systolic CHF: Ischemic cardiomyopathy. St Jude CRT-P device.  - Echo (3/22): EF 35-40%, inferior and lateral HK, normal RV.  - Coronary angiography (4/22): 60-70% dLM, 60-70% ostial LCx, 99% proximal calcified LCx (LCx was large, dominant vessel).  - Echo (9/22): EF 35% with basal to mid inferior and inferolateral AK, mildly decreased RV function, mild-moderate MR. 5. CAD:  Coronary angiography (4/22) with 60-70% dLM, 60-70% ostial LCx, 99% proximal calcified LCx (LCx was large, dominant vessel). - CABG x 4 in 4/22: LIMA-LAD, SVG-OM2.  6. CHB: St Jude CRT-P placed in 4/22.   Social History   Socioeconomic History   Marital status: Married    Spouse name: [name]   Number of children: 2   Years of education: 12   Highest education level: Not on file  Occupational History    Employer: RETIRED  Tobacco Use   Smoking status: Former    Types: Cigarettes    Quit date: 09/26/2007    Years since quitting: 14.2   Smokeless tobacco: Never  Vaping Use   Vaping Use: Never used  Substance and Sexual Activity   Alcohol use: Not Currently  Comment: Former drinker, 15 years ago   Drug use: Never   Sexual activity: Not Currently  Other Topics Concern   Not on file  Social History Narrative   Tobacco use, amount per day now: None.   Past tobacco use, amount per day: Everyday.   How many years did you use tobacco: 30 years.   Alcohol use (drinks per week): Wine Daily   Diet:   Do you drink/eat things with caffeine: Yes   Marital status: Married                                  What year were you married?    Do you live in a house, apartment, assisted  living, condo, trailer, etc.? House   Is it one or more stories? 1 story.   How many persons live in your home? 2   Do you have pets in your home?( please list) No   Highest Level of education completed? Associate   Current or past profession: Director    Do you exercise?    No                             Type and how often?   Do you have a living will?    Do you have a DNR form?                                   If not, do you want to discuss one?   Do you have signed POA/HPOA forms?                        If so, please bring to you appointment      Do you have any difficulty bathing or dressing yourself? Yes   Do you have any difficulty preparing food or eating? Yes   Do you have any difficulty managing your medications? Yes   Do you have any difficulty managing your finances? Yes   Do you have any difficulty affording your medications? Yes   Social Determinants of Health   Financial Resource Strain: Not on file  Food Insecurity: No Food Insecurity (10/20/2021)   Hunger Vital Sign    Worried About Running Out of Food in the Last Year: Never true    Ran Out of Food in the Last Year: Never true  Transportation Needs: No Transportation Needs (10/20/2021)   PRAPARE - Hydrologist (Medical): No    Lack of Transportation (Non-Medical): No  Physical Activity: Not on file  Stress: Not on file  Social Connections: Not on file  Intimate Partner Violence: Not on file   Family History  Problem Relation Age of Onset   Diabetes Daughter    High Cholesterol Daughter    Hypertension Daughter    Heart disease Daughter    Hypertension Sister    High Cholesterol Sister    Asthma Brother    Bronchitis Brother    Colon cancer Maternal Grandfather    ROS: All systems reviewed and negative except as per HPI.   Current Outpatient Medications  Medication Sig Dispense Refill   acetaminophen (TYLENOL) 325 MG tablet Take 650 mg by mouth 2 (two) times a week. As needed      atorvastatin (LIPITOR)  80 MG tablet Take 1 tablet (80 mg total) by mouth at bedtime. 90 tablet 1   Cholecalciferol 50 MCG (2000 UT) TABS Take 1 tablet by mouth daily in the afternoon.     empagliflozin (JARDIANCE) 10 MG TABS tablet Take 1 tablet (10 mg total) by mouth daily. 30 tablet 11   furosemide (LASIX) 20 MG tablet TAKE 1 TABLET(20 MG) BY MOUTH EVERY OTHER DAY 90 tablet 3   Lacosamide (VIMPAT) 100 MG TABS Take 1 tablet (100 mg total) by mouth in the morning and at bedtime. 60 tablet 5   levETIRAcetam (KEPPRA) 750 MG tablet Take 2 tablets (1,500 mg total) by mouth 2 (two) times daily. 360 tablet 3   metoprolol succinate (TOPROL-XL) 50 MG 24 hr tablet Take 1 tablet (50 mg total) by mouth daily. Take with or immediately following a meal. 90 tablet 3   Nutritional Supplements (NUTRITIONAL SUPPLEMENT PO) Take 120 mLs by mouth once. Medpass for weight gain.     sacubitril-valsartan (ENTRESTO) 97-103 MG Take 1 tablet by mouth 2 (two) times daily. 60 tablet 11   spironolactone (ALDACTONE) 25 MG tablet TAKE 1 TABLET(25 MG) BY MOUTH DAILY 30 tablet 11   warfarin (COUMADIN) 5 MG tablet Take 5 mg by mouth daily.     No current facility-administered medications for this encounter.   Wt Readings from Last 3 Encounters:  11/29/21 57 kg (125 lb 9.6 oz)  11/22/21 58.1 kg (128 lb)  09/12/21 55.2 kg (121 lb 12.8 oz)   BP 100/60   Pulse 60   SpO2 100%  General: NAD Neck: No JVD, no thyromegaly or thyroid nodule.  Lungs: Clear to auscultation bilaterally with normal respiratory effort. CV: Nondisplaced PMI.  Heart regular S1/S2, no S3/S4, no murmur.  No peripheral edema.  No carotid bruit.  Unable to palpate pedal pulses.  Abdomen: Soft, nontender, no hepatosplenomegaly, no distention.  Skin: Intact without lesions or rashes.  Neurologic: Aphasia + r-sided weakness.  Psych: Normal affect. Extremities: No clubbing or cyanosis.  HEENT: Normal.   Asssessment/Plan: 1. Complete heart block:  Patient had baseline NSR with LAFB/RBBB, so there was underlying conduction system disease.  She was transiently in CHB pre-CABG and developed persistent CHB post-CABG.  She now has [employer] CRT-P device.   - She needs follow up with EP.  2. Chronic systolic CHF: Ischemic cardiomyopathy.  Echo in 8/20 with EF 35-40% and wall motion abnormalities. LHC in 4/22 showed left main/severe codominant LCx disease.  Echo in 3/22 with EF 35-40%, inferior and lateral hypokinesis.  Echo in 9/22 with EF 35%, mild RV dysfunction.  Now s/p CABG.  Probably NYHA class II.  Not volume overloaded by exam or thoracic impedance.   - Continue Toprol XL 50 mg daily.  - Continue Entresto 97/103 bid.  BMET today.   - Continue spironolactone 25 mg daily.    - Continue Jardiance 10 mg daily - Continue Lasix 20 mg every other day.  3. 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  Has been on warfarin.  Has had long-standing aphasia and right hemiparesis.   - Continue warfarin. CBC today.  - With ongoing warfarin use can stop ASA.  4. H/o seizure disorder: Continue Keppra.  5. CAD:  LHC showed 60-70% distal left main stenosis, heavily calcified proximal LCx with 60-70% stenosis at the ostium and discrete 99% stenosis in the proximal LCx.  I suspect that her CHB is related to chronic ischemia from LM-LCx disease worsened by nodal blockade.  Had CABG x 2 4/22 w/ LIMA-LAD, SVG-OM2.  No chest pain.  - On warfarin with stable CAD so can stop ASA.  - Continue statin, good lipids 5/23. 6. PAD: Difficult to palpate pedal pulses. - I will obtain ABIs.   Follow up in 4 months  Loralie Champagne  12/29/2021

## 2022-01-02 ENCOUNTER — Telehealth: Payer: Self-pay | Admitting: *Deleted

## 2022-01-02 NOTE — Progress Notes (Unsigned)
  Care Coordination Note  01/02/2022 Name: [name] MRN: [id number] DOB: 24-Jul-1950  [name] is a 71 y.o. year old female who is a primary care patient of [name], MD and is actively engaged with the care management team. I reached out to [name] by phone today to assist with re-scheduling a follow up visit with the RN Case Manager  Follow up plan: Unsuccessful telephone outreach attempt made.   George  Direct Dial: [phone]

## 2022-01-03 ENCOUNTER — Other Ambulatory Visit: Payer: Self-pay | Admitting: Diagnostic Neuroimaging

## 2022-01-03 DIAGNOSIS — G40909 Epilepsy, unspecified, not intractable, without status epilepticus: Secondary | ICD-10-CM

## 2022-01-03 NOTE — Addendum Note (Signed)
Addended by: Darleen Crocker on: 01/03/2022 05:18 PM   Modules accepted: Orders

## 2022-01-04 MED ORDER — LACOSAMIDE 100 MG PO TABS
100.0000 mg | ORAL_TABLET | Freq: Two times a day (BID) | ORAL | 4 refills | Status: DC
Start: 2022-01-04 — End: 2022-02-16

## 2022-01-05 ENCOUNTER — Other Ambulatory Visit: Payer: Self-pay

## 2022-01-05 MED ORDER — WARFARIN SODIUM 5 MG PO TABS: 5.0000 mg | ORAL_TABLET | Freq: Every day | ORAL | 0 refills | Status: DC

## 2022-01-05 NOTE — Telephone Encounter (Signed)
Patient husband called requesting a refill on her Warfarin 5 mg and stated she needed the medication due the cardiology office not reaching back out to him for a refill.

## 2022-01-05 NOTE — Telephone Encounter (Signed)
Please schedule appointment as soon as possible with Cardiology to recheck INR latest INR on chart was done 08/09/2020 will refill for 15 days until INR is rechecked.

## 2022-01-08 ENCOUNTER — Telehealth: Payer: Self-pay | Admitting: Family Medicine

## 2022-01-08 NOTE — Telephone Encounter (Signed)
Pt's husband called stating that the pharmacy is telling him they do not have the pt's Rx for her Lacosamide (VIMPAT) 100 MG TABS Please resend Rx to the [facility]. Pt is out of medication.

## 2022-01-08 NOTE — Telephone Encounter (Signed)
Reviewed pt chart. Dr. Leta Baptist e-scribed rx lacosamide to [facility] on 01/04/22 #60, 4 refills. I called them at [phone]. Spoke with [facility] pt assistance support line/[place].  They did not receive rx e-scribed. I provided VO instead since they did not receive. Spoke w/ [facility]/pharmacist. They will get prescription ready for pt.   Tried calling husband back. VM not set up.

## 2022-01-08 NOTE — Telephone Encounter (Signed)
Called patient husband and advised him that patient needs an INR check. Husband would like to know if Dr. Alain Honey want her to come in the office as a nurse visit to have her INR checked or do he need to see her in person since the last check was 08/09/2020. Please advised.

## 2022-01-10 ENCOUNTER — Ambulatory Visit (HOSPITAL_COMMUNITY)
Admission: RE | Admit: 2022-01-10 | Discharge: 2022-01-10 | Disposition: A | Discharge status: Home / Self Care | Payer: Medicare HMO | Source: Ambulatory Visit | Attending: Family Medicine | Admitting: Family Medicine

## 2022-01-10 DIAGNOSIS — I5022 Chronic systolic (congestive) heart failure: Secondary | ICD-10-CM

## 2022-01-10 DIAGNOSIS — E785 Hyperlipidemia, unspecified: Secondary | ICD-10-CM | POA: Diagnosis not present

## 2022-01-10 DIAGNOSIS — I2510 Atherosclerotic heart disease of native coronary artery without angina pectoris: Secondary | ICD-10-CM | POA: Diagnosis not present

## 2022-01-10 DIAGNOSIS — I082 Rheumatic disorders of both aortic and tricuspid valves: Secondary | ICD-10-CM | POA: Diagnosis not present

## 2022-01-10 DIAGNOSIS — Z8673 Personal history of transient ischemic attack (TIA), and cerebral infarction without residual deficits: Secondary | ICD-10-CM | POA: Diagnosis not present

## 2022-01-10 DIAGNOSIS — I509 Heart failure, unspecified: Secondary | ICD-10-CM | POA: Diagnosis not present

## 2022-01-10 DIAGNOSIS — I110 Hypertensive heart disease with heart failure: Secondary | ICD-10-CM | POA: Diagnosis not present

## 2022-01-10 DIAGNOSIS — Z951 Presence of aortocoronary bypass graft: Secondary | ICD-10-CM | POA: Insufficient documentation

## 2022-01-10 LAB — ECHOCARDIOGRAM COMPLETE
Area-P 1/2: 3.12 cm2
MV VTI: 1.61 cm2
S' Lateral: 4.2 cm

## 2022-01-10 IMAGING — DX DG CHEST 1V
1 series · 1 of 1 positions shown · non-contrast
Comparison: 04/30/2020

CLINICAL DATA: Status post CABG, cough

EXAM:
CHEST  1 VIEW

[view not recorded]
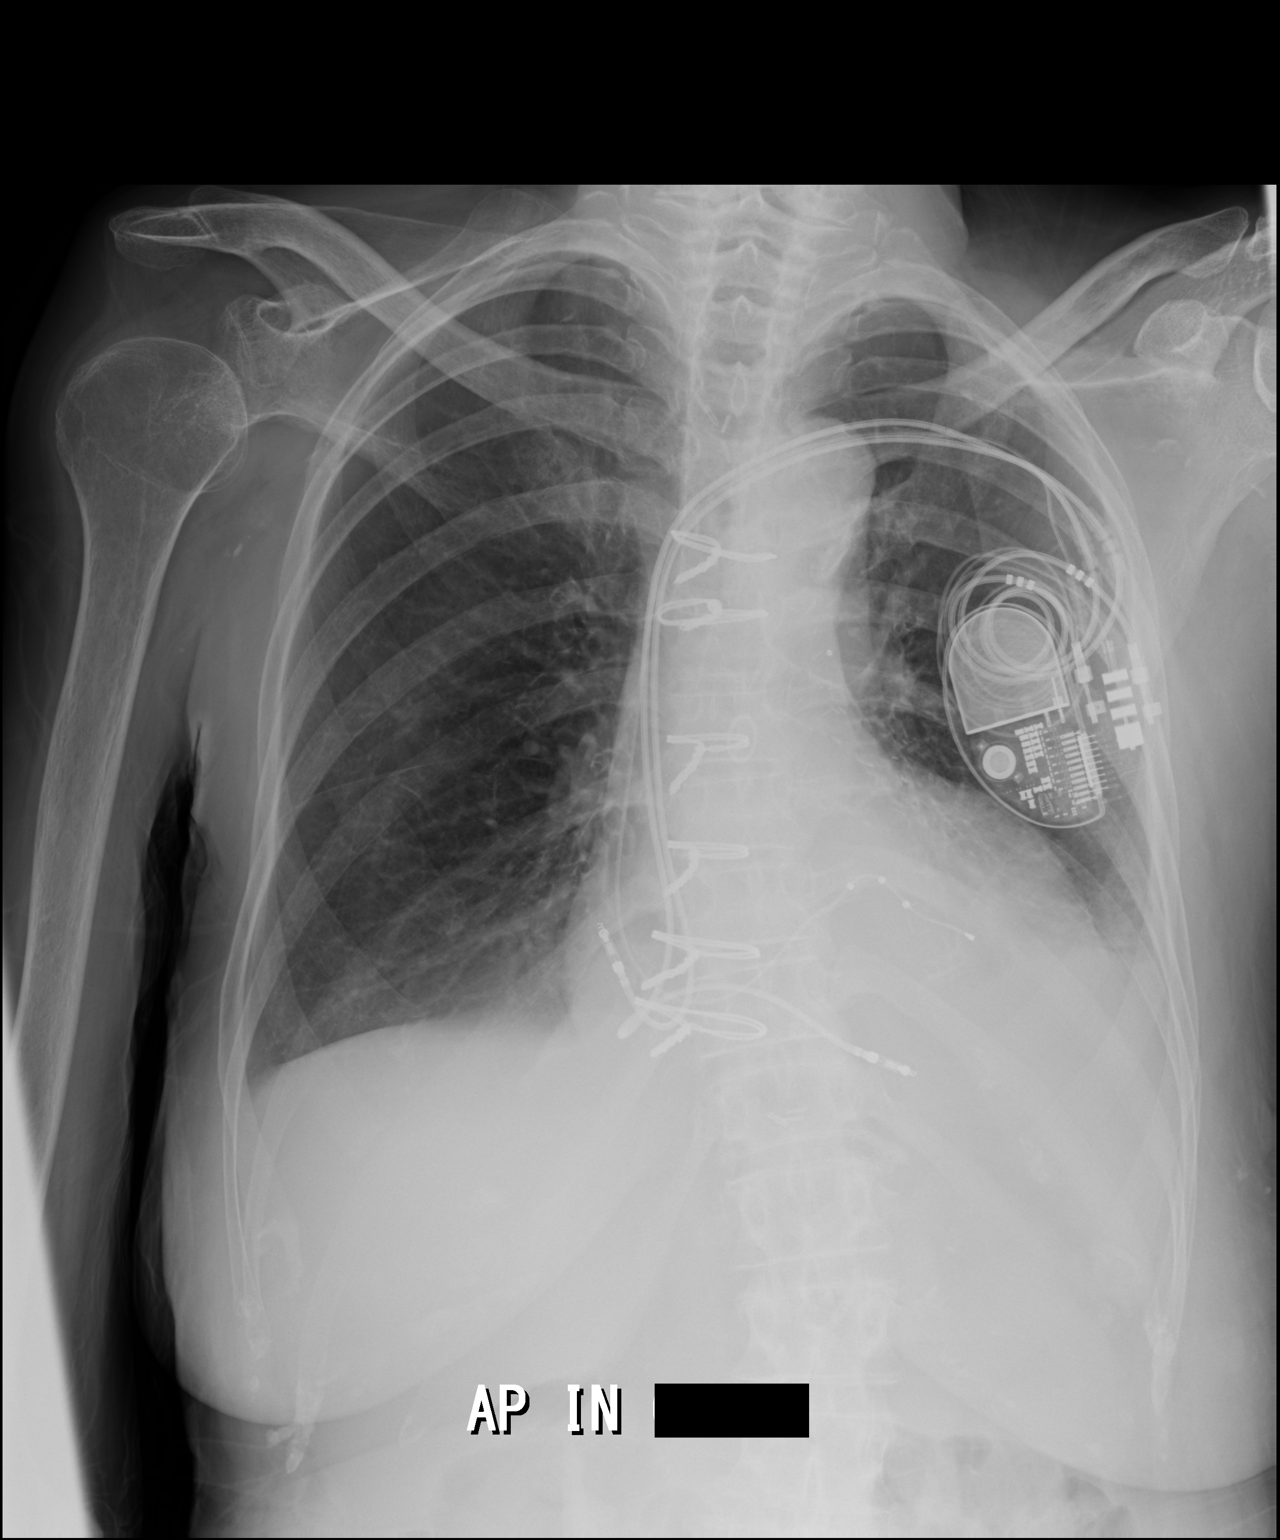

[1 of 1 positions shown; findings below may reference images not displayed]

FINDINGS: Cardiomegaly status post median sternotomy and CABG. Left chest
multi lead pacer. Small left, trace right pleural effusions and
associated atelectasis or consolidation, improved compared to prior
examination.
IMPRESSION: Small left, trace right pleural effusions and associated atelectasis
or consolidation, improved compared to prior examination.

## 2022-01-10 NOTE — Progress Notes (Signed)
  Echocardiogram 2D Echocardiogram has been performed.  [name] 01/10/2022, 10:03 AM

## 2022-01-20 ENCOUNTER — Other Ambulatory Visit: Payer: Self-pay | Admitting: Family

## 2022-01-29 ENCOUNTER — Ambulatory Visit (INDEPENDENT_AMBULATORY_CARE_PROVIDER_SITE_OTHER): Payer: Medicare HMO

## 2022-01-29 DIAGNOSIS — I5022 Chronic systolic (congestive) heart failure: Secondary | ICD-10-CM | POA: Diagnosis not present

## 2022-01-29 DIAGNOSIS — Z87891 Personal history of nicotine dependence: Secondary | ICD-10-CM

## 2022-01-29 DIAGNOSIS — I442 Atrioventricular block, complete: Secondary | ICD-10-CM

## 2022-01-30 LAB — CUP PACEART REMOTE DEVICE CHECK
Battery Remaining Longevity: 67 mo
Battery Remaining Percentage: 73 %
Battery Voltage: 2.99 V
Brady Statistic AP VP Percent: 35 %
Brady Statistic AP VS Percent: 1 %
Brady Statistic AS VP Percent: 61 %
Brady Statistic AS VS Percent: 1 %
Brady Statistic RA Percent Paced: 32 %
Date Time Interrogation Session: 20240108020016
Implantable Lead Connection Status: 753985
Implantable Lead Connection Status: 753985
Implantable Lead Connection Status: 753985
Implantable Lead Implant Date: 20220411
Implantable Lead Implant Date: 20220411
Implantable Lead Implant Date: 20220411
Implantable Lead Location: 753858
Implantable Lead Location: 753859
Implantable Lead Location: 753860
Implantable Pulse Generator Implant Date: 20220411
Lead Channel Impedance Value: 390 Ohm
Lead Channel Impedance Value: 410 Ohm
Lead Channel Impedance Value: 580 Ohm
Lead Channel Pacing Threshold Amplitude: 0.625 V
Lead Channel Pacing Threshold Amplitude: 0.75 V
Lead Channel Pacing Threshold Amplitude: 0.75 V
Lead Channel Pacing Threshold Pulse Width: 0.5 ms
Lead Channel Pacing Threshold Pulse Width: 0.5 ms
Lead Channel Pacing Threshold Pulse Width: 0.5 ms
Lead Channel Sensing Intrinsic Amplitude: 3.3 mV
Lead Channel Sensing Intrinsic Amplitude: 7.9 mV
Lead Channel Setting Pacing Amplitude: 2 V
Lead Channel Setting Pacing Amplitude: 2 V
Lead Channel Setting Pacing Amplitude: 2 V
Lead Channel Setting Pacing Pulse Width: 0.5 ms
Lead Channel Setting Pacing Pulse Width: 0.5 ms
Lead Channel Setting Sensing Sensitivity: 4 mV
Pulse Gen Model: 3562
Pulse Gen Serial Number: 6262141

## 2022-02-05 ENCOUNTER — Telehealth: Payer: Self-pay

## 2022-02-05 NOTE — Telephone Encounter (Signed)
Patients spouse returned the call and stated it's been a while since PT/INR was checked and it was being monitored by [facility], Oretha Ellis.   Mr.Hildenbrand would like for Dr.Miller (PCP) to take over monitoring PT/INR. Patient scheduled a PT/INR check with Monina (Dr.Miller's NP) for this Thursday. Mr.Raker said patient has about 15 pills left.

## 2022-02-05 NOTE — Telephone Encounter (Signed)
Incoming fax received from [facility] at [facility] for a refill on Warfarin Sodium 5 mg tablet. It appears that we are not monitoring patients PT/INR.  Outgoing call placed to patient to inquire about who is managing her PT/INR. Patient has a voicemail box that has not been set up yet and we will need to try to reach her again later.   Left message on voicemail for patient to return call when available on home number  Awaiting return call

## 2022-02-08 ENCOUNTER — Ambulatory Visit (INDEPENDENT_AMBULATORY_CARE_PROVIDER_SITE_OTHER): Payer: Medicare HMO | Admitting: Adult Health

## 2022-02-08 ENCOUNTER — Encounter: Payer: Self-pay | Admitting: Adult Health

## 2022-02-08 VITALS — BP 110/58 | HR 69 | Temp 97.5°F | Ht 67.0 in

## 2022-02-08 DIAGNOSIS — I69959 Hemiplegia and hemiparesis following unspecified cerebrovascular disease affecting unspecified side: Secondary | ICD-10-CM | POA: Diagnosis not present

## 2022-02-08 DIAGNOSIS — Z5181 Encounter for therapeutic drug level monitoring: Secondary | ICD-10-CM

## 2022-02-08 DIAGNOSIS — I5042 Chronic combined systolic (congestive) and diastolic (congestive) heart failure: Secondary | ICD-10-CM

## 2022-02-08 DIAGNOSIS — Z8673 Personal history of transient ischemic attack (TIA), and cerebral infarction without residual deficits: Secondary | ICD-10-CM | POA: Diagnosis not present

## 2022-02-08 DIAGNOSIS — Z7901 Long term (current) use of anticoagulants: Secondary | ICD-10-CM

## 2022-02-08 LAB — POCT INR: INR: 1.5 — AB (ref 2.0–3.0)

## 2022-02-08 MED ORDER — WARFARIN SODIUM 5 MG PO TABS: 2.5000 mg | ORAL_TABLET | ORAL | Status: DC

## 2022-02-08 MED ORDER — WARFARIN SODIUM 5 MG PO TABS: 5.0000 mg | ORAL_TABLET | Freq: Once | ORAL | 0 refills | Status: DC

## 2022-02-08 NOTE — Patient Instructions (Signed)
Prothrombin Time, International Normalized Ratio Test Why am I having this test? A prothrombin time (pro-time, PT) and international normalized ratio (INR) test measures how long it takes your blood to clot. You may have this test if: You have certain medical conditions that cause abnormal bleeding or blood clotting. These can include: Liver disease. Systemic infection (sepsis). Bleeding disorders that are passed from parent to child (inherited). You are taking a medicine to prevent excessive blood clotting (anticoagulant), such as warfarin. If you are taking warfarin, you will likely be asked to have this test done at regular intervals. The results of this test will help your health care provider determine what dose of warfarin you need based on how quickly or slowly your blood clots. It is very important to have this test done as often as your health care provider recommends. What is being tested? A prothrombin time (pro-time, PT) test measures how many seconds it takes your blood to clot. The international normalized ratio (INR) is a calculation of blood clotting time based on your PT result. Most labs report both PT and INR values when reporting blood clotting times. What kind of sample is taken?  A blood sample is required for this test. It is usually collected by inserting a needle into a blood vessel. It may also be done by taking a blood sample from your finger (fingerstick). Tell a health care provider about: Any bleeding problems you have. All medicines you are taking, including vitamins, herbs, eye drops, creams, and over-the-counter medicines. Do not stop, add, or change any medicines without letting your health care provider know. The foods you regularly eat, especially foods that contain moderate or high amounts of vitamin K. It is important to eat a consistent amount of foods rich in vitamin K. Let your health care provider know if you have recently changed your diet. If you drink  alcohol. This can affect your lab results. How are the results reported? Your test results will be reported as values. Your health care provider will compare your results to normal ranges that were established after testing a large group of people (reference ranges). Reference ranges may vary among labs and hospitals. For this test, common reference ranges are: Without anticoagulant treatment (control value): 11.0-12.5 seconds; 85-100%. INR: 0.8-1.1. If you are taking warfarin, talk with your health care provider about what your INR result should be. Generally, an INR of 2.0-3.0 is desired for blood clot prevention. This depends on your medical conditions. What do the results mean? A higher than normal PT or INR means that your blood takes longer to form a clot. This can result from: Certain medicines, such as antibiotics or warfarin. Liver disease. Lack of certain proteins that form clots (coagulation factors). Lack of some vitamins. A lower than normal PT or INR means that your blood can form a clot easily. This can result from: Supplements that contain vitamin K. Medicines that contain estrogen, such as birth control pills or hormone replacement. Cancer. If you are taking warfarin or another anticoagulant, your results may be different. Talk with your health care provider about what your test results should be and what they mean. Questions to ask your health care provider Ask your health care provider or the department that is doing the test: When will my results be ready? How will I get my results? What are my treatment options? What other tests do I need? What are my next steps? Summary A prothrombin time (pro-time, PT) test measures how many seconds  it takes your blood to clot. The international normalized ratio (INR) is a calculation of blood clotting time based on your PT result. You may have this test if you have a medical condition that causes abnormal bleeding or blood clotting,  or if you are taking a medicine to prevent abnormal blood clotting. A test result that is higher than normal indicates that your blood is taking longer to form a clot. This result may occur because you lack some vitamins, take certain medicines, or have certain medical conditions. If you are taking warfarin, talk with your health care provider about what your result should be. Talk with your health care provider about what your results mean. This information is not intended to replace advice given to you by your health care provider. Make sure you discuss any questions you have with your health care provider. Document Revised: 07/28/2020 Document Reviewed: 07/28/2020 Elsevier Patient Education  [employer].

## 2022-02-08 NOTE — Progress Notes (Unsigned)
[facility]  Provider:   Code Status: *** Goals of Care:     05/26/2020   11:09 AM  Advanced Directives  Does Patient Have a Medical Advance Directive? No  Would patient like information on creating a medical advance directive? No - Patient declined     No chief complaint on file.   HPI: Patient is a 72 y.o. female seen today for an acute visit for  Past Medical History:  Diagnosis Date   Adenomatous polyp of colon 09/2012   repeat colonoscopy in 5 years by Dr. Sharlett Iles   CHF (congestive heart failure) ([place])    EF 15-20% as of 05/28/11 (Dr Legrand [facility] D/C summary)   CVA (cerebral infarction) 12/2007   H/O heart bypass surgery 2022   Hemiparesis ([place])    Hyperlipidemia    Hypertension    Seizures ([place])    Sickle cell anemia ([place])    Stroke ([place])    Vitamin D deficiency 2010    Past Surgical History:  Procedure Laterality Date   ABDOMINAL HYSTERECTOMY     BIV PACEMAKER INSERTION CRT-P N/A 04/29/2020   Procedure: BIV PACEMAKER INSERTION CRT-P;  Surgeon: Constance Haw, MD;  Location: [place] [facility];  Service: Cardiovascular;  Laterality: N/A;   CORONARY ARTERY BYPASS GRAFT N/A 04/25/2020   Procedure: CORONARY ARTERY BYPASS GRAFTING (CABG) TIMES TWO USING LEFT INTERNAL MAMMARY ARTERY AND RIGHT GREATER SAPHENOUS VEIN HARVESTED ENDOSCOPICALLY;  Surgeon: Melrose Nakayama, MD;  Location: [place];  Service: Open Heart Surgery;  Laterality: N/A;   FRACTURE SURGERY     HIP SURGERY     LEFT HEART CATH AND CORONARY ANGIOGRAPHY N/A 04/22/2020   Procedure: LEFT HEART CATH AND CORONARY ANGIOGRAPHY;  Surgeon: Larey Dresser, MD;  Location: [place] [facility];  Service: Cardiovascular;  Laterality: N/A;   TEE WITHOUT CARDIOVERSION  04/25/2020   Procedure: TRANSESOPHAGEAL ECHOCARDIOGRAM (TEE);  Surgeon: Melrose Nakayama, MD;  [facility];  Service: Open Heart Surgery;;   TEMPORARY PACEMAKER N/A 04/21/2020   Procedure: TEMPORARY PACEMAKER;  Surgeon: Larey Dresser, MD;  Location: [place] [facility];  Service: Cardiovascular;  Laterality: N/A;   TUBAL LIGATION      Allergies  Allergen Reactions   Lexiscan [Regadenoson] Other (See Comments)    Perioral tingling and throat tightness    Outpatient Encounter Medications as of 02/08/2022  Medication Sig   acetaminophen (TYLENOL) 325 MG tablet Take 650 mg by mouth 2 (two) times a week. As needed   atorvastatin (LIPITOR) 80 MG tablet Take 1 tablet (80 mg total) by mouth at bedtime.   Cholecalciferol 50 MCG (2000 UT) TABS Take 1 tablet by mouth daily in the afternoon.   empagliflozin (JARDIANCE) 10 MG TABS tablet Take 1 tablet (10 mg total) by mouth daily.   furosemide (LASIX) 20 MG tablet TAKE 1 TABLET(20 MG) BY MOUTH EVERY OTHER DAY   Lacosamide 100 MG TABS Take 1 tablet (100 mg total) by mouth in the morning and at bedtime.   levETIRAcetam (KEPPRA) 750 MG tablet Take 2 tablets (1,500 mg total) by mouth 2 (two) times daily.   metoprolol succinate (TOPROL-XL) 50 MG 24 hr tablet Take 1 tablet (50 mg total) by mouth daily. Take with or immediately following a meal.   Nutritional Supplements (NUTRITIONAL SUPPLEMENT PO) Take 120 mLs by mouth once. Medpass for weight gain.   sacubitril-valsartan (ENTRESTO) 97-103 MG Take 1 tablet by mouth 2 (two) times daily.   spironolactone (ALDACTONE) 25  MG tablet TAKE 1 TABLET(25 MG) BY MOUTH DAILY   warfarin (COUMADIN) 5 MG tablet TAKE 1 TABLET(5 MG) BY MOUTH DAILY   No facility-administered encounter medications on file as of 02/08/2022.    Review of Systems:  Review of Systems  Constitutional:  Negative for appetite change, chills, fatigue and fever.  HENT:  Negative for congestion, hearing loss, rhinorrhea and sore throat.   Eyes: Negative.   Respiratory:  Negative for cough, shortness of breath and wheezing.   Cardiovascular:  Negative for chest pain, palpitations and leg swelling.  Gastrointestinal:  Negative for abdominal pain, constipation, diarrhea,  nausea and vomiting.  Genitourinary:  Negative for dysuria.  Musculoskeletal:  Negative for arthralgias, back pain and myalgias.  Skin:  Negative for color change, rash and wound.  Neurological:  Negative for dizziness, weakness and headaches.  Psychiatric/Behavioral:  Negative for behavioral problems. The patient is not nervous/anxious.     Health Maintenance  Topic Date Due   Zoster Vaccines- Shingrix (1 of 2) Never done   Medicare Annual Wellness (AWV)  04/16/2017   COLONOSCOPY (Pts 45-75yr Insurance coverage will need to be confirmed)  10/21/2017   COVID-19 Vaccine (4 - 2023-24 season) 09/22/2021   DTaP/Tdap/Td (2 - Td or Tdap) 12/13/2022   MAMMOGRAM  12/12/2023   Pneumonia Vaccine 72 Years old  Completed   INFLUENZA VACCINE  Completed   DEXA SCAN  Completed   Hepatitis C Screening  Completed   HPV VACCINES  Aged Out    Physical Exam: There were no vitals filed for this visit. There is no height or weight on file to calculate BMI. Physical Exam Constitutional:      Appearance: Normal appearance.  HENT:     Head: Normocephalic and atraumatic.     Nose: Nose normal.     Mouth/Throat:     Mouth: Mucous membranes are moist.  Eyes:     Conjunctiva/sclera: Conjunctivae normal.  Cardiovascular:     Rate and Rhythm: Normal rate and regular rhythm.     Comments: Left chest pacemaker Pulmonary:     Effort: Pulmonary effort is normal.     Breath sounds: Normal breath sounds.  Abdominal:     General: Bowel sounds are normal.     Palpations: Abdomen is soft.  Musculoskeletal:     Cervical back: Normal range of motion.  Skin:    General: Skin is warm and dry.  Neurological:     Mental Status: She is alert. Mental status is at baseline.     Comments: Aphasic and has right hemiplegia  Psychiatric:        Mood and Affect: Mood normal.        Behavior: Behavior normal.        Thought Content: Thought content normal.        Judgment: Judgment normal.     Labs  reviewed: Basic Metabolic Panel: Recent Labs    06/12/21 0935 09/12/21 1039 12/28/21 1027  NA 140 140 136  K 4.4 4.4 4.5  CL 110 108 105  CO2 '23 22 23  '$ GLUCOSE 91 88 79  BUN '23 19 22  '$ CREATININE 1.19* 1.14* 1.11*  CALCIUM 9.1 9.2 9.2   Liver Function Tests: Recent Labs    02/28/21 1038  AST 21  ALT 8  ALKPHOS 123*  BILITOT 0.3  PROT 7.5  ALBUMIN 4.6   No results for input(s): "LIPASE", "AMYLASE" in the last 8760 hours. No results for input(s): "AMMONIA" in the last 8760 hours. CBC:  Recent Labs    06/12/21 0935 09/12/21 1039 12/28/21 1027  WBC 5.1 5.3 5.0  HGB 11.9* 12.3 13.2  HCT 38.1 38.1 41.0  MCV 90.7 89.6 89.7  PLT 224 259 244   Lipid Panel: Recent Labs    06/12/21 0935  CHOL 126  HDL 46  LDLCALC 67  TRIG 63  CHOLHDL 2.7   Lab Results  Component Value Date   HGBA1C 6.1 (H) 04/22/2020    Procedures since last visit: CUP PACEART REMOTE DEVICE CHECK  Result Date: 01/30/2022 Scheduled remote reviewed. Normal device function.  1 AMS, 6sec in duration Next remote 91 days. LA  ECHOCARDIOGRAM COMPLETE  Result Date: 01/10/2022    ECHOCARDIOGRAM REPORT   Patient Name:   [name] [place] Date of Exam: 01/10/2022 Medical Rec #:  [id number]            Height:       67.0 in Accession #:    [id number]           Weight:       125.6 lb Date of Birth:  1950/10/10           BSA:          1.659 m Patient Age:    50 years             BP:           119/73 mmHg Patient Gender: F                    HR:           54 bpm. Exam Location:  Outpatient Procedure: 2D Echo Indications:    congestive heart failure  History:        Patient has prior history of Echocardiogram examinations, most                 recent 10/21/2020. CHF and Cardiomyopathy, CAD, Prior CABG,                 history of stroke, Arrythmias:heart block; Risk                 Factors:Hypertension and Dyslipidemia.  Sonographer:    Johny Chess RDCS Referring Phys: [place]  1. Left  ventricular ejection fraction, by estimation, is 25 to 30%. The left ventricle has severely decreased function. The left ventricle demonstrates global hypokinesis. The left ventricular internal cavity size was moderately dilated. Left ventricular diastolic parameters are consistent with Grade I diastolic dysfunction (impaired relaxation).  2. Right ventricular systolic function is moderately reduced. The right ventricular size is moderately enlarged. There is normal pulmonary artery systolic pressure.  3. The mitral valve is abnormal. Trivial mitral valve regurgitation. No evidence of mitral stenosis.  4. Tricuspid valve regurgitation is severe.  5. The aortic valve is tricuspid. There is moderate calcification of the aortic valve. Aortic valve regurgitation is mild to moderate. Aortic valve sclerosis is present, with no evidence of aortic valve stenosis.  6. The inferior vena cava is normal in size with greater than 50% respiratory variability, suggesting right atrial pressure of 3 mmHg. FINDINGS  Left Ventricle: Left ventricular ejection fraction, by estimation, is 25 to 30%. The left ventricle has severely decreased function. The left ventricle demonstrates global hypokinesis. The left ventricular internal cavity size was moderately dilated. There is no left ventricular hypertrophy. Left ventricular diastolic parameters are consistent with Grade I diastolic dysfunction (impaired relaxation). Right Ventricle: The right ventricular  size is moderately enlarged. No increase in right ventricular wall thickness. Right ventricular systolic function is moderately reduced. There is normal pulmonary artery systolic pressure. The tricuspid regurgitant velocity is 2.57 m/s, and with an assumed right atrial pressure of 3 mmHg, the estimated right ventricular systolic pressure is 78.4 mmHg. Left Atrium: Left atrial size was normal in size. Right Atrium: Right atrial size was normal in size. Pericardium: There is no evidence of  pericardial effusion. Mitral Valve: The mitral valve is abnormal. There is mild thickening of the mitral valve leaflet(s). There is mild calcification of the mitral valve leaflet(s). Mild mitral annular calcification. Trivial mitral valve regurgitation. No evidence of mitral valve stenosis. MV peak gradient, 3.5 mmHg. The mean mitral valve gradient is 1.5 mmHg. Tricuspid Valve: The tricuspid valve is normal in structure. Tricuspid valve regurgitation is severe. No evidence of tricuspid stenosis. Aortic Valve: The aortic valve is tricuspid. There is moderate calcification of the aortic valve. Aortic valve regurgitation is mild to moderate. Aortic valve sclerosis is present, with no evidence of aortic valve stenosis. Pulmonic Valve: The pulmonic valve was normal in structure. Pulmonic valve regurgitation is not visualized. No evidence of pulmonic stenosis. Aorta: The aortic root is normal in size and structure. Venous: The inferior vena cava is normal in size with greater than 50% respiratory variability, suggesting right atrial pressure of 3 mmHg. IAS/Shunts: No atrial level shunt detected by color flow Doppler.  LEFT VENTRICLE PLAX 2D LVIDd:         4.80 cm   Diastology LVIDs:         4.20 cm   LV e' medial:    4.24 cm/s LV PW:         0.90 cm   LV E/e' medial:  17.6 LV IVS:        0.80 cm   LV e' lateral:   7.07 cm/s LVOT diam:     1.80 cm   LV E/e' lateral: 10.6 LV SV:         47 LV SV Index:   28 LVOT Area:     2.54 cm  RIGHT VENTRICLE          IVC RV Basal diam:  3.10 cm  IVC diam: 1.10 cm LEFT ATRIUM             Index        RIGHT ATRIUM           Index LA diam:        3.10 cm 1.87 cm/m   RA Area:     14.10 cm LA Vol (A2C):   47.9 ml 28.86 ml/m  RA Volume:   36.00 ml  21.69 ml/m LA Vol (A4C):   47.8 ml 28.80 ml/m LA Biplane Vol: 51.5 ml 31.03 ml/m  AORTIC VALVE LVOT Vmax:   94.30 cm/s LVOT Vmean:  59.000 cm/s LVOT VTI:    0.185 m  AORTA Ao Root diam: 2.80 cm Ao Asc diam:  2.80 cm MITRAL VALVE                TRICUSPID VALVE MV Area (PHT): 3.12 cm    TR Peak grad:   26.4 mmHg MV Area VTI:   1.61 cm    TR Vmax:        257.00 cm/s MV Peak grad:  3.5 mmHg MV Mean grad:  1.5 mmHg    SHUNTS MV Vmax:       0.94 m/s    Systemic VTI:  0.18 m MV Vmean:      55.5 cm/s   Systemic Diam: 1.80 cm MV Decel Time: 243 msec MV E velocity: 74.70 cm/s MV A velocity: 85.40 cm/s MV E/A ratio:  0.87 Jenkins Rouge MD Electronically signed by Jenkins Rouge MD Signature Date/Time: 01/10/2022/10:28:00 AM    Final     Assessment/Plan There are no diagnoses linked to this encounter.   Labs/tests ordered:  * No order type specified * Next appt:  06/06/2022

## 2022-02-12 ENCOUNTER — Telehealth: Payer: Self-pay | Admitting: Family Medicine

## 2022-02-12 MED ORDER — VIMPAT 100 MG PO TABS: 1.0000 | ORAL_TABLET | Freq: Two times a day (BID) | ORAL | 0 refills | Status: DC

## 2022-02-12 NOTE — Telephone Encounter (Signed)
Called [facility] pharmacy at [phone], option 2 ([employer] patient assistance). We provided VO for VIMPAT on 01/04/22 #60, 4 refills. Spoke w/ Brisa.  Program eligibility ended 01/21/22.  She confirmed they received paperwork and it is being processed. She transferred me to department that processes forms. I had to LVM for them to call back to provide update on pt.    Dr. Leta Baptist approved provided two sample boxes Vimpat '100mg'$  #14 each box. Lot: [id number], exp: 02/2024, [place]: [id number]. I called husband. They will come pick up samples boxes. Placed up front for pick up. Aware this is 2 wk worth of medication until application can be fully processed.

## 2022-02-12 NOTE — Telephone Encounter (Signed)
Pt's spouse reports that they have submitted the application last Friday for the Vimpat, pt has been out of the medication for 2 days now. Please call

## 2022-02-13 NOTE — Progress Notes (Incomplete)
[facility]  Provider:  Durenda Age DNP  Code Status:  Full Code  Goals of Care:     05/26/2020   11:09 AM  Advanced Directives  Does Patient Have a Medical Advance Directive? No  Would patient like information on creating a medical advance directive? No - Patient declined     Chief Complaint  Patient presents with  . Acute Visit    Patient presents today for INR check.    HPI: Patient is a 72 y.o. female seen today for an acute visit for INR check. She is accompanied today by her husband. She is aphasic. She takes Coumadin for history of CVA. INR goal is 2.0 to 3.0. Today, INR was 1.5, subtherapeutic.  Past Medical History:  Diagnosis Date  . Adenomatous polyp of colon 09/2012   repeat colonoscopy in 5 years by Dr. Sharlett Iles  . CHF (congestive heart failure) ([facility])    EF 15-20% as of 05/28/11 (Dr Legrand [facility] D/C summary)  . CVA (cerebral infarction) 12/2007  . H/O heart bypass surgery 2022  . Hemiparesis ([place])   . Hyperlipidemia   . Hypertension   . Seizures ([place])   . Sickle cell anemia ([facility])   . Stroke ([place])   . Vitamin D deficiency 2010    Past Surgical History:  Procedure Laterality Date  . ABDOMINAL HYSTERECTOMY    . BIV PACEMAKER INSERTION CRT-P N/A 04/29/2020   Procedure: BIV PACEMAKER INSERTION CRT-P;  Surgeon: Constance Haw, MD;  Location: [place] [facility];  Service: Cardiovascular;  Laterality: N/A;  . CORONARY ARTERY BYPASS GRAFT N/A 04/25/2020   Procedure: CORONARY ARTERY BYPASS GRAFTING (CABG) TIMES TWO USING LEFT INTERNAL MAMMARY ARTERY AND RIGHT GREATER SAPHENOUS VEIN HARVESTED ENDOSCOPICALLY;  Surgeon: Melrose Nakayama, MD;  Location: [place];  Service: Open Heart Surgery;  Laterality: N/A;  . FRACTURE SURGERY    . HIP SURGERY    . LEFT HEART CATH AND CORONARY ANGIOGRAPHY N/A 04/22/2020   Procedure: LEFT HEART CATH AND CORONARY ANGIOGRAPHY;  Surgeon: Larey Dresser, MD;  Location: [place] [facility];  Service:  Cardiovascular;  Laterality: N/A;  . TEE WITHOUT CARDIOVERSION  04/25/2020   Procedure: TRANSESOPHAGEAL ECHOCARDIOGRAM (TEE);  Surgeon: Melrose Nakayama, MD;  Location: [facility] OR;  Service: Open Heart Surgery;;  . TEMPORARY PACEMAKER N/A 04/21/2020   Procedure: TEMPORARY PACEMAKER;  Surgeon: Larey Dresser, MD;  Location: [place] [facility];  Service: Cardiovascular;  Laterality: N/A;  . TUBAL LIGATION      Allergies  Allergen Reactions  . Lexiscan [Regadenoson] Other (See Comments)    Perioral tingling and throat tightness    Outpatient Encounter Medications as of 02/08/2022  Medication Sig  . acetaminophen (TYLENOL) 325 MG tablet Take 650 mg by mouth 2 (two) times a week. As needed  . atorvastatin (LIPITOR) 80 MG tablet Take 1 tablet (80 mg total) by mouth at bedtime.  . Cholecalciferol 50 MCG (2000 UT) TABS Take 1 tablet by mouth daily in the afternoon.  . empagliflozin (JARDIANCE) 10 MG TABS tablet Take 1 tablet (10 mg total) by mouth daily.  . furosemide (LASIX) 20 MG tablet TAKE 1 TABLET(20 MG) BY MOUTH EVERY OTHER DAY  . Lacosamide 100 MG TABS Take 1 tablet (100 mg total) by mouth in the morning and at bedtime.  . levETIRAcetam (KEPPRA) 750 MG tablet Take 2 tablets (1,500 mg total) by mouth 2 (two) times daily.  . metoprolol succinate (TOPROL-XL) 50 MG 24 hr tablet Take 1  tablet (50 mg total) by mouth daily. Take with or immediately following a meal.  . Nutritional Supplements (NUTRITIONAL SUPPLEMENT PO) Take 120 mLs by mouth once. Medpass for weight gain.  . sacubitril-valsartan (ENTRESTO) 97-103 MG Take 1 tablet by mouth 2 (two) times daily.  Marland Kitchen spironolactone (ALDACTONE) 25 MG tablet TAKE 1 TABLET(25 MG) BY MOUTH DAILY  . warfarin (COUMADIN) 5 MG tablet Take 0.5 tablets (2.5 mg total) by mouth once a week. On Fridays  . [DISCONTINUED] warfarin (COUMADIN) 5 MG tablet TAKE 1 TABLET(5 MG) BY MOUTH DAILY  . warfarin (COUMADIN) 5 MG tablet Take 1 tablet (5 mg total) by mouth one time  only at 4 PM for 15 doses. Except Fridays.   No facility-administered encounter medications on file as of 02/08/2022.    Review of Systems:   Unable to obtain due to aphasia.   Health Maintenance  Topic Date Due  . Zoster Vaccines- Shingrix (1 of 2) Never done  . Medicare Annual Wellness (AWV)  04/16/2017  . COLONOSCOPY (Pts 45-13yr Insurance coverage will need to be confirmed)  10/21/2017  . COVID-19 Vaccine (4 - 2023-24 season) 09/22/2021  . DTaP/Tdap/Td (2 - Td or Tdap) 12/13/2022  . MAMMOGRAM  12/12/2023  . Pneumonia Vaccine 72 Years old  Completed  . INFLUENZA VACCINE  Completed  . DEXA SCAN  Completed  . Hepatitis C Screening  Completed  . HPV VACCINES  Aged Out    Physical Exam: Vitals:   02/08/22 1023  BP: (!) 110/58  Pulse: 69  Temp: (!) 97.5 F (36.4 C)  SpO2: 99%  Height: '5\' 7"'$  (1.702 m)   Body mass index is 19.67 kg/m. Physical Exam Constitutional:      General: She is not in acute distress.    Appearance: Normal appearance.  HENT:     Head: Normocephalic and atraumatic.     Nose: Nose normal.     Mouth/Throat:     Mouth: Mucous membranes are moist.  Eyes:     Conjunctiva/sclera: Conjunctivae normal.  Cardiovascular:     Rate and Rhythm: Normal rate and regular rhythm.     Comments: Left chest pacemaker Pulmonary:     Effort: Pulmonary effort is normal.     Breath sounds: Normal breath sounds.  Abdominal:     General: Bowel sounds are normal.     Palpations: Abdomen is soft.  Musculoskeletal:     Cervical back: Normal range of motion.  Skin:    General: Skin is warm and dry.  Neurological:     Mental Status: She is alert. Mental status is at baseline.     Comments: Aphasic and has right hemiplegia  Psychiatric:        Mood and Affect: Mood normal.        Behavior: Behavior normal.     Labs reviewed: Basic Metabolic Panel: Recent Labs    06/12/21 0935 09/12/21 1039 12/28/21 1027  NA 140 140 136  K 4.4 4.4 4.5  CL 110 108 105   CO2 '23 22 23  '$ GLUCOSE 91 88 79  BUN '23 19 22  '$ CREATININE 1.19* 1.14* 1.11*  CALCIUM 9.1 9.2 9.2   Liver Function Tests: Recent Labs    02/28/21 1038  AST 21  ALT 8  ALKPHOS 123*  BILITOT 0.3  PROT 7.5  ALBUMIN 4.6   No results for input(s): "LIPASE", "AMYLASE" in the last 8760 hours. No results for input(s): "AMMONIA" in the last 8760 hours. CBC: Recent Labs  06/12/21 0935 09/12/21 1039 12/28/21 1027  WBC 5.1 5.3 5.0  HGB 11.9* 12.3 13.2  HCT 38.1 38.1 41.0  MCV 90.7 89.6 89.7  PLT 224 259 244   Lipid Panel: Recent Labs    06/12/21 0935  CHOL 126  HDL 46  LDLCALC 67  TRIG 63  CHOLHDL 2.7   Lab Results  Component Value Date   HGBA1C 6.1 (H) 04/22/2020    Procedures since last visit: CUP PACEART REMOTE DEVICE CHECK  Result Date: 01/30/2022 Scheduled remote reviewed. Normal device function.  1 AMS, 6sec in duration Next remote 91 days. LA   Assessment/Plan There are no diagnoses linked to this encounter.   Labs/tests ordered:  * No order type specified * Next appt:  06/06/2022

## 2022-02-16 ENCOUNTER — Encounter: Payer: Self-pay | Admitting: Adult Health

## 2022-02-16 ENCOUNTER — Ambulatory Visit (INDEPENDENT_AMBULATORY_CARE_PROVIDER_SITE_OTHER): Payer: Medicare HMO | Admitting: Adult Health

## 2022-02-16 VITALS — BP 130/70 | Temp 97.3°F | Ht 67.0 in

## 2022-02-16 DIAGNOSIS — M79674 Pain in right toe(s): Secondary | ICD-10-CM

## 2022-02-16 DIAGNOSIS — Z7901 Long term (current) use of anticoagulants: Secondary | ICD-10-CM

## 2022-02-16 DIAGNOSIS — Z8673 Personal history of transient ischemic attack (TIA), and cerebral infarction without residual deficits: Secondary | ICD-10-CM | POA: Diagnosis not present

## 2022-02-16 DIAGNOSIS — I10 Essential (primary) hypertension: Secondary | ICD-10-CM

## 2022-02-16 DIAGNOSIS — Z5181 Encounter for therapeutic drug level monitoring: Secondary | ICD-10-CM | POA: Diagnosis not present

## 2022-02-16 DIAGNOSIS — I69959 Hemiplegia and hemiparesis following unspecified cerebrovascular disease affecting unspecified side: Secondary | ICD-10-CM

## 2022-02-16 LAB — POCT INR: INR: 1.4 — AB (ref 2.0–3.0)

## 2022-02-16 MED ORDER — WARFARIN SODIUM 5 MG PO TABS: 5.0000 mg | ORAL_TABLET | Freq: Once | ORAL | 0 refills | Status: DC

## 2022-02-16 NOTE — Progress Notes (Signed)
[facility] clinic  Provider: Durenda Age DNP  Code Status:  Full Code  Goals of Care:     05/26/2020   11:09 AM  Advanced Directives  Does Patient Have a Medical Advance Directive? No  Would patient like information on creating a medical advance directive? No - Patient declined     Chief Complaint  Patient presents with   Medical Management of Chronic Issues    Patient presents today for INR follow-up.    HPI: Patient is a 72 y.o. female seen today for medical management of chronic issue.She is for INR check today. She takes Coumadin for history of CVA. Last INR 1.5, 02/08/22. INR goal is 2.0 - 3.0. Today, INR was 1.4, subtherapeutic. She is currently on Coumadin 5 mg daily except Fridays wherein she takes Coumadin 2.5 mg. She complained of right great toe pain. She is aphasic. No erythema on right great toe.  Past Medical History:  Diagnosis Date   Adenomatous polyp of colon 09/2012   repeat colonoscopy in 5 years by Dr. Sharlett Iles   CHF (congestive heart failure) ([place])    EF 15-20% as of 05/28/11 (Dr Legrand [facility] summary)   CVA (cerebral infarction) 12/2007   H/O heart bypass surgery 2022   Hemiparesis ([place])    Hyperlipidemia    Hypertension    Seizures ([place])    Sickle cell anemia ([place])    Stroke ([place])    Vitamin D deficiency 2010    Past Surgical History:  Procedure Laterality Date   ABDOMINAL HYSTERECTOMY     BIV PACEMAKER INSERTION CRT-P N/A 04/29/2020   Procedure: BIV PACEMAKER INSERTION CRT-P;  Surgeon: Constance Haw, MD;  Location: [place] [facility];  Service: Cardiovascular;  Laterality: N/A;   CORONARY ARTERY BYPASS GRAFT N/A 04/25/2020   Procedure: CORONARY ARTERY BYPASS GRAFTING (CABG) TIMES TWO USING LEFT INTERNAL MAMMARY ARTERY AND RIGHT GREATER SAPHENOUS VEIN HARVESTED ENDOSCOPICALLY;  Surgeon: Melrose Nakayama, MD;  Location: [place];  Service: Open Heart Surgery;  Laterality: N/A;   FRACTURE SURGERY     HIP SURGERY     LEFT HEART  CATH AND CORONARY ANGIOGRAPHY N/A 04/22/2020   Procedure: LEFT HEART CATH AND CORONARY ANGIOGRAPHY;  Surgeon: Larey Dresser, MD;  Location: [place] [facility];  Service: Cardiovascular;  Laterality: N/A;   TEE WITHOUT CARDIOVERSION  04/25/2020   Procedure: TRANSESOPHAGEAL ECHOCARDIOGRAM (TEE);  Surgeon: Melrose Nakayama, MD;  [facility];  Service: Open Heart Surgery;;   TEMPORARY PACEMAKER N/A 04/21/2020   Procedure: TEMPORARY PACEMAKER;  Surgeon: Larey Dresser, MD;  Location: [place] [facility];  Service: Cardiovascular;  Laterality: N/A;   TUBAL LIGATION      Allergies  Allergen Reactions   Lexiscan [Regadenoson] Other (See Comments)    Perioral tingling and throat tightness    Outpatient Encounter Medications as of 02/16/2022  Medication Sig   acetaminophen (TYLENOL) 325 MG tablet Take 650 mg by mouth 2 (two) times a week. As needed   atorvastatin (LIPITOR) 80 MG tablet Take 1 tablet (80 mg total) by mouth at bedtime.   Cholecalciferol 50 MCG (2000 UT) TABS Take 1 tablet by mouth daily in the afternoon.   empagliflozin (JARDIANCE) 10 MG TABS tablet Take 1 tablet (10 mg total) by mouth daily.   furosemide (LASIX) 20 MG tablet TAKE 1 TABLET(20 MG) BY MOUTH EVERY OTHER DAY   levETIRAcetam (KEPPRA) 750 MG tablet Take 2 tablets (1,500 mg total) by mouth 2 (two) times daily.  metoprolol succinate (TOPROL-XL) 50 MG 24 hr tablet Take 1 tablet (50 mg total) by mouth daily. Take with or immediately following a meal.   Nutritional Supplements (NUTRITIONAL SUPPLEMENT PO) Take 120 mLs by mouth once. Medpass for weight gain.   sacubitril-valsartan (ENTRESTO) 97-103 MG Take 1 tablet by mouth 2 (two) times daily.   spironolactone (ALDACTONE) 25 MG tablet TAKE 1 TABLET(25 MG) BY MOUTH DAILY   VIMPAT 100 MG TABS Take 1 tablet (100 mg total) by mouth 2 (two) times daily.   [DISCONTINUED] Lacosamide 100 MG TABS Take 1 tablet (100 mg total) by mouth in the morning and at bedtime.    [DISCONTINUED] warfarin (COUMADIN) 5 MG tablet Take 1 tablet (5 mg total) by mouth one time only at 4 PM for 15 doses. Except Fridays.   [DISCONTINUED] warfarin (COUMADIN) 5 MG tablet Take 0.5 tablets (2.5 mg total) by mouth once a week. On Fridays   warfarin (COUMADIN) 5 MG tablet Take 1 tablet (5 mg total) by mouth one time only at 4 PM for 15 doses.   [DISCONTINUED] warfarin (COUMADIN) 5 MG tablet Take 1 tablet (5 mg total) by mouth one time only at 4 PM for 15 doses.   No facility-administered encounter medications on file as of 02/16/2022.    Review of Systems:  Review of Systems  Constitutional:  Negative for appetite change, chills, fatigue and fever.  HENT:  Negative for congestion, hearing loss, rhinorrhea and sore throat.   Eyes: Negative.   Respiratory:  Negative for cough, shortness of breath and wheezing.   Cardiovascular:  Negative for chest pain, palpitations and leg swelling.  Gastrointestinal:  Negative for abdominal pain, constipation, diarrhea, nausea and vomiting.  Genitourinary:  Negative for dysuria.  Musculoskeletal:  Negative for arthralgias, back pain and myalgias.       Right great to pain since yesterday  Skin:  Negative for color change, rash and wound.  Neurological:  Positive for weakness. Negative for dizziness and headaches.  Psychiatric/Behavioral:  Negative for behavioral problems. The patient is not nervous/anxious.     Health Maintenance  Topic Date Due   Zoster Vaccines- Shingrix (1 of 2) Never done   Medicare Annual Wellness (AWV)  04/16/2017   COLONOSCOPY (Pts 45-82yr Insurance coverage will need to be confirmed)  10/21/2017   COVID-19 Vaccine (4 - 2023-24 season) 09/22/2021   DTaP/Tdap/Td (2 - Td or Tdap) 12/13/2022   MAMMOGRAM  12/12/2023   Pneumonia Vaccine 72 Years old  Completed   INFLUENZA VACCINE  Completed   DEXA SCAN  Completed   Hepatitis C Screening  Completed   HPV VACCINES  Aged Out    Physical Exam: Vitals:   02/16/22 1023   BP: 130/70  Temp: (!) 97.3 F (36.3 C)  SpO2: 99%  Height: '5\' 7"'$  (1.702 m)   Body mass index is 19.67 kg/m. Physical Exam Constitutional:      General: She is not in acute distress.    Appearance: Normal appearance.  HENT:     Head: Normocephalic and atraumatic.     Nose: Nose normal.     Mouth/Throat:     Mouth: Mucous membranes are moist.  Eyes:     Conjunctiva/sclera: Conjunctivae normal.  Cardiovascular:     Rate and Rhythm: Normal rate and regular rhythm.  Pulmonary:     Effort: Pulmonary effort is normal.     Breath sounds: Normal breath sounds.  Abdominal:     General: Bowel sounds are normal.  Palpations: Abdomen is soft.  Musculoskeletal:     Cervical back: Normal range of motion.     Comments: RUE not moving -  right great toe tenderness  Skin:    General: Skin is warm and dry.  Neurological:     Mental Status: Mental status is at baseline.     Comments: Aphasic RUE not able to move  Psychiatric:        Mood and Affect: Mood normal.        Behavior: Behavior normal.     Labs reviewed: Basic Metabolic Panel: Recent Labs    06/12/21 0935 09/12/21 1039 12/28/21 1027  NA 140 140 136  K 4.4 4.4 4.5  CL 110 108 105  CO2 '23 22 23  '$ GLUCOSE 91 88 79  BUN '23 19 22  '$ CREATININE 1.19* 1.14* 1.11*  CALCIUM 9.1 9.2 9.2   Liver Function Tests: Recent Labs    02/28/21 1038  AST 21  ALT 8  ALKPHOS 123*  BILITOT 0.3  PROT 7.5  ALBUMIN 4.6   No results for input(s): "LIPASE", "AMYLASE" in the last 8760 hours. No results for input(s): "AMMONIA" in the last 8760 hours. CBC: Recent Labs    06/12/21 0935 09/12/21 1039 12/28/21 1027  WBC 5.1 5.3 5.0  HGB 11.9* 12.3 13.2  HCT 38.1 38.1 41.0  MCV 90.7 89.6 89.7  PLT 224 259 244   Lipid Panel: Recent Labs    06/12/21 0935  CHOL 126  HDL 46  LDLCALC 67  TRIG 63  CHOLHDL 2.7   Lab Results  Component Value Date   HGBA1C 6.1 (H) 04/22/2020    Procedures since last visit: CUP PACEART  REMOTE DEVICE CHECK  Result Date: 01/30/2022 Scheduled remote reviewed. Normal device function.  1 AMS, 6sec in duration Next remote 91 days. LA   Assessment/Plan  1. Encounter for monitoring Coumadin therapy - POCT INR 1.4, sub therapeutic -  will increase Coumadin to 5 mg daily -  repeat INR in 1 week - warfarin (COUMADIN) 5 MG tablet; Take 1 tablet (5 mg total) by mouth one time only at 4 PM for 15 doses.  Dispense: 15 tablet; Refill: 0  2. History of CVA (cerebrovascular accident) -   continue Coumadin and Atorvastatin  3. Hemiplegia of dominant side as late effect following cerebrovascular disease ([place]) -   RUE not moving -  continue supportive care -  fall precautions  4. Primary hypertension -  BP 130/70, stable -  continue Metoprolol succinate  5. Great toe pain, right -  may use Biofreeze gel to right great toe QID PRN - Uric acid to rule out gout   Labs/tests ordered:  - POCT INR - Uric acid  Next appt:  in 1 week

## 2022-02-16 NOTE — Patient Instructions (Signed)
Check INR in 1 week

## 2022-02-17 LAB — URIC ACID: Uric Acid, Serum: 5.1 mg/dL (ref 2.5–7.0)

## 2022-02-22 NOTE — Progress Notes (Signed)
-    uric acid within normal.

## 2022-02-23 ENCOUNTER — Encounter: Payer: Self-pay | Admitting: Family

## 2022-02-23 ENCOUNTER — Ambulatory Visit (INDEPENDENT_AMBULATORY_CARE_PROVIDER_SITE_OTHER): Payer: Medicare HMO | Admitting: Family

## 2022-02-23 VITALS — BP 126/70 | HR 64 | Temp 97.1°F | Resp 16 | Ht 67.0 in | Wt 129.8 lb

## 2022-02-23 DIAGNOSIS — Z8673 Personal history of transient ischemic attack (TIA), and cerebral infarction without residual deficits: Secondary | ICD-10-CM

## 2022-02-23 DIAGNOSIS — L602 Onychogryphosis: Secondary | ICD-10-CM | POA: Diagnosis not present

## 2022-02-23 DIAGNOSIS — Z7901 Long term (current) use of anticoagulants: Secondary | ICD-10-CM

## 2022-02-23 DIAGNOSIS — Z5181 Encounter for therapeutic drug level monitoring: Secondary | ICD-10-CM | POA: Diagnosis not present

## 2022-02-23 DIAGNOSIS — I69959 Hemiplegia and hemiparesis following unspecified cerebrovascular disease affecting unspecified side: Secondary | ICD-10-CM

## 2022-02-23 LAB — PROTIME-INR
INR: 1.7 — ABNORMAL HIGH
Prothrombin Time: 17.7 s — ABNORMAL HIGH (ref 9.0–11.5)

## 2022-02-23 NOTE — Progress Notes (Signed)
Provider: Lakendra Helling FNP-C  [name], MD  Patient Care Team: [name], MD as PCP - General (Family Medicine) Constance Haw, MD as PCP - Electrophysiology (Cardiology) Jerline Pain, MD as PCP - Cardiology (Cardiology) Adrian Prows, MD as Attending Physician (Cardiology) Burnard Bunting, MD (Internal Medicine) Skeet Latch, MD as Attending Physician (Cardiology) Leta Baptist, Earlean Polka, MD as Consulting Physician (Neurology) Rex Kras, Claudette Stapler, RN as [place] Management  Extended Emergency Contact Information Primary Emergency Contact: [place] Sr Address: [address]          [place], [place] [address] [name] of [place] Phone: [phone] Mobile Phone: [phone] Relation: Spouse Secondary Emergency Contact: Ashley Mariner, [place] [address] [name] of [place] Phone: [phone] Relation: Son  Code Status:  Full Code  Goals of care: Advanced Directive information    05/26/2020   11:09 AM  Advanced Directives  Does Patient Have a Medical Advance Directive? No  Would patient like information on creating a medical advance directive? No - Patient declined     Chief Complaint  Patient presents with   Follow-up    1 week follow up on INR.    HPI:  Pt is a 72 y.o. female seen today for an acute visit for follow up one week to recheck INR. She was here 02/16/2022  seen  by Benson Norway NP INR was 1.4 previous was 1.5 ( 02/08/2022) goal 2.0 -3.0.unable to check INR today during visit per CMA out of supplies.will draw INR lab. She denies any signs of bleeding or missing coumadin dose.Also no changes in her diet.   Past Medical History:  Diagnosis Date   Adenomatous polyp of colon 09/2012   repeat colonoscopy in 5 years by Dr. Sharlett Iles   CHF (congestive heart failure) ([place])    EF 15-20% as of 05/28/11 (Dr [facility] summary)   CVA (cerebral infarction) 12/2007    H/O heart bypass surgery 2022   Hemiparesis ([place])    Hyperlipidemia    Hypertension    Seizures ([place])    Sickle cell anemia ([place])    Stroke ([place])    Vitamin D deficiency 2010   Past Surgical History:  Procedure Laterality Date   ABDOMINAL HYSTERECTOMY     BIV PACEMAKER INSERTION CRT-P N/A 04/29/2020   Procedure: BIV PACEMAKER INSERTION CRT-P;  Surgeon: Constance Haw, MD;  Location: [place] [facility];  Service: Cardiovascular;  Laterality: N/A;   CORONARY ARTERY BYPASS GRAFT N/A 04/25/2020   Procedure: CORONARY ARTERY BYPASS GRAFTING (CABG) TIMES TWO USING LEFT INTERNAL MAMMARY ARTERY AND RIGHT GREATER SAPHENOUS VEIN HARVESTED ENDOSCOPICALLY;  Surgeon: Melrose Nakayama, MD;  Location: [place];  Service: Open Heart Surgery;  Laterality: N/A;   FRACTURE SURGERY     HIP SURGERY     LEFT HEART CATH AND CORONARY ANGIOGRAPHY N/A 04/22/2020   Procedure: LEFT HEART CATH AND CORONARY ANGIOGRAPHY;  Surgeon: Larey Dresser, MD;  Location: [place] [facility];  Service: Cardiovascular;  Laterality: N/A;   TEE WITHOUT CARDIOVERSION  04/25/2020   Procedure: TRANSESOPHAGEAL ECHOCARDIOGRAM (TEE);  Surgeon: Melrose Nakayama, MD;  Location: [facility] OR;  Service: Open Heart Surgery;;   TEMPORARY PACEMAKER N/A 04/21/2020   Procedure: TEMPORARY PACEMAKER;  Surgeon: Larey Dresser, MD;  Location: [place] [facility];  Service: Cardiovascular;  Laterality: N/A;   TUBAL LIGATION      Allergies  Allergen Reactions  Lexiscan [Regadenoson] Other (See Comments)    Perioral tingling and throat tightness    Outpatient Encounter Medications as of 02/23/2022  Medication Sig   acetaminophen (TYLENOL) 325 MG tablet Take 650 mg by mouth 2 (two) times a week. As needed   atorvastatin (LIPITOR) 80 MG tablet Take 1 tablet (80 mg total) by mouth at bedtime.   Cholecalciferol 50 MCG (2000 UT) TABS Take 1 tablet by mouth daily in the afternoon.   empagliflozin (JARDIANCE) 10 MG TABS tablet Take 1 tablet (10 mg total)  by mouth daily.   furosemide (LASIX) 20 MG tablet TAKE 1 TABLET(20 MG) BY MOUTH EVERY OTHER DAY   levETIRAcetam (KEPPRA) 750 MG tablet Take 2 tablets (1,500 mg total) by mouth 2 (two) times daily.   metoprolol succinate (TOPROL-XL) 50 MG 24 hr tablet Take 1 tablet (50 mg total) by mouth daily. Take with or immediately following a meal.   Nutritional Supplements (NUTRITIONAL SUPPLEMENT PO) Take 120 mLs by mouth once. Medpass for weight gain.   sacubitril-valsartan (ENTRESTO) 97-103 MG Take 1 tablet by mouth 2 (two) times daily.   spironolactone (ALDACTONE) 25 MG tablet TAKE 1 TABLET(25 MG) BY MOUTH DAILY   VIMPAT 100 MG TABS Take 1 tablet (100 mg total) by mouth 2 (two) times daily.   warfarin (COUMADIN) 5 MG tablet Take 1 tablet (5 mg total) by mouth one time only at 4 PM for 15 doses.   No facility-administered encounter medications on file as of 02/23/2022.    Review of Systems  Constitutional:  Negative for appetite change, chills, fatigue, fever and unexpected weight change.  HENT:  Negative for congestion, ear discharge, ear pain, hearing loss, nosebleeds, postnasal drip, rhinorrhea, sinus pressure, sinus pain, sneezing, sore throat and tinnitus.   Eyes:  Negative for pain, discharge, redness, itching and visual disturbance.  Respiratory:  Negative for cough, chest tightness, shortness of breath and wheezing.   Cardiovascular:  Negative for chest pain, palpitations and leg swelling.  Gastrointestinal:  Negative for abdominal distention, abdominal pain, blood in stool, constipation, diarrhea, nausea and vomiting.  Genitourinary:  Negative for difficulty urinating, dysuria, flank pain, frequency, hematuria and urgency.  Skin:  Negative for color change, pallor, rash and wound.  Neurological:  Negative for dizziness, light-headedness and headaches.       Aphasic  RUE weakness   Hematological:  Does not bruise/bleed easily.    Immunization History  Administered Date(s) Administered    Fluad Quad(high Dose 65+) 10/10/2018, 10/26/2020, 11/29/2021   Influenza,inj,Quad PF,6+ Mos 12/12/2012, 10/23/2013, 09/30/2014, 04/05/2016   PFIZER Comirnaty(Gray Top)Covid-19 Tri-Sucrose Vaccine 05/04/2020   PFIZER(Purple Top)SARS-COV-2 Vaccination 03/22/2019, 04/15/2019   Pneumococcal Conjugate-13 09/30/2014   Pneumococcal Polysaccharide-23 04/05/2016   Tdap 12/12/2012   Pertinent  Health Maintenance Due  Topic Date Due   COLONOSCOPY (Pts 45-23yr Insurance coverage will need to be confirmed)  10/21/2017   MAMMOGRAM  12/12/2023   INFLUENZA VACCINE  Completed   DEXA SCAN  Completed      05/05/2020    8:00 AM 05/05/2020    8:15 AM 05/05/2020    7:27 PM 08/09/2020    5:23 PM 10/26/2020   10:10 AM  Fall Risk  Falls in the past year?     0  Was there an injury with Fall?     0  Fall Risk Category Calculator     0  Fall Risk Category (Retired)     Low  (RETIRED) Patient Fall Risk Level High fall risk High fall risk High  fall risk Moderate fall risk Moderate fall risk  Patient at Risk for Falls Due to     History of fall(s)  Fall risk Follow up     Falls evaluation completed;Education provided;Falls prevention discussed   Functional Status Survey:    Vitals:   02/23/22 1037  BP: 126/70  Pulse: 64  Resp: 16  Temp: (!) 97.1 F (36.2 C)  SpO2: 92%  Weight: 129 lb 12.8 oz (58.9 kg)  Height: 5' 7"$  (1.702 m)   Body mass index is 20.33 kg/m. Physical Exam Vitals reviewed.  Constitutional:      General: She is not in acute distress.    Appearance: Normal appearance. She is normal weight. She is not ill-appearing or diaphoretic.  HENT:     Head: Normocephalic.     Nose: Nose normal. No congestion or rhinorrhea.     Mouth/Throat:     Mouth: Mucous membranes are moist.     Pharynx: Oropharynx is clear. No oropharyngeal exudate or posterior oropharyngeal erythema.  Eyes:     General: No scleral icterus.       Right eye: No discharge.        Left eye: No discharge.      Conjunctiva/sclera: Conjunctivae normal.     Pupils: Pupils are equal, round, and reactive to light.  Neck:     Vascular: No carotid bruit.  Cardiovascular:     Rate and Rhythm: Normal rate and regular rhythm.     Pulses: Normal pulses.     Heart sounds: Normal heart sounds. No murmur heard.    No friction rub. No gallop.  Pulmonary:     Effort: Pulmonary effort is normal. No respiratory distress.     Breath sounds: Normal breath sounds. No wheezing, rhonchi or rales.  Chest:     Chest wall: No tenderness.  Abdominal:     General: Bowel sounds are normal. There is no distension.     Palpations: Abdomen is soft. There is no mass.     Tenderness: There is no abdominal tenderness. There is no right CVA tenderness, left CVA tenderness, guarding or rebound.  Musculoskeletal:     Cervical back: Normal range of motion. No rigidity or tenderness.  Lymphadenopathy:     Cervical: No cervical adenopathy.  Skin:    General: Skin is warm and dry.     Coloration: Skin is not pale.     Findings: No bruising, erythema, lesion or rash.  Neurological:     Mental Status: She is alert and oriented to person, place, and time.     Cranial Nerves: No cranial nerve deficit.     Coordination: Coordination normal.     Gait: Gait abnormal.     Comments: RUE weakness   Psychiatric:        Mood and Affect: Mood normal.        Speech: Speech normal.        Behavior: Behavior normal.    Labs reviewed: Recent Labs    06/12/21 0935 09/12/21 1039 12/28/21 1027  NA 140 140 136  K 4.4 4.4 4.5  CL 110 108 105  CO2 23 22 23  $ GLUCOSE 91 88 79  BUN 23 19 22  $ CREATININE 1.19* 1.14* 1.11*  CALCIUM 9.1 9.2 9.2   Recent Labs    02/28/21 1038  AST 21  ALT 8  ALKPHOS 123*  BILITOT 0.3  PROT 7.5  ALBUMIN 4.6   Recent Labs    06/12/21 0935 09/12/21 1039 12/28/21  1027  WBC 5.1 5.3 5.0  HGB 11.9* 12.3 13.2  HCT 38.1 38.1 41.0  MCV 90.7 89.6 89.7  PLT 224 259 244   Lab Results  Component  Value Date   TSH 1.846 10/23/2013   Lab Results  Component Value Date   HGBA1C 6.1 (H) 04/22/2020   Lab Results  Component Value Date   CHOL 126 06/12/2021   HDL 46 06/12/2021   LDLCALC 67 06/12/2021   TRIG 63 06/12/2021   CHOLHDL 2.7 06/12/2021    Significant Diagnostic Results in last 30 days:  CUP PACEART REMOTE DEVICE CHECK  Result Date: 01/30/2022 Scheduled remote reviewed. Normal device function.  1 AMS, 6sec in duration Next remote 91 days. LA   Assessment/Plan 1. Encounter for monitoring Coumadin therapy Unable to check INR at the office.labs drawn and send.made aware will call with results. - Protime-INR  2. History of CVA (cerebrovascular accident) - continue on atorvastatin and warfarin  - Protime-INR  3. Hemiplegia of dominant side as late effect following cerebrovascular disease ([facility]) RUE weakness  - fall and safety precaution   4. Overgrown toenails Long thick toenails.will refer to podiatrist for trimming unable to trim due to RUE weakness from hx of CVA.   - Ambulatory referral to Podiatry  Family/ staff Communication: Reviewed plan of care with patient and POA verbalized understanding  Labs/tests ordered:  - Protime-INR   Next Appointment: Return if symptoms worsen or fail to improve.   Sandrea Hughs, NP

## 2022-03-01 ENCOUNTER — Other Ambulatory Visit: Payer: Self-pay | Admitting: Adult Health

## 2022-03-01 DIAGNOSIS — Z7901 Long term (current) use of anticoagulants: Secondary | ICD-10-CM

## 2022-03-02 ENCOUNTER — Other Ambulatory Visit: Payer: Self-pay

## 2022-03-02 DIAGNOSIS — Z5181 Encounter for therapeutic drug level monitoring: Secondary | ICD-10-CM

## 2022-03-02 MED ORDER — WARFARIN SODIUM 5 MG PO TABS: ORAL_TABLET | ORAL | 0 refills | Status: DC

## 2022-03-07 ENCOUNTER — Encounter: Payer: Self-pay | Admitting: Podiatry

## 2022-03-07 ENCOUNTER — Ambulatory Visit: Payer: Medicare HMO | Admitting: Podiatry

## 2022-03-07 DIAGNOSIS — M79674 Pain in right toe(s): Secondary | ICD-10-CM

## 2022-03-07 DIAGNOSIS — B351 Tinea unguium: Secondary | ICD-10-CM

## 2022-03-07 DIAGNOSIS — M79675 Pain in left toe(s): Secondary | ICD-10-CM

## 2022-03-07 NOTE — Progress Notes (Signed)
  Subjective:  Patient ID: [name], female    DOB: Jun 04, 1950,  MRN: [id number]  Chief Complaint  Patient presents with   Nail Problem    Nail trim    72 y.o. female returns for the above complaint.  Patient presents with thickened and again dystrophic mycotic toenails x 10.  Mild pain on palpation and she is not able to debride down herself she would like me to do it.  Denies any other acute complaints.  Objective:  There were no vitals filed for this visit. Podiatric Exam: Vascular: dorsalis pedis and posterior tibial pulses are palpable bilateral. Capillary return is immediate. Temperature gradient is WNL. Skin turgor WNL  Sensorium: Normal Semmes Weinstein monofilament test. Normal tactile sensation bilaterally. Nail Exam: Pt has thick disfigured discolored nails with subungual debris noted bilateral entire nail hallux through fifth toenails.  Pain on palpation to the nails. Ulcer Exam: There is no evidence of ulcer or pre-ulcerative changes or infection. Orthopedic Exam: Muscle tone and strength are WNL. No limitations in general ROM. No crepitus or effusions noted.  Skin: No Porokeratosis. No infection or ulcers    Assessment & Plan:   1. Pain due to onychomycosis of toenails of both feet     Patient was evaluated and treated and all questions answered.  Onychomycosis with pain  -Nails palliatively debrided as below. -Educated on self-care  Procedure: Nail Debridement Rationale: pain  Type of Debridement: manual, sharp debridement. Instrumentation: Nail nipper, rotary burr. Number of Nails: 10  Procedures and Treatment: Consent by patient was obtained for treatment procedures. The patient understood the discussion of treatment and procedures well. All questions were answered thoroughly reviewed. Debridement of mycotic and hypertrophic toenails, 1 through 5 bilateral and clearing of subungual debris. No ulceration, no infection noted.  Return Visit-Office  Procedure: Patient instructed to return to the office for a follow up visit 3 months for continued evaluation and treatment.  Boneta Lucks, DPM    Return in about 3 months (around 06/05/2022).

## 2022-03-08 ENCOUNTER — Encounter: Payer: Self-pay | Admitting: Family

## 2022-03-08 ENCOUNTER — Ambulatory Visit (INDEPENDENT_AMBULATORY_CARE_PROVIDER_SITE_OTHER): Payer: Medicare HMO | Admitting: Family

## 2022-03-08 VITALS — BP 136/88 | HR 67 | Temp 97.5°F | Resp 16 | Ht 67.0 in | Wt 130.8 lb

## 2022-03-08 DIAGNOSIS — Z8673 Personal history of transient ischemic attack (TIA), and cerebral infarction without residual deficits: Secondary | ICD-10-CM | POA: Diagnosis not present

## 2022-03-08 DIAGNOSIS — Z5181 Encounter for therapeutic drug level monitoring: Secondary | ICD-10-CM | POA: Diagnosis not present

## 2022-03-08 DIAGNOSIS — Z7901 Long term (current) use of anticoagulants: Secondary | ICD-10-CM | POA: Diagnosis not present

## 2022-03-08 LAB — POCT INR: INR: 3.3 — AB (ref 2.0–3.0)

## 2022-03-08 MED ORDER — WARFARIN SODIUM 5 MG PO TABS: 5.0000 mg | ORAL_TABLET | Freq: Every day | ORAL | 1 refills | Status: DC

## 2022-03-08 MED ORDER — WARFARIN SODIUM 2 MG PO TABS: 2.0000 mg | ORAL_TABLET | Freq: Every day | ORAL | 3 refills | Status: DC

## 2022-03-08 NOTE — Progress Notes (Signed)
Provider: Caron Ode FNP-C  [name], MD  Patient Care Team: [name], MD as PCP - General (Family Medicine) Constance Haw, MD as PCP - Electrophysiology (Cardiology) Jerline Pain, MD as PCP - Cardiology (Cardiology) Adrian Prows, MD as Attending Physician (Cardiology) Burnard Bunting, MD (Internal Medicine) Skeet Latch, MD as Attending Physician (Cardiology) Leta Baptist, Earlean Polka, MD as Consulting Physician (Neurology) Rex Kras, Claudette Stapler, RN as [place] Management  Extended Emergency Contact Information Primary Emergency Contact: [place] Sr Address: [address]          [place], [place] [address] [name] of [place] Phone: [phone] Mobile Phone: [phone] Relation: Spouse Secondary Emergency Contact: Ashley Mariner, [place] [address] [name] of [place] Phone: [phone] Relation: Son  Code Status:  Full Code  Goals of care: Advanced Directive information    03/08/2022   11:00 AM  Advanced Directives  Does Patient Have a Medical Advance Directive? No  Would patient like information on creating a medical advance directive? No - Patient declined     Chief Complaint  Patient presents with   Follow-up    Patient here for INR check. Patient spouse denies patient missing any doses.     HPI:  Pt is a 72 y.o. female seen today for an acute visit for INR check.she was here on 02/23/2022 blood work drawn for INR was 1.7 was advised to increase coumadin from 5 mg tablet to 5.5 mg tablet daily. Husband states has been giving 7.5 mg tablet daily.She reports no bleeding.No changes in diet.Taking medication as directed. She was referred to podiatrist on last visit.States had her toenails trimmed.   Past Medical History:  Diagnosis Date   Adenomatous polyp of colon 09/2012   repeat colonoscopy in 5 years by Dr. Sharlett Iles   CHF (congestive heart failure) ([facility])    EF 15-20% as of  05/28/11 (Dr [facility])   CVA (cerebral infarction) 12/2007   H/O heart bypass surgery 2022   Hemiparesis ([facility])    Hyperlipidemia    Hypertension    Seizures ([facility])    Sickle cell anemia ([facility])    Stroke ([place])    Vitamin D deficiency 2010   Past Surgical History:  Procedure Laterality Date   ABDOMINAL HYSTERECTOMY     BIV PACEMAKER INSERTION CRT-P N/A 04/29/2020   Procedure: BIV PACEMAKER INSERTION CRT-P;  Surgeon: Constance Haw, MD;  Location: [facility];  Service: Cardiovascular;  Laterality: N/A;   CORONARY ARTERY BYPASS GRAFT N/A 04/25/2020   Procedure: CORONARY ARTERY BYPASS GRAFTING (CABG) TIMES TWO USING LEFT INTERNAL MAMMARY ARTERY AND RIGHT GREATER SAPHENOUS VEIN HARVESTED ENDOSCOPICALLY;  Surgeon: Melrose Nakayama, MD;  Location: [facility];  Service: Open Heart Surgery;  Laterality: N/A;   FRACTURE SURGERY     HIP SURGERY     LEFT HEART CATH AND CORONARY ANGIOGRAPHY N/A 04/22/2020   Procedure: LEFT HEART CATH AND CORONARY ANGIOGRAPHY;  Surgeon: Larey Dresser, MD;  Location: [facility];  Service: Cardiovascular;  Laterality: N/A;   TEE WITHOUT CARDIOVERSION  04/25/2020   Procedure: TRANSESOPHAGEAL ECHOCARDIOGRAM (TEE);  Surgeon: Melrose Nakayama, MD;  Location: [facility];  Service: Open Heart Surgery;;   TEMPORARY PACEMAKER N/A 04/21/2020   Procedure: TEMPORARY PACEMAKER;  Surgeon: Larey Dresser, MD;  Location: [facility];  Service: Cardiovascular;  Laterality: N/A;   TUBAL LIGATION  Allergies  Allergen Reactions   Lexiscan [Regadenoson] Other (See Comments)    Perioral tingling and throat tightness    Outpatient Encounter Medications as of 03/08/2022  Medication Sig   acetaminophen (TYLENOL) 325 MG tablet Take 650 mg by mouth 2 (two) times a week. As needed   atorvastatin (LIPITOR) 80 MG tablet Take 1 tablet (80 mg total) by mouth at bedtime.   Cholecalciferol 50 MCG (2000 UT) TABS Take 1 tablet by mouth daily in  the afternoon.   empagliflozin (JARDIANCE) 10 MG TABS tablet Take 1 tablet (10 mg total) by mouth daily.   furosemide (LASIX) 20 MG tablet TAKE 1 TABLET(20 MG) BY MOUTH EVERY OTHER DAY   levETIRAcetam (KEPPRA) 750 MG tablet Take 2 tablets (1,500 mg total) by mouth 2 (two) times daily.   metoprolol succinate (TOPROL-XL) 50 MG 24 hr tablet Take 1 tablet (50 mg total) by mouth daily. Take with or immediately following a meal.   Nutritional Supplements (NUTRITIONAL SUPPLEMENT PO) Take 120 mLs by mouth once. Medpass for weight gain.   sacubitril-valsartan (ENTRESTO) 97-103 MG Take 1 tablet by mouth 2 (two) times daily.   spironolactone (ALDACTONE) 25 MG tablet TAKE 1 TABLET(25 MG) BY MOUTH DAILY   VIMPAT 100 MG TABS Take 1 tablet (100 mg total) by mouth 2 (two) times daily.   warfarin (COUMADIN) 5 MG tablet Take 7.5 mg by mouth daily.   [DISCONTINUED] warfarin (COUMADIN) 5 MG tablet TAKE 1 TABLET(5 MG) BY MOUTH 1 TIME ONLY AT 4 PM FOR 15 DOSES   No facility-administered encounter medications on file as of 03/08/2022.    Review of Systems  Constitutional:  Negative for appetite change, chills, fatigue, fever and unexpected weight change.  HENT:  Negative for congestion, dental problem, ear discharge, ear pain, facial swelling, hearing loss, nosebleeds, postnasal drip, rhinorrhea, sinus pressure, sinus pain, sneezing, sore throat, tinnitus and trouble swallowing.   Eyes:  Negative for pain, discharge, redness, itching and visual disturbance.  Respiratory:  Negative for cough, chest tightness, shortness of breath and wheezing.   Cardiovascular:  Negative for chest pain, palpitations and leg swelling.  Gastrointestinal:  Negative for abdominal distention, abdominal pain, blood in stool, constipation, diarrhea, nausea and vomiting.  Genitourinary:  Negative for difficulty urinating, dysuria, flank pain, frequency and urgency.  Musculoskeletal:  Positive for gait problem. Negative for arthralgias, back  pain and joint swelling.  Skin:  Negative for color change, pallor and rash.  Neurological:  Positive for speech difficulty and weakness. Negative for dizziness, syncope, light-headedness and headaches.       Aphasia   Hematological:  Does not bruise/bleed easily.  Psychiatric/Behavioral:  Negative for agitation, behavioral problems, confusion and hallucinations. The patient is not nervous/anxious.     Immunization History  Administered Date(s) Administered   Fluad Quad(high Dose 65+) 10/10/2018, 10/26/2020, 11/29/2021   Influenza,inj,Quad PF,6+ Mos 12/12/2012, 10/23/2013, 09/30/2014, 04/05/2016   PFIZER Comirnaty(Gray Top)Covid-19 Tri-Sucrose Vaccine 05/04/2020   PFIZER(Purple Top)SARS-COV-2 Vaccination 03/22/2019, 04/15/2019   Pneumococcal Conjugate-13 09/30/2014   Pneumococcal Polysaccharide-23 04/05/2016   Tdap 12/12/2012   Pertinent  Health Maintenance Due  Topic Date Due   COLONOSCOPY (Pts 45-60yr Insurance coverage will need to be confirmed)  10/21/2017   MAMMOGRAM  12/12/2023   INFLUENZA VACCINE  Completed   DEXA SCAN  Completed      05/05/2020    8:15 AM 05/05/2020    7:27 PM 08/09/2020    5:23 PM 10/26/2020   10:10 AM 03/08/2022   11:00 AM  Fall  Risk  Falls in the past year?    0 0  Was there an injury with Fall?    0 0  Fall Risk Category Calculator    0 0  Fall Risk Category (Retired)    Low   (RETIRED) Patient Fall Risk Level High fall risk High fall risk Moderate fall risk Moderate fall risk   Patient at Risk for Falls Due to    History of fall(s) No Fall Risks  Fall risk Follow up    Falls evaluation completed;Education provided;Falls prevention discussed Falls evaluation completed   Functional Status Survey:    Vitals:   03/08/22 1055  BP: 136/88  Pulse: 67  Resp: 16  Temp: (!) 97.5 F (36.4 C)  SpO2: 97%  Weight: 130 lb 12.8 oz (59.3 kg)  Height: 5' 7"$  (1.702 m)   Body mass index is 20.49 kg/m. Physical Exam Vitals reviewed.  Constitutional:       General: She is not in acute distress.    Appearance: Normal appearance. She is normal weight. She is not ill-appearing or diaphoretic.  HENT:     Head: Normocephalic.     Nose: Nose normal. No congestion or rhinorrhea.     Mouth/Throat:     Mouth: Mucous membranes are moist.     Pharynx: Oropharynx is clear. No oropharyngeal exudate or posterior oropharyngeal erythema.  Eyes:     General: No scleral icterus.       Right eye: No discharge.        Left eye: No discharge.     Conjunctiva/sclera: Conjunctivae normal.     Pupils: Pupils are equal, round, and reactive to light.  Neck:     Vascular: No carotid bruit.  Cardiovascular:     Rate and Rhythm: Normal rate and regular rhythm.     Pulses: Normal pulses.     Heart sounds: Normal heart sounds. No murmur heard.    No friction rub. No gallop.  Pulmonary:     Effort: Pulmonary effort is normal. No respiratory distress.     Breath sounds: Normal breath sounds. No wheezing, rhonchi or rales.  Chest:     Chest wall: No tenderness.  Abdominal:     General: Bowel sounds are normal. There is no distension.     Palpations: Abdomen is soft. There is no mass.     Tenderness: There is no abdominal tenderness. There is no right CVA tenderness, left CVA tenderness, guarding or rebound.  Musculoskeletal:        General: No swelling or tenderness.     Cervical back: Normal range of motion. No rigidity or tenderness.     Right lower leg: No edema.     Left lower leg: No edema.     Comments: Right hand brace in place   Lymphadenopathy:     Cervical: No cervical adenopathy.  Skin:    General: Skin is warm and dry.     Coloration: Skin is not pale.     Findings: No bruising, erythema, lesion or rash.  Neurological:     Mental Status: She is alert. Mental status is at baseline.     Sensory: No sensory deficit.     Motor: Weakness present.     Coordination: Coordination normal.     Gait: Gait abnormal.     Comments: Right side weakness    Psychiatric:        Mood and Affect: Mood normal.        Speech: Speech normal.  Behavior: Behavior normal.     Labs reviewed: Recent Labs    06/12/21 0935 09/12/21 1039 12/28/21 1027  NA 140 140 136  K 4.4 4.4 4.5  CL 110 108 105  CO2 23 22 23  $ GLUCOSE 91 88 79  BUN 23 19 22  $ CREATININE 1.19* 1.14* 1.11*  CALCIUM 9.1 9.2 9.2   No results for input(s): "AST", "ALT", "ALKPHOS", "BILITOT", "PROT", "ALBUMIN" in the last 8760 hours. Recent Labs    06/12/21 0935 09/12/21 1039 12/28/21 1027  WBC 5.1 5.3 5.0  HGB 11.9* 12.3 13.2  HCT 38.1 38.1 41.0  MCV 90.7 89.6 89.7  PLT 224 259 244   Lab Results  Component Value Date   TSH 1.846 10/23/2013   Lab Results  Component Value Date   HGBA1C 6.1 (H) 04/22/2020   Lab Results  Component Value Date   CHOL 126 06/12/2021   HDL 46 06/12/2021   LDLCALC 67 06/12/2021   TRIG 63 06/12/2021   CHOLHDL 2.7 06/12/2021    Significant Diagnostic Results in last 30 days:  No results found.  Assessment/Plan  1. Encounter for monitoring Coumadin therapy INR 3.3 today goal 2.0 -3.0 for Hx of CVA She was advised to take 5.5 on previous visit but Husband has been giving Coumadin 7.5 mg tablet daily.No signs of bleeding or skipping dose. - advised to hold coumadin tonight then restart coumadin at 7 mg tablet daily on Friday 03/09/2022.script send for 5 mg tablet and 2 mg tablet daily (total 7 mg ) Husband verbalized understanding and repeated back dosage.  - will recheck INR in 2 weeks. - Advised to notify provider for any signs of bleeding. - POC INR  2. History of CVA (cerebrovascular accident) Continue on coumadin as above. Continue on Atorvastatin. Continue with supportive care.    Family/ staff Communication: Reviewed plan of care with patient verbalized understanding  Labs/tests ordered:  - POC INR  Next Appointment: Return in about 2 weeks (around 03/22/2022) for INR check .   Sandrea Hughs, NP

## 2022-03-12 NOTE — Progress Notes (Signed)
Remote pacemaker transmission.   

## 2022-03-22 ENCOUNTER — Encounter: Payer: Self-pay | Admitting: Family

## 2022-03-22 ENCOUNTER — Ambulatory Visit (INDEPENDENT_AMBULATORY_CARE_PROVIDER_SITE_OTHER): Payer: Medicare HMO | Admitting: Family

## 2022-03-22 VITALS — BP 118/74 | HR 68 | Temp 97.4°F | Ht 67.0 in | Wt 130.0 lb

## 2022-03-22 DIAGNOSIS — Z8673 Personal history of transient ischemic attack (TIA), and cerebral infarction without residual deficits: Secondary | ICD-10-CM | POA: Diagnosis not present

## 2022-03-22 DIAGNOSIS — B3789 Other sites of candidiasis: Secondary | ICD-10-CM | POA: Diagnosis not present

## 2022-03-22 DIAGNOSIS — Z5181 Encounter for therapeutic drug level monitoring: Secondary | ICD-10-CM

## 2022-03-22 DIAGNOSIS — Z7901 Long term (current) use of anticoagulants: Secondary | ICD-10-CM | POA: Diagnosis not present

## 2022-03-22 LAB — POCT INR: INR: 3.8 — AB (ref 2.0–3.0)

## 2022-03-22 MED ORDER — WARFARIN SODIUM 1 MG PO TABS: 1.0000 mg | ORAL_TABLET | Freq: Every day | ORAL | 3 refills | Status: DC

## 2022-03-22 MED ORDER — NYSTATIN 100000 UNIT/GM EX CREA: 1.0000 | TOPICAL_CREAM | Freq: Two times a day (BID) | CUTANEOUS | 0 refills | Status: DC

## 2022-03-22 NOTE — Progress Notes (Signed)
Provider: Lilymarie Scroggins FNP-C  [name], MD  Patient Care Team: [name], MD as PCP - General (Family Medicine) Constance Haw, MD as PCP - Electrophysiology (Cardiology) Jerline Pain, MD as PCP - Cardiology (Cardiology) Adrian Prows, MD as Attending Physician (Cardiology) Burnard Bunting, MD (Internal Medicine) Skeet Latch, MD as Attending Physician (Cardiology) Leta Baptist, Earlean Polka, MD as Consulting Physician (Neurology) Rex Kras, Claudette Stapler, RN as [facility] Management  Extended Emergency Contact Information Primary Emergency Contact: [place] Sr Address: [address]          [place], [place] [address] [name] of [place] Phone: [phone] Mobile Phone: [phone] Relation: Spouse Secondary Emergency Contact: Ashley Mariner, [place] [address] [name] of [place] Phone: [phone] Relation: Son  Code Status:  Full Code  Goals of care: Advanced Directive information    03/08/2022   11:00 AM  Advanced Directives  Does Patient Have a Medical Advance Directive? No  Would patient like information on creating a medical advance directive? No - Patient declined     Chief Complaint  Patient presents with   Anticoagulation    2 week PT/INR check. 3.8 today. No missed doses, no diet changes, and no unusual bleeding.    Acute Visit    Rash on chest area x couple of weeks.     HPI:  Pt is a 72 y.o. female seen today for an acute visit for evaluation of INR check.Had INR checked 03/08/2022 INR was 3.3 goal 2.0 - 3.0 for hx of CVA  She denies any signs of bleeding. Has not missed any dosage.    Past Medical History:  Diagnosis Date   Adenomatous polyp of colon 09/2012   repeat colonoscopy in 5 years by Dr. Sharlett Iles   CHF (congestive heart failure) ([facility])    EF 15-20% as of 05/28/11 (Dr [facility] summary)   CVA (cerebral infarction) 12/2007   H/O heart bypass surgery 2022    Hemiparesis ([facility])    Hyperlipidemia    Hypertension    Seizures ([place] [facility])    Sickle cell anemia ([facility])    Stroke ([facility])    Vitamin D deficiency 2010   Past Surgical History:  Procedure Laterality Date   ABDOMINAL HYSTERECTOMY     BIV PACEMAKER INSERTION CRT-P N/A 04/29/2020   Procedure: BIV PACEMAKER INSERTION CRT-P;  Surgeon: Constance Haw, MD;  Location: [facility];  Service: Cardiovascular;  Laterality: N/A;   CORONARY ARTERY BYPASS GRAFT N/A 04/25/2020   Procedure: CORONARY ARTERY BYPASS GRAFTING (CABG) TIMES TWO USING LEFT INTERNAL MAMMARY ARTERY AND RIGHT GREATER SAPHENOUS VEIN HARVESTED ENDOSCOPICALLY;  Surgeon: Melrose Nakayama, MD;  Location: [facility];  Service: Open Heart Surgery;  Laterality: N/A;   FRACTURE SURGERY     HIP SURGERY     LEFT HEART CATH AND CORONARY ANGIOGRAPHY N/A 04/22/2020   Procedure: LEFT HEART CATH AND CORONARY ANGIOGRAPHY;  Surgeon: Larey Dresser, MD;  Location: [facility];  Service: Cardiovascular;  Laterality: N/A;   TEE WITHOUT CARDIOVERSION  04/25/2020   Procedure: TRANSESOPHAGEAL ECHOCARDIOGRAM (TEE);  Surgeon: Melrose Nakayama, MD;  Location: [facility];  Service: Open Heart Surgery;;   TEMPORARY PACEMAKER N/A 04/21/2020   Procedure: TEMPORARY PACEMAKER;  Surgeon: Larey Dresser, MD;  Location: [facility];  Service: Cardiovascular;  Laterality: N/A;   TUBAL LIGATION      Allergies  Allergen Reactions  Lexiscan [Regadenoson] Other (See Comments)    Perioral tingling and throat tightness    Outpatient Encounter Medications as of 03/22/2022  Medication Sig   acetaminophen (TYLENOL) 325 MG tablet Take 650 mg by mouth 2 (two) times a week. As needed   atorvastatin (LIPITOR) 80 MG tablet Take 1 tablet (80 mg total) by mouth at bedtime.   Cholecalciferol 50 MCG (2000 UT) TABS Take 1 tablet by mouth daily in the afternoon.   empagliflozin (JARDIANCE) 10 MG TABS tablet Take 1 tablet (10 mg total) by mouth daily.    furosemide (LASIX) 20 MG tablet TAKE 1 TABLET(20 MG) BY MOUTH EVERY OTHER DAY   levETIRAcetam (KEPPRA) 750 MG tablet Take 2 tablets (1,500 mg total) by mouth 2 (two) times daily.   metoprolol succinate (TOPROL-XL) 50 MG 24 hr tablet Take 1 tablet (50 mg total) by mouth daily. Take with or immediately following a meal.   Nutritional Supplements (NUTRITIONAL SUPPLEMENT PO) Take 120 mLs by mouth once. Medpass for weight gain.   sacubitril-valsartan (ENTRESTO) 97-103 MG Take 1 tablet by mouth 2 (two) times daily.   spironolactone (ALDACTONE) 25 MG tablet TAKE 1 TABLET(25 MG) BY MOUTH DAILY   VIMPAT 100 MG TABS Take 1 tablet (100 mg total) by mouth 2 (two) times daily.   warfarin (COUMADIN) 2 MG tablet Take 1 tablet (2 mg total) by mouth daily.   warfarin (COUMADIN) 5 MG tablet Take 1 tablet (5 mg total) by mouth daily.   No facility-administered encounter medications on file as of 03/22/2022.    Review of Systems  Unable to perform ROS: Dementia (Additional information provided by patient's husband)  Constitutional:  Negative for appetite change, chills, fatigue, fever and unexpected weight change.  Eyes:  Negative for pain, discharge, redness, itching and visual disturbance.  Respiratory:  Negative for cough, chest tightness, shortness of breath and wheezing.   Cardiovascular:  Negative for chest pain, palpitations and leg swelling.  Gastrointestinal:  Negative for abdominal distention, abdominal pain, constipation, diarrhea, nausea and vomiting.  Genitourinary:  Negative for difficulty urinating, dysuria, flank pain, frequency and urgency.       Incontinent for bladder  Musculoskeletal:  Positive for gait problem. Negative for arthralgias, back pain, joint swelling, myalgias, neck pain and neck stiffness.  Skin:  Negative for color change, pallor, rash and wound.       Breast fold skin redness  Neurological:  Negative for dizziness, syncope, light-headedness and headaches.       Right-sided  weakness and aphasia  Hematological:  Does not bruise/bleed easily.    Immunization History  Administered Date(s) Administered   Fluad Quad(high Dose 65+) 10/10/2018, 10/26/2020, 11/29/2021   Influenza,inj,Quad PF,6+ Mos 12/12/2012, 10/23/2013, 09/30/2014, 04/05/2016   PFIZER Comirnaty(Gray Top)Covid-19 Tri-Sucrose Vaccine 05/04/2020   PFIZER(Purple Top)SARS-COV-2 Vaccination 03/22/2019, 04/15/2019   Pneumococcal Conjugate-13 09/30/2014   Pneumococcal Polysaccharide-23 04/05/2016   Tdap 12/12/2012   Pertinent  Health Maintenance Due  Topic Date Due   COLONOSCOPY (Pts 45-72yr Insurance coverage will need to be confirmed)  10/21/2017   MAMMOGRAM  12/12/2023   INFLUENZA VACCINE  Completed   DEXA SCAN  Completed      05/05/2020    8:15 AM 05/05/2020    7:27 PM 08/09/2020    5:23 PM 10/26/2020   10:10 AM 03/08/2022   11:00 AM  Fall Risk  Falls in the past year?    0 0  Was there an injury with Fall?    0 0  Fall Risk Category Calculator  0 0  Fall Risk Category (Retired)    Low   (RETIRED) Patient Fall Risk Level High fall risk High fall risk Moderate fall risk Moderate fall risk   Patient at Risk for Falls Due to    History of fall(s) No Fall Risks  Fall risk Follow up    Falls evaluation completed;Education provided;Falls prevention discussed Falls evaluation completed   Functional Status Survey:    Vitals:   03/22/22 1011  BP: 118/74  Pulse: 68  Temp: (!) 97.4 F (36.3 C)  TempSrc: Temporal  SpO2: 99%  Weight: 130 lb (59 kg)  Height: '5\' 7"'$  (1.702 m)   Body mass index is 20.36 kg/m. Physical Exam Neurological:     Motor: Weakness present.     Gait: Gait abnormal.     Comments: Right-sided hemiplegia     Labs reviewed: Recent Labs    06/12/21 0935 09/12/21 1039 12/28/21 1027  NA 140 140 136  K 4.4 4.4 4.5  CL 110 108 105  CO2 '23 22 23  '$ GLUCOSE 91 88 79  BUN '23 19 22  '$ CREATININE 1.19* 1.14* 1.11*  CALCIUM 9.1 9.2 9.2   No results for input(s):  "AST", "ALT", "ALKPHOS", "BILITOT", "PROT", "ALBUMIN" in the last 8760 hours. Recent Labs    06/12/21 0935 09/12/21 1039 12/28/21 1027  WBC 5.1 5.3 5.0  HGB 11.9* 12.3 13.2  HCT 38.1 38.1 41.0  MCV 90.7 89.6 89.7  PLT 224 259 244   Lab Results  Component Value Date   TSH 1.846 10/23/2013   Lab Results  Component Value Date   HGBA1C 6.1 (H) 04/22/2020   Lab Results  Component Value Date   CHOL 126 06/12/2021   HDL 46 06/12/2021   LDLCALC 67 06/12/2021   TRIG 63 06/12/2021   CHOLHDL 2.7 06/12/2021    Significant Diagnostic Results in last 30 days:  No results found.  Assessment/Plan  1. Encounter for monitoring Coumadin therapy - POC INR 3.8 goal 2.0 -3.0  - No missed dosage or signs of bleeding  - Advised to skip dose x 1 today  then restart coumadin tomorrow at 6 mg tablet daily.will reduce 2 mg tablet to 1 mg tablet to take along with 6 mg tablet. - warfarin (COUMADIN) 1 MG tablet; Take 1 tablet (1 mg total) by mouth daily.  Dispense: 30 tablet; Refill: 3  2. History of CVA (cerebrovascular accident) Continue on atorvastatin and adjust coumadin as above - warfarin (COUMADIN) 1 MG tablet; Take 1 tablet (1 mg total) by mouth daily.  Dispense: 30 tablet; Refill: 3  3. Candidiasis of breast Bilateral breast skin folds beefy redness noted  - encouraged to wear a bra everyday  - wash breast fold ,pat dry and apply Nystatin cream until redness resolved.Notify provider if no improvement - nystatin cream (MYCOSTATIN); Apply 1 Application topically 2 (two) times daily.  Dispense: 30 g; Refill: 0  Family/ staff Communication: Reviewed plan of care with patient verbalized understanding   Labs/tests ordered:  - POC INR   Next Appointment: Return in about 2 weeks (around 04/05/2022) for INR check .   Sandrea Hughs, NP

## 2022-03-22 NOTE — Patient Instructions (Addendum)
-   Skip coumadin dose tonight then restart tomorrow 03/23/2022   - Reduce coumadin 2 mg to 1 mg tablet daily and take with the 5 mg tablet.   - Take coumadin 5 mg tablet daily along with 1 mg tablet ( Total 6 mg tablet daily)

## 2022-03-23 ENCOUNTER — Other Ambulatory Visit: Payer: Self-pay

## 2022-03-23 DIAGNOSIS — B3789 Other sites of candidiasis: Secondary | ICD-10-CM

## 2022-03-23 MED ORDER — NYSTATIN 100000 UNIT/GM EX CREA: 1.0000 | TOPICAL_CREAM | Freq: Two times a day (BID) | CUTANEOUS | 0 refills | Status: DC

## 2022-03-28 ENCOUNTER — Encounter: Payer: Self-pay | Admitting: Family

## 2022-03-28 ENCOUNTER — Ambulatory Visit (INDEPENDENT_AMBULATORY_CARE_PROVIDER_SITE_OTHER): Payer: Medicare HMO | Admitting: Family

## 2022-03-28 VITALS — BP 120/80 | HR 60 | Temp 96.9°F | Resp 18 | Ht 67.0 in | Wt 130.0 lb

## 2022-03-28 DIAGNOSIS — Z1211 Encounter for screening for malignant neoplasm of colon: Secondary | ICD-10-CM

## 2022-03-28 DIAGNOSIS — Z0000 Encounter for general adult medical examination without abnormal findings: Secondary | ICD-10-CM

## 2022-03-28 NOTE — Progress Notes (Signed)
Subjective:   [name] is a 72 y.o. female who presents for Medicare Annual (Subsequent) preventive examination.  Review of Systems     Cardiac Risk Factors include: advanced age (>67mn, >>37women);hypertension;smoking/ tobacco exposure;dyslipidemia;sedentary lifestyle     Objective:    Today's Vitals   03/28/22 1126  BP: 120/80  Pulse: 60  Resp: 18  Temp: (!) 96.9 F (36.1 C)  SpO2: 96%  Weight: 130 lb (59 kg)  Height: '5\' 7"'$  (1.702 m)   Body mass index is 20.36 kg/m.     03/28/2022   11:19 AM 03/08/2022   11:00 AM 05/26/2020   11:09 AM 05/13/2020   11:46 AM 05/09/2020    4:01 PM 04/21/2020    3:00 PM 09/13/2018   12:00 AM  Advanced Directives  Does Patient Have a Medical Advance Directive? No No No No No No Unable to assess, patient is non-responsive or altered mental status  Would patient like information on creating a medical advance directive? No - Patient declined No - Patient declined No - Patient declined No - Patient declined No - Patient declined No - Patient declined     Current Medications (verified) Outpatient Encounter Medications as of 03/28/2022  Medication Sig   acetaminophen (TYLENOL) 325 MG tablet Take 650 mg by mouth 2 (two) times a week. As needed   atorvastatin (LIPITOR) 80 MG tablet Take 1 tablet (80 mg total) by mouth at bedtime.   Cholecalciferol 50 MCG (2000 UT) TABS Take 1 tablet by mouth daily in the afternoon.   empagliflozin (JARDIANCE) 10 MG TABS tablet Take 1 tablet (10 mg total) by mouth daily.   furosemide (LASIX) 20 MG tablet TAKE 1 TABLET(20 MG) BY MOUTH EVERY OTHER DAY   levETIRAcetam (KEPPRA) 750 MG tablet Take 2 tablets (1,500 mg total) by mouth 2 (two) times daily.   metoprolol succinate (TOPROL-XL) 50 MG 24 hr tablet Take 1 tablet (50 mg total) by mouth daily. Take with or immediately following a meal.   Nutritional Supplements (NUTRITIONAL SUPPLEMENT PO) Take 120 mLs by mouth once. Medpass for weight gain.   nystatin  cream (MYCOSTATIN) Apply 1 Application topically 2 (two) times daily.   sacubitril-valsartan (ENTRESTO) 97-103 MG Take 1 tablet by mouth 2 (two) times daily.   spironolactone (ALDACTONE) 25 MG tablet TAKE 1 TABLET(25 MG) BY MOUTH DAILY   VIMPAT 100 MG TABS Take 1 tablet (100 mg total) by mouth 2 (two) times daily.   warfarin (COUMADIN) 1 MG tablet Take 1 tablet (1 mg total) by mouth daily.   warfarin (COUMADIN) 5 MG tablet Take 1 tablet (5 mg total) by mouth daily.   No facility-administered encounter medications on file as of 03/28/2022.    Allergies (verified) Lexiscan [regadenoson]   History: Past Medical History:  Diagnosis Date   Adenomatous polyp of colon 09/2012   repeat colonoscopy in 5 years by Dr. PSharlett Iles  [place]    EF 15-20% as of 05/28/11 (Dr [facility])   CVA (cerebral infarction) 12/2007   H/O heart bypass surgery 2022   [place]    Hyperlipidemia    Hypertension    [place]    [place]    [place]    Vitamin D deficiency 2010   Past Surgical History:  Procedure Laterality Date   ABDOMINAL HYSTERECTOMY     BIV PACEMAKER INSERTION CRT-P N/A 04/29/2020   Procedure: BIV PACEMAKER INSERTION CRT-P;  Surgeon: CCurt Bears Will  Hassell Done, MD;  Location: [facility];  Service: Cardiovascular;  Laterality: N/A;   CORONARY ARTERY BYPASS GRAFT N/A 04/25/2020   Procedure: CORONARY ARTERY BYPASS GRAFTING (CABG) TIMES TWO USING LEFT INTERNAL MAMMARY ARTERY AND RIGHT GREATER SAPHENOUS VEIN HARVESTED ENDOSCOPICALLY;  Surgeon: Melrose Nakayama, MD;  Location: [facility];  Service: Open Heart Surgery;  Laterality: N/A;   FRACTURE SURGERY     HIP SURGERY     LEFT HEART CATH AND CORONARY ANGIOGRAPHY N/A 04/22/2020   Procedure: LEFT HEART CATH AND CORONARY ANGIOGRAPHY;  Surgeon: Larey Dresser, MD;  Location: [facility];  Service: Cardiovascular;  Laterality: N/A;   TEE WITHOUT CARDIOVERSION   04/25/2020   Procedure: TRANSESOPHAGEAL ECHOCARDIOGRAM (TEE);  Surgeon: Melrose Nakayama, MD;  Location: [facility];  Service: Open Heart Surgery;;   TEMPORARY PACEMAKER N/A 04/21/2020   Procedure: TEMPORARY PACEMAKER;  Surgeon: Larey Dresser, MD;  Location: [facility];  Service: Cardiovascular;  Laterality: N/A;   TUBAL LIGATION     Family History  Problem Relation Age of Onset   Diabetes Daughter    High Cholesterol Daughter    Hypertension Daughter    Heart disease Daughter    Hypertension Sister    High Cholesterol Sister    Asthma Brother    Bronchitis Brother    Colon cancer Maternal Grandfather    Social History   Socioeconomic History   Marital status: Married    Spouse name: [name]   Number of children: 2   Years of education: 12   Highest education level: Not on file  Occupational History    Employer: RETIRED  Tobacco Use   Smoking status: Former    Types: Cigarettes    Quit date: 09/26/2007    Years since quitting: 14.5   Smokeless tobacco: Never  Vaping Use   Vaping Use: Never used  Substance and Sexual Activity   Alcohol use: Not Currently    Comment: Former drinker, 15 years ago   Drug use: Never   Sexual activity: Not Currently  Other Topics Concern   Not on file  Social History Narrative   Tobacco use, amount per day now: None.   Past tobacco use, amount per day: Everyday.   How many years did you use tobacco: 30 years.   Alcohol use (drinks per week): Wine Daily   Diet:   Do you drink/eat things with caffeine: Yes   Marital status: Married                                  What year were you married?    Do you live in a house, apartment, assisted living, condo, trailer, etc.? House   Is it one or more stories? 1 story.   How many persons live in your home? 2   Do you have pets in your home?( please list) No   Highest Level of education completed? Associate   Current or past profession: Director    Do you exercise?    No                              Type and how often?   Do you have a living will?    Do you have a DNR form?  If not, do you want to discuss one?   Do you have signed POA/HPOA forms?                        If so, please bring to you appointment      Do you have any difficulty bathing or dressing yourself? Yes   Do you have any difficulty preparing food or eating? Yes   Do you have any difficulty managing your medications? Yes   Do you have any difficulty managing your finances? Yes   Do you have any difficulty affording your medications? Yes   Social Determinants of Health   Financial Resource Strain: Not on file  Food Insecurity: No Food Insecurity (10/20/2021)   Hunger Vital Sign    Worried About Running Out of Food in the Last Year: Never true    Ran Out of Food in the Last Year: Never true  Transportation Needs: No Transportation Needs (10/20/2021)   PRAPARE - Hydrologist (Medical): No    Lack of Transportation (Non-Medical): No  Physical Activity: Not on file  Stress: Not on file  Social Connections: Not on file    Tobacco Counseling Counseling given: Not Answered   Clinical Intake:  Pre-visit preparation completed: No  Pain : No/denies pain     BMI - recorded: 20.36 Nutritional Status: BMI of 19-24  Normal Nutritional Risks: None  How often do you need to have someone help you when you read instructions, pamphlets, or other written materials from your doctor or pharmacy?: 5 - Always What is the last grade level you completed in school?: community college 2 yrs  Business  Diabetic?No   Interpreter Needed?: No      Activities of Daily Living    03/28/2022   11:51 AM 03/28/2022   11:19 AM  In your present state of health, do you have any difficulty performing the following activities:  Hearing? 0 0  Vision? 0 0  Difficulty concentrating or making decisions? 0 0  Walking or climbing stairs? 1 0  Comment right side  weakness Patient has a walker and uses around the house.  Dressing or bathing? 1 0  Comment  Patient Husband helps her with the bathing and dressing.  Doing errands, shopping? 1 0  Comment Husband assist Patient husband helps her  Preparing Food and eating ? Y   Comment Husband assist   Using the Toilet? N   In the past six months, have you accidently leaked urine? Y   Do you have problems with loss of bowel control? N   Managing your Medications? Y   Comment Husband assist   Managing your Finances? Y   Comment Husband assist   Housekeeping or managing your Housekeeping? Y   Comment Husband assist     Patient Care Team: [name], MD as PCP - General (Family Medicine) Constance Haw, MD as PCP - Electrophysiology (Cardiology) Jerline Pain, MD as PCP - Cardiology (Cardiology) Adrian Prows, MD as Attending Physician (Cardiology) Burnard Bunting, MD (Internal Medicine) Skeet Latch, MD as Attending Physician (Cardiology) Leta Baptist Earlean Polka, MD as Consulting Physician (Neurology) Rex Kras, Claudette Stapler, RN as [place] any recent [place] you may have received from other than [facility] providers in the past year (date may be approximate).     Assessment:   This is a routine wellness examination for Alzena.  Hearing/Vision screen No results found.  Dietary issues and exercise activities discussed: Current Exercise Habits: The patient does not participate in regular exercise at present, Exercise limited by: neurologic condition(s)   Goals Addressed               This Visit's Progress     My wife needs some dental care (pt-stated)   Not on track     Care Coordination Interventions: Evaluation of current treatment plan related to tooth decay and patient's adherence to plan as established by provider Determined patient is need of dental care, husband reports patient has severe gum disease leading to tooth  decay Determined husband was advised patient will need full extraction and replaced of teeth with dentures Determined patient is not currently experiencing any discomfort from the decay, however patients cardiologist, Dr. Aundra Dubin advised the decay can lead to infection of the heart Determined patient's insurance will not cover dental care Collaborated with Daneen Schick BSW regarding resources for affordable dental care Provided husband with the contact number and address for [facility] dental services        Depression Screen    03/28/2022   11:19 AM 04/12/2020    9:09 AM 03/02/2020   10:42 AM 12/14/2019    8:42 AM 04/09/2019   10:17 AM 08/28/2017    9:58 AM 08/07/2017    2:24 PM  PHQ 2/9 Scores  PHQ - 2 Score 0 0 0 0 0 0 0    Fall Risk    03/28/2022   11:18 AM 03/08/2022   11:00 AM 10/26/2020   10:10 AM 04/12/2020    9:09 AM 03/02/2020   10:42 AM  Fall Risk   Falls in the past year? 0 0 0 0 0  Number falls in past yr: 0 0 0 0 0  Injury with Fall?  0 0 0 0  Risk for fall due to : No Fall Risks No Fall Risks History of fall(s)    Follow up Falls evaluation completed Falls evaluation completed Falls evaluation completed;Education provided;Falls prevention discussed Falls evaluation completed Falls evaluation completed    FALL RISK PREVENTION PERTAINING TO THE HOME:  Any stairs in or around the home? No  If so, are there any without handrails? No  Home free of loose throw rugs in walkways, pet beds, electrical cords, etc? No  Adequate lighting in your home to reduce risk of falls? Yes   ASSISTIVE DEVICES UTILIZED TO PREVENT FALLS:  Life alert? No  Use of a cane, walker or w/c? Yes  Grab bars in the bathroom? Yes  Shower chair or bench in shower? Yes  Elevated toilet seat or a handicapped toilet? Yes   TIMED UP AND GO:  Was the test performed? No .  Length of time to ambulate 10 feet: N/A  sec. Patient has right side weakness   Gait unsteady without use of assistive device, provider  informed and interventions were implemented  Cognitive Function:    03/28/2022   11:23 AM  MMSE - Mini Mental State Exam  Not completed: Unable to complete        Immunizations Immunization History  Administered Date(s) Administered   Fluad Quad(high Dose 65+) 10/10/2018, 10/26/2020, 11/29/2021   Influenza,inj,Quad PF,6+ Mos 12/12/2012, 10/23/2013, 09/30/2014, 04/05/2016   PFIZER Comirnaty(Gray Top)Covid-19 Tri-Sucrose Vaccine 05/04/2020   PFIZER(Purple Top)SARS-COV-2 Vaccination 03/22/2019, 04/15/2019   Pneumococcal Conjugate-13 09/30/2014   Pneumococcal Polysaccharide-23 04/05/2016   Tdap 12/12/2012    TDAP status: Up to date  Flu Vaccine status: Up to date  Pneumococcal vaccine  status: Up to date  Covid-19 vaccine status: Information provided on how to obtain vaccines.   Qualifies for Shingles Vaccine? Yes   Zostavax completed No   Shingrix Completed?: No.    Education has been provided regarding the importance of this vaccine. Patient has been advised to call insurance company to determine out of pocket expense if they have not yet received this vaccine. Advised may also receive vaccine at local pharmacy or Health Dept. Verbalized acceptance and understanding.  Screening Tests Health Maintenance  Topic Date Due   Zoster Vaccines- Shingrix (1 of 2) Never done   COLONOSCOPY (Pts 45-13yr Insurance coverage will need to be confirmed)  10/21/2017   COVID-19 Vaccine (4 - 2023-24 season) 09/22/2021   DTaP/Tdap/Td (2 - Td or Tdap) 12/13/2022   Medicare Annual Wellness (AWV)  03/28/2023   MAMMOGRAM  12/12/2023   Pneumonia Vaccine 72 Years old  Completed   INFLUENZA VACCINE  Completed   DEXA SCAN  Completed   Hepatitis C Screening  Completed   HPV VACCINES  Aged Out    Health Maintenance  Health Maintenance Due  Topic Date Due   Zoster Vaccines- Shingrix (1 of 2) Never done   COLONOSCOPY (Pts 45-467yrInsurance coverage will need to be confirmed)  10/21/2017    COVID-19 Vaccine (4 - 2023-24 season) 09/22/2021    Colorectal cancer screening: Referral to GI placed 03/28/2022 . Pt aware the office will call re: appt.  Mammogram status: Completed 12/11/2021 . Repeat every year  Bone Density status: Ordered has upcoming appointment in may.2024 . Pt provided with contact info and advised to call to schedule appt.  Lung Cancer Screening: (Low Dose CT Chest recommended if Age 74-80ears, 30 pack-year currently smoking OR have quit w/in 15years.) does not qualify.   Lung Cancer Screening Referral: No   Additional Screening:  Hepatitis C Screening: does qualify; Completed Yes   Vision Screening: Recommended annual ophthalmology exams for early detection of glaucoma and other disorders of the eye. Is the patient up to date with their annual eye exam?  Yes  Who is the provider or what is the name of the office in which the patient attends annual eye exams? [email or web address] [facility] is not established with a provider, would they like to be referred to a provider to establish care? No .   Dental Screening: Recommended annual dental exams for proper oral hygiene  Community Resource Referral / Chronic Care Management: CRR required this visit?  No   CCM required this visit?  No      Plan:     I have personally reviewed and noted the following in the patient's chart:   Medical and social history Use of alcohol, tobacco or illicit drugs  Current medications and supplements including opioid prescriptions. Patient is not currently taking opioid prescriptions. Functional ability and status Nutritional status Physical activity Advanced directives List of other physicians Hospitalizations, surgeries, and ER visits in previous 12 months Vitals Screenings to include cognitive, depression, and falls Referrals and appointments  In addition, I have reviewed and discussed with patient certain preventive protocols, quality metrics, and best practice  recommendations. A written personalized care plan for preventive services as well as general preventive health recommendations were provided to patient.     DiSandrea HughsNP   03/28/2022   Nurse Notes: Advised to get [place] and COVID-19 vaccine at the pharmacy

## 2022-03-28 NOTE — Patient Instructions (Signed)
[name] , Thank you for taking time to come for your Medicare Wellness Visit. I appreciate your ongoing commitment to your health goals. Please review the following plan we discussed and let me know if I can assist you in the future.   Screening recommendations/referrals: Colonoscopy : Ordered today  Mammogram : Up to date  Bone Density  : Up to date  Recommended yearly ophthalmology/optometry visit for glaucoma screening and checkup Recommended yearly dental visit for hygiene and checkup  Vaccinations: Influenza vaccine- due annually in September/October Pneumococcal vaccine  : Up to date  Tdap vaccine  : Up to date  Shingles vaccine : Due please get vaccine at your pharmacy     Advanced directives: No   Conditions/risks identified:  advanced age (>32mn, >>74women);hypertension;smoking/ tobacco exposure;dyslipidemia;sedentary lifestyle  Next appointment: 1 year    Preventive Care 622Years and Older, Female Preventive care refers to lifestyle choices and visits with your health care provider that can promote health and wellness. What does preventive care include? A yearly physical exam. This is also called an annual well check. Dental exams once or twice a year. Routine eye exams. Ask your health care provider how often you should have your eyes checked. Personal lifestyle choices, including: Daily care of your teeth and gums. Regular physical activity. Eating a healthy diet. Avoiding tobacco and drug use. Limiting alcohol use. Practicing safe sex. Taking low-dose aspirin every day. Taking vitamin and mineral supplements as recommended by your health care provider. What happens during an annual well check? The services and screenings done by your health care provider during your annual well check will depend on your age, overall health, lifestyle risk factors, and family history of disease. Counseling  Your health care provider may ask you questions about your: Alcohol  use. Tobacco use. Drug use. Emotional well-being. Home and relationship well-being. Sexual activity. Eating habits. History of falls. Memory and ability to understand (cognition). Work and work [employer] Reproductive health. Screening  You may have the following tests or measurements: Height, weight, and BMI. Blood pressure. Lipid and cholesterol levels. These may be checked every 5 years, or more frequently if you are over 515years old. Skin check. Lung cancer screening. You may have this screening every year starting at age [id number] you have a 30-pack-year history of smoking and currently smoke or have quit within the past 15 years. Fecal occult blood test (FOBT) of the stool. You may have this test every year starting at age [id number] Flexible sigmoidoscopy or colonoscopy. You may have a sigmoidoscopy every 5 years or a colonoscopy every 10 years starting at age [id number] Hepatitis C blood test. Hepatitis B blood test. Sexually transmitted disease (STD) testing. Diabetes screening. This is done by checking your blood sugar (glucose) after you have not eaten for a while (fasting). You may have this done every 1-3 years. Bone density scan. This is done to screen for osteoporosis. You may have this done starting at age 68675 Mammogram. This may be done every 1-2 years. Talk to your health care provider about how often you should have regular mammograms. Talk with your health care provider about your test results, treatment options, and if necessary, the need for more tests. Vaccines  Your health care provider may recommend certain vaccines, such as: Influenza vaccine. This is recommended every year. Tetanus, diphtheria, and acellular pertussis (Tdap, Td) vaccine. You may need a Td booster every 10 years. Zoster vaccine. You may need this after age 68656 Pneumococcal 13-valent  conjugate (PCV13) vaccine. One dose is recommended after age 37. Pneumococcal polysaccharide (PPSV23) vaccine. One dose is  recommended after age 75. Talk to your health care provider about which screenings and vaccines you need and how often you need them. This information is not intended to replace advice given to you by your health care provider. Make sure you discuss any questions you have with your health care provider. Document Released: 02/04/2015 Document Revised: 09/28/2015 Document Reviewed: 11/09/2014 Elsevier Interactive Patient Education  2017 [place] Prevention in the Home Falls can cause injuries. They can happen to people of all ages. There are many things you can do to make your home safe and to help prevent falls. What can I do on the outside of my home? Regularly fix the edges of walkways and driveways and fix any cracks. Remove anything that might make you trip as you walk through a door, such as a raised step or threshold. Trim any bushes or trees on the path to your home. Use bright outdoor lighting. Clear any walking paths of anything that might make someone trip, such as rocks or tools. Regularly check to see if handrails are loose or broken. Make sure that both sides of any steps have handrails. Any raised decks and porches should have guardrails on the edges. Have any leaves, snow, or ice cleared regularly. Use sand or salt on walking paths during winter. Clean up any spills in your garage right away. This includes oil or grease spills. What can I do in the bathroom? Use night lights. Install grab bars by the toilet and in the tub and shower. Do not use towel bars as grab bars. Use non-skid mats or decals in the tub or shower. If you need to sit down in the shower, use a plastic, non-slip stool. Keep the floor dry. Clean up any water that spills on the floor as soon as it happens. Remove soap buildup in the tub or shower regularly. Attach bath mats securely with double-sided non-slip rug tape. Do not have throw rugs and other things on the floor that can make you  trip. What can I do in the bedroom? Use night lights. Make sure that you have a light by your bed that is easy to reach. Do not use any sheets or blankets that are too big for your bed. They should not hang down onto the floor. Have a firm chair that has side arms. You can use this for support while you get dressed. Do not have throw rugs and other things on the floor that can make you trip. What can I do in the kitchen? Clean up any spills right away. Avoid walking on wet floors. Keep items that you use a lot in easy-to-reach places. If you need to reach something above you, use a strong step stool that has a grab bar. Keep electrical cords out of the way. Do not use floor polish or wax that makes floors slippery. If you must use wax, use non-skid floor wax. Do not have throw rugs and other things on the floor that can make you trip. What can I do with my stairs? Do not leave any items on the stairs. Make sure that there are handrails on both sides of the stairs and use them. Fix handrails that are broken or loose. Make sure that handrails are as long as the stairways. Check any carpeting to make sure that it is firmly attached to the stairs. Fix any carpet that  is loose or worn. Avoid having throw rugs at the top or bottom of the stairs. If you do have throw rugs, attach them to the floor with carpet tape. Make sure that you have a light switch at the top of the stairs and the bottom of the stairs. If you do not have them, ask someone to add them for you. What else can I do to help prevent falls? Wear shoes that: Do not have high heels. Have rubber bottoms. Are comfortable and fit you well. Are closed at the toe. Do not wear sandals. If you use a stepladder: Make sure that it is fully opened. Do not climb a closed stepladder. Make sure that both sides of the stepladder are locked into place. Ask someone to hold it for you, if possible. Clearly mark and make sure that you can  see: Any grab bars or handrails. First and last steps. Where the edge of each step is. Use tools that help you move around (mobility aids) if they are needed. These include: Canes. Walkers. Scooters. Crutches. Turn on the lights when you go into a dark area. Replace any light bulbs as soon as they burn out. Set up your furniture so you have a clear path. Avoid moving your furniture around. If any of your floors are uneven, fix them. If there are any pets around you, be aware of where they are. Review your medicines with your doctor. Some medicines can make you feel dizzy. This can increase your chance of falling. Ask your doctor what other things that you can do to help prevent falls. This information is not intended to replace advice given to you by your health care provider. Make sure you discuss any questions you have with your health care provider. Document Released: 11/04/2008 Document Revised: 06/16/2015 Document Reviewed: 02/12/2014 Elsevier Interactive Patient Education  2017 [employer].

## 2022-04-05 ENCOUNTER — Ambulatory Visit (INDEPENDENT_AMBULATORY_CARE_PROVIDER_SITE_OTHER): Payer: Medicare HMO | Admitting: Family

## 2022-04-05 ENCOUNTER — Encounter: Payer: Self-pay | Admitting: Family

## 2022-04-05 VITALS — BP 110/68 | HR 63 | Temp 97.8°F | Resp 16 | Ht 67.0 in | Wt 130.6 lb

## 2022-04-05 DIAGNOSIS — Z8673 Personal history of transient ischemic attack (TIA), and cerebral infarction without residual deficits: Secondary | ICD-10-CM | POA: Diagnosis not present

## 2022-04-05 DIAGNOSIS — Z5181 Encounter for therapeutic drug level monitoring: Secondary | ICD-10-CM

## 2022-04-05 DIAGNOSIS — Z7901 Long term (current) use of anticoagulants: Secondary | ICD-10-CM | POA: Diagnosis not present

## 2022-04-05 LAB — POCT INR
INR: 2.4 (ref 2.0–3.0)
POC INR: 2.4

## 2022-04-05 NOTE — Progress Notes (Signed)
Provider: Emanual Lamountain FNP-C  [name], MD  Patient Care Team: [name], MD as PCP - General (Family Medicine) Constance Haw, MD as PCP - Electrophysiology (Cardiology) Jerline Pain, MD as PCP - Cardiology (Cardiology) Adrian Prows, MD as Attending Physician (Cardiology) Burnard Bunting, MD (Internal Medicine) Skeet Latch, MD as Attending Physician (Cardiology) Leta Baptist, Earlean Polka, MD as Consulting Physician (Neurology) Rex Kras, Claudette Stapler, RN as [facility] Management  Extended Emergency Contact Information Primary Emergency Contact: Glen Sr Address: [address]          [place], [place] [address] [name] of [place] Phone: [phone] Mobile Phone: [phone] Relation: Spouse Secondary Emergency Contact: Ashley Mariner, [place] [address] [name] of [place] Phone: [phone] Relation: Son  Code Status:  Full Code  Goals of care: Advanced Directive information    03/28/2022   11:19 AM  Advanced Directives  Does Patient Have a Medical Advance Directive? No  Would patient like information on creating a medical advance directive? No - Patient declined     Chief Complaint  Patient presents with   Anticoagulation    INR 2.4    HPI:  Pt is a 72 y.o. female seen today for an acute visit to recheck INR.Previous INR was 3.8 goal 2.0 -3.0 for hx of CVA.Husband states patient seems to have missed one dose of coumadin 5 mg tablet on Friday.she accidentally dropped the pill and husband found it later on the floor. She denies any signs of bleeding.   Past Medical History:  Diagnosis Date   Adenomatous polyp of colon 09/2012   repeat colonoscopy in 5 years by Dr. Sharlett Iles   CHF (congestive heart failure) ([place])    EF 15-20% as of 05/28/11 (Dr [facility] summary)   CVA (cerebral infarction) 12/2007   H/O heart bypass surgery 2022   Hemiparesis ([facility])    Hyperlipidemia     Hypertension    Seizures ([facility])    Sickle cell anemia ([facility])    Stroke ([facility] [facility])    Vitamin D deficiency 2010   Past Surgical History:  Procedure Laterality Date   ABDOMINAL HYSTERECTOMY     BIV PACEMAKER INSERTION CRT-P N/A 04/29/2020   Procedure: BIV PACEMAKER INSERTION CRT-P;  Surgeon: Constance Haw, MD;  Location: [facility];  Service: Cardiovascular;  Laterality: N/A;   CORONARY ARTERY BYPASS GRAFT N/A 04/25/2020   Procedure: CORONARY ARTERY BYPASS GRAFTING (CABG) TIMES TWO USING LEFT INTERNAL MAMMARY ARTERY AND RIGHT GREATER SAPHENOUS VEIN HARVESTED ENDOSCOPICALLY;  Surgeon: Melrose Nakayama, MD;  Location: [facility];  Service: Open Heart Surgery;  Laterality: N/A;   FRACTURE SURGERY     HIP SURGERY     LEFT HEART CATH AND CORONARY ANGIOGRAPHY N/A 04/22/2020   Procedure: LEFT HEART CATH AND CORONARY ANGIOGRAPHY;  Surgeon: Larey Dresser, MD;  Location: [facility];  Service: Cardiovascular;  Laterality: N/A;   TEE WITHOUT CARDIOVERSION  04/25/2020   Procedure: TRANSESOPHAGEAL ECHOCARDIOGRAM (TEE);  Surgeon: Melrose Nakayama, MD;  Location[facility];  Service: Open Heart Surgery;;   TEMPORARY PACEMAKER N/A 04/21/2020   Procedure: TEMPORARY PACEMAKER;  Surgeon: Larey Dresser, MD;  Location: [facility];  Service: Cardiovascular;  Laterality: N/A;   TUBAL LIGATION      Allergies  Allergen Reactions   Lexiscan [Regadenoson] Other (See Comments)    Perioral tingling and throat tightness  Outpatient Encounter Medications as of 04/05/2022  Medication Sig   acetaminophen (TYLENOL) 325 MG tablet Take 650 mg by mouth 2 (two) times a week. As needed   atorvastatin (LIPITOR) 80 MG tablet Take 1 tablet (80 mg total) by mouth at bedtime.   Cholecalciferol 50 MCG (2000 UT) TABS Take 1 tablet by mouth daily in the afternoon.   empagliflozin (JARDIANCE) 10 MG TABS tablet Take 1 tablet (10 mg total) by mouth daily.   furosemide (LASIX) 20 MG tablet TAKE 1 TABLET(20  MG) BY MOUTH EVERY OTHER DAY   levETIRAcetam (KEPPRA) 750 MG tablet Take 2 tablets (1,500 mg total) by mouth 2 (two) times daily.   metoprolol succinate (TOPROL-XL) 50 MG 24 hr tablet Take 1 tablet (50 mg total) by mouth daily. Take with or immediately following a meal.   Nutritional Supplements (NUTRITIONAL SUPPLEMENT PO) Take 120 mLs by mouth once. Medpass for weight gain.   nystatin cream (MYCOSTATIN) Apply 1 Application topically 2 (two) times daily.   sacubitril-valsartan (ENTRESTO) 97-103 MG Take 1 tablet by mouth 2 (two) times daily.   spironolactone (ALDACTONE) 25 MG tablet TAKE 1 TABLET(25 MG) BY MOUTH DAILY   VIMPAT 100 MG TABS Take 1 tablet (100 mg total) by mouth 2 (two) times daily.   warfarin (COUMADIN) 1 MG tablet Take 1 tablet (1 mg total) by mouth daily.   warfarin (COUMADIN) 5 MG tablet Take 1 tablet (5 mg total) by mouth daily.   No facility-administered encounter medications on file as of 04/05/2022.    Review of Systems  Reason unable to perform ROS: additional HPI information provided by Husband present during visit.  Constitutional:  Negative for appetite change, chills, fatigue, fever and unexpected weight change.  HENT:  Negative for congestion, dental problem, ear discharge, ear pain, facial swelling, hearing loss, nosebleeds, postnasal drip, rhinorrhea, sinus pressure, sinus pain, sneezing, sore throat, tinnitus and trouble swallowing.   Eyes:  Negative for pain, discharge, redness, itching and visual disturbance.  Respiratory:  Negative for cough, chest tightness, shortness of breath and wheezing.   Cardiovascular:  Negative for chest pain, palpitations and leg swelling.  Gastrointestinal:  Negative for abdominal distention, abdominal pain, blood in stool, constipation, diarrhea, nausea and vomiting.  Genitourinary:  Negative for difficulty urinating, dysuria, flank pain, frequency and urgency.       Incontinent   Musculoskeletal:  Positive for gait problem.  Negative for arthralgias, back pain, joint swelling, myalgias, neck pain and neck stiffness.  Skin:  Negative for color change, pallor, rash and wound.  Neurological:  Positive for weakness. Negative for dizziness, syncope, speech difficulty, light-headedness, numbness and headaches.       Right hand and leg paralysis Aphasia   Hematological:  Does not bruise/bleed easily.  Psychiatric/Behavioral:  Negative for agitation, behavioral problems, confusion, hallucinations and sleep disturbance. The patient is not nervous/anxious.     Immunization History  Administered Date(s) Administered   Fluad Quad(high Dose 65+) 10/10/2018, 10/26/2020, 11/29/2021   Influenza,inj,Quad PF,6+ Mos 12/12/2012, 10/23/2013, 09/30/2014, 04/05/2016   PFIZER Comirnaty(Gray Top)Covid-19 Tri-Sucrose Vaccine 05/04/2020   PFIZER(Purple Top)SARS-COV-2 Vaccination 03/22/2019, 04/15/2019   Pneumococcal Conjugate-13 09/30/2014   Pneumococcal Polysaccharide-23 04/05/2016   Tdap 12/12/2012   Pertinent  Health Maintenance Due  Topic Date Due   COLONOSCOPY (Pts 45-67yr Insurance coverage will need to be confirmed)  10/21/2017   MAMMOGRAM  12/12/2023   INFLUENZA VACCINE  Completed   DEXA SCAN  Completed      05/05/2020    7:27 PM 08/09/2020  5:23 PM 10/26/2020   10:10 AM 03/08/2022   11:00 AM 03/28/2022   11:18 AM  Fall Risk  Falls in the past year?   0 0 0  Was there an injury with Fall?   0 0   Fall Risk Category Calculator   0 0   Fall Risk Category (Retired)   Low    (RETIRED) Patient Fall Risk Level High fall risk Moderate fall risk Moderate fall risk    Patient at Risk for Falls Due to   History of fall(s) No Fall Risks No Fall Risks  Fall risk Follow up   Falls evaluation completed;Education provided;Falls prevention discussed Falls evaluation completed Falls evaluation completed   Functional Status Survey:    Vitals:   04/05/22 1010  BP: 110/68  Pulse: 63  Resp: 16  Temp: 97.8 F (36.6 C)  TempSrc:  Temporal  SpO2: 98%  Weight: 130 lb 9.6 oz (59.2 kg)  Height: '5\' 7"'$  (1.702 m)   Body mass index is 20.45 kg/m. Physical Exam Vitals reviewed.  Constitutional:      General: She is not in acute distress.    Appearance: Normal appearance. She is normal weight. She is not ill-appearing or diaphoretic.  HENT:     Head: Normocephalic.     Nose: Nose normal. No congestion or rhinorrhea.     Mouth/Throat:     Mouth: Mucous membranes are moist.     Pharynx: Oropharynx is clear. No oropharyngeal exudate or posterior oropharyngeal erythema.  Eyes:     General: No scleral icterus.       Right eye: No discharge.        Left eye: No discharge.     Conjunctiva/sclera: Conjunctivae normal.     Pupils: Pupils are equal, round, and reactive to light.  Neck:     Vascular: No carotid bruit.  Cardiovascular:     Rate and Rhythm: Normal rate and regular rhythm.     Pulses: Normal pulses.     Heart sounds: Normal heart sounds. No murmur heard.    No friction rub. No gallop.  Pulmonary:     Effort: Pulmonary effort is normal. No respiratory distress.     Breath sounds: Normal breath sounds. No wheezing, rhonchi or rales.  Chest:     Chest wall: No tenderness.  Abdominal:     General: Bowel sounds are normal. There is no distension.     Palpations: Abdomen is soft. There is no mass.     Tenderness: There is no abdominal tenderness. There is no right CVA tenderness, left CVA tenderness, guarding or rebound.  Musculoskeletal:        General: No swelling or tenderness.     Cervical back: Normal range of motion. No rigidity or tenderness.     Right lower leg: No edema.     Left lower leg: No edema.     Comments: Right hand contracture  On wheelchair during visit   Lymphadenopathy:     Cervical: No cervical adenopathy.  Skin:    General: Skin is warm and dry.     Coloration: Skin is not pale.     Findings: No bruising, erythema or rash.  Neurological:     Mental Status: She is alert. Mental  status is at baseline.     Cranial Nerves: No cranial nerve deficit.     Sensory: No sensory deficit.     Motor: Weakness present.     Gait: Gait abnormal.     Comments:  Right hemiparesis   Psychiatric:        Mood and Affect: Mood normal.        Speech: Speech normal.        Behavior: Behavior normal.        Thought Content: Thought content normal.        Judgment: Judgment normal.     Labs reviewed: Recent Labs    06/12/21 0935 09/12/21 1039 12/28/21 1027  NA 140 140 136  K 4.4 4.4 4.5  CL 110 108 105  CO2 '23 22 23  '$ GLUCOSE 91 88 79  BUN '23 19 22  '$ CREATININE 1.19* 1.14* 1.11*  CALCIUM 9.1 9.2 9.2   No results for input(s): "AST", "ALT", "ALKPHOS", "BILITOT", "PROT", "ALBUMIN" in the last 8760 hours. Recent Labs    06/12/21 0935 09/12/21 1039 12/28/21 1027  WBC 5.1 5.3 5.0  HGB 11.9* 12.3 13.2  HCT 38.1 38.1 41.0  MCV 90.7 89.6 89.7  PLT 224 259 244   Lab Results  Component Value Date   TSH 1.846 10/23/2013   Lab Results  Component Value Date   HGBA1C 6.1 (H) 04/22/2020   Lab Results  Component Value Date   CHOL 126 06/12/2021   HDL 46 06/12/2021   LDLCALC 67 06/12/2021   TRIG 63 06/12/2021   CHOLHDL 2.7 06/12/2021    Significant Diagnostic Results in last 30 days:  No results found.  Assessment/Plan 1. Encounter for monitoring Coumadin therapy INR 2.4 therapeutic today goal 2.0 -3.0 No signs of bleeding noted  - continue on coumadin 6 mg tablet daily   - POC INR  2. History of CVA (cerebrovascular accident) Right hand contracture and leg weakness  - continue on coumadin and Atorvastatin   Family/ staff Communication: Reviewed plan of care with patient and Husband verbalized understanding   Labs/tests ordered:  - POC INR  Next Appointment: Return in about 1 month (around 05/06/2022) for INR check .   Sandrea Hughs, NP

## 2022-04-30 ENCOUNTER — Ambulatory Visit (INDEPENDENT_AMBULATORY_CARE_PROVIDER_SITE_OTHER): Payer: Medicare HMO

## 2022-04-30 DIAGNOSIS — I442 Atrioventricular block, complete: Secondary | ICD-10-CM | POA: Diagnosis not present

## 2022-04-30 LAB — CUP PACEART REMOTE DEVICE CHECK
Battery Remaining Longevity: 65 mo
Battery Remaining Percentage: 70 %
Battery Voltage: 2.99 V
Brady Statistic AP VP Percent: 25 %
Brady Statistic AP VS Percent: 1 %
Brady Statistic AS VP Percent: 72 %
Brady Statistic AS VS Percent: 1 %
Brady Statistic RA Percent Paced: 23 %
Date Time Interrogation Session: 20240408020010
Implantable Lead Connection Status: 753985
Implantable Lead Connection Status: 753985
Implantable Lead Connection Status: 753985
Implantable Lead Implant Date: 20220411
Implantable Lead Implant Date: 20220411
Implantable Lead Implant Date: 20220411
Implantable Lead Location: 753858
Implantable Lead Location: 753859
Implantable Lead Location: 753860
Implantable Pulse Generator Implant Date: 20220411
Lead Channel Impedance Value: 360 Ohm
Lead Channel Impedance Value: 410 Ohm
Lead Channel Impedance Value: 550 Ohm
Lead Channel Pacing Threshold Amplitude: 0.375 V
Lead Channel Pacing Threshold Amplitude: 0.75 V
Lead Channel Pacing Threshold Amplitude: 0.75 V
Lead Channel Pacing Threshold Pulse Width: 0.5 ms
Lead Channel Pacing Threshold Pulse Width: 0.5 ms
Lead Channel Pacing Threshold Pulse Width: 0.5 ms
Lead Channel Sensing Intrinsic Amplitude: 3.1 mV
Lead Channel Sensing Intrinsic Amplitude: 9.4 mV
Lead Channel Setting Pacing Amplitude: 2 V
Lead Channel Setting Pacing Amplitude: 2 V
Lead Channel Setting Pacing Amplitude: 2 V
Lead Channel Setting Pacing Pulse Width: 0.5 ms
Lead Channel Setting Pacing Pulse Width: 0.5 ms
Lead Channel Setting Sensing Sensitivity: 4 mV
Pulse Gen Model: 3562
Pulse Gen Serial Number: 6262141

## 2022-05-07 ENCOUNTER — Encounter: Payer: Medicare HMO | Admitting: Family

## 2022-05-07 NOTE — Progress Notes (Signed)
  This encounter was created in error - please disregard. No show 

## 2022-05-10 NOTE — Progress Notes (Signed)
Chief Complaint  Patient presents with   Follow-up    Pt in room 1, husband in room. Here for Seizure follow up. Pt husband reports pt doing well, no concerns, no recent seizures.     HISTORY OF PRESENT ILLNESS:  05/16/22 ALL:  [name] returns for follow up for seizures. Husband provides history. She continues levetiracetam  and lacosamide  BID. No obvious adverse effects. No seizure events. She continues close follow up with PCP. Labs have been stable. She continues atorvastatin and warfarin. She continues to walk without assistance around the home but uses wheelchair outside home.   05/15/2021 ALL: [name] is a 72 y.o. female here today for follow up for seizures. She presents with her husband who provides history. She continues levetiracetam  and lacosamide  twice daily. No seizure activity. She is tolerating meds without obvious adverse effects. She is able to perform ADLs with minimal assistance. She walks around home without assistance but uses wheelchair for any long distance walking. She has significant aphasia, receptive and expressive.   She continues close follow up with PCP and cardiology. She continues atorvastatin  daily. On warfarin as well.   HISTORY (copied from Dr Richrd Humbles previous note)  UPDATE (12/30/19, VRP): Since last visit, doing well. No seizures. Tolerating meds.     PRIOR HPI: 72 year old female here for evaluation of seizure.  History of left hemisphere stroke in 2009 with resultant right-sided hemiparesis.  Patient also had seizure disorder started around 2009.  Patient was doing well until August 2020 when patient had run out of antiseizure medications and had breakthrough seizures, brought to the hospital and treated.  Patient had multiple clinical and electrographic seizures in the hospital.  Patient was maintained on levetiracetam and Vimpat and dilantin; then Dilantin was tapered off at discharge.    Since then  patient is stable.  Tolerating medications.    REVIEW OF SYSTEMS: Out of a complete 14 system review of symptoms, the patient complains only of the following symptoms, right sided weakness, difficulty with speech, and all other reviewed systems are negative.   ALLERGIES: Allergies  Allergen Reactions   Lexiscan [Regadenoson] Other (See Comments)    Perioral tingling and throat tightness     HOME MEDICATIONS: Outpatient Medications Prior to Visit  Medication Sig Dispense Refill   acetaminophen (TYLENOL) 325 MG tablet Take 650 mg by mouth 2 (two) times a week. As needed     atorvastatin (LIPITOR) 80 MG tablet Take 1 tablet (80 mg total) by mouth at bedtime. 90 tablet 1   Cholecalciferol 50 MCG (2000 UT) TABS Take 1 tablet by mouth daily in the afternoon.     empagliflozin (JARDIANCE) 10 MG TABS tablet Take 1 tablet (10 mg total) by mouth daily. 30 tablet 11   furosemide (LASIX) 20 MG tablet TAKE 1 TABLET(20 MG) BY MOUTH EVERY OTHER DAY 90 tablet 3   metoprolol succinate (TOPROL-XL) 50 MG 24 hr tablet Take 1 tablet (50 mg total) by mouth daily. Take with or immediately following a meal. 90 tablet 3   Nutritional Supplements (NUTRITIONAL SUPPLEMENT PO) Take 120 mLs by mouth once. Medpass for weight gain.     nystatin cream (MYCOSTATIN) Apply 1 Application topically 2 (two) times daily. 30 g 0   sacubitril-valsartan (ENTRESTO) 97-103 MG Take 1 tablet by mouth 2 (two) times daily. 60 tablet 11   spironolactone (ALDACTONE) 25 MG tablet TAKE 1 TABLET(25 MG) BY MOUTH DAILY 30 tablet 11   warfarin (COUMADIN) 1  MG tablet Take 1 tablet (1 mg total) by mouth daily. 30 tablet 3   warfarin (COUMADIN) 5 MG tablet Take 1 tablet (5 mg total) by mouth daily. 30 tablet 1   levETIRAcetam (KEPPRA) 750 MG tablet Take 2 tablets (1,500 mg total) by mouth 2 (two) times daily. 360 tablet 3   VIMPAT 100 MG TABS Take 1 tablet (100 mg total) by mouth 2 (two) times daily. 28 tablet 0   No facility-administered  medications prior to visit.     PAST MEDICAL HISTORY: Past Medical History:  Diagnosis Date   Adenomatous polyp of colon 09/2012   repeat colonoscopy in 5 years by Dr. Jarold Motto   CHF (congestive heart failure)    EF 15-20% as of 05/28/11 (Dr Casimiro Needle [facility] summary)   CVA (cerebral infarction) 12/2007   H/O heart bypass surgery 2022   Hemiparesis    Hyperlipidemia    Hypertension    Seizures    Sickle cell anemia    Stroke    Vitamin D deficiency 2010     PAST SURGICAL HISTORY: Past Surgical History:  Procedure Laterality Date   ABDOMINAL HYSTERECTOMY     BIV PACEMAKER INSERTION CRT-P N/A 04/29/2020   Procedure: BIV PACEMAKER INSERTION CRT-P;  Surgeon: Regan Lemming, MD;  Location: [facility] INVASIVE CV LAB;  Service: Cardiovascular;  Laterality: N/A;   CORONARY ARTERY BYPASS GRAFT N/A 04/25/2020   Procedure: CORONARY ARTERY BYPASS GRAFTING (CABG) TIMES TWO USING LEFT INTERNAL MAMMARY ARTERY AND RIGHT GREATER SAPHENOUS VEIN HARVESTED ENDOSCOPICALLY;  Surgeon: Loreli Slot, MD;  Location: [facility] OR;  Service: Open Heart Surgery;  Laterality: N/A;   FRACTURE SURGERY     HIP SURGERY     LEFT HEART CATH AND CORONARY ANGIOGRAPHY N/A 04/22/2020   Procedure: LEFT HEART CATH AND CORONARY ANGIOGRAPHY;  Surgeon: Laurey Morale, MD;  Location: [facility] INVASIVE CV LAB;  Service: Cardiovascular;  Laterality: N/A;   TEE WITHOUT CARDIOVERSION  04/25/2020   Procedure: TRANSESOPHAGEAL ECHOCARDIOGRAM (TEE);  Surgeon: Loreli Slot, MD;  Location: [facility] OR;  Service: Open Heart Surgery;;   TEMPORARY PACEMAKER N/A 04/21/2020   Procedure: TEMPORARY PACEMAKER;  Surgeon: Laurey Morale, MD;  Location: [facility] INVASIVE CV LAB;  Service: Cardiovascular;  Laterality: N/A;   TUBAL LIGATION       FAMILY HISTORY: Family History  Problem Relation Age of Onset   Diabetes Daughter    High Cholesterol Daughter    Hypertension Daughter    Heart disease Daughter    Hypertension Sister    High  Cholesterol Sister    Asthma Brother    Bronchitis Brother    Colon cancer Maternal Grandfather      SOCIAL HISTORY: Social History   Socioeconomic History   Marital status: Married    Spouse name: [name]   Number of children: 2   Years of education: 12   Highest education level: Not on file  Occupational History    Employer: RETIRED  Tobacco Use   Smoking status: Former    Types: Cigarettes    Quit date: 09/26/2007    Years since quitting: 14.6   Smokeless tobacco: Never  Vaping Use   Vaping Use: Never used  Substance and Sexual Activity   Alcohol use: Not Currently    Comment: Former drinker, 15 years ago   Drug use: Never   Sexual activity: Not Currently  Other Topics Concern   Not on file  Social History Narrative   Tobacco use, amount per day now:  None.   Past tobacco use, amount per day: Everyday.   How many years did you use tobacco: 30 years.   Alcohol use (drinks per week): Wine Daily   Diet:   Do you drink/eat things with caffeine: Yes   Marital status: Married                                  What year were you married?    Do you live in a house, apartment, assisted living, condo, trailer, etc.? House   Is it one or more stories? 1 story.   How many persons live in your home? 2   Do you have pets in your home?( please list) No   Highest Level of education completed? Associate   Current or past profession: Director    Do you exercise?    No                             Type and how often?   Do you have a living will?    Do you have a DNR form?                                   If not, do you want to discuss one?   Do you have signed POA/HPOA forms?                        If so, please bring to you appointment      Do you have any difficulty bathing or dressing yourself? Yes   Do you have any difficulty preparing food or eating? Yes   Do you have any difficulty managing your medications? Yes   Do you have any difficulty managing your finances? Yes   Do  you have any difficulty affording your medications? Yes   Social Determinants of Health   Financial Resource Strain: Not on file  Food Insecurity: No Food Insecurity (10/20/2021)   Hunger Vital Sign    Worried About Running Out of Food in the Last Year: Never true    Ran Out of Food in the Last Year: Never true  Transportation Needs: No Transportation Needs (10/20/2021)   PRAPARE - Administrator, Civil Service (Medical): No    Lack of Transportation (Non-Medical): No  Physical Activity: Not on file  Stress: Not on file  Social Connections: Not on file  Intimate Partner Violence: Not on file     PHYSICAL EXAM  Vitals:   05/16/22 0949  BP: (!) 111/54  Pulse: 62  Height: 5\' 7"  (1.702 m)    Body mass index is 20.45 kg/m.  Generalized: Well developed, in no acute distress  Cardiology: normal rate and rhythm, no murmur auscultated  Respiratory: clear to auscultation bilaterally    Neurological examination  Mentation: Alert, unable to assess orientation, severe expressive and receptive aphasia.  Cranial nerve II-XII: Pupils were equal round reactive to light. Extraocular movements were full, visual field were full on confrontational test. Facial sensation and strength were normal. Head turning and shoulder shrug  were normal and symmetric. Motor: The motor testing reveals 5 over 5 strength of left upper and lower ext, right upper ext 1/5. Right lower 1+-2/5.  Sensory: Sensory testing is intact to soft touch on all 4  extremities. No evidence of extinction is noted.  Coordination: unable to test, does not follow commands  Gait and station: gait not assessed, in wheelchair.    DIAGNOSTIC DATA (LABS, IMAGING, TESTING) - I reviewed patient records, labs, notes, testing and imaging myself where available.  Lab Results  Component Value Date   WBC 5.0 12/28/2021   HGB 13.2 12/28/2021   HCT 41.0 12/28/2021   MCV 89.7 12/28/2021   PLT 244 12/28/2021      Component  Value Date/Time   NA 136 12/28/2021 1027   NA 137 02/28/2021 1038   K 4.5 12/28/2021 1027   CL 105 12/28/2021 1027   CO2 23 12/28/2021 1027   GLUCOSE 79 12/28/2021 1027   BUN 22 12/28/2021 1027   BUN 20 02/28/2021 1038   CREATININE 1.11 (H) 12/28/2021 1027   CREATININE 0.83 09/30/2014 0852   CALCIUM 9.2 12/28/2021 1027   PROT 7.5 02/28/2021 1038   ALBUMIN 4.6 02/28/2021 1038   AST 21 02/28/2021 1038   ALT 8 02/28/2021 1038   ALKPHOS 123 (H) 02/28/2021 1038   BILITOT 0.3 02/28/2021 1038   GFRNONAA 53 (L) 12/28/2021 1027   GFRNONAA 75 03/26/2014 1105   GFRAA >90 05/12/2020 0000   GFRAA 87 03/26/2014 1105   Lab Results  Component Value Date   CHOL 126 06/12/2021   HDL 46 06/12/2021   LDLCALC 67 06/12/2021   TRIG 63 06/12/2021   CHOLHDL 2.7 06/12/2021   Lab Results  Component Value Date   HGBA1C 6.1 (H) 04/22/2020   No results found for: "VITAMINB12" Lab Results  Component Value Date   TSH 1.846 10/23/2013       03/28/2022   11:23 AM  MMSE - Mini Mental State Exam  Not completed: Unable to complete         No data to display           ASSESSMENT AND PLAN  72 y.o. year old female  has a past medical history of Adenomatous polyp of colon (09/2012), CHF (congestive heart failure), CVA (cerebral infarction) (12/2007), H/O heart bypass surgery (2022), Hemiparesis, Hyperlipidemia, Hypertension, Seizures, Sickle cell anemia, Stroke, and Vitamin D deficiency (2010). here with    Seizure disorder  [name] is tolerating medicaiton well with no seizure activity. We will continue levetiracetam  and lacosamide  twice daily. She will continue close follow up with PCP and cardiology. Seizure and stroke prevention education provided to Mr [name]. Healthy lifestyle habits encouraged. She will return to see me in 1 year, sooner if needed. She verbalizes understanding and agreement with this plan.   No orders of the defined types were placed in this  encounter.    Meds ordered this encounter  Medications   DISCONTD: levETIRAcetam (KEPPRA) 750 MG tablet    Sig: Take 2 tablets (1,500 mg total) by mouth 2 (two) times daily.    Dispense:  360 tablet    Refill:  3    Order Specific Question:   Supervising Provider    Answer:   Anson Fret [[id number]]   levETIRAcetam (KEPPRA) 750 MG tablet    Sig: Take 2 tablets (1,500 mg total) by mouth 2 (two) times daily.    Dispense:  360 tablet    Refill:  3    Please do not fill until patient requests, has overstock at home    Order Specific Question:   Supervising Provider    Answer:   Anson Fret [[id number]]   VIMPAT 100  MG TABS    Sig: Take 1 tablet (100 mg total) by mouth 2 (two) times daily.    Dispense:  180 tablet    Refill:  1    Order Specific Question:   Supervising Provider    Answer:   Anson Fret [[id number]]     Shawnie Dapper, MSN, FNP-C 05/16/2022, 10:15 AM  [facility] [address], [address] [place], [place] [address] [phone]

## 2022-05-10 NOTE — Patient Instructions (Signed)
Below is our plan:  We will continue levetiracetam  and lacosamide  twice daily.  Please make sure you are consistent with timing of seizure medication. I recommend annual visit with primary care provider (PCP) for complete physical and routine blood work. I recommend daily intake of vitamin D (400-800iu) and calcium (800-1000mg ) for bone health. Discuss Dexa screening with PCP.   According to [place] law, you can not drive unless you are seizure / syncope free for at least 6 months and under physician's care.  Please maintain precautions. Do not participate in activities where a loss of awareness could harm you or someone else. No swimming alone, no tub bathing, no hot tubs, no driving, no operating motorized vehicles (cars, ATVs, motocycles, etc), lawnmowers, power tools or firearms. No standing at heights, such as rooftops, ladders or stairs. Avoid hot objects such as stoves, heaters, open fires. Wear a helmet when riding a bicycle, scooter, skateboard, etc. and avoid areas of traffic. Set your water heater to 120 degrees or less.  Please make sure you are staying well hydrated. I recommend 50-60 ounces daily. Well balanced diet and regular exercise encouraged. Consistent sleep schedule with 6-8 hours recommended.   Please continue follow up with care team as directed.   Follow up with me in 1 year   You may receive a survey regarding today's visit. I encourage you to leave honest feed back as I do use this information to improve patient care. Thank you for seeing me today!

## 2022-05-15 ENCOUNTER — Other Ambulatory Visit: Payer: Self-pay | Admitting: Family

## 2022-05-16 ENCOUNTER — Ambulatory Visit: Payer: Medicare HMO | Admitting: Family Medicine

## 2022-05-16 ENCOUNTER — Encounter: Payer: Self-pay | Admitting: Family Medicine

## 2022-05-16 VITALS — BP 111/54 | HR 62 | Ht 67.0 in

## 2022-05-16 DIAGNOSIS — G40909 Epilepsy, unspecified, not intractable, without status epilepticus: Secondary | ICD-10-CM | POA: Diagnosis not present

## 2022-05-16 MED ORDER — LEVETIRACETAM 750 MG PO TABS: 1500.0000 mg | ORAL_TABLET | Freq: Two times a day (BID) | ORAL | 3 refills | Status: DC

## 2022-05-16 MED ORDER — VIMPAT 100 MG PO TABS: 1.0000 | ORAL_TABLET | Freq: Two times a day (BID) | ORAL | 1 refills | Status: DC

## 2022-05-24 ENCOUNTER — Ambulatory Visit
Admission: RE | Admit: 2022-05-24 | Discharge: 2022-05-24 | Disposition: A | Discharge status: Home / Self Care | Payer: Medicare HMO | Source: Ambulatory Visit | Attending: Family Medicine | Admitting: Family Medicine

## 2022-05-24 DIAGNOSIS — E559 Vitamin D deficiency, unspecified: Secondary | ICD-10-CM | POA: Diagnosis not present

## 2022-05-24 DIAGNOSIS — Z90710 Acquired absence of both cervix and uterus: Secondary | ICD-10-CM | POA: Diagnosis not present

## 2022-05-24 DIAGNOSIS — N951 Menopausal and female climacteric states: Secondary | ICD-10-CM | POA: Diagnosis not present

## 2022-05-24 DIAGNOSIS — E349 Endocrine disorder, unspecified: Secondary | ICD-10-CM | POA: Diagnosis not present

## 2022-05-24 DIAGNOSIS — M810 Age-related osteoporosis without current pathological fracture: Secondary | ICD-10-CM | POA: Diagnosis not present

## 2022-05-24 DIAGNOSIS — Z13820 Encounter for screening for osteoporosis: Secondary | ICD-10-CM | POA: Diagnosis not present

## 2022-06-06 ENCOUNTER — Encounter: Payer: Self-pay | Admitting: Family Medicine

## 2022-06-06 ENCOUNTER — Ambulatory Visit (INDEPENDENT_AMBULATORY_CARE_PROVIDER_SITE_OTHER): Payer: Medicare HMO | Admitting: Family Medicine

## 2022-06-06 VITALS — BP 120/70 | HR 65 | Temp 95.9°F

## 2022-06-06 DIAGNOSIS — Z7901 Long term (current) use of anticoagulants: Secondary | ICD-10-CM

## 2022-06-06 DIAGNOSIS — E782 Mixed hyperlipidemia: Secondary | ICD-10-CM

## 2022-06-06 DIAGNOSIS — R2681 Unsteadiness on feet: Secondary | ICD-10-CM | POA: Diagnosis not present

## 2022-06-06 DIAGNOSIS — I442 Atrioventricular block, complete: Secondary | ICD-10-CM | POA: Diagnosis not present

## 2022-06-06 DIAGNOSIS — G40909 Epilepsy, unspecified, not intractable, without status epilepticus: Secondary | ICD-10-CM | POA: Diagnosis not present

## 2022-06-06 DIAGNOSIS — R7303 Prediabetes: Secondary | ICD-10-CM | POA: Diagnosis not present

## 2022-06-06 DIAGNOSIS — I10 Essential (primary) hypertension: Secondary | ICD-10-CM

## 2022-06-06 DIAGNOSIS — I63412 Cerebral infarction due to embolism of left middle cerebral artery: Secondary | ICD-10-CM

## 2022-06-06 DIAGNOSIS — I110 Hypertensive heart disease with heart failure: Secondary | ICD-10-CM | POA: Diagnosis not present

## 2022-06-06 DIAGNOSIS — I509 Heart failure, unspecified: Secondary | ICD-10-CM | POA: Diagnosis not present

## 2022-06-06 DIAGNOSIS — D571 Sickle-cell disease without crisis: Secondary | ICD-10-CM | POA: Diagnosis not present

## 2022-06-06 NOTE — Progress Notes (Signed)
Provider:  Jacalyn Lefevre, MD  Careteam: Patient Care Team: Frederica Kuster, MD as PCP - General (Family Medicine) Regan Lemming, MD as PCP - Electrophysiology (Cardiology) Jake Bathe, MD as PCP - Cardiology (Cardiology) Yates Decamp, MD as Attending Physician (Cardiology) Geoffry Paradise, MD (Internal Medicine) Chilton Si, MD as Attending Physician (Cardiology) Marjory Lies, Glenford Bayley, MD as Consulting Physician (Neurology) Clarene Duke, Karma Lew, RN as [facility] Care Management  PLACE OF SERVICE:  [facility]  Advanced Directive information    Allergies  Allergen Reactions   Lexiscan [Regadenoson] Other (See Comments)    Perioral tingling and throat tightness    Chief Complaint  Patient presents with   Medical Management of Chronic Issues    Patient presents today for a 6 month follow-up   Quality Metric Gaps    Colonoscopy, zoster, COVID#4     HPI: Patient is a 72 y.o. female who is here for 42-month follow-up for medical management of chronic problems including epilepsy, atherosclerosis, AV block, old cerebral infarction, she seems to be doing well.  She is companied by her husband has usual who is her main caregiver.  Around the house now she is walking with a 4-prong walker cooking meals making her bed tolerating getting dressed and generally doing well given her limitations of right hemiparesis specially upper extremity.  There has been some urinary frequency without dysuria.  She has no history of UTIs.  She continues to monitor her PT/INR is here and adjustments have been recently made.  Review of Systems:  Review of Systems  Constitutional: Negative.   Respiratory: Negative.    Cardiovascular: Negative.   Genitourinary:  Positive for frequency.  Skin:  Positive for rash.       Has monilia under breasts and uses nystatin  Neurological:  Positive for speech change and seizures.  Psychiatric/Behavioral: Negative.    All other systems  reviewed and are negative.   Past Medical History:  Diagnosis Date   Adenomatous polyp of colon 09/2012   repeat colonoscopy in 5 years by Dr. Jarold Motto   CHF (congestive heart failure) (HCC)    EF 15-20% as of 05/28/11 (Dr Casimiro Needle [facility] D/C summary)   CVA (cerebral infarction) 12/2007   H/O heart bypass surgery 2022   Hemiparesis (HCC)    Hyperlipidemia    Hypertension    Seizures (HCC)    Sickle cell anemia (HCC)    Stroke (HCC)    Vitamin D deficiency 2010   Past Surgical History:  Procedure Laterality Date   ABDOMINAL HYSTERECTOMY     BIV PACEMAKER INSERTION CRT-P N/A 04/29/2020   Procedure: BIV PACEMAKER INSERTION CRT-P;  Surgeon: Regan Lemming, MD;  Location: [facility] INVASIVE CV LAB;  Service: Cardiovascular;  Laterality: N/A;   CORONARY ARTERY BYPASS GRAFT N/A 04/25/2020   Procedure: CORONARY ARTERY BYPASS GRAFTING (CABG) TIMES TWO USING LEFT INTERNAL MAMMARY ARTERY AND RIGHT GREATER SAPHENOUS VEIN HARVESTED ENDOSCOPICALLY;  Surgeon: Loreli Slot, MD;  Location: [facility] OR;  Service: Open Heart Surgery;  Laterality: N/A;   FRACTURE SURGERY     HIP SURGERY     LEFT HEART CATH AND CORONARY ANGIOGRAPHY N/A 04/22/2020   Procedure: LEFT HEART CATH AND CORONARY ANGIOGRAPHY;  Surgeon: Laurey Morale, MD;  Location: [facility] INVASIVE CV LAB;  Service: Cardiovascular;  Laterality: N/A;   TEE WITHOUT CARDIOVERSION  04/25/2020   Procedure: TRANSESOPHAGEAL ECHOCARDIOGRAM (TEE);  Surgeon: Loreli Slot, MD;  Location: [facility] OR;  Service: Open Heart Surgery;;  TEMPORARY PACEMAKER N/A 04/21/2020   Procedure: TEMPORARY PACEMAKER;  Surgeon: Laurey Morale, MD;  Location: [facility] INVASIVE CV LAB;  Service: Cardiovascular;  Laterality: N/A;   TUBAL LIGATION     Social History:   reports that she quit smoking about 14 years ago. Her smoking use included cigarettes. She has never used smokeless tobacco. She reports that she does not currently use alcohol. She reports that she does not use  drugs.  Family History  Problem Relation Age of Onset   Diabetes Daughter    High Cholesterol Daughter    Hypertension Daughter    Heart disease Daughter    Hypertension Sister    High Cholesterol Sister    Asthma Brother    Bronchitis Brother    Colon cancer Maternal Grandfather     Medications: Patient's Medications  New Prescriptions   No medications on file  Previous Medications   ACETAMINOPHEN (TYLENOL) 325 MG TABLET    Take 650 mg by mouth 2 (two) times a week. As needed   ATORVASTATIN (LIPITOR) 80 MG TABLET    Take 1 tablet (80 mg total) by mouth at bedtime.   CHOLECALCIFEROL 50 MCG (2000 UT) TABS    Take 1 tablet by mouth daily in the afternoon.   EMPAGLIFLOZIN (JARDIANCE) 10 MG TABS TABLET    Take 1 tablet (10 mg total) by mouth daily.   FUROSEMIDE (LASIX) 20 MG TABLET    TAKE 1 TABLET(20 MG) BY MOUTH EVERY OTHER DAY   LEVETIRACETAM (KEPPRA) 750 MG TABLET    Take 2 tablets (1,500 mg total) by mouth 2 (two) times daily.   METOPROLOL SUCCINATE (TOPROL-XL) 50 MG 24 HR TABLET    Take 1 tablet (50 mg total) by mouth daily. Take with or immediately following a meal.   NUTRITIONAL SUPPLEMENTS (NUTRITIONAL SUPPLEMENT PO)    Take 120 mLs by mouth once. Medpass for weight gain.   NYSTATIN CREAM (MYCOSTATIN)    Apply 1 Application topically 2 (two) times daily.   SACUBITRIL-VALSARTAN (ENTRESTO) 97-103 MG    Take 1 tablet by mouth 2 (two) times daily.   SPIRONOLACTONE (ALDACTONE) 25 MG TABLET    TAKE 1 TABLET(25 MG) BY MOUTH DAILY   VIMPAT 100 MG TABS    Take 1 tablet (100 mg total) by mouth 2 (two) times daily.   WARFARIN (COUMADIN) 1 MG TABLET    Take 1 tablet (1 mg total) by mouth daily.   WARFARIN (COUMADIN) 5 MG TABLET    TAKE 1 TABLET(5 MG) BY MOUTH DAILY  Modified Medications   No medications on file  Discontinued Medications   No medications on file    Physical Exam:  There were no vitals filed for this visit. There is no height or weight on file to calculate BMI. Wt  Readings from Last 3 Encounters:  04/05/22 130 lb 9.6 oz (59.2 kg)  03/28/22 130 lb (59 kg)  03/22/22 130 lb (59 kg)    Physical Exam Vitals and nursing note reviewed.  Constitutional:      Appearance: Normal appearance.  HENT:     Right Ear: Tympanic membrane normal.     Left Ear: Tympanic membrane normal.  Cardiovascular:     Rate and Rhythm: Normal rate and regular rhythm.  Pulmonary:     Effort: Pulmonary effort is normal.     Breath sounds: Normal breath sounds.  Abdominal:     General: Bowel sounds are normal.     Palpations: Abdomen is soft.  Neurological:  Mental Status: She is alert and oriented to person, place, and time.     Comments: Scratch that right sided weakness continues to wear splint on wrist has little function of right upper extremity.  Reflexes are hyper as with be expected on right side  Psychiatric:        Mood and Affect: Mood normal.        Behavior: Behavior normal.     Labs reviewed: Basic Metabolic Panel: Recent Labs    06/12/21 0935 09/12/21 1039 12/28/21 1027  NA 140 140 136  K 4.4 4.4 4.5  CL 110 108 105  CO2 23 22 23   GLUCOSE 91 88 79  BUN 23 19 22   CREATININE 1.19* 1.14* 1.11*  CALCIUM 9.1 9.2 9.2   Liver Function Tests: No results for input(s): "AST", "ALT", "ALKPHOS", "BILITOT", "PROT", "ALBUMIN" in the last 8760 hours. No results for input(s): "LIPASE", "AMYLASE" in the last 8760 hours. No results for input(s): "AMMONIA" in the last 8760 hours. CBC: Recent Labs    06/12/21 0935 09/12/21 1039 12/28/21 1027  WBC 5.1 5.3 5.0  HGB 11.9* 12.3 13.2  HCT 38.1 38.1 41.0  MCV 90.7 89.6 89.7  PLT 224 259 244   Lipid Panel: Recent Labs    06/12/21 0935  CHOL 126  HDL 46  LDLCALC 67  TRIG 63  CHOLHDL 2.7   TSH: No results for input(s): "TSH" in the last 8760 hours. A1C: Lab Results  Component Value Date   HGBA1C 6.1 (H) 04/22/2020     Assessment/Plan  1. Primary hypertension Blood pressure 120/70 today  "controlled on Aldactone and Entresto  2. Mixed hyperlipidemia Continues on atorvastatin 80 mg LDL at goal at 67 when last checked 1 year ago  3. Prediabetes Concern with urinary frequency that her sugars may be creeping up we will check random blood sugar today  4. Cerebrovascular accident (CVA) due to embolism of left middle cerebral artery [facility]) Stroke is old and not acute has adapted very well to her limitations right hemiparesis  5. Anticoagulated on Coumadin Protimes are followed here been some necessary recent adjustments again offered her rapid acting newer anticoagulants but still resistant to try anything other than Coumadin  6. Complete heart block (HCC) Followed by cardiology pacemaker in place  7. Gait instability Uses wheelchair for trips outside the house but uses 4-prong walker inside the house.  There have been no falls by history  8. Seizure disorder (HCC) No recent seizures followed by neurology Continue with Keppra and Vimpat   Jacalyn Lefevre, MD [facility] [phone]

## 2022-06-07 LAB — COMPLETE METABOLIC PANEL WITH GFR
AG Ratio: 1.4 (calc) (ref 1.0–2.5)
ALT: 9 U/L (ref 6–29)
AST: 23 U/L (ref 10–35)
Albumin: 4.6 g/dL (ref 3.6–5.1)
Alkaline phosphatase (APISO): 118 U/L (ref 37–153)
BUN/Creatinine Ratio: 21 (calc) (ref 6–22)
BUN: 26 mg/dL — ABNORMAL HIGH (ref 7–25)
CO2: 20 mmol/L (ref 20–32)
Calcium: 9.6 mg/dL (ref 8.6–10.4)
Chloride: 105 mmol/L (ref 98–110)
Creat: 1.25 mg/dL — ABNORMAL HIGH (ref 0.60–1.00)
Globulin: 3.2 g/dL (calc) (ref 1.9–3.7)
Glucose, Bld: 75 mg/dL (ref 65–99)
Potassium: 4.7 mmol/L (ref 3.5–5.3)
Sodium: 139 mmol/L (ref 135–146)
Total Bilirubin: 0.4 mg/dL (ref 0.2–1.2)
Total Protein: 7.8 g/dL (ref 6.1–8.1)
eGFR: 46 mL/min/{1.73_m2} — ABNORMAL LOW (ref >=60)

## 2022-06-07 LAB — LIPID PANEL
Cholesterol: 141 mg/dL (ref <200)
HDL: 46 mg/dL — ABNORMAL LOW (ref >=50)
LDL Cholesterol (Calc): 76 mg/dL (calc)
Non-HDL Cholesterol (Calc): 95 mg/dL (calc) (ref <130)
Total CHOL/HDL Ratio: 3.1 (calc) (ref <5.0)
Triglycerides: 108 mg/dL (ref <150)

## 2022-06-07 LAB — HEMOGLOBIN A1C
Hgb A1c MFr Bld: 6.1 % of total Hgb — ABNORMAL HIGH (ref <5.7)
Mean Plasma Glucose: 128 mg/dL
eAG (mmol/L): 7.1 mmol/L

## 2022-06-07 NOTE — Progress Notes (Signed)
Remote pacemaker transmission.   

## 2022-06-13 ENCOUNTER — Encounter: Payer: Self-pay | Admitting: Podiatry

## 2022-06-13 ENCOUNTER — Ambulatory Visit: Payer: Medicare HMO | Admitting: Podiatry

## 2022-06-13 VITALS — BP 98/60

## 2022-06-13 DIAGNOSIS — B351 Tinea unguium: Secondary | ICD-10-CM

## 2022-06-13 DIAGNOSIS — M79674 Pain in right toe(s): Secondary | ICD-10-CM | POA: Diagnosis not present

## 2022-06-13 DIAGNOSIS — M79675 Pain in left toe(s): Secondary | ICD-10-CM

## 2022-06-13 DIAGNOSIS — Z950 Presence of cardiac pacemaker: Secondary | ICD-10-CM | POA: Insufficient documentation

## 2022-06-13 NOTE — Progress Notes (Signed)
  Subjective:  Patient ID: [name], female    DOB: 1950/12/18,  MRN: [id number]  [name] presents to clinic today for:  Chief Complaint  Patient presents with   Nail Problem    RFC PCP-Miller PCP VST- Last week  .  PCP is Frederica Kuster, MD.  Allergies  Allergen Reactions   Lexiscan [Regadenoson] Other (See Comments)    Perioral tingling and throat tightness    Review of Systems: Negative except as noted in the HPI.  Objective: No changes noted in today's physical examination. Vitals:   06/13/22 0933  BP: 98/60    [name] is a pleasant 72 y.o. female in NAD. AAO x 3.  Vascular Examination: Capillary refill time <3 seconds b/l LE. Palpable pedal pulses b/l LE. Digital hair present b/l. No pedal edema b/l. Skin temperature gradient WNL b/l. No varicosities b/l. Marland Kitchen  Dermatological Examination: Pedal skin with normal turgor, texture and tone b/l. No open wounds. No interdigital macerations b/l. Toenails 1-5 b/l thickened, discolored, dystrophic with subungual debris. There is pain on palpation to dorsal aspect of nailplates.  Neurological Examination: Protective sensation intact with 10 gram monofilament b/l LE. Vibratory sensation intact b/l LE.   Musculoskeletal Examination: Muscle strength 5/5 to all LE muscle groups b/l. No pain, crepitus or joint limitation noted with ROM bilateral LE. No gross bony deformities bilaterally.     Latest Ref Rng & Units 06/06/2022   10:09 AM  Hemoglobin A1C  Hemoglobin-A1c <5.7 % of total Hgb 6.1    Assessment/Plan: 1. Pain due to onychomycosis of toenails of both feet    Patient was evaluated and treated. All patient's and/or POA's questions/concerns addressed on today's visit. Toenails 1-5 debrided in length and girth without incident. Continue soft, supportive shoe gear daily. Report any pedal injuries to medical professional. Call office if there are any questions/concerns. -Patient/POA to  call should there be question/concern in the interim.   Return in about 3 months (around 09/13/2022).  Freddie Breech, DPM

## 2022-07-16 ENCOUNTER — Other Ambulatory Visit: Payer: Self-pay

## 2022-07-16 ENCOUNTER — Telehealth: Payer: Self-pay | Admitting: Family Medicine

## 2022-07-16 MED ORDER — WARFARIN SODIUM 5 MG PO TABS: ORAL_TABLET | ORAL | 1 refills | Status: DC

## 2022-07-16 MED ORDER — LEVETIRACETAM 750 MG PO TABS: 1500.0000 mg | ORAL_TABLET | Freq: Two times a day (BID) | ORAL | 3 refills | Status: DC

## 2022-07-16 NOTE — Telephone Encounter (Signed)
Pt's husband Matthew Saras [name] Sr. Request refill for levETIRAcetam (KEPPRA) 750 MG tablet  sent to  [facility] #[id number]

## 2022-07-16 NOTE — Telephone Encounter (Signed)
E-scribed rx 

## 2022-07-20 ENCOUNTER — Other Ambulatory Visit: Payer: Self-pay | Admitting: Diagnostic Neuroimaging

## 2022-07-20 NOTE — Telephone Encounter (Signed)
Pt is needing a refill request for her VIMPAT 100 MG TABS sent in to [facility].

## 2022-07-24 ENCOUNTER — Other Ambulatory Visit: Payer: Self-pay | Admitting: Neurology

## 2022-07-24 ENCOUNTER — Telehealth: Payer: Self-pay | Admitting: Family Medicine

## 2022-07-24 NOTE — Telephone Encounter (Signed)
Message noted from phone room.

## 2022-07-24 NOTE — Telephone Encounter (Signed)
Pt's husband said call [facility], they said physician need to call in because is a narcotic and is shipped out state. Would like a call from the nurse.

## 2022-07-24 NOTE — Telephone Encounter (Signed)
Please call patient and let her know she should have a refill at [facility] already, she just needs to reach out to them for the refill. She can call [phone]  Amy sent in a 90 day supply with 1 refill in April.

## 2022-07-24 NOTE — Telephone Encounter (Signed)
Phone room please let pt know Rx has been called in.

## 2022-07-24 NOTE — Telephone Encounter (Signed)
I called pharmacy to see what was going on regarding the refill for Vimpat 100 mg tablet refill. (We received another call today about Rx.)    Shawnie Dapper, NP approved Rx on 05/16/22 for #180 tablets with 1 refill. [facility] said that since Amy is a NP in the state of [place] she can't prescribe controlled substances and it must be under a MD name.    The pharmacist said patient last filled Rx on 06/20/22 for #60 tablets and she is due for refills. I spoke with pharmacist and give a verbal #90 tablets (like Amy wrote on 05/16/22) with 0 refills.    I spoke verbally spoke with Dr. Daron Offer about this as well.

## 2022-07-24 NOTE — Telephone Encounter (Signed)
Meds ordered this encounter  Medications   Lacosamide (VIMPAT) 100 MG TABS    Sig: Take 1 tablet by mouth every morning and 1 tablet every night at bedtime    Dispense:  60 tablet    Refill:  5    Suanne Marker, MD 07/24/2022, 4:51 PM Certified in Neurology, Neurophysiology and Neuroimaging  [facility] [address], [address] [place], [place] [address] [phone]

## 2022-07-24 NOTE — Telephone Encounter (Addendum)
Pt is requesting a refill for VIMPAT 100 MG TABS  .  Pharmacy: [facility] reports that pt only has 3 days worth remaining.

## 2022-07-24 NOTE — Telephone Encounter (Signed)
Dr. Marjory Lies   I called pharmacy to see what was going on regarding the refill for Vimpat 100 mg tablet refill. (We received another call today about Rx.)   Shawnie Dapper, NP approved Rx on 05/16/22 for #180 tablets with 1 refill. [facility] said that since Amy is a NP in the state of [place] she can't prescribe controlled substances and it must be under a MD name.   The pharmacist said patient last filled Rx on 06/20/22 for #60 tablets and she is due for refills. I spoke with pharmacist and give a verbal #90 tablets (like Amy wrote on 05/16/22) with 0 refills.   You can deny this request.   Thanks!

## 2022-07-25 NOTE — Telephone Encounter (Signed)
Notified pt's husband prescription called in

## 2022-07-30 ENCOUNTER — Ambulatory Visit (INDEPENDENT_AMBULATORY_CARE_PROVIDER_SITE_OTHER): Payer: Medicare HMO

## 2022-07-30 DIAGNOSIS — I442 Atrioventricular block, complete: Secondary | ICD-10-CM

## 2022-07-31 LAB — CUP PACEART REMOTE DEVICE CHECK
Battery Remaining Longevity: 61 mo
Battery Remaining Percentage: 67 %
Battery Voltage: 2.99 V
Brady Statistic AP VP Percent: 19 %
Brady Statistic AP VS Percent: 1 %
Brady Statistic AS VP Percent: 79 %
Brady Statistic AS VS Percent: 1 %
Brady Statistic RA Percent Paced: 18 %
Date Time Interrogation Session: 20240708020009
Implantable Lead Connection Status: 753985
Implantable Lead Connection Status: 753985
Implantable Lead Connection Status: 753985
Implantable Lead Implant Date: 20220411
Implantable Lead Implant Date: 20220411
Implantable Lead Implant Date: 20220411
Implantable Lead Location: 753858
Implantable Lead Location: 753859
Implantable Lead Location: 753860
Implantable Pulse Generator Implant Date: 20220411
Lead Channel Impedance Value: 350 Ohm
Lead Channel Impedance Value: 410 Ohm
Lead Channel Impedance Value: 550 Ohm
Lead Channel Pacing Threshold Amplitude: 0.625 V
Lead Channel Pacing Threshold Amplitude: 0.75 V
Lead Channel Pacing Threshold Amplitude: 0.75 V
Lead Channel Pacing Threshold Pulse Width: 0.5 ms
Lead Channel Pacing Threshold Pulse Width: 0.5 ms
Lead Channel Pacing Threshold Pulse Width: 0.5 ms
Lead Channel Sensing Intrinsic Amplitude: 3.3 mV
Lead Channel Sensing Intrinsic Amplitude: 6.9 mV
Lead Channel Setting Pacing Amplitude: 2 V
Lead Channel Setting Pacing Amplitude: 2 V
Lead Channel Setting Pacing Amplitude: 2 V
Lead Channel Setting Pacing Pulse Width: 0.5 ms
Lead Channel Setting Pacing Pulse Width: 0.5 ms
Lead Channel Setting Sensing Sensitivity: 4 mV
Pulse Gen Model: 3562
Pulse Gen Serial Number: 6262141

## 2022-08-02 ENCOUNTER — Other Ambulatory Visit: Payer: Self-pay

## 2022-08-02 MED ORDER — ATORVASTATIN CALCIUM 80 MG PO TABS: 80.0000 mg | ORAL_TABLET | Freq: Every day | ORAL | 1 refills | Status: DC

## 2022-08-02 NOTE — Telephone Encounter (Signed)
Patient's husband called stating that patient needs refill on medication.

## 2022-08-16 NOTE — Progress Notes (Signed)
Remote pacemaker transmission.   

## 2022-08-17 ENCOUNTER — Other Ambulatory Visit: Payer: Self-pay | Admitting: Family

## 2022-08-21 ENCOUNTER — Telehealth: Payer: Self-pay

## 2022-08-21 NOTE — Telephone Encounter (Signed)
I think monthly is good unless we are seeing frequent change, then needs to be more frequent

## 2022-08-21 NOTE — Telephone Encounter (Signed)
Patients son called requesting a refill on coumadin 2 mg tablet, stating patient takes 1/2 tablet daily.   I asked who is monitoring PT/INR and [name] ststed we were, however I noticed that last INR was 04/05/22 and ideally we are to check monthly.   I scheduled an appointment with Monina for Thursday at 9:20 am and asked if patient has enough medication to last until appointment and Alnoza stated she has enough.  Dr.Miller please advise on the recommended frequency of checking PT/INR

## 2022-08-23 ENCOUNTER — Encounter: Payer: Self-pay | Admitting: Adult Health

## 2022-08-23 ENCOUNTER — Ambulatory Visit: Payer: Medicare HMO | Admitting: Adult Health

## 2022-08-23 VITALS — BP 108/68 | HR 66 | Temp 97.8°F | Resp 17 | Wt 130.0 lb

## 2022-08-23 DIAGNOSIS — Z7901 Long term (current) use of anticoagulants: Secondary | ICD-10-CM

## 2022-08-23 DIAGNOSIS — I442 Atrioventricular block, complete: Secondary | ICD-10-CM | POA: Diagnosis not present

## 2022-08-23 DIAGNOSIS — B3789 Other sites of candidiasis: Secondary | ICD-10-CM | POA: Diagnosis not present

## 2022-08-23 DIAGNOSIS — Z8673 Personal history of transient ischemic attack (TIA), and cerebral infarction without residual deficits: Secondary | ICD-10-CM

## 2022-08-23 DIAGNOSIS — I10 Essential (primary) hypertension: Secondary | ICD-10-CM | POA: Diagnosis not present

## 2022-08-23 DIAGNOSIS — E782 Mixed hyperlipidemia: Secondary | ICD-10-CM

## 2022-08-23 DIAGNOSIS — R791 Abnormal coagulation profile: Secondary | ICD-10-CM

## 2022-08-23 DIAGNOSIS — R7303 Prediabetes: Secondary | ICD-10-CM | POA: Diagnosis not present

## 2022-08-23 DIAGNOSIS — B372 Candidiasis of skin and nail: Secondary | ICD-10-CM

## 2022-08-23 DIAGNOSIS — G40909 Epilepsy, unspecified, not intractable, without status epilepticus: Secondary | ICD-10-CM | POA: Diagnosis not present

## 2022-08-23 LAB — POCT INR: POC INR: 3.5

## 2022-08-23 MED ORDER — WARFARIN SODIUM 5 MG PO TABS
ORAL_TABLET | ORAL | 1 refills | Status: DC
Start: 2022-08-23 — End: 2022-10-26

## 2022-08-23 MED ORDER — NYSTATIN 100000 UNIT/GM EX CREA: 1.0000 | TOPICAL_CREAM | Freq: Two times a day (BID) | CUTANEOUS | 0 refills | Status: DC

## 2022-08-23 NOTE — Progress Notes (Signed)
[facility] clinic  Provider:  Kenard Gower DNP  Code Status:  Full Code  Goals of Care:     08/23/2022    9:19 AM  Advanced Directives  Does Patient Have a Medical Advance Directive? Yes  Type of Advance Directive Healthcare Power of Attorney  Does patient want to make changes to medical advance directive? No - Patient declined  Copy of Healthcare Power of Attorney in Chart? No - copy requested     Chief Complaint  Patient presents with   Acute Visit    Patient is being seen for INR testing and refill on nystatin. Patient has been having some gas for a few days     HPI: Patient is a 72 y.o. female seen today for an acute visit for INR check. She was accompanied by her husband. She has not had INR check in for a long time. INR was last taken on 04/05/22. She takes Coumadin 6 mg daily for  history of CVA.  INR 3.5, therapeutic with INR goal 2.5 to 3.5. No bruising nor bleeding.   Past Medical History:  Diagnosis Date   Adenomatous polyp of colon 09/2012   repeat colonoscopy in 5 years by Dr. Jarold Motto   CHF (congestive heart failure) (HCC)    EF 15-20% as of 05/28/11 (Dr Casimiro Needle [facility] D[facility] summary)   CVA (cerebral infarction) 12/2007   H/O heart bypass surgery 2022   Hemiparesis (HCC)    Hyperlipidemia    Hypertension    Seizures (HCC)    Sickle cell anemia (HCC)    Stroke (HCC)    Vitamin D deficiency 2010    Past Surgical History:  Procedure Laterality Date   ABDOMINAL HYSTERECTOMY     BIV PACEMAKER INSERTION CRT-P N/A 04/29/2020   Procedure: BIV PACEMAKER INSERTION CRT-P;  Surgeon: Regan Lemming, MD;  [facility]: [facility] INVASIVE CV LAB;  Service: Cardiovascular;  Laterality: N/A;   CORONARY ARTERY BYPASS GRAFT N/A 04/25/2020   Procedure: CORONARY ARTERY BYPASS GRAFTING (CABG) TIMES TWO USING LEFT INTERNAL MAMMARY ARTERY AND RIGHT GREATER SAPHENOUS VEIN HARVESTED ENDOSCOPICALLY;  Surgeon: Loreli Slot, MD;  Location: [facility] OR;  Service: Open Heart  Surgery;  Laterality: N/A;   FRACTURE SURGERY     HIP SURGERY     LEFT HEART CATH AND CORONARY ANGIOGRAPHY N/A 04/22/2020   Procedure: LEFT HEART CATH AND CORONARY ANGIOGRAPHY;  Surgeon: Laurey Morale, MD;  Location: [facility] INVASIVE CV LAB;  Service: Cardiovascular;  Laterality: N/A;   TEE WITHOUT CARDIOVERSION  04/25/2020   Procedure: TRANSESOPHAGEAL ECHOCARDIOGRAM (TEE);  Surgeon: Loreli Slot, MD;  Location: [facility] OR;  Service: Open Heart Surgery;;   TEMPORARY PACEMAKER N/A 04/21/2020   Procedure: TEMPORARY PACEMAKER;  Surgeon: Laurey Morale, MD;  Location: [facility] INVASIVE CV LAB;  Service: Cardiovascular;  Laterality: N/A;   TUBAL LIGATION      Allergies  Allergen Reactions   Lexiscan [Regadenoson] Other (See Comments)    Perioral tingling and throat tightness    Outpatient Encounter Medications as of 08/23/2022  Medication Sig   acetaminophen (TYLENOL) 325 MG tablet Take 650 mg by mouth 2 (two) times a week. As needed   atorvastatin (LIPITOR) 80 MG tablet Take 1 tablet (80 mg total) by mouth at bedtime.   Cholecalciferol 50 MCG (2000 UT) TABS Take 1 tablet by mouth daily in the afternoon.   empagliflozin (JARDIANCE) 10 MG TABS tablet Take 1 tablet (10 mg total) by mouth daily.   furosemide (LASIX) 20 MG tablet TAKE  1 TABLET(20 MG) BY MOUTH EVERY OTHER DAY   Lacosamide (VIMPAT) 100 MG TABS Take 1 tablet by mouth every morning and 1 tablet every night at bedtime   levETIRAcetam (KEPPRA) 750 MG tablet Take 2 tablets (1,500 mg total) by mouth 2 (two) times daily.   metoprolol succinate (TOPROL-XL) 50 MG 24 hr tablet Take 1 tablet (50 mg total) by mouth daily. Take with or immediately following a meal.   Nutritional Supplements (NUTRITIONAL SUPPLEMENT PO) Take 120 mLs by mouth once. Medpass for weight gain.   nystatin cream (MYCOSTATIN) Apply 1 Application topically 2 (two) times daily.   sacubitril-valsartan (ENTRESTO) 97-103 MG Take 1 tablet by mouth 2 (two) times daily.    spironolactone (ALDACTONE) 25 MG tablet TAKE 1 TABLET(25 MG) BY MOUTH DAILY   warfarin (COUMADIN) 1 MG tablet Take 1 tablet (1 mg total) by mouth daily.   warfarin (COUMADIN) 5 MG tablet TAKE 1 TABLET(5 MG) BY MOUTH DAILY   No facility-administered encounter medications on file as of 08/23/2022.    Review of Systems:  Review of Systems  Unable to obtain due to aphasia  Health Maintenance  Topic Date Due   Zoster Vaccines- Shingrix (1 of 2) Never done   Colonoscopy  10/21/2017   COVID-19 Vaccine (4 - 2023-24 season) 09/22/2021   INFLUENZA VACCINE  08/23/2022   DTaP/Tdap/Td (2 - Td or Tdap) 12/13/2022   Medicare Annual Wellness (AWV)  03/28/2023   MAMMOGRAM  12/12/2023   Pneumonia Vaccine 74+ Years old  Completed   DEXA SCAN  Completed   Hepatitis C Screening  Completed   HPV VACCINES  Aged Out    Physical Exam: Vitals:   08/23/22 0916  BP: 108/68  Pulse: 66  Resp: 17  Temp: 97.8 F (36.6 C)  TempSrc: Temporal  SpO2: 96%  Weight: 130 lb (59 kg)   Body mass index is 20.36 kg/m. Physical Exam Constitutional:      Appearance: Normal appearance.  HENT:     Head: Normocephalic and atraumatic.     Nose: Nose normal.     Mouth/Throat:     Mouth: Mucous membranes are moist.  Eyes:     Conjunctiva/sclera: Conjunctivae normal.  Cardiovascular:     Rate and Rhythm: Normal rate and regular rhythm.     Comments: Left chest pacemaker Pulmonary:     Effort: Pulmonary effort is normal.     Breath sounds: Normal breath sounds.  Abdominal:     General: Bowel sounds are normal.     Palpations: Abdomen is soft.  Musculoskeletal:     Cervical back: Normal range of motion.  Skin:    General: Skin is warm and dry.     Findings: Rash present.     Comments: Mild erythematous rashes Under bilateral breast   Neurological:     Mental Status: She is alert. Mental status is at baseline.     Motor: Weakness present.     Comments: Right hemiparesis, aphasic  Psychiatric:        Mood  and Affect: Mood normal.        Behavior: Behavior normal.        Thought Content: Thought content normal.        Judgment: Judgment normal.     Labs reviewed: Basic Metabolic Panel: Recent Labs    09/12/21 1039 12/28/21 1027 06/06/22 1009  NA 140 136 139  K 4.4 4.5 4.7  CL 108 105 105  CO2 22 23 20   GLUCOSE 88 79  75  BUN 19 22 26*  CREATININE 1.14* 1.11* 1.25*  CALCIUM 9.2 9.2 9.6   Liver Function Tests: Recent Labs    06/06/22 1009  AST 23  ALT 9  BILITOT 0.4  PROT 7.8   No results for input(s): "LIPASE", "AMYLASE" in the last 8760 hours. No results for input(s): "AMMONIA" in the last 8760 hours. CBC: Recent Labs    09/12/21 1039 12/28/21 1027  WBC 5.3 5.0  HGB 12.3 13.2  HCT 38.1 41.0  MCV 89.6 89.7  PLT 259 244   Lipid Panel: Recent Labs    06/06/22 1009  CHOL 141  HDL 46*  LDLCALC 76  TRIG 623  CHOLHDL 3.1   Lab Results  Component Value Date   HGBA1C 6.1 (H) 06/06/2022    Procedures since last visit: CUP PACEART REMOTE DEVICE CHECK  Result Date: 07/31/2022 Scheduled remote reviewed. Normal device function.  Next remote 91 days. LA, CVRS   Assessment/Plan  1. Anticoagulation monitoring, INR range 2.5-3.5 -  continue Coumadin 6 mg daily -  husband requested for Coumadin 5 mg tab and takes together with Coumadin  0.5 tab of Coumadin 2 mg tab = 6 mg tab total - POC INR 3.5, therapeutic - warfarin (COUMADIN) 5 MG tablet; TAKE 1 TABLET(5 MG) BY MOUTH DAILY  Dispense: 30 tablet; Refill: 1 -  patient will stay with daughter in [place] X 2 weeks and will not be back in 10 days  2. History of CVA (cerebrovascular accident) -  stable -  continue Coumadin and Atorvastatin  3. Mixed hyperlipidemia Lab Results  Component Value Date   CHOL 141 06/06/2022   HDL 46 (L) 06/06/2022   LDLCALC 76 06/06/2022   TRIG 108 06/06/2022   CHOLHDL 3.1 06/06/2022    -  continue Atorvastatin  4. Prediabetes Lab Results  Component Value Date   HGBA1C  6.1 (H) 06/06/2022   -  takes Jardiance  5. Complete heart block (HCC) -  has pacemaker  6. Seizure disorder (HCC) -  no recent seizure -  continue Keppra, Vimpat -  fall precautions  7. Primary hypertension -  BP 108/68, stable -  continue Toprol XL  8. Candidiasis of breast -  rashes under bilateral breasts - nystatin cream (MYCOSTATIN); Apply 1 Application topically 2 (two) times daily.  Dispense: 30 g; Refill: 0    Labs/tests ordered:  INR  Next appt:  12/05/2022

## 2022-09-12 ENCOUNTER — Encounter: Payer: Self-pay | Admitting: Family Medicine

## 2022-09-12 ENCOUNTER — Ambulatory Visit (INDEPENDENT_AMBULATORY_CARE_PROVIDER_SITE_OTHER): Payer: Medicare HMO | Admitting: Family Medicine

## 2022-09-12 VITALS — BP 102/72 | HR 72 | Temp 96.4°F | Ht 67.0 in | Wt 130.0 lb

## 2022-09-12 DIAGNOSIS — M21371 Foot drop, right foot: Secondary | ICD-10-CM

## 2022-09-12 DIAGNOSIS — I63412 Cerebral infarction due to embolism of left middle cerebral artery: Secondary | ICD-10-CM | POA: Diagnosis not present

## 2022-09-12 NOTE — Progress Notes (Signed)
Provider:  Jacalyn Lefevre, MD  Careteam: Patient Care Team: Valerie Sheffield, MD as PCP - General (Internal Medicine) Valerie Lemming, MD as PCP - Electrophysiology (Cardiology) Valerie Bathe, MD as PCP - Cardiology (Cardiology) Valerie Decamp, MD as Attending Physician (Cardiology) Valerie Paradise, MD (Internal Medicine) Valerie Si, MD as Attending Physician (Cardiology) [name], Valerie Bayley, MD as Consulting Physician (Neurology) [name], Valerie Lew, RN as [facility] Care Management  PLACE OF SERVICE:  [facility]  Advanced Directive information Does Patient Have a Medical Advance Directive?: No, Does patient want to make changes to medical advance directive?: No - Patient declined  Allergies  Allergen Reactions   Lexiscan [Regadenoson] Other (See Comments)    Perioral tingling and throat tightness    Chief Complaint  Patient presents with   Follow-up    3 week follow up . Discuss the need for Shingrix, Colonoscopy, Covid , Flu Vaccine.NCIR verified. Right knee pain.      HPI: Patient is a 72 y.o. female.  This is a follow-up visit.  Had a point-of-care INR 2 weeks ago at 3.5.  According to husband very little change with Coumadin dose. Patient spent a week with her daughter and [place] there was a question about fall but husband spoke to daughter on the phone during visit there was no fall but near fall.  Patient has complained of some pain in her right ankle.  There is been no swelling. Otherwise things have been pretty much status quo.  Patient has rather severe aphasia and almost impossible to understand as well as the right hemiparesis  Review of Systems:  Review of Systems  Constitutional: Negative.   HENT: Negative.         Dentition is poor seems like they are always in process of acquiring dental work  Cardiovascular: Negative.   Gastrointestinal: Negative.   Genitourinary: Negative.   Neurological:  Positive for  speech change and focal weakness.  Psychiatric/Behavioral: Negative.    All other systems reviewed and are negative.   Past Medical History:  Diagnosis Date   Adenomatous polyp of colon 09/2012   repeat colonoscopy in 5 years by Dr. Jarold Motto   CHF (congestive heart failure) (HCC)    EF 15-20% as of 05/28/11 (Dr Casimiro Needle [facility] D/C summary)   CVA (cerebral infarction) 12/2007   H/O heart bypass surgery 2022   Hemiparesis (HCC)    Hyperlipidemia    Hypertension    Seizures (HCC)    Sickle cell anemia (HCC)    Stroke (HCC)    Vitamin D deficiency 2010   Past Surgical History:  Procedure Laterality Date   ABDOMINAL HYSTERECTOMY     BIV PACEMAKER INSERTION CRT-P N/A 04/29/2020   Procedure: BIV PACEMAKER INSERTION CRT-P;  Surgeon: Valerie Lemming, MD;  Location: [facility] INVASIVE CV LAB;  Service: Cardiovascular;  Laterality: N/A;   CORONARY ARTERY BYPASS GRAFT N/A 04/25/2020   Procedure: CORONARY ARTERY BYPASS GRAFTING (CABG) TIMES TWO USING LEFT INTERNAL MAMMARY ARTERY AND RIGHT GREATER SAPHENOUS VEIN HARVESTED ENDOSCOPICALLY;  Surgeon: Loreli Slot, MD;  Location: [facility] OR;  Service: Open Heart Surgery;  Laterality: N/A;   FRACTURE SURGERY     HIP SURGERY     LEFT HEART CATH AND CORONARY ANGIOGRAPHY N/A 04/22/2020   Procedure: LEFT HEART CATH AND CORONARY ANGIOGRAPHY;  Surgeon: Laurey Morale, MD;  Location: [facility] INVASIVE CV LAB;  Service: Cardiovascular;  Laterality: N/A;   TEE WITHOUT CARDIOVERSION  04/25/2020   Procedure: TRANSESOPHAGEAL  ECHOCARDIOGRAM (TEE);  Surgeon: Loreli Slot, MD;  Location: [facility] OR;  Service: Open Heart Surgery;;   TEMPORARY PACEMAKER N/A 04/21/2020   Procedure: TEMPORARY PACEMAKER;  Surgeon: Laurey Morale, MD;  Location: [facility] INVASIVE CV LAB;  Service: Cardiovascular;  Laterality: N/A;   TUBAL LIGATION     Social History:   reports that she quit smoking about 14 years ago. Her smoking use included cigarettes. She has never used smokeless  tobacco. She reports that she does not currently use alcohol. She reports that she does not use drugs.  Family History  Problem Relation Age of Onset   Diabetes Daughter    High Cholesterol Daughter    Hypertension Daughter    Heart disease Daughter    Hypertension Sister    High Cholesterol Sister    Asthma Brother    Bronchitis Brother    Colon cancer Maternal Grandfather     Medications: Patient's Medications  New Prescriptions   No medications on file  Previous Medications   ACETAMINOPHEN (TYLENOL) 325 MG TABLET    Take 650 mg by mouth 2 (two) times a week. As needed   ATORVASTATIN (LIPITOR) 80 MG TABLET    Take 1 tablet (80 mg total) by mouth at bedtime.   CHOLECALCIFEROL 50 MCG (2000 UT) TABS    Take 1 tablet by mouth daily in the afternoon.   EMPAGLIFLOZIN (JARDIANCE) 10 MG TABS TABLET    Take 1 tablet (10 mg total) by mouth daily.   ENSURE (ENSURE)    Take 237 mLs by mouth daily.   FUROSEMIDE (LASIX) 20 MG TABLET    TAKE 1 TABLET(20 MG) BY MOUTH EVERY OTHER DAY   LACOSAMIDE (VIMPAT) 100 MG TABS    Take 1 tablet by mouth every morning and 1 tablet every night at bedtime   LEVETIRACETAM (KEPPRA) 750 MG TABLET    Take 2 tablets (1,500 mg total) by mouth 2 (two) times daily.   METOPROLOL SUCCINATE (TOPROL-XL) 50 MG 24 HR TABLET    Take 1 tablet (50 mg total) by mouth daily. Take with or immediately following a meal.   NYSTATIN CREAM (MYCOSTATIN)    Apply 1 Application topically 2 (two) times daily.   SACUBITRIL-VALSARTAN (ENTRESTO) 97-103 MG    Take 1 tablet by mouth 2 (two) times daily.   SPIRONOLACTONE (ALDACTONE) 25 MG TABLET    TAKE 1 TABLET(25 MG) BY MOUTH DAILY   WARFARIN (COUMADIN) 1 MG TABLET    Take 1 tablet (1 mg total) by mouth daily.   WARFARIN (COUMADIN) 5 MG TABLET    TAKE 1 TABLET(5 MG) BY MOUTH DAILY  Modified Medications   No medications on file  Discontinued Medications   NUTRITIONAL SUPPLEMENTS (NUTRITIONAL SUPPLEMENT PO)    Take 120 mLs by mouth once.  Medpass for weight gain.    Physical Exam:  Vitals:   09/12/22 0912  BP: 102/72  Pulse: 72  Temp: (!) 96.4 F (35.8 C)  TempSrc: Temporal  SpO2: 99%  Weight: 130 lb (59 kg)  Height: 5\' 7"  (1.702 m)   Body mass index is 20.36 kg/m. Wt Readings from Last 3 Encounters:  09/12/22 130 lb (59 kg)  08/23/22 130 lb (59 kg)  04/05/22 130 lb 9.6 oz (59.2 kg)    Physical Exam Nursing note reviewed.  Constitutional:      Appearance: Normal appearance.  Eyes:     Extraocular Movements: Extraocular movements intact.  Cardiovascular:     Rate and Rhythm: Normal rate.  Pulmonary:  Effort: Pulmonary effort is normal.  Abdominal:     General: Bowel sounds are normal.  Neurological:     Mental Status: She is alert.     Comments: Status post CVA right-sided deficits and speech deficit persist  Psychiatric:        Mood and Affect: Mood normal.        Behavior: Behavior normal.     Labs reviewed: Basic Metabolic Panel: Recent Labs    09/12/21 1039 12/28/21 1027 06/06/22 1009  NA 140 136 139  K 4.4 4.5 4.7  CL 108 105 105  CO2 22 23 20   GLUCOSE 88 79 75  BUN 19 22 26*  CREATININE 1.14* 1.11* 1.25*  CALCIUM 9.2 9.2 9.6   Liver Function Tests: Recent Labs    06/06/22 1009  AST 23  ALT 9  BILITOT 0.4  PROT 7.8   No results for input(s): "LIPASE", "AMYLASE" in the last 8760 hours. No results for input(s): "AMMONIA" in the last 8760 hours. CBC: Recent Labs    09/12/21 1039 12/28/21 1027  WBC 5.3 5.0  HGB 12.3 13.2  HCT 38.1 41.0  MCV 89.6 89.7  PLT 259 244   Lipid Panel: Recent Labs    06/06/22 1009  CHOL 141  HDL 46*  LDLCALC 76  TRIG 161  CHOLHDL 3.1   TSH: No results for input(s): "TSH" in the last 8760 hours. A1C: Lab Results  Component Value Date   HGBA1C 6.1 (H) 06/06/2022     Assessment/Plan 1. Cerebrovascular accident (CVA) due to embolism of left middle cerebral artery (HCC) No changes.  Patient does have some pain in her right  lower leg ankle but there is no swelling or deformity  2. Acquired right foot drop Patient unable to flex and extend foot husband inquired about small machine needs seen on TV to exercise the foot we have agreed that might be a good thing to try    Jacalyn Lefevre, MD [facility] [phone]

## 2022-09-17 ENCOUNTER — Ambulatory Visit: Payer: Medicare HMO | Admitting: Podiatry

## 2022-09-17 DIAGNOSIS — R269 Unspecified abnormalities of gait and mobility: Secondary | ICD-10-CM | POA: Diagnosis not present

## 2022-09-17 DIAGNOSIS — M79674 Pain in right toe(s): Secondary | ICD-10-CM

## 2022-09-17 DIAGNOSIS — M25371 Other instability, right ankle: Secondary | ICD-10-CM | POA: Diagnosis not present

## 2022-09-17 DIAGNOSIS — B351 Tinea unguium: Secondary | ICD-10-CM

## 2022-09-17 DIAGNOSIS — M79675 Pain in left toe(s): Secondary | ICD-10-CM

## 2022-09-17 NOTE — Progress Notes (Unsigned)
Subjective:  Patient ID: [name], female    DOB: [birth year],  MRN: [id number]   [name] presents to clinic today for:  Chief Complaint  Patient presents with   RFC    RFC BLOOD THINNER   Patient notes nails are thick, discolored, elongated and painful in shoegear when trying to ambulate.  Patient wears an elastic ankle brace from [employer] on right ankle, but it's very worn and no longer supportive, so they are looking for a replacement that might last longer and provide better support.  She got a custom AFO in the past but did not tolerate it well (too stiff and rigid).  She does not wear that one.  She is in a wheelchair today.  She already has her next appointment scheduled with Dr. Dolores Patty.  PCP is Venita Sheffield, MD.  Last seen 09/12/22.  Past Medical History:  Diagnosis Date   Adenomatous polyp of colon 09/2012   repeat colonoscopy in 5 years by Dr. Jarold Motto   CHF (congestive heart failure) (HCC)    EF 15-20% as of 05/28/11 (Dr Casimiro [facility]le [facility] D[facility] summary)   CVA (cerebral infarction) 12/2007   H/O heart bypass surgery 2022   Hemiparesis (HCC)    Hyperlipidemia    Hypertension    Seizures (HCC)    Sickle cell anemia (HCC)    Stroke (HCC)    Vitamin D deficiency 2010    Allergies  Allergen Reactions   Lexiscan [Regadenoson] Other (See Comments)    Perioral tingling and throat tightness    Review of Systems: Negative except as noted in the HPI.  Objective:  There were no vitals filed for this visit.  [name] is a pleasant 72 y.o. female in NAD. AAO x 3.  Vascular Examination: Capillary refill time is 3-5 seconds to toes bilateral. Palpable pedal pulses b/l LE. Digital hair present b/l.  Skin temperature gradient WNL b/l. No varicosities b/l. No cyanosis noted b/l.   Dermatological Examination: Pedal skin with normal turgor, texture and tone b/l. No open wounds. No interdigital macerations b/l. Toenails x10  are 3mm thick, discolored, dystrophic with subungual debris. There is pain with compression of the nail plates.  They are elongated x10  Neurological Examination: Protective sensation intact bilateral LE.   Musculoskeletal Examination: Muscle strength 5/5 to all LE muscle groups b/l. There is varus rotation of the right ankle/STJ.  This is reducible with manual manipulation.  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     Latest Ref Rng & Units 06/06/2022   10:09 AM  Hemoglobin A1C  Hemoglobin-A1c <5.7 % of total Hgb 6.1    Assessment/Plan: 1. Pain due to onychomycosis of toenails of both feet   2. Instability of ankle joint, right   3. Abnormality of gait    The mycotic toenails were sharply debrided x10 with sterile nail nippers and a power debriding burr to decrease bulk/thickness and length.    Patient was fitted/dispensed a [employer] ankle brace for the right ankle to wear at all times during waking hours, with exception of bathing.    Return for already scheduled with Dr. Reece Agar in 3 months.   Clerance Lav, DPM, FACFAS [facility]     [address]  [place], [place] [address]                Office [phone]  Fax [phone]

## 2022-09-25 ENCOUNTER — Other Ambulatory Visit (HOSPITAL_COMMUNITY): Payer: Self-pay

## 2022-09-25 MED ORDER — FUROSEMIDE 20 MG PO TABS: ORAL_TABLET | ORAL | 0 refills | Status: DC

## 2022-10-03 ENCOUNTER — Ambulatory Visit (INDEPENDENT_AMBULATORY_CARE_PROVIDER_SITE_OTHER): Payer: Medicare HMO | Admitting: Sports Medicine

## 2022-10-03 ENCOUNTER — Encounter: Payer: Self-pay | Admitting: Sports Medicine

## 2022-10-03 VITALS — BP 134/74 | HR 75 | Temp 97.5°F | Resp 16 | Ht 67.0 in | Wt 133.8 lb

## 2022-10-03 DIAGNOSIS — Z1211 Encounter for screening for malignant neoplasm of colon: Secondary | ICD-10-CM

## 2022-10-03 DIAGNOSIS — Z7901 Long term (current) use of anticoagulants: Secondary | ICD-10-CM

## 2022-10-03 DIAGNOSIS — R7303 Prediabetes: Secondary | ICD-10-CM | POA: Diagnosis not present

## 2022-10-03 LAB — CBC WITH DIFFERENTIAL/PLATELET
Absolute Monocytes: 549 {cells}/uL (ref 200–950)
Basophils Absolute: 30 {cells}/uL (ref 0–200)
Basophils Relative: 0.5 %
Eosinophils Absolute: 47 {cells}/uL (ref 15–500)
Eosinophils Relative: 0.8 %
HCT: 38.6 % (ref 35.0–45.0)
Hemoglobin: 12.5 g/dL (ref 11.7–15.5)
Lymphs Abs: 1811 {cells}/uL (ref 850–3900)
MCH: 28.5 pg (ref 27.0–33.0)
MCHC: 32.4 g/dL (ref 32.0–36.0)
MCV: 88.1 fL (ref 80.0–100.0)
MPV: 10.6 fL (ref 7.5–12.5)
Monocytes Relative: 9.3 %
Neutro Abs: 3463 {cells}/uL (ref 1500–7800)
Neutrophils Relative %: 58.7 %
Platelets: 304 10*3/uL (ref 140–400)
RBC: 4.38 10*6/uL (ref 3.80–5.10)
RDW: 12.8 % (ref 11.0–15.0)
Total Lymphocyte: 30.7 %
WBC: 5.9 10*3/uL (ref 3.8–10.8)

## 2022-10-03 LAB — POCT INR: POC INR: 2.8

## 2022-10-03 NOTE — Progress Notes (Signed)
Careteam: Patient Care Team: Venita Sheffield, MD as PCP - General (Internal Medicine) Regan Lemming, MD as PCP - Electrophysiology (Cardiology) Jake Bathe, MD as PCP - Cardiology (Cardiology) Yates Decamp, MD as Attending Physician (Cardiology) Geoffry Paradise, MD (Internal Medicine) Chilton Si, MD as Attending Physician (Cardiology) Marjory Lies, Glenford Bayley, MD as Consulting Physician (Neurology) Clarene Duke, Karma Lew, RN as [facility] Care Management  PLACE OF SERVICE:  [facility]  Advanced Directive information Does Patient Have a Medical Advance Directive?: No, Does patient want to make changes to medical advance directive?: No - Patient declined  Allergies  Allergen Reactions   Lexiscan [Regadenoson] Other (See Comments)    Perioral tingling and throat tightness    Chief Complaint  Patient presents with   Follow-up    3 week INR check. [name] denies any missed dosages for patient.      HPI: Patient is a 72 y.o. female is here for INR check She is accompanied by her husband.  Patient has aphasia from previous stroke.  Husband reports that patient can walk few steps and can dress herself. They have no acute concerns today.  INR 2.8 today Denies bleeding from gums, hematuria, bloody or dark stools she follow-up with neurology in April 2024 recommended follow-up in 1 year History of seizure disorder no recent seizures on Keppra, Vimpat   Review of Systems:  Review of Systems  Constitutional:  Negative for chills and fever.  HENT:  Negative for congestion and sore throat.   Eyes:  Negative for double vision.  Respiratory:  Negative for cough, sputum production and shortness of breath.   Cardiovascular:  Negative for chest pain, palpitations and leg swelling.  Gastrointestinal:  Negative for abdominal pain, heartburn and nausea.  Genitourinary:  Negative for dysuria, frequency and hematuria.  Musculoskeletal:  Negative for  falls and myalgias.  Neurological:  Negative for dizziness.    Past Medical History:  Diagnosis Date   Adenomatous polyp of colon 09/2012   repeat colonoscopy in 5 years by Dr. Jarold Motto   CHF (congestive heart failure) (HCC)    EF 15-20% as of 05/28/11 (Dr [facility] D[facility]C summary)   CVA (cerebral infarction) 12/2007   H/O heart bypass surgery 2022   Hemiparesis (HCC)    Hyperlipidemia    Hypertension    Seizures (HCC)    Sickle cell anemia (HCC)    Stroke (HCC)    Vitamin D deficiency 2010   Past Surgical History:  Procedure Laterality Date   ABDOMINAL HYSTERECTOMY     BIV PACEMAKER INSERTION CRT-P N/A 04/29/2020   Procedure: BIV PACEMAKER INSERTION CRT-P;  Surgeon: Regan Lemming, MD;  Location: [facility] INVASIVE CV LAB;  Service: Cardiovascular;  Laterality: N/A;   CORONARY ARTERY BYPASS GRAFT N/A 04/25/2020   Procedure: CORONARY ARTERY BYPASS GRAFTING (CABG) TIMES TWO USING LEFT INTERNAL MAMMARY ARTERY AND RIGHT GREATER SAPHENOUS VEIN HARVESTED ENDOSCOPICALLY;  Surgeon: Loreli Slot, MD;  Location[facility];  Service: Open Heart Surgery;  Laterality: N/A;   FRACTURE SURGERY     HIP SURGERY     LEFT HEART CATH AND CORONARY ANGIOGRAPHY N/A 04/22/2020   Procedure: LEFT HEART CATH AND CORONARY ANGIOGRAPHY;  Surgeon: Laurey Morale, MD;  Location: [facility] INVASIVE CV LAB;  Service: Cardiovascular;  Laterality: N/A;   TEE WITHOUT CARDIOVERSION  04/25/2020   Procedure: TRANSESOPHAGEAL ECHOCARDIOGRAM (TEE);  Surgeon: Loreli Slot, MD;  Location: [facility] OR;  Service: Open Heart Surgery;;   TEMPORARY PACEMAKER N/A  04/21/2020   Procedure: TEMPORARY PACEMAKER;  Surgeon: Laurey Morale, MD;  Location: [facility] INVASIVE CV LAB;  Service: Cardiovascular;  Laterality: N/A;   TUBAL LIGATION     Social History:   reports that she quit smoking about 15 years ago. Her smoking use included cigarettes. She has never used smokeless tobacco. She reports that she does not currently use alcohol.  She reports that she does not use drugs.  Family History  Problem Relation Age of Onset   Diabetes Daughter    High Cholesterol Daughter    Hypertension Daughter    Heart disease Daughter    Hypertension Sister    High Cholesterol Sister    Asthma Brother    Bronchitis Brother    Colon cancer Maternal Grandfather     Medications: Patient's Medications  New Prescriptions   No medications on file  Previous Medications   ACETAMINOPHEN (TYLENOL) 325 MG TABLET    Take 650 mg by mouth 2 (two) times a week. As needed   ATORVASTATIN (LIPITOR) 80 MG TABLET    Take 1 tablet (80 mg total) by mouth at bedtime.   CHOLECALCIFEROL 50 MCG (2000 UT) TABS    Take 1 tablet by mouth daily in the afternoon.   EMPAGLIFLOZIN (JARDIANCE) 10 MG TABS TABLET    Take 1 tablet (10 mg total) by mouth daily.   ENSURE (ENSURE)    Take 237 mLs by mouth daily.   FUROSEMIDE (LASIX) 20 MG TABLET    TAKE 1 TABLET(20 MG) BY MOUTH EVERY OTHER DAY   LACOSAMIDE (VIMPAT) 100 MG TABS    Take 1 tablet by mouth every morning and 1 tablet every night at bedtime   LEVETIRACETAM (KEPPRA) 750 MG TABLET    Take 2 tablets (1,500 mg total) by mouth 2 (two) times daily.   METOPROLOL SUCCINATE (TOPROL-XL) 50 MG 24 HR TABLET    Take 1 tablet (50 mg total) by mouth daily. Take with or immediately following a meal.   NYSTATIN CREAM (MYCOSTATIN)    Apply 1 Application topically 2 (two) times daily.   SACUBITRIL-VALSARTAN (ENTRESTO) 97-103 MG    Take 1 tablet by mouth 2 (two) times daily.   SPIRONOLACTONE (ALDACTONE) 25 MG TABLET    TAKE 1 TABLET(25 MG) BY MOUTH DAILY   WARFARIN (COUMADIN) 1 MG TABLET    Take 1 tablet (1 mg total) by mouth daily.   WARFARIN (COUMADIN) 5 MG TABLET    TAKE 1 TABLET(5 MG) BY MOUTH DAILY  Modified Medications   No medications on file  Discontinued Medications   No medications on file    Physical Exam:  Vitals:   10/03/22 0937  BP: 134/74  Pulse: 75  Resp: 16  Temp: (!) 97.5 F (36.4 C)  SpO2: 96%   Weight: 133 lb 12.8 oz (60.7 kg)  Height: 5\' 7"  (1.702 m)   Body mass index is 20.96 kg/m. Wt Readings from Last 3 Encounters:  10/03/22 133 lb 12.8 oz (60.7 kg)  09/12/22 130 lb (59 kg)  08/23/22 130 lb (59 kg)    Physical Exam Constitutional:      Appearance: Normal appearance.  HENT:     Head: Normocephalic and atraumatic.  Cardiovascular:     Rate and Rhythm: Normal rate and regular rhythm.     Heart sounds: No murmur heard. Pulmonary:     Effort: Pulmonary effort is normal. No respiratory distress.     Breath sounds: Normal breath sounds. No wheezing.  Abdominal:  General: Bowel sounds are normal. There is no distension.     Tenderness: There is no abdominal tenderness. There is no guarding or rebound.  Musculoskeletal:        General: No swelling or tenderness.  Neurological:     Mental Status: She is alert. Mental status is at baseline.     Comments: Residual weakness from stroke in Rt ue  She is wearing Rt ankle brace     Labs reviewed: Basic Metabolic Panel: Recent Labs    12/28/21 1027 06/06/22 1009  NA 136 139  K 4.5 4.7  CL 105 105  CO2 23 20  GLUCOSE 79 75  BUN 22 26*  CREATININE 1.11* 1.25*  CALCIUM 9.2 9.6   Liver Function Tests: Recent Labs    06/06/22 1009  AST 23  ALT 9  BILITOT 0.4  PROT 7.8   No results for input(s): "LIPASE", "AMYLASE" in the last 8760 hours. No results for input(s): "AMMONIA" in the last 8760 hours. CBC: Recent Labs    12/28/21 1027  WBC 5.0  HGB 13.2  HCT 41.0  MCV 89.7  PLT 244   Lipid Panel: Recent Labs    06/06/22 1009  CHOL 141  HDL 46*  LDLCALC 76  TRIG 191  CHOLHDL 3.1   TSH: No results for input(s): "TSH" in the last 8760 hours. A1C: Lab Results  Component Value Date   HGBA1C 6.1 (H) 06/06/2022     Assessment/Plan     2. Screening for colon cancer  - Cologuard  3. Anticoagulation monitoring,  INR 2.8  Cont with coumadin  Could not find the indication on her chart as  per neurology notes , sent a msg to her neurologist    4. Current use of long term anticoagulation No signs of bleeding - CBC With Differential/Platelet    Return in about 1 month (around 11/02/2022).: next month

## 2022-10-08 ENCOUNTER — Other Ambulatory Visit (HOSPITAL_COMMUNITY): Payer: Self-pay

## 2022-10-08 MED ORDER — METOPROLOL SUCCINATE ER 50 MG PO TB24: 50.0000 mg | ORAL_TABLET | Freq: Every day | ORAL | 3 refills | Status: DC

## 2022-10-15 ENCOUNTER — Other Ambulatory Visit: Payer: Self-pay

## 2022-10-15 ENCOUNTER — Other Ambulatory Visit: Payer: Self-pay | Admitting: Family

## 2022-10-15 MED ORDER — EMPAGLIFLOZIN 10 MG PO TABS: 10.0000 mg | ORAL_TABLET | Freq: Every day | ORAL | 0 refills | Status: DC

## 2022-10-18 ENCOUNTER — Telehealth (HOSPITAL_COMMUNITY): Payer: Self-pay

## 2022-10-18 NOTE — Telephone Encounter (Signed)
Advanced Heart Failure Patient Advocate Encounter  Patient left message regarding Jardiance copay. Spoke to pharmacy, grant is still open and active. Pharmacy was able to reprocess claim and confirmed $0 copay. Informed patient by phone.  Burnell Blanks, CPhT Rx Patient Advocate Phone: [phone]

## 2022-10-23 ENCOUNTER — Telehealth: Payer: Medicare HMO

## 2022-10-23 ENCOUNTER — Other Ambulatory Visit: Payer: Self-pay | Admitting: Family

## 2022-10-23 DIAGNOSIS — Z7901 Long term (current) use of anticoagulants: Secondary | ICD-10-CM

## 2022-10-23 DIAGNOSIS — Z8673 Personal history of transient ischemic attack (TIA), and cerebral infarction without residual deficits: Secondary | ICD-10-CM

## 2022-10-23 MED ORDER — WARFARIN SODIUM 1 MG PO TABS: 1.0000 mg | ORAL_TABLET | Freq: Every day | ORAL | 3 refills | Status: DC

## 2022-10-23 NOTE — Progress Notes (Signed)
ADVANCED HF CLINIC NOTE   PCP: Venita Sheffield, MD HF Cardiology: Dr. Shirlee Latch  72 y.o. with history of CVA, chronic systolic CHF, CHB, and CAD returns for followup of CHF.   She had a stroke in 2009, thought to be cardioembolic.  She has had right hemiparesis and aphasia since that time.   [name] was hospitalized in August 2020 with a seizure after being out of her medications for 4 days.  She required intubation.  High-sensitivity troponin trended to 1905. An echocardiogram 08/2018 revealed EF of 35 to 40% with global hypokinesis and moderate LVH, elevated LVEDP.  She also had moderate AR. Nuclear stress test in August 2020 showed a large severe fixed inferior, anterior apical, and inferolateral perfusion defect suggestive of scar without ischemia.  Study was high risk due to EF estimated at 34% but mentions no significant reversible ischemia. She was deemed not a good candidate for cardiac catheterization during that hospitalization.    Patient was admitted 3/22 following a syncopal episode and was found to be in complete heart block.  This improved initially after stopping nodal blockers.  Echo in 3/22 showed EF 35-40%.  LHC showed significant left main and LCx disease.  She was deemed to be a poor candidate for PCI.  In 4/22, she had CABG with LIMA-LAD and SVG-OM2.  Post-op, she developed CHB again.  St Jude CRT-P device was placed. She was discharged to a rehab facility.   Echo in 9/22 showed EF 35% with basal to mid inferior and inferolateral AK, mildly decreased RV function, mild-moderate MR.    Today she returns for AHF follow up with her husband. Her husband helps provide most of her history with her expressive aphasia. Overall feeling pretty good. Denies palpitations, CP, dizziness, edema, or PND/Orthopnea. No SOB. Appetite good, drinks ensures for more calorie intake. They cook at home, avoid salt. No fever or chills. Does not weight at home but they have noticed her weight going  up at doctors visits recently. Taking all medications. Walks around with cane, able to do ADLs around her house.    Device interrogation (personally reviewed): 99% BiV pacing, no AF, stable thoracic impedance slightly elevated  Labs (4/22): K 3.9, creatinine 0.83, hgb 9.4 Labs (7/22): K 4.2, creatinine 0.93 Labs (9/22): LDL 67 Labs (2/23): K 4.5, creatinine 1.12 Labs (3/23): K 4.5, creatinine 0.96 Labs (5/23): K 4.4, creatinine 1.19, LDL 67, HDL 46 Labs (8/23): K 4.4, creatinine 1.4 Labs (5/24): K 4.7, SCr 1.25, A1c 6.1  ECG: no EKG today  PMH: 1. H/o seizure disorder.  2. CVA: 2009 with aphasia and right hemiparesis.  3. Complete heart block: St Jude CRT-P. 4. Chronic systolic CHF: Ischemic cardiomyopathy. St Jude CRT-P device.  - Echo (3/22): EF 35-40%, inferior and lateral HK, normal RV.  - Coronary angiography (4/22): 60-70% dLM, 60-70% ostial LCx, 99% proximal calcified LCx (LCx was large, dominant vessel).  - Echo (9/22): EF 35% with basal to mid inferior and inferolateral AK, mildly decreased RV function, mild-moderate MR. 5. CAD:  Coronary angiography (4/22) with 60-70% dLM, 60-70% ostial LCx, 99% proximal calcified LCx (LCx was large, dominant vessel). - CABG x 4 in 4/22: LIMA-LAD, SVG-OM2.  6. CHB: St Jude CRT-P placed in 4/22.   Social History   Socioeconomic History   Marital status: Married    Spouse name: [name]   Number of children: 2   Years of education: 12   Highest education level: Not on file  Occupational History  Employer: RETIRED  Tobacco Use   Smoking status: Former    Current packs/day: 0.00    Types: Cigarettes    Quit date: 09/26/2007    Years since quitting: 15.0   Smokeless tobacco: Never  Vaping Use   Vaping status: Never Used  Substance and Sexual Activity   Alcohol use: Not Currently    Comment: Former drinker, 15 years ago   Drug use: Never   Sexual activity: Not Currently  Other Topics Concern   Not on file  Social History  Narrative   Tobacco use, amount per day now: None.   Past tobacco use, amount per day: Everyday.   How many years did you use tobacco: 30 years.   Alcohol use (drinks per week): Wine Daily   Diet:   Do you drink/eat things with caffeine: Yes   Marital status: Married                                  What year were you married?    Do you live in a house, apartment, assisted living, condo, trailer, etc.? House   Is it one or more stories? 1 story.   How many persons live in your home? 2   Do you have pets in your home?( please list) No   Highest Level of education completed? Associate   Current or past profession: Director    Do you exercise?    No                             Type and how often?   Do you have a living will?    Do you have a DNR form?                                   If not, do you want to discuss one?   Do you have signed POA/HPOA forms?                        If so, please bring to you appointment      Do you have any difficulty bathing or dressing yourself? Yes   Do you have any difficulty preparing food or eating? Yes   Do you have any difficulty managing your medications? Yes   Do you have any difficulty managing your finances? Yes   Do you have any difficulty affording your medications? Yes   Social Determinants of Health   Financial Resource Strain: Not on file  Food Insecurity: No Food Insecurity (10/20/2021)   Hunger Vital Sign    Worried About Running Out of Food in the Last Year: Never true    Ran Out of Food in the Last Year: Never true  Transportation Needs: No Transportation Needs (10/20/2021)   PRAPARE - Administrator, Civil Service (Medical): No    Lack of Transportation (Non-Medical): No  Physical Activity: Not on file  Stress: Not on file  Social Connections: Not on file  Intimate Partner Violence: Not on file   Family History  Problem Relation Age of Onset   Diabetes Daughter    High Cholesterol Daughter    Hypertension Daughter     Heart disease Daughter    Hypertension Sister    High Cholesterol Sister  Asthma Brother    Bronchitis Brother    Colon cancer Maternal Grandfather    ROS: All systems reviewed and negative except as per HPI.   Current Outpatient Medications  Medication Sig Dispense Refill   atorvastatin (LIPITOR) 80 MG tablet Take 1 tablet (80 mg total) by mouth at bedtime. 90 tablet 1   Cholecalciferol 50 MCG (2000 UT) TABS Take 1 tablet by mouth daily in the afternoon.     empagliflozin (JARDIANCE) 10 MG TABS tablet Take 1 tablet (10 mg total) by mouth daily. 30 tablet 0   Ensure (ENSURE) Take 237 mLs by mouth daily.     furosemide (LASIX) 20 MG tablet TAKE 1 TABLET(20 MG) BY MOUTH EVERY OTHER DAY 30 tablet 0   Lacosamide (VIMPAT) 100 MG TABS Take 1 tablet by mouth every morning and 1 tablet every night at bedtime 60 tablet 5   levETIRAcetam (KEPPRA) 750 MG tablet Take 2 tablets (1,500 mg total) by mouth 2 (two) times daily. 360 tablet 3   metoprolol succinate (TOPROL-XL) 50 MG 24 hr tablet Take 1 tablet (50 mg total) by mouth daily. Take with or immediately following a meal. 90 tablet 3   sacubitril-valsartan (ENTRESTO) 97-103 MG Take 1 tablet by mouth 2 (two) times daily. 60 tablet 11   spironolactone (ALDACTONE) 25 MG tablet TAKE 1 TABLET(25 MG) BY MOUTH DAILY 30 tablet 11   warfarin (COUMADIN) 1 MG tablet Take 1 tablet (1 mg total) by mouth daily. 30 tablet 3   warfarin (COUMADIN) 5 MG tablet TAKE 1 TABLET(5 MG) BY MOUTH DAILY 30 tablet 1   acetaminophen (TYLENOL) 325 MG tablet Take 650 mg by mouth 2 (two) times a week. As needed     nystatin cream (MYCOSTATIN) Apply 1 Application topically 2 (two) times daily. 30 g 0   No current facility-administered medications for this encounter.   Wt Readings from Last 3 Encounters:  10/26/22 59 kg (130 lb)  10/03/22 60.7 kg (133 lb 12.8 oz)  09/12/22 59 kg (130 lb)   BP 110/60   Pulse 62   Wt 59 kg (130 lb)   SpO2 97%   BMI 20.36 kg/m    Physical Exam:  General:  weak appearing.  No respiratory difficulty. Arrived in [facility] HEENT: normal Neck: supple. JVD ~7 cm. Carotids 2+ bilat; no bruits. No lymphadenopathy or thyromegaly appreciated. Cor: PMI nondisplaced. Regular rate & rhythm. No rubs, gallops or murmurs. Lungs: coarse throughout Abdomen: soft, nontender, nondistended. No hepatosplenomegaly. No bruits or masses. Good bowel sounds. Extremities: no cyanosis, clubbing, rash, edema  Neuro: Expressive aphasia, R side weak.  Asssessment/Plan: 1. Complete heart block: Patient had baseline NSR with LAFB/RBBB, so there was underlying conduction system disease.  She was transiently in CHB pre-CABG and developed persistent CHB post-CABG.  She now has Secondary school teacher CRT-P device.   - She needs follow up with EP.  2. Chronic systolic CHF: Ischemic cardiomyopathy.  Echo in 8/20 with EF 35-40% and wall motion abnormalities. LHC in 4/22 showed left main/severe codominant LCx disease.  Echo in 3/22 with EF 35-40%, inferior and lateral hypokinesis.  Echo in 9/22 with EF 35%, mild RV dysfunction.  Now s/p CABG.  Probably NYHA class II.  Not overly volume overloaded on exam but optivol elevated.  - Continue Toprol XL 50 mg daily.  - Continue Entresto 97/103 bid.   - Continue spironolactone 25 mg daily.    - Continue Jardiance 10 mg daily - Increase Lasix 20 mg every other day>  daily. Will ask device RN to send remote transmission in ~10 days to review fluid.  - BMET/BNP today 3. 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  Has been on warfarin.  Has had long-standing aphasia and right hemiparesis.   - Continue warfarin, off ASA 4. H/o seizure disorder: Continue Keppra.  5. CAD:  LHC showed 60-70% distal left main stenosis, heavily calcified proximal LCx with 60-70% stenosis at the ostium and discrete 99% stenosis in the proximal LCx.  I suspect that her CHB is related to chronic ischemia from LM-LCx disease worsened by nodal blockade. Had CABG x 2  4/22 w/ LIMA-LAD, SVG-OM2.  No chest pain.  - On warfarin with stable CAD - Continue statin, good lipids 5/24. 6. PAD:  - follows with podiatry  Follow up in 2-3 months with Dr. Thressa Sheller AGACNP-BC  10/26/2022

## 2022-10-23 NOTE — Telephone Encounter (Signed)
Patients spouse called stating patient needs a refill on Warfarin 2 mg 1/2 tablet daily and I explained that I could send in for the 1 mg tablet, as that is what is on her list and equivalent to 2 mg, 1/2 tablet daily. Mr.Jagoda verbalized understanding

## 2022-10-25 ENCOUNTER — Other Ambulatory Visit: Payer: Self-pay | Admitting: Adult Health

## 2022-10-25 DIAGNOSIS — Z7901 Long term (current) use of anticoagulants: Secondary | ICD-10-CM

## 2022-10-25 NOTE — Telephone Encounter (Signed)
Patient medication has High Risk Warnings. Medication pend and sent to PCP Venita Sheffield, MD

## 2022-10-26 ENCOUNTER — Ambulatory Visit (HOSPITAL_COMMUNITY)
Admission: RE | Admit: 2022-10-26 | Discharge: 2022-10-26 | Disposition: A | Discharge status: Home / Self Care | Payer: Medicare HMO | Source: Ambulatory Visit | Attending: Internal Medicine | Admitting: Internal Medicine

## 2022-10-26 ENCOUNTER — Encounter (HOSPITAL_COMMUNITY): Payer: Self-pay

## 2022-10-26 ENCOUNTER — Other Ambulatory Visit: Payer: Self-pay | Admitting: Sports Medicine

## 2022-10-26 VITALS — BP 110/60 | HR 62 | Wt 130.0 lb

## 2022-10-26 DIAGNOSIS — I442 Atrioventricular block, complete: Secondary | ICD-10-CM | POA: Diagnosis not present

## 2022-10-26 DIAGNOSIS — I255 Ischemic cardiomyopathy: Secondary | ICD-10-CM | POA: Insufficient documentation

## 2022-10-26 DIAGNOSIS — I5022 Chronic systolic (congestive) heart failure: Secondary | ICD-10-CM

## 2022-10-26 DIAGNOSIS — I69320 Aphasia following cerebral infarction: Secondary | ICD-10-CM | POA: Insufficient documentation

## 2022-10-26 DIAGNOSIS — Z79899 Other long term (current) drug therapy: Secondary | ICD-10-CM | POA: Insufficient documentation

## 2022-10-26 DIAGNOSIS — Z1211 Encounter for screening for malignant neoplasm of colon: Secondary | ICD-10-CM

## 2022-10-26 DIAGNOSIS — G40909 Epilepsy, unspecified, not intractable, without status epilepticus: Secondary | ICD-10-CM | POA: Diagnosis not present

## 2022-10-26 DIAGNOSIS — I739 Peripheral vascular disease, unspecified: Secondary | ICD-10-CM

## 2022-10-26 DIAGNOSIS — Z951 Presence of aortocoronary bypass graft: Secondary | ICD-10-CM | POA: Diagnosis not present

## 2022-10-26 DIAGNOSIS — Z8673 Personal history of transient ischemic attack (TIA), and cerebral infarction without residual deficits: Secondary | ICD-10-CM

## 2022-10-26 DIAGNOSIS — Z1212 Encounter for screening for malignant neoplasm of rectum: Secondary | ICD-10-CM

## 2022-10-26 DIAGNOSIS — I110 Hypertensive heart disease with heart failure: Secondary | ICD-10-CM | POA: Insufficient documentation

## 2022-10-26 DIAGNOSIS — Z7984 Long term (current) use of oral hypoglycemic drugs: Secondary | ICD-10-CM | POA: Insufficient documentation

## 2022-10-26 DIAGNOSIS — I2510 Atherosclerotic heart disease of native coronary artery without angina pectoris: Secondary | ICD-10-CM | POA: Diagnosis not present

## 2022-10-26 DIAGNOSIS — I69351 Hemiplegia and hemiparesis following cerebral infarction affecting right dominant side: Secondary | ICD-10-CM | POA: Insufficient documentation

## 2022-10-26 LAB — BRAIN NATRIURETIC PEPTIDE: B Natriuretic Peptide: 558.1 pg/mL — ABNORMAL HIGH (ref 0.0–100.0)

## 2022-10-26 LAB — BASIC METABOLIC PANEL
Anion gap: 13 (ref 5–15)
BUN: 22 mg/dL (ref 8–23)
CO2: 21 mmol/L — ABNORMAL LOW (ref 22–32)
Calcium: 8.8 mg/dL — ABNORMAL LOW (ref 8.9–10.3)
Chloride: 105 mmol/L (ref 98–111)
Creatinine, Ser: 1.25 mg/dL — ABNORMAL HIGH (ref 0.44–1.00)
GFR, Estimated: 46 mL/min — ABNORMAL LOW (ref >60)
Glucose, Bld: 78 mg/dL (ref 70–99)
Potassium: 4.6 mmol/L (ref 3.5–5.1)
Sodium: 139 mmol/L (ref 135–145)

## 2022-10-26 MED ORDER — FUROSEMIDE 20 MG PO TABS: 20.0000 mg | ORAL_TABLET | Freq: Every day | ORAL | 3 refills | Status: DC

## 2022-10-26 NOTE — Patient Instructions (Signed)
Medication Changes:  INCREASE LASIX (FUROSEMIDE) TO 20MG  ONCE DAILY   Lab Work:  Labs done today, your results will be available in MyChart, we will contact you for abnormal readings.  Follow-Up in: 2-3 MONTHS WITH APP AS SCHEDULED   At the [facility], you and your health needs are our priority. We have a designated team specialized in the treatment of Heart Failure. This Care Team includes your primary Heart Failure Specialized Cardiologist (physician), Advanced Practice Providers (APPs- Physician Assistants and Nurse Practitioners), and Pharmacist who all work together to provide you with the care you need, when you need it.   You may see any of the following providers on your designated Care Team at your next follow up:  Dr. Arvilla Meres Dr. Marca Ancona Dr. Dorthula Nettles Dr. Theresia Bough Tonye Becket, NP Robbie Lis, [place] [facility] [place], [place] Brynda Peon, NP [place] Lee, NP Karle Plumber, PharmD  Please be sure to bring in all your medications bottles to every appointment.   Need to Contact us:  If you have any questions or concerns before your next appointment please send [place] a message through [place] or call our office at [phone].    TO LEAVE A MESSAGE FOR THE NURSE SELECT OPTION 2, PLEASE LEAVE A MESSAGE INCLUDING: YOUR NAME DATE OF BIRTH CALL BACK NUMBER REASON FOR CALL**this is important as we prioritize the call backs  YOU WILL RECEIVE A CALL BACK THE SAME DAY AS LONG AS YOU CALL BEFORE 4:00 PM

## 2022-10-29 ENCOUNTER — Ambulatory Visit (INDEPENDENT_AMBULATORY_CARE_PROVIDER_SITE_OTHER): Payer: Medicare HMO

## 2022-10-29 DIAGNOSIS — I442 Atrioventricular block, complete: Secondary | ICD-10-CM | POA: Diagnosis not present

## 2022-10-30 LAB — CUP PACEART REMOTE DEVICE CHECK
Battery Remaining Longevity: 60 mo
Battery Remaining Percentage: 63 %
Battery Voltage: 2.99 V
Brady Statistic AP VP Percent: 15 %
Brady Statistic AP VS Percent: 1 %
Brady Statistic AS VP Percent: 83 %
Brady Statistic AS VS Percent: 1 %
Brady Statistic RA Percent Paced: 14 %
Date Time Interrogation Session: 20241007020010
Implantable Lead Connection Status: 753985
Implantable Lead Connection Status: 753985
Implantable Lead Connection Status: 753985
Implantable Lead Implant Date: 20220411
Implantable Lead Implant Date: 20220411
Implantable Lead Implant Date: 20220411
Implantable Lead Location: 753858
Implantable Lead Location: 753859
Implantable Lead Location: 753860
Implantable Pulse Generator Implant Date: 20220411
Lead Channel Impedance Value: 350 Ohm
Lead Channel Impedance Value: 430 Ohm
Lead Channel Impedance Value: 550 Ohm
Lead Channel Pacing Threshold Amplitude: 0.625 V
Lead Channel Pacing Threshold Amplitude: 0.75 V
Lead Channel Pacing Threshold Amplitude: 1 V
Lead Channel Pacing Threshold Pulse Width: 0.5 ms
Lead Channel Pacing Threshold Pulse Width: 0.5 ms
Lead Channel Pacing Threshold Pulse Width: 0.5 ms
Lead Channel Sensing Intrinsic Amplitude: 3.3 mV
Lead Channel Sensing Intrinsic Amplitude: 5.4 mV
Lead Channel Setting Pacing Amplitude: 2 V
Lead Channel Setting Pacing Amplitude: 2 V
Lead Channel Setting Pacing Amplitude: 2 V
Lead Channel Setting Pacing Pulse Width: 0.5 ms
Lead Channel Setting Pacing Pulse Width: 0.5 ms
Lead Channel Setting Sensing Sensitivity: 4 mV
Pulse Gen Model: 3562
Pulse Gen Serial Number: 6262141

## 2022-11-06 ENCOUNTER — Ambulatory Visit (INDEPENDENT_AMBULATORY_CARE_PROVIDER_SITE_OTHER): Payer: Medicare HMO | Admitting: Sports Medicine

## 2022-11-06 ENCOUNTER — Ambulatory Visit: Payer: Medicare HMO | Attending: Cardiology

## 2022-11-06 ENCOUNTER — Encounter: Payer: Self-pay | Admitting: Sports Medicine

## 2022-11-06 VITALS — BP 106/60 | HR 63 | Temp 97.5°F | Resp 16 | Ht 67.0 in | Wt 137.0 lb

## 2022-11-06 DIAGNOSIS — I5022 Chronic systolic (congestive) heart failure: Secondary | ICD-10-CM | POA: Diagnosis not present

## 2022-11-06 DIAGNOSIS — Z950 Presence of cardiac pacemaker: Secondary | ICD-10-CM | POA: Diagnosis not present

## 2022-11-06 DIAGNOSIS — I429 Cardiomyopathy, unspecified: Secondary | ICD-10-CM

## 2022-11-06 DIAGNOSIS — Z9189 Other specified personal risk factors, not elsewhere classified: Secondary | ICD-10-CM | POA: Diagnosis not present

## 2022-11-06 DIAGNOSIS — Z7901 Long term (current) use of anticoagulants: Secondary | ICD-10-CM | POA: Diagnosis not present

## 2022-11-06 DIAGNOSIS — R220 Localized swelling, mass and lump, head: Secondary | ICD-10-CM

## 2022-11-06 LAB — POCT INR: POC INR: 3.5

## 2022-11-06 MED ORDER — AMOXICILLIN-POT CLAVULANATE 500-125 MG PO TABS
1.0000 | ORAL_TABLET | Freq: Two times a day (BID) | ORAL | 0 refills | Status: DC
Start: 2022-11-06 — End: 2023-01-25

## 2022-11-06 NOTE — Progress Notes (Addendum)
Careteam: Patient Care Team: Venita Sheffield, MD as PCP - General (Internal Medicine) Regan Lemming, MD as PCP - Electrophysiology (Cardiology) Jake Bathe, MD as PCP - Cardiology (Cardiology) Yates Decamp, MD as Attending Physician (Cardiology) Geoffry Paradise, MD (Internal Medicine) Chilton Si, MD as Attending Physician (Cardiology) Marjory Lies, Glenford Bayley, MD as Consulting Physician (Neurology) Clarene Duke, Karma Lew, RN as [facility] Care Management  PLACE OF SERVICE:  [facility]  Advanced Directive information Does Patient Have a Medical Advance Directive?: No, Would patient like information on creating a medical advance directive?: No - Patient declined, Does patient want to make changes to medical advance directive?: No - Patient declined  Allergies  Allergen Reactions   Lexiscan [Regadenoson] Other (See Comments)    Perioral tingling and throat tightness    Chief Complaint  Patient presents with   Medical Management of Chronic Issues    1 month INR check.      HPI: Patient is a 73 y.o. female is here for INR check  H/o Cardiomyopathy  Multiple strokes  On anticoagulation  INR 3.5  Denies no signs of bleeding  Currently taking coumadin 6 mg daily  Husband states he could not afford eliquis   As per cardiology Laurey Morale, MD  Venita Sheffield, MD Stroke with chronic severe cardiomyopathy.  Suspect cardioembolic stroke.  Leave warfarin unless has some issue with bleeding. Could switch to Eliquis for ease.       Previous Messages   Poor dental hygiene C/o swelling on rt Maxillary area  Pt is a poor historian, her speech is difficult to understand  Review of Systems:  Review of Systems  Unable to perform ROS: Patient nonverbal  Constitutional:  Negative for fever.  Respiratory:  Negative for cough and shortness of breath.   Cardiovascular:  Negative for chest pain and leg swelling.  Gastrointestinal:  Negative for  abdominal pain, blood in stool, nausea and vomiting.  Genitourinary:  Negative for hematuria.  Musculoskeletal:  Negative for falls.     Past Medical History:  Diagnosis Date   Adenomatous polyp of colon 09/2012   repeat colonoscopy in 5 years by Dr. Jarold Motto   CHF (congestive heart failure) (HCC)    EF 15-20% as of 05/28/11 (Dr Casimiro [facility]le [facility] D/C summary)   CVA (cerebral infarction) 12/2007   H/O heart bypass surgery 2022   Hemiparesis (HCC)    Hyperlipidemia    Hypertension    Seizures (HCC)    Sickle cell anemia (HCC)    Stroke (HCC)    Vitamin D deficiency 2010   Past Surgical History:  Procedure Laterality Date   ABDOMINAL HYSTERECTOMY     BIV PACEMAKER INSERTION CRT-P N/A 04/29/2020   Procedure: BIV PACEMAKER INSERTION CRT-P;  Surgeon: Regan Lemming, MD;  Location: [facility] INVASIVE CV LAB;  Service: Cardiovascular;  Laterality: N/A;   CORONARY ARTERY BYPASS GRAFT N/A 04/25/2020   Procedure: CORONARY ARTERY BYPASS GRAFTING (CABG) TIMES TWO USING LEFT INTERNAL MAMMARY ARTERY AND RIGHT GREATER SAPHENOUS VEIN HARVESTED ENDOSCOPICALLY;  Surgeon: Loreli Slot, MD;  Location[facility];  Service: Open Heart Surgery;  Laterality: N/A;   FRACTURE SURGERY     HIP SURGERY     LEFT HEART CATH AND CORONARY ANGIOGRAPHY N/A 04/22/2020   Procedure: LEFT HEART CATH AND CORONARY ANGIOGRAPHY;  Surgeon: Laurey Morale, MD;  Location: [facility] INVASIVE CV LAB;  Service: Cardiovascular;  Laterality: N/A;   TEE WITHOUT CARDIOVERSION  04/25/2020   Procedure: TRANSESOPHAGEAL ECHOCARDIOGRAM (TEE);  Surgeon: Loreli Slot, MD;  Location: [facility] OR;  Service: Open Heart Surgery;;   TEMPORARY PACEMAKER N/A 04/21/2020   Procedure: TEMPORARY PACEMAKER;  Surgeon: Laurey Morale, MD;  Location: [facility] INVASIVE CV LAB;  Service: Cardiovascular;  Laterality: N/A;   TUBAL LIGATION     Social History:   reports that she quit smoking about 15 years ago. Her smoking use included cigarettes. She has never  used smokeless tobacco. She reports that she does not currently use alcohol. She reports that she does not use drugs.  Family History  Problem Relation Age of Onset   Diabetes Daughter    High Cholesterol Daughter    Hypertension Daughter    Heart disease Daughter    Hypertension Sister    High Cholesterol Sister    Asthma Brother    Bronchitis Brother    Colon cancer Maternal Grandfather     Medications: Patient's Medications  New Prescriptions   No medications on file  Previous Medications   ACETAMINOPHEN (TYLENOL) 325 MG TABLET    Take 650 mg by mouth 2 (two) times a week. As needed   ATORVASTATIN (LIPITOR) 80 MG TABLET    Take 1 tablet (80 mg total) by mouth at bedtime.   CHOLECALCIFEROL 50 MCG (2000 UT) TABS    Take 1 tablet by mouth daily in the afternoon.   EMPAGLIFLOZIN (JARDIANCE) 10 MG TABS TABLET    Take 1 tablet (10 mg total) by mouth daily.   ENSURE (ENSURE)    Take 237 mLs by mouth daily.   FUROSEMIDE (LASIX) 20 MG TABLET    Take 1 tablet (20 mg total) by mouth daily.   LACOSAMIDE (VIMPAT) 100 MG TABS    Take 1 tablet by mouth every morning and 1 tablet every night at bedtime   LEVETIRACETAM (KEPPRA) 750 MG TABLET    Take 2 tablets (1,500 mg total) by mouth 2 (two) times daily.   METOPROLOL SUCCINATE (TOPROL-XL) 50 MG 24 HR TABLET    Take 1 tablet (50 mg total) by mouth daily. Take with or immediately following a meal.   NYSTATIN CREAM (MYCOSTATIN)    Apply 1 Application topically 2 (two) times daily.   SACUBITRIL-VALSARTAN (ENTRESTO) 97-103 MG    Take 1 tablet by mouth 2 (two) times daily.   SPIRONOLACTONE (ALDACTONE) 25 MG TABLET    TAKE 1 TABLET(25 MG) BY MOUTH DAILY   WARFARIN (COUMADIN) 1 MG TABLET    Take 1 tablet (1 mg total) by mouth daily.   WARFARIN (COUMADIN) 5 MG TABLET    TAKE 1 TABLET(5 MG) BY MOUTH DAILY  Modified Medications   No medications on file  Discontinued Medications   No medications on file    Physical Exam:  There were no vitals filed  for this visit. There is no height or weight on file to calculate BMI. Wt Readings from Last 3 Encounters:  10/26/22 130 lb (59 kg)  10/03/22 133 lb 12.8 oz (60.7 kg)  09/12/22 130 lb (59 kg)    Physical Exam Constitutional:      Appearance: Normal appearance.  HENT:     Head: Normocephalic and atraumatic.  Cardiovascular:     Rate and Rhythm: Normal rate and regular rhythm.     Heart sounds: No murmur heard. Pulmonary:     Effort: Pulmonary effort is normal. No respiratory distress.     Breath sounds: Normal breath sounds. No wheezing.  Abdominal:     General: Bowel sounds are normal. There is  no distension.     Tenderness: There is no abdominal tenderness. There is no guarding or rebound.  Musculoskeletal:        General: No swelling or tenderness.  Skin:    General: Skin is dry.  Neurological:     Mental Status: She is alert. Mental status is at baseline.     Comments: Residual weakness on Rt lower extremity       Labs reviewed: Basic Metabolic Panel: Recent Labs    12/28/21 1027 06/06/22 1009 10/26/22 0929  NA 136 139 139  K 4.5 4.7 4.6  CL 105 105 105  CO2 23 20 21*  GLUCOSE 79 75 78  BUN 22 26* 22  CREATININE 1.11* 1.25* 1.25*  CALCIUM 9.2 9.6 8.8*   Liver Function Tests: Recent Labs    06/06/22 1009  AST 23  ALT 9  BILITOT 0.4  PROT 7.8   No results for input(s): "LIPASE", "AMYLASE" in the last 8760 hours. No results for input(s): "AMMONIA" in the last 8760 hours. CBC: Recent Labs    12/28/21 1027 10/03/22 1010  WBC 5.0 5.9  NEUTROABS  --  3,463  HGB 13.2 12.5  HCT 41.0 38.6  MCV 89.7 88.1  PLT 244 304   Lipid Panel: Recent Labs    06/06/22 1009  CHOL 141  HDL 46*  LDLCALC 76  TRIG 644  CHOLHDL 3.1   TSH: No results for input(s): "TSH" in the last 8760 hours. A1C: Lab Results  Component Value Date   HGBA1C 6.1 (H) 06/06/2022     Assessment/Plan  1. Anticoagulation monitoring,    Instructed to take coumadin 3.5 mg  today  Resume with 6 mg from tomorrow  Repeat INR in a month Monitor for signs of bleeding   2. Secondary cardiomyopathy (HCC)  Lungs clear No lower extremity swelling Instructed to follow up with cardiology  3. Poor dental hygiene   - Ambulatory referral to Dentistry - amoxicillin-clavulanate (AUGMENTIN) 500-125 MG tablet; Take 1 tablet by mouth in the morning and at bedtime.  Dispense: 10 tablet; Refill: 0  4. Right facial swelling   - Ambulatory referral to Dentistry - amoxicillin-clavulanate (AUGMENTIN) 500-125 MG tablet; Take 1 tablet by mouth in the morning and at bedtime.  Dispense: 10 tablet; Refill: 0   No follow-ups on file.:

## 2022-11-06 NOTE — Patient Instructions (Signed)
Take 1/2 tab  of  warfarin 5 mg tab  Cont with 1 mg coumadin   Tomorrow resume with 6 mg warfarin  Monitor for signs of bleeding

## 2022-11-07 ENCOUNTER — Telehealth: Payer: Self-pay

## 2022-11-07 NOTE — Telephone Encounter (Signed)
Remote ICM transmission received.  Attempted call to patient regarding ICM remote transmission and no answer or voice mail box.

## 2022-11-07 NOTE — Progress Notes (Signed)
EPIC Encounter for ICM Monitoring  Patient Name: [name] is a 72 y.o. female Date: 11/07/2022 Primary Care Physican: Venita Sheffield, MD Primary Cardiologist: Shirlee Latch Electrophysiologist: Elberta Fortis Bi-V Pacing: 99%  10/26/2022 Office Weight: 130 lbs        ICM check for HF Clinic follow up.   Attempted call to patient and unable to reach.  Transmission reviewed.    CorVue thoracic impedance suggesting normal fluid levels starting 10/11.  Suggesting possible dryness from 10/5-10/10 but has returned to normal.   Prescribed:  Furosemide 20 mg take 1 tablet(s) (20 mg total) by mouth daily. Spironolactone 25 mg take 1 tablet daily  Labs: 10/26/2022 Creatinine 1.25, BUN 22, Potassium 4.6, Sodium 139, GFR 46  A complete set of results can be found in Results Review.  Recommendations: Unable to reach.  Copy sent to Brynda Peon, NP as requested as a 1 time HF clinic following 10/4 OV.  Follow-up plan: No further ICM clinic phone appointments at this time.   91 day device clinic remote transmission 01/28/2023.    EP/Cardiology Office Visits: 11/15/2022 with Dr. Anne Fu.  12/27/2022 with HF clinic.  Copy of ICM check sent to Dr. Elberta Fortis.   3 month ICM trend: 11/06/2022.    12-14 Month ICM trend:     Karie Soda, RN 11/07/2022 8:31 AM

## 2022-11-13 NOTE — Progress Notes (Signed)
  Received: Marni Griffon, Ivar Drape, NP  Jessicah Croll, Josephine Igo, RN Thank so much!

## 2022-11-14 NOTE — Progress Notes (Signed)
Remote pacemaker transmission.   

## 2022-11-15 ENCOUNTER — Ambulatory Visit: Payer: Medicare HMO | Attending: Cardiology | Admitting: Cardiology

## 2022-11-15 ENCOUNTER — Encounter: Payer: Self-pay | Admitting: Cardiology

## 2022-11-15 VITALS — BP 108/56 | HR 73 | Ht 67.0 in

## 2022-11-15 DIAGNOSIS — Z950 Presence of cardiac pacemaker: Secondary | ICD-10-CM | POA: Diagnosis not present

## 2022-11-15 DIAGNOSIS — I5022 Chronic systolic (congestive) heart failure: Secondary | ICD-10-CM

## 2022-11-15 DIAGNOSIS — I10 Essential (primary) hypertension: Secondary | ICD-10-CM

## 2022-11-15 NOTE — Patient Instructions (Signed)

## 2022-11-15 NOTE — Progress Notes (Signed)
Cardiology Office Note:  .   Date:  11/15/2022  ID:  [name], Park Meo [birth year], MRN [id number] PCP: Venita Sheffield, MD  [facility] Providers Cardiologist:  Donato Schultz, MD Electrophysiologist:  Will Jorja Loa, MD     History of Present Illness: .   [name] is a 72 y.o. female Discussed the use of AI scribe  History of Present Illness    The patient is a 72 year old female with a history of chronic systolic heart failure, complete heart block, and a prior stroke in 2009. She was recently seen by the advanced heart failure clinic. The patient underwent a CABG x4 in April 2022, and has been on warfarin since her stroke, which was thought to be embolic. Her ejection fraction on an echocardiogram in 2022 was 35% with mild RV dysfunction. She is currently on Toprol XL 50mg  daily, Entresto 97/103mg  BID, spironolactone 25mg  daily, Jardiance 10mg  daily, and Lasix 20mg  daily. She has a [employer] CRT pacer device due to persistent complete heart block post-CABG. Her most recent echocardiogram in December 2023 showed an EF of 25-30% with severe tricuspid regurgitation, mild to moderate aortic regurgitation, and moderately reduced RV function.  The patient's husband, who assists with providing history due to the patient's expressive aphasia, raised a question about the continued use of warfarin. The patient's primary care doctor had also questioned the necessity of this medication. The patient's current health status appears stable, with no new symptoms reported during the consultation.            Studies Reviewed: Marland Kitchen   EKG Interpretation Date/Time:  Thursday November 15 2022 10:14:14 EDT Ventricular Rate:  73 PR Interval:  184 QRS Duration:  158 QT Interval:  472 QTC Calculation: 519 R Axis:   -62  Text Interpretation: Atrial-sensed ventricular-paced rhythm Biventricular pacemaker detected When compared with ECG of 28-Dec-2021 10:01, Premature  ventricular complexes are no longer Present Vent. rate has increased BY   6 BPM Confirmed by Donato Schultz ([id number]) on 11/15/2022 10:19:46 AM    Results DIAGNOSTIC Echocardiogram: EF 25-30%, severe tricuspid regurgitation, mild to moderate aortic regurgitation, moderately reduced RV function (01/10/2022)  Risk Assessment/Calculations:            Physical Exam:   VS:  BP (!) 108/56   Pulse 73   Ht 5\' 7"  (1.702 m)   SpO2 96%   BMI 21.46 kg/m    Wt Readings from Last 3 Encounters:  11/06/22 137 lb (62.1 kg)  10/26/22 130 lb (59 kg)  10/03/22 133 lb 12.8 oz (60.7 kg)    GEN: Aphasic, in wheelchair, no use of right arm NECK: No JVD; No carotid bruits CARDIAC: RRR, no murmurs, no rubs, no gallops RESPIRATORY:  Clear to auscultation without rales, wheezing or rhonchi  ABDOMEN: Soft, non-tender, non-distended EXTREMITIES:  No edema; No deformity   ASSESSMENT AND PLAN: .    Assessment and Plan    Chronic Systolic Heart Failure Stable with EF 25-30% and severe tricuspid regurgitation. On Toprol XL 50mg  daily, Entresto 97/103mg  BID, Spironolactone 25mg  daily, Jardiance 10mg  daily, and Lasix 20mg  daily. CRT pacer device in place. -Continue current medications and follow-up with advanced heart failure clinic.  Anticoagulation for Stroke Prevention History of stroke in 2009, thought to be embolic. Currently on Warfarin. No evidence of atrial fibrillation on device ([employer]).   Copy of discharge summary from 2009, Dr. Pearlean Brownie:  Her stroke was felt to  be embolic secondary to ischemic cardiomyopathy.  [facility] was consulted to perform a TEE to rule out other thrombotic  source as well as to adjust with her medications.  Of note, the patient  did have elevated troponins during her initial hospital stay with  fluctuating periods of apnea.  It was not felt that this was a cardiac  ischemic event.  The patient was moved to the step-down unit for close  monitoring.   She quickly returned to the floor even though she had some  mild congestive heart failure that was improving.  The patient was  placed on Coumadin for low EF for and for secondary stroke prevention.  Transesophageal echocardiogram shows EF of 40-45% with mild diffuse      left ventricular hypokinesis, arch atheroma, left atrial appendage      without thrombus identified.  13.  EKG shows sinus rhythm with      short PR, left axis deviation, right bundle branch block and T-wave      abnormality.  Repeat 2-D echocardiogram 24 hours later showed EF in the 15-20%      range consistent with nondilated cardiomyopathy and severe global      hypokinesis.  No cardiac embolus was seen.  No significant changes      from prior exam.   Discussed the risk/benefit of continuing Warfarin given the risk of bleeding and potential for another stroke if discontinued, however Plavix or ASA may be a reasonable alternative given her current status.  Discussed with neurology, Dr. Danae Orleans.  I will forward my note to him and he will also discuss with Dr. Pearlean Brownie.    Follow-up in 1 year.     45 min spent with patient, review of data, review of old medical records, discussion with neurology.          Signed, Donato Schultz, MD

## 2022-11-16 ENCOUNTER — Telehealth (HOSPITAL_COMMUNITY): Payer: Self-pay

## 2022-11-16 NOTE — Telephone Encounter (Signed)
Advanced Heart Failure Patient Advocate Encounter  The patient was renewed for a [employer] grant that will help cover the cost of Entresto, Jardiance, Metoprolol, Spironolactone.  Total amount awarded, $10,000.  Effective: 10/17/2022 - 10/16/2023.  BIN [id number] PCN [id number] [id number] ID [id number]  Pharmacy provided with approval and processing information. Patients spouse informed via phone.  Burnell Blanks, CPhT Rx Patient Advocate Phone: [phone]

## 2022-11-20 ENCOUNTER — Encounter: Payer: Self-pay | Admitting: Internal Medicine

## 2022-11-27 ENCOUNTER — Ambulatory Visit: Payer: Medicare HMO | Admitting: Sports Medicine

## 2022-12-05 ENCOUNTER — Encounter: Payer: Self-pay | Admitting: Sports Medicine

## 2022-12-05 ENCOUNTER — Ambulatory Visit: Payer: Medicare HMO | Admitting: Sports Medicine

## 2022-12-05 VITALS — BP 108/70 | HR 61 | Temp 96.6°F | Resp 16 | Ht 67.0 in

## 2022-12-05 DIAGNOSIS — Z7901 Long term (current) use of anticoagulants: Secondary | ICD-10-CM | POA: Diagnosis not present

## 2022-12-05 DIAGNOSIS — I429 Cardiomyopathy, unspecified: Secondary | ICD-10-CM | POA: Diagnosis not present

## 2022-12-05 DIAGNOSIS — Z23 Encounter for immunization: Secondary | ICD-10-CM

## 2022-12-05 DIAGNOSIS — Z9189 Other specified personal risk factors, not elsewhere classified: Secondary | ICD-10-CM | POA: Diagnosis not present

## 2022-12-05 LAB — POCT INR: POC INR: 3.1 — AB

## 2022-12-05 NOTE — Patient Instructions (Signed)
Take 1/2 tab of 5 mg of coumadin  Take 1 mg coumadin today   Total dose today 2.5 +1= 3.5 mg  Resume 6 mg from tomorrow

## 2022-12-05 NOTE — Progress Notes (Signed)
Careteam: Patient Care Team: Venita Sheffield, MD as PCP - General (Internal Medicine) Regan Lemming, MD as PCP - Electrophysiology (Cardiology) Jake Bathe, MD as PCP - Cardiology (Cardiology) Yates Decamp, MD as Attending Physician (Cardiology) Geoffry Paradise, MD (Internal Medicine) Chilton Si, MD as Attending Physician (Cardiology) Marjory Lies, Glenford Bayley, MD as Consulting Physician (Neurology) Clarene Duke, Karma Lew, RN as [facility] Care Management  PLACE OF SERVICE:  [facility]  Advanced Directive information Does Patient Have a Medical Advance Directive?: No, Does patient want to make changes to medical advance directive?: No - Patient declined  Allergies  Allergen Reactions   Lexiscan [Regadenoson] Other (See Comments)    Perioral tingling and throat tightness    Chief Complaint  Patient presents with   Medical Management of Chronic Issues    6 month follow up. INC Check. Denies any missed doses.    Immunizations    Discuss the need for Shingrix vaccine, and Covid Booster.    Health Maintenance    Discuss the need for Cologuard.      HPI: Patient is a 72 y.o. female is here for follow up  Accompanied by her husband  Recently followed by cardiology  Chronic Systolic Heart Failure EF  25-30% On Toprol XL 50mg  daily, Entresto 97/103mg  BID, Spironolactone 25mg  daily, Jardiance 10mg  daily, and Lasix 20mg  daily   Ambulates with a walker No recent falls No problems with her balance as per husband   H/O STROKE  On coumadin  She was put on coumadin as per Neurology at the time of diagnosis of stroke  INR 3.1 She is on 6 mg daily      Review of Systems:  Review of Systems  Unable to perform ROS: Age  Constitutional:  Negative for fever.       Pt is a poor historian, most of the history is obtained from husband  Pt has dysarthria from previous stroke  HENT:  Negative for sore throat.        Poor oral hygiene  Respiratory:   Negative for cough, sputum production and shortness of breath.   Cardiovascular:  Negative for chest pain, palpitations and leg swelling.  Gastrointestinal:  Negative for abdominal pain, heartburn and nausea.  Genitourinary:  Negative for dysuria and hematuria.  Musculoskeletal:  Negative for falls and myalgias.  Neurological:  Negative for dizziness.     Past Medical History:  Diagnosis Date   Adenomatous polyp of colon 09/2012   repeat colonoscopy in 5 years by Dr. Jarold Motto   CHF (congestive heart failure) (HCC)    EF 15-20% as of 05/28/11 (Dr Casimiro [facility]le [facility] D[facility] summary)   CVA (cerebral infarction) 12/2007   H/O heart bypass surgery 2022   Hemiparesis (HCC)    Hyperlipidemia    Hypertension    Seizures (HCC)    Sickle cell anemia (HCC)    Stroke (HCC)    Vitamin D deficiency 2010   Past Surgical History:  Procedure Laterality Date   ABDOMINAL HYSTERECTOMY     BIV PACEMAKER INSERTION CRT-P N/A 04/29/2020   Procedure: BIV PACEMAKER INSERTION CRT-P;  Surgeon: Regan Lemming, MD;  Location: [facility] INVASIVE CV LAB;  Service: Cardiovascular;  Laterality: N/A;   CORONARY ARTERY BYPASS GRAFT N/A 04/25/2020   Procedure: CORONARY ARTERY BYPASS GRAFTING (CABG) TIMES TWO USING LEFT INTERNAL MAMMARY ARTERY AND RIGHT GREATER SAPHENOUS VEIN HARVESTED ENDOSCOPICALLY;  Surgeon: Loreli Slot, MD;  Location: [facility] OR;  Service: Open Heart Surgery;  Laterality: N/A;  FRACTURE SURGERY     HIP SURGERY     LEFT HEART CATH AND CORONARY ANGIOGRAPHY N/A 04/22/2020   Procedure: LEFT HEART CATH AND CORONARY ANGIOGRAPHY;  Surgeon: Laurey Morale, MD;  Location: [facility] INVASIVE CV LAB;  Service: Cardiovascular;  Laterality: N/A;   TEE WITHOUT CARDIOVERSION  04/25/2020   Procedure: TRANSESOPHAGEAL ECHOCARDIOGRAM (TEE);  Surgeon: Loreli Slot, MD;  Location: [facility] OR;  Service: Open Heart Surgery;;   TEMPORARY PACEMAKER N/A 04/21/2020   Procedure: TEMPORARY PACEMAKER;  Surgeon: Laurey Morale,  MD;  Location: [facility] INVASIVE CV LAB;  Service: Cardiovascular;  Laterality: N/A;   TUBAL LIGATION     Social History:   reports that she quit smoking about 15 years ago. Her smoking use included cigarettes. She has never used smokeless tobacco. She reports that she does not currently use alcohol. She reports that she does not use drugs.  Family History  Problem Relation Age of Onset   Diabetes Daughter    High Cholesterol Daughter    Hypertension Daughter    Heart disease Daughter    Hypertension Sister    High Cholesterol Sister    Asthma Brother    Bronchitis Brother    Colon cancer Maternal Grandfather     Medications: Patient's Medications  New Prescriptions   No medications on file  Previous Medications   ACETAMINOPHEN (TYLENOL) 325 MG TABLET    Take 650 mg by mouth 2 (two) times a week. As needed   AMOXICILLIN-CLAVULANATE (AUGMENTIN) 500-125 MG TABLET    Take 1 tablet by mouth in the morning and at bedtime.   ATORVASTATIN (LIPITOR) 80 MG TABLET    Take 1 tablet (80 mg total) by mouth at bedtime.   CHOLECALCIFEROL 50 MCG (2000 UT) TABS    Take 1 tablet by mouth daily in the afternoon.   EMPAGLIFLOZIN (JARDIANCE) 10 MG TABS TABLET    Take 1 tablet (10 mg total) by mouth daily.   ENSURE (ENSURE)    Take 237 mLs by mouth daily.   FUROSEMIDE (LASIX) 20 MG TABLET    Take 1 tablet (20 mg total) by mouth daily.   LACOSAMIDE (VIMPAT) 100 MG TABS    Take 1 tablet by mouth every morning and 1 tablet every night at bedtime   LEVETIRACETAM (KEPPRA) 750 MG TABLET    Take 2 tablets (1,500 mg total) by mouth 2 (two) times daily.   METOPROLOL SUCCINATE (TOPROL-XL) 50 MG 24 HR TABLET    Take 1 tablet (50 mg total) by mouth daily. Take with or immediately following a meal.   NYSTATIN CREAM (MYCOSTATIN)    Apply 1 Application topically 2 (two) times daily.   SACUBITRIL-VALSARTAN (ENTRESTO) 97-103 MG    Take 1 tablet by mouth 2 (two) times daily.   SPIRONOLACTONE (ALDACTONE) 25 MG TABLET    TAKE 1  TABLET(25 MG) BY MOUTH DAILY   WARFARIN (COUMADIN) 1 MG TABLET    Take 1 tablet (1 mg total) by mouth daily.   WARFARIN (COUMADIN) 5 MG TABLET    TAKE 1 TABLET(5 MG) BY MOUTH DAILY  Modified Medications   No medications on file  Discontinued Medications   No medications on file    Physical Exam:  Vitals:   12/05/22 0923  BP: 108/70  Pulse: 61  Resp: 16  Temp: (!) 96.6 F (35.9 C)  SpO2: 95%  Height: 5\' 7"  (1.702 m)   Body mass index is 21.46 kg/m. Wt Readings from Last 3 Encounters:  11/06/22 137  lb (62.1 kg)  10/26/22 130 lb (59 kg)  10/03/22 133 lb 12.8 oz (60.7 kg)    Physical Exam Constitutional:      Comments: Poor oral hygiene  Cardiovascular:     Rate and Rhythm: Normal rate and regular rhythm.  Pulmonary:     Effort: Pulmonary effort is normal. No respiratory distress.     Breath sounds: Normal breath sounds. No wheezing.  Abdominal:     General: Bowel sounds are normal. There is no distension.     Tenderness: There is no abdominal tenderness. There is no guarding or rebound.  Musculoskeletal:        General: No swelling or tenderness.  Skin:    General: Skin is dry.  Neurological:     Mental Status: She is alert. Mental status is at baseline.     Comments: Residual weakness on Rt side      Labs reviewed: Basic Metabolic Panel: Recent Labs    12/28/21 1027 06/06/22 1009 10/26/22 0929  NA 136 139 139  K 4.5 4.7 4.6  CL 105 105 105  CO2 23 20 21*  GLUCOSE 79 75 78  BUN 22 26* 22  CREATININE 1.11* 1.25* 1.25*  CALCIUM 9.2 9.6 8.8*   Liver Function Tests: Recent Labs    06/06/22 1009  AST 23  ALT 9  BILITOT 0.4  PROT 7.8   No results for input(s): "LIPASE", "AMYLASE" in the last 8760 hours. No results for input(s): "AMMONIA" in the last 8760 hours. CBC: Recent Labs    12/28/21 1027 10/03/22 1010  WBC 5.0 5.9  NEUTROABS  --  3,463  HGB 13.2 12.5  HCT 41.0 38.6  MCV 89.7 88.1  PLT 244 304   Lipid Panel: Recent Labs     06/06/22 1009  CHOL 141  HDL 46*  LDLCALC 76  TRIG 409  CHOLHDL 3.1   TSH: No results for input(s): "TSH" in the last 8760 hours. A1C: Lab Results  Component Value Date   HGBA1C 6.1 (H) 06/06/2022     Assessment/Plan  1. Need for influenza vaccination   - Flu Vaccine Trivalent High Dose (Fluad)  2. Anticoagulated on Coumadin  INR 3.1 Pt is on 5 mg +1 mg coumadin  Will decrease daily dose to 3.5 mg today and resume with 6 mg from tomorrow Monitor for signs of bleeding   3. Poor dental hygiene  Instructed patient to follow up with dentistry  4. Secondary cardiomyopathy (HCC)  Euvolemic on exam Cont with lipitor, jardiance, entresto, lasix,   No follow-ups on file.: 1 month    20   Total time spent for obtaining history,  performing a medically appropriate examination and evaluation, reviewing the tests, counseling and educating the patient/family/caregiver, ordering medications, tests, or procedures, referring v  , documenting clinical information in the electronic or other health record, independently interpreting results and communicating results to the patient/family/caregiver, care coordination (not separately reported)

## 2022-12-17 ENCOUNTER — Other Ambulatory Visit (HOSPITAL_COMMUNITY): Payer: Self-pay | Admitting: Pharmacist

## 2022-12-17 MED ORDER — ENTRESTO 97-103 MG PO TABS: 1.0000 | ORAL_TABLET | Freq: Two times a day (BID) | ORAL | 11 refills | Status: DC

## 2022-12-17 MED ORDER — EMPAGLIFLOZIN 10 MG PO TABS: 10.0000 mg | ORAL_TABLET | Freq: Every day | ORAL | 11 refills | Status: DC

## 2022-12-17 MED ORDER — SPIRONOLACTONE 25 MG PO TABS: ORAL_TABLET | ORAL | 11 refills | Status: DC

## 2022-12-18 ENCOUNTER — Other Ambulatory Visit (HOSPITAL_COMMUNITY): Payer: Self-pay

## 2022-12-27 ENCOUNTER — Encounter (HOSPITAL_COMMUNITY): Payer: Self-pay

## 2022-12-27 ENCOUNTER — Ambulatory Visit (HOSPITAL_COMMUNITY)
Admission: RE | Admit: 2022-12-27 | Discharge: 2022-12-27 | Disposition: A | Discharge status: Home / Self Care | Payer: Medicare HMO | Source: Ambulatory Visit | Attending: Cardiology | Admitting: Cardiology

## 2022-12-27 VITALS — BP 106/56 | HR 61 | Wt 139.8 lb

## 2022-12-27 DIAGNOSIS — Z7984 Long term (current) use of oral hypoglycemic drugs: Secondary | ICD-10-CM | POA: Diagnosis not present

## 2022-12-27 DIAGNOSIS — R0602 Shortness of breath: Secondary | ICD-10-CM | POA: Insufficient documentation

## 2022-12-27 DIAGNOSIS — Z8673 Personal history of transient ischemic attack (TIA), and cerebral infarction without residual deficits: Secondary | ICD-10-CM | POA: Diagnosis not present

## 2022-12-27 DIAGNOSIS — I255 Ischemic cardiomyopathy: Secondary | ICD-10-CM | POA: Insufficient documentation

## 2022-12-27 DIAGNOSIS — G40909 Epilepsy, unspecified, not intractable, without status epilepticus: Secondary | ICD-10-CM | POA: Diagnosis not present

## 2022-12-27 DIAGNOSIS — I4510 Unspecified right bundle-branch block: Secondary | ICD-10-CM | POA: Insufficient documentation

## 2022-12-27 DIAGNOSIS — Z8679 Personal history of other diseases of the circulatory system: Secondary | ICD-10-CM | POA: Insufficient documentation

## 2022-12-27 DIAGNOSIS — I5022 Chronic systolic (congestive) heart failure: Secondary | ICD-10-CM | POA: Diagnosis not present

## 2022-12-27 DIAGNOSIS — Z79899 Other long term (current) drug therapy: Secondary | ICD-10-CM | POA: Diagnosis not present

## 2022-12-27 DIAGNOSIS — I2510 Atherosclerotic heart disease of native coronary artery without angina pectoris: Secondary | ICD-10-CM | POA: Diagnosis not present

## 2022-12-27 DIAGNOSIS — R4701 Aphasia: Secondary | ICD-10-CM | POA: Insufficient documentation

## 2022-12-27 DIAGNOSIS — Z951 Presence of aortocoronary bypass graft: Secondary | ICD-10-CM | POA: Insufficient documentation

## 2022-12-27 LAB — BASIC METABOLIC PANEL
Anion gap: 8 (ref 5–15)
BUN: 28 mg/dL — ABNORMAL HIGH (ref 8–23)
CO2: 23 mmol/L (ref 22–32)
Calcium: 9.2 mg/dL (ref 8.9–10.3)
Chloride: 107 mmol/L (ref 98–111)
Creatinine, Ser: 1.21 mg/dL — ABNORMAL HIGH (ref 0.44–1.00)
GFR, Estimated: 47 mL/min — ABNORMAL LOW (ref >60)
Glucose, Bld: 79 mg/dL (ref 70–99)
Potassium: 4.7 mmol/L (ref 3.5–5.1)
Sodium: 138 mmol/L (ref 135–145)

## 2022-12-27 LAB — BRAIN NATRIURETIC PEPTIDE: B Natriuretic Peptide: 483.3 pg/mL — ABNORMAL HIGH (ref 0.0–100.0)

## 2022-12-27 MED ORDER — FUROSEMIDE 20 MG PO TABS: 40.0000 mg | ORAL_TABLET | Freq: Every day | ORAL | 3 refills | Status: DC

## 2022-12-27 MED ORDER — METOPROLOL SUCCINATE ER 25 MG PO TB24: 25.0000 mg | ORAL_TABLET | Freq: Every day | ORAL | 3 refills | Status: DC

## 2022-12-27 NOTE — Progress Notes (Signed)
ADVANCED HF CLINIC NOTE   PCP: Venita Sheffield, MD HF Cardiology: Dr. Shirlee Latch  72 y.o. with history of CVA, chronic systolic CHF, CHB, and CAD returns for followup of CHF.   She had a stroke in 2009, thought to be cardioembolic.  She has had right hemiparesis and aphasia since that time.   [name] was hospitalized in August 2020 with a seizure after being out of her medications for 4 days.  She required intubation. High-sensitivity troponin trended to 1905. An echocardiogram 08/2018 revealed EF of 35 to 40% with global hypokinesis and moderate LVH, elevated LVEDP. She also had moderate AR. Nuclear stress test in August 2020 showed a large severe fixed inferior, anterior apical, and inferolateral perfusion defect suggestive of scar without ischemia.  Study was high risk due to EF estimated at 34% but mentions no significant reversible ischemia. She was deemed not a good candidate for cardiac catheterization during that hospitalization.    Patient was admitted 3/22 following a syncopal episode and was found to be in complete heart block.  This improved initially after stopping nodal blockers.  Echo in 3/22 showed EF 35-40%.  LHC showed significant left main and LCx disease.  She was deemed to be a poor candidate for PCI.  In 4/22, she had CABG with LIMA-LAD and SVG-OM2.  Post-op, she developed CHB again.  St Jude CRT-P device was placed. She was discharged to a rehab facility.   Echo in 9/22 showed EF 35% with basal to mid inferior and inferolateral AK, mildly decreased RV function, mild-moderate MR.    Echo 12/23 EF 25-30%, RV mod reduced.   Today she returns for AHF follow up with her husband. Her husband helps provide most of her history with her expressive aphasia. Doing well symptomatically. Not SOB w/ basic ADLs but gets SOB when walking longer distances. Denies CP. No LEE. Has had recent wt gain, up 9 lb since previous visit. Husband thinks this is caloric. He also reports that she  seasons her food w/ lots of salt. Reports full med compliance and good UOP w/ daily lasix.   Device interrogation today shows thoracic impedence slightly below reference curve. No AT/AF. BiV pacing ~100%. BP soft 106/56 prior to am meds. Denies orthostatic symptoms.    Device interrogation (personally reviewed): 100% BiV pacing, no AF,  thoracic impedance slightly below threshold   Labs (4/22): K 3.9, creatinine 0.83, hgb 9.4 Labs (7/22): K 4.2, creatinine 0.93 Labs (9/22): LDL 67 Labs (2/23): K 4.5, creatinine 1.12 Labs (3/23): K 4.5, creatinine 0.96 Labs (5/23): K 4.4, creatinine 1.19, LDL 67, HDL 46 Labs (8/23): K 4.4, creatinine 1.4 Labs (5/24): K 4.7, SCr 1.25, A1c 6.1 Labs (10/24): 4.6, SCr 1.25, BNP 558.1    ECG: No EKG done today   PMH: 1. H/o seizure disorder.  2. CVA: 2009 with aphasia and right hemiparesis.  3. Complete heart block: St Jude CRT-P. 4. Chronic systolic CHF: Ischemic cardiomyopathy. St Jude CRT-P device.  - Echo (3/22): EF 35-40%, inferior and lateral HK, normal RV.  - Coronary angiography (4/22): 60-70% dLM, 60-70% ostial LCx, 99% proximal calcified LCx (LCx was large, dominant vessel).  - Echo (9/22): EF 35% with basal to mid inferior and inferolateral AK, mildly decreased RV function, mild-moderate MR. 5. CAD:  Coronary angiography (4/22) with 60-70% dLM, 60-70% ostial LCx, 99% proximal calcified LCx (LCx was large, dominant vessel). - CABG x 4 in 4/22: LIMA-LAD, SVG-OM2.  6. CHB: St Jude CRT-P placed in 4/22.  Social History   Socioeconomic History   Marital status: Married    Spouse name: [name]   Number of children: 2   Years of education: 12   Highest education level: Not on file  Occupational History    Employer: RETIRED  Tobacco Use   Smoking status: Former    Current packs/day: 0.00    Types: Cigarettes    Quit date: 09/26/2007    Years since quitting: 15.2   Smokeless tobacco: Never  Vaping Use   Vaping status: Never Used   Substance and Sexual Activity   Alcohol use: Not Currently    Comment: Former drinker, 15 years ago   Drug use: Never   Sexual activity: Not Currently  Other Topics Concern   Not on file  Social History Narrative   Tobacco use, amount per day now: None.   Past tobacco use, amount per day: Everyday.   How many years did you use tobacco: 30 years.   Alcohol use (drinks per week): Wine Daily   Diet:   Do you drink/eat things with caffeine: Yes   Marital status: Married                                  What year were you married?    Do you live in a house, apartment, assisted living, condo, trailer, etc.? House   Is it one or more stories? 1 story.   How many persons live in your home? 2   Do you have pets in your home?( please list) No   Highest Level of education completed? Associate   Current or past profession: Director    Do you exercise?    No                             Type and how often?   Do you have a living will?    Do you have a DNR form?                                   If not, do you want to discuss one?   Do you have signed POA/HPOA forms?                        If so, please bring to you appointment      Do you have any difficulty bathing or dressing yourself? Yes   Do you have any difficulty preparing food or eating? Yes   Do you have any difficulty managing your medications? Yes   Do you have any difficulty managing your finances? Yes   Do you have any difficulty affording your medications? Yes   Social Determinants of Health   Financial Resource Strain: Not on file  Food Insecurity: No Food Insecurity (10/20/2021)   Hunger Vital Sign    Worried About Running Out of Food in the Last Year: Never true    Ran Out of Food in the Last Year: Never true  Transportation Needs: No Transportation Needs (10/20/2021)   PRAPARE - Administrator, Civil Service (Medical): No    Lack of Transportation (Non-Medical): No  Physical Activity: Not on file  Stress:  Not on file  Social Connections: Not on file  Intimate Partner Violence: Not on file  Family History  Problem Relation Age of Onset   Diabetes Daughter    High Cholesterol Daughter    Hypertension Daughter    Heart disease Daughter    Hypertension Sister    High Cholesterol Sister    Asthma Brother    Bronchitis Brother    Colon cancer Maternal Grandfather    ROS: All systems reviewed and negative except as per HPI.   Current Outpatient Medications  Medication Sig Dispense Refill   acetaminophen (TYLENOL) 325 MG tablet Take 650 mg by mouth 2 (two) times a week. As needed     amoxicillin-clavulanate (AUGMENTIN) 500-125 MG tablet Take 1 tablet by mouth in the morning and at bedtime. 10 tablet 0   atorvastatin (LIPITOR) 80 MG tablet Take 1 tablet (80 mg total) by mouth at bedtime. 90 tablet 1   Cholecalciferol 50 MCG (2000 UT) TABS Take 1 tablet by mouth daily in the afternoon.     empagliflozin (JARDIANCE) 10 MG TABS tablet Take 1 tablet (10 mg total) by mouth daily. 30 tablet 11   Ensure (ENSURE) Take 237 mLs by mouth daily.     furosemide (LASIX) 20 MG tablet Take 1 tablet (20 mg total) by mouth daily. 30 tablet 3   Lacosamide (VIMPAT) 100 MG TABS Take 1 tablet by mouth every morning and 1 tablet every night at bedtime 60 tablet 5   levETIRAcetam (KEPPRA) 750 MG tablet Take 2 tablets (1,500 mg total) by mouth 2 (two) times daily. 360 tablet 3   metoprolol succinate (TOPROL-XL) 50 MG 24 hr tablet Take 1 tablet (50 mg total) by mouth daily. Take with or immediately following a meal. 90 tablet 3   nystatin cream (MYCOSTATIN) Apply 1 Application topically 2 (two) times daily. 30 g 0   sacubitril-valsartan (ENTRESTO) 97-103 MG Take 1 tablet by mouth 2 (two) times daily. 60 tablet 11   spironolactone (ALDACTONE) 25 MG tablet TAKE 1 TABLET(25 MG) BY MOUTH DAILY 30 tablet 11   warfarin (COUMADIN) 1 MG tablet Take 1 tablet (1 mg total) by mouth daily. 30 tablet 3   warfarin (COUMADIN) 5 MG  tablet TAKE 1 TABLET(5 MG) BY MOUTH DAILY 30 tablet 1   No current facility-administered medications for this encounter.   Wt Readings from Last 3 Encounters:  12/27/22 63.4 kg (139 lb 12.8 oz)  11/06/22 62.1 kg (137 lb)  10/26/22 59 kg (130 lb)   BP (!) 106/56   Pulse 61   Wt 63.4 kg (139 lb 12.8 oz)   SpO2 97%   BMI 21.90 kg/m   PHYSICAL EXAM: General:  Well appearing. No respiratory difficulty HEENT: normal Neck: supple. no JVD. Carotids 2+ bilat; no bruits. No lymphadenopathy or thyromegaly appreciated. Cor: PMI nondisplaced. Regular rate & rhythm. No rubs, gallops or murmurs. Lungs: clear Abdomen: soft, nontender, nondistended. No hepatosplenomegaly. No bruits or masses. Good bowel sounds. Extremities: no cyanosis, clubbing, rash, edema Neuro: alert & oriented x 3, moves all 4 extremities w/o difficulty. Affect pleasant. +expressive aphasia   Asssessment/Plan: 1. Complete heart block: Patient had baseline NSR with LAFB/RBBB, so there was underlying conduction system disease.  She was transiently in CHB pre-CABG and developed persistent CHB post-CABG.  She now has [employer] school [employer] CRT-P device.   - 100% BiV paced on device interrogation  - followed by EP clinic   2. Chronic systolic CHF: Ischemic cardiomyopathy.  Echo in 8/20 with EF 35-40% and wall motion abnormalities. LHC in 4/22 showed left main/severe codominant LCx disease.  Echo  in 3/22 with EF 35-40%, inferior and lateral hypokinesis.  Echo in 9/22 with EF 35%, mild RV dysfunction.  Now s/p CABG.  Most recent Echo 12/23 EF 25-30%, RV moderately reduced - NYHA Class II. Not overly volume overloaded on exam but optivol elevated, suggesting mild fluid accumulation. This is in the setting of dietary indiscretion w/ sodium  - increase Lasix to 40 mg daily. Instructed to reduce sodium intake  - Reduce Toprol XL to 25 mg daily given soft BP  - Continue Entresto 97/103 bid.   - Continue spironolactone 25 mg daily.    - Continue  Jardiance 10 mg daily - Obtain BMP and BNP today   3. 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 Has residual long-standing aphasia and right hemiparesis.   - Continue warfarin. Followed by coumadin clinic  4. H/o seizure disorder: Continue Keppra.  - followed by neurology   5. CAD:  LHC showed 60-70% distal left main stenosis, heavily calcified proximal LCx with 60-70% stenosis at the ostium and discrete 99% stenosis in the proximal LCx.  S/p CABG x 2 4/22 w/ LIMA-LAD, SVG-OM2.   - stable w/o CP  - no ASA given she is on Coumadin  - on statin. Check LP and HFTs    F/u w/ APP in 4 wks to reassess volume status. Dr. Shirlee Latch in ~4 months   Valerie Minshall Sharol Harness PA-C 12/27/2022

## 2022-12-27 NOTE — Patient Instructions (Signed)
Labs done today. We will contact you only if your labs are abnormal.  DECREASE Metoprolol to 25mg  (1 tablet) by mouth daily.   INCREASE Lasix to 40mg  (2 tablets) bym outh daily.   No other medication changes were made. Please continue all current medications as prescribed.  Your physician recommends that you schedule a follow-up appointment in: 4 weeks with our NP/PA Clinic here in our office.   If you have any questions or concerns before your next appointment please send [place] a message through [place] or call our office at [phone].    TO LEAVE A MESSAGE FOR THE NURSE SELECT OPTION 2, PLEASE LEAVE A MESSAGE INCLUDING: YOUR NAME DATE OF BIRTH CALL BACK NUMBER REASON FOR CALL**this is important as we prioritize the call backs  YOU WILL RECEIVE A CALL BACK THE SAME DAY AS LONG AS YOU CALL BEFORE 4:00 PM   Do the following things EVERYDAY: Weigh yourself in the morning before breakfast. Write it down and keep it in a log. Take your medicines as prescribed Eat low salt foods--Limit salt (sodium) to 2000 mg per day.  Stay as active as you can everyday Limit all fluids for the day to less than 2 liters   At the [facility], you and your health needs are our priority. As part of our continuing mission to provide you with exceptional heart care, we have created designated Provider Care Teams. These Care Teams include your primary Cardiologist (physician) and Advanced Practice Providers (APPs- Physician Assistants and Nurse Practitioners) who all work together to provide you with the care you need, when you need it.   You may see any of the following providers on your designated Care Team at your next follow up: Dr Arvilla Meres Dr Marca Ancona Dr. Marcos Eke, NP Robbie Lis, [place] [facility] [place], [place] Brynda Peon, NP Karle Plumber, PharmD   Please be sure to bring in all your medications bottles to every appointment.     Thank you for choosing [facility]

## 2023-01-02 ENCOUNTER — Ambulatory Visit: Payer: Medicare HMO | Admitting: Sports Medicine

## 2023-01-07 ENCOUNTER — Encounter: Payer: Self-pay | Admitting: Cardiology

## 2023-01-08 ENCOUNTER — Encounter: Payer: Self-pay | Admitting: Podiatry

## 2023-01-08 ENCOUNTER — Ambulatory Visit: Payer: Medicare HMO | Admitting: Sports Medicine

## 2023-01-08 ENCOUNTER — Ambulatory Visit: Payer: Medicare HMO | Admitting: Podiatry

## 2023-01-08 DIAGNOSIS — M79674 Pain in right toe(s): Secondary | ICD-10-CM | POA: Diagnosis not present

## 2023-01-08 DIAGNOSIS — B351 Tinea unguium: Secondary | ICD-10-CM | POA: Diagnosis not present

## 2023-01-08 DIAGNOSIS — M79675 Pain in left toe(s): Secondary | ICD-10-CM | POA: Diagnosis not present

## 2023-01-09 ENCOUNTER — Ambulatory Visit (INDEPENDENT_AMBULATORY_CARE_PROVIDER_SITE_OTHER): Payer: Medicare HMO | Admitting: Sports Medicine

## 2023-01-09 ENCOUNTER — Encounter: Payer: Self-pay | Admitting: Sports Medicine

## 2023-01-09 VITALS — BP 100/68 | HR 73 | Temp 97.8°F | Resp 16 | Ht 67.0 in

## 2023-01-09 DIAGNOSIS — I5042 Chronic combined systolic (congestive) and diastolic (congestive) heart failure: Secondary | ICD-10-CM | POA: Diagnosis not present

## 2023-01-09 DIAGNOSIS — G40909 Epilepsy, unspecified, not intractable, without status epilepticus: Secondary | ICD-10-CM | POA: Diagnosis not present

## 2023-01-09 DIAGNOSIS — Z7901 Long term (current) use of anticoagulants: Secondary | ICD-10-CM

## 2023-01-09 DIAGNOSIS — Z1211 Encounter for screening for malignant neoplasm of colon: Secondary | ICD-10-CM

## 2023-01-09 DIAGNOSIS — N1831 Chronic kidney disease, stage 3a: Secondary | ICD-10-CM | POA: Diagnosis not present

## 2023-01-09 DIAGNOSIS — Z8673 Personal history of transient ischemic attack (TIA), and cerebral infarction without residual deficits: Secondary | ICD-10-CM | POA: Diagnosis not present

## 2023-01-09 LAB — POCT INR: POC INR: 2.7

## 2023-01-09 NOTE — Progress Notes (Signed)
Careteam: Patient Care Team: Valerie Sheffield, MD as PCP - General (Internal Medicine) Valerie Lemming, MD as PCP - Electrophysiology (Cardiology) Valerie Bathe, MD as PCP - Cardiology (Cardiology) Valerie Decamp, MD as Attending Physician (Cardiology) Valerie Paradise, MD (Internal Medicine) Valerie Si, MD as Attending Physician (Cardiology) [name], Valerie Bayley, MD as Consulting Physician (Neurology) [name], Valerie Lew, RN as [facility] Care Management  PLACE OF SERVICE:  [facility]  Advanced Directive information Does Patient Have a Medical Advance Directive?: No, Does patient want to make changes to medical advance directive?: No - Patient declined  Allergies  Allergen Reactions   Lexiscan [Regadenoson] Other (See Comments)    Perioral tingling and throat tightness    Chief Complaint  Patient presents with   Follow-up    4 week follow up. Patient spouse [name] denies that patient missed any doses for Warfarin.   Health Maintenance    Discuss the need for Urine microalbumin, and Cologuard.    Immunizations    Discuss the need for DTAP vaccine, and Covid Booster.     HPI: Patient is a 72 y.o. female is here for follow up  Accompanied by her husband  Pt has dysarthria from previous stroke Most of the history is obtained from her husband  CAD Systolic CHF  Followed with cardiology  Reviewed cardiology note Recommended increase lasix 40 mg daily  Reduce Toprol XL to 25 mg daily given soft BP  - Continue Entresto 97/103 bid.   - Continue spironolactone 25 mg daily.    - Continue Jardiance 10 mg daily   H/o Seizure No recent seizures On keppra  CVA  In 2009  On warfarin as per neurology/ cardiology  No signs of bleeding  CKD    Latest Ref Rng & Units 12/27/2022    9:31 AM 10/26/2022    9:29 AM 06/06/2022   10:09 AM  BMP  Glucose 70 - 99 mg/dL 79  78  75   BUN 8 - 23 mg/dL 28  22  26    Creatinine 0.44 - 1.00 mg/dL 0.96  0.45   4.09   BUN/Creat Ratio 6 - 22 (calc)   21   Sodium 135 - 145 mmol/L 138  139  139   Potassium 3.5 - 5.1 mmol/L 4.7  4.6  4.7   Chloride 98 - 111 mmol/L 107  105  105   CO2 22 - 32 mmol/L 23  21  20    Calcium 8.9 - 10.3 mg/dL 9.2  8.8  9.6     Pt saw dentist yesterday   Review of Systems:  ROS Negative unless indicated in HPI.   Past Medical History:  Diagnosis Date   Adenomatous polyp of colon 09/2012   repeat colonoscopy in 5 years by Dr. Jarold Motto   CHF (congestive heart failure) (HCC)    EF 15-20% as of 05/28/11 (Dr [facility] summary)   CVA (cerebral infarction) 12/2007   H/O heart bypass surgery 2022   Hemiparesis (HCC)    Hyperlipidemia    Hypertension    Seizures (HCC)    Sickle cell anemia (HCC)    Stroke (HCC)    Vitamin D deficiency 2010   Past Surgical History:  Procedure Laterality Date   ABDOMINAL HYSTERECTOMY     BIV PACEMAKER INSERTION CRT-P N/A 04/29/2020   Procedure: BIV PACEMAKER INSERTION CRT-P;  Surgeon: Valerie Lemming, MD;  Location: [facility] INVASIVE CV LAB;  Service: Cardiovascular;  Laterality: N/A;   CORONARY ARTERY  BYPASS GRAFT N/A 04/25/2020   Procedure: CORONARY ARTERY BYPASS GRAFTING (CABG) TIMES TWO USING LEFT INTERNAL MAMMARY ARTERY AND RIGHT GREATER SAPHENOUS VEIN HARVESTED ENDOSCOPICALLY;  Surgeon: Loreli Slot, MD;  Location: [facility] OR;  Service: Open Heart Surgery;  Laterality: N/A;   FRACTURE SURGERY     HIP SURGERY     LEFT HEART CATH AND CORONARY ANGIOGRAPHY N/A 04/22/2020   Procedure: LEFT HEART CATH AND CORONARY ANGIOGRAPHY;  Surgeon: Laurey Morale, MD;  Location: [facility] INVASIVE CV LAB;  Service: Cardiovascular;  Laterality: N/A;   TEE WITHOUT CARDIOVERSION  04/25/2020   Procedure: TRANSESOPHAGEAL ECHOCARDIOGRAM (TEE);  Surgeon: Loreli Slot, MD;  Location: [facility] OR;  Service: Open Heart Surgery;;   TEMPORARY PACEMAKER N/A 04/21/2020   Procedure: TEMPORARY PACEMAKER;  Surgeon: Laurey Morale, MD;  Location: [facility]  INVASIVE CV LAB;  Service: Cardiovascular;  Laterality: N/A;   TUBAL LIGATION     Social History:   reports that she quit smoking about 15 years ago. Her smoking use included cigarettes. She has never used smokeless tobacco. She reports that she does not currently use alcohol. She reports that she does not use drugs.  Family History  Problem Relation Age of Onset   Diabetes Daughter    High Cholesterol Daughter    Hypertension Daughter    Heart disease Daughter    Hypertension Sister    High Cholesterol Sister    Asthma Brother    Bronchitis Brother    Colon cancer Maternal Grandfather     Medications: Patient's Medications  New Prescriptions   No medications on file  Previous Medications   ACETAMINOPHEN (TYLENOL) 325 MG TABLET    Take 650 mg by mouth 2 (two) times a week. As needed   AMOXICILLIN-CLAVULANATE (AUGMENTIN) 500-125 MG TABLET    Take 1 tablet by mouth in the morning and at bedtime.   ATORVASTATIN (LIPITOR) 80 MG TABLET    Take 1 tablet (80 mg total) by mouth at bedtime.   CHOLECALCIFEROL 50 MCG (2000 UT) TABS    Take 1 tablet by mouth daily in the afternoon.   EMPAGLIFLOZIN (JARDIANCE) 10 MG TABS TABLET    Take 1 tablet (10 mg total) by mouth daily.   ENSURE (ENSURE)    Take 237 mLs by mouth daily.   FUROSEMIDE (LASIX) 20 MG TABLET    Take 2 tablets (40 mg total) by mouth daily.   LACOSAMIDE (VIMPAT) 100 MG TABS    Take 1 tablet by mouth every morning and 1 tablet every night at bedtime   LEVETIRACETAM (KEPPRA) 750 MG TABLET    Take 2 tablets (1,500 mg total) by mouth 2 (two) times daily.   METOPROLOL TARTRATE (LOPRESSOR) 25 MG TABLET    Take 12.5 mg by mouth daily.   NYSTATIN CREAM (MYCOSTATIN)    Apply 1 Application topically 2 (two) times daily.   SACUBITRIL-VALSARTAN (ENTRESTO) 97-103 MG    Take 1 tablet by mouth 2 (two) times daily.   SPIRONOLACTONE (ALDACTONE) 25 MG TABLET    TAKE 1 TABLET(25 MG) BY MOUTH DAILY   WARFARIN (COUMADIN) 1 MG TABLET    Take 1 tablet  (1 mg total) by mouth daily.   WARFARIN (COUMADIN) 5 MG TABLET    TAKE 1 TABLET(5 MG) BY MOUTH DAILY  Modified Medications   No medications on file  Discontinued Medications   METOPROLOL SUCCINATE (TOPROL-XL) 25 MG 24 HR TABLET    Take 1 tablet (25 mg total) by mouth daily. Take with or  immediately following a meal.    Physical Exam: Vitals:   01/09/23 0920  BP: 100/68  Pulse: 73  Resp: 16  Temp: 97.8 F (36.6 C)  SpO2: 97%  Height: 5\' 7"  (1.702 m)   Body mass index is 21.9 kg/m. BP Readings from Last 3 Encounters:  01/09/23 100/68  12/27/22 (!) 106/56  12/05/22 108/70   Wt Readings from Last 3 Encounters:  12/27/22 139 lb 12.8 oz (63.4 kg)  11/06/22 137 lb (62.1 kg)  10/26/22 130 lb (59 kg)    Physical Exam Constitutional:      Appearance: Normal appearance.  Cardiovascular:     Rate and Rhythm: Normal rate and regular rhythm.  Pulmonary:     Effort: Pulmonary effort is normal. No respiratory distress.     Breath sounds: Normal breath sounds. No wheezing.  Abdominal:     General: Bowel sounds are normal. There is no distension.     Tenderness: There is no abdominal tenderness. There is no guarding or rebound.     Comments:    Musculoskeletal:        General: No swelling or tenderness.  Neurological:     Mental Status: She is alert. Mental status is at baseline.     Comments: Residual rt arm weakness     Labs reviewed: Basic Metabolic Panel: Recent Labs    06/06/22 1009 10/26/22 0929 12/27/22 0931  NA 139 139 138  K 4.7 4.6 4.7  CL 105 105 107  CO2 20 21* 23  GLUCOSE 75 78 79  BUN 26* 22 28*  CREATININE 1.25* 1.25* 1.21*  CALCIUM 9.6 8.8* 9.2   Liver Function Tests: Recent Labs    06/06/22 1009  AST 23  ALT 9  BILITOT 0.4  PROT 7.8   No results for input(s): "LIPASE", "AMYLASE" in the last 8760 hours. No results for input(s): "AMMONIA" in the last 8760 hours. CBC: Recent Labs    10/03/22 1010  WBC 5.9  NEUTROABS 3,463  HGB 12.5   HCT 38.6  MCV 88.1  PLT 304   Lipid Panel: Recent Labs    06/06/22 1009  CHOL 141  HDL 46*  LDLCALC 76  TRIG 098  CHOLHDL 3.1   TSH: No results for input(s): "TSH" in the last 8760 hours. A1C: Lab Results  Component Value Date   HGBA1C 6.1 (H) 06/06/2022     Assessment/Plan  1. Anticoagulated on Coumadin (Primary)  History of CVA (cerebrovascular accident) Pt on coumadin as per neurology  Inr 2.7 Cont with same dose  Monitor for signs of bleeding    Seizure disorder (HCC)  Cont with keppra   Chronic combined systolic and diastolic heart failure (HCC)  Lungs clear  Cont lasix 40 mg daily  Toprol XL to 25 mg daily given soft BP   Entresto 97/103 bid.    spironolactone 25 mg daily.     Jardiance 10 mg daily   CKD     Latest Ref Rng & Units 12/27/2022    9:31 AM 10/26/2022    9:29 AM 06/06/2022   10:09 AM  BMP  Glucose 70 - 99 mg/dL 79  78  75   BUN 8 - 23 mg/dL 28  22  26    Creatinine 0.44 - 1.00 mg/dL 1.19  1.47  8.29   BUN/Creat Ratio 6 - 22 (calc)   21   Sodium 135 - 145 mmol/L 138  139  139   Potassium 3.5 - 5.1 mmol/L 4.7  4.6  4.7   Chloride 98 - 111 mmol/L 107  105  105   CO2 22 - 32 mmol/L 23  21  20    Calcium 8.9 - 10.3 mg/dL 9.2  8.8  9.6    Avoid nephrotoxic meds    Screening for colon cancer  Cologuard  Other orders - metoprolol tartrate (LOPRESSOR) 25 MG tablet; Take 12.5 mg by mouth daily.   Return in about 4 weeks (around 02/06/2023).:

## 2023-01-13 ENCOUNTER — Other Ambulatory Visit: Payer: Self-pay | Admitting: Cardiology

## 2023-01-13 NOTE — Progress Notes (Signed)
  Subjective:  Patient ID: [name], female    DOB: [birth year],  MRN: [id number]  72 y.o. female presents to clinic with  at risk foot care with h/o clotting disorder and painful elongated mycotic toenails 1-5 bilaterally which are tender when wearing enclosed shoe gear. Pain is relieved with periodic professional debridement. She is accompanied by her husband on today's visit. Chief Complaint  Patient presents with   Nail Problem    PATIENT STATES SHE SEEN HER PCP ABOUT A MONTH AGO,      New problem(s): None   PCP is Venita Sheffield, MD.  Allergies  Allergen Reactions   Lexiscan [Regadenoson] Other (See Comments)    Perioral tingling and throat tightness    Review of Systems: Negative except as noted in the HPI.   Objective:  [name] is a pleasant 72 y.o. female in NAD.Marland Kitchen AAO x 3.  Vascular Examination: Vascular status intact b/l with palpable pedal pulses. CFT immediate b/l. No edema. No pain with calf compression b/l. Skin temperature gradient WNL b/l. CFT <3 seconds b/l LE.  Neurological Examination: Sensation grossly intact b/l with 10 gram monofilament.  Dermatological Examination: Pedal skin with normal turgor, texture and tone b/l. Toenails 1-5 b/l thick, discolored, elongated with subungual debris and pain on dorsal palpation. No hyperkeratotic lesions noted b/l.   Musculoskeletal Examination: Dropfoot right lower extremity. Muscle strength 5/5 to all LE muscle groups of left foot. Utilizes wheelchair for mobility assistance.  Radiographs: None  Last A1c:      Latest Ref Rng & Units 06/06/2022   10:09 AM  Hemoglobin A1C  Hemoglobin-A1c <5.7 % of total Hgb 6.1      Assessment:   1. Pain due to onychomycosis of toenails of both feet    Plan:  Patient was evaluated and treated. All patient's and/or POA's questions/concerns addressed on today's visit. Toenails 1-5 debrided in length and girth without incident. Continue soft,  supportive shoe gear daily. Report any pedal injuries to medical professional. Call office if there are any questions/concerns. -Patient/POA to call should there be question/concern in the interim.  Return in about 3 months (around 04/08/2023).  Freddie Breech, DPM      [facility] LOCATION: [address], [place] [address]                   Office [phone]   [facility] LOCATION: [address] [place], [place] [address] Office [phone]

## 2023-01-14 ENCOUNTER — Telehealth: Payer: Self-pay | Admitting: *Deleted

## 2023-01-14 NOTE — Telephone Encounter (Signed)
I called and spoke with patient husband [name] and about a patient assistance program form we received. Pt takes Vimpat, however that medication was not listed on form.   I called [phone], option 2 ([facility] patient assistance) and spoke with Mamie Nick was told that pt application is good until 03/2023. No Rx is needed as of now, and pt will need to re-enroll in 03/2023 and he can call [phone] if any questions about the process. I called and spoke with patient husband [name] relayed all this information to him as well, along with phone #.   The form we received is only for new patients this does not apply for patient, pt thrown in shredder.

## 2023-01-24 NOTE — Progress Notes (Signed)
 ADVANCED HF CLINIC NOTE   PCP: Sherlynn Madden, MD HF Cardiology: Dr. Rolan  CC: HF follow up  73 y.o. with history of CVA, chronic systolic CHF, CHB, and CAD returns for followup of CHF.   She had a stroke in 2009, thought to be cardioembolic.  She has had right hemiparesis and aphasia since that time.   Ms. Colleran was hospitalized in August 2020 with a seizure after being out of her medications for 4 days.  She required intubation. High-sensitivity troponin trended to 1905. An echocardiogram 08/2018 revealed EF of 35 to 40% with global hypokinesis and moderate LVH, elevated LVEDP. She also had moderate AR. Nuclear stress test in August 2020 showed a large severe fixed inferior, anterior apical, and inferolateral perfusion defect suggestive of scar without ischemia.  Study was high risk due to EF estimated at 34% but mentions no significant reversible ischemia. She was deemed not a good candidate for cardiac catheterization during that hospitalization.    Patient was admitted 3/22 following a syncopal episode and was found to be in complete heart block.  This improved initially after stopping nodal blockers.  Echo in 3/22 showed EF 35-40%.  LHC showed significant left main and LCx disease.  She was deemed to be a poor candidate for PCI.  In 4/22, she had CABG with LIMA-LAD and SVG-OM2.  Post-op, she developed CHB again.  St Jude CRT-P device was placed. She was discharged to a rehab facility.   Echo in 9/22 showed EF 35% with basal to mid inferior and inferolateral AK, mildly decreased RV function, mild-moderate MR.    Echo 12/23 EF 25-30%, RV mod reduced.   Today she returns for AHF follow up with her husband. Her husband helps provide most of her history with her expressive aphasia. Overall feeling fine. She is not SOB walking short distances on flat ground. She uses a WC when outside of the house. Denies  CP, dizziness, edema, or PND/Orthopnea. Appetite ok. No fever or chills. Weight  at home 138 pounds. Taking all medications.   Device interrogation (personally reviewed): CorVue stable,  99% BiV, < 1% AFthoracic impedence at reference line.  Labs (4/22): K 3.9, creatinine 0.83, hgb 9.4 Labs (7/22): K 4.2, creatinine 0.93 Labs (9/22): LDL 67 Labs (2/23): K 4.5, creatinine 1.12 Labs (3/23): K 4.5, creatinine 0.96 Labs (5/23): K 4.4, creatinine 1.19, LDL 67, HDL 46 Labs (8/23): K 4.4, creatinine 1.4 Labs (5/24): K 4.7, creatinine 1.25, A1c 6.1, LDL 76, LFTs normal Labs (89/75): 4.6, creatinine 1.25, BNP 558.1  Labs (12/24): K 4.7, creatinine 1.21  ECG: none done today  PMH: 1. H/o seizure disorder.  2. CVA: 2009 with aphasia and right hemiparesis.  3. Complete heart block: St Jude CRT-P. 4. Chronic systolic CHF: Ischemic cardiomyopathy. St Jude CRT-P device.  - Echo (3/22): EF 35-40%, inferior and lateral HK, normal RV.  - Coronary angiography (4/22): 60-70% dLM, 60-70% ostial LCx, 99% proximal calcified LCx (LCx was large, dominant vessel).  - Echo (9/22): EF 35% with basal to mid inferior and inferolateral AK, mildly decreased RV function, mild-moderate MR. 5. CAD:  Coronary angiography (4/22) with 60-70% dLM, 60-70% ostial LCx, 99% proximal calcified LCx (LCx was large, dominant vessel). - CABG x 4 in 4/22: LIMA-LAD, SVG-OM2.  6. CHB: St Jude CRT-P placed in 4/22.   Social History   Socioeconomic History   Marital status: Married    Spouse name: [name]   Number of children: 2   Years of education: 26  Highest education level: Not on file  Occupational History    Employer: RETIRED  Tobacco Use   Smoking status: Former    Current packs/day: 0.00    Types: Cigarettes    Quit date: 09/26/2007    Years since quitting: 15.3   Smokeless tobacco: Never  Vaping Use   Vaping status: Never Used  Substance and Sexual Activity   Alcohol use: Not Currently    Comment: Former drinker, 15 years ago   Drug use: Never   Sexual activity: Not Currently  Other  Topics Concern   Not on file  Social History Narrative   Tobacco use, amount per day now: None.   Past tobacco use, amount per day: Everyday.   How many years did you use tobacco: 30 years.   Alcohol use (drinks per week): Wine Daily   Diet:   Do you drink/eat things with caffeine: Yes   Marital status: Married                                  What year were you married?    Do you live in a house, apartment, assisted living, condo, trailer, etc.? House   Is it one or more stories? 1 story.   How many persons live in your home? 2   Do you have pets in your home?( please list) No   Highest Level of education completed? Associate   Current or past profession: Director    Do you exercise?    No                             Type and how often?   Do you have a living will?    Do you have a DNR form?                                   If not, do you want to discuss one?   Do you have signed POA/HPOA forms?                        If so, please bring to you appointment      Do you have any difficulty bathing or dressing yourself? Yes   Do you have any difficulty preparing food or eating? Yes   Do you have any difficulty managing your medications? Yes   Do you have any difficulty managing your finances? Yes   Do you have any difficulty affording your medications? Yes   Social Drivers of Corporate Investment Banker Strain: Not on file  Food Insecurity: No Food Insecurity (10/20/2021)   Hunger Vital Sign    Worried About Running Out of Food in the Last Year: Never true    Ran Out of Food in the Last Year: Never true  Transportation Needs: No Transportation Needs (10/20/2021)   PRAPARE - Administrator, Civil Service (Medical): No    Lack of Transportation (Non-Medical): No  Physical Activity: Not on file  Stress: Not on file  Social Connections: Not on file  Intimate Partner Violence: Not on file   Family History  Problem Relation Age of Onset   Diabetes Daughter    High  Cholesterol Daughter    Hypertension Daughter    Heart disease Daughter  Hypertension Sister    High Cholesterol Sister    Asthma Brother    Bronchitis Brother    Colon cancer Maternal Grandfather    ROS: All systems reviewed and negative except as per HPI.   Current Outpatient Medications  Medication Sig Dispense Refill   acetaminophen  (TYLENOL ) 325 MG tablet Take 650 mg by mouth 2 (two) times a week. As needed     atorvastatin  (LIPITOR ) 80 MG tablet Take 1 tablet (80 mg total) by mouth at bedtime. 90 tablet 1   Cholecalciferol 50 MCG (2000 UT) TABS Take 1 tablet by mouth daily in the afternoon.     Ensure (ENSURE) Take 237 mLs by mouth daily.     furosemide  (LASIX ) 20 MG tablet Take 2 tablets (40 mg total) by mouth daily. (Patient taking differently: Patient takes 1 tablet by mouth in the morning and in the afternoon.) 180 tablet 3   JARDIANCE  10 MG TABS tablet TAKE 1 TABLET(10 MG) BY MOUTH DAILY 30 tablet 11   Lacosamide  (VIMPAT ) 100 MG TABS Take 1 tablet by mouth every morning and 1 tablet every night at bedtime 60 tablet 5   levETIRAcetam  (KEPPRA ) 750 MG tablet Take 2 tablets (1,500 mg total) by mouth 2 (two) times daily. 360 tablet 3   metoprolol  tartrate (LOPRESSOR ) 25 MG tablet Take 12.5 mg by mouth daily.     nystatin  cream (MYCOSTATIN ) Apply 1 Application topically 2 (two) times daily. (Patient taking differently: Apply 1 Application topically 2 (two) times daily. As needed) 30 g 0   sacubitril -valsartan  (ENTRESTO ) 97-103 MG Take 1 tablet by mouth 2 (two) times daily. 60 tablet 11   spironolactone  (ALDACTONE ) 25 MG tablet TAKE 1 TABLET(25 MG) BY MOUTH DAILY 30 tablet 11   warfarin (COUMADIN ) 1 MG tablet Take 1 tablet (1 mg total) by mouth daily. 30 tablet 3   warfarin (COUMADIN ) 5 MG tablet TAKE 1 TABLET(5 MG) BY MOUTH DAILY 30 tablet 1   No current facility-administered medications for this encounter.   Wt Readings from Last 3 Encounters:  01/25/23 63.4 kg (139 lb 12.8 oz)   12/27/22 63.4 kg (139 lb 12.8 oz)  11/06/22 62.1 kg (137 lb)   BP 104/66   Pulse 73   Wt 63.4 kg (139 lb 12.8 oz)   SpO2 99%   BMI 21.90 kg/m   PHYSICAL EXAM: General:  NAD. No resp difficulty, arrived in [facility] HEENT: Normal Neck: Supple. No JVD. Carotids 2+ bilat; no bruits. No lymphadenopathy or thryomegaly appreciated. Cor: PMI nondisplaced. Regular rate & rhythm. No rubs, gallops or murmurs. Lungs: Clear Abdomen: Soft, nontender, nondistended. No hepatosplenomegaly. No bruits or masses. Good bowel sounds. Extremities: No cyanosis, clubbing, rash, edema Neuro: Alert & oriented x 3, cranial nerves grossly intact. Moves all 4 extremities w/o difficulty. + expressive aphasia  Asssessment/Plan: 1. Complete heart block: Patient had baseline NSR with LAFB/RBBB, so there was underlying conduction system disease.  She was transiently in CHB pre-CABG and developed persistent CHB post-CABG.  She now has Secondary School Teacher CRT-P device.   - 99% BiV paced on device interrogation  - followed by EP clinic 2. Chronic systolic CHF: Ischemic cardiomyopathy.  Echo in 8/20 with EF 35-40% and wall motion abnormalities. LHC in 4/22 showed left main/severe codominant LCx disease.  Echo in 3/22 with EF 35-40%, inferior and lateral hypokinesis.  Echo in 9/22 with EF 35%, mild RV dysfunction.  Now s/p CABG.  Most recent Echo 12/23 EF 25-30%, RV moderately reduced. NYHA Class II, confounded  by mobility issues 2/2 CVA. She is not volume overloaded by exam or CorVue. - Continue Lasix   40 mg daily. BMET today. - Continue Toprol  XL 12.5 mg daily. - Continue Entresto  97/103 bid.   - Continue spironolactone  25 mg daily.    - Continue Jardiance  10 mg daily. - Update echo next visit 3. 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 Has residual long-standing aphasia and right hemiparesis.   - Continue warfarin. INR followed by coumadin  clinic. 4. H/o seizure disorder: Continue Keppra .  - Followed by neurology  5. CAD:  LHC showed  60-70% distal left main stenosis, heavily calcified proximal LCx with 60-70% stenosis at the ostium and discrete 99% stenosis in the proximal LCx.  S/p CABG x 2 4/22 w/ LIMA-LAD, SVG-OM2.  No chest pain. - no ASA given Coumadin   - Continue statin. Good lipids 5/24  Follow up in 3 months with Dr. Rolan + echo  Harlene HERO [facility] FNP-BC 01/25/2023

## 2023-01-25 ENCOUNTER — Ambulatory Visit (HOSPITAL_COMMUNITY)
Admission: RE | Admit: 2023-01-25 | Discharge: 2023-01-25 | Disposition: A | Discharge status: Home / Self Care | Payer: Medicare HMO | Source: Ambulatory Visit | Attending: Family Medicine | Admitting: Family Medicine

## 2023-01-25 ENCOUNTER — Encounter (HOSPITAL_COMMUNITY): Payer: Self-pay

## 2023-01-25 VITALS — BP 104/66 | HR 73 | Wt 139.8 lb

## 2023-01-25 DIAGNOSIS — Z951 Presence of aortocoronary bypass graft: Secondary | ICD-10-CM | POA: Insufficient documentation

## 2023-01-25 DIAGNOSIS — I110 Hypertensive heart disease with heart failure: Secondary | ICD-10-CM | POA: Diagnosis not present

## 2023-01-25 DIAGNOSIS — Z8673 Personal history of transient ischemic attack (TIA), and cerebral infarction without residual deficits: Secondary | ICD-10-CM | POA: Diagnosis not present

## 2023-01-25 DIAGNOSIS — I255 Ischemic cardiomyopathy: Secondary | ICD-10-CM | POA: Insufficient documentation

## 2023-01-25 DIAGNOSIS — Z79899 Other long term (current) drug therapy: Secondary | ICD-10-CM | POA: Insufficient documentation

## 2023-01-25 DIAGNOSIS — I442 Atrioventricular block, complete: Secondary | ICD-10-CM | POA: Insufficient documentation

## 2023-01-25 DIAGNOSIS — Z7984 Long term (current) use of oral hypoglycemic drugs: Secondary | ICD-10-CM | POA: Insufficient documentation

## 2023-01-25 DIAGNOSIS — I2510 Atherosclerotic heart disease of native coronary artery without angina pectoris: Secondary | ICD-10-CM | POA: Insufficient documentation

## 2023-01-25 DIAGNOSIS — G40909 Epilepsy, unspecified, not intractable, without status epilepticus: Secondary | ICD-10-CM | POA: Insufficient documentation

## 2023-01-25 DIAGNOSIS — Z950 Presence of cardiac pacemaker: Secondary | ICD-10-CM | POA: Diagnosis not present

## 2023-01-25 DIAGNOSIS — I69351 Hemiplegia and hemiparesis following cerebral infarction affecting right dominant side: Secondary | ICD-10-CM | POA: Insufficient documentation

## 2023-01-25 DIAGNOSIS — I5022 Chronic systolic (congestive) heart failure: Secondary | ICD-10-CM | POA: Insufficient documentation

## 2023-01-25 DIAGNOSIS — I69320 Aphasia following cerebral infarction: Secondary | ICD-10-CM | POA: Insufficient documentation

## 2023-01-25 DIAGNOSIS — Z7901 Long term (current) use of anticoagulants: Secondary | ICD-10-CM | POA: Insufficient documentation

## 2023-01-25 LAB — BRAIN NATRIURETIC PEPTIDE: B Natriuretic Peptide: 357.6 pg/mL — ABNORMAL HIGH (ref 0.0–100.0)

## 2023-01-25 LAB — BASIC METABOLIC PANEL
Anion gap: 11 (ref 5–15)
BUN: 28 mg/dL — ABNORMAL HIGH (ref 8–23)
CO2: 21 mmol/L — ABNORMAL LOW (ref 22–32)
Calcium: 9 mg/dL (ref 8.9–10.3)
Chloride: 105 mmol/L (ref 98–111)
Creatinine, Ser: 1.18 mg/dL — ABNORMAL HIGH (ref 0.44–1.00)
GFR, Estimated: 48 mL/min — ABNORMAL LOW (ref >60)
Glucose, Bld: 95 mg/dL (ref 70–99)
Potassium: 4.3 mmol/L (ref 3.5–5.1)
Sodium: 137 mmol/L (ref 135–145)

## 2023-01-25 NOTE — Patient Instructions (Signed)
 There has been no changes to your medications.  Labs done today, your results will be available in MyChart, we will contact you for abnormal readings.  Your physician recommends that you schedule a follow-up appointment in: 3 months ( April) ** PLEASE CALL THE OFFICE IN [place] TO ARRANGE YOUR FOLLOW UP APPOINTMENT.**  If you have any questions or concerns before your next appointment please send us  a message through [place] or call our office at [phone].    TO LEAVE A MESSAGE FOR THE NURSE SELECT OPTION 2, PLEASE LEAVE A MESSAGE INCLUDING: YOUR NAME DATE OF BIRTH CALL BACK NUMBER REASON FOR CALL**this is important as we prioritize the call backs  YOU WILL RECEIVE A CALL BACK THE SAME DAY AS LONG AS YOU CALL BEFORE 4:00 PM  At the [facility], you and your health needs are our priority. As part of our continuing mission to provide you with exceptional heart care, we have created designated Provider Care Teams. These Care Teams include your primary Cardiologist (physician) and Advanced Practice Providers (APPs- Physician Assistants and Nurse Practitioners) who all work together to provide you with the care you need, when you need it.   You may see any of the following providers on your designated Care Team at your next follow up: Dr Toribio Fuel Dr Ezra Shuck Dr. Ria Commander Dr. Morene Brownie Amy Lenetta, NP Caffie Shed, [place] [facility], [place] Beckey Coe, NP Jordan Lee, NP Tinnie Redman, PharmD   Please be sure to bring in all your medications bottles to every appointment.    Thank you for choosing [facility]

## 2023-01-27 ENCOUNTER — Other Ambulatory Visit: Payer: Self-pay | Admitting: Sports Medicine

## 2023-01-27 DIAGNOSIS — Z7901 Long term (current) use of anticoagulants: Secondary | ICD-10-CM

## 2023-01-28 ENCOUNTER — Ambulatory Visit (INDEPENDENT_AMBULATORY_CARE_PROVIDER_SITE_OTHER): Payer: Medicare HMO

## 2023-01-28 ENCOUNTER — Other Ambulatory Visit: Payer: Self-pay | Admitting: *Deleted

## 2023-01-28 DIAGNOSIS — I442 Atrioventricular block, complete: Secondary | ICD-10-CM

## 2023-01-28 MED ORDER — ATORVASTATIN CALCIUM 80 MG PO TABS: 80.0000 mg | ORAL_TABLET | Freq: Every day | ORAL | 1 refills | Status: DC

## 2023-01-28 NOTE — Telephone Encounter (Signed)
 Pharmacy requested refill

## 2023-01-29 LAB — CUP PACEART REMOTE DEVICE CHECK
Battery Remaining Longevity: 58 mo
Battery Remaining Percentage: 60 %
Battery Voltage: 2.98 V
Brady Statistic AP VP Percent: 13 %
Brady Statistic AP VS Percent: 1 %
Brady Statistic AS VP Percent: 86 %
Brady Statistic AS VS Percent: 1 %
Brady Statistic RA Percent Paced: 12 %
Date Time Interrogation Session: 20250106020010
Implantable Lead Connection Status: 753985
Implantable Lead Connection Status: 753985
Implantable Lead Connection Status: 753985
Implantable Lead Implant Date: 20220411
Implantable Lead Implant Date: 20220411
Implantable Lead Implant Date: 20220411
Implantable Lead Location: 753858
Implantable Lead Location: 753859
Implantable Lead Location: 753860
Implantable Pulse Generator Implant Date: 20220411
Lead Channel Impedance Value: 350 Ohm
Lead Channel Impedance Value: 430 Ohm
Lead Channel Impedance Value: 550 Ohm
Lead Channel Pacing Threshold Amplitude: 0.625 V
Lead Channel Pacing Threshold Amplitude: 0.75 V
Lead Channel Pacing Threshold Amplitude: 0.75 V
Lead Channel Pacing Threshold Pulse Width: 0.5 ms
Lead Channel Pacing Threshold Pulse Width: 0.5 ms
Lead Channel Pacing Threshold Pulse Width: 0.5 ms
Lead Channel Sensing Intrinsic Amplitude: 3.4 mV
Lead Channel Sensing Intrinsic Amplitude: 9.6 mV
Lead Channel Setting Pacing Amplitude: 2 V
Lead Channel Setting Pacing Amplitude: 2 V
Lead Channel Setting Pacing Amplitude: 2 V
Lead Channel Setting Pacing Pulse Width: 0.5 ms
Lead Channel Setting Pacing Pulse Width: 0.5 ms
Lead Channel Setting Sensing Sensitivity: 4 mV
Pulse Gen Model: 3562
Pulse Gen Serial Number: 6262141

## 2023-01-31 ENCOUNTER — Telehealth (HOSPITAL_COMMUNITY): Payer: Self-pay

## 2023-01-31 NOTE — Telephone Encounter (Signed)
  [facility]   Pre-operative Risk Assessment   Request for Surgical Clearance{  Procedure:  Dental Extraction - Amount of Teeth to be Pulled:  FULL MOUTH EXTRACTION   Date of Surgery:  Clearance TBD                               Surgeon: Hulda Arts  Surgeon's Group or Practice Name:  [facility]  Phone number:  [phone] Fax number:  [phone] { Type of Clearance Requested:   - Medical  - Pharmacy:  Hold Warfarin (Coumadin )   { Type of Anesthesia:   not listed  { Additional requests/questions:  Please fax a copy of medication recommendations and clearance to the surgeon's office.  Signed, Caridad Silveira B Dajanique Robley   01/31/2023, 4:38 PM

## 2023-02-14 ENCOUNTER — Telehealth (HOSPITAL_COMMUNITY): Payer: Self-pay

## 2023-02-14 ENCOUNTER — Encounter (HOSPITAL_COMMUNITY): Payer: Self-pay

## 2023-02-14 ENCOUNTER — Telehealth (HOSPITAL_COMMUNITY): Payer: Self-pay | Admitting: Pharmacy Technician

## 2023-02-14 NOTE — Telephone Encounter (Signed)
Advanced Heart Failure Patient Advocate Encounter  Patient asked for help with Jardiance. Looks like she has a Mudlogger to help cover the cost. The pharmacy was provided the information in September when the grant was obtained. Sent patient mychart message with a copy of the grant information.  Archer Asa, CPhT

## 2023-02-14 NOTE — Telephone Encounter (Signed)
Venita Sheffield, MD  You17 hours ago (3:48 PM)    Hold coumadin 5 days before and restart after next day after the procedure.    Clearance routed to requesting office via Epic fax function

## 2023-02-14 NOTE — Telephone Encounter (Signed)
Advanced [facility] Failure Patient Advocate Encounter  Patient called regarding copay for Jardiance. Spoke to pharmacy to confirm [employer] is on file and active. Pharmacy was able to reprocess and confirmed $0 copay.  Informed pt spouse by phone.  Burnell Blanks, CPhT Rx Patient Advocate Phone: [phone]

## 2023-02-17 ENCOUNTER — Other Ambulatory Visit: Payer: Self-pay | Admitting: Sports Medicine

## 2023-02-17 DIAGNOSIS — Z8673 Personal history of transient ischemic attack (TIA), and cerebral infarction without residual deficits: Secondary | ICD-10-CM

## 2023-02-17 DIAGNOSIS — Z5181 Encounter for therapeutic drug level monitoring: Secondary | ICD-10-CM

## 2023-02-18 ENCOUNTER — Other Ambulatory Visit: Payer: Self-pay | Admitting: Diagnostic Neuroimaging

## 2023-02-18 NOTE — Telephone Encounter (Signed)
Warnings. Pend and sent to PCP Venita Sheffield, MD

## 2023-02-18 NOTE — Telephone Encounter (Signed)
This message is already in Rx refill request.

## 2023-02-18 NOTE — Telephone Encounter (Addendum)
Last seen on 05/16/22 Follow up scheduled on 05/21/23 Last filled on 01/10/23 #60 tablets  Rx pending to be signed.

## 2023-02-18 NOTE — Telephone Encounter (Signed)
Pt's husband checking status of refill. Making sure it will be sent to [facility]. Also states per pharmacy the Dr needs to sign the Rx

## 2023-02-18 NOTE — Telephone Encounter (Signed)
Please deny this is duplicate. Your approved yesterday.

## 2023-02-19 ENCOUNTER — Telehealth: Payer: Self-pay | Admitting: Sports Medicine

## 2023-02-19 NOTE — Telephone Encounter (Signed)
Filled yesterday

## 2023-02-19 NOTE — Telephone Encounter (Signed)
Med clearance form gave to [facility]

## 2023-02-19 NOTE — Telephone Encounter (Signed)
Med clearance form signed

## 2023-02-25 ENCOUNTER — Encounter: Payer: Self-pay | Admitting: Family Medicine

## 2023-03-11 NOTE — Progress Notes (Signed)
 Remote pacemaker transmission.

## 2023-03-11 NOTE — Addendum Note (Signed)
 Addended by: Geralyn Flash D on: 03/11/2023 01:24 PM   Modules accepted: Orders

## 2023-03-26 ENCOUNTER — Encounter: Payer: Self-pay | Admitting: Family Medicine

## 2023-03-26 ENCOUNTER — Telehealth: Payer: Self-pay | Admitting: Family Medicine

## 2023-03-26 NOTE — Telephone Encounter (Signed)
 Unable to reach pt over the phone, no VM box. Sent mychart msg and letter in mail informing pt of need to reschedule 05/21/23 appt - NP out

## 2023-04-01 ENCOUNTER — Ambulatory Visit (INDEPENDENT_AMBULATORY_CARE_PROVIDER_SITE_OTHER): Payer: Medicare HMO | Admitting: Family

## 2023-04-01 ENCOUNTER — Encounter: Payer: Self-pay | Admitting: Family

## 2023-04-01 VITALS — BP 100/70 | HR 65 | Temp 98.1°F | Resp 19 | Ht 67.0 in

## 2023-04-01 DIAGNOSIS — Z0000 Encounter for general adult medical examination without abnormal findings: Secondary | ICD-10-CM | POA: Diagnosis not present

## 2023-04-01 NOTE — Progress Notes (Signed)
 Subjective:   [name] is a 73 y.o. female who presents for Medicare Annual (Subsequent) preventive examination.  Visit Complete: In person  Patient Medicare AWV questionnaire was completed by the patient on 04/01/2023 ; I have confirmed that all information answered by patient is correct and no changes since this date.  Cardiac Risk Factors include: advanced age (>68men, >49 women);hypertension;sedentary lifestyle;smoking/ tobacco exposure     Objective:    Today's Vitals   04/01/23 1116  BP: 100/70  Pulse: 65  Resp: 19  Temp: 98.1 F (36.7 C)  SpO2: 97%  Height: 5\' 7"  (1.702 m)   Body mass index is 21.9 kg/m.     04/01/2023   11:12 AM 01/09/2023    9:26 AM 12/05/2022    9:26 AM 11/06/2022    9:56 AM 10/03/2022    9:43 AM 09/12/2022    9:08 AM 08/23/2022    9:19 AM  Advanced Directives  Does Patient Have a Medical Advance Directive? No No No No No No Yes  Type of Advance Directive       Healthcare Power of Attorney  Does patient want to make changes to medical advance directive?  No - Patient declined No - Patient declined No - Patient declined No - Patient declined No - Patient declined No - Patient declined  Copy of Healthcare Power of Attorney in Chart?       No - copy requested  Would patient like information on creating a medical advance directive? No - Patient declined   No - Patient declined       Current Medications (verified) Outpatient Encounter Medications as of 04/01/2023  Medication Sig   acetaminophen (TYLENOL) 325 MG tablet Take 650 mg by mouth 2 (two) times a week. As needed   atorvastatin (LIPITOR) 80 MG tablet Take 1 tablet (80 mg total) by mouth at bedtime.   Cholecalciferol 50 MCG (2000 UT) TABS Take 1 tablet by mouth daily in the afternoon.   Ensure (ENSURE) Take 237 mLs by mouth daily.   furosemide (LASIX) 20 MG tablet Take 2 tablets (40 mg total) by mouth daily. (Patient taking differently: Patient takes 1 tablet by mouth in the  morning and in the afternoon.)   JARDIANCE 10 MG TABS tablet TAKE 1 TABLET(10 MG) BY MOUTH DAILY   Lacosamide (VIMPAT) 100 MG TABS Take 1 tablet by mouth every morning and 1 tablet every night at bedtime   levETIRAcetam (KEPPRA) 750 MG tablet Take 2 tablets (1,500 mg total) by mouth 2 (two) times daily.   metoprolol tartrate (LOPRESSOR) 25 MG tablet Take 12.5 mg by mouth daily.   nystatin cream (MYCOSTATIN) Apply 1 Application topically 2 (two) times daily. (Patient taking differently: Apply 1 Application topically 2 (two) times daily. As needed)   sacubitril-valsartan (ENTRESTO) 97-103 MG Take 1 tablet by mouth 2 (two) times daily.   spironolactone (ALDACTONE) 25 MG tablet TAKE 1 TABLET(25 MG) BY MOUTH DAILY   warfarin (COUMADIN) 1 MG tablet TAKE 1 TABLET(1 MG) BY MOUTH DAILY   warfarin (COUMADIN) 5 MG tablet TAKE 1 TABLET(5 MG) BY MOUTH DAILY   No facility-administered encounter medications on file as of 04/01/2023.    Allergies (verified) Lexiscan [regadenoson]   History: Past Medical History:  Diagnosis Date   Adenomatous polyp of colon 09/2012   repeat colonoscopy in 5 years by Dr. Jarold Motto   CHF (congestive heart failure) (HCC)    EF 15-20% as of 05/28/11 (Dr Casimiro [facility]le [facility] summary)   CVA (  cerebral infarction) 12/2007   H/O heart bypass surgery 2022   Hemiparesis (HCC)    Hyperlipidemia    Hypertension    Seizures (HCC)    Sickle cell anemia (HCC)    Stroke (HCC)    Vitamin D deficiency 2010   Past Surgical History:  Procedure Laterality Date   ABDOMINAL HYSTERECTOMY     BIV PACEMAKER INSERTION CRT-P N/A 04/29/2020   Procedure: BIV PACEMAKER INSERTION CRT-P;  Surgeon: Regan Lemming, MD;  Location: [facility] INVASIVE CV LAB;  Service: Cardiovascular;  Laterality: N/A;   CORONARY ARTERY BYPASS GRAFT N/A 04/25/2020   Procedure: CORONARY ARTERY BYPASS GRAFTING (CABG) TIMES TWO USING LEFT INTERNAL MAMMARY ARTERY AND RIGHT GREATER SAPHENOUS VEIN HARVESTED ENDOSCOPICALLY;   Surgeon: Loreli Slot, MD;  Location: [facility] OR;  Service: Open Heart Surgery;  Laterality: N/A;   FRACTURE SURGERY     HIP SURGERY     LEFT HEART CATH AND CORONARY ANGIOGRAPHY N/A 04/22/2020   Procedure: LEFT HEART CATH AND CORONARY ANGIOGRAPHY;  Surgeon: Laurey Morale, MD;  Location: [facility] INVASIVE CV LAB;  Service: Cardiovascular;  Laterality: N/A;   TEE WITHOUT CARDIOVERSION  04/25/2020   Procedure: TRANSESOPHAGEAL ECHOCARDIOGRAM (TEE);  Surgeon: Loreli Slot, MD;  Location: [facility] OR;  Service: Open Heart Surgery;;   TEMPORARY PACEMAKER N/A 04/21/2020   Procedure: TEMPORARY PACEMAKER;  Surgeon: Laurey Morale, MD;  Location: [facility] INVASIVE CV LAB;  Service: Cardiovascular;  Laterality: N/A;   TUBAL LIGATION     Family History  Problem Relation Age of Onset   Diabetes Daughter    High Cholesterol Daughter    Hypertension Daughter    Heart disease Daughter    Hypertension Sister    High Cholesterol Sister    Asthma Brother    Bronchitis Brother    Colon cancer Maternal Grandfather    Social History   Socioeconomic History   Marital status: Married    Spouse name: [name]   Number of children: 2   Years of education: 12   Highest education level: Not on file  Occupational History    Employer: RETIRED  Tobacco Use   Smoking status: Former    Current packs/day: 0.00    Types: Cigarettes    Quit date: 09/26/2007    Years since quitting: 15.5   Smokeless tobacco: Never  Vaping Use   Vaping status: Never Used  Substance and Sexual Activity   Alcohol use: Not Currently    Comment: Former drinker, 15 years ago   Drug use: Never   Sexual activity: Not Currently  Other Topics Concern   Not on file  Social History Narrative   Tobacco use, amount per day now: None.   Past tobacco use, amount per day: Everyday.   How many years did you use tobacco: 30 years.   Alcohol use (drinks per week): Wine Daily   Diet:   Do you drink/eat things with caffeine: Yes   Marital  status: Married                                  What year were you married?    Do you live in a house, apartment, assisted living, condo, trailer, etc.? House   Is it one or more stories? 1 story.   How many persons live in your home? 2   Do you have pets in your home?( please list) No   Highest Level of education  completed? Associate   Current or past profession: Director    Do you exercise?    No                             Type and how often?   Do you have a living will?    Do you have a DNR form?                                   If not, do you want to discuss one?   Do you have signed POA/HPOA forms?                        If so, please bring to you appointment      Do you have any difficulty bathing or dressing yourself? Yes   Do you have any difficulty preparing food or eating? Yes   Do you have any difficulty managing your medications? Yes   Do you have any difficulty managing your finances? Yes   Do you have any difficulty affording your medications? Yes   Social Drivers of Corporate investment banker Strain: Not on file  Food Insecurity: No Food Insecurity (10/20/2021)   Hunger Vital Sign    Worried About Running Out of Food in the Last Year: Never true    Ran Out of Food in the Last Year: Never true  Transportation Needs: No Transportation Needs (10/20/2021)   PRAPARE - Administrator, Civil Service (Medical): No    Lack of Transportation (Non-Medical): No  Physical Activity: Not on file  Stress: Not on file  Social Connections: Not on file    Tobacco Counseling Counseling given: Not Answered   Clinical Intake:  Pre-visit preparation completed: No  Pain : No/denies pain     BMI - recorded: 21.9 Nutritional Status: BMI of 19-24  Normal Nutritional Risks: None Diabetes: No  How often do you need to have someone help you when you read instructions, pamphlets, or other written materials from your doctor or pharmacy?: 5 - Always What is the last  grade level you completed in school?: 12 grade and some community college  Interpreter Needed?: No      Activities of Daily Living    04/01/2023   11:40 AM  In your present state of health, do you have any difficulty performing the following activities:  Hearing? 1  Vision? 0  Difficulty concentrating or making decisions? 1  Comment making decision due to hx of CVA  Walking or climbing stairs? 1  Comment uses a cane and wheelchair  Dressing or bathing? 1  Comment Husband assist  Doing errands, shopping? 1  Comment Husband assist  Preparing Food and eating ? Y  Comment Husband assist  Using the Toilet? N  In the past six months, have you accidently leaked urine? N  Do you have problems with loss of bowel control? N  Managing your Medications? N  Managing your Finances? Y  Comment Husband assist  Housekeeping or managing your Housekeeping? Y  Comment Husband assist    Patient Care Team: Venita Sheffield, MD as PCP - General (Internal Medicine) Regan Lemming, MD as PCP - Electrophysiology (Cardiology) Jake Bathe, MD as PCP - Cardiology (Cardiology) Yates Decamp, MD as Attending Physician (Cardiology) Geoffry Paradise, MD (Internal Medicine) Chilton Si, MD as Attending Physician (  Cardiology) Marjory Lies, Glenford Bayley, MD as Consulting Physician (Neurology) Clarene Duke, Karma Lew, RN as [facility] Care Management  Indicate any recent Medical Services you may have received from other than [facility] providers in the past year (date may be approximate).     Assessment:   This is a routine wellness examination for Keisa.  Hearing/Vision screen No results found.   Goals Addressed   None    Depression Screen    04/01/2023   11:12 AM 01/09/2023    9:36 AM 09/12/2022    9:08 AM 08/23/2022    9:18 AM 03/28/2022   11:19 AM 04/12/2020    9:09 AM 03/02/2020   10:42 AM  PHQ 2/9 Scores  PHQ - 2 Score 0  0 0 0 0 0  Exception Documentation  Other- indicate  reason in comment box       Not completed  dysarthria         Fall Risk    04/01/2023   11:12 AM 01/09/2023    9:36 AM 01/09/2023    9:25 AM 12/05/2022    9:26 AM 11/06/2022    9:56 AM  Fall Risk   Falls in the past year? 0 0 0 0 0  Number falls in past yr: 0 0 0 0 0  Injury with Fall? 0 0 0 0 0  Risk for fall due to : No Fall Risks Impaired balance/gait No Fall Risks No Fall Risks No Fall Risks  Follow up Falls evaluation completed;Education provided;Falls prevention discussed Education provided Falls evaluation completed;Education provided;Falls prevention discussed Falls evaluation completed;Education provided;Falls prevention discussed Falls evaluation completed    MEDICARE RISK AT HOME:    TIMED UP AND GO:  Was the test performed?  No    Cognitive Function:    03/28/2022   11:23 AM  MMSE - Mini Mental State Exam  Not completed: Unable to complete        04/01/2023   11:44 AM  6CIT Screen  What Year? --    Immunizations Immunization History  Administered Date(s) Administered   Fluad Quad(high Dose 65+) 10/10/2018, 10/26/2020, 11/29/2021   Fluad Trivalent(High Dose 65+) 12/05/2022   Influenza,inj,Quad PF,6+ Mos 12/12/2012, 10/23/2013, 09/30/2014, 04/05/2016   PFIZER Comirnaty(Gray Top)Covid-19 Tri-Sucrose Vaccine 05/04/2020   PFIZER(Purple Top)SARS-COV-2 Vaccination 03/22/2019, 04/15/2019   Pneumococcal Conjugate-13 09/30/2014   Pneumococcal Polysaccharide-23 04/05/2016   Tdap 12/12/2012    TDAP status: Due, Education has been provided regarding the importance of this vaccine. Advised may receive this vaccine at local pharmacy or Health Dept. Aware to provide a copy of the vaccination record if obtained from local pharmacy or Health Dept. Verbalized acceptance and understanding.  Flu Vaccine status: Up to date  Pneumococcal vaccine status: Up to date  Covid-19 vaccine status: Information provided on how to obtain vaccines.   Qualifies for Shingles Vaccine?  Yes   Zostavax completed No   Shingrix Completed?: No.    Education has been provided regarding the importance of this vaccine. Patient has been advised to call insurance company to determine out of pocket expense if they have not yet received this vaccine. Advised may also receive vaccine at local pharmacy or Health Dept. Verbalized acceptance and understanding.  Screening Tests Health Maintenance  Topic Date Due   Fecal DNA (Cologuard)  Never done   DTaP/Tdap/Td (2 - Td or Tdap) 04/03/2023 (Originally 12/13/2022)   Zoster Vaccines- Shingrix (1 of 2) 05/09/2023 (Originally 01/09/1970)   COVID-19 Vaccine (4 - 2024-25 season) 05/15/2023 (Originally 09/23/2022)  MAMMOGRAM  12/12/2023   Medicare Annual Wellness (AWV)  03/31/2024   Pneumonia Vaccine 78+ Years old  Completed   INFLUENZA VACCINE  Completed   DEXA SCAN  Completed   Hepatitis C Screening  Completed   HPV VACCINES  Aged Out   Colonoscopy  Discontinued    Health Maintenance  Health Maintenance Due  Topic Date Due   Fecal DNA (Cologuard)  Never done    Colorectal cancer screening: Referral to GI placed Has a cologuad kit at home . Pt aware the office will call re: appt.  Mammogram status: Completed 12/11/2021. Repeat every year  Bone Density status: Completed 05/24/2023. Results reflect: Bone density results: OSTEOPOROSIS. Repeat every 2  years.  Lung Cancer Screening: (Low Dose CT Chest recommended if Age 61-80 years, 20 pack-year currently smoking OR have quit w/in 15years.) does not qualify.   Lung Cancer Screening Referral: N/A   Additional Screening:  Hepatitis C Screening: does qualify; Completed yes   Vision Screening: Recommended annual ophthalmology exams for early detection of glaucoma and other disorders of the eye. Is the patient up to date with their annual eye exam?  No  Who is the provider or what is the name of the office in which the patient attends annual eye exams? None  If pt is not established with  a provider, would they like to be referred to a provider to establish care? No .   Dental Screening: Recommended annual dental exams for proper oral hygiene  Diabetic Foot Exam: Diabetic Foot Exam: Completed N/A   Community Resource Referral / Chronic Care Management: CRR required this visit?  No   CCM required this visit?  No     Plan:     I have personally reviewed and noted the following in the patient's chart:   Medical and social history Use of alcohol, tobacco or illicit drugs  Current medications and supplements including opioid prescriptions. Patient is not currently taking opioid prescriptions. Functional ability and status Nutritional status Physical activity Advanced directives List of other physicians Hospitalizations, surgeries, and ER visits in previous 12 months Vitals Screenings to include cognitive, depression, and falls Referrals and appointments  In addition, I have reviewed and discussed with patient certain preventive protocols, quality metrics, and best practice recommendations. A written personalized care plan for preventive services as well as general preventive health recommendations were provided to patient.     Caesar Bookman, NP   04/01/2023   After Visit Summary: (In Person-Printed) AVS printed and given to the patient  Nurse Notes: Advised to get Tdap,COVID-19 and Shingles vaccine at the pharmacy

## 2023-04-01 NOTE — Patient Instructions (Signed)
 [name] , Thank you for taking time to come for your Medicare Wellness Visit. I appreciate your ongoing commitment to your health goals. Please review the following plan we discussed and let me know if I can assist you in the future.   Screening recommendations/referrals: Colonoscopy : Due  Mammogram : Up to date  Bone Density : Up to date  Recommended yearly ophthalmology/optometry visit for glaucoma screening and checkup Recommended yearly dental visit for hygiene and checkup  Vaccinations: Influenza vaccine- due annually in September/October Pneumococcal vaccine : Up to date  Tdap vaccine : Due please get vaccine at the pharmacy  Shingles vaccine  : Due please get vaccine at the pharmacy     Advanced directives: No   Conditions/risks identified: advanced age (>80men, >61 women);hypertension;sedentary lifestyle;smoking/ tobacco exposure  Next appointment: 1 year    Preventive Care 15 Years and Older, Female Preventive care refers to lifestyle choices and visits with your health care provider that can promote health and wellness. What does preventive care include? A yearly physical exam. This is also called an annual well check. Dental exams once or twice a year. Routine eye exams. Ask your health care provider how often you should have your eyes checked. Personal lifestyle choices, including: Daily care of your teeth and gums. Regular physical activity. Eating a healthy diet. Avoiding tobacco and drug use. Limiting alcohol use. Practicing safe sex. Taking low-dose aspirin every day. Taking vitamin and mineral supplements as recommended by your health care provider. What happens during an annual well check? The services and screenings done by your health care provider during your annual well check will depend on your age, overall health, lifestyle risk factors, and family history of disease. Counseling  Your health care provider may ask you questions about your: Alcohol  use. Tobacco use. Drug use. Emotional well-being. Home and relationship well-being. Sexual activity. Eating habits. History of falls. Memory and ability to understand (cognition). Work and work Astronomer. Reproductive health. Screening  You may have the following tests or measurements: Height, weight, and BMI. Blood pressure. Lipid and cholesterol levels. These may be checked every 5 years, or more frequently if you are over 41 years old. Skin check. Lung cancer screening. You may have this screening every year starting at age 70 if you have a 30-pack-year history of smoking and currently smoke or have quit within the past 15 years. Fecal occult blood test (FOBT) of the stool. You may have this test every year starting at age 27. Flexible sigmoidoscopy or colonoscopy. You may have a sigmoidoscopy every 5 years or a colonoscopy every 10 years starting at age 44. Hepatitis C blood test. Hepatitis B blood test. Sexually transmitted disease (STD) testing. Diabetes screening. This is done by checking your blood sugar (glucose) after you have not eaten for a while (fasting). You may have this done every 1-3 years. Bone density scan. This is done to screen for osteoporosis. You may have this done starting at age 61. Mammogram. This may be done every 1-2 years. Talk to your health care provider about how often you should have regular mammograms. Talk with your health care provider about your test results, treatment options, and if necessary, the need for more tests. Vaccines  Your health care provider may recommend certain vaccines, such as: Influenza vaccine. This is recommended every year. Tetanus, diphtheria, and acellular pertussis (Tdap, Td) vaccine. You may need a Td booster every 10 years. Zoster vaccine. You may need this after age 13. Pneumococcal 13-valent  conjugate (PCV13) vaccine. One dose is recommended after age 29. Pneumococcal polysaccharide (PPSV23) vaccine. One dose is  recommended after age 45. Talk to your health care provider about which screenings and vaccines you need and how often you need them. This information is not intended to replace advice given to you by your health care provider. Make sure you discuss any questions you have with your health care provider. Document Released: 02/04/2015 Document Revised: 09/28/2015 Document Reviewed: 11/09/2014 Elsevier Interactive Patient Education  2017 [employer].  Fall Prevention in the Home Falls can cause injuries. They can happen to people of all ages. There are many things you can do to make your home safe and to help prevent falls. What can I do on the outside of my home? Regularly fix the edges of walkways and driveways and fix any cracks. Remove anything that might make you trip as you walk through a door, such as a raised step or threshold. Trim any bushes or trees on the path to your home. Use bright outdoor lighting. Clear any walking paths of anything that might make someone trip, such as rocks or tools. Regularly check to see if handrails are loose or broken. Make sure that both sides of any steps have handrails. Any raised decks and porches should have guardrails on the edges. Have any leaves, snow, or ice cleared regularly. Use sand or salt on walking paths during winter. Clean up any spills in your garage right away. This includes oil or grease spills. What can I do in the bathroom? Use night lights. Install grab bars by the toilet and in the tub and shower. Do not use towel bars as grab bars. Use non-skid mats or decals in the tub or shower. If you need to sit down in the shower, use a plastic, non-slip stool. Keep the floor dry. Clean up any water that spills on the floor as soon as it happens. Remove soap buildup in the tub or shower regularly. Attach bath mats securely with double-sided non-slip rug tape. Do not have throw rugs and other things on the floor that can make you  trip. What can I do in the bedroom? Use night lights. Make sure that you have a light by your bed that is easy to reach. Do not use any sheets or blankets that are too big for your bed. They should not hang down onto the floor. Have a firm chair that has side arms. You can use this for support while you get dressed. Do not have throw rugs and other things on the floor that can make you trip. What can I do in the kitchen? Clean up any spills right away. Avoid walking on wet floors. Keep items that you use a lot in easy-to-reach places. If you need to reach something above you, use a strong step stool that has a grab bar. Keep electrical cords out of the way. Do not use floor polish or wax that makes floors slippery. If you must use wax, use non-skid floor wax. Do not have throw rugs and other things on the floor that can make you trip. What can I do with my stairs? Do not leave any items on the stairs. Make sure that there are handrails on both sides of the stairs and use them. Fix handrails that are broken or loose. Make sure that handrails are as long as the stairways. Check any carpeting to make sure that it is firmly attached to the stairs. Fix any carpet that  is loose or worn. Avoid having throw rugs at the top or bottom of the stairs. If you do have throw rugs, attach them to the floor with carpet tape. Make sure that you have a light switch at the top of the stairs and the bottom of the stairs. If you do not have them, ask someone to add them for you. What else can I do to help prevent falls? Wear shoes that: Do not have high heels. Have rubber bottoms. Are comfortable and fit you well. Are closed at the toe. Do not wear sandals. If you use a stepladder: Make sure that it is fully opened. Do not climb a closed stepladder. Make sure that both sides of the stepladder are locked into place. Ask someone to hold it for you, if possible. Clearly mark and make sure that you can  see: Any grab bars or handrails. First and last steps. Where the edge of each step is. Use tools that help you move around (mobility aids) if they are needed. These include: Canes. Walkers. Scooters. Crutches. Turn on the lights when you go into a dark area. Replace any light bulbs as soon as they burn out. Set up your furniture so you have a clear path. Avoid moving your furniture around. If any of your floors are uneven, fix them. If there are any pets around you, be aware of where they are. Review your medicines with your doctor. Some medicines can make you feel dizzy. This can increase your chance of falling. Ask your doctor what other things that you can do to help prevent falls. This information is not intended to replace advice given to you by your health care provider. Make sure you discuss any questions you have with your health care provider. Document Released: 11/04/2008 Document Revised: 06/16/2015 Document Reviewed: 02/12/2014 Elsevier Interactive Patient Education  2017 [employer].

## 2023-04-10 ENCOUNTER — Ambulatory Visit (INDEPENDENT_AMBULATORY_CARE_PROVIDER_SITE_OTHER): Admitting: Sports Medicine

## 2023-04-10 ENCOUNTER — Encounter: Payer: Self-pay | Admitting: Sports Medicine

## 2023-04-10 VITALS — BP 118/75 | HR 76 | Temp 96.3°F | Resp 18 | Ht 67.0 in | Wt 139.0 lb

## 2023-04-10 DIAGNOSIS — Z8673 Personal history of transient ischemic attack (TIA), and cerebral infarction without residual deficits: Secondary | ICD-10-CM

## 2023-04-10 DIAGNOSIS — Z7901 Long term (current) use of anticoagulants: Secondary | ICD-10-CM

## 2023-04-10 DIAGNOSIS — I442 Atrioventricular block, complete: Secondary | ICD-10-CM

## 2023-04-10 LAB — POCT INR
INR: 2.9 (ref 2.0–3.0)
POC INR: 2.9

## 2023-04-10 NOTE — Progress Notes (Signed)
 Careteam: Patient Care Team: Venita Sheffield, MD as PCP - General (Internal Medicine) Regan Lemming, MD as PCP - Electrophysiology (Cardiology) Jake Bathe, MD as PCP - Cardiology (Cardiology) Yates Decamp, MD as Attending Physician (Cardiology) Geoffry Paradise, MD (Internal Medicine) Chilton Si, MD as Attending Physician (Cardiology) Marjory Lies, Glenford Bayley, MD as Consulting Physician (Neurology) Clarene Duke, Karma Lew, RN as [facility] Care Management  PLACE OF SERVICE:  [facility]  Advanced Directive information    Allergies  Allergen Reactions   Lexiscan [Regadenoson] Other (See Comments)    Perioral tingling and throat tightness    Chief Complaint  Patient presents with   Medical Management of Chronic Issues    one week with Dr.Jury Caserta for INR check      Discussed the use of AI scribe software for clinical note transcription with the patient, who gave verbal consent to proceed.  History of Present Illness   [name] is a 73 year old female with a history of stroke who presents for an INR check.  Her current INR is 2.9, which is within the therapeutic range of 2 to 3. She is taking Coumadin 6 mg daily, consisting of a 5 mg and a 1 mg tablet. No episodes of blood in the stool or dark-colored stools have occurred.  She has a history of stroke in 2009, which led to her being placed on Coumadin to prevent further strokes.  There is ongoing discussion with her cardiologist and neurologist regarding the necessity of continuing Coumadin  and there is a risk of stroke if Coumadin is discontinued. She was previously on low-dose aspirin in addition to Coumadin, but was advised to stop aspirin due to the blood-thinning effects of Coumadin.  In February, she had all her teeth removed, during which Coumadin was stopped for five days and then restarted. She uses a cane with a three-point base for mobility at home and has not experienced any  falls in the last six months. She sometimes has difficulty getting out of her recliner and uses one arm to assist herself.    She wears pull-ups all the time and requires assistance with bathing, although she can wash up on her own. She has no issues with appetite, breathing, or joint pain. No chest pain, stomach pain, dizziness, or pain during urination. Her mood is generally stable, though she occasionally appears 'mad'.  Her last lab work in January showed  her creatinine level improved to 1.18 two months ago. Her hemoglobin levels are good. She has a Cologuard kit at home for stool testing, which she has not yet completed.     Review of Systems:  Review of Systems  Constitutional:  Negative for chills and fever.  HENT:  Negative for congestion and sore throat.   Eyes:  Negative for double vision.  Respiratory:  Negative for cough, sputum production and shortness of breath.   Cardiovascular:  Negative for chest pain, palpitations and leg swelling.  Gastrointestinal:  Negative for abdominal pain, heartburn and nausea.  Genitourinary:  Negative for dysuria, frequency and hematuria.  Musculoskeletal:  Negative for falls and myalgias.  Neurological:  Negative for dizziness, sensory change and focal weakness.   Negative unless indicated in HPI.   Past Medical History:  Diagnosis Date   Adenomatous polyp of colon 09/2012   repeat colonoscopy in 5 years by Dr. Jarold Motto   CHF (congestive heart failure) (HCC)    EF 15-20% as of 05/28/11 (Dr Casimiro [facility] summary)  CVA (cerebral infarction) 12/2007   H/O heart bypass surgery 2022   Hemiparesis (HCC)    Hyperlipidemia    Hypertension    Seizures (HCC)    Sickle cell anemia (HCC)    Stroke (HCC)    Vitamin D deficiency 2010   Past Surgical History:  Procedure Laterality Date   ABDOMINAL HYSTERECTOMY     BIV PACEMAKER INSERTION CRT-P N/A 04/29/2020   Procedure: BIV PACEMAKER INSERTION CRT-P;  Surgeon: Regan Lemming,  MD;  Location: [facility] INVASIVE CV LAB;  Service: Cardiovascular;  Laterality: N/A;   CORONARY ARTERY BYPASS GRAFT N/A 04/25/2020   Procedure: CORONARY ARTERY BYPASS GRAFTING (CABG) TIMES TWO USING LEFT INTERNAL MAMMARY ARTERY AND RIGHT GREATER SAPHENOUS VEIN HARVESTED ENDOSCOPICALLY;  Surgeon: Loreli Slot, MD;  Location: [facility] OR;  Service: Open Heart Surgery;  Laterality: N/A;   FRACTURE SURGERY     HIP SURGERY     LEFT HEART CATH AND CORONARY ANGIOGRAPHY N/A 04/22/2020   Procedure: LEFT HEART CATH AND CORONARY ANGIOGRAPHY;  Surgeon: Laurey Morale, MD;  Location: [facility] INVASIVE CV LAB;  Service: Cardiovascular;  Laterality: N/A;   TEE WITHOUT CARDIOVERSION  04/25/2020   Procedure: TRANSESOPHAGEAL ECHOCARDIOGRAM (TEE);  Surgeon: Loreli Slot, MD;  Location: [facility] OR;  Service: Open Heart Surgery;;   TEMPORARY PACEMAKER N/A 04/21/2020   Procedure: TEMPORARY PACEMAKER;  Surgeon: Laurey Morale, MD;  Location: [facility] INVASIVE CV LAB;  Service: Cardiovascular;  Laterality: N/A;   TUBAL LIGATION     Social History:   reports that she quit smoking about 15 years ago. Her smoking use included cigarettes. She has never used smokeless tobacco. She reports that she does not currently use alcohol. She reports that she does not use drugs.  Family History  Problem Relation Age of Onset   Diabetes Daughter    High Cholesterol Daughter    Hypertension Daughter    Heart disease Daughter    Hypertension Sister    High Cholesterol Sister    Asthma Brother    Bronchitis Brother    Colon cancer Maternal Grandfather     Medications: Patient's Medications  New Prescriptions   No medications on file  Previous Medications   ACETAMINOPHEN (TYLENOL) 325 MG TABLET    Take 650 mg by mouth 2 (two) times a week. As needed   ATORVASTATIN (LIPITOR) 80 MG TABLET    Take 1 tablet (80 mg total) by mouth at bedtime.   CHOLECALCIFEROL 50 MCG (2000 UT) TABS    Take 1 tablet by mouth daily in the afternoon.   ENSURE  (ENSURE)    Take 237 mLs by mouth daily.   FUROSEMIDE (LASIX) 20 MG TABLET    Take 2 tablets (40 mg total) by mouth daily.   JARDIANCE 10 MG TABS TABLET    TAKE 1 TABLET(10 MG) BY MOUTH DAILY   LACOSAMIDE (VIMPAT) 100 MG TABS    Take 1 tablet by mouth every morning and 1 tablet every night at bedtime   LEVETIRACETAM (KEPPRA) 750 MG TABLET    Take 2 tablets (1,500 mg total) by mouth 2 (two) times daily.   METOPROLOL TARTRATE (LOPRESSOR) 25 MG TABLET    Take 12.5 mg by mouth daily.   NYSTATIN CREAM (MYCOSTATIN)    Apply 1 Application topically 2 (two) times daily.   SACUBITRIL-VALSARTAN (ENTRESTO) 97-103 MG    Take 1 tablet by mouth 2 (two) times daily.   SPIRONOLACTONE (ALDACTONE) 25 MG TABLET    TAKE 1 TABLET(25 MG) BY  MOUTH DAILY   WARFARIN (COUMADIN) 1 MG TABLET    TAKE 1 TABLET(1 MG) BY MOUTH DAILY   WARFARIN (COUMADIN) 5 MG TABLET    TAKE 1 TABLET(5 MG) BY MOUTH DAILY  Modified Medications   No medications on file  Discontinued Medications   No medications on file    Physical Exam: Vitals:   04/10/23 0935  BP: 118/75  Pulse: 76  Resp: 18  Temp: (!) 96.3 F (35.7 C)  SpO2: 99%  Weight: 139 lb (63 kg)  Height: 5\' 7"  (1.702 m)   Body mass index is 21.77 kg/m. BP Readings from Last 3 Encounters:  04/10/23 118/75  04/01/23 100/70  01/25/23 104/66   Wt Readings from Last 3 Encounters:  04/10/23 139 lb (63 kg)  01/25/23 139 lb 12.8 oz (63.4 kg)  12/27/22 139 lb 12.8 oz (63.4 kg)    Physical Exam Constitutional:      Appearance: Normal appearance.  HENT:     Head: Normocephalic and atraumatic.  Cardiovascular:     Rate and Rhythm: Normal rate and regular rhythm.  Pulmonary:     Effort: Pulmonary effort is normal. No respiratory distress.     Breath sounds: Normal breath sounds. No wheezing.  Abdominal:     General: Bowel sounds are normal. There is no distension.     Tenderness: There is no abdominal tenderness. There is no guarding or rebound.     Comments:     Musculoskeletal:        General: No swelling or tenderness.  Neurological:     Mental Status: She is alert. Mental status is at baseline.     Comments: Residual weakness on Rt upper extremity     Labs reviewed: Basic Metabolic Panel: Recent Labs    10/26/22 0929 12/27/22 0931 01/25/23 1000  NA 139 138 137  K 4.6 4.7 4.3  CL 105 107 105  CO2 21* 23 21*  GLUCOSE 78 79 95  BUN 22 28* 28*  CREATININE 1.25* 1.21* 1.18*  CALCIUM 8.8* 9.2 9.0   Liver Function Tests: Recent Labs    06/06/22 1009  AST 23  ALT 9  BILITOT 0.4  PROT 7.8   No results for input(s): "LIPASE", "AMYLASE" in the last 8760 hours. No results for input(s): "AMMONIA" in the last 8760 hours. CBC: Recent Labs    10/03/22 1010  WBC 5.9  NEUTROABS 3,463  HGB 12.5  HCT 38.6  MCV 88.1  PLT 304   Lipid Panel: Recent Labs    06/06/22 1009  CHOL 141  HDL 46*  LDLCALC 76  TRIG 409  CHOLHDL 3.1   TSH: No results for input(s): "TSH" in the last 8760 hours. A1C: Lab Results  Component Value Date   HGBA1C 6.1 (H) 06/06/2022    Assessment and Plan    Stroke prevention Neurologist initially prescribed Coumadin for stroke prevention. Cardiologist sees no cardiac indication for Coumadin but acknowledges potential benefit due to heart function. Risks of Coumadin include bleeding, but discontinuation may increase stroke risk. Shared decision-making with family to continue Coumadin until further input from neurology. - Send message to neurologist to discuss necessity of continuing Coumadin. - Discuss with neurologist during upcoming appointment whether to continue Coumadin or switch to aspirin or Plavix.  INR monitoring INR is 2.9, within the therapeutic range of 2 to 3 for patients on Coumadin. No signs of bleeding reported. - Continue current Coumadin dosage of 6 mg daily. - Return to clinic in one month for  INR check.  Complete heart block S/p pacemaker Had  pacemaker check up recently       Colorectal cancer screening Cologuard test kit provided but not yet completed. - Complete Cologuard test at home.          Return in about 4 weeks (around 05/08/2023), or for INR check.:

## 2023-04-27 ENCOUNTER — Other Ambulatory Visit: Payer: Self-pay | Admitting: Family

## 2023-04-27 DIAGNOSIS — Z7901 Long term (current) use of anticoagulants: Secondary | ICD-10-CM

## 2023-04-29 ENCOUNTER — Ambulatory Visit (INDEPENDENT_AMBULATORY_CARE_PROVIDER_SITE_OTHER): Payer: Medicare HMO

## 2023-04-29 DIAGNOSIS — I442 Atrioventricular block, complete: Secondary | ICD-10-CM | POA: Diagnosis not present

## 2023-04-29 NOTE — Telephone Encounter (Signed)
 High risk or very high risk warning populated when attempting to refill medication. RX request sent to PCP for review and approval if warranted.

## 2023-04-30 LAB — CUP PACEART REMOTE DEVICE CHECK
Battery Remaining Longevity: 55 mo
Battery Remaining Percentage: 57 %
Battery Voltage: 2.98 V
Brady Statistic AP VP Percent: 11 %
Brady Statistic AP VS Percent: 1 %
Brady Statistic AS VP Percent: 88 %
Brady Statistic AS VS Percent: 1 %
Brady Statistic RA Percent Paced: 9.8 %
Date Time Interrogation Session: 20250407020009
Implantable Lead Connection Status: 753985
Implantable Lead Connection Status: 753985
Implantable Lead Connection Status: 753985
Implantable Lead Implant Date: 20220411
Implantable Lead Implant Date: 20220411
Implantable Lead Implant Date: 20220411
Implantable Lead Location: 753858
Implantable Lead Location: 753859
Implantable Lead Location: 753860
Implantable Pulse Generator Implant Date: 20220411
Lead Channel Impedance Value: 350 Ohm
Lead Channel Impedance Value: 430 Ohm
Lead Channel Impedance Value: 580 Ohm
Lead Channel Pacing Threshold Amplitude: 0.625 V
Lead Channel Pacing Threshold Amplitude: 0.75 V
Lead Channel Pacing Threshold Amplitude: 0.75 V
Lead Channel Pacing Threshold Pulse Width: 0.5 ms
Lead Channel Pacing Threshold Pulse Width: 0.5 ms
Lead Channel Pacing Threshold Pulse Width: 0.5 ms
Lead Channel Sensing Intrinsic Amplitude: 3.7 mV
Lead Channel Sensing Intrinsic Amplitude: 9.6 mV
Lead Channel Setting Pacing Amplitude: 2 V
Lead Channel Setting Pacing Amplitude: 2 V
Lead Channel Setting Pacing Amplitude: 2 V
Lead Channel Setting Pacing Pulse Width: 0.5 ms
Lead Channel Setting Pacing Pulse Width: 0.5 ms
Lead Channel Setting Sensing Sensitivity: 4 mV
Pulse Gen Model: 3562
Pulse Gen Serial Number: 6262141

## 2023-05-15 ENCOUNTER — Telehealth (HOSPITAL_COMMUNITY): Payer: Self-pay | Admitting: Cardiology

## 2023-05-15 ENCOUNTER — Ambulatory Visit: Admitting: Sports Medicine

## 2023-05-15 ENCOUNTER — Encounter: Payer: Self-pay | Admitting: Podiatry

## 2023-05-15 ENCOUNTER — Ambulatory Visit: Payer: Medicare HMO | Admitting: Podiatry

## 2023-05-15 DIAGNOSIS — M79674 Pain in right toe(s): Secondary | ICD-10-CM

## 2023-05-15 DIAGNOSIS — M79675 Pain in left toe(s): Secondary | ICD-10-CM

## 2023-05-15 DIAGNOSIS — B351 Tinea unguium: Secondary | ICD-10-CM | POA: Diagnosis not present

## 2023-05-15 NOTE — Telephone Encounter (Signed)
 Called to confirm/remind patient of their appointment at the [facility] on 05/15/2023.   Appointment:   [x] Confirmed  [] Left mess   [] No answer/No voice mail  [] VM Full/unable to leave message  [] Phone not in service  Patient reminded to bring all medications and/or complete list.  Confirmed patient has transportation. Gave directions, instructed to utilize valet parking.

## 2023-05-16 ENCOUNTER — Other Ambulatory Visit (HOSPITAL_COMMUNITY): Payer: Self-pay

## 2023-05-16 ENCOUNTER — Telehealth (HOSPITAL_COMMUNITY): Payer: Self-pay

## 2023-05-16 ENCOUNTER — Ambulatory Visit (HOSPITAL_COMMUNITY)
Admission: RE | Admit: 2023-05-16 | Discharge: 2023-05-16 | Disposition: A | Discharge status: Home / Self Care | Source: Ambulatory Visit | Attending: *Deleted | Admitting: *Deleted

## 2023-05-16 ENCOUNTER — Encounter (HOSPITAL_COMMUNITY): Payer: Self-pay | Admitting: Cardiology

## 2023-05-16 ENCOUNTER — Ambulatory Visit (HOSPITAL_COMMUNITY)
Admission: RE | Admit: 2023-05-16 | Discharge: 2023-05-16 | Disposition: A | Discharge status: Home / Self Care | Source: Ambulatory Visit | Attending: Cardiology | Admitting: Cardiology

## 2023-05-16 VITALS — BP 120/64 | HR 61

## 2023-05-16 DIAGNOSIS — I5022 Chronic systolic (congestive) heart failure: Secondary | ICD-10-CM | POA: Diagnosis not present

## 2023-05-16 DIAGNOSIS — I69320 Aphasia following cerebral infarction: Secondary | ICD-10-CM | POA: Insufficient documentation

## 2023-05-16 DIAGNOSIS — I255 Ischemic cardiomyopathy: Secondary | ICD-10-CM | POA: Diagnosis not present

## 2023-05-16 DIAGNOSIS — I69351 Hemiplegia and hemiparesis following cerebral infarction affecting right dominant side: Secondary | ICD-10-CM | POA: Insufficient documentation

## 2023-05-16 DIAGNOSIS — I2510 Atherosclerotic heart disease of native coronary artery without angina pectoris: Secondary | ICD-10-CM | POA: Diagnosis not present

## 2023-05-16 DIAGNOSIS — Z006 Encounter for examination for normal comparison and control in clinical research program: Secondary | ICD-10-CM

## 2023-05-16 DIAGNOSIS — G40909 Epilepsy, unspecified, not intractable, without status epilepticus: Secondary | ICD-10-CM | POA: Diagnosis not present

## 2023-05-16 DIAGNOSIS — Z951 Presence of aortocoronary bypass graft: Secondary | ICD-10-CM | POA: Diagnosis not present

## 2023-05-16 DIAGNOSIS — I083 Combined rheumatic disorders of mitral, aortic and tricuspid valves: Secondary | ICD-10-CM | POA: Insufficient documentation

## 2023-05-16 DIAGNOSIS — E782 Mixed hyperlipidemia: Secondary | ICD-10-CM

## 2023-05-16 DIAGNOSIS — I5042 Chronic combined systolic (congestive) and diastolic (congestive) heart failure: Secondary | ICD-10-CM

## 2023-05-16 LAB — LIPID PANEL
Cholesterol: 125 mg/dL (ref 0–200)
HDL: 41 mg/dL (ref >40)
LDL Cholesterol: 68 mg/dL (ref 0–99)
Total CHOL/HDL Ratio: 3 ratio
Triglycerides: 80 mg/dL (ref <150)
VLDL: 16 mg/dL (ref 0–40)

## 2023-05-16 LAB — ECHOCARDIOGRAM COMPLETE
AR max vel: 1.21 cm2
AV Area VTI: 1.51 cm2
AV Area mean vel: 1.37 cm2
AV Mean grad: 3 mmHg
AV Peak grad: 8.9 mmHg
Ao pk vel: 1.49 m/s
Area-P 1/2: 5.84 cm2
Calc EF: 25.6 %
P 1/2 time: 699 ms
S' Lateral: 4.4 cm
Single Plane A2C EF: 13.9 %
Single Plane A4C EF: 35.7 %

## 2023-05-16 LAB — BASIC METABOLIC PANEL WITH GFR
Anion gap: 13 (ref 5–15)
BUN: 25 mg/dL — ABNORMAL HIGH (ref 8–23)
CO2: 20 mmol/L — ABNORMAL LOW (ref 22–32)
Calcium: 9.1 mg/dL (ref 8.9–10.3)
Chloride: 104 mmol/L (ref 98–111)
Creatinine, Ser: 1.39 mg/dL — ABNORMAL HIGH (ref 0.44–1.00)
GFR, Estimated: 40 mL/min — ABNORMAL LOW (ref >60)
Glucose, Bld: 77 mg/dL (ref 70–99)
Potassium: 4.2 mmol/L (ref 3.5–5.1)
Sodium: 137 mmol/L (ref 135–145)

## 2023-05-16 LAB — BRAIN NATRIURETIC PEPTIDE: B Natriuretic Peptide: 259.4 pg/mL — ABNORMAL HIGH (ref 0.0–100.0)

## 2023-05-16 MED ORDER — PERFLUTREN LIPID MICROSPHERE
1.0000 mL | INTRAVENOUS | Status: DC | PRN
  Administered 2023-05-16: 2 mL via INTRAVENOUS
  Filled 2023-05-16: qty 10

## 2023-05-16 MED ORDER — EZETIMIBE 10 MG PO TABS
10.0000 mg | ORAL_TABLET | Freq: Every day | ORAL | 3 refills | Status: AC
Start: 2023-05-16 — End: 2024-09-16

## 2023-05-16 MED ORDER — METOPROLOL SUCCINATE ER 25 MG PO TB24: 25.0000 mg | ORAL_TABLET | Freq: Every day | ORAL | 3 refills | Status: DC

## 2023-05-16 NOTE — Telephone Encounter (Addendum)
 -----   Message from Peder Bourdon sent at 05/16/2023  2:06 PM EDT ----- Goal LDL < 55.  Add Zetia  10 mg daily. Lipids 2 months.

## 2023-05-16 NOTE — Telephone Encounter (Signed)
 [facility] Patient Advocate Encounter  Patient considering switching to Eliquis . Test billing returns $47 for 30 day, $141 for 90 day supply.  Kennis Peacock, CPhT Rx Patient Advocate Phone: [phone]

## 2023-05-16 NOTE — Patient Instructions (Signed)
 STOP Metoprolol  Tartrate   START Toprol  XL 25 mg daily.  Labs done today, your results will be available in MyChart, we will contact you for abnormal readings.  Please follow up with our heart failure pharmacist as scheduled.  Your physician recommends that you schedule a follow-up appointment in: 4 months.  If you have any questions or concerns before your next appointment please send us  a message through [place] or call our office at [phone].    TO LEAVE A MESSAGE FOR THE NURSE SELECT OPTION 2, PLEASE LEAVE A MESSAGE INCLUDING: YOUR NAME DATE OF BIRTH CALL BACK NUMBER REASON FOR CALL**this is important as we prioritize the call backs  YOU WILL RECEIVE A CALL BACK THE SAME DAY AS LONG AS YOU CALL BEFORE 4:00 PM  At the [facility], you and your health needs are our priority. As part of our continuing mission to provide you with exceptional heart care, we have created designated Provider Care Teams. These Care Teams include your primary Cardiologist (physician) and Advanced Practice Providers (APPs- Physician Assistants and Nurse Practitioners) who all work together to provide you with the care you need, when you need it.   You may see any of the following providers on your designated Care Team at your next follow up: Dr Jules Oar Dr Peder Bourdon Dr. Alwin Baars Dr. Arta Lark Amy Marijane Shoulders, NP Ruddy Corral, [place] [facility] [place], [place] Dennise Fitz, NP [place] Lee, NP Shawnee Dellen, NP Luster Salters, PharmD Bevely Brush, PharmD   Please be sure to bring in all your medications bottles to every appointment.    Thank you for choosing [facility]

## 2023-05-16 NOTE — Research (Addendum)
 SITE: [id number]     Subject # [id number]  Subprotocol: A  Inclusion Criteria  Patients who meet all of the following criteria are eligible for enrollment as study participants:  Yes No  Age > 75 years old X   Eligible to wear Holter Study X    Exclusion Criteria  Patients who meet any of these criteria are not eligible for enrollment as study participants: Yes No  1. Receiving any mechanical (respiratory or circulatory) or renal support therapy at Screening or during Visit #1.  X  2.  Any other conditions that in the opinion of the investigators are likely to prevent compliance with the study protocol or pose a safety concern if the subject participates in the study.  X  3. Poor tolerance, namely susceptible to severe skin allergies from ECG adhesive patch application.  X   Protocol: REV H    60 minute start window         Cor device must be applied, and the study initiated, no later than 60 minutes of completing the Echocardiogram                             HH:MM  Echo completion time  09:19  2.   Cor Study start time  09:28   30-Minute execution window  Once Cor Monitoring begins, 3 QT Med ECGs and the 15-minute rest period must be completed within a 30 minute window     HH:MM  3. QT Med ECG Completion time  09:58  4. Start of 15-Min sitting rest period  09:30  5. End of 15-Min rest period  09:45  6. Time of device removal  10:00   *Continue to use the Mobile App Event feature to log the Rest period windows and follow instructions on the EF-ACT Clinical Trial  Patient Instruction Card.  Describe any anomalies in Protocol execution in the Protocol Deviation Log    Residential Zip code [phone] (First 3 digits ONLY)                                           [facility] Informed Consent   Subject Name: [name]  Subject met inclusion and exclusion criteria.  The informed consent form, study requirements and expectations were reviewed with the subject. Subject had opportunity  to read consent and questions and concerns were addressed prior to the signing of the consent form.  The subject verbalized understanding of the trial requirements.  The subject agreed to participate in the [facility] EF ACT trial and signed the informed consent at 09:24 on 16-May-2023.  The informed consent was obtained prior to performance of any protocol-specific procedures for the subject.  A copy of the signed informed consent was given to the subject and a copy was placed in the subject's medical record.   [name]          Current Outpatient Medications:    acetaminophen  (TYLENOL ) 325 MG tablet, Take 650 mg by mouth 2 (two) times a week. As needed, Disp: , Rfl:    atorvastatin  (LIPITOR ) 80 MG tablet, Take 1 tablet (80 mg total) by mouth at bedtime., Disp: 90 tablet, Rfl: 1   Cholecalciferol 50 MCG (2000 UT) TABS, Take 1 tablet by mouth daily in the afternoon., Disp: , Rfl:    Ensure (ENSURE), Take 237  mLs by mouth daily., Disp: , Rfl:    ezetimibe  (ZETIA ) 10 MG tablet, Take 1 tablet (10 mg total) by mouth daily., Disp: 90 tablet, Rfl: 3   furosemide  (LASIX ) 20 MG tablet, Take 2 tablets (40 mg total) by mouth daily., Disp: 180 tablet, Rfl: 3   JARDIANCE  10 MG TABS tablet, TAKE 1 TABLET(10 MG) BY MOUTH DAILY, Disp: 30 tablet, Rfl: 11   Lacosamide  (VIMPAT ) 100 MG TABS, Take 1 tablet by mouth every morning and 1 tablet every night at bedtime, Disp: 60 tablet, Rfl: 5   levETIRAcetam  (KEPPRA ) 750 MG tablet, Take 2 tablets (1,500 mg total) by mouth 2 (two) times daily., Disp: 360 tablet, Rfl: 3   metoprolol  succinate (TOPROL  XL) 25 MG 24 hr tablet, Take 1 tablet (25 mg total) by mouth daily., Disp: 90 tablet, Rfl: 3   nystatin  cream (MYCOSTATIN ), Apply 1 Application topically as needed for dry skin., Disp: , Rfl:    sacubitril -valsartan  (ENTRESTO ) 97-103 MG, Take 1 tablet by mouth 2 (two) times daily., Disp: 60 tablet, Rfl: 11   spironolactone  (ALDACTONE ) 25 MG tablet, TAKE 1 TABLET(25 MG)  BY MOUTH DAILY, Disp: 30 tablet, Rfl: 11   warfarin (COUMADIN ) 1 MG tablet, TAKE 1 TABLET(1 MG) BY MOUTH DAILY, Disp: 30 tablet, Rfl: 3   warfarin (COUMADIN ) 5 MG tablet, Take 1 tablet (5 mg total) by mouth daily., Disp: 30 tablet, Rfl: 0

## 2023-05-16 NOTE — Progress Notes (Signed)
*  PRELIMINARY RESULTS* Echocardiogram 2D Echocardiogram has been performed.  [name] 05/16/2023, 9:21 AM

## 2023-05-16 NOTE — Progress Notes (Signed)
 ADVANCED HF CLINIC NOTE   PCP: Tye Gall, MD HF Cardiology: Dr. Mitzie Anda  CC: CHF  73 y.o. with history of CVA, chronic systolic CHF, CHB, and CAD returns for followup of CHF.   She had a stroke in 2009, thought to be cardioembolic.  She has had right hemiparesis and aphasia since that time.   Ms. [name] was hospitalized in August 2020 with a seizure after being out of her medications for 4 days.  She required intubation. High-sensitivity troponin trended to 1905. An echocardiogram 08/2018 revealed EF of 35 to 40% with global hypokinesis and moderate LVH, elevated LVEDP. She also had moderate AR. Nuclear stress test in August 2020 showed a large severe fixed inferior, anterior apical, and inferolateral perfusion defect suggestive of scar without ischemia.  Study was high risk due to EF estimated at 34% but mentions no significant reversible ischemia. She was deemed not a good candidate for cardiac catheterization during that hospitalization.    Patient was admitted 3/22 following a syncopal episode and was found to be in complete heart block.  This improved initially after stopping nodal blockers.  Echo in 3/22 showed EF 35-40%.  LHC showed significant left main and LCx disease.  She was deemed to be a poor candidate for PCI.  In 4/22, she had CABG with LIMA-LAD and SVG-OM2.  Post-op, she developed CHB again.  St Jude CRT-P device was placed. She was discharged to a rehab facility.   Echo in 9/22 showed EF 35% with basal to mid inferior and inferolateral AK, mildly decreased RV function, mild-moderate MR.    Echo 12/23 EF 25-30%, RV mod reduced.  Echo was done today and reviewed, EF 20-25% with mild RV dysfunction and mild MR.   Today she returns for AHF follow up with her husband. Her husband helps provide most of her history with her expressive aphasia. She is stable symptomatically.  She can walk short distances with a cane without dyspnea.  No exertional chest pain. No  orthopnea/PND.    St Jude device interrogation (personally reviewed): Corvue stable,  >99% BiV, no AF  Labs (4/22): K 3.9, creatinine 0.83, hgb 9.4 Labs (7/22): K 4.2, creatinine 0.93 Labs (9/22): LDL 67 Labs (2/23): K 4.5, creatinine 1.12 Labs (3/23): K 4.5, creatinine 0.96 Labs (5/23): K 4.4, creatinine 1.19, LDL 67, HDL 46 Labs (8/23): K 4.4, creatinine 1.4 Labs (5/24): K 4.7, creatinine 1.25, A1c 6.1, LDL 76, LFTs normal Labs (96/04): 4.6, creatinine 1.25, BNP 558.1  Labs (12/24): K 4.7, creatinine 1.21 Labs (1/25): BNP 358, K 4.3, creatinine 1.18  PMH: 1. H/o seizure disorder.  2. CVA: 2009 with aphasia and right hemiparesis.  3. Complete heart block: St Jude CRT-P. 4. Chronic systolic CHF: Ischemic cardiomyopathy. St Jude CRT-P device.  - Echo (3/22): EF 35-40%, inferior and lateral HK, normal RV.  - Coronary angiography (4/22): 60-70% dLM, 60-70% ostial LCx, 99% proximal calcified LCx (LCx was large, dominant vessel).  - Echo (9/22): EF 35% with basal to mid inferior and inferolateral AK, mildly decreased RV function, mild-moderate MR. - Echo (4/25): EF 20-25% with mild RV dysfunction and mild MR.  5. CAD:  Coronary angiography (4/22) with 60-70% dLM, 60-70% ostial LCx, 99% proximal calcified LCx (LCx was large, dominant vessel). - CABG x 4 in 4/22: LIMA-LAD, SVG-OM2.  6. CHB: St Jude CRT-P placed in 4/22.   Social History   Socioeconomic History   Marital status: Married    Spouse name: [name]   Number of children:  2   Years of education: 81   Highest education level: Not on file  Occupational History    Employer: RETIRED  Tobacco Use   Smoking status: Former    Current packs/day: 0.00    Types: Cigarettes    Quit date: 09/26/2007    Years since quitting: 15.6   Smokeless tobacco: Never  Vaping Use   Vaping status: Never Used  Substance and Sexual Activity   Alcohol use: Not Currently    Comment: Former drinker, 15 years ago   Drug use: Never   Sexual  activity: Not Currently  Other Topics Concern   Not on file  Social History Narrative   Tobacco use, amount per day now: None.   Past tobacco use, amount per day: Everyday.   How many years did you use tobacco: 30 years.   Alcohol use (drinks per week): Wine Daily   Diet:   Do you drink/eat things with caffeine: Yes   Marital status: Married                                  What year were you married?    Do you live in a house, apartment, assisted living, condo, trailer, etc.? House   Is it one or more stories? 1 story.   How many persons live in your home? 2   Do you have pets in your home?( please list) No   Highest Level of education completed? Associate   Current or past profession: Director    Do you exercise?    No                             Type and how often?   Do you have a living will?    Do you have a DNR form?                                   If not, do you want to discuss one?   Do you have signed POA/HPOA forms?                        If so, please bring to you appointment      Do you have any difficulty bathing or dressing yourself? Yes   Do you have any difficulty preparing food or eating? Yes   Do you have any difficulty managing your medications? Yes   Do you have any difficulty managing your finances? Yes   Do you have any difficulty affording your medications? Yes   Social Drivers of Corporate investment banker Strain: Not on file  Food Insecurity: No Food Insecurity (10/20/2021)   Hunger Vital Sign    Worried About Running Out of Food in the Last Year: Never true    Ran Out of Food in the Last Year: Never true  Transportation Needs: No Transportation Needs (10/20/2021)   PRAPARE - Administrator, Civil Service (Medical): No    Lack of Transportation (Non-Medical): No  Physical Activity: Not on file  Stress: Not on file  Social Connections: Not on file  Intimate Partner Violence: Not on file   Family History  Problem Relation Age of Onset    Diabetes Daughter    High Cholesterol Daughter    Hypertension  Daughter    Heart disease Daughter    Hypertension Sister    High Cholesterol Sister    Asthma Brother    Bronchitis Brother    Colon cancer Maternal Grandfather    ROS: All systems reviewed and negative except as per HPI.   Current Outpatient Medications  Medication Sig Dispense Refill   acetaminophen  (TYLENOL ) 325 MG tablet Take 650 mg by mouth 2 (two) times a week. As needed     atorvastatin  (LIPITOR ) 80 MG tablet Take 1 tablet (80 mg total) by mouth at bedtime. 90 tablet 1   Cholecalciferol 50 MCG (2000 UT) TABS Take 1 tablet by mouth daily in the afternoon.     Ensure (ENSURE) Take 237 mLs by mouth daily.     furosemide  (LASIX ) 20 MG tablet Take 2 tablets (40 mg total) by mouth daily. 180 tablet 3   JARDIANCE  10 MG TABS tablet TAKE 1 TABLET(10 MG) BY MOUTH DAILY 30 tablet 11   Lacosamide  (VIMPAT ) 100 MG TABS Take 1 tablet by mouth every morning and 1 tablet every night at bedtime 60 tablet 5   levETIRAcetam  (KEPPRA ) 750 MG tablet Take 2 tablets (1,500 mg total) by mouth 2 (two) times daily. 360 tablet 3   metoprolol  succinate (TOPROL  XL) 25 MG 24 hr tablet Take 1 tablet (25 mg total) by mouth daily. 90 tablet 3   nystatin  cream (MYCOSTATIN ) Apply 1 Application topically as needed for dry skin.     sacubitril -valsartan  (ENTRESTO ) 97-103 MG Take 1 tablet by mouth 2 (two) times daily. 60 tablet 11   spironolactone  (ALDACTONE ) 25 MG tablet TAKE 1 TABLET(25 MG) BY MOUTH DAILY 30 tablet 11   warfarin (COUMADIN ) 1 MG tablet TAKE 1 TABLET(1 MG) BY MOUTH DAILY 30 tablet 3   warfarin (COUMADIN ) 5 MG tablet Take 1 tablet (5 mg total) by mouth daily. 30 tablet 0   ezetimibe  (ZETIA ) 10 MG tablet Take 1 tablet (10 mg total) by mouth daily. 90 tablet 3   No current facility-administered medications for this encounter.   Wt Readings from Last 3 Encounters:  04/10/23 63 kg (139 lb)  01/25/23 63.4 kg (139 lb 12.8 oz)  12/27/22 63.4  kg (139 lb 12.8 oz)   BP 120/64   Pulse 61   SpO2 97%   PHYSICAL EXAM: General: NAD Neck: No JVD, no thyromegaly or thyroid nodule.  Lungs: Clear to auscultation bilaterally with normal respiratory effort. CV: Nondisplaced PMI.  Heart regular S1/S2, no S3/S4, no murmur.  No peripheral edema.  No carotid bruit.  Normal pedal pulses.  Abdomen: Soft, nontender, no hepatosplenomegaly, no distention.  Skin: Intact without lesions or rashes.  Neurologic: Alert and oriented x 3. Right hemiparesis and expressive aphasia.  Psych: Normal affect. Extremities: No clubbing or cyanosis.  HEENT: Normal.   Asssessment/Plan: 1. Complete heart block: Patient had baseline NSR with LAFB/RBBB, so there was underlying conduction system disease.  She was transiently in CHB pre-CABG and developed persistent CHB post-CABG.  She now has Secondary school teacher CRT-P device.  99% BiV paced on device interrogation.  - followed by EP clinic 2. Chronic systolic CHF: Ischemic cardiomyopathy.  Echo in 8/20 with EF 35-40% and wall motion abnormalities. LHC in 4/22 showed left main/severe codominant LCx disease.  Echo in 3/22 with EF 35-40%, inferior and lateral hypokinesis.  Echo in 9/22 with EF 35%, mild RV dysfunction.  Now s/p CABG.  Echo 12/23 EF 25-30%, RV moderately reduced. Echo 4/25 with EF 20-25% with mild RV dysfunction  and mild MR.  NYHA class II, confounded by mobility issues from CVA. She is not volume overloaded by exam or Corvue. - Continue Lasix   40 mg daily. BMET today. - Increase Toprol  XL to 25 mg daily.  - Continue Entresto  97/103 bid.   - Continue spironolactone  25 mg daily.    - Continue Jardiance  10 mg daily. 3. 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 Has residual long-standing aphasia and right hemiparesis.   - Continue warfarin. INR followed by coumadin  clinic. We looked into transitioning to Eliquis  but she cannot afford Eliquis .  4. H/o seizure disorder: Continue Keppra .  - Followed by neurology  5. CAD:   LHC showed 60-70% distal left main stenosis, heavily calcified proximal LCx with 60-70% stenosis at the ostium and discrete 99% stenosis in the proximal LCx.  S/p CABG x 2 4/22 w/ LIMA-LAD, SVG-OM2.  No chest pain. - no ASA given Coumadin   - Continue statin. Check lipids today.   Follow up with HF pharmacist in 3 wks for med titration and in 3 months with APP  I spent 31 minutes reviewing records, interviewing/examining patient, and managing orders.   Peder Bourdon  05/16/2023

## 2023-05-19 ENCOUNTER — Encounter: Payer: Self-pay | Admitting: Podiatry

## 2023-05-19 NOTE — Progress Notes (Signed)
  Subjective:  Patient ID: [name], female    DOB: 1950-03-29,  MRN: [id number]  73 y.o. female presents at risk foot care with h/o clotting disorder and painful elongated mycotic toenails 1-5 bilaterally which are tender when wearing enclosed shoe gear. Pain is relieved with periodic professional debridement. Her husband is present during today's visit. Chief Complaint  Patient presents with   Nail Problem    Patient states she knows who her PCP is and her name is [name], patient last saw PCP a few months ago   New problem(s): None   PCP is Valerie Gall, MD.  Allergies  Allergen Reactions   Lexiscan  [Regadenoson ] Other (See Comments)    Perioral tingling and throat tightness    Review of Systems: Negative except as noted in the HPI.   Objective:  [name] is a pleasant 73 y.o. female in NAD. AAO x 3.  Vascular Examination: Vascular status intact b/l with palpable pedal pulses. CFT immediate b/l. Pedal hair present. No edema. No pain with calf compression b/l. Skin temperature gradient WNL b/l. No varicosities noted. No cyanosis or clubbing noted.  Neurological Examination: Sensation grossly intact b/l with 10 gram monofilament. Vibratory sensation intact b/l.  Dermatological Examination: Pedal skin with normal turgor, texture and tone b/l. No open wounds nor interdigital macerations noted. Toenails 1-5 b/l thick, discolored, elongated with subungual debris and pain on dorsal palpation. No hyperkeratotic lesions noted b/l.   Musculoskeletal Examination: Dropfoot right lower extremity. Muscle strength 5/5 to all LE muscle groups of left foot. Utilizes wheelchair for mobility assistance.  Radiographs: None  Last A1c:      Latest Ref Rng & Units 06/06/2022   10:09 AM  Hemoglobin A1C  Hemoglobin-A1c <5.7 % of total Hgb 6.1      Assessment:   1. Pain due to onychomycosis of toenails of both feet    Plan:  Consent given for  treatment. Patient examined. All patient's and/or POA's questions/concerns addressed on today's visit. Mycotic toenails 1-5 debrided in length and girth without incident. Continue soft, supportive shoe gear daily. Report any pedal injuries to medical professional. Call office if there are any quesitons/concerns. -Patient/POA to call should there be question/concern in the interim.  Return in about 3 months (around 08/14/2023).  Luella Sager, DPM       LOCATION: [address], [place] [address]                   Office [phone]   [facility] LOCATION: [address] [place], [place] [address] Office [phone]

## 2023-05-21 ENCOUNTER — Ambulatory Visit: Payer: Medicare HMO | Admitting: Family Medicine

## 2023-05-21 ENCOUNTER — Encounter: Payer: Self-pay | Admitting: Sports Medicine

## 2023-05-21 ENCOUNTER — Ambulatory Visit (INDEPENDENT_AMBULATORY_CARE_PROVIDER_SITE_OTHER): Admitting: Sports Medicine

## 2023-05-21 VITALS — BP 118/77 | HR 60 | Temp 96.6°F | Resp 18 | Ht 67.0 in | Wt 136.0 lb

## 2023-05-21 DIAGNOSIS — Z1211 Encounter for screening for malignant neoplasm of colon: Secondary | ICD-10-CM

## 2023-05-21 DIAGNOSIS — Z8673 Personal history of transient ischemic attack (TIA), and cerebral infarction without residual deficits: Secondary | ICD-10-CM

## 2023-05-21 DIAGNOSIS — I5042 Chronic combined systolic (congestive) and diastolic (congestive) heart failure: Secondary | ICD-10-CM

## 2023-05-21 DIAGNOSIS — Z7901 Long term (current) use of anticoagulants: Secondary | ICD-10-CM | POA: Diagnosis not present

## 2023-05-21 LAB — POCT INR: INR: 2.9 (ref 2.0–3.0)

## 2023-05-21 NOTE — Progress Notes (Signed)
 Careteam: Patient Care Team: Tye Gall, MD as PCP - General (Internal Medicine) Lei Pump, MD as PCP - Electrophysiology (Cardiology) Hugh Madura, MD as PCP - Cardiology (Cardiology) Knox Perl, MD as Attending Physician (Cardiology) Suan Elm, MD (Internal Medicine) Maudine Sos, MD as Attending Physician (Cardiology) Omega Bible, MD as Consulting Physician (Neurology) Nathen Balder, Skeeter Dukes, RN as [facility] Care Management  PLACE OF SERVICE:  [facility]  Advanced Directive information    Allergies  Allergen Reactions   Lexiscan  [Regadenoson ] Other (See Comments)    Perioral tingling and throat tightness    Chief Complaint  Patient presents with   Medical Management of Chronic Issues    4 weeks or for INR check. In addition needs to Cologuard, Shingrix and DTAP vaccine. Patient is on Coumadin  but she did not take it today.      Discussed the use of AI scribe software for clinical note transcription with the patient, who gave verbal consent to proceed.  History of Present Illness    [name] is a 73 year old female with atrial fibrillation and heart failure who presents for INR check   She is currently on multiple medications for her heart condition, including Toprol , Entresto , spironolactone , and Jardiance . Her current Coumadin  dose is 6 mg daily, and her INR is stable at 2.9. No issues with breathing, uses one pillow at night, and no swelling in her legs. She occasionally feels her heartbeat but does not experience chest pain.  She is on Keppra  for seizure management. No reports of blood in her stool or urine, and no difficulty swallowing, although her food is mashed for easier consumption. She uses a cane for mobility at home.  Recent lab work shows good cholesterol levels with an LDL of 68, and her sodium and potassium levels are within normal limits.    Review of Systems:  Review of Systems   Constitutional:  Negative for fever.  Eyes:  Negative for double vision.  Respiratory:  Negative for cough, sputum production and shortness of breath.   Cardiovascular:  Negative for chest pain, palpitations and leg swelling.  Gastrointestinal:  Negative for abdominal pain.  Genitourinary:  Negative for dysuria.  Musculoskeletal:  Negative for falls.  Neurological:  Negative for dizziness.   Negative unless indicated in HPI.   Past Medical History:  Diagnosis Date   Adenomatous polyp of colon 09/2012   repeat colonoscopy in 5 years by Dr. Adan Holms   CHF (congestive heart failure) (HCC)    EF 15-20% as of 05/28/11 (Dr Bambi Lever [facility] D/C summary)   CVA (cerebral infarction) 12/2007   H/O heart bypass surgery 2022   Hemiparesis (HCC)    Hyperlipidemia    Hypertension    Seizures (HCC)    Sickle cell anemia (HCC)    Stroke (HCC)    Vitamin D  deficiency 2010   Past Surgical History:  Procedure Laterality Date   ABDOMINAL HYSTERECTOMY     BIV PACEMAKER INSERTION CRT-P N/A 04/29/2020   Procedure: BIV PACEMAKER INSERTION CRT-P;  Surgeon: Lei Pump, MD;  Location: [facility] INVASIVE CV LAB;  Service: Cardiovascular;  Laterality: N/A;   CORONARY ARTERY BYPASS GRAFT N/A 04/25/2020   Procedure: CORONARY ARTERY BYPASS GRAFTING (CABG) TIMES TWO USING LEFT INTERNAL MAMMARY ARTERY AND RIGHT GREATER SAPHENOUS VEIN HARVESTED ENDOSCOPICALLY;  Surgeon: Zelphia Higashi, MD;  Location: [facility] OR;  Service: Open Heart Surgery;  Laterality: N/A;   FRACTURE SURGERY     HIP SURGERY  LEFT HEART CATH AND CORONARY ANGIOGRAPHY N/A 04/22/2020   Procedure: LEFT HEART CATH AND CORONARY ANGIOGRAPHY;  Surgeon: Darlis Eisenmenger, MD;  Location: [facility] INVASIVE CV LAB;  Service: Cardiovascular;  Laterality: N/A;   TEE WITHOUT CARDIOVERSION  04/25/2020   Procedure: TRANSESOPHAGEAL ECHOCARDIOGRAM (TEE);  Surgeon: Zelphia Higashi, MD;  Location: [facility] OR;  Service: Open Heart Surgery;;   TEMPORARY PACEMAKER  N/A 04/21/2020   Procedure: TEMPORARY PACEMAKER;  Surgeon: Darlis Eisenmenger, MD;  Location: [facility] INVASIVE CV LAB;  Service: Cardiovascular;  Laterality: N/A;   TUBAL LIGATION     Social History:   reports that she quit smoking about 15 years ago. Her smoking use included cigarettes. She has never used smokeless tobacco. She reports that she does not currently use alcohol. She reports that she does not use drugs.  Family History  Problem Relation Age of Onset   Diabetes Daughter    High Cholesterol Daughter    Hypertension Daughter    Heart disease Daughter    Hypertension Sister    High Cholesterol Sister    Asthma Brother    Bronchitis Brother    Colon cancer Maternal Grandfather     Medications: Patient's Medications  New Prescriptions   No medications on file  Previous Medications   ACETAMINOPHEN  (TYLENOL ) 325 MG TABLET    Take 650 mg by mouth 2 (two) times a week. As needed   ATORVASTATIN  (LIPITOR ) 80 MG TABLET    Take 1 tablet (80 mg total) by mouth at bedtime.   CHOLECALCIFEROL 50 MCG (2000 UT) TABS    Take 1 tablet by mouth daily in the afternoon.   ENSURE (ENSURE)    Take 237 mLs by mouth daily.   EZETIMIBE  (ZETIA ) 10 MG TABLET    Take 1 tablet (10 mg total) by mouth daily.   FUROSEMIDE  (LASIX ) 20 MG TABLET    Take 2 tablets (40 mg total) by mouth daily.   JARDIANCE  10 MG TABS TABLET    TAKE 1 TABLET(10 MG) BY MOUTH DAILY   LACOSAMIDE  (VIMPAT ) 100 MG TABS    Take 1 tablet by mouth every morning and 1 tablet every night at bedtime   LEVETIRACETAM  (KEPPRA ) 750 MG TABLET    Take 2 tablets (1,500 mg total) by mouth 2 (two) times daily.   METOPROLOL  SUCCINATE (TOPROL  XL) 25 MG 24 HR TABLET    Take 1 tablet (25 mg total) by mouth daily.   NYSTATIN  CREAM (MYCOSTATIN )    Apply 1 Application topically as needed for dry skin.   SACUBITRIL -VALSARTAN  (ENTRESTO ) 97-103 MG    Take 1 tablet by mouth 2 (two) times daily.   SPIRONOLACTONE  (ALDACTONE ) 25 MG TABLET    TAKE 1 TABLET(25 MG) BY  MOUTH DAILY   WARFARIN (COUMADIN ) 1 MG TABLET    TAKE 1 TABLET(1 MG) BY MOUTH DAILY   WARFARIN (COUMADIN ) 5 MG TABLET    Take 1 tablet (5 mg total) by mouth daily.  Modified Medications   No medications on file  Discontinued Medications   No medications on file    Physical Exam: Vitals:   05/21/23 0936  BP: 118/77  Pulse: 60  Resp: 18  Temp: (!) 96.6 F (35.9 C)  SpO2: 99%  Weight: 136 lb (61.7 kg)  Height: 5\' 7"  (1.702 m)   Body mass index is 21.3 kg/m. BP Readings from Last 3 Encounters:  05/21/23 118/77  05/16/23 120/64  04/10/23 118/75   Wt Readings from Last 3 Encounters:  05/21/23 136  lb (61.7 kg)  04/10/23 139 lb (63 kg)  01/25/23 139 lb 12.8 oz (63.4 kg)    Physical Exam Constitutional:      Appearance: Normal appearance.  HENT:     Head: Normocephalic and atraumatic.  Cardiovascular:     Rate and Rhythm: Normal rate and regular rhythm.     Heart sounds: Murmur heard.  Pulmonary:     Effort: Pulmonary effort is normal. No respiratory distress.     Breath sounds: Normal breath sounds. No wheezing.  Abdominal:     General: Bowel sounds are normal. There is no distension.     Tenderness: There is no abdominal tenderness. There is no guarding.     Comments:    Musculoskeletal:        General: No swelling.  Skin:    General: Skin is dry.  Neurological:     Mental Status: She is alert. Mental status is at baseline.     Motor: No weakness.     Comments: Residual rt sided weakness     Labs reviewed: Basic Metabolic Panel: Recent Labs    12/27/22 0931 01/25/23 1000 05/16/23 1009  NA 138 137 137  K 4.7 4.3 4.2  CL 107 105 104  CO2 23 21* 20*  GLUCOSE 79 95 77  BUN 28* 28* 25*  CREATININE 1.21* 1.18* 1.39*  CALCIUM  9.2 9.0 9.1   Liver Function Tests: Recent Labs    06/06/22 1009  AST 23  ALT 9  BILITOT 0.4  PROT 7.8   No results for input(s): "LIPASE", "AMYLASE" in the last 8760 hours. No results for input(s): "AMMONIA" in the last  8760 hours. CBC: Recent Labs    10/03/22 1010  WBC 5.9  NEUTROABS 3,463  HGB 12.5  HCT 38.6  MCV 88.1  PLT 304   Lipid Panel: Recent Labs    06/06/22 1009 05/16/23 1009  CHOL 141 125  HDL 46* 41  LDLCALC 76 68  TRIG 108 80  CHOLHDL 3.1 3.0   TSH: No results for input(s): "TSH" in the last 8760 hours. A1C: Lab Results  Component Value Date   HGBA1C 6.1 (H) 06/06/2022    Assessment and Plan Assessment & Plan   1. Anticoagulated on Coumadin  (Primary) INR 2.9  No signs of bleeding Cont with coumadin  Follow up in 5 weeks  2. History of CVA (cerebrovascular accident) Residual rt sided weakness Ldl at goal Cont with lipitor   3. Chronic combined systolic and diastolic heart failure (HCC) Euvolemic on exam today  Lungs clear  Cont with lipitor  , toprol , entresto , spironolactone , lasix , jardiance   4. Screening for colon cancer  - Cologuard

## 2023-06-03 NOTE — Progress Notes (Incomplete)
 ***In Progress***    Advanced Heart Failure Clinic Note   PCP: Tye Gall, MD HF Cardiology: Dr. Mitzie Anda   73 y.o. with history of CVA, chronic systolic CHF, CHB, and CAD returns for followup of CHF.    She had a stroke in 2009, thought to be cardioembolic.  She has had right hemiparesis and aphasia since that time.    Ms. [name] was hospitalized in August 2020 with a seizure after being out of her medications for 4 days.  She required intubation. High-sensitivity troponin trended to 1905. An echocardiogram 08/2018 revealed EF of 35 to 40% with global hypokinesis and moderate LVH, elevated LVEDP. She also had moderate AR. Nuclear stress test in August 2020 showed a large severe fixed inferior, anterior apical, and inferolateral perfusion defect suggestive of scar without ischemia. Study was high risk due to EF estimated at 34% but mentions no significant reversible ischemia. She was deemed not a good candidate for cardiac catheterization during that hospitalization.    Patient was admitted 03/2020 following a syncopal episode and was found to be in complete heart block.  This improved initially after stopping nodal blockers. Echo in 3/22 showed EF 35-40%. LHC showed significant left main and LCx disease. She was deemed to be a poor candidate for PCI. In 04/2020, she had CABG with LIMA-LAD and SVG-OM2.  Post-op, she developed CHB again.  St Jude CRT-P device was placed. She was discharged to a rehab facility.    Echo in 09/2020 showed EF 35% with basal to mid inferior and inferolateral AK, mildly decreased RV function, mild-moderate MR.     Echo 12/2021 EF 25-30%, RV mod reduced.  Echo was done today and reviewed, EF 20-25% with mild RV dysfunction and mild MR.    She was seen for follow-up in clinic on 05/16/23, her husband reported most of her history with her expressive aphasia. No symptoms reported. She was able to walk short distances with a cane without dyspnea. No orthopnea or  PND.  Today he returns to HF clinic for pharmacist medication titration. At last visit with MD metoprolol  tartrate was discontinued and metoprolol  succinate was started at 25 mg daily. ***   Shortness of breath/dyspnea on exertion? {YES NO:22349}  Orthopnea/PND? {YES NO:22349} Edema? {YES NO:22349} Lightheadedness/dizziness? {YES NO:22349} Daily weights at home? {YES NO:22349} Blood pressure/heart rate monitoring at home? {YES [id number] Following low-sodium/fluid-restricted diet? {YES NO:22349}  HF Medications: Metoprolol  succinate 25 mg daily ***50 mg tabs filled for 90-day supply most recently Entresto  97/103 mg BID Spironolactone  25 mg daily Jardiance  10 mg daily Furosemide  40 mg daily  Has the patient been experiencing any side effects to the medications prescribed?  {YES NO:22349}  Does the patient have any problems obtaining medications due to transportation or finances?  Insurance: [employer] (Medicare Advantage) ***  Understanding of regimen: {excellent/good/fair/poor:19665} Understanding of indications: {excellent/good/fair/poor:19665} Potential of compliance: {excellent/good/fair/poor:19665} Patient understands to avoid NSAIDs. Patient understands to avoid decongestants.    Pertinent Lab Values: 05/16/23: Serum creatinine 1.39 mg/dL, BUN 25 mg/dL, Potassium 4.2 mmol/L, Sodium 137 mmol/L, BNP 259.4 pg/mL  Vital Signs: Weight: *** (last clinic weight: 136 lbs) Blood pressure: *** (last BP: 118/77 mmHg) Heart rate: *** (last HR: 60 bpm)  Brief a/p -no labs -metoprolol  dose: pt instructed to start 25 dy, but filled for 50 dy; what dose is she taking? ***Pt has CRT-P*** HR low at senior care; if <60 and symptoms (SOB + fatigue) may need to red dose to 25/12.5 >>do not think pt will tolerate  inc metop 25 >50 3 mon f/u with MD scheduled  Asssessment/Plan: 1. Complete heart block: Patient had baseline NSR with LAFB/RBBB, so there was underlying conduction system disease.   She was transiently in CHB pre-CABG and developed persistent CHB post-CABG.  She now has [employer] CRT-P device.  99% BiV paced on device interrogation.  - followed by EP clinic 2. Chronic systolic CHF: Ischemic cardiomyopathy. Echo in 08/2018 with EF 35-40% and wall motion abnormalities. LHC in 04/2020 showed left main/severe codominant LCx disease. Echo in 03/2020 with EF 35-40%, inferior and lateral hypokinesis. Echo in 09/2020 with EF 35%, mild RV dysfunction.  Now s/p CABG. Echo 12/2021 EF 25-30%, RV moderately reduced. Echo 04/2023 with EF 20-25% with mild RV dysfunction and mild MR.  NYHA class II, confounded by mobility issues from CVA. She is not volume overloaded by exam or***. - Continue furosemide  40 mg daily. - ***Continue metoprolol  succinate 25 mg daily.  - Continue Entresto  97/103 BID.   - Continue spironolactone  25 mg daily.    - Continue Jardiance  10 mg daily. 3. 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 Has residual long-standing aphasia and right hemiparesis.   - Continue warfarin. INR followed by warfarin clinic. We looked into transitioning to Eliquis  but she cannot afford Eliquis .  4. H/o seizure disorder: Continue Keppra .  - Followed by neurology  5. CAD:  LHC showed 60-70% distal left main stenosis, heavily calcified proximal LCx with 60-70% stenosis at the ostium and discrete 99% stenosis in the proximal LCx.  S/p CABG x 2 04/2020 w/ LIMA-LAD, SVG-OM2.  No chest pain. - no ASA given warfarin - Continue statin.   Follow up with APP in 3 months   Albino Alu, PharmD PGY2 Cardiology Pharmacy Resident  Luster Salters, PharmD, BCPS, [facility], CPP Heart Failure Clinic Pharmacist [phone]

## 2023-06-06 ENCOUNTER — Ambulatory Visit (HOSPITAL_COMMUNITY)
Admission: RE | Admit: 2023-06-06 | Discharge: 2023-06-06 | Disposition: A | Discharge status: Home / Self Care | Source: Ambulatory Visit | Attending: Adult Health | Admitting: Adult Health

## 2023-06-06 VITALS — BP 116/72 | HR 65

## 2023-06-06 DIAGNOSIS — I2510 Atherosclerotic heart disease of native coronary artery without angina pectoris: Secondary | ICD-10-CM | POA: Diagnosis not present

## 2023-06-06 DIAGNOSIS — I5022 Chronic systolic (congestive) heart failure: Secondary | ICD-10-CM | POA: Diagnosis not present

## 2023-06-06 DIAGNOSIS — I4510 Unspecified right bundle-branch block: Secondary | ICD-10-CM | POA: Insufficient documentation

## 2023-06-06 DIAGNOSIS — I442 Atrioventricular block, complete: Secondary | ICD-10-CM | POA: Diagnosis not present

## 2023-06-06 DIAGNOSIS — Z79899 Other long term (current) drug therapy: Secondary | ICD-10-CM | POA: Diagnosis not present

## 2023-06-06 DIAGNOSIS — Z951 Presence of aortocoronary bypass graft: Secondary | ICD-10-CM | POA: Insufficient documentation

## 2023-06-06 DIAGNOSIS — I69320 Aphasia following cerebral infarction: Secondary | ICD-10-CM | POA: Diagnosis not present

## 2023-06-06 DIAGNOSIS — I255 Ischemic cardiomyopathy: Secondary | ICD-10-CM | POA: Insufficient documentation

## 2023-06-06 DIAGNOSIS — I69351 Hemiplegia and hemiparesis following cerebral infarction affecting right dominant side: Secondary | ICD-10-CM | POA: Insufficient documentation

## 2023-06-06 MED ORDER — METOPROLOL SUCCINATE ER 50 MG PO TB24
50.0000 mg | ORAL_TABLET | Freq: Every day | ORAL | 3 refills | Status: DC
Start: 2023-06-06 — End: 2023-09-10

## 2023-06-06 NOTE — Patient Instructions (Addendum)
 It was a pleasure seeing you today!  MEDICATIONS: -We are changing your medications today -Increase metoprolol  succinate to 50 mg daily. If you notice worsening fatigue, you can reduced back to 25 mg (0.5 tab) daily, but call us  to let us  know when doing so. -Call if you have questions about your medications.  NEXT APPOINTMENT: Return to clinic in 3 months with APP.  In general, to take care of your heart failure: -Limit your fluid intake to 2 Liters (half-gallon) per day.   -Limit your salt intake to ideally 2-3 grams (2000-3000 mg) per day. -Weigh yourself daily and record, and bring that "weight diary" to your next appointment.  (Weight gain of 2-3 pounds in 1 day typically means fluid weight.) -The medications for your heart are to help your heart and help you live longer.   -Please contact us  before stopping any of your heart medications.  Call the clinic at [phone] with questions or to reschedule future appointments.

## 2023-06-06 NOTE — Progress Notes (Signed)
 Advanced Heart Failure Clinic Note   PCP: Tye Gall, MD HF Cardiology: Dr. Mitzie Anda   73 y.o. with history of CVA, chronic systolic CHF, CHB, and CAD returns for followup of CHF.    She had a stroke in 2009, thought to be cardioembolic.  She has had right hemiparesis and aphasia since that time.    Ms. [name] was hospitalized in August 2020 with a seizure after being out of her medications for 4 days.  She required intubation. High-sensitivity troponin trended to 1905. An echocardiogram 08/2018 revealed EF of 35 to 40% with global hypokinesis and moderate LVH, elevated LVEDP. She also had moderate AR. Nuclear stress test in August 2020 showed a large severe fixed inferior, anterior apical, and inferolateral perfusion defect suggestive of scar without ischemia. Study was high risk due to EF estimated at 34% but mentions no significant reversible ischemia. She was deemed not a good candidate for cardiac catheterization during that hospitalization.    Patient was admitted 03/2020 following a syncopal episode and was found to be in complete heart block.  This improved initially after stopping nodal blockers. Echo in 03/2020 showed EF 35-40%. LHC showed significant left main and LCx disease. She was deemed to be a poor candidate for PCI. In 04/2020, she had CABG with LIMA-LAD and SVG-OM2.  Post-op, she developed CHB again.  St Jude CRT-P device was placed. She was discharged to a rehab facility.    Echo in 09/2020 showed EF 35% with basal to mid inferior and inferolateral AK, mildly decreased RV function, mild-moderate MR.     Echo 12/2021 EF 25-30%, RV mod reduced.    She was seen for follow-up in clinic on 05/16/23. Echo EF 20-25% with mild RV dysfunction and mild MR.  Her husband reported most of her history with her expressive aphasia. No symptoms reported. She was able to walk short distances with a cane without dyspnea. No orthopnea or PND.  Patient returns to heart failure clinic for  pharmacist-led medication titration. At her last visit with the cardiologist, metoprolol  tartrate was discontinued and metoprolol  succinate was initiated at 25 mg daily. Additionally, ezetimibe  10 mg daily was initiated as patient's LDL was above goal of <55. The patient and her husband report she is feeling well overall. She has not experienced any shortness of breath or signs of lower extremity edema. Her weight has remained stable at 136 lbs at home. She denies paroxysmal nocturnal dyspnea (PND) and orthopnea. She reports some fatigue, which she attributes to the increased frequency of medical appointments. Blood pressure today is stable at 116/72 mmHg. Her husband confirms good medication adherence, as he administers her medications each morning and evening. Patient most recently picked up an older prescription for metoprolol  succinate 50 mg tablets with instructions on the label to take 1 tablet daily; husband was aware of instructed dose from previous visit and was correctly giving the patient 25 mg (0.5 tab) daily.  HF Medications: Metoprolol  succinate 25 mg daily; of note, patient had picked up a prescription for 50 mg tablets but was still giving the wife 25 mg (0.5 tab) daily as instructed Entresto  97/103 mg BID Spironolactone  25 mg daily Jardiance  10 mg daily Furosemide  40 mg daily; patient is taking 20 mg BID  Has the patient been experiencing any side effects to the medications prescribed?  no  Does the patient have any problems obtaining medications due to transportation or finances?  Patient most recently picked up an older prescription for metoprolol  succinate  50 mg tablets with instructions on the label to take 1 tablet daily; husband was aware of instructed dose from previous visit and was giving the patient 25 mg (0.5 tab) daily. Insurance: [employer] [facility])  Understanding of regimen: excellent Understanding of indications: excellent Potential of compliance:  excellent Patient understands to avoid NSAIDs. Patient understands to avoid decongestants.    Pertinent Lab Values: 05/16/23: Serum creatinine 1.39 mg/dL, BUN 25 mg/dL, Potassium 4.2 mmol/L, Sodium 137 mmol/L, BNP 259.4 pg/mL  Vital Signs: Weight: unable to assess as patient is wheelchair bound (last clinic weight: 136 lbs) Blood pressure: 116/72 mmHg (last BP: 118/77 mmHg) Heart rate: 65 bpm (last HR: 60 bpm)  Asssessment/Plan: 1. Complete heart block: Patient had baseline NSR with LAFB/RBBB, so there was underlying conduction system disease.  She was transiently in CHB pre-CABG and developed persistent CHB post-CABG.  She now has [employer] teacher CRT-P device. - followed by EP clinic 2. Chronic systolic CHF: Ischemic cardiomyopathy. Echo in 08/2018 with EF 35-40% and wall motion abnormalities. LHC in 04/2020 showed left main/severe codominant LCx disease. Echo in 03/2020 with EF 35-40%, inferior and lateral hypokinesis. Echo in 09/2020 with EF 35%, mild RV dysfunction.  Now s/p CABG. Echo 12/2021 EF 25-30%, RV moderately reduced. Echo 04/2023 with EF 20-25% with mild RV dysfunction and mild MR.  NYHA class II, confounded by mobility issues from CVA. She is not volume overloaded by exam. - Continue furosemide  20 mg BID - Increase metoprolol  succinate to 50 mg daily.  - Continue Entresto  97/103 mg BID.   - Continue spironolactone  25 mg daily.    - Continue Jardiance  10 mg daily. 3. 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 Has residual long-standing aphasia and right hemiparesis.   - Continue warfarin. INR followed by warfarin clinic. Previously looked into transitioning to Eliquis  but she cannot afford Eliquis .  4. H/o seizure disorder: Continue Keppra .  - Followed by neurology  5. CAD:  LHC showed 60-70% distal left main stenosis, heavily calcified proximal LCx with 60-70% stenosis at the ostium and discrete 99% stenosis in the proximal LCx.  S/p CABG x 2 04/2020 w/ LIMA-LAD, SVG-OM2.  No chest pain. -  no ASA given warfarin - Continue atorvastatin  80 mg daily.   Follow up with APP in 3 months   Albino Alu, PharmD PGY2 Cardiology Pharmacy Resident  Luster Salters, PharmD, BCPS, [facility], CPP Heart Failure Clinic Pharmacist [phone]

## 2023-06-10 NOTE — Patient Instructions (Signed)
 Below is our plan:  We will continue levetiracetam  1500mg  and lacosamide  100mg  twice daily  [facility] patient assistance: [phone]   Please make sure you are consistent with timing of seizure medication. I recommend annual visit with primary care provider (PCP) for complete physical and routine blood work. I recommend daily intake of vitamin D  (400-800iu) and calcium  (800-1000mg ) for bone health. Discuss Dexa screening with PCP.   Please maintain precautions. Do not participate in activities where a loss of awareness could harm you or someone else. No swimming alone, no tub bathing, no hot tubs, no driving, no operating motorized vehicles (cars, ATVs, motocycles, etc), lawnmowers, power tools or firearms. No standing at heights, such as rooftops, ladders or stairs. Avoid hot objects such as stoves, heaters, open fires. Wear a helmet when riding a bicycle, scooter, skateboard, etc. and avoid areas of traffic. Set your water heater to 120 degrees or less.  SUDEP is the sudden, unexpected death of someone with epilepsy, who was otherwise healthy. In SUDEP cases, no other cause of death is found when an autopsy is done. Each year, more than 1 in 1,000 people with epilepsy die from SUDEP. This is the leading cause of death in people with uncontrolled seizures. Until further answers are available, the best way to prevent SUDEP is to lower your risk by controlling seizures. Research has found that people with all types of epilepsy that experience convulsive seizures can be at risk.  Please make sure you are staying well hydrated. I recommend 50-60 ounces daily. Well balanced diet and regular exercise encouraged. Consistent sleep schedule with 6-8 hours recommended.   Please continue follow up with care team as directed.   Follow up with me in 1 year   You may receive a survey regarding today's visit. I encourage you to leave honest feed back as I do use this information to improve patient care. Thank you  for seeing me today!

## 2023-06-10 NOTE — Progress Notes (Signed)
 Chief Complaint  Patient presents with   RM 1/Seizures    Pt is here with her Husband. Pt's husband states that pt has been stable since last appointment.     HISTORY OF PRESENT ILLNESS:  06/11/23 ALL:  [name] returns for follow up for seizures. She continues levetiracetam  1500mg  and lacosamide  100mg  BID. Seems to tolerate ASMs well. No seizure activity. No difficulty getting medications. Vimpat  continued through patient assistance. She is followed regularly by PCP. She continues coumadin  and atorvastatin . LDL 68 04/2023. Considering Eliquis  but too expensive at this time. She is able to walk around the home with minimal assistance. Uses wheelchair for any significant distances.   05/16/2022 ALL:  [name] returns for follow up for seizures. Husband provides history. She continues levetiracetam  1500mg  and lacosamide  100mg  BID. No obvious adverse effects. No seizure events. She continues close follow up with PCP. Labs have been stable. She continues atorvastatin  and warfarin. She continues to walk without assistance around the home but uses wheelchair outside home.   05/15/2021 ALL: [name] is a 73 y.o. female here today for follow up for seizures. She presents with her husband who provides history. She continues levetiracetam  1500mg  and lacosamide  100mg  twice daily. No seizure activity. She is tolerating meds without obvious adverse effects. She is able to perform ADLs with minimal assistance. She walks around home without assistance but uses wheelchair for any long distance walking. She has significant aphasia, receptive and expressive.   She continues close follow up with PCP and cardiology. She continues atorvastatin  80mg  daily. On warfarin as well.   HISTORY (copied from Dr Elon Hakim previous note)  UPDATE (12/30/19, VRP): Since last visit, doing well. No seizures. Tolerating meds.     PRIOR HPI: 73 year old female here for evaluation of seizure.  History of left  hemisphere stroke in 2009 with resultant right-sided hemiparesis.  Patient also had seizure disorder started around 2009.  Patient was doing well until August 2020 when patient had run out of antiseizure medications and had breakthrough seizures, brought to the hospital and treated.  Patient had multiple clinical and electrographic seizures in the hospital.  Patient was maintained on levetiracetam  and Vimpat  and dilantin ; then Dilantin  was tapered off at discharge.    Since then patient is stable.  Tolerating medications.    REVIEW OF SYSTEMS: Out of a complete 14 system review of symptoms, the patient complains only of the following symptoms, right sided weakness, difficulty with speech, and all other reviewed systems are negative.   ALLERGIES: Allergies  Allergen Reactions   Lexiscan  [Regadenoson ] Other (See Comments)    Perioral tingling and throat tightness     HOME MEDICATIONS: Outpatient Medications Prior to Visit  Medication Sig Dispense Refill   acetaminophen  (TYLENOL ) 325 MG tablet Take 650 mg by mouth 2 (two) times a week. As needed     atorvastatin  (LIPITOR ) 80 MG tablet Take 1 tablet (80 mg total) by mouth at bedtime. 90 tablet 1   Cholecalciferol 50 MCG (2000 UT) TABS Take 1 tablet by mouth daily in the afternoon.     Ensure (ENSURE) Take 237 mLs by mouth daily.     ezetimibe  (ZETIA ) 10 MG tablet Take 1 tablet (10 mg total) by mouth daily. 90 tablet 3   furosemide  (LASIX ) 20 MG tablet Take 2 tablets (40 mg total) by mouth daily. (Patient taking differently: Take 20 mg by mouth 2 (two) times daily.) 180 tablet 3   JARDIANCE  10 MG TABS tablet TAKE 1 TABLET(10  MG) BY MOUTH DAILY 30 tablet 11   metoprolol  succinate (TOPROL -XL) 50 MG 24 hr tablet Take 1 tablet (50 mg total) by mouth daily. Take with or immediately following a meal. 90 tablet 3   nystatin  cream (MYCOSTATIN ) Apply 1 Application topically as needed for dry skin.     sacubitril -valsartan  (ENTRESTO ) 97-103 MG Take 1  tablet by mouth 2 (two) times daily. 60 tablet 11   spironolactone  (ALDACTONE ) 25 MG tablet TAKE 1 TABLET(25 MG) BY MOUTH DAILY 30 tablet 11   warfarin (COUMADIN ) 1 MG tablet TAKE 1 TABLET(1 MG) BY MOUTH DAILY 30 tablet 3   warfarin (COUMADIN ) 5 MG tablet Take 1 tablet (5 mg total) by mouth daily. 30 tablet 0   Lacosamide  (VIMPAT ) 100 MG TABS Take 1 tablet by mouth every morning and 1 tablet every night at bedtime 60 tablet 5   levETIRAcetam  (KEPPRA ) 750 MG tablet Take 2 tablets (1,500 mg total) by mouth 2 (two) times daily. 360 tablet 3   No facility-administered medications prior to visit.     PAST MEDICAL HISTORY: Past Medical History:  Diagnosis Date   Adenomatous polyp of colon 09/2012   repeat colonoscopy in 5 years by Dr. Adan Holms   CHF (congestive heart failure) (HCC)    EF 15-20% as of 05/28/11 (Dr Bambi Lever Rigby-Hospital D/C summary)   CVA (cerebral infarction) 12/2007   H/O heart bypass surgery 2022   Hemiparesis (HCC)    Hyperlipidemia    Hypertension    Seizures (HCC)    Sickle cell anemia (HCC)    Stroke (HCC)    Vitamin D  deficiency 2010     PAST SURGICAL HISTORY: Past Surgical History:  Procedure Laterality Date   ABDOMINAL HYSTERECTOMY     BIV PACEMAKER INSERTION CRT-P N/A 04/29/2020   Procedure: BIV PACEMAKER INSERTION CRT-P;  Surgeon: Lei Pump, MD;  Location: [facility] INVASIVE CV LAB;  Service: Cardiovascular;  Laterality: N/A;   CORONARY ARTERY BYPASS GRAFT N/A 04/25/2020   Procedure: CORONARY ARTERY BYPASS GRAFTING (CABG) TIMES TWO USING LEFT INTERNAL MAMMARY ARTERY AND RIGHT GREATER SAPHENOUS VEIN HARVESTED ENDOSCOPICALLY;  Surgeon: Zelphia Higashi, MD;  [facility] OR;  Service: Open Heart Surgery;  Laterality: N/A;   FRACTURE SURGERY     HIP SURGERY     LEFT HEART CATH AND CORONARY ANGIOGRAPHY N/A 04/22/2020   Procedure: LEFT HEART CATH AND CORONARY ANGIOGRAPHY;  Surgeon: Darlis Eisenmenger, MD;  Location: [facility] INVASIVE CV LAB;  Service:  Cardiovascular;  Laterality: N/A;   TEE WITHOUT CARDIOVERSION  04/25/2020   Procedure: TRANSESOPHAGEAL ECHOCARDIOGRAM (TEE);  Surgeon: Zelphia Higashi, MD;  Location: [facility] OR;  Service: Open Heart Surgery;;   TEMPORARY PACEMAKER N/A 04/21/2020   Procedure: TEMPORARY PACEMAKER;  Surgeon: Darlis Eisenmenger, MD;  Location: [facility] INVASIVE CV LAB;  Service: Cardiovascular;  Laterality: N/A;   TUBAL LIGATION       FAMILY HISTORY: Family History  Problem Relation Age of Onset   Diabetes Daughter    High Cholesterol Daughter    Hypertension Daughter    Heart disease Daughter    Hypertension Sister    High Cholesterol Sister    Asthma Brother    Bronchitis Brother    Colon cancer Maternal Grandfather      SOCIAL HISTORY: Social History   Socioeconomic History   Marital status: Married    Spouse name: [name]   Number of children: 2   Years of education: 12   Highest education level: Not on file  Occupational  History    Employer: RETIRED  Tobacco Use   Smoking status: Former    Current packs/day: 0.00    Types: Cigarettes    Quit date: 09/26/2007    Years since quitting: 15.7   Smokeless tobacco: Never  Vaping Use   Vaping status: Never Used  Substance and Sexual Activity   Alcohol use: Not Currently    Comment: Former drinker, 15 years ago   Drug use: Never   Sexual activity: Not Currently  Other Topics Concern   Not on file  Social History Narrative   Tobacco use, amount per day now: None.   Past tobacco use, amount per day: Everyday.   How many years did you use tobacco: 30 years.   Alcohol use (drinks per week): Wine Daily   Diet:   Do you drink/eat things with caffeine: Yes   Marital status: Married                                  What year were you married?    Do you live in a house, apartment, assisted living, condo, trailer, etc.? House   Is it one or more stories? 1 story.   How many persons live in your home? 2   Do you have pets in your home?( please list) No    Highest Level of education completed? Associate   Current or past profession: Director    Do you exercise?    No                             Type and how often?   Do you have a living will?    Do you have a DNR form?                                   If not, do you want to discuss one?   Do you have signed POA/HPOA forms?                        If so, please bring to you appointment      Do you have any difficulty bathing or dressing yourself? Yes   Do you have any difficulty preparing food or eating? Yes   Do you have any difficulty managing your medications? Yes   Do you have any difficulty managing your finances? Yes   Do you have any difficulty affording your medications? Yes   Social Drivers of Corporate investment banker Strain: Not on file  Food Insecurity: No Food Insecurity (10/20/2021)   Hunger Vital Sign    Worried About Running Out of Food in the Last Year: Never true    Ran Out of Food in the Last Year: Never true  Transportation Needs: No Transportation Needs (10/20/2021)   PRAPARE - Administrator, Civil Service (Medical): No    Lack of Transportation (Non-Medical): No  Physical Activity: Not on file  Stress: Not on file  Social Connections: Not on file  Intimate Partner Violence: Not on file     PHYSICAL EXAM  Vitals:   06/11/23 1330  BP: (!) 99/53  Pulse: 66     There is no height or weight on file to calculate BMI.  Generalized: Well developed, in no acute distress  Cardiology: normal rate and rhythm, no murmur auscultated  Respiratory: clear to auscultation bilaterally    Neurological examination  Mentation: Alert, unable to assess orientation, severe expressive and receptive aphasia.  Cranial nerve II-XII: Pupils were equal round reactive to light. Extraocular movements were full, visual field were full on confrontational test. Facial sensation and strength were normal. Head turning and shoulder shrug  were normal and symmetric. Motor:  The motor testing reveals 5 over 5 strength of left upper and lower ext, right upper ext 1/5. Right lower 1+-2/5.  Sensory: Sensory testing is intact to soft touch on all 4 extremities. No evidence of extinction is noted.  Coordination: unable to test, does not follow commands  Gait and station: gait not assessed, in wheelchair.    DIAGNOSTIC DATA (LABS, IMAGING, TESTING) - I reviewed patient records, labs, notes, testing and imaging myself where available.  Lab Results  Component Value Date   WBC 5.9 10/03/2022   HGB 12.5 10/03/2022   HCT 38.6 10/03/2022   MCV 88.1 10/03/2022   PLT 304 10/03/2022      Component Value Date/Time   NA 137 05/16/2023 1009   NA 137 02/28/2021 1038   K 4.2 05/16/2023 1009   CL 104 05/16/2023 1009   CO2 20 (L) 05/16/2023 1009   GLUCOSE 77 05/16/2023 1009   BUN 25 (H) 05/16/2023 1009   BUN 20 02/28/2021 1038   CREATININE 1.39 (H) 05/16/2023 1009   CREATININE 1.25 (H) 06/06/2022 1009   CALCIUM  9.1 05/16/2023 1009   PROT 7.8 06/06/2022 1009   PROT 7.5 02/28/2021 1038   ALBUMIN  4.6 02/28/2021 1038   AST 23 06/06/2022 1009   ALT 9 06/06/2022 1009   ALKPHOS 123 (H) 02/28/2021 1038   BILITOT 0.4 06/06/2022 1009   BILITOT 0.3 02/28/2021 1038   GFRNONAA 40 (L) 05/16/2023 1009   GFRNONAA 75 03/26/2014 1105   GFRAA >90 05/12/2020 0000   GFRAA 87 03/26/2014 1105   Lab Results  Component Value Date   CHOL 125 05/16/2023   HDL 41 05/16/2023   LDLCALC 68 05/16/2023   TRIG 80 05/16/2023   CHOLHDL 3.0 05/16/2023   Lab Results  Component Value Date   HGBA1C 6.1 (H) 06/06/2022   No results found for: "VITAMINB12" Lab Results  Component Value Date   TSH 1.846 10/23/2013       03/28/2022   11:23 AM  MMSE - Mini Mental State Exam  Not completed: Unable to complete         No data to display           ASSESSMENT AND PLAN  73 y.o. year old female  has a past medical history of Adenomatous polyp of colon (09/2012), CHF (congestive heart  failure) (HCC), CVA (cerebral infarction) (12/2007), H/O heart bypass surgery (2022), Hemiparesis (HCC), Hyperlipidemia, Hypertension, Seizures (HCC), Sickle cell anemia (HCC), Stroke (HCC), and Vitamin D  deficiency (2010). here with    Seizure disorder (HCC)  Hemiplegia of dominant side as late effect following cerebrovascular disease (HCC)  Long term (current) use of anticoagulants  Weakness  [name] is tolerating medicaiton well with no seizure activity. We will continue levetiracetam  1500mg  and lacosamide  100mg  twice daily. She will continue close follow up with PCP and cardiology. Seizure and stroke prevention education provided to Mr Febo. Healthy lifestyle habits encouraged. She will return to see me in 1 year, sooner if needed. She verbalizes understanding and agreement with this plan.   No orders of the defined types  were placed in this encounter.    Meds ordered this encounter  Medications   levETIRAcetam  (KEPPRA ) 750 MG tablet    Sig: Take 2 tablets (1,500 mg total) by mouth 2 (two) times daily.    Dispense:  360 tablet    Refill:  3    Supervising Provider:   AHERN, ANTONIA B [[id number]]   Lacosamide  (VIMPAT ) 100 MG TABS    Sig: Take 1 tablet (100 mg total) by mouth 2 (two) times daily.    Dispense:  180 tablet    Refill:  1    AMALLEN Refill     Terrilyn Fick, MSN, FNP-C 06/11/2023, 1:48 PM  [name] [address] [place], [place] [address] [phone]

## 2023-06-11 ENCOUNTER — Ambulatory Visit (INDEPENDENT_AMBULATORY_CARE_PROVIDER_SITE_OTHER): Admitting: Family Medicine

## 2023-06-11 ENCOUNTER — Encounter: Payer: Self-pay | Admitting: Family Medicine

## 2023-06-11 VITALS — BP 99/53 | HR 66

## 2023-06-11 DIAGNOSIS — I69959 Hemiplegia and hemiparesis following unspecified cerebrovascular disease affecting unspecified side: Secondary | ICD-10-CM

## 2023-06-11 DIAGNOSIS — R531 Weakness: Secondary | ICD-10-CM | POA: Diagnosis not present

## 2023-06-11 DIAGNOSIS — G40909 Epilepsy, unspecified, not intractable, without status epilepticus: Secondary | ICD-10-CM | POA: Diagnosis not present

## 2023-06-11 DIAGNOSIS — Z7901 Long term (current) use of anticoagulants: Secondary | ICD-10-CM

## 2023-06-11 MED ORDER — LEVETIRACETAM 750 MG PO TABS: 1500.0000 mg | ORAL_TABLET | Freq: Two times a day (BID) | ORAL | 3 refills | Status: AC

## 2023-06-11 MED ORDER — LACOSAMIDE 100 MG PO TABS: 100.0000 mg | ORAL_TABLET | Freq: Two times a day (BID) | ORAL | 1 refills | Status: DC

## 2023-06-19 NOTE — Addendum Note (Signed)
 Addended by: Edra Govern D on: 06/19/2023 10:53 AM   Modules accepted: Orders

## 2023-06-19 NOTE — Progress Notes (Signed)
 Remote pacemaker transmission.

## 2023-06-20 NOTE — Patient Instructions (Signed)
 1.)  Visit your local pharmacy regarding tetanus shot.

## 2023-06-24 ENCOUNTER — Other Ambulatory Visit: Payer: Self-pay | Admitting: Sports Medicine

## 2023-06-24 DIAGNOSIS — Z5181 Encounter for therapeutic drug level monitoring: Secondary | ICD-10-CM

## 2023-06-24 DIAGNOSIS — Z8673 Personal history of transient ischemic attack (TIA), and cerebral infarction without residual deficits: Secondary | ICD-10-CM

## 2023-06-24 NOTE — Telephone Encounter (Signed)
 High Risk Warning Populated when attempting to refill, I will send to Provider for further review

## 2023-06-25 ENCOUNTER — Ambulatory Visit (INDEPENDENT_AMBULATORY_CARE_PROVIDER_SITE_OTHER): Admitting: Sports Medicine

## 2023-06-25 ENCOUNTER — Encounter: Payer: Self-pay | Admitting: Sports Medicine

## 2023-06-25 VITALS — BP 106/70 | HR 60 | Temp 96.7°F | Resp 16 | Ht 67.0 in | Wt 136.0 lb

## 2023-06-25 DIAGNOSIS — Z7901 Long term (current) use of anticoagulants: Secondary | ICD-10-CM

## 2023-06-25 DIAGNOSIS — I429 Cardiomyopathy, unspecified: Secondary | ICD-10-CM | POA: Diagnosis not present

## 2023-06-25 DIAGNOSIS — G40909 Epilepsy, unspecified, not intractable, without status epilepticus: Secondary | ICD-10-CM | POA: Diagnosis not present

## 2023-06-25 DIAGNOSIS — I69959 Hemiplegia and hemiparesis following unspecified cerebrovascular disease affecting unspecified side: Secondary | ICD-10-CM

## 2023-06-25 DIAGNOSIS — N1831 Chronic kidney disease, stage 3a: Secondary | ICD-10-CM | POA: Diagnosis not present

## 2023-06-25 DIAGNOSIS — I10 Essential (primary) hypertension: Secondary | ICD-10-CM

## 2023-06-25 LAB — POCT INR: POC INR: 2.9

## 2023-06-25 NOTE — Progress Notes (Signed)
 Careteam: Patient Care Team: Tye Gall, MD as PCP - General (Internal Medicine) Lei Pump, MD as PCP - Electrophysiology (Cardiology) Hugh Madura, MD as PCP - Cardiology (Cardiology) Knox Perl, MD as Attending Physician (Cardiology) Suan Elm, MD (Internal Medicine) Maudine Sos, MD as Attending Physician (Cardiology) Salli Crawley, Brenton Cambridge, MD as Consulting Physician (Neurology) Nathen Balder, Skeeter Dukes, RN as [facility] Care Management  PLACE OF SERVICE:  [facility] CL[facility]  Advanced Directive information    Allergies  Allergen Reactions   Lexiscan  [Regadenoson ] Other (See Comments)    Perioral tingling and throat tightness    Chief Complaint  Patient presents with   Anticoagulation    5 week INR check        History of Present Illness  [name] is a 73 year old female with atrial fibrillation and heart failure who presents for INR check  INR 2.9  Denies signs of bleeding Pt has h/o CVA, has residual weakness on Rt side with aphasia Recently was evaluated by neurology  Pt ambulates with a walker at home Husband is the primary caretaker  No concerns today  H/o Seizure disorder On keppra , vimpat    CKD Lab Results  Component Value Date   CREATININE 1.39 (H) 05/16/2023   CREATININE 1.18 (H) 01/25/2023   CREATININE 1.21 (H) 12/27/2022         Review of Systems:  Review of Systems  Constitutional:  Negative for fever.  Respiratory:  Negative for cough.   Cardiovascular:  Negative for chest pain, palpitations and leg swelling.  Gastrointestinal:  Negative for abdominal pain, heartburn and nausea.  Genitourinary:  Negative for dysuria.  Neurological:  Negative for dizziness.   Negative unless indicated in HPI.   Past Medical History:  Diagnosis Date   Adenomatous polyp of colon 09/2012   repeat colonoscopy in 5 years by Dr. Adan Holms   CHF (congestive heart failure) (HCC)    EF 15-20% as of 05/28/11 (Dr  Bambi Lever Rigby-Hospital D/C summary)   CVA (cerebral infarction) 12/2007   H/O heart bypass surgery 2022   Hemiparesis (HCC)    Hyperlipidemia    Hypertension    Seizures (HCC)    Sickle cell anemia (HCC)    Stroke (HCC)    Vitamin D  deficiency 2010   Past Surgical History:  Procedure Laterality Date   ABDOMINAL HYSTERECTOMY     BIV PACEMAKER INSERTION CRT-P N/A 04/29/2020   Procedure: BIV PACEMAKER INSERTION CRT-P;  Surgeon: Lei Pump, MD;  Location: [facility] INVASIVE CV LAB;  Service: Cardiovascular;  Laterality: N/A;   CORONARY ARTERY BYPASS GRAFT N/A 04/25/2020   Procedure: CORONARY ARTERY BYPASS GRAFTING (CABG) TIMES TWO USING LEFT INTERNAL MAMMARY ARTERY AND RIGHT GREATER SAPHENOUS VEIN HARVESTED ENDOSCOPICALLY;  Surgeon: Zelphia Higashi, MD;  Location: [facility] OR;  Service: Open Heart Surgery;  Laterality: N/A;   FRACTURE SURGERY     HIP SURGERY     LEFT HEART CATH AND CORONARY ANGIOGRAPHY N/A 04/22/2020   Procedure: LEFT HEART CATH AND CORONARY ANGIOGRAPHY;  Surgeon: Darlis Eisenmenger, MD;  Location: [facility] INVASIVE CV LAB;  Service: Cardiovascular;  Laterality: N/A;   TEE WITHOUT CARDIOVERSION  04/25/2020   Procedure: TRANSESOPHAGEAL ECHOCARDIOGRAM (TEE);  Surgeon: Zelphia Higashi, MD;  Location: [facility] OR;  Service: Open Heart Surgery;;   TEMPORARY PACEMAKER N/A 04/21/2020   Procedure: TEMPORARY PACEMAKER;  Surgeon: Darlis Eisenmenger, MD;  Location: [facility] INVASIVE CV LAB;  Service: Cardiovascular;  Laterality: N/A;   TUBAL LIGATION  Social History:   reports that she quit smoking about 15 years ago. Her smoking use included cigarettes. She has never used smokeless tobacco. She reports that she does not currently use alcohol. She reports that she does not use drugs.  Family History  Problem Relation Age of Onset   Diabetes Daughter    High Cholesterol Daughter    Hypertension Daughter    Heart disease Daughter    Hypertension Sister    High Cholesterol Sister    Asthma Brother     Bronchitis Brother    Colon cancer Maternal Grandfather     Medications: Patient's Medications  New Prescriptions   No medications on file  Previous Medications   ACETAMINOPHEN  (TYLENOL ) 325 MG TABLET    Take 650 mg by mouth 2 (two) times a week. As needed   ATORVASTATIN  (LIPITOR ) 80 MG TABLET    Take 1 tablet (80 mg total) by mouth at bedtime.   CHOLECALCIFEROL 50 MCG (2000 UT) TABS    Take 1 tablet by mouth daily in the afternoon.   ENSURE (ENSURE)    Take 237 mLs by mouth daily.   EZETIMIBE  (ZETIA ) 10 MG TABLET    Take 1 tablet (10 mg total) by mouth daily.   FUROSEMIDE  (LASIX ) 20 MG TABLET    Take 2 tablets (40 mg total) by mouth daily.   JARDIANCE  10 MG TABS TABLET    TAKE 1 TABLET(10 MG) BY MOUTH DAILY   LACOSAMIDE  (VIMPAT ) 100 MG TABS    Take 1 tablet (100 mg total) by mouth 2 (two) times daily.   LEVETIRACETAM  (KEPPRA ) 750 MG TABLET    Take 2 tablets (1,500 mg total) by mouth 2 (two) times daily.   METOPROLOL  SUCCINATE (TOPROL -XL) 50 MG 24 HR TABLET    Take 1 tablet (50 mg total) by mouth daily. Take with or immediately following a meal.   NYSTATIN  CREAM (MYCOSTATIN )    Apply 1 Application topically as needed for dry skin.   SACUBITRIL -VALSARTAN  (ENTRESTO ) 97-103 MG    Take 1 tablet by mouth 2 (two) times daily.   SPIRONOLACTONE  (ALDACTONE ) 25 MG TABLET    TAKE 1 TABLET(25 MG) BY MOUTH DAILY   WARFARIN (COUMADIN ) 1 MG TABLET    TAKE 1 TABLET(1 MG) BY MOUTH DAILY   WARFARIN (COUMADIN ) 5 MG TABLET    Take 1 tablet (5 mg total) by mouth daily.  Modified Medications   No medications on file  Discontinued Medications   No medications on file    Physical Exam: Vitals:   06/20/23 1612  BP: 106/70  Pulse: 60  Resp: 16  Temp: (!) 96.7 F (35.9 C)  SpO2: 94%  Weight: 136 lb (61.7 kg)  Height: 5\' 7"  (1.702 m)   Body mass index is 21.3 kg/m. BP Readings from Last 3 Encounters:  06/20/23 106/70  06/11/23 (!) 99/53  06/06/23 116/72   Wt Readings from Last 3 Encounters:   06/20/23 136 lb (61.7 kg)  05/21/23 136 lb (61.7 kg)  04/10/23 139 lb (63 kg)    Physical Exam Constitutional:      Appearance: Normal appearance.  HENT:     Head: Normocephalic and atraumatic.  Cardiovascular:     Rate and Rhythm: Normal rate and regular rhythm.  Pulmonary:     Effort: Pulmonary effort is normal. No respiratory distress.     Breath sounds: Normal breath sounds. No wheezing.  Abdominal:     General: Bowel sounds are normal. There is no distension.  Tenderness: There is no abdominal tenderness. There is no guarding or rebound.     Comments:    Musculoskeletal:        General: No swelling.  Neurological:     Mental Status: She is alert. Mental status is at baseline.     Motor: No weakness.     Comments: Residual weakness on Rt upper extremity     Labs reviewed: Basic Metabolic Panel: Recent Labs    12/27/22 0931 01/25/23 1000 05/16/23 1009  NA 138 137 137  K 4.7 4.3 4.2  CL 107 105 104  CO2 23 21* 20*  GLUCOSE 79 95 77  BUN 28* 28* 25*  CREATININE 1.21* 1.18* 1.39*  CALCIUM  9.2 9.0 9.1   Liver Function Tests: No results for input(s): "AST", "ALT", "ALKPHOS", "BILITOT", "PROT", "ALBUMIN " in the last 8760 hours. No results for input(s): "LIPASE", "AMYLASE" in the last 8760 hours. No results for input(s): "AMMONIA" in the last 8760 hours. CBC: Recent Labs    10/03/22 1010  WBC 5.9  NEUTROABS 3,463  HGB 12.5  HCT 38.6  MCV 88.1  PLT 304   Lipid Panel: Recent Labs    05/16/23 1009  CHOL 125  HDL 41  LDLCALC 68  TRIG 80  CHOLHDL 3.0   TSH: No results for input(s): "TSH" in the last 8760 hours. A1C: Lab Results  Component Value Date   HGBA1C 6.1 (H) 06/06/2022    Assessment and Plan Assessment & Plan  1. Anticoagulated on Coumadin  (Primary)  INR at goal Cont with coumadin   Refilled yesterday   2. Secondary cardiomyopathy (HCC)  Follow up with cardiology  3. Primary hypertension  At goal  Cont with metoprolol   4.  Seizure disorder (HCC)  Cont with keppra , vimpat   Pt recently saw neurology  5. Hemiplegia of dominant side as late effect following cerebrovascular disease (HCC)  Cont with lipitor , zetia   6. Stage 3a chronic kidney disease (HCC) Avoid nephrotoxic meds Increase oral hydration

## 2023-07-16 ENCOUNTER — Ambulatory Visit (HOSPITAL_COMMUNITY)
Admission: RE | Admit: 2023-07-16 | Discharge: 2023-07-16 | Disposition: A | Discharge status: Home / Self Care | Source: Ambulatory Visit | Attending: Internal Medicine | Admitting: Internal Medicine

## 2023-07-16 ENCOUNTER — Ambulatory Visit (HOSPITAL_COMMUNITY): Payer: Self-pay | Admitting: Cardiology

## 2023-07-16 DIAGNOSIS — E782 Mixed hyperlipidemia: Secondary | ICD-10-CM | POA: Diagnosis not present

## 2023-07-16 LAB — LIPID PANEL
Cholesterol: 104 mg/dL (ref 0–200)
HDL: 40 mg/dL — ABNORMAL LOW (ref >40)
LDL Cholesterol: 51 mg/dL (ref 0–99)
Total CHOL/HDL Ratio: 2.6 ratio
Triglycerides: 65 mg/dL (ref <150)
VLDL: 13 mg/dL (ref 0–40)

## 2023-07-23 ENCOUNTER — Encounter: Payer: Self-pay | Admitting: Sports Medicine

## 2023-07-23 ENCOUNTER — Ambulatory Visit: Admitting: Sports Medicine

## 2023-07-23 ENCOUNTER — Other Ambulatory Visit: Payer: Self-pay | Admitting: Sports Medicine

## 2023-07-23 VITALS — BP 104/50 | HR 64 | Temp 96.8°F | Resp 10 | Ht 67.0 in

## 2023-07-23 DIAGNOSIS — Z7901 Long term (current) use of anticoagulants: Secondary | ICD-10-CM | POA: Diagnosis not present

## 2023-07-23 DIAGNOSIS — Z8673 Personal history of transient ischemic attack (TIA), and cerebral infarction without residual deficits: Secondary | ICD-10-CM | POA: Diagnosis not present

## 2023-07-23 DIAGNOSIS — I69959 Hemiplegia and hemiparesis following unspecified cerebrovascular disease affecting unspecified side: Secondary | ICD-10-CM

## 2023-07-23 LAB — POCT INR: POC INR: 3.8

## 2023-07-23 MED ORDER — NYSTATIN 100000 UNIT/GM EX CREA: 1.0000 | TOPICAL_CREAM | CUTANEOUS | 1 refills | Status: AC | PRN

## 2023-07-23 NOTE — Progress Notes (Addendum)
 [facility] CHAT ....   [facility] 7/7 ? 1:46PM Canceled: Order for Hydraulic / Hoyer Patient Lift + 1 other item was canceled. Please contact the supplier if you wish to have this order fulfilled   [name] Patient 7/3 ? 12:27AM Delivery address confirmed  I have called patient phone and unable to leave voicemail because voicemail box hasn't been set up yet. I will make another attempt to call patient and notify them of their order status.     [facility] 7/1 ? 2:22PM Held for Copay: Order for Hydraulic / Hoyer Patient Lift + 1 other item cannot be delivered - patient copay is needed  [name] [facility] 7/1 ? 10:53AM We were unable to reach the patient for payment.  Delivery will occur after payment can be collected.  Patient or family member can call [phone] to provide us  with information needed. Thank you.  [name] [facility] 7/1 ? 10:35AM accepted order successfully. #[id number]  [name] [facility] 7/1 ? 10:33AM Thank you for choosing [facility]. We have received your order and will begin to review. We will communicate through the activity feed within the [employer] order to let you know if anything else is required prior to being able to accept and move forward with your order. Again, thank you for choosing [facility].  Robbin Escher (You)  [employer] (2) just now  submitted to [facility] PRASHANTHI VELUDANDI [employer])  just now  approved order Jaylon Boylen (You)  [employer] (2) 20m ago created order

## 2023-07-23 NOTE — Telephone Encounter (Signed)
 Patient has request refill on medication Warfarin. Patient has appointment today 07/23/2023 for INR check. Medication pend and sent to PCP Sherlynn Madden, MD for approval based on INR level.

## 2023-07-23 NOTE — Progress Notes (Addendum)
 Careteam: Patient Care Team: Sherlynn Madden, MD as PCP - General (Internal Medicine) Inocencio Soyla Lunger, MD as PCP - Electrophysiology (Cardiology) Jeffrie Oneil BROCKS, MD as PCP - Cardiology (Cardiology) Ladona Heinz, MD as Attending Physician (Cardiology) Shepard Ade, MD (Internal Medicine) Raford Riggs, MD as Attending Physician (Cardiology) [name], Eduard SAUNDERS, MD as Consulting Physician (Neurology) Morgan, [name] CROME, RN as [facility] Care Management  PLACE OF SERVICE:  [facility]  Advanced Directive information    Allergies  Allergen Reactions   Lexiscan  [Regadenoson ] Other (See Comments)    Perioral tingling and throat tightness    Chief Complaint  Patient presents with   Medical Management of Chronic Issues    4 week follow up  Patient wants liptor rx // pt hasn't sent colonoscopy test back yet.      Discussed the use of AI scribe software for clinical note transcription with the patient, who gave verbal consent to proceed.  History of Present Illness    [name] is a 73 year old female with a history of stroke who presents with elevated INR levels. She is accompanied by her caregiver.  Her INR level is currently 3.8 while on Coumadin , taking 6 mg at night. There have been no recent changes in her diet, particularly in the intake of greens. No bleeding from gums, blood in stool, or urine. No recent history of fever, breathing problems, or joint pain. She has not felt dizzy or lightheaded, although she was noted to be tired after outings.  She has a history of stroke, resulting in some physical limitations. She requires assistance with bathing and showering and uses a walker to ambulate at home. She sometimes struggles to get up from a recliner due to weakness, particularly in her left arm, and occasionally needs help from her caregiver. No rash is present, but she requires more satin cream as she has run out.  Review of Systems:   Review of Systems  Constitutional:  Negative for chills and fever.  HENT:  Negative for congestion and sore throat.   Respiratory:  Negative for cough, sputum production and shortness of breath.   Cardiovascular:  Negative for chest pain, palpitations and leg swelling.  Gastrointestinal:  Negative for abdominal pain, heartburn and nausea.  Genitourinary:  Negative for dysuria, frequency and hematuria.  Musculoskeletal:  Negative for falls.  Neurological:  Negative for dizziness.   Negative unless indicated in HPI.   Past Medical History:  Diagnosis Date   Adenomatous polyp of colon 09/2012   repeat colonoscopy in 5 years by Dr. Jakie   CHF (congestive heart failure) (HCC)    EF 15-20% as of 05/28/11 (Dr Ozell Rigby-Hospital D/C summary)   CVA (cerebral infarction) 12/2007   H/O heart bypass surgery 2022   Hemiparesis (HCC)    Hyperlipidemia    Hypertension    Seizures (HCC)    Sickle cell anemia (HCC)    Stroke (HCC)    Vitamin D  deficiency 2010   Past Surgical History:  Procedure Laterality Date   ABDOMINAL HYSTERECTOMY     BIV PACEMAKER INSERTION CRT-P N/A 04/29/2020   Procedure: BIV PACEMAKER INSERTION CRT-P;  Surgeon: Inocencio Soyla Lunger, MD;  Location: [facility] INVASIVE CV LAB;  Service: Cardiovascular;  Laterality: N/A;   CORONARY ARTERY BYPASS GRAFT N/A 04/25/2020   Procedure: CORONARY ARTERY BYPASS GRAFTING (CABG) TIMES TWO USING LEFT INTERNAL MAMMARY ARTERY AND RIGHT GREATER SAPHENOUS VEIN HARVESTED ENDOSCOPICALLY;  Surgeon: Kerrin Elspeth BROCKS, MD;  Location: [facility] OR;  Service: Open Heart Surgery;  Laterality: N/A;   FRACTURE SURGERY     HIP SURGERY     LEFT HEART CATH AND CORONARY ANGIOGRAPHY N/A 04/22/2020   Procedure: LEFT HEART CATH AND CORONARY ANGIOGRAPHY;  Surgeon: Rolan Ezra RAMAN, MD;  Location: [facility] INVASIVE CV LAB;  Service: Cardiovascular;  Laterality: N/A;   TEE WITHOUT CARDIOVERSION  04/25/2020   Procedure: TRANSESOPHAGEAL ECHOCARDIOGRAM (TEE);  Surgeon:  Kerrin Elspeth BROCKS, MD;  Location: [facility];  Service: Open Heart Surgery;;   TEMPORARY PACEMAKER N/A 04/21/2020   Procedure: TEMPORARY PACEMAKER;  Surgeon: Rolan Ezra RAMAN, MD;  Location: [facility] INVASIVE CV LAB;  Service: Cardiovascular;  Laterality: N/A;   TUBAL LIGATION     Social History:   reports that she quit smoking about 15 years ago. Her smoking use included cigarettes. She has never used smokeless tobacco. She reports that she does not currently use alcohol. She reports that she does not use drugs.  Family History  Problem Relation Age of Onset   Diabetes Daughter    High Cholesterol Daughter    Hypertension Daughter    Heart disease Daughter    Hypertension Sister    High Cholesterol Sister    Asthma Brother    Bronchitis Brother    Colon cancer Maternal Grandfather     Medications: Patient's Medications  New Prescriptions   No medications on file  Previous Medications   ACETAMINOPHEN  (TYLENOL ) 325 MG TABLET    Take 650 mg by mouth 2 (two) times a week. As needed   ATORVASTATIN  (LIPITOR ) 80 MG TABLET    Take 1 tablet (80 mg total) by mouth at bedtime.   CHOLECALCIFEROL 50 MCG (2000 UT) TABS    Take 1 tablet by mouth daily in the afternoon.   ENSURE (ENSURE)    Take 237 mLs by mouth daily.   EZETIMIBE  (ZETIA ) 10 MG TABLET    Take 1 tablet (10 mg total) by mouth daily.   FUROSEMIDE  (LASIX ) 20 MG TABLET    Take 2 tablets (40 mg total) by mouth daily.   JARDIANCE  10 MG TABS TABLET    TAKE 1 TABLET(10 MG) BY MOUTH DAILY   LACOSAMIDE  (VIMPAT ) 100 MG TABS    Take 1 tablet (100 mg total) by mouth 2 (two) times daily.   LEVETIRACETAM  (KEPPRA ) 750 MG TABLET    Take 2 tablets (1,500 mg total) by mouth 2 (two) times daily.   METOPROLOL  SUCCINATE (TOPROL -XL) 50 MG 24 HR TABLET    Take 1 tablet (50 mg total) by mouth daily. Take with or immediately following a meal.   NYSTATIN  CREAM (MYCOSTATIN )    Apply 1 Application topically as needed for dry skin.   SACUBITRIL -VALSARTAN  (ENTRESTO )  97-103 MG    Take 1 tablet by mouth 2 (two) times daily.   SPIRONOLACTONE  (ALDACTONE ) 25 MG TABLET    TAKE 1 TABLET(25 MG) BY MOUTH DAILY   WARFARIN (COUMADIN ) 1 MG TABLET    TAKE 1 TABLET(1 MG) BY MOUTH DAILY   WARFARIN (COUMADIN ) 5 MG TABLET    Take 1 tablet (5 mg total) by mouth daily.  Modified Medications   No medications on file  Discontinued Medications   No medications on file    Physical Exam: Vitals:   07/22/23 1611  BP: (!) 104/50  Pulse: 64  Resp: 10  Temp: (!) 96.8 F (36 C)  SpO2: 95%  Height: 5' 7 (1.702 m)   Body mass index is 21.3 kg/m. BP Readings from Last 3 Encounters:  07/22/23 (!) 104/50  06/20/23 106/70  06/11/23 (!) 99/53   Wt Readings from Last 3 Encounters:  06/20/23 136 lb (61.7 kg)  05/21/23 136 lb (61.7 kg)  04/10/23 139 lb (63 kg)    Physical Exam Constitutional:      Appearance: Normal appearance.  HENT:     Head: Normocephalic and atraumatic.   Cardiovascular:     Rate and Rhythm: Normal rate and regular rhythm.  Pulmonary:     Effort: Pulmonary effort is normal. No respiratory distress.     Breath sounds: Normal breath sounds. No wheezing.  Abdominal:     General: Bowel sounds are normal. There is no distension.     Tenderness: There is no abdominal tenderness. There is no guarding or rebound.     Comments:     Musculoskeletal:        General: No swelling.   Neurological:     Mental Status: She is alert. Mental status is at baseline.     Motor: No weakness.     Comments: Residual weakness on Rt side     Labs reviewed: Basic Metabolic Panel: Recent Labs    12/27/22 0931 01/25/23 1000 05/16/23 1009  NA 138 137 137  K 4.7 4.3 4.2  CL 107 105 104  CO2 23 21* 20*  GLUCOSE 79 95 77  BUN 28* 28* 25*  CREATININE 1.21* 1.18* 1.39*  CALCIUM  9.2 9.0 9.1   Liver Function Tests: No results for input(s): AST, ALT, ALKPHOS, BILITOT, PROT, ALBUMIN  in the last 8760 hours. No results for input(s): LIPASE,  AMYLASE in the last 8760 hours. No results for input(s): AMMONIA in the last 8760 hours. CBC: Recent Labs    10/03/22 1010  WBC 5.9  NEUTROABS 3,463  HGB 12.5  HCT 38.6  MCV 88.1  PLT 304   Lipid Panel: Recent Labs    05/16/23 1009 07/16/23 1004  CHOL 125 104  HDL 41 40*  LDLCALC 68 51  TRIG 80 65  CHOLHDL 3.0 2.6   TSH: No results for input(s): TSH in the last 8760 hours. A1C: Lab Results  Component Value Date   HGBA1C 6.1 (H) 06/06/2022    Assessment and Plan Assessment & Plan  1. Anticoagulated on Coumadin  (Primary)  INR elevated at 3.8, above therapeutic range. No bleeding signs or dietary changes reported. - Hold Coumadin  today and tomorrow. - Resume 6 mg Coumadin  on Thursday. - Repeat INR next week. - Provide written Coumadin  dosing instructions. - Educated on consistent dietary intake of greens. Monitor for signs of bleeding       History of CVA (cerebrovascular accident)   - For home use only DME Other see comment  Other orders - nystatin  cream (MYCOSTATIN ); Apply 1 Application topically as needed for dry skin.  Dispense: 30 g; Refill: 1

## 2023-07-23 NOTE — Patient Instructions (Signed)
 Hold coumadin  dose today and tomorrow and resume with  6 mg  Thursday, until you see me next week

## 2023-07-25 ENCOUNTER — Other Ambulatory Visit: Payer: Self-pay | Admitting: Sports Medicine

## 2023-07-29 ENCOUNTER — Ambulatory Visit (INDEPENDENT_AMBULATORY_CARE_PROVIDER_SITE_OTHER): Payer: Medicare HMO

## 2023-07-29 DIAGNOSIS — I5022 Chronic systolic (congestive) heart failure: Secondary | ICD-10-CM

## 2023-07-31 LAB — CUP PACEART REMOTE DEVICE CHECK
Battery Remaining Longevity: 52 mo
Battery Remaining Percentage: 54 %
Battery Voltage: 2.98 V
Brady Statistic AP VP Percent: 10 %
Brady Statistic AP VS Percent: 1 %
Brady Statistic AS VP Percent: 89 %
Brady Statistic AS VS Percent: 1 %
Brady Statistic RA Percent Paced: 9.6 %
Date Time Interrogation Session: 20250707020011
Implantable Lead Connection Status: 753985
Implantable Lead Connection Status: 753985
Implantable Lead Connection Status: 753985
Implantable Lead Implant Date: 20220411
Implantable Lead Implant Date: 20220411
Implantable Lead Implant Date: 20220411
Implantable Lead Location: 753858
Implantable Lead Location: 753859
Implantable Lead Location: 753860
Implantable Pulse Generator Implant Date: 20220411
Lead Channel Impedance Value: 390 Ohm
Lead Channel Impedance Value: 430 Ohm
Lead Channel Impedance Value: 580 Ohm
Lead Channel Pacing Threshold Amplitude: 0.625 V
Lead Channel Pacing Threshold Amplitude: 0.75 V
Lead Channel Pacing Threshold Amplitude: 0.75 V
Lead Channel Pacing Threshold Pulse Width: 0.5 ms
Lead Channel Pacing Threshold Pulse Width: 0.5 ms
Lead Channel Pacing Threshold Pulse Width: 0.5 ms
Lead Channel Sensing Intrinsic Amplitude: 3.5 mV
Lead Channel Sensing Intrinsic Amplitude: 9.6 mV
Lead Channel Setting Pacing Amplitude: 2 V
Lead Channel Setting Pacing Amplitude: 2 V
Lead Channel Setting Pacing Amplitude: 2 V
Lead Channel Setting Pacing Pulse Width: 0.5 ms
Lead Channel Setting Pacing Pulse Width: 0.5 ms
Lead Channel Setting Sensing Sensitivity: 4 mV
Pulse Gen Model: 3562
Pulse Gen Serial Number: 6262141

## 2023-08-01 ENCOUNTER — Other Ambulatory Visit: Payer: Self-pay | Admitting: Diagnostic Neuroimaging

## 2023-08-02 ENCOUNTER — Encounter: Payer: Self-pay | Admitting: Adult Health

## 2023-08-02 ENCOUNTER — Ambulatory Visit: Admitting: Adult Health

## 2023-08-02 ENCOUNTER — Telehealth: Payer: Self-pay | Admitting: Diagnostic Neuroimaging

## 2023-08-02 VITALS — BP 122/75 | HR 69 | Temp 96.6°F | Resp 18 | Ht 67.0 in | Wt 139.0 lb

## 2023-08-02 DIAGNOSIS — G40909 Epilepsy, unspecified, not intractable, without status epilepticus: Secondary | ICD-10-CM

## 2023-08-02 DIAGNOSIS — E782 Mixed hyperlipidemia: Secondary | ICD-10-CM | POA: Diagnosis not present

## 2023-08-02 DIAGNOSIS — I5042 Chronic combined systolic (congestive) and diastolic (congestive) heart failure: Secondary | ICD-10-CM

## 2023-08-02 DIAGNOSIS — I69959 Hemiplegia and hemiparesis following unspecified cerebrovascular disease affecting unspecified side: Secondary | ICD-10-CM | POA: Diagnosis not present

## 2023-08-02 DIAGNOSIS — Z7901 Long term (current) use of anticoagulants: Secondary | ICD-10-CM

## 2023-08-02 LAB — POCT INR: POC INR: 2.9

## 2023-08-02 NOTE — Progress Notes (Unsigned)
 [facility]  Provider:  Jereld Serum DNP  Code Status:  Full Code  Goals of Care:     04/01/2023   11:12 AM  Advanced Directives  Does Patient Have a Medical Advance Directive? No  Would patient like information on creating a medical advance directive? No - Patient declined     Chief Complaint  Patient presents with   Coagulation Disorder    INR check   Discussed the use of AI scribe software for clinical note transcription with the patient, who gave verbal consent to proceed.  HPI: Patient is a 73 y.o. female seen today for INR check. She was accompanied by her husband.  She is currently on Coumadin  therapy following a stroke, with her last INR recorded at 3.9, leading to a temporary hold on the medication for two days. Her current INR is 2.9, and she is taking 6 mg of Coumadin  daily, using 5 mg and 1 mg tablets. She recently consumed green leafy vegetables three times, which may have influenced her INR levels.  She has a history of stroke resulting in Right hemiplegia. She uses a support brace for her right ankle.  Her medical history includes seizures, and she is on Keppra  750 mg twice daily and Vimpat  100 mg twice daily. Her caregiver does not recall the last seizure occurrence.  She has a history of heart disease, having undergone a heart operation with stent placement approximately three to four years ago. She has heart failure and is on multiple medications including Lasix  40 mg daily, Jardiance  10 mg daily, Entresto  97/103 mg twice daily, and spironolactone  25 mg daily.  She is also on atorvastatin  80 mg daily and Zetia  10 mg daily for hyperlipidemia management.     Past Medical History:  Diagnosis Date   Adenomatous polyp of colon 09/2012   repeat colonoscopy in 5 years by Dr. Jakie   CHF (congestive heart failure) (HCC)    EF 15-20% as of 05/28/11 (Dr Ozell Rigby-Hospital D/C summary)   CVA (cerebral infarction) 12/2007   H/O heart bypass surgery  2022   Hemiparesis (HCC)    Hyperlipidemia    Hypertension    Seizures (HCC)    Sickle cell anemia (HCC)    Stroke (HCC)    Vitamin D  deficiency 2010    Past Surgical History:  Procedure Laterality Date   ABDOMINAL HYSTERECTOMY     BIV PACEMAKER INSERTION CRT-P N/A 04/29/2020   Procedure: BIV PACEMAKER INSERTION CRT-P;  Surgeon: Inocencio Soyla Lunger, MD;  Location: [facility] INVASIVE CV LAB;  Service: Cardiovascular;  Laterality: N/A;   CORONARY ARTERY BYPASS GRAFT N/A 04/25/2020   Procedure: CORONARY ARTERY BYPASS GRAFTING (CABG) TIMES TWO USING LEFT INTERNAL MAMMARY ARTERY AND RIGHT GREATER SAPHENOUS VEIN HARVESTED ENDOSCOPICALLY;  Surgeon: Kerrin Elspeth BROCKS, MD;  Location: [facility] OR;  Service: Open Heart Surgery;  Laterality: N/A;   FRACTURE SURGERY     HIP SURGERY     LEFT HEART CATH AND CORONARY ANGIOGRAPHY N/A 04/22/2020   Procedure: LEFT HEART CATH AND CORONARY ANGIOGRAPHY;  Surgeon: Rolan Ezra RAMAN, MD;  Location: [facility] INVASIVE CV LAB;  Service: Cardiovascular;  Laterality: N/A;   TEE WITHOUT CARDIOVERSION  04/25/2020   Procedure: TRANSESOPHAGEAL ECHOCARDIOGRAM (TEE);  Surgeon: Kerrin Elspeth BROCKS, MD;  Location: [facility] OR;  Service: Open Heart Surgery;;   TEMPORARY PACEMAKER N/A 04/21/2020   Procedure: TEMPORARY PACEMAKER;  Surgeon: Rolan Ezra RAMAN, MD;  Location: [facility] INVASIVE CV LAB;  Service: Cardiovascular;  Laterality: N/A;   TUBAL LIGATION  Allergies  Allergen Reactions   Lexiscan  [Regadenoson ] Other (See Comments)    Perioral tingling and throat tightness    Outpatient Encounter Medications as of 08/02/2023  Medication Sig   acetaminophen  (TYLENOL ) 325 MG tablet Take 650 mg by mouth 2 (two) times a week. As needed   atorvastatin  (LIPITOR ) 80 MG tablet TAKE 1 TABLET(80 MG) BY MOUTH AT BEDTIME   Cholecalciferol 50 MCG (2000 UT) TABS Take 1 tablet by mouth daily in the afternoon.   Ensure (ENSURE) Take 237 mLs by mouth daily.   ezetimibe  (ZETIA ) 10 MG tablet Take 1 tablet (10 mg  total) by mouth daily.   furosemide  (LASIX ) 20 MG tablet Take 2 tablets (40 mg total) by mouth daily.   JARDIANCE  10 MG TABS tablet TAKE 1 TABLET(10 MG) BY MOUTH DAILY   Lacosamide  (VIMPAT ) 100 MG TABS Take 1 tablet (100 mg total) by mouth 2 (two) times daily.   levETIRAcetam  (KEPPRA ) 750 MG tablet Take 2 tablets (1,500 mg total) by mouth 2 (two) times daily.   metoprolol  succinate (TOPROL -XL) 50 MG 24 hr tablet Take 1 tablet (50 mg total) by mouth daily. Take with or immediately following a meal.   nystatin  cream (MYCOSTATIN ) Apply 1 Application topically as needed for dry skin.   sacubitril -valsartan  (ENTRESTO ) 97-103 MG Take 1 tablet by mouth 2 (two) times daily.   spironolactone  (ALDACTONE ) 25 MG tablet TAKE 1 TABLET(25 MG) BY MOUTH DAILY   warfarin (COUMADIN ) 5 MG tablet TAKE 1 TABLET(5 MG) BY MOUTH DAILY   [DISCONTINUED] warfarin (COUMADIN ) 1 MG tablet TAKE 1 TABLET(1 MG) BY MOUTH DAILY   No facility-administered encounter medications on file as of 08/02/2023.    Review of Systems:  Review of Systems  Constitutional:  Negative for appetite change, chills, fatigue and fever.  HENT:  Negative for congestion, hearing loss, rhinorrhea and sore throat.   Eyes: Negative.   Respiratory:  Negative for cough, shortness of breath and wheezing.   Cardiovascular:  Negative for chest pain, palpitations and leg swelling.  Gastrointestinal:  Negative for abdominal pain, constipation, diarrhea, nausea and vomiting.  Genitourinary:  Negative for dysuria.  Musculoskeletal:  Negative for arthralgias, back pain and myalgias.  Skin:  Negative for color change, rash and wound.  Neurological:  Negative for dizziness, weakness and headaches.  Psychiatric/Behavioral:  Negative for behavioral problems. The patient is not nervous/anxious.     Health Maintenance  Topic Date Due   Fecal DNA (Cologuard)  Never done   COVID-19 Vaccine (4 - 2024-25 season) 09/23/2023 (Originally 09/23/2022)   Zoster Vaccines-  Shingrix (1 of 2) 09/25/2023 (Originally 01/09/1970)   DTaP/Tdap/Td (2 - Td or Tdap) 06/24/2024 (Originally 12/13/2022)   INFLUENZA VACCINE  08/23/2023   MAMMOGRAM  12/12/2023   Medicare Annual Wellness (AWV)  03/31/2024   Pneumococcal Vaccine: 50+ Years  Completed   DEXA SCAN  Completed   Hepatitis C Screening  Completed   Hepatitis B Vaccines  Aged Out   HPV VACCINES  Aged Out   Meningococcal B Vaccine  Aged Out   Colonoscopy  Discontinued    Physical Exam: Vitals:   08/02/23 0933  BP: 122/75  Pulse: 69  Resp: 18  Temp: (!) 96.6 F (35.9 C)  SpO2: 96%  Weight: 139 lb (63 kg)  Height: 5' 7 (1.702 m)   Body mass index is 21.77 kg/m. Physical Exam Constitutional:      Appearance: Normal appearance.  HENT:     Head: Normocephalic and atraumatic.  Nose: Nose normal.     Mouth/Throat:     Mouth: Mucous membranes are moist.  Eyes:     Conjunctiva/sclera: Conjunctivae normal.  Cardiovascular:     Rate and Rhythm: Normal rate and regular rhythm.  Pulmonary:     Effort: Pulmonary effort is normal.     Breath sounds: Normal breath sounds.  Abdominal:     General: Bowel sounds are normal.     Palpations: Abdomen is soft.  Musculoskeletal:     Cervical back: Normal range of motion.     Comments: Brace on right ankle  Skin:    General: Skin is warm and dry.  Neurological:     Mental Status: She is alert.     Comments: Unable to move RUE, weakness on RLE, slurred speech  Psychiatric:        Mood and Affect: Mood normal.        Behavior: Behavior normal.        Thought Content: Thought content normal.        Judgment: Judgment normal.     Labs reviewed: Basic Metabolic Panel: Recent Labs    12/27/22 0931 01/25/23 1000 05/16/23 1009  NA 138 137 137  K 4.7 4.3 4.2  CL 107 105 104  CO2 23 21* 20*  GLUCOSE 79 95 77  BUN 28* 28* 25*  CREATININE 1.21* 1.18* 1.39*  CALCIUM  9.2 9.0 9.1   Liver Function Tests: No results for input(s): AST, ALT,  ALKPHOS, BILITOT, PROT, ALBUMIN  in the last 8760 hours. No results for input(s): LIPASE, AMYLASE in the last 8760 hours. No results for input(s): AMMONIA in the last 8760 hours. CBC: Recent Labs    10/03/22 1010  WBC 5.9  NEUTROABS 3,463  HGB 12.5  HCT 38.6  MCV 88.1  PLT 304   Lipid Panel: Recent Labs    05/16/23 1009 07/16/23 1004  CHOL 125 104  HDL 41 40*  LDLCALC 68 51  TRIG 80 65  CHOLHDL 3.0 2.6   Lab Results  Component Value Date   HGBA1C 6.1 (H) 06/06/2022    Procedures since last visit: CUP PACEART REMOTE DEVICE CHECK Result Date: 07/31/2023 PPM Scheduled remote reviewed. Normal device function.  Presenting rhythm:  AS/BiV pace Next remote 91 days. LA, CVRS   Assessment/Plan  1. Hemiplegia of dominant side as late effect following cerebrovascular disease (HCC) -  Right-sided hemiplegia with complete immobility of the right upper extremity and weakness in the right lower extremity. Uses brace for right ankle drop. -  continue Atorvastatin , Zetia  and Coumadin   2. Chronic anticoagulation (Primary) -  On Coumadin  for stroke prevention. Previous INR 3.9, current INR 2.9. Advised to avoid green leafy vegetables to maintain stable INR. - Discontinue 1 mg Coumadin . - Continue 5 mg Coumadin  daily. - Return in one week for INR check. - POC INR  3. Mixed hyperlipidemia Wt Readings from Last 3 Encounters:  08/02/23 139 lb (63 kg)  06/20/23 136 lb (61.7 kg)  05/21/23 136 lb (61.7 kg)    -  continue Zetia  and Atorvastatin   4. Seizure disorder (HCC) -  On Keppra  and Vimpat  for seizure management. No recall of last seizure episode. - Continue Keppra  750 mg twice daily. - Continue Vimpat  100 mg twice daily.  5. Chronic combined systolic and diastolic heart failure (HCC) -  On Lasix , Jardiance , Entresto , and spironolactone  for heart failure management. - Continue Lasix  40 mg daily. - Continue Jardiance  10 mg daily. - Continue Entresto  97/103 mg  twice daily. - Continue spironolactone  25 mg daily.      Labs/tests ordered:   INR   Return in about 1 week (around 08/09/2023).  Ninah Moccio Medina-Vargas, NP

## 2023-08-08 ENCOUNTER — Ambulatory Visit: Payer: Self-pay | Admitting: Cardiology

## 2023-08-08 NOTE — Assessment & Plan Note (Signed)
 taking Coumadin 

## 2023-08-08 NOTE — Assessment & Plan Note (Addendum)
 Edema/CHF, taking Furosemide , Entresto , Jardiance ,  Aldactone , BNP 558.1 10/26/22

## 2023-08-08 NOTE — Assessment & Plan Note (Signed)
 on Metoprolol 

## 2023-08-08 NOTE — Assessment & Plan Note (Signed)
 on Vimpat , Keppra 

## 2023-08-08 NOTE — Progress Notes (Unsigned)
 Location:   Clinic [facility]   Place of Service:    Provider: Larwance Hark NP  Code Status: DNR Goals of Care:     04/01/2023   11:12 AM  Advanced Directives  Does Patient Have a Medical Advance Directive? No  Would patient like information on creating a medical advance directive? No - Patient declined     Chief Complaint  Patient presents with   Follow-up    HPI: Patient is a 73 y.o. female seen today for an acute visit for Discussed the use of AI scribe software for clinical note transcription with the patient, who gave verbal consent to proceed.  History of Present Illness   HLD on Atorvastatin , Zetia , LDL 51 07/16/23  Stroke, taking Coumadin   Seizure, on Vimpat , Keppra   HTN, on Metoprolol   Edema/CHF, taking Furosemide , Jardiance ,  Entresto , Aldactone , BNP 558.1 10/26/22  CKD Bun/creat 25/1.39 05/16/23   Past Medical History:  Diagnosis Date   Adenomatous polyp of colon 09/2012   repeat colonoscopy in 5 years by Dr. Jakie   CHF (congestive heart failure) (HCC)    EF 15-20% as of 05/28/11 (Dr Ozell Rigby-Hospital D/C summary)   CVA (cerebral infarction) 12/2007   H/O heart bypass surgery 2022   Hemiparesis (HCC)    Hyperlipidemia    Hypertension    Seizures (HCC)    Sickle cell anemia (HCC)    Stroke (HCC)    Vitamin D  deficiency 2010    Past Surgical History:  Procedure Laterality Date   ABDOMINAL HYSTERECTOMY     BIV PACEMAKER INSERTION CRT-P N/A 04/29/2020   Procedure: BIV PACEMAKER INSERTION CRT-P;  Surgeon: Inocencio Soyla Lunger, MD;  Location: [facility] INVASIVE CV LAB;  Service: Cardiovascular;  Laterality: N/A;   CORONARY ARTERY BYPASS GRAFT N/A 04/25/2020   Procedure: CORONARY ARTERY BYPASS GRAFTING (CABG) TIMES TWO USING LEFT INTERNAL MAMMARY ARTERY AND RIGHT GREATER SAPHENOUS VEIN HARVESTED ENDOSCOPICALLY;  Surgeon: Kerrin Elspeth BROCKS, MD;  Location: [facility] OR;  Service: Open Heart Surgery;  Laterality: N/A;   FRACTURE SURGERY     HIP SURGERY     LEFT HEART  CATH AND CORONARY ANGIOGRAPHY N/A 04/22/2020   Procedure: LEFT HEART CATH AND CORONARY ANGIOGRAPHY;  Surgeon: Rolan Ezra RAMAN, MD;  Location: [facility] INVASIVE CV LAB;  Service: Cardiovascular;  Laterality: N/A;   TEE WITHOUT CARDIOVERSION  04/25/2020   Procedure: TRANSESOPHAGEAL ECHOCARDIOGRAM (TEE);  Surgeon: Kerrin Elspeth BROCKS, MD;  Location: [facility] OR;  Service: Open Heart Surgery;;   TEMPORARY PACEMAKER N/A 04/21/2020   Procedure: TEMPORARY PACEMAKER;  Surgeon: Rolan Ezra RAMAN, MD;  Location: [facility] INVASIVE CV LAB;  Service: Cardiovascular;  Laterality: N/A;   TUBAL LIGATION      Allergies  Allergen Reactions   Lexiscan  [Regadenoson ] Other (See Comments)    Perioral tingling and throat tightness    Allergies as of 08/09/2023       Reactions   Lexiscan  [regadenoson ] Other (See Comments)   Perioral tingling and throat tightness        Medication List        Accurate as of August 08, 2023  4:27 PM. If you have any questions, ask your nurse or doctor.          acetaminophen  325 MG tablet Commonly known as: TYLENOL  Take 650 mg by mouth 2 (two) times a week. As needed   atorvastatin  80 MG tablet Commonly known as: LIPITOR  TAKE 1 TABLET(80 MG) BY MOUTH AT BEDTIME   Cholecalciferol 50 MCG (2000 UT) Tabs Take 1  tablet by mouth daily in the afternoon.   Ensure Take 237 mLs by mouth daily.   Entresto  97-103 MG Generic drug: sacubitril -valsartan  Take 1 tablet by mouth 2 (two) times daily.   ezetimibe  10 MG tablet Commonly known as: ZETIA  Take 1 tablet (10 mg total) by mouth daily.   furosemide  20 MG tablet Commonly known as: LASIX  Take 2 tablets (40 mg total) by mouth daily.   Jardiance  10 MG Tabs tablet Generic drug: empagliflozin  TAKE 1 TABLET(10 MG) BY MOUTH DAILY   Lacosamide  100 MG Tabs Commonly known as: Vimpat  Take 1 tablet (100 mg total) by mouth 2 (two) times daily.   levETIRAcetam  750 MG tablet Commonly known as: KEPPRA  Take 2 tablets (1,500 mg total) by mouth  2 (two) times daily.   metoprolol  succinate 50 MG 24 hr tablet Commonly known as: TOPROL -XL Take 1 tablet (50 mg total) by mouth daily. Take with or immediately following a meal.   nystatin  cream Commonly known as: MYCOSTATIN  Apply 1 Application topically as needed for dry skin.   spironolactone  25 MG tablet Commonly known as: ALDACTONE  TAKE 1 TABLET(25 MG) BY MOUTH DAILY   warfarin 5 MG tablet Commonly known as: COUMADIN  TAKE 1 TABLET(5 MG) BY MOUTH DAILY        Review of Systems:  Review of Systems  Constitutional:  Negative for appetite change and fatigue.  HENT:  Negative for congestion and trouble swallowing.   Eyes:  Negative for visual disturbance.  Respiratory:  Negative for cough and wheezing.   Cardiovascular:  Positive for leg swelling.  Gastrointestinal:  Negative for abdominal pain and constipation.  Genitourinary:  Negative for dysuria and urgency.  Musculoskeletal:  Positive for arthralgias and gait problem.  Skin:  Negative for color change.  Neurological:  Positive for seizures and weakness.  Psychiatric/Behavioral:  Positive for confusion. The patient is not nervous/anxious.     Health Maintenance  Topic Date Due   Fecal DNA (Cologuard)  Never done   COVID-19 Vaccine (4 - 2024-25 season) 09/23/2023 (Originally 09/23/2022)   Zoster Vaccines- Shingrix (1 of 2) 09/25/2023 (Originally 01/09/1970)   DTaP/Tdap/Td (2 - Td or Tdap) 06/24/2024 (Originally 12/13/2022)   INFLUENZA VACCINE  08/23/2023   MAMMOGRAM  12/12/2023   Medicare Annual Wellness (AWV)  03/31/2024   Pneumococcal Vaccine: 50+ Years  Completed   DEXA SCAN  Completed   Hepatitis C Screening  Completed   Hepatitis B Vaccines  Aged Out   HPV VACCINES  Aged Out   Meningococcal B Vaccine  Aged Out   Colonoscopy  Discontinued    Physical Exam: There were no vitals filed for this visit. There is no height or weight on file to calculate BMI. Physical Exam Vitals and nursing note reviewed.   Constitutional:      Appearance: Normal appearance.  HENT:     Head: Normocephalic and atraumatic.     Nose: Nose normal.     Mouth/Throat:     Mouth: Mucous membranes are moist.  Eyes:     Extraocular Movements: Extraocular movements intact.     Conjunctiva/sclera: Conjunctivae normal.     Pupils: Pupils are equal, round, and reactive to light.  Cardiovascular:     Rate and Rhythm: Normal rate and regular rhythm.     Heart sounds: No murmur heard. Pulmonary:     Effort: Pulmonary effort is normal.     Breath sounds: No wheezing, rhonchi or rales.  Abdominal:     General: Bowel sounds are normal.  Palpations: Abdomen is soft.     Tenderness: There is no abdominal tenderness.  Musculoskeletal:        General: No tenderness. Normal range of motion.     Cervical back: Normal range of motion and neck supple.     Right lower leg: No edema.     Left lower leg: No edema.  Skin:    General: Skin is warm and dry.     Findings: No rash.  Neurological:     General: No focal deficit present.     Mental Status: She is alert and oriented to person, place, and time. Mental status is at baseline.     Motor: Weakness present.     Coordination: Coordination normal.     Gait: Gait abnormal.     Comments: RLE weakness.  Psychiatric:        Mood and Affect: Mood normal.        Behavior: Behavior normal.        Thought Content: Thought content normal.        Judgment: Judgment normal.     Labs reviewed: Basic Metabolic Panel: Recent Labs    12/27/22 0931 01/25/23 1000 05/16/23 1009  NA 138 137 137  K 4.7 4.3 4.2  CL 107 105 104  CO2 23 21* 20*  GLUCOSE 79 95 77  BUN 28* 28* 25*  CREATININE 1.21* 1.18* 1.39*  CALCIUM  9.2 9.0 9.1   Liver Function Tests: No results for input(s): AST, ALT, ALKPHOS, BILITOT, PROT, ALBUMIN  in the last 8760 hours. No results for input(s): LIPASE, AMYLASE in the last 8760 hours. No results for input(s): AMMONIA in the last 8760  hours. CBC: Recent Labs    10/03/22 1010  WBC 5.9  NEUTROABS 3,463  HGB 12.5  HCT 38.6  MCV 88.1  PLT 304   Lipid Panel: Recent Labs    05/16/23 1009 07/16/23 1004  CHOL 125 104  HDL 41 40*  LDLCALC 68 51  TRIG 80 65  CHOLHDL 3.0 2.6   Lab Results  Component Value Date   HGBA1C 6.1 (H) 06/06/2022    Procedures since last visit: CUP PACEART REMOTE DEVICE CHECK Result Date: 07/31/2023 PPM Scheduled remote reviewed. Normal device function.  Presenting rhythm:  AS/BiV pace Next remote 91 days. LA, CVRS   Assessment/Plan Assessment and Plan Assessment & Plan       Labs/tests ordered:  * No order type specified * Next appt:  Visit date not found

## 2023-08-08 NOTE — Assessment & Plan Note (Signed)
 on Atorvastatin , Zetia , LDL 51 07/16/23

## 2023-08-09 ENCOUNTER — Encounter: Payer: Self-pay | Admitting: Advanced Practice Midwife

## 2023-08-09 ENCOUNTER — Ambulatory Visit (INDEPENDENT_AMBULATORY_CARE_PROVIDER_SITE_OTHER): Admitting: Nurse Practitioner

## 2023-08-09 ENCOUNTER — Encounter: Payer: Self-pay | Admitting: Nurse Practitioner

## 2023-08-09 VITALS — BP 118/80 | HR 63 | Temp 98.2°F | Ht 67.0 in | Wt 136.0 lb

## 2023-08-09 DIAGNOSIS — I69959 Hemiplegia and hemiparesis following unspecified cerebrovascular disease affecting unspecified side: Secondary | ICD-10-CM

## 2023-08-09 DIAGNOSIS — I10 Essential (primary) hypertension: Secondary | ICD-10-CM | POA: Diagnosis not present

## 2023-08-09 DIAGNOSIS — Z7901 Long term (current) use of anticoagulants: Secondary | ICD-10-CM | POA: Diagnosis not present

## 2023-08-09 DIAGNOSIS — G40909 Epilepsy, unspecified, not intractable, without status epilepticus: Secondary | ICD-10-CM

## 2023-08-09 DIAGNOSIS — I63412 Cerebral infarction due to embolism of left middle cerebral artery: Secondary | ICD-10-CM

## 2023-08-09 DIAGNOSIS — E782 Mixed hyperlipidemia: Secondary | ICD-10-CM | POA: Diagnosis not present

## 2023-08-09 DIAGNOSIS — I5042 Chronic combined systolic (congestive) and diastolic (congestive) heart failure: Secondary | ICD-10-CM

## 2023-08-09 LAB — POCT INR: POC INR: 2.4

## 2023-08-09 NOTE — Assessment & Plan Note (Signed)
 INT 2.4<<2.9 a week ago, therapeutic, no change of Coumadin , PT-INR 4 weeks.

## 2023-08-09 NOTE — Patient Instructions (Signed)
 Please schedule a 4 week PT/INR check

## 2023-08-09 NOTE — Assessment & Plan Note (Signed)
 Life chair for mobility

## 2023-08-12 MED ORDER — LACOSAMIDE 100 MG PO TABS: 100.0000 mg | ORAL_TABLET | Freq: Two times a day (BID) | ORAL | 1 refills | Status: DC

## 2023-08-12 NOTE — Addendum Note (Signed)
 Addended by: DOUGLASS DELON CROME on: 08/12/2023 12:24 PM   Modules accepted: Orders

## 2023-08-12 NOTE — Telephone Encounter (Signed)
 Pt husband called to request medication refill   Lacosamide  (VIMPAT ) 100 MG TABS   Pt would like medication to be sent to    [facility] [place], [place] - [address] (Ph: [phone])

## 2023-08-12 NOTE — Telephone Encounter (Addendum)
 Last seen on 06/11/23 Follow up scheduled on 06/16/24   I called [facility] pharmacy pt should have refill from 06/11/23 #180 with 1 refill was sent. Pharmacy states they never received the Rx. Pharmacy tech asked if verbal can be done via phone with pharmacist.    Spoke with pharmacist [place] and I was told verbal can be given since I am calling from [facility]. verbal for Rx that was sent on 06/11/23 with same directions and quality.   I called and spoke with [name] and informed him that Rx has been called in.

## 2023-08-21 ENCOUNTER — Encounter: Payer: Self-pay | Admitting: Podiatry

## 2023-08-21 ENCOUNTER — Ambulatory Visit: Admitting: Podiatry

## 2023-08-21 DIAGNOSIS — M79674 Pain in right toe(s): Secondary | ICD-10-CM

## 2023-08-21 DIAGNOSIS — M79675 Pain in left toe(s): Secondary | ICD-10-CM

## 2023-08-21 DIAGNOSIS — B351 Tinea unguium: Secondary | ICD-10-CM

## 2023-08-21 NOTE — Progress Notes (Unsigned)
  Subjective:  Patient ID: [name], female    DOB: 03-31-1950,  MRN: [id number]  [name] presents to clinic today for at risk foot care with h/o clotting disorder and painful thick toenails that are difficult to trim. Pain interferes with ambulation. Aggravating factors include wearing enclosed shoe gear. Pain is relieved with periodic professional debridement. No chief complaint on file.  New problem(s): None.   PCP is Sherlynn Madden, MD.  Allergies  Allergen Reactions   Lexiscan  [Regadenoson ] Other (See Comments)    Perioral tingling and throat tightness    Review of Systems: Negative except as noted in the HPI.  Objective: No changes noted in today's physical examination. There were no vitals filed for this visit. [name] is a pleasant 73 y.o. female {jgbodyhabitus:24098} AAO x 3.  Vascular Examination: Capillary refill time immediate b/l. Vascular status intact b/l with palpable pedal pulses. Pedal hair present b/l. No pain with calf compression b/l. Skin temperature gradient WNL b/l. No cyanosis or clubbing b/l. No ischemia or gangrene noted b/l. {jgvascular:23595}  Neurological Examination: Sensation grossly intact b/l with 10 gram monofilament. Vibratory sensation intact b/l. {jgneuro:23601}  Dermatological Examination: Pedal skin with normal turgor, texture and tone b/l.  No open wounds. No interdigital macerations.   Toenails 1-5 b/l thick, discolored, elongated with subungual debris and pain on dorsal palpation.   No corns, calluses, nor porokeratotic lesions.  {jgderm:23598}  Musculoskeletal Examination: {jgmsk:23600}  Radiographs: None  {jgxrayfindings:23683}  Last A1c:       No data to display          Assessment/Plan: 1. Pain due to onychomycosis of toenails of both feet     No orders of the defined types were placed in this encounter.   None Patient was evaluated and treated. All patient's and/or  POA's questions/concerns addressed on today's visit. Toenails 1-5 debrided in length and girth without incident. Continue soft, supportive shoe gear daily. Report any pedal injuries to medical professional. Call office if there are any questions/concerns. -Patient/POA to call should there be question/concern in the interim.   Return in about 3 months (around 11/21/2023).  Delon LITTIE Merlin, DPM      [facility] LOCATION: [address] [place] [address]                   Office [phone]   [facility] LOCATION: [address] [place], [place] [address] Office [phone]

## 2023-08-23 ENCOUNTER — Other Ambulatory Visit: Payer: Self-pay | Admitting: Sports Medicine

## 2023-08-23 ENCOUNTER — Encounter: Payer: Self-pay | Admitting: Podiatry

## 2023-08-23 DIAGNOSIS — Z7901 Long term (current) use of anticoagulants: Secondary | ICD-10-CM

## 2023-09-10 ENCOUNTER — Ambulatory Visit (INDEPENDENT_AMBULATORY_CARE_PROVIDER_SITE_OTHER): Admitting: Sports Medicine

## 2023-09-10 ENCOUNTER — Encounter: Payer: Self-pay | Admitting: Sports Medicine

## 2023-09-10 VITALS — BP 90/64 | HR 75 | Temp 97.5°F | Resp 12 | Ht 67.0 in

## 2023-09-10 DIAGNOSIS — L989 Disorder of the skin and subcutaneous tissue, unspecified: Secondary | ICD-10-CM | POA: Diagnosis not present

## 2023-09-10 DIAGNOSIS — I69959 Hemiplegia and hemiparesis following unspecified cerebrovascular disease affecting unspecified side: Secondary | ICD-10-CM

## 2023-09-10 DIAGNOSIS — I5042 Chronic combined systolic (congestive) and diastolic (congestive) heart failure: Secondary | ICD-10-CM | POA: Diagnosis not present

## 2023-09-10 DIAGNOSIS — I959 Hypotension, unspecified: Secondary | ICD-10-CM

## 2023-09-10 DIAGNOSIS — Z7901 Long term (current) use of anticoagulants: Secondary | ICD-10-CM

## 2023-09-10 LAB — POCT INR: POC INR: 3.1

## 2023-09-10 MED ORDER — METOPROLOL SUCCINATE ER 50 MG PO TB24: 25.0000 mg | ORAL_TABLET | Freq: Every day | ORAL | Status: DC

## 2023-09-10 NOTE — Patient Instructions (Signed)
 New coumadin  dosage 5 mg every day except Sunday

## 2023-09-10 NOTE — Progress Notes (Signed)
 Careteam: Patient Care Team: Sherlynn Madden, MD as PCP - General (Internal Medicine) Inocencio Soyla Lunger, MD as PCP - Electrophysiology (Cardiology) Jeffrie Oneil BROCKS, MD as PCP - Cardiology (Cardiology) Ladona Heinz, MD as Attending Physician (Cardiology) Shepard Ade, MD (Internal Medicine) Raford Riggs, MD as Attending Physician (Cardiology) [name], Eduard SAUNDERS, MD as Consulting Physician (Neurology) Morgan, [name] CROME, RN as [facility] Care Management  PLACE OF SERVICE:  [facility]  Advanced Directive information    Allergies  Allergen Reactions   Lexiscan  [Regadenoson ] Other (See Comments)    Perioral tingling and throat tightness    Chief Complaint  Patient presents with   Anticoagulation    PT/INR check      Discussed the use of AI scribe software for clinical note transcription with the patient, who gave verbal consent to proceed.  History of Present Illness  [name] is a 73 year old female who presents for INR management and evaluation of a right thigh bump.  She is on Coumadin  therapy with an INR of 3.1. No bleeding symptoms such as hematuria, melena, or gingival bleeding are present. There have been no recent dietary changes affecting INR levels. Her current regimen is 5 mg of Coumadin  daily, previously adjusted from 6 mg daily.  She noticed a bump on her right thigh about a week ago. The bump is hard, has decreased in size, and is slightly tender without significant pain. There is no oozing, drainage, or associated fever.  Her medication regimen includes metoprolol , which was increased to 50 mg in May but reduced back to 25 mg in late July due to fatigue, weakness, and decreased stamina. Her stamina has returned to normal since the reduction. She reports slightly low blood pressure, but her fluid intake is consistent.  She is able to stand and walk a few steps at home with a walker. No chest pain, dyspnea, or fever. Her  appetite is good.    Review of Systems:  Review of Systems  Constitutional:  Negative for chills and fever.  HENT:  Negative for congestion and sore throat.   Respiratory:  Negative for cough, sputum production and shortness of breath.   Cardiovascular:  Negative for chest pain, palpitations and leg swelling.  Gastrointestinal:  Negative for abdominal pain, heartburn and nausea.  Genitourinary:  Negative for dysuria, frequency and hematuria.  Musculoskeletal:  Negative for falls and myalgias.  Neurological:  Negative for dizziness.   Negative unless indicated in HPI.   Past Medical History:  Diagnosis Date   Adenomatous polyp of colon 09/2012   repeat colonoscopy in 5 years by Dr. Jakie   CHF (congestive heart failure) (HCC)    EF 15-20% as of 05/28/11 (Dr Ozell Rigby-Hospital D/C summary)   CVA (cerebral infarction) 12/2007   H/O heart bypass surgery 2022   Hemiparesis (HCC)    Hyperlipidemia    Hypertension    Seizures (HCC)    Sickle cell anemia (HCC)    Stroke (HCC)    Vitamin D  deficiency 2010   Past Surgical History:  Procedure Laterality Date   ABDOMINAL HYSTERECTOMY     BIV PACEMAKER INSERTION CRT-P N/A 04/29/2020   Procedure: BIV PACEMAKER INSERTION CRT-P;  Surgeon: Inocencio Soyla Lunger, MD;  Location: [facility] INVASIVE CV LAB;  Service: Cardiovascular;  Laterality: N/A;   CORONARY ARTERY BYPASS GRAFT N/A 04/25/2020   Procedure: CORONARY ARTERY BYPASS GRAFTING (CABG) TIMES TWO USING LEFT INTERNAL MAMMARY ARTERY AND RIGHT GREATER SAPHENOUS VEIN HARVESTED ENDOSCOPICALLY;  Surgeon: Kerrin,  Elspeth BROCKS, MD;  Location: [facility] OR;  Service: Open Heart Surgery;  Laterality: N/A;   FRACTURE SURGERY     HIP SURGERY     LEFT HEART CATH AND CORONARY ANGIOGRAPHY N/A 04/22/2020   Procedure: LEFT HEART CATH AND CORONARY ANGIOGRAPHY;  Surgeon: Rolan Ezra RAMAN, MD;  Location: [facility] INVASIVE CV LAB;  Service: Cardiovascular;  Laterality: N/A;   TEE WITHOUT CARDIOVERSION  04/25/2020   Procedure:  TRANSESOPHAGEAL ECHOCARDIOGRAM (TEE);  Surgeon: Kerrin Elspeth BROCKS, MD;  Location: [facility] OR;  Service: Open Heart Surgery;;   TEMPORARY PACEMAKER N/A 04/21/2020   Procedure: TEMPORARY PACEMAKER;  Surgeon: Rolan Ezra RAMAN, MD;  Location: [facility] INVASIVE CV LAB;  Service: Cardiovascular;  Laterality: N/A;   TUBAL LIGATION     Social History:   reports that she quit smoking about 15 years ago. Her smoking use included cigarettes. She has never used smokeless tobacco. She reports that she does not currently use alcohol. She reports that she does not use drugs.  Family History  Problem Relation Age of Onset   Diabetes Daughter    High Cholesterol Daughter    Hypertension Daughter    Heart disease Daughter    Hypertension Sister    High Cholesterol Sister    Asthma Brother    Bronchitis Brother    Colon cancer Maternal Grandfather     Medications: Patient's Medications  New Prescriptions   No medications on file  Previous Medications   ACETAMINOPHEN  (TYLENOL ) 325 MG TABLET    Take 650 mg by mouth 2 (two) times a week. As needed   ATORVASTATIN  (LIPITOR ) 80 MG TABLET    TAKE 1 TABLET(80 MG) BY MOUTH AT BEDTIME   CHOLECALCIFEROL 50 MCG (2000 UT) TABS    Take 1 tablet by mouth daily in the afternoon.   ENSURE (ENSURE)    Take 237 mLs by mouth daily.   EZETIMIBE  (ZETIA ) 10 MG TABLET    Take 1 tablet (10 mg total) by mouth daily.   FUROSEMIDE  (LASIX ) 20 MG TABLET    Take 2 tablets (40 mg total) by mouth daily.   JARDIANCE  10 MG TABS TABLET    TAKE 1 TABLET(10 MG) BY MOUTH DAILY   LACOSAMIDE  (VIMPAT ) 100 MG TABS    Take 1 tablet (100 mg total) by mouth 2 (two) times daily.   LEVETIRACETAM  (KEPPRA ) 750 MG TABLET    Take 2 tablets (1,500 mg total) by mouth 2 (two) times daily.   METOPROLOL  SUCCINATE (TOPROL -XL) 50 MG 24 HR TABLET    Take 1 tablet (50 mg total) by mouth daily. Take with or immediately following a meal.   NYSTATIN  CREAM (MYCOSTATIN )    Apply 1 Application topically as needed for dry  skin.   SACUBITRIL -VALSARTAN  (ENTRESTO ) 97-103 MG    Take 1 tablet by mouth 2 (two) times daily.   SPIRONOLACTONE  (ALDACTONE ) 25 MG TABLET    TAKE 1 TABLET(25 MG) BY MOUTH DAILY   WARFARIN (COUMADIN ) 5 MG TABLET    TAKE 1 TABLET(5 MG) BY MOUTH DAILY  Modified Medications   No medications on file  Discontinued Medications   No medications on file    Physical Exam: Vitals:   09/10/23 0931  BP: 90/64  Pulse: 75  Resp: 12  Temp: (!) 97.5 F (36.4 C)  SpO2: 93%  Height: 5' 7 (1.702 m)   Body mass index is 21.3 kg/m. BP Readings from Last 3 Encounters:  09/10/23 90/64  08/09/23 118/80  08/02/23 122/75   Wt Readings from  Last 3 Encounters:  08/09/23 136 lb (61.7 kg)  08/02/23 139 lb (63 kg)  06/20/23 136 lb (61.7 kg)    Physical Exam Constitutional:      Appearance: Normal appearance.  HENT:     Head: Normocephalic and atraumatic.  Cardiovascular:     Rate and Rhythm: Normal rate and regular rhythm.  Pulmonary:     Effort: Pulmonary effort is normal. No respiratory distress.     Breath sounds: Normal breath sounds. No wheezing.  Abdominal:     General: Bowel sounds are normal. There is no distension.     Tenderness: There is no abdominal tenderness. There is no guarding or rebound.     Comments:    Musculoskeletal:        General: No swelling.  Neurological:     Mental Status: She is alert. Mental status is at baseline.     Comments: Residual weakness on Rt side     Labs reviewed: Basic Metabolic Panel: Recent Labs    12/27/22 0931 01/25/23 1000 05/16/23 1009  NA 138 137 137  K 4.7 4.3 4.2  CL 107 105 104  CO2 23 21* 20*  GLUCOSE 79 95 77  BUN 28* 28* 25*  CREATININE 1.21* 1.18* 1.39*  CALCIUM  9.2 9.0 9.1   Liver Function Tests: No results for input(s): AST, ALT, ALKPHOS, BILITOT, PROT, ALBUMIN  in the last 8760 hours. No results for input(s): LIPASE, AMYLASE in the last 8760 hours. No results for input(s): AMMONIA in the last 8760  hours. CBC: Recent Labs    10/03/22 1010  WBC 5.9  NEUTROABS 3,463  HGB 12.5  HCT 38.6  MCV 88.1  PLT 304   Lipid Panel: Recent Labs    05/16/23 1009 07/16/23 1004  CHOL 125 104  HDL 41 40*  LDLCALC 68 51  TRIG 80 65  CHOLHDL 3.0 2.6   TSH: No results for input(s): TSH in the last 8760 hours. A1C: Lab Results  Component Value Date   HGBA1C 6.1 (H) 06/06/2022    Assessment and Plan Assessment & Plan   1. Anticoagulation monitoring, INR range 2.5-3.5 (Primary) INR 3.1 Instructed to take coumadin  5 mg mom to sat except Sunday Follow up in 2 weeks Monitor for signs of bleeding  2. Chronic combined systolic and diastolic heart failure (HCC) Euvolemic on exam Follow up with cardiology  3. Hemiplegia of dominant side as late effect following cerebrovascular disease (HCC) Stable Husband primary caretaker for the patient   4. Hypotension, unspecified hypotension type Denies feeling dizzy or lighteaded Husband says he cut down metoprolol  to 12.5 mg as pt was feeling week 2 weeks ago , she is feeling better now Will follow up in 2 weeks Monitor bp  - metoprolol  succinate (TOPROL -XL) 50 MG 24 hr tablet; Take 0.5 tablets (25 mg total) by mouth daily. Take with or immediately following a meal.  5. Bumps on skin Superficial 1cm bump on medial rt thigh  Husband says its regressing No signs of skin infection

## 2023-09-16 ENCOUNTER — Telehealth (HOSPITAL_COMMUNITY): Payer: Self-pay

## 2023-09-16 NOTE — Telephone Encounter (Signed)
 Called to confirm/remind patient of their appointment at [facility] on 09/17/23.   Appointment:   [] Confirmed  [x] Left mess   [] No answer/No voice mail  [] VM Full/unable to leave message  [] Phone not in service  And to bring in all medications and/or complete list.

## 2023-09-17 ENCOUNTER — Ambulatory Visit (HOSPITAL_COMMUNITY): Payer: Self-pay | Admitting: Family Medicine

## 2023-09-17 ENCOUNTER — Ambulatory Visit (HOSPITAL_COMMUNITY)
Admission: RE | Admit: 2023-09-17 | Discharge: 2023-09-17 | Disposition: A | Discharge status: Home / Self Care | Source: Ambulatory Visit | Attending: Family Medicine | Admitting: Family Medicine

## 2023-09-17 ENCOUNTER — Encounter (HOSPITAL_COMMUNITY): Payer: Self-pay

## 2023-09-17 VITALS — BP 90/54 | HR 73 | Ht 67.0 in | Wt 130.0 lb

## 2023-09-17 DIAGNOSIS — I69951 Hemiplegia and hemiparesis following unspecified cerebrovascular disease affecting right dominant side: Secondary | ICD-10-CM | POA: Insufficient documentation

## 2023-09-17 DIAGNOSIS — Z950 Presence of cardiac pacemaker: Secondary | ICD-10-CM | POA: Diagnosis not present

## 2023-09-17 DIAGNOSIS — Z7984 Long term (current) use of oral hypoglycemic drugs: Secondary | ICD-10-CM | POA: Diagnosis not present

## 2023-09-17 DIAGNOSIS — I5022 Chronic systolic (congestive) heart failure: Secondary | ICD-10-CM | POA: Insufficient documentation

## 2023-09-17 DIAGNOSIS — Z79899 Other long term (current) drug therapy: Secondary | ICD-10-CM | POA: Insufficient documentation

## 2023-09-17 DIAGNOSIS — I442 Atrioventricular block, complete: Secondary | ICD-10-CM | POA: Insufficient documentation

## 2023-09-17 DIAGNOSIS — I255 Ischemic cardiomyopathy: Secondary | ICD-10-CM | POA: Diagnosis not present

## 2023-09-17 DIAGNOSIS — Z7901 Long term (current) use of anticoagulants: Secondary | ICD-10-CM | POA: Diagnosis not present

## 2023-09-17 DIAGNOSIS — Z951 Presence of aortocoronary bypass graft: Secondary | ICD-10-CM | POA: Diagnosis not present

## 2023-09-17 DIAGNOSIS — Z87891 Personal history of nicotine dependence: Secondary | ICD-10-CM | POA: Diagnosis not present

## 2023-09-17 DIAGNOSIS — I2510 Atherosclerotic heart disease of native coronary artery without angina pectoris: Secondary | ICD-10-CM | POA: Diagnosis not present

## 2023-09-17 DIAGNOSIS — Z8673 Personal history of transient ischemic attack (TIA), and cerebral infarction without residual deficits: Secondary | ICD-10-CM | POA: Diagnosis not present

## 2023-09-17 DIAGNOSIS — G40909 Epilepsy, unspecified, not intractable, without status epilepticus: Secondary | ICD-10-CM | POA: Insufficient documentation

## 2023-09-17 LAB — BASIC METABOLIC PANEL WITH GFR
Anion gap: 14 (ref 5–15)
BUN: 50 mg/dL — ABNORMAL HIGH (ref 8–23)
CO2: 21 mmol/L — ABNORMAL LOW (ref 22–32)
Calcium: 9.2 mg/dL (ref 8.9–10.3)
Chloride: 103 mmol/L (ref 98–111)
Creatinine, Ser: 1.87 mg/dL — ABNORMAL HIGH (ref 0.44–1.00)
GFR, Estimated: 28 mL/min — ABNORMAL LOW (ref >60)
Glucose, Bld: 84 mg/dL (ref 70–99)
Potassium: 4.1 mmol/L (ref 3.5–5.1)
Sodium: 138 mmol/L (ref 135–145)

## 2023-09-17 MED ORDER — SACUBITRIL-VALSARTAN 49-51 MG PO TABS: 1.0000 | ORAL_TABLET | Freq: Two times a day (BID) | ORAL | 11 refills | Status: AC

## 2023-09-17 NOTE — Progress Notes (Signed)
 ADVANCED HF CLINIC NOTE   PCP: Sherlynn Madden, MD HF Cardiology: Dr. Rolan  73 y.o. with history of CVA, chronic systolic CHF, CHB, and CAD returns for followup of CHF.   She had a stroke in 2009, thought to be cardioembolic.  She has had right hemiparesis and aphasia since that time.   Ms. [name] was hospitalized in August 2020 with a seizure after being out of her medications for 4 days.  She required intubation. High-sensitivity troponin trended to 1905. An echocardiogram 08/2018 revealed EF of 35 to 40% with global hypokinesis and moderate LVH, elevated LVEDP. She also had moderate AR. Nuclear stress test in August 2020 showed a large severe fixed inferior, anterior apical, and inferolateral perfusion defect suggestive of scar without ischemia.  Study was high risk due to EF estimated at 34% but mentions no significant reversible ischemia. She was deemed not a good candidate for cardiac catheterization during that hospitalization.    Patient was admitted 3/22 following a syncopal episode and was found to be in complete heart block.  This improved initially after stopping nodal blockers.  Echo in 3/22 showed EF 35-40%.  LHC showed significant left main and LCx disease.  She was deemed to be a poor candidate for PCI.  In 4/22, she had CABG with LIMA-LAD and SVG-OM2.  Post-op, she developed CHB again.  St Jude CRT-P device was placed. She was discharged to a rehab facility.   Echo in 9/22 showed EF 35% with basal to mid inferior and inferolateral AK, mildly decreased RV function, mild-moderate MR.    Echo 12/23 EF 25-30%, RV mod reduced.  Echo 4/25 EF 20-25% with mild RV dysfunction and mild MR.   Today she returns for HF follow up with her husband, who helps provide most of her history due to her expressive aphasia. Overall feeling fine. Husband notices increased fatigue in [place] after Toprol  was increased to 50 mg daily so he backed down to 25 mg daily and symptoms resolved. She is  able to walk around her home with her quad cane and he helps with ADLs, without dyspnea. Husband manages her mediations. No exertional chest pain. Denies palpitations, abnormal bleeding, dizziness, edema, or PND/Orthopnea. Appetite ok. Taking all medications. She was able to dance with her husband at a recent family reunion.  St Jude device interrogation (personally reviewed): Corvue stable   Labs (5/24): K 4.7, creatinine 1.25, A1c 6.1, LDL 76, LFTs normal Labs (89/75): 4.6, creatinine 1.25, BNP 558.1  Labs (12/24): K 4.7, creatinine 1.21 Labs (1/25): BNP 358, K 4.3, creatinine 1.18 Labs (5/25): K 4.2, creatinine 1.39 Labs (6/25): LDL 51  PMH: 1. H/o seizure disorder.  2. CVA: 2009 with aphasia and right hemiparesis.  3. Complete heart block: St Jude CRT-P. 4. Chronic systolic CHF: Ischemic cardiomyopathy. St Jude CRT-P device.  - Echo (3/22): EF 35-40%, inferior and lateral HK, normal RV.  - Coronary angiography (4/22): 60-70% dLM, 60-70% ostial LCx, 99% proximal calcified LCx (LCx was large, dominant vessel).  - Echo (9/22): EF 35% with basal to mid inferior and inferolateral AK, mildly decreased RV function, mild-moderate MR. - Echo (4/25): EF 20-25% with mild RV dysfunction and mild MR.  5. CAD:  Coronary angiography (4/22) with 60-70% dLM, 60-70% ostial LCx, 99% proximal calcified LCx (LCx was large, dominant vessel). - CABG x 4 in 4/22: LIMA-LAD, SVG-OM2.  6. CHB: St Jude CRT-P placed in 4/22.   Social History   Socioeconomic History   Marital status: Married  Spouse name: [name]   Number of children: 2   Years of education: 12   Highest education level: Not on file  Occupational History    Employer: RETIRED  Tobacco Use   Smoking status: Former    Current packs/day: 0.00    Types: Cigarettes    Quit date: 09/26/2007    Years since quitting: 15.9   Smokeless tobacco: Never  Vaping Use   Vaping status: Never Used  Substance and Sexual Activity   Alcohol use: Not  Currently    Comment: Former drinker, 15 years ago   Drug use: Never   Sexual activity: Not Currently  Other Topics Concern   Not on file  Social History Narrative   Tobacco use, amount per day now: None.   Past tobacco use, amount per day: Everyday.   How many years did you use tobacco: 30 years.   Alcohol use (drinks per week): Wine Daily   Diet:   Do you drink/eat things with caffeine: Yes   Marital status: Married                                  What year were you married?    Do you live in a house, apartment, assisted living, condo, trailer, etc.? House   Is it one or more stories? 1 story.   How many persons live in your home? 2   Do you have pets in your home?( please list) No   Highest Level of education completed? Associate   Current or past profession: Director    Do you exercise?    No                             Type and how often?   Do you have a living will?    Do you have a DNR form?                                   If not, do you want to discuss one?   Do you have signed POA/HPOA forms?                        If so, please bring to you appointment      Do you have any difficulty bathing or dressing yourself? Yes   Do you have any difficulty preparing food or eating? Yes   Do you have any difficulty managing your medications? Yes   Do you have any difficulty managing your finances? Yes   Do you have any difficulty affording your medications? Yes   Social Drivers of Corporate investment banker Strain: Not on file  Food Insecurity: No Food Insecurity (10/20/2021)   Hunger Vital Sign    Worried About Running Out of Food in the Last Year: Never true    Ran Out of Food in the Last Year: Never true  Transportation Needs: No Transportation Needs (10/20/2021)   PRAPARE - Administrator, Civil Service (Medical): No    Lack of Transportation (Non-Medical): No  Physical Activity: Not on file  Stress: Not on file  Social Connections: Not on file  Intimate  Partner Violence: Not on file   Family History  Problem Relation Age of Onset   Diabetes Daughter  High Cholesterol Daughter    Hypertension Daughter    Heart disease Daughter    Hypertension Sister    High Cholesterol Sister    Asthma Brother    Bronchitis Brother    Colon cancer Maternal Grandfather    ROS: All systems reviewed and negative except as per HPI.   Current Outpatient Medications  Medication Sig Dispense Refill   acetaminophen  (TYLENOL ) 325 MG tablet Take 650 mg by mouth 2 (two) times a week. As needed (Patient taking differently: Take 650 mg by mouth 2 (two) times a week. As needed)     atorvastatin  (LIPITOR ) 80 MG tablet TAKE 1 TABLET(80 MG) BY MOUTH AT BEDTIME 90 tablet 3   Cholecalciferol 50 MCG (2000 UT) TABS Take 1 tablet by mouth daily in the afternoon.     Ensure (ENSURE) Take 237 mLs by mouth daily.     ezetimibe  (ZETIA ) 10 MG tablet Take 1 tablet (10 mg total) by mouth daily. 90 tablet 3   furosemide  (LASIX ) 20 MG tablet Take 2 tablets (40 mg total) by mouth daily. 180 tablet 3   JARDIANCE  10 MG TABS tablet TAKE 1 TABLET(10 MG) BY MOUTH DAILY 30 tablet 11   Lacosamide  (VIMPAT ) 100 MG TABS Take 1 tablet (100 mg total) by mouth 2 (two) times daily. 180 tablet 1   levETIRAcetam  (KEPPRA ) 750 MG tablet Take 2 tablets (1,500 mg total) by mouth 2 (two) times daily. 360 tablet 3   metoprolol  succinate (TOPROL -XL) 50 MG 24 hr tablet Take 0.5 tablets (25 mg total) by mouth daily. Take with or immediately following a meal.     nystatin  cream (MYCOSTATIN ) Apply 1 Application topically as needed for dry skin. 30 g 1   sacubitril -valsartan  (ENTRESTO ) 97-103 MG Take 1 tablet by mouth 2 (two) times daily. 60 tablet 11   spironolactone  (ALDACTONE ) 25 MG tablet TAKE 1 TABLET(25 MG) BY MOUTH DAILY 30 tablet 11   warfarin (COUMADIN ) 5 MG tablet TAKE 1 TABLET(5 MG) BY MOUTH DAILY 30 tablet 0   No current facility-administered medications for this encounter.   Wt Readings from  Last 3 Encounters:  09/17/23 59 kg (130 lb)  08/09/23 61.7 kg (136 lb)  08/02/23 63 kg (139 lb)   BP (!) 90/54   Pulse 73   Ht 5' 7 (1.702 m)   Wt 59 kg (130 lb)   SpO2 97%   BMI 20.36 kg/m   PHYSICAL EXAM: General:  NAD. No resp difficulty, arrived in [facility] HEENT: Normal Neck: Supple. No JVD. Cor: Regular rate & rhythm. No rubs, gallops or murmurs. Lungs: Clear Abdomen: Soft, nontender, nondistended.  Extremities: No cyanosis, clubbing, rash, edema Neuro: Alert & oriented x 3, R hemiparesis, + expressive aphasia  Asssessment/Plan: 1. Complete heart block: Patient had baseline NSR with LAFB/RBBB, so there was underlying conduction system disease.  She was transiently in CHB pre-CABG and developed persistent CHB post-CABG.  She now has Secondary school teacher CRT-P device.   - followed by EP clinic 2. Chronic systolic CHF: Ischemic cardiomyopathy.  Echo in 8/20 with EF 35-40% and wall motion abnormalities. LHC in 4/22 showed left main/severe codominant LCx disease.  Echo in 3/22 with EF 35-40%, inferior and lateral hypokinesis.  Echo in 9/22 with EF 35%, mild RV dysfunction.  Now s/p CABG.  Echo 12/23 EF 25-30%, RV moderately reduced. Echo 4/25 with EF 20-25% with mild RV dysfunction and mild MR.  NYHA class II, confounded by mobility issues from CVA. She is not volume overloaded by  exam or Corvue. - With low BP, decrease Entresto  to 49/51 mg bid. BMET today. - Continue Lasix   40 mg daily. BMET today. - Continue Toprol  XL 25 mg daily. Fatigued on higher doses. - Continue spironolactone  25 mg daily.    - Continue Jardiance  10 mg daily. 3. 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 Has residual long-standing aphasia and right hemiparesis.   - Continue warfarin. INR followed by coumadin  clinic. We looked into transitioning to Eliquis  but she cannot afford Eliquis .  4. H/o seizure disorder:  - Continue Keppra .  - Followed by neurology  5. CAD:  LHC showed 60-70% distal left main stenosis, heavily  calcified proximal LCx with 60-70% stenosis at the ostium and discrete 99% stenosis in the proximal LCx.  S/p CABG x 2 4/22 w/ LIMA-LAD, SVG-OM2.  No exertional chest pain. - no ASA given Coumadin   - Continue atorvastatin  80 mg daily.  Doing well! Follow up in 6 months with Dr. Rolan Raisin [facility] FNP-BC 09/17/2023

## 2023-09-17 NOTE — Patient Instructions (Addendum)
 Thank you for coming in today  If you had labs drawn today, any labs that are abnormal the clinic will call you No news is good news  Medications: Decrease Entresto  49/51 mg 1 tablet twice daily  Follow up appointments:  Your physician recommends that you schedule a follow-up appointment in:  6 month With Dr. Rolan Please call our office to schedule the follow-up appointment in December 2025 for February 2026.    Do the following things EVERYDAY: Weigh yourself in the morning before breakfast. Write it down and keep it in a log. Take your medicines as prescribed Eat low salt foods--Limit salt (sodium) to 2000 mg per day.  Stay as active as you can everyday Limit all fluids for the day to less than 2 liters   At the [facility], you and your health needs are our priority. As part of our continuing mission to provide you with exceptional heart care, we have created designated Provider Care Teams. These Care Teams include your primary Cardiologist (physician) and Advanced Practice Providers (APPs- Physician Assistants and Nurse Practitioners) who all work together to provide you with the care you need, when you need it.   You may see any of the following providers on your designated Care Team at your next follow up: Dr Toribio Fuel Dr Ezra Rolan Dr. Ria Gardenia Greig Lenetta, NP Caffie Shed, [place] [facility] [place], [place] Beckey Coe, NP Tinnie Redman, PharmD   Please be sure to bring in all your medications bottles to every appointment.    Thank you for choosing [facility]  If you have any questions or concerns before your next appointment please send us  a message through [place] or call our office at [phone].    TO LEAVE A MESSAGE FOR THE NURSE SELECT OPTION 2, PLEASE LEAVE A MESSAGE INCLUDING: YOUR NAME DATE OF BIRTH CALL BACK NUMBER REASON FOR CALL**this is important as we prioritize  the call backs  YOU WILL RECEIVE A CALL BACK THE SAME DAY AS LONG AS YOU CALL BEFORE 4:00 PM

## 2023-09-18 MED ORDER — FUROSEMIDE 20 MG PO TABS: 40.0000 mg | ORAL_TABLET | Freq: Every day | ORAL | Status: DC | PRN

## 2023-09-22 ENCOUNTER — Other Ambulatory Visit: Payer: Self-pay | Admitting: Sports Medicine

## 2023-09-22 DIAGNOSIS — Z7901 Long term (current) use of anticoagulants: Secondary | ICD-10-CM

## 2023-09-25 ENCOUNTER — Ambulatory Visit (INDEPENDENT_AMBULATORY_CARE_PROVIDER_SITE_OTHER): Admitting: Sports Medicine

## 2023-09-25 ENCOUNTER — Encounter: Payer: Self-pay | Admitting: Sports Medicine

## 2023-09-25 VITALS — BP 104/70 | HR 68 | Temp 97.3°F | Ht 67.0 in

## 2023-09-25 DIAGNOSIS — Z7901 Long term (current) use of anticoagulants: Secondary | ICD-10-CM

## 2023-09-25 DIAGNOSIS — Z23 Encounter for immunization: Secondary | ICD-10-CM | POA: Diagnosis not present

## 2023-09-25 LAB — POCT INR: POC INR: 1.7

## 2023-09-25 NOTE — Patient Instructions (Signed)
Take coumadin 5 mg daily

## 2023-09-25 NOTE — Progress Notes (Signed)
 Careteam: Patient Care Team: Sherlynn Madden, MD as PCP - General (Internal Medicine) Inocencio Soyla Lunger, MD as PCP - Electrophysiology (Cardiology) Jeffrie Oneil BROCKS, MD as PCP - Cardiology (Cardiology) Ladona Heinz, MD as Attending Physician (Cardiology) Shepard Ade, MD (Internal Medicine) Raford Riggs, MD as Attending Physician (Cardiology) [name], Eduard SAUNDERS, MD as Consulting Physician (Neurology) Morgan, [name] CROME, RN as [facility] Care Management  PLACE OF SERVICE:  [facility]  Advanced Directive information    Allergies  Allergen Reactions   Lexiscan  [Regadenoson ] Other (See Comments)    Perioral tingling and throat tightness    Chief Complaint  Patient presents with   Medical Management of Chronic Issues    2 week follow up // INR      Discussed the use of AI scribe software for clinical note transcription with the patient, who gave verbal consent to proceed.  History of Present Illness [name] is a 73 year old female who presents for INR monitoring and Coumadin  management.  She is on a regimen of 5 mg of Coumadin  daily except on Sundays. Her INR today is 1.7.  She received a flu shot during this visit. No coughing, choking while eating, changes in breathing, fevers, or chills. She uses pull-ups and has not experienced any blood in her urine or stool, nor any pain or burning sensation during urination.  At home, she uses a walker for mobility and requires assistance with dressing and changing. Her caregiver is exploring options for a fully electric lift chair to aid in her mobility.  She has an upcoming appointment with her cardiologist.   Pt is accompanied by her husband Has no concerns today Pt ambulates with walker at home Denies no signs of bleeding INR 1.7  Current dosing 5 mg daily except Sunday   Review of Systems:  Review of Systems  Constitutional:  Negative for chills and fever.  HENT:  Negative for  congestion and sore throat.   Respiratory:  Negative for cough, sputum production and shortness of breath.   Cardiovascular:  Negative for chest pain, palpitations and leg swelling.  Gastrointestinal:  Negative for abdominal pain, heartburn and nausea.  Genitourinary:  Negative for dysuria, frequency and hematuria.  Musculoskeletal:  Negative for falls and myalgias.  Neurological:  Negative for dizziness.   Negative unless indicated in HPI.   Past Medical History:  Diagnosis Date   Adenomatous polyp of colon 09/2012   repeat colonoscopy in 5 years by Dr. Jakie   CHF (congestive heart failure) (HCC)    EF 15-20% as of 05/28/11 (Dr Ozell Rigby-Hospital D/C summary)   CVA (cerebral infarction) 12/2007   H/O heart bypass surgery 2022   Hemiparesis (HCC)    Hyperlipidemia    Hypertension    Seizures (HCC)    Sickle cell anemia (HCC)    Stroke (HCC)    Vitamin D  deficiency 2010   Past Surgical History:  Procedure Laterality Date   ABDOMINAL HYSTERECTOMY     BIV PACEMAKER INSERTION CRT-P N/A 04/29/2020   Procedure: BIV PACEMAKER INSERTION CRT-P;  Surgeon: Inocencio Soyla Lunger, MD;  Location: [facility] INVASIVE CV LAB;  Service: Cardiovascular;  Laterality: N/A;   CORONARY ARTERY BYPASS GRAFT N/A 04/25/2020   Procedure: CORONARY ARTERY BYPASS GRAFTING (CABG) TIMES TWO USING LEFT INTERNAL MAMMARY ARTERY AND RIGHT GREATER SAPHENOUS VEIN HARVESTED ENDOSCOPICALLY;  Surgeon: Kerrin Elspeth BROCKS, MD;  Location: [facility] OR;  Service: Open Heart Surgery;  Laterality: N/A;   FRACTURE SURGERY  HIP SURGERY     LEFT HEART CATH AND CORONARY ANGIOGRAPHY N/A 04/22/2020   Procedure: LEFT HEART CATH AND CORONARY ANGIOGRAPHY;  Surgeon: Rolan Ezra RAMAN, MD;  Location: [facility] INVASIVE CV LAB;  Service: Cardiovascular;  Laterality: N/A;   TEE WITHOUT CARDIOVERSION  04/25/2020   Procedure: TRANSESOPHAGEAL ECHOCARDIOGRAM (TEE);  Surgeon: Kerrin Elspeth BROCKS, MD;  Location: [facility] OR;  Service: Open Heart Surgery;;    TEMPORARY PACEMAKER N/A 04/21/2020   Procedure: TEMPORARY PACEMAKER;  Surgeon: Rolan Ezra RAMAN, MD;  Location: [facility] INVASIVE CV LAB;  Service: Cardiovascular;  Laterality: N/A;   TUBAL LIGATION     Social History:   reports that she quit smoking about 16 years ago. Her smoking use included cigarettes. She has never used smokeless tobacco. She reports that she does not currently use alcohol. She reports that she does not use drugs.  Family History  Problem Relation Age of Onset   Diabetes Daughter    High Cholesterol Daughter    Hypertension Daughter    Heart disease Daughter    Hypertension Sister    High Cholesterol Sister    Asthma Brother    Bronchitis Brother    Colon cancer Maternal Grandfather     Medications: Patient's Medications  New Prescriptions   No medications on file  Previous Medications   ACETAMINOPHEN  (TYLENOL ) 325 MG TABLET    Take 650 mg by mouth 2 (two) times a week. As needed   ATORVASTATIN  (LIPITOR ) 80 MG TABLET    TAKE 1 TABLET(80 MG) BY MOUTH AT BEDTIME   CHOLECALCIFEROL 50 MCG (2000 UT) TABS    Take 1 tablet by mouth daily in the afternoon.   ENSURE (ENSURE)    Take 237 mLs by mouth daily.   EZETIMIBE  (ZETIA ) 10 MG TABLET    Take 1 tablet (10 mg total) by mouth daily.   FUROSEMIDE  (LASIX ) 20 MG TABLET    Take 2 tablets (40 mg total) by mouth daily as needed for fluid or edema. For swelling, or weight gain of 3 pounds overnight or 5 pounds in 1 week.   JARDIANCE  10 MG TABS TABLET    TAKE 1 TABLET(10 MG) BY MOUTH DAILY   LACOSAMIDE  (VIMPAT ) 100 MG TABS    Take 1 tablet (100 mg total) by mouth 2 (two) times daily.   LEVETIRACETAM  (KEPPRA ) 750 MG TABLET    Take 2 tablets (1,500 mg total) by mouth 2 (two) times daily.   METOPROLOL  SUCCINATE (TOPROL -XL) 50 MG 24 HR TABLET    Take 0.5 tablets (25 mg total) by mouth daily. Take with or immediately following a meal.   NYSTATIN  CREAM (MYCOSTATIN )    Apply 1 Application topically as needed for dry skin.    SACUBITRIL -VALSARTAN  (ENTRESTO ) 49-51 MG    Take 1 tablet by mouth 2 (two) times daily.   SPIRONOLACTONE  (ALDACTONE ) 25 MG TABLET    TAKE 1 TABLET(25 MG) BY MOUTH DAILY   WARFARIN (COUMADIN ) 5 MG TABLET    TAKE 1 TABLET(5 MG) BY MOUTH DAILY  Modified Medications   No medications on file  Discontinued Medications   No medications on file    Physical Exam: Vitals:   09/25/23 1004  BP: 104/70  Pulse: 68  Temp: (!) 97.3 F (36.3 C)  SpO2: 95%  Height: 5' 7 (1.702 m)   Body mass index is 20.36 kg/m. BP Readings from Last 3 Encounters:  09/25/23 104/70  09/17/23 (!) 90/54  09/10/23 90/64   Wt Readings from Last 3 Encounters:  09/17/23 130 lb (59 kg)  08/09/23 136 lb (61.7 kg)  08/02/23 139 lb (63 kg)    Physical Exam Constitutional:      Appearance: Normal appearance.  HENT:     Head: Normocephalic and atraumatic.  Cardiovascular:     Rate and Rhythm: Normal rate and regular rhythm.  Pulmonary:     Effort: Pulmonary effort is normal. No respiratory distress.     Breath sounds: Normal breath sounds. No wheezing.  Abdominal:     General: Bowel sounds are normal. There is no distension.     Tenderness: There is no abdominal tenderness. There is no guarding or rebound.     Comments:    Musculoskeletal:        General: No swelling.  Neurological:     Mental Status: She is alert. Mental status is at baseline.     Motor: No weakness.     Comments: Residual weakness on Rt upper extremity       Labs reviewed: Basic Metabolic Panel: Recent Labs    01/25/23 1000 05/16/23 1009 09/17/23 1107  NA 137 137 138  K 4.3 4.2 4.1  CL 105 104 103  CO2 21* 20* 21*  GLUCOSE 95 77 84  BUN 28* 25* 50*  CREATININE 1.18* 1.39* 1.87*  CALCIUM  9.0 9.1 9.2   Liver Function Tests: No results for input(s): AST, ALT, ALKPHOS, BILITOT, PROT, ALBUMIN  in the last 8760 hours. No results for input(s): LIPASE, AMYLASE in the last 8760 hours. No results for input(s):  AMMONIA in the last 8760 hours. CBC: Recent Labs    10/03/22 1010  WBC 5.9  NEUTROABS 3,463  HGB 12.5  HCT 38.6  MCV 88.1  PLT 304   Lipid Panel: Recent Labs    05/16/23 1009 07/16/23 1004  CHOL 125 104  HDL 41 40*  LDLCALC 68 51  TRIG 80 65  CHOLHDL 3.0 2.6   TSH: No results for input(s): TSH in the last 8760 hours. A1C: Lab Results  Component Value Date   HGBA1C 6.1 (H) 06/06/2022    Assessment and Plan Assessment & Plan   1. Anticoagulation monitoring, INR range 2.5-3.5 (Primary) INR 1.7  Will change to coumadin  5 mg daily  Follow up in 4 weeks  2. Flu vaccine need  - Flu vaccine HIGH DOSE PF(Fluzone Trivalent)

## 2023-09-26 ENCOUNTER — Ambulatory Visit (HOSPITAL_COMMUNITY)
Admission: RE | Admit: 2023-09-26 | Discharge: 2023-09-26 | Disposition: A | Discharge status: Home / Self Care | Source: Ambulatory Visit | Attending: Cardiology | Admitting: Cardiology

## 2023-09-26 DIAGNOSIS — I5022 Chronic systolic (congestive) heart failure: Secondary | ICD-10-CM | POA: Diagnosis not present

## 2023-09-26 LAB — BASIC METABOLIC PANEL WITH GFR
Anion gap: 12 (ref 5–15)
BUN: 14 mg/dL (ref 8–23)
CO2: 21 mmol/L — ABNORMAL LOW (ref 22–32)
Calcium: 9 mg/dL (ref 8.9–10.3)
Chloride: 107 mmol/L (ref 98–111)
Creatinine, Ser: 1.26 mg/dL — ABNORMAL HIGH (ref 0.44–1.00)
GFR, Estimated: 45 mL/min — ABNORMAL LOW (ref >60)
Glucose, Bld: 98 mg/dL (ref 70–99)
Potassium: 4.4 mmol/L (ref 3.5–5.1)
Sodium: 140 mmol/L (ref 135–145)

## 2023-09-27 ENCOUNTER — Ambulatory Visit (HOSPITAL_COMMUNITY): Payer: Self-pay | Admitting: Family Medicine

## 2023-10-23 ENCOUNTER — Encounter: Payer: Self-pay | Admitting: Sports Medicine

## 2023-10-23 ENCOUNTER — Ambulatory Visit (INDEPENDENT_AMBULATORY_CARE_PROVIDER_SITE_OTHER): Admitting: Sports Medicine

## 2023-10-23 VITALS — BP 128/72 | HR 72 | Temp 97.5°F | Ht 67.0 in

## 2023-10-23 DIAGNOSIS — Z7901 Long term (current) use of anticoagulants: Secondary | ICD-10-CM

## 2023-10-23 DIAGNOSIS — M24541 Contracture, right hand: Secondary | ICD-10-CM

## 2023-10-23 LAB — POCT INR: POC INR: 3.2

## 2023-10-23 NOTE — Progress Notes (Signed)
 Careteam: Patient Care Team: Sherlynn Madden, MD as PCP - General (Internal Medicine) Inocencio Soyla Lunger, MD as PCP - Electrophysiology (Cardiology) Jeffrie Oneil BROCKS, MD as PCP - Cardiology (Cardiology) Ladona Heinz, MD as Attending Physician (Cardiology) Shepard Ade, MD (Internal Medicine) Raford Riggs, MD as Attending Physician (Cardiology) [name], Eduard SAUNDERS, MD as Consulting Physician (Neurology) Morgan, [name] CROME, RN as [facility] Care Management  PLACE OF SERVICE:  [facility] CL[facility]  Advanced Directive information    Allergies  Allergen Reactions   Lexiscan  Esmeralda.Fears ] Other (See Comments)    Perioral tingling and throat tightness    Chief Complaint  Patient presents with   Medical Management of Chronic Issues    4 week follow up  INR  Patients husband declined COVID and Shingles. Patient has not had a mammogram this year.  No blood clotting and no missed doses.      Discussed the use of AI scribe software for clinical note transcription with the patient, who gave verbal consent to proceed.  History of Present Illness  [name] is a 73 year old female  who presents with elevated INR and hand pain.  Her INR is elevated at 3.2, compared to a previous level of 1.9. She is currently taking a dose of 5 mg daily. Husband states that she started entresto  about a week ago. No blood in stool or dark-colored stools.  She experiences pain in her hand, which she describes as 'hurting' and 'painful.' Her fingers are curling and not straightening. She has been wearing something on her hand that might be too tight, causing discomfort. The pain in her hand persists.  She also mentions pain in her left knee and side, as well as a blackhead on her back causing discomfort.  No cough, congestion, or recent falls.    Review of Systems:  Review of Systems  Constitutional:  Negative for chills and fever.  HENT:  Negative for sore throat.    Respiratory:  Negative for cough and shortness of breath.   Cardiovascular:  Negative for chest pain.  Gastrointestinal:  Negative for abdominal pain, heartburn and nausea.  Genitourinary:  Negative for dysuria and hematuria.  Musculoskeletal:  Negative for falls and myalgias.  Neurological:  Negative for dizziness.   Negative unless indicated in HPI.   Past Medical History:  Diagnosis Date   Adenomatous polyp of colon 09/2012   repeat colonoscopy in 5 years by Dr. Jakie   CHF (congestive heart failure) (HCC)    EF 15-20% as of 05/28/11 (Dr Ozell Rigby-Hospital D/C summary)   CVA (cerebral infarction) 12/2007   H/O heart bypass surgery 2022   Hemiparesis (HCC)    Hyperlipidemia    Hypertension    Seizures (HCC)    Sickle cell anemia (HCC)    Stroke (HCC)    Vitamin D  deficiency 2010   Past Surgical History:  Procedure Laterality Date   ABDOMINAL HYSTERECTOMY     BIV PACEMAKER INSERTION CRT-P N/A 04/29/2020   Procedure: BIV PACEMAKER INSERTION CRT-P;  Surgeon: Inocencio Soyla Lunger, MD;  Location: [facility] INVASIVE CV LAB;  Service: Cardiovascular;  Laterality: N/A;   CORONARY ARTERY BYPASS GRAFT N/A 04/25/2020   Procedure: CORONARY ARTERY BYPASS GRAFTING (CABG) TIMES TWO USING LEFT INTERNAL MAMMARY ARTERY AND RIGHT GREATER SAPHENOUS VEIN HARVESTED ENDOSCOPICALLY;  Surgeon: Kerrin Elspeth BROCKS, MD;  Location: [facility] OR;  Service: Open Heart Surgery;  Laterality: N/A;   FRACTURE SURGERY     HIP SURGERY     LEFT HEART  CATH AND CORONARY ANGIOGRAPHY N/A 04/22/2020   Procedure: LEFT HEART CATH AND CORONARY ANGIOGRAPHY;  Surgeon: Rolan Ezra RAMAN, MD;  Location: [facility] INVASIVE CV LAB;  Service: Cardiovascular;  Laterality: N/A;   TEE WITHOUT CARDIOVERSION  04/25/2020   Procedure: TRANSESOPHAGEAL ECHOCARDIOGRAM (TEE);  Surgeon: Kerrin Elspeth BROCKS, MD;  Location: [facility] OR;  Service: Open Heart Surgery;;   TEMPORARY PACEMAKER N/A 04/21/2020   Procedure: TEMPORARY PACEMAKER;  Surgeon: Rolan Ezra RAMAN,  MD;  Location: [facility] INVASIVE CV LAB;  Service: Cardiovascular;  Laterality: N/A;   TUBAL LIGATION     Social History:   reports that she quit smoking about 16 years ago. Her smoking use included cigarettes. She has never used smokeless tobacco. She reports that she does not currently use alcohol. She reports that she does not use drugs.  Family History  Problem Relation Age of Onset   Diabetes Daughter    High Cholesterol Daughter    Hypertension Daughter    Heart disease Daughter    Hypertension Sister    High Cholesterol Sister    Asthma Brother    Bronchitis Brother    Colon cancer Maternal Grandfather     Medications: Patient's Medications  New Prescriptions   No medications on file  Previous Medications   ACETAMINOPHEN  (TYLENOL ) 325 MG TABLET    Take 650 mg by mouth 2 (two) times a week. As needed   ATORVASTATIN  (LIPITOR ) 80 MG TABLET    TAKE 1 TABLET(80 MG) BY MOUTH AT BEDTIME   CHOLECALCIFEROL 50 MCG (2000 UT) TABS    Take 1 tablet by mouth daily in the afternoon.   ENSURE (ENSURE)    Take 237 mLs by mouth daily.   EZETIMIBE  (ZETIA ) 10 MG TABLET    Take 1 tablet (10 mg total) by mouth daily.   FUROSEMIDE  (LASIX ) 20 MG TABLET    Take 2 tablets (40 mg total) by mouth daily as needed for fluid or edema. For swelling, or weight gain of 3 pounds overnight or 5 pounds in 1 week.   JARDIANCE  10 MG TABS TABLET    TAKE 1 TABLET(10 MG) BY MOUTH DAILY   LACOSAMIDE  (VIMPAT ) 100 MG TABS    Take 1 tablet (100 mg total) by mouth 2 (two) times daily.   LEVETIRACETAM  (KEPPRA ) 750 MG TABLET    Take 2 tablets (1,500 mg total) by mouth 2 (two) times daily.   METOPROLOL  SUCCINATE (TOPROL -XL) 50 MG 24 HR TABLET    Take 0.5 tablets (25 mg total) by mouth daily. Take with or immediately following a meal.   NYSTATIN  CREAM (MYCOSTATIN )    Apply 1 Application topically as needed for dry skin.   SACUBITRIL -VALSARTAN  (ENTRESTO ) 49-51 MG    Take 1 tablet by mouth 2 (two) times daily.   SPIRONOLACTONE   (ALDACTONE ) 25 MG TABLET    TAKE 1 TABLET(25 MG) BY MOUTH DAILY   WARFARIN (COUMADIN ) 5 MG TABLET    TAKE 1 TABLET(5 MG) BY MOUTH DAILY  Modified Medications   No medications on file  Discontinued Medications   No medications on file    Physical Exam: Vitals:   10/23/23 0935  BP: 128/72  Pulse: 72  Temp: (!) 97.5 F (36.4 C)  SpO2: 99%  Height: 5' 7 (1.702 m)   Body mass index is 20.36 kg/m. BP Readings from Last 3 Encounters:  10/23/23 128/72  09/25/23 104/70  09/17/23 (!) 90/54   Wt Readings from Last 3 Encounters:  09/17/23 130 lb (59 kg)  08/09/23 136  lb (61.7 kg)  08/02/23 139 lb (63 kg)    Physical Exam Constitutional:      Appearance: Normal appearance.  HENT:     Head: Normocephalic and atraumatic.  Cardiovascular:     Rate and Rhythm: Normal rate and regular rhythm.  Pulmonary:     Effort: Pulmonary effort is normal. No respiratory distress.     Breath sounds: Normal breath sounds. No wheezing.  Abdominal:     General: Bowel sounds are normal. There is no distension.     Tenderness: There is no abdominal tenderness. There is no guarding or rebound.     Comments:    Musculoskeletal:        General: No swelling.  Skin:    General: Skin is dry.  Neurological:     Mental Status: She is alert. Mental status is at baseline.     Motor: No weakness.     Comments: Residual weakness on Rt side Rt hand contractures     Labs reviewed: Basic Metabolic Panel: Recent Labs    05/16/23 1009 09/17/23 1107 09/26/23 0909  NA 137 138 140  K 4.2 4.1 4.4  CL 104 103 107  CO2 20* 21* 21*  GLUCOSE 77 84 98  BUN 25* 50* 14  CREATININE 1.39* 1.87* 1.26*  CALCIUM  9.1 9.2 9.0   Liver Function Tests: No results for input(s): AST, ALT, ALKPHOS, BILITOT, PROT, ALBUMIN  in the last 8760 hours. No results for input(s): LIPASE, AMYLASE in the last 8760 hours. No results for input(s): AMMONIA in the last 8760 hours. CBC: No results for input(s):  WBC, NEUTROABS, HGB, HCT, MCV, PLT in the last 8760 hours. Lipid Panel: Recent Labs    05/16/23 1009 07/16/23 1004  CHOL 125 104  HDL 41 40*  LDLCALC 68 51  TRIG 80 65  CHOLHDL 3.0 2.6   TSH: No results for input(s): TSH in the last 8760 hours. A1C: Lab Results  Component Value Date   HGBA1C 6.1 (H) 06/06/2022    Assessment and Plan Assessment & Plan  1. Anticoagulation monitoring, INR range 2.5-3.5 (Primary) INR 3.2 Instructed to take 2.5 mg today instead of 5 mg  New dosing 2.5mg  wed and 5 mg remaining days Monitor for signs of bleeding   2. Contracture of right hand Rt hand contractures from previous stroke.

## 2023-10-23 NOTE — Patient Instructions (Signed)
 Take 2.5 mg today and resume with 5 mg from tomorrow  New dosing 2.5 mg wed remaining days 5 mg dose

## 2023-10-28 ENCOUNTER — Ambulatory Visit: Payer: Medicare HMO

## 2023-10-28 DIAGNOSIS — I5022 Chronic systolic (congestive) heart failure: Secondary | ICD-10-CM | POA: Diagnosis not present

## 2023-10-29 ENCOUNTER — Other Ambulatory Visit: Payer: Self-pay | Admitting: Sports Medicine

## 2023-10-29 DIAGNOSIS — Z7901 Long term (current) use of anticoagulants: Secondary | ICD-10-CM

## 2023-10-30 LAB — CUP PACEART REMOTE DEVICE CHECK
Battery Remaining Longevity: 48 mo
Battery Remaining Percentage: 51 %
Battery Voltage: 2.98 V
Brady Statistic AP VP Percent: 9.4 %
Brady Statistic AP VS Percent: 1 %
Brady Statistic AS VP Percent: 90 %
Brady Statistic AS VS Percent: 1 %
Brady Statistic RA Percent Paced: 8.7 %
Date Time Interrogation Session: 20251006020010
Implantable Lead Connection Status: 753985
Implantable Lead Connection Status: 753985
Implantable Lead Connection Status: 753985
Implantable Lead Implant Date: 20220411
Implantable Lead Implant Date: 20220411
Implantable Lead Implant Date: 20220411
Implantable Lead Location: 753858
Implantable Lead Location: 753859
Implantable Lead Location: 753860
Implantable Pulse Generator Implant Date: 20220411
Lead Channel Impedance Value: 350 Ohm
Lead Channel Impedance Value: 410 Ohm
Lead Channel Impedance Value: 550 Ohm
Lead Channel Pacing Threshold Amplitude: 0.625 V
Lead Channel Pacing Threshold Amplitude: 0.75 V
Lead Channel Pacing Threshold Amplitude: 0.75 V
Lead Channel Pacing Threshold Pulse Width: 0.5 ms
Lead Channel Pacing Threshold Pulse Width: 0.5 ms
Lead Channel Pacing Threshold Pulse Width: 0.5 ms
Lead Channel Sensing Intrinsic Amplitude: 3.2 mV
Lead Channel Sensing Intrinsic Amplitude: 9.6 mV
Lead Channel Setting Pacing Amplitude: 2 V
Lead Channel Setting Pacing Amplitude: 2 V
Lead Channel Setting Pacing Amplitude: 2 V
Lead Channel Setting Pacing Pulse Width: 0.5 ms
Lead Channel Setting Pacing Pulse Width: 0.5 ms
Lead Channel Setting Sensing Sensitivity: 4 mV
Pulse Gen Model: 3562
Pulse Gen Serial Number: 6262141

## 2023-10-31 NOTE — Progress Notes (Signed)
 Remote PPM Transmission

## 2023-11-03 ENCOUNTER — Ambulatory Visit: Payer: Self-pay | Admitting: Cardiology

## 2023-11-04 NOTE — Progress Notes (Signed)
 Remote PPM Transmission

## 2023-11-20 ENCOUNTER — Ambulatory Visit: Admitting: Family

## 2023-11-21 ENCOUNTER — Ambulatory Visit (INDEPENDENT_AMBULATORY_CARE_PROVIDER_SITE_OTHER): Admitting: Family

## 2023-11-21 ENCOUNTER — Encounter: Payer: Self-pay | Admitting: Family

## 2023-11-21 VITALS — BP 122/72 | HR 68 | Temp 97.6°F | Resp 17 | Ht 67.0 in | Wt 132.2 lb

## 2023-11-21 DIAGNOSIS — Z8673 Personal history of transient ischemic attack (TIA), and cerebral infarction without residual deficits: Secondary | ICD-10-CM | POA: Diagnosis not present

## 2023-11-21 DIAGNOSIS — Z7901 Long term (current) use of anticoagulants: Secondary | ICD-10-CM

## 2023-11-21 LAB — POCT INR: INR: 1.8 — AB (ref 2.0–3.0)

## 2023-11-21 MED ORDER — WARFARIN SODIUM 4 MG PO TABS: ORAL_TABLET | ORAL | 0 refills | Status: DC

## 2023-11-21 MED ORDER — WARFARIN SODIUM 1 MG PO TABS: 0.5000 mg | ORAL_TABLET | Freq: Every day | ORAL | 0 refills | Status: DC

## 2023-11-21 NOTE — Progress Notes (Signed)
 Provider: Tawsha Terrero FNP-C  Sherlynn Madden, MD  Patient Care Team: Sherlynn Madden, MD as PCP - General (Internal Medicine) Inocencio Soyla Lunger, MD as PCP - Electrophysiology (Cardiology) Jeffrie Oneil BROCKS, MD as PCP - Cardiology (Cardiology) Ladona Heinz, MD as Attending Physician (Cardiology) Shepard Ade, MD (Internal Medicine) Raford Riggs, MD as Attending Physician (Cardiology) [name] Eduard SAUNDERS, MD as Consulting Physician (Neurology) Morgan [name] CROME, RN as [facility] Management  Extended Emergency Contact Information Primary Emergency Contact: Sharber,[name] Address: [address]          [place], [place] [address] [place] Home Phone: [phone] Mobile Phone: [phone] Relation: Spouse Secondary Emergency Contact: [name] Address: [address]          [place] [phone] [place] Home Phone: [phone] Relation: Daughter  Code Status: Full Code  Goals of care: Advanced Directive information    11/21/2023    1:35 PM  Advanced Directives  Does Patient Have a Medical Advance Directive? No  Would patient like information on creating a medical advance directive? No - Patient declined     Chief Complaint  Patient presents with   Anticoagulation    4 week PT/INR check     Discussed the use of AI scribe software for clinical note transcription with the patient, who gave verbal consent to proceed.  History of Present Illness   [name] is a 73 year old female who presents for INR monitoring and medication adjustment.  She is on anticoagulation therapy and has been monitoring her INR levels. Her last recorded INR was 3.2. She takes 2.5 mg of her medication on Wednesdays and 5 mg on the remaining days, with no missed doses reported. No changes in her diet.  No bleeding, including epistaxis, hematuria, or melena. No recent falls. She is able to transfer herself  from the chair to the bed using a cane and drives herself to appointments. No cough or abdominal pain.    Past Medical History:  Diagnosis Date   Adenomatous polyp of colon 09/2012   repeat colonoscopy in 5 years by Dr. Jakie   CHF (congestive heart failure) (HCC)    EF 15-20% as of 05/28/11 (Dr Ozell Rigby-Hospital D/C summary)   CVA (cerebral infarction) 12/2007   H/O heart bypass surgery 2022   Hemiparesis (HCC)    Hyperlipidemia    Hypertension    Seizures (HCC)    Sickle cell anemia (HCC)    Stroke (HCC)    Vitamin D  deficiency 2010   Past Surgical History:  Procedure Laterality Date   ABDOMINAL HYSTERECTOMY     BIV PACEMAKER INSERTION CRT-P N/A 04/29/2020   Procedure: BIV PACEMAKER INSERTION CRT-P;  Surgeon: Inocencio Soyla Lunger, MD;  Location: [facility] INVASIVE CV LAB;  Service: Cardiovascular;  Laterality: N/A;   CORONARY ARTERY BYPASS GRAFT N/A 04/25/2020   Procedure: CORONARY ARTERY BYPASS GRAFTING (CABG) TIMES TWO USING LEFT INTERNAL MAMMARY ARTERY AND RIGHT GREATER SAPHENOUS VEIN HARVESTED ENDOSCOPICALLY;  Surgeon: Kerrin Elspeth BROCKS, MD;  Location: [facility] OR;  Service: Open Heart Surgery;  Laterality: N/A;   FRACTURE SURGERY     HIP SURGERY     LEFT HEART CATH AND CORONARY ANGIOGRAPHY N/A 04/22/2020   Procedure: LEFT HEART CATH AND CORONARY ANGIOGRAPHY;  Surgeon: Rolan Ezra RAMAN, MD;  Location: [facility] INVASIVE CV LAB;  Service: Cardiovascular;  Laterality: N/A;   TEE WITHOUT CARDIOVERSION  04/25/2020   Procedure: TRANSESOPHAGEAL ECHOCARDIOGRAM (TEE);  Surgeon: Kerrin Elspeth  C, MD;  Location: [facility] OR;  Service: Open Heart Surgery;;   TEMPORARY PACEMAKER N/A 04/21/2020   Procedure: TEMPORARY PACEMAKER;  Surgeon: Rolan Ezra RAMAN, MD;  Location: [facility] INVASIVE CV LAB;  Service: Cardiovascular;  Laterality: N/A;   TUBAL LIGATION      Allergies  Allergen Reactions   Lexiscan  [Regadenoson ] Other (See Comments)    Perioral tingling and throat tightness    Outpatient Encounter  Medications as of 11/21/2023  Medication Sig   acetaminophen  (TYLENOL ) 325 MG tablet Take 650 mg by mouth 2 (two) times a week. As needed (Patient taking differently: Take 650 mg by mouth 2 (two) times a week. As needed)   atorvastatin  (LIPITOR ) 80 MG tablet TAKE 1 TABLET(80 MG) BY MOUTH AT BEDTIME   Cholecalciferol 50 MCG (2000 UT) TABS Take 1 tablet by mouth daily in the afternoon.   Ensure (ENSURE) Take 237 mLs by mouth daily.   ezetimibe  (ZETIA ) 10 MG tablet Take 1 tablet (10 mg total) by mouth daily.   JARDIANCE  10 MG TABS tablet TAKE 1 TABLET(10 MG) BY MOUTH DAILY   Lacosamide  (VIMPAT ) 100 MG TABS Take 1 tablet (100 mg total) by mouth 2 (two) times daily.   levETIRAcetam  (KEPPRA ) 750 MG tablet Take 2 tablets (1,500 mg total) by mouth 2 (two) times daily.   metoprolol  succinate (TOPROL -XL) 50 MG 24 hr tablet Take 0.5 tablets (25 mg total) by mouth daily. Take with or immediately following a meal.   nystatin  cream (MYCOSTATIN ) Apply 1 Application topically as needed for dry skin.   sacubitril -valsartan  (ENTRESTO ) 49-51 MG Take 1 tablet by mouth 2 (two) times daily.   spironolactone  (ALDACTONE ) 25 MG tablet TAKE 1 TABLET(25 MG) BY MOUTH DAILY   warfarin (COUMADIN ) 1 MG tablet Take 0.5 tablets (0.5 mg total) by mouth daily. Take along with 4 mg tablet daily except on Wednesday take 2.5 mg tablet   [DISCONTINUED] warfarin (COUMADIN ) 5 MG tablet TAKE 1 TABLET(5 MG) BY MOUTH DAILY   warfarin (COUMADIN ) 4 MG tablet Take 4.5 mg tablet by mouth every day except on Wednesday take 2.5 mg tablet   [DISCONTINUED] furosemide  (LASIX ) 20 MG tablet Take 2 tablets (40 mg total) by mouth daily as needed for fluid or edema. For swelling, or weight gain of 3 pounds overnight or 5 pounds in 1 week. (Patient not taking: Reported on 11/21/2023)   No facility-administered encounter medications on file as of 11/21/2023.    Review of Systems  Constitutional:  Negative for appetite change, chills, fatigue, fever and  unexpected weight change.  HENT:  Negative for congestion, ear discharge, ear pain, hearing loss, nosebleeds, postnasal drip, rhinorrhea, sinus pressure, sinus pain, sneezing, sore throat, tinnitus and trouble swallowing.   Eyes:  Negative for pain, discharge, redness, itching and visual disturbance.  Respiratory:  Negative for cough, chest tightness, shortness of breath and wheezing.   Cardiovascular:  Negative for chest pain, palpitations and leg swelling.  Gastrointestinal:  Negative for abdominal distention, abdominal pain, blood in stool, constipation, nausea and vomiting.  Genitourinary:  Negative for difficulty urinating, dysuria, flank pain, frequency and urgency.  Musculoskeletal:  Positive for gait problem. Negative for arthralgias, back pain, joint swelling and myalgias.  Skin:  Negative for color change, pallor, rash and wound.  Neurological:  Negative for dizziness, weakness, light-headedness, numbness and headaches.  Hematological:  Does not bruise/bleed easily.    Immunization History  Administered Date(s) Administered   Fluad Quad(high Dose 65+) 10/10/2018, 10/26/2020, 11/29/2021   Fluad Trivalent(High Dose 65+)  12/05/2022   INFLUENZA, HIGH DOSE SEASONAL PF 09/25/2023   Influenza,inj,Quad PF,6+ Mos 12/12/2012, 10/23/2013, 09/30/2014, 04/05/2016   PFIZER Comirnaty(Gray Top)Covid-19 Tri-Sucrose Vaccine 05/04/2020   PFIZER(Purple Top)SARS-COV-2 Vaccination 03/22/2019, 04/15/2019   Pneumococcal Conjugate-13 09/30/2014   Pneumococcal Polysaccharide-23 04/05/2016   Tdap 12/12/2012   Pertinent  Health Maintenance Due  Topic Date Due   Mammogram  12/12/2023   Influenza Vaccine  Completed   DEXA SCAN  Completed   Colonoscopy  Discontinued      06/25/2023    9:48 AM 08/02/2023    9:33 AM 08/09/2023    8:56 AM 10/23/2023    9:59 AM 11/21/2023    1:35 PM  Fall Risk  Falls in the past year? 0 0 0 0 0  Was there an injury with Fall? 1 0 0 1 0  Fall Risk Category Calculator 1 0  0 1 0  Patient at Risk for Falls Due to  No Fall Risks Impaired balance/gait;Impaired mobility  No Fall Risks  Fall risk Follow up   Falls evaluation completed  Falls evaluation completed   Functional Status Survey:    Vitals:   11/21/23 1338  BP: 122/72  Pulse: 68  Resp: 17  Temp: 97.6 F (36.4 C)  SpO2: 96%  Weight: 132 lb 3.2 oz (60 kg)  Height: 5' 7 (1.702 m)   Body mass index is 20.71 kg/m. Physical Exam VITALS: T- 97.6, P- 69, BP- 122/72 MEASUREMENTS: Weight- 132.2. GENERAL: Alert, cooperative, well developed, no acute distress HEENT: Normocephalic, normal oropharynx, moist mucous membranes, eyes normal CHEST: Clear to auscultation bilaterally, no wheezes, rhonchi, or crackles CARDIOVASCULAR: Normal heart rate and rhythm, S1 and S2 normal without murmurs ABDOMEN: Soft, non-tender, non-distended, without organomegaly, normal bowel sounds EXTREMITIES: No cyanosis or edema, without bruising NEUROLOGICAL: Cranial nerves grossly intact, moves all extremities without gross motor or sensory deficit   Labs reviewed: Recent Labs    05/16/23 1009 09/17/23 1107 09/26/23 0909  NA 137 138 140  K 4.2 4.1 4.4  CL 104 103 107  CO2 20* 21* 21*  GLUCOSE 77 84 98  BUN 25* 50* 14  CREATININE 1.39* 1.87* 1.26*  CALCIUM  9.1 9.2 9.0   No results for input(s): AST, ALT, ALKPHOS, BILITOT, PROT, ALBUMIN  in the last 8760 hours. No results for input(s): WBC, NEUTROABS, HGB, HCT, MCV, PLT in the last 8760 hours. Lab Results  Component Value Date   TSH 1.846 10/23/2013   Lab Results  Component Value Date   HGBA1C 6.1 (H) 06/06/2022   Lab Results  Component Value Date   CHOL 104 07/16/2023   HDL 40 (L) 07/16/2023   LDLCALC 51 07/16/2023   TRIG 65 07/16/2023   CHOLHDL 2.6 07/16/2023    Significant Diagnostic Results in last 30 days:  CUP PACEART REMOTE DEVICE CHECK Result Date: 10/30/2023 BIV PPM scheduled remote reviewed. Normal device function.   Presenting rhythm: AS-BVP Next remote transmission per protocol. AB, CVRS   Assessment/Plan    Chronic Anticoagulation Management INR is 1.8, below the target range of 2.5 to 3.5, indicating over-anticoagulation. No signs of bleeding such as epistaxis, hematuria, or melena. Blood pressure and heart rate are within normal limits. No recent falls or bruising. Current regimen of 2.5 mg on Wednesdays and 5 mg on other days is too thin. - Adjusted warfarin dosage to 2.5 mg on Wednesdays and 4.5 mg on other days. - Prescribed 1 mg warfarin tablets to facilitate accurate dosing. - Scheduled follow-up INR check in two weeks. -  Advised to avoid leafy green vegetables to prevent INR destabilization.   Family/ staff Communication: Reviewed plan of care with patient and Husband verbalized understanding   Labs/tests ordered: INR   Next Appointment: Return in about 2 weeks (around 12/05/2023) for INR check .   Total time: 20 minutes. Greater than 50% of total time spent doing patient education regarding Coumadin  monitoring ,health maintenance including symptom/medication management.   Roxan JAYSON Plough, NP

## 2023-12-01 ENCOUNTER — Other Ambulatory Visit: Payer: Self-pay | Admitting: Sports Medicine

## 2023-12-01 DIAGNOSIS — Z7901 Long term (current) use of anticoagulants: Secondary | ICD-10-CM

## 2023-12-02 NOTE — Telephone Encounter (Signed)
 Patient prescribed warfarin dose for 1 mg and 4 mg tablet no longer on 5 mg tablet.

## 2023-12-02 NOTE — Telephone Encounter (Signed)
 I am not seeing 5mg  on patients med list. Is it okay to refill ?

## 2023-12-05 ENCOUNTER — Other Ambulatory Visit (HOSPITAL_COMMUNITY): Payer: Self-pay

## 2023-12-05 ENCOUNTER — Ambulatory Visit: Admitting: Family

## 2023-12-05 ENCOUNTER — Encounter: Payer: Self-pay | Admitting: Family

## 2023-12-05 VITALS — BP 118/66 | HR 74 | Temp 97.6°F | Resp 17

## 2023-12-05 DIAGNOSIS — Z7901 Long term (current) use of anticoagulants: Secondary | ICD-10-CM | POA: Diagnosis not present

## 2023-12-05 LAB — POCT INR: POC INR: 1.8

## 2023-12-05 MED ORDER — WARFARIN SODIUM 4 MG PO TABS: ORAL_TABLET | ORAL | 0 refills | Status: DC

## 2023-12-05 NOTE — Patient Instructions (Signed)
-   continue on Coumadin  2.5 mg tablet on Wednesday and decrease 4.5 to 4 mg tablet on all other days.Monitor for any signs of bleeding and notify provider.

## 2023-12-05 NOTE — Progress Notes (Signed)
 Provider: Roxan Plough FNP-C  Olyn Landstrom, Roxan BROCKS, NP  Patient Care Team: Kenzie Thoreson, Roxan BROCKS, NP as PCP - General (Family Medicine) Inocencio Soyla Lunger, MD as PCP - Electrophysiology (Cardiology) Jeffrie Oneil BROCKS, MD as PCP - Cardiology (Cardiology) Ladona Heinz, MD as Attending Physician (Cardiology) Shepard Ade, MD (Internal Medicine) Raford Riggs, MD as Attending Physician (Cardiology) [name] Eduard SAUNDERS, MD as Consulting Physician (Neurology) Morgan [name] CROME, RN as [facility] Management  Extended Emergency Contact Information Primary Emergency Contact: [name],[name] Address: [address]          [place], [place] [address] [place] Home Phone: [phone] Mobile Phone: [phone] Relation: Spouse Secondary Emergency Contact: [name] Address: [address]          [place] [phone] [place] Home Phone: [phone] Relation: Daughter  Code Status: Full Code  Goals of care: Advanced Directive information    11/21/2023    1:35 PM  Advanced Directives  Does Patient Have a Medical Advance Directive? No  Would patient like information on creating a medical advance directive? No - Patient declined     Chief Complaint  Patient presents with   INR Check    Discussed the use of AI scribe software for clinical note transcription with the patient, who gave verbal consent to proceed.  History of Present Illness   [name] is a 73 year old female who presents for follow-up of her INR levels.  She is currently on Coumadin , taking 4.5 mg daily and 2.5 mg on Wednesdays. Her INR remains at 1.8. Previously, her Coumadin  dose was decreased from 5 mg to 4.5 mg on most days and 2.5 mg on Wednesdays.  No bleeding, epistaxis, blood in the stool, or easy bruising. There have been no changes in her diet or other medications. She confirms having enough 4 mg and 1 mg tablets of Coumadin .  No  episodes of falls, chest pain, or palpitations. Her caregiver mentions that she has regular bowel movements, although she has not moved her bowels today.  Her caregiver is managing her diet by limiting greens, allowing only a tablespoon on special occasions like Thanksgiving.    Past Medical History:  Diagnosis Date   Adenomatous polyp of colon 09/2012   repeat colonoscopy in 5 years by Dr. Jakie   CHF (congestive heart failure) (HCC)    EF 15-20% as of 05/28/11 (Dr Ozell Rigby-Hospital D/C summary)   CVA (cerebral infarction) 12/2007   H/O heart bypass surgery 2022   Hemiparesis (HCC)    Hyperlipidemia    Hypertension    Seizures (HCC)    Sickle cell anemia (HCC)    Stroke (HCC)    Vitamin D  deficiency 2010   Past Surgical History:  Procedure Laterality Date   ABDOMINAL HYSTERECTOMY     BIV PACEMAKER INSERTION CRT-P N/A 04/29/2020   Procedure: BIV PACEMAKER INSERTION CRT-P;  Surgeon: Inocencio Soyla Lunger, MD;  Location: [facility] INVASIVE CV LAB;  Service: Cardiovascular;  Laterality: N/A;   CORONARY ARTERY BYPASS GRAFT N/A 04/25/2020   Procedure: CORONARY ARTERY BYPASS GRAFTING (CABG) TIMES TWO USING LEFT INTERNAL MAMMARY ARTERY AND RIGHT GREATER SAPHENOUS VEIN HARVESTED ENDOSCOPICALLY;  Surgeon: Kerrin Elspeth BROCKS, MD;  Location: [facility] OR;  Service: Open Heart Surgery;  Laterality: N/A;   FRACTURE SURGERY     HIP SURGERY     LEFT HEART CATH AND CORONARY ANGIOGRAPHY N/A 04/22/2020   Procedure: LEFT HEART CATH AND CORONARY ANGIOGRAPHY;  Surgeon:  Rolan Ezra RAMAN, MD;  Location: [facility] INVASIVE CV LAB;  Service: Cardiovascular;  Laterality: N/A;   TEE WITHOUT CARDIOVERSION  04/25/2020   Procedure: TRANSESOPHAGEAL ECHOCARDIOGRAM (TEE);  Surgeon: Kerrin Elspeth BROCKS, MD;  Location: [facility] OR;  Service: Open Heart Surgery;;   TEMPORARY PACEMAKER N/A 04/21/2020   Procedure: TEMPORARY PACEMAKER;  Surgeon: Rolan Ezra RAMAN, MD;  Location: [facility] INVASIVE CV LAB;  Service: Cardiovascular;  Laterality: N/A;    TUBAL LIGATION      Allergies  Allergen Reactions   Lexiscan  [Regadenoson ] Other (See Comments)    Perioral tingling and throat tightness    Outpatient Encounter Medications as of 12/05/2023  Medication Sig   acetaminophen  (TYLENOL ) 325 MG tablet Take 650 mg by mouth 2 (two) times a week. As needed (Patient taking differently: Take 650 mg by mouth 2 (two) times a week. As needed)   atorvastatin  (LIPITOR ) 80 MG tablet TAKE 1 TABLET(80 MG) BY MOUTH AT BEDTIME   Cholecalciferol 50 MCG (2000 UT) TABS Take 1 tablet by mouth daily in the afternoon.   Ensure (ENSURE) Take 237 mLs by mouth daily.   ezetimibe  (ZETIA ) 10 MG tablet Take 1 tablet (10 mg total) by mouth daily.   JARDIANCE  10 MG TABS tablet TAKE 1 TABLET(10 MG) BY MOUTH DAILY   Lacosamide  (VIMPAT ) 100 MG TABS Take 1 tablet (100 mg total) by mouth 2 (two) times daily.   levETIRAcetam  (KEPPRA ) 750 MG tablet Take 2 tablets (1,500 mg total) by mouth 2 (two) times daily.   metoprolol  succinate (TOPROL -XL) 50 MG 24 hr tablet Take 0.5 tablets (25 mg total) by mouth daily. Take with or immediately following a meal.   nystatin  cream (MYCOSTATIN ) Apply 1 Application topically as needed for dry skin.   sacubitril -valsartan  (ENTRESTO ) 49-51 MG Take 1 tablet by mouth 2 (two) times daily.   spironolactone  (ALDACTONE ) 25 MG tablet TAKE 1 TABLET(25 MG) BY MOUTH DAILY   warfarin (COUMADIN ) 1 MG tablet Take 0.5 tablets (0.5 mg total) by mouth daily. Take along with 4 mg tablet daily except on Wednesday take 2.5 mg tablet   [DISCONTINUED] warfarin (COUMADIN ) 4 MG tablet Take 4.5 mg tablet by mouth every day except on Wednesday take 2.5 mg tablet   warfarin (COUMADIN ) 4 MG tablet Take 4 mg tablet by mouth every day except on Wednesday take 2.5 mg tablet   No facility-administered encounter medications on file as of 12/05/2023.    Review of Systems  Constitutional:  Negative for appetite change, chills, fatigue, fever and unexpected weight change.   HENT:  Negative for congestion, ear discharge, ear pain, hearing loss, nosebleeds, postnasal drip, rhinorrhea, sinus pressure, sinus pain, sneezing, sore throat, tinnitus and trouble swallowing.   Eyes:  Negative for pain, discharge, redness, itching and visual disturbance.  Respiratory:  Negative for cough, chest tightness, shortness of breath and wheezing.   Cardiovascular:  Negative for chest pain, palpitations and leg swelling.  Gastrointestinal:  Negative for abdominal distention, abdominal pain, blood in stool, constipation, diarrhea, nausea and vomiting.  Genitourinary:  Negative for difficulty urinating, dysuria, flank pain, frequency, hematuria and urgency.  Musculoskeletal:  Positive for gait problem. Negative for arthralgias, back pain, joint swelling and myalgias.  Skin:  Negative for color change, pallor, rash and wound.  Neurological:  Positive for speech difficulty. Negative for dizziness, weakness, light-headedness, numbness and headaches.  Hematological:  Does not bruise/bleed easily.    Immunization History  Administered Date(s) Administered   Fluad Quad(high Dose 65+) 10/10/2018, 10/26/2020, 11/29/2021   Fluad  Trivalent(High Dose 65+) 12/05/2022   INFLUENZA, HIGH DOSE SEASONAL PF 09/25/2023   Influenza,inj,Quad PF,6+ Mos 12/12/2012, 10/23/2013, 09/30/2014, 04/05/2016   PFIZER Comirnaty(Gray Top)Covid-19 Tri-Sucrose Vaccine 05/04/2020   PFIZER(Purple Top)SARS-COV-2 Vaccination 03/22/2019, 04/15/2019   Pneumococcal Conjugate-13 09/30/2014   Pneumococcal Polysaccharide-23 04/05/2016   Tdap 12/12/2012   Pertinent  Health Maintenance Due  Topic Date Due   Mammogram  12/12/2023   Influenza Vaccine  Completed   DEXA SCAN  Completed   Colonoscopy  Discontinued      06/25/2023    9:48 AM 08/02/2023    9:33 AM 08/09/2023    8:56 AM 10/23/2023    9:59 AM 11/21/2023    1:35 PM  Fall Risk  Falls in the past year? 0 0 0 0 0  Was there an injury with Fall? 1 0 0 1 0  Fall  Risk Category Calculator 1 0 0 1 0  Patient at Risk for Falls Due to  No Fall Risks Impaired balance/gait;Impaired mobility  No Fall Risks  Fall risk Follow up   Falls evaluation completed  Falls evaluation completed   Functional Status Survey:    Vitals:   12/05/23 0935  BP: 118/66  Pulse: 74  Resp: 17  Temp: 97.6 F (36.4 C)  SpO2: 98%   There is no height or weight on file to calculate BMI. Physical Exam VITALS: T- 97.6, P- 74, BP- 118/66, SaO2- 98% GENERAL: Alert, cooperative, well developed, no acute distress HEENT: Normocephalic, normal oropharynx, moist mucous membranes CHEST: Clear to auscultation bilaterally, no wheezes, rhonchi, or crackles CARDIOVASCULAR: Normal heart rate and rhythm, S1 and S2 normal without murmurs ABDOMEN: Soft, non-tender, non-distended, without organomegaly, normal bowel sounds EXTREMITIES: No cyanosis, edema, or swelling in legs NEUROLOGICAL: Cranial nerves grossly intact, moves all extremities without gross motor or sensory deficit      Labs reviewed: Recent Labs    05/16/23 1009 09/17/23 1107 09/26/23 0909  NA 137 138 140  K 4.2 4.1 4.4  CL 104 103 107  CO2 20* 21* 21*  GLUCOSE 77 84 98  BUN 25* 50* 14  CREATININE 1.39* 1.87* 1.26*  CALCIUM  9.1 9.2 9.0   No results for input(s): AST, ALT, ALKPHOS, BILITOT, PROT, ALBUMIN  in the last 8760 hours. No results for input(s): WBC, NEUTROABS, HGB, HCT, MCV, PLT in the last 8760 hours. Lab Results  Component Value Date   TSH 1.846 10/23/2013   Lab Results  Component Value Date   HGBA1C 6.1 (H) 06/06/2022   Lab Results  Component Value Date   CHOL 104 07/16/2023   HDL 40 (L) 07/16/2023   LDLCALC 51 07/16/2023   TRIG 65 07/16/2023   CHOLHDL 2.6 07/16/2023    Significant Diagnostic Results in last 30 days:  No results found.  Assessment/Plan  Chronic anticoagulation management INR remains low at 1.8 despite previous adjustment of Coumadin  dosage. No  signs of bleeding, bruising, or changes in diet or medication. Goal INR is 2.5 to 3.5. Current regimen includes 4.5 mg Coumadin  daily except 2.5 mg on Wednesdays. No adverse effects reported. - Adjusted Coumadin  dosage to 4 mg daily except 2.5 mg on Wednesdays. - Scheduled follow-up appointment in two weeks to recheck INR. - Advised to monitor for any signs of bleeding or bruising and report if she occurs. - Discussed dietary considerations, particularly regarding greens, to help stabilize INR levels.    Family/ staff Communication: Reviewed plan of care with patient and Husband verbalized understanding   Labs/tests ordered:  POC INR  Next Appointment: Return in about 2 weeks (around 12/19/2023) for INR check .   Total time: 20 minutes. Greater than 50% of total time spent doing patient education regarding Chronic anticoagulation management,health maintenance including symptom/medication management.   Roxan JAYSON Plough, NP

## 2023-12-10 ENCOUNTER — Encounter: Payer: Self-pay | Admitting: Podiatry

## 2023-12-10 ENCOUNTER — Ambulatory Visit: Admitting: Podiatry

## 2023-12-10 DIAGNOSIS — M79674 Pain in right toe(s): Secondary | ICD-10-CM

## 2023-12-10 DIAGNOSIS — B351 Tinea unguium: Secondary | ICD-10-CM | POA: Diagnosis not present

## 2023-12-10 DIAGNOSIS — M25371 Other instability, right ankle: Secondary | ICD-10-CM

## 2023-12-10 DIAGNOSIS — R269 Unspecified abnormalities of gait and mobility: Secondary | ICD-10-CM

## 2023-12-10 DIAGNOSIS — M79675 Pain in left toe(s): Secondary | ICD-10-CM | POA: Diagnosis not present

## 2023-12-13 ENCOUNTER — Other Ambulatory Visit (HOSPITAL_COMMUNITY): Payer: Self-pay | Admitting: Cardiology

## 2023-12-13 MED ORDER — SPIRONOLACTONE 25 MG PO TABS: ORAL_TABLET | ORAL | 11 refills | Status: AC

## 2023-12-16 NOTE — Progress Notes (Signed)
  Subjective:  Patient ID: [name], female    DOB: 06/29/1950,  MRN: [id number]  [name] presents to clinic today for at risk foot care with h/o clotting disorder and painful elongated mycotic toenails 1-5 bilaterally which are tender when wearing enclosed shoe gear. Pain is relieved with periodic professional debridement. She is accompanied by her husband on today's visit. Patient is requesting new ankle brace for LLE. Chief Complaint  Patient presents with   Diabetes    A1c 6.1 She saw Dr. Leonarda in Nov.    New problem(s): None.   PCP is Ngetich, Roxan BROCKS, NP.  Allergies  Allergen Reactions   Lexiscan  [Regadenoson ] Other (See Comments)    Perioral tingling and throat tightness    Review of Systems: Negative except as noted in the HPI.  Objective: No changes noted in today's physical examination. There were no vitals filed for this visit. [name] is a pleasant 73 y.o. female WD, WN in NAD. AAO x 3.  Vascular Examination: Capillary refill time immediate b/l. Vascular status intact b/l with palpable pedal pulses. Pedal hair present b/l. No pain with calf compression b/l. Skin temperature gradient WNL b/l. No cyanosis or clubbing b/l. No ischemia or gangrene noted b/l.   Neurological Examination: Sensation grossly intact b/l with 10 gram monofilament. Vibratory sensation intact b/l.   Dermatological Examination: Pedal skin with normal turgor, texture and tone b/l.  No open wounds. No interdigital macerations.   Toenails 1-5 b/l thick, discolored, elongated with subungual debris and pain on dorsal palpation.   No corns, calluses, nor porokeratotic lesions.  Musculoskeletal Examination: Instability of ankle joint, right.  Muscle strength 5/5 to all lower extremity muscle groups bilaterally. Patient in wheelchair.  Radiographs: None  Assessment/Plan: 1. Pain due to onychomycosis of toenails of both feet   2. Instability of ankle joint,  right   3. Abnormality of gait    Orders Placed This Encounter  Procedures   PR AFO ANKLE GAUNTLET PRE OTS  Patient was evaluated and treated. All patient's and/or POA's questions/concerns addressed on today's visit. Provided new ankle brace for RLE. Toenails 1-5 b/l debrided in length and girth without incident. Continue soft, supportive shoe gear daily. Report any pedal injuries to medical professional. Call office if there are any questions/concerns.  Return in about 3 months (around 03/11/2024).  Delon LITTIE Merlin, DPM      [place] LOCATION: [address], [place] [address]                   Office [phone]   [facility] LOCATION: [address] [place], [place] [address] Office [phone]

## 2023-12-24 ENCOUNTER — Ambulatory Visit: Admitting: Family

## 2023-12-24 ENCOUNTER — Other Ambulatory Visit: Payer: Self-pay | Admitting: Family

## 2023-12-24 ENCOUNTER — Encounter: Payer: Self-pay | Admitting: Family

## 2023-12-24 VITALS — BP 116/62 | HR 81 | Temp 97.5°F | Resp 17

## 2023-12-24 DIAGNOSIS — Z7901 Long term (current) use of anticoagulants: Secondary | ICD-10-CM | POA: Diagnosis not present

## 2023-12-24 DIAGNOSIS — I69959 Hemiplegia and hemiparesis following unspecified cerebrovascular disease affecting unspecified side: Secondary | ICD-10-CM

## 2023-12-24 LAB — POCT INR: POC INR: 1.3

## 2023-12-24 MED ORDER — WARFARIN SODIUM 4 MG PO TABS: ORAL_TABLET | ORAL | 3 refills | Status: DC

## 2023-12-24 NOTE — Progress Notes (Signed)
 Provider: Roxan Plough FNP-C  Tashona Calk, Roxan BROCKS, NP  Patient Care Team: Esthela Brandner, Roxan BROCKS, NP as PCP - General (Family Medicine) Inocencio Soyla Lunger, MD as PCP - Electrophysiology (Cardiology) Jeffrie Oneil BROCKS, MD as PCP - Cardiology (Cardiology) Ladona Heinz, MD as Attending Physician (Cardiology) Shepard Ade, MD (Internal Medicine) Raford Riggs, MD as Attending Physician (Cardiology) [name] Eduard SAUNDERS, MD as Consulting Physician (Neurology) Morgan [name] CROME, RN as [facility] Management  Extended Emergency Contact Information Primary Emergency Contact: Bartoszek,[name] Address: [address]          [place], [place] [address] [place] Home Phone: [phone] Mobile Phone: [phone] Relation: Spouse Secondary Emergency Contact: [name] Address: [address]          [place] [phone] [place] Home Phone: [phone] Relation: Daughter  Code Status: Full Code  Goals of care: Advanced Directive information    11/21/2023    1:35 PM  Advanced Directives  Does Patient Have a Medical Advance Directive? No  Would patient like information on creating a medical advance directive? No - Patient declined     Chief Complaint  Patient presents with   INR Check    Discussed the use of AI scribe software for clinical note transcription with the patient, who gave verbal consent to proceed.  History of Present Illness   [name] is a 73 year old female who presents for INR recheck.  On November 13th, her INR was 1.8, leading to an adjustment in her Coumadin  dosage to 4 mg daily, except for 2.5 mg on Wednesdays. Today, her INR has decreased to 1.3. She reports no changes in medication adherence and denies missing any doses.  She mentions consuming a spoonful of collard greens on Sunday, which is the only dietary change she recalls. No bleeding issues, bruising, or changes in health status. She  has not been consistently measuring her vegetable intake.  Her current medications include Tylenol  as needed, atorvastatin  80 mg, vitamin D  2000 IU, Ensure nutritional drink once daily, Zetia  10 mg, Vimpat  100 mg twice daily, Keppra  1500 mg twice daily, metoprolol  25 mg, nystatin  cream, Entresto , and spironolactone  25 mg. She confirms having enough medication supplies and no new allergies or changes in her medical history.  No recent illness, exposure to sick individuals, or symptoms such as cough, fever, or aches. No issues with urination, including no hematuria, and maintains a clear urine output.   Past Medical History:  Diagnosis Date   Adenomatous polyp of colon 09/2012   repeat colonoscopy in 5 years by Dr. Jakie   CHF (congestive heart failure) (HCC)    EF 15-20% as of 05/28/11 (Dr Ozell Rigby-Hospital D/C summary)   CVA (cerebral infarction) 12/2007   H/O heart bypass surgery 2022   Hemiparesis (HCC)    Hyperlipidemia    Hypertension    Seizures (HCC)    Sickle cell anemia (HCC)    Stroke (HCC)    Vitamin D  deficiency 2010   Past Surgical History:  Procedure Laterality Date   ABDOMINAL HYSTERECTOMY     BIV PACEMAKER INSERTION CRT-P N/A 04/29/2020   Procedure: BIV PACEMAKER INSERTION CRT-P;  Surgeon: Inocencio Soyla Lunger, MD;  Location: [facility] INVASIVE CV LAB;  Service: Cardiovascular;  Laterality: N/A;   CORONARY ARTERY BYPASS GRAFT N/A 04/25/2020   Procedure: CORONARY ARTERY BYPASS GRAFTING (CABG) TIMES TWO USING LEFT INTERNAL MAMMARY ARTERY AND RIGHT GREATER SAPHENOUS VEIN HARVESTED ENDOSCOPICALLY;  Surgeon: Kerrin Standing  C, MD;  Location: [facility] OR;  Service: Open Heart Surgery;  Laterality: N/A;   FRACTURE SURGERY     HIP SURGERY     LEFT HEART CATH AND CORONARY ANGIOGRAPHY N/A 04/22/2020   Procedure: LEFT HEART CATH AND CORONARY ANGIOGRAPHY;  Surgeon: Rolan Ezra RAMAN, MD;  Location: [facility] INVASIVE CV LAB;  Service: Cardiovascular;  Laterality: N/A;   TEE WITHOUT CARDIOVERSION   04/25/2020   Procedure: TRANSESOPHAGEAL ECHOCARDIOGRAM (TEE);  Surgeon: Kerrin Elspeth BROCKS, MD;  Location: [facility] OR;  Service: Open Heart Surgery;;   TEMPORARY PACEMAKER N/A 04/21/2020   Procedure: TEMPORARY PACEMAKER;  Surgeon: Rolan Ezra RAMAN, MD;  Location: [facility] INVASIVE CV LAB;  Service: Cardiovascular;  Laterality: N/A;   TUBAL LIGATION      Allergies  Allergen Reactions   Lexiscan  [Regadenoson ] Other (See Comments)    Perioral tingling and throat tightness    Outpatient Encounter Medications as of 12/24/2023  Medication Sig   acetaminophen  (TYLENOL ) 325 MG tablet Take 650 mg by mouth 2 (two) times a week. As needed (Patient taking differently: Take 650 mg by mouth 2 (two) times a week. As needed)   atorvastatin  (LIPITOR ) 80 MG tablet TAKE 1 TABLET(80 MG) BY MOUTH AT BEDTIME   Cholecalciferol 50 MCG (2000 UT) TABS Take 1 tablet by mouth daily in the afternoon.   Ensure (ENSURE) Take 237 mLs by mouth daily.   ezetimibe  (ZETIA ) 10 MG tablet Take 1 tablet (10 mg total) by mouth daily.   JARDIANCE  10 MG TABS tablet TAKE 1 TABLET(10 MG) BY MOUTH DAILY   Lacosamide  (VIMPAT ) 100 MG TABS Take 1 tablet (100 mg total) by mouth 2 (two) times daily.   levETIRAcetam  (KEPPRA ) 750 MG tablet Take 2 tablets (1,500 mg total) by mouth 2 (two) times daily.   metoprolol  succinate (TOPROL -XL) 50 MG 24 hr tablet Take 0.5 tablets (25 mg total) by mouth daily. Take with or immediately following a meal.   nystatin  cream (MYCOSTATIN ) Apply 1 Application topically as needed for dry skin.   sacubitril -valsartan  (ENTRESTO ) 49-51 MG Take 1 tablet by mouth 2 (two) times daily.   spironolactone  (ALDACTONE ) 25 MG tablet TAKE 1 TABLET(25 MG) BY MOUTH DAILY   warfarin (COUMADIN ) 1 MG tablet Take 0.5 tablets (0.5 mg total) by mouth daily. Take along with 4 mg tablet daily except on Wednesday take 2.5 mg tablet   warfarin (COUMADIN ) 4 MG tablet Take 4.5 mg tablet one by mouth daily except 2.5 tablet on Wednesday    [DISCONTINUED] warfarin (COUMADIN ) 4 MG tablet Take 4 mg tablet by mouth every day except on Wednesday take 2.5 mg tablet   No facility-administered encounter medications on file as of 12/24/2023.    Review of Systems  Constitutional:  Negative for appetite change, chills, fatigue, fever and unexpected weight change.  HENT:  Negative for congestion, dental problem, ear discharge, ear pain, facial swelling, hearing loss, nosebleeds, postnasal drip, rhinorrhea, sinus pressure, sinus pain, sneezing, sore throat, tinnitus and trouble swallowing.   Eyes:  Negative for pain, discharge, redness, itching and visual disturbance.  Respiratory:  Negative for cough, chest tightness, shortness of breath and wheezing.   Cardiovascular:  Negative for chest pain, palpitations and leg swelling.  Gastrointestinal:  Negative for abdominal distention, abdominal pain, blood in stool, constipation, diarrhea, nausea and vomiting.  Endocrine: Negative for cold intolerance, heat intolerance, polydipsia, polyphagia and polyuria.  Genitourinary:  Negative for difficulty urinating, dysuria, flank pain, frequency and urgency.  Musculoskeletal:  Positive for gait problem. Negative for arthralgias,  back pain, joint swelling, myalgias, neck pain and neck stiffness.  Skin:  Negative for color change, pallor, rash and wound.  Neurological:  Negative for dizziness, syncope, speech difficulty, weakness, light-headedness, numbness and headaches.  Hematological:  Does not bruise/bleed easily.  Psychiatric/Behavioral:  Negative for agitation, behavioral problems, confusion, hallucinations, self-injury, sleep disturbance and suicidal ideas. The patient is not nervous/anxious.     Immunization History  Administered Date(s) Administered   Fluad Quad(high Dose 65+) 10/10/2018, 10/26/2020, 11/29/2021   Fluad Trivalent(High Dose 65+) 12/05/2022   INFLUENZA, HIGH DOSE SEASONAL PF 09/25/2023   Influenza,inj,Quad PF,6+ Mos 12/12/2012,  10/23/2013, 09/30/2014, 04/05/2016   PFIZER Comirnaty(Gray Top)Covid-19 Tri-Sucrose Vaccine 05/04/2020   PFIZER(Purple Top)SARS-COV-2 Vaccination 03/22/2019, 04/15/2019   Pneumococcal Conjugate-13 09/30/2014   Pneumococcal Polysaccharide-23 04/05/2016   Tdap 12/12/2012   Pertinent  Health Maintenance Due  Topic Date Due   Mammogram  12/12/2023   Influenza Vaccine  Completed   Bone Density Scan  Completed   Colonoscopy  Discontinued      06/25/2023    9:48 AM 08/02/2023    9:33 AM 08/09/2023    8:56 AM 10/23/2023    9:59 AM 11/21/2023    1:35 PM  Fall Risk  Falls in the past year? 0 0 0 0 0  Was there an injury with Fall? 1  0  0  1  0   Fall Risk Category Calculator 1 0 0 1 0  Patient at Risk for Falls Due to  No Fall Risks Impaired balance/gait;Impaired mobility  No Fall Risks  Fall risk Follow up   Falls evaluation completed  Falls evaluation completed     Data saved with a previous flowsheet row definition   Functional Status Survey:    Vitals:   12/24/23 0910 12/24/23 0952  BP: 116/62   Pulse: 81   Resp: 17   Temp: 99.1 F (37.3 C) (!) 97.5 F (36.4 C)  SpO2: 99%    There is no height or weight on file to calculate BMI. Physical Exam  VITALS: T- 97.5, P- 81, BP- 116/62, SaO2- 99% GENERAL: Alert, cooperative, well developed, no acute distress. HEENT: Normocephalic, normal oropharynx, moist mucous membranes, ears without infection, clean, nose normal, no sinus tenderness. CHEST: Clear to auscultation bilaterally, no wheezes, rhonchi, or crackles. CARDIOVASCULAR: Normal heart rate and rhythm, S1 and S2 normal without murmurs. ABDOMEN: Soft, non-tender, non-distended, without organomegaly, normal bowel sounds. RECTAL: No blood in urine. EXTREMITIES: No cyanosis, edema, swelling, or bruising. NEUROLOGICAL: Cranial nerves grossly intact, moves all extremities without gross motor or sensory deficit.   Labs reviewed: Recent Labs    05/16/23 1009 09/17/23 1107  09/26/23 0909  NA 137 138 140  K 4.2 4.1 4.4  CL 104 103 107  CO2 20* 21* 21*  GLUCOSE 77 84 98  BUN 25* 50* 14  CREATININE 1.39* 1.87* 1.26*  CALCIUM  9.1 9.2 9.0   No results for input(s): AST, ALT, ALKPHOS, BILITOT, PROT, ALBUMIN  in the last 8760 hours. No results for input(s): WBC, NEUTROABS, HGB, HCT, MCV, PLT in the last 8760 hours. Lab Results  Component Value Date   TSH 1.846 10/23/2013   Lab Results  Component Value Date   HGBA1C 6.1 (H) 06/06/2022   Lab Results  Component Value Date   CHOL 104 07/16/2023   HDL 40 (L) 07/16/2023   LDLCALC 51 07/16/2023   TRIG 65 07/16/2023   CHOLHDL 2.6 07/16/2023    Significant Diagnostic Results in last 30 days:  No results found.  Assessment/Plan  Anticoagulation management INR decreased to 1.3, indicating subtherapeutic anticoagulation. No bleeding or bruising reported. Recent dietary intake of collard greens may have contributed to the decrease in INR. No missed doses of Coumadin  reported. - Adjusted Coumadin  dosage to 4.5 mg daily, except 2.5 mg on Wednesdays. - Will recheck INR in one week. - Advised to avoid high vitamin K  foods such as collard greens, okra, and squash.   Hemiplegia dominant side as late effect following cerebrovascular disease  - continue on anticoagulation Warfarin  - continue on atorvastatin    Family/ staff Communication: Reviewed plan of care with patient verbalized understanding   Labs/tests ordered:  POC INR   Next Appointment: Return in about 1 week (around 12/31/2023) for INR.  Total time: 20 minutes. Greater than 50% of total time spent doing patient education regarding Anticoagulation management, Hemiplegia dominant side as late effect following cerebrovascular disease and health maintenance including symptom/medication management.   Roxan JAYSON Plough, NP

## 2023-12-24 NOTE — Telephone Encounter (Signed)
 High Risk Warning Populated when attempting to refill, I will send to Provider for further review

## 2023-12-31 ENCOUNTER — Ambulatory Visit: Admitting: Family

## 2024-01-01 ENCOUNTER — Ambulatory Visit: Admitting: Family

## 2024-01-01 ENCOUNTER — Encounter: Payer: Self-pay | Admitting: Family

## 2024-01-01 VITALS — BP 114/70 | HR 63 | Temp 97.7°F | Resp 17

## 2024-01-01 DIAGNOSIS — Z7901 Long term (current) use of anticoagulants: Secondary | ICD-10-CM

## 2024-01-01 LAB — POCT INR: INR: 1.5 — AB (ref 2.0–3.0)

## 2024-01-01 MED ORDER — WARFARIN SODIUM 5 MG PO TABS: ORAL_TABLET | ORAL | 3 refills | Status: DC

## 2024-01-01 MED ORDER — WARFARIN SODIUM 3 MG PO TABS: ORAL_TABLET | ORAL | 3 refills | Status: DC

## 2024-01-02 NOTE — Progress Notes (Signed)
 Provider: Roxan Plough FNP-C   Kellyann Ordway, Roxan BROCKS, NP  Patient Care Team: Kirah Stice, Roxan BROCKS, NP as PCP - General (Family Medicine) Inocencio Soyla Lunger, MD as PCP - Electrophysiology (Cardiology) Jeffrie Oneil BROCKS, MD as PCP - Cardiology (Cardiology) Ladona Heinz, MD as Attending Physician (Cardiology) Shepard Ade, MD (Internal Medicine) Raford Riggs, MD as Attending Physician (Cardiology) [name] Eduard SAUNDERS, MD as Consulting Physician (Neurology) Morgan [name] CROME, RN as [facility] Management  Extended Emergency Contact Information Primary Emergency Contact: Hinnenkamp,[name] Address: [address]          [place], [place] [address] [place] Home Phone: [phone] Mobile Phone: [phone] Relation: Spouse Secondary Emergency Contact: [name] Address: [address]          [place] [phone] [place] Home Phone: [phone] Relation: Daughter  Code Status: Full code Goals of care: Advanced Directive information    11/21/2023    1:35 PM  Advanced Directives  Does Patient Have a Medical Advance Directive? No  Would patient like information on creating a medical advance directive? No - Patient declined     Chief Complaint  Patient presents with   INR Check     Discussed the use of AI scribe software for clinical note transcription with the patient, who gave verbal consent to proceed.  History of Present Illness   [name] is a 73 year old female who presents for follow-up of INR levels.  Her INR levels have been gradually increasing but are not yet at the therapeutic goal, with a progression from 1.3 to 1.5, and currently at 1.8. She is taking 4.5 mg of Coumadin  daily, with a reduced dose of 2.5 mg on Wednesdays. She has not been consuming any green vegetables since her last visit, which is relevant to her INR management.  No issues with bleeding, including no bleeding when brushing her  teeth. She experienced a bout of diarrhea after consuming ice cream, but there was no blood or melena in her stool.  Her past medical history includes a stroke.   Past Medical History:  Diagnosis Date   Adenomatous polyp of colon 09/2012   repeat colonoscopy in 5 years by Dr. Jakie   CHF (congestive heart failure) (HCC)    EF 15-20% as of 05/28/11 (Dr Ozell Rigby-Hospital D/C summary)   CVA (cerebral infarction) 12/2007   H/O heart bypass surgery 2022   Hemiparesis (HCC)    Hyperlipidemia    Hypertension    Seizures (HCC)    Sickle cell anemia (HCC)    Stroke (HCC)    Vitamin D  deficiency 2010   Past Surgical History:  Procedure Laterality Date   ABDOMINAL HYSTERECTOMY     BIV PACEMAKER INSERTION CRT-P N/A 04/29/2020   Procedure: BIV PACEMAKER INSERTION CRT-P;  Surgeon: Inocencio Soyla Lunger, MD;  Location: [facility] INVASIVE CV LAB;  Service: Cardiovascular;  Laterality: N/A;   CORONARY ARTERY BYPASS GRAFT N/A 04/25/2020   Procedure: CORONARY ARTERY BYPASS GRAFTING (CABG) TIMES TWO USING LEFT INTERNAL MAMMARY ARTERY AND RIGHT GREATER SAPHENOUS VEIN HARVESTED ENDOSCOPICALLY;  Surgeon: Kerrin Elspeth BROCKS, MD;  Location: [facility] OR;  Service: Open Heart Surgery;  Laterality: N/A;   FRACTURE SURGERY     HIP SURGERY     LEFT HEART CATH AND CORONARY ANGIOGRAPHY N/A 04/22/2020   Procedure: LEFT HEART CATH AND CORONARY ANGIOGRAPHY;  Surgeon: Rolan Ezra RAMAN, MD;  Location: [facility] INVASIVE CV LAB;  Service: Cardiovascular;  Laterality: N/A;  TEE WITHOUT CARDIOVERSION  04/25/2020   Procedure: TRANSESOPHAGEAL ECHOCARDIOGRAM (TEE);  Surgeon: Kerrin Elspeth BROCKS, MD;  Location: [facility] OR;  Service: Open Heart Surgery;;   TEMPORARY PACEMAKER N/A 04/21/2020   Procedure: TEMPORARY PACEMAKER;  Surgeon: Rolan Ezra RAMAN, MD;  Location: [facility] INVASIVE CV LAB;  Service: Cardiovascular;  Laterality: N/A;   TUBAL LIGATION      Allergies[1]  Allergies as of 01/01/2024       Reactions   Lexiscan  [regadenoson ] Other  (See Comments)   Perioral tingling and throat tightness        Medication List        Accurate as of January 01, 2024 11:59 PM. If you have any questions, ask your nurse or doctor.          acetaminophen  325 MG tablet Commonly known as: TYLENOL  Take 650 mg by mouth 2 (two) times a week. As needed   atorvastatin  80 MG tablet Commonly known as: LIPITOR  TAKE 1 TABLET(80 MG) BY MOUTH AT BEDTIME   Cholecalciferol 50 MCG (2000 UT) Tabs Take 1 tablet by mouth daily in the afternoon.   Ensure Take 237 mLs by mouth daily.   ezetimibe  10 MG tablet Commonly known as: ZETIA  Take 1 tablet (10 mg total) by mouth daily.   Jardiance  10 MG Tabs tablet Generic drug: empagliflozin  TAKE 1 TABLET(10 MG) BY MOUTH DAILY   Lacosamide  100 MG Tabs Commonly known as: Vimpat  Take 1 tablet (100 mg total) by mouth 2 (two) times daily.   levETIRAcetam  750 MG tablet Commonly known as: KEPPRA  Take 2 tablets (1,500 mg total) by mouth 2 (two) times daily.   metoprolol  succinate 50 MG 24 hr tablet Commonly known as: TOPROL -XL Take 0.5 tablets (25 mg total) by mouth daily. Take with or immediately following a meal.   nystatin  cream Commonly known as: MYCOSTATIN  Apply 1 Application topically as needed for dry skin.   sacubitril -valsartan  49-51 MG Commonly known as: Entresto  Take 1 tablet by mouth 2 (two) times daily.   spironolactone  25 MG tablet Commonly known as: ALDACTONE  TAKE 1 TABLET(25 MG) BY MOUTH DAILY   warfarin 5 MG tablet Commonly known as: COUMADIN  Take 5 mg tablet one by mouth daily except 3mg  tablet on Wednesday What changed:  medication strength additional instructions Changed by: Roxan Plough, NP   warfarin 3 MG tablet Commonly known as: COUMADIN  One tablet by mouth on Wednesday long with 5 mg tablet daily What changed:  medication strength See the new instructions. Changed by: Roxan Plough, NP        Review of Systems  Constitutional:  Negative for  appetite change, chills, fatigue, fever and unexpected weight change.  HENT:  Negative for congestion, dental problem, ear discharge, ear pain, hearing loss, nosebleeds, postnasal drip, rhinorrhea, sinus pressure, sinus pain, sneezing, sore throat, tinnitus and trouble swallowing.   Eyes:  Negative for pain, discharge, redness, itching and visual disturbance.  Respiratory:  Negative for cough, chest tightness, shortness of breath and wheezing.   Cardiovascular:  Negative for chest pain, palpitations and leg swelling.  Gastrointestinal:  Negative for abdominal distention, abdominal pain, blood in stool, constipation, diarrhea, nausea and vomiting.  Genitourinary:  Negative for difficulty urinating, dysuria, flank pain, frequency and urgency.  Musculoskeletal:  Positive for gait problem. Negative for arthralgias, back pain, joint swelling and myalgias.  Skin:  Negative for color change, pallor, rash and wound.  Neurological:  Positive for speech difficulty and weakness. Negative for dizziness, syncope, light-headedness, numbness and headaches.  Right-sided weakness  Hematological:  Does not bruise/bleed easily.    Immunization History  Administered Date(s) Administered   Fluad Quad(high Dose 65+) 10/10/2018, 10/26/2020, 11/29/2021   Fluad Trivalent(High Dose 65+) 12/05/2022   INFLUENZA, HIGH DOSE SEASONAL PF 09/25/2023   Influenza,inj,Quad PF,6+ Mos 12/12/2012, 10/23/2013, 09/30/2014, 04/05/2016   PFIZER Comirnaty(Gray Top)Covid-19 Tri-Sucrose Vaccine 05/04/2020   PFIZER(Purple Top)SARS-COV-2 Vaccination 03/22/2019, 04/15/2019   Pneumococcal Conjugate-13 09/30/2014   Pneumococcal Polysaccharide-23 04/05/2016   Tdap 12/12/2012   Pertinent  Health Maintenance Due  Topic Date Due   Mammogram  12/12/2023   Influenza Vaccine  Completed   Bone Density Scan  Completed   Colonoscopy  Discontinued      06/25/2023    9:48 AM 08/02/2023    9:33 AM 08/09/2023    8:56 AM 10/23/2023    9:59 AM  11/21/2023    1:35 PM  Fall Risk  Falls in the past year? 0 0 0 0 0  Was there an injury with Fall? 1  0  0  1  0   Fall Risk Category Calculator 1 0 0 1 0  Patient at Risk for Falls Due to  No Fall Risks Impaired balance/gait;Impaired mobility  No Fall Risks  Fall risk Follow up   Falls evaluation completed  Falls evaluation completed     Data saved with a previous flowsheet row definition   Functional Status Survey:    Vitals:   01/01/24 0936  BP: 114/70  Pulse: 63  Resp: 17  Temp: 97.7 F (36.5 C)  SpO2: 100%   There is no height or weight on file to calculate BMI. Physical Exam VITALS: T- 97.7, P- 63, BP- 114/70, SaO2- 100% GENERAL: Alert, cooperative, well developed, no acute distress HEENT: Normocephalic, normal oropharynx, moist mucous membranes CHEST: Clear to auscultation bilaterally, no wheezes, rhonchi, or crackles, no tenderness on back CARDIOVASCULAR: Normal heart rate and rhythm, S1 and S2 normal without murmurs ABDOMEN: Soft, non-tender, non-distended, without organomegaly, normal bowel sounds EXTREMITIES: No cyanosis, edema, or swelling in legs NEUROLOGICAL: Cranial nerves grossly intact, Right side Weakness SKIN: Without bruises   Labs reviewed: Recent Labs    05/16/23 1009 09/17/23 1107 09/26/23 0909  NA 137 138 140  K 4.2 4.1 4.4  CL 104 103 107  CO2 20* 21* 21*  GLUCOSE 77 84 98  BUN 25* 50* 14  CREATININE 1.39* 1.87* 1.26*  CALCIUM  9.1 9.2 9.0   No results for input(s): AST, ALT, ALKPHOS, BILITOT, PROT, ALBUMIN  in the last 8760 hours. No results for input(s): WBC, NEUTROABS, HGB, HCT, MCV, PLT in the last 8760 hours. Lab Results  Component Value Date   TSH 1.846 10/23/2013   Lab Results  Component Value Date   HGBA1C 6.1 (H) 06/06/2022   Lab Results  Component Value Date   CHOL 104 07/16/2023   HDL 40 (L) 07/16/2023   LDLCALC 51 07/16/2023   TRIG 65 07/16/2023   CHOLHDL 2.6 07/16/2023    Significant  Diagnostic Results in last 30 days:  No results found.  Assessment/Plan  Anticoagulation management for history of cerebral infarction INR has improved to 1.8 but is not yet at the target range of 2.5 to 3.5. No bleeding issues reported. No recent intake of green vegetables, which is beneficial for INR stability. - Increased Coumadin  dosage to 5 mg daily, except 3 mg on Wednesdays. - Sent prescription for 5 mg Coumadin  to pharmacy. - Scheduled follow-up appointment in two weeks to reassess INR.  Hemiplegia following cerebral  infarction, right dominant side Residual effects on the right side, particularly affecting finger movement. No new neurological deficits reported. - Encouraged gentle exercise for affected fingers to maintain mobility without causing injury.   Family/ staff Communication: Reviewed plan of care with patient and husband verbalized understanding  Labs/tests ordered:  POC INR   Next Appointment : Return in about 2 weeks (around 01/15/2024) for Check INR .  Spent 20 minutes of Face to face and non-face to face with patient  >50% time spent counseling; reviewing medical record; tests; labs; documentation and developing future plan of care.   Lynsey Ange C Hazel Wrinkle, NP      [1]  Allergies Allergen Reactions   Lexiscan  [Regadenoson ] Other (See Comments)    Perioral tingling and throat tightness

## 2024-01-09 ENCOUNTER — Other Ambulatory Visit (HOSPITAL_COMMUNITY): Payer: Self-pay

## 2024-01-09 MED ORDER — EMPAGLIFLOZIN 10 MG PO TABS: 10.0000 mg | ORAL_TABLET | Freq: Every day | ORAL | 11 refills | Status: AC

## 2024-01-17 ENCOUNTER — Ambulatory Visit: Admitting: Family

## 2024-01-17 ENCOUNTER — Encounter: Payer: Self-pay | Admitting: Family

## 2024-01-17 VITALS — BP 104/62 | HR 72 | Temp 97.5°F | Ht 67.0 in

## 2024-01-17 DIAGNOSIS — Z7901 Long term (current) use of anticoagulants: Secondary | ICD-10-CM | POA: Diagnosis not present

## 2024-01-17 DIAGNOSIS — Z8673 Personal history of transient ischemic attack (TIA), and cerebral infarction without residual deficits: Secondary | ICD-10-CM

## 2024-01-17 LAB — POCT INR: INR: 1.6 — AB (ref 2.0–3.0)

## 2024-01-17 NOTE — Progress Notes (Signed)
 "  Provider: Lynell Greenhouse FNP-C   Amahd Morino, Roxan BROCKS, NP  Patient Care Team: Emila Steinhauser, Roxan BROCKS, NP as PCP - General (Family Medicine) Inocencio Soyla Lunger, MD as PCP - Electrophysiology (Cardiology) Jeffrie Oneil BROCKS, MD as PCP - Cardiology (Cardiology) Ladona Heinz, MD as Attending Physician (Cardiology) Shepard Ade, MD (Internal Medicine) Raford Riggs, MD as Attending Physician (Cardiology) [name] Eduard SAUNDERS, MD as Consulting Physician (Neurology) Morgan [name] CROME, RN as [facility] Management  Extended Emergency Contact Information Primary Emergency Contact: Fifita,[name] Address: [address]          [place], [place] [address] [place] Home Phone: [phone] Mobile Phone: [phone] Relation: Spouse Secondary Emergency Contact: [name] Address: [address]          [place] [id number] [place] Home Phone: [phone] Relation: Daughter  Code Status:  Full Code  Goals of care: Advanced Directive information    11/21/2023    1:35 PM  Advanced Directives  Does Patient Have a Medical Advance Directive? No  Would patient like information on creating a medical advance directive? No - Patient declined     Chief Complaint  Patient presents with   follow up medication management of chronic issues.     Patient presents today for INR check. Per care gap patient needs cologuard testing as well.     HPI:  Pt is a 73 y.o. female seen today for INR check.Here with Husband who provides HPI information.Has a significant medical history of stroke.she was here 01/01/2024 INR was 1.8 coumadin  was increased from 4.5 mg to 5 mg tablet daily and continue on 2.5 mg Wednesday. Has tried to limit green vegetables in the diet.Her INR goal is 2.5 to 3.5.  She denies any issues with bleeding or dark colored stool.   Past Medical History:  Diagnosis Date   Adenomatous polyp of colon 09/2012   repeat colonoscopy in 5  years by Dr. Jakie   CHF (congestive heart failure) (HCC)    EF 15-20% as of 05/28/11 (Dr Ozell Rigby-Hospital D/C summary)   CVA (cerebral infarction) 12/2007   H/O heart bypass surgery 2022   Hemiparesis (HCC)    Hyperlipidemia    Hypertension    Seizures (HCC)    Sickle cell anemia (HCC)    Stroke (HCC)    Vitamin D  deficiency 2010   Past Surgical History:  Procedure Laterality Date   ABDOMINAL HYSTERECTOMY     BIV PACEMAKER INSERTION CRT-P N/A 04/29/2020   Procedure: BIV PACEMAKER INSERTION CRT-P;  Surgeon: Inocencio Soyla Lunger, MD;  Location: [facility] INVASIVE CV LAB;  Service: Cardiovascular;  Laterality: N/A;   CORONARY ARTERY BYPASS GRAFT N/A 04/25/2020   Procedure: CORONARY ARTERY BYPASS GRAFTING (CABG) TIMES TWO USING LEFT INTERNAL MAMMARY ARTERY AND RIGHT GREATER SAPHENOUS VEIN HARVESTED ENDOSCOPICALLY;  Surgeon: Kerrin Elspeth BROCKS, MD;  Location: [facility] OR;  Service: Open Heart Surgery;  Laterality: N/A;   FRACTURE SURGERY     HIP SURGERY     LEFT HEART CATH AND CORONARY ANGIOGRAPHY N/A 04/22/2020   Procedure: LEFT HEART CATH AND CORONARY ANGIOGRAPHY;  Surgeon: Rolan Ezra RAMAN, MD;  Location: [facility] INVASIVE CV LAB;  Service: Cardiovascular;  Laterality: N/A;   TEE WITHOUT CARDIOVERSION  04/25/2020   Procedure: TRANSESOPHAGEAL ECHOCARDIOGRAM (TEE);  Surgeon: Kerrin Elspeth BROCKS, MD;  Location: [facility] OR;  Service: Open Heart Surgery;;   TEMPORARY PACEMAKER N/A 04/21/2020   Procedure: TEMPORARY PACEMAKER;  Surgeon: Rolan Ezra RAMAN, MD;  Location: [facility] INVASIVE CV LAB;  Service: Cardiovascular;  Laterality: N/A;   TUBAL LIGATION      Allergies[1]  Allergies as of 01/17/2024       Reactions   Lexiscan  [regadenoson ] Other (See Comments)   Perioral tingling and throat tightness        Medication List        Accurate as of January 17, 2024 10:35 AM. If you have any questions, ask your nurse or doctor.          acetaminophen  325 MG tablet Commonly known as: TYLENOL  Take 650  mg by mouth 2 (two) times a week. As needed   atorvastatin  80 MG tablet Commonly known as: LIPITOR  TAKE 1 TABLET(80 MG) BY MOUTH AT BEDTIME   Cholecalciferol 50 MCG (2000 UT) Tabs Take 1 tablet by mouth daily in the afternoon.   empagliflozin  10 MG Tabs tablet Commonly known as: Jardiance  Take 1 tablet (10 mg total) by mouth daily.   Ensure Take 237 mLs by mouth daily.   ezetimibe  10 MG tablet Commonly known as: ZETIA  Take 1 tablet (10 mg total) by mouth daily.   Lacosamide  100 MG Tabs Commonly known as: Vimpat  Take 1 tablet (100 mg total) by mouth 2 (two) times daily.   levETIRAcetam  750 MG tablet Commonly known as: KEPPRA  Take 2 tablets (1,500 mg total) by mouth 2 (two) times daily.   metoprolol  succinate 50 MG 24 hr tablet Commonly known as: TOPROL -XL Take 0.5 tablets (25 mg total) by mouth daily. Take with or immediately following a meal.   nystatin  cream Commonly known as: MYCOSTATIN  Apply 1 Application topically as needed for dry skin.   sacubitril -valsartan  49-51 MG Commonly known as: Entresto  Take 1 tablet by mouth 2 (two) times daily.   spironolactone  25 MG tablet Commonly known as: ALDACTONE  TAKE 1 TABLET(25 MG) BY MOUTH DAILY   warfarin 5 MG tablet Commonly known as: COUMADIN  Take 5 mg tablet one by mouth daily except 3mg  tablet on Wednesday What changed: Another medication with the same name was removed. Continue taking this medication, and follow the directions you see here. Changed by: Raybon Conard, NP   warfarin 3 MG tablet Commonly known as: COUMADIN  One tablet by mouth on Wednesday long with 5 mg tablet daily What changed: Another medication with the same name was removed. Continue taking this medication, and follow the directions you see here. Changed by: Roxan Plough, NP        Review of Systems  Constitutional:  Negative for appetite change, chills, fatigue, fever and unexpected weight change.  HENT:  Negative for congestion, dental  problem, ear discharge, ear pain, hearing loss, nosebleeds, postnasal drip, rhinorrhea, sinus pressure, sinus pain, sneezing, sore throat and tinnitus.   Eyes:  Negative for pain, discharge, redness, itching and visual disturbance.  Respiratory:  Negative for cough, chest tightness, shortness of breath and wheezing.   Cardiovascular:  Negative for chest pain, palpitations and leg swelling.  Gastrointestinal:  Negative for abdominal distention, abdominal pain, blood in stool, constipation, diarrhea, nausea and vomiting.  Genitourinary:  Negative for difficulty urinating, dysuria, flank pain, frequency, hematuria and urgency.  Musculoskeletal:  Positive for gait problem. Negative for arthralgias, back pain, joint swelling and myalgias.  Skin:  Negative for color change, pallor, rash and wound.  Neurological:  Positive for speech difficulty. Negative for dizziness, light-headedness and headaches.       Right sided weakness   Hematological:  Does not bruise/bleed easily.    Immunization History  Administered Date(s) Administered   Fluad Quad(high Dose 65+) 10/10/2018, 10/26/2020, 11/29/2021   Fluad Trivalent(High Dose 65+) 12/05/2022   INFLUENZA, HIGH DOSE SEASONAL PF 09/25/2023   Influenza,inj,Quad PF,6+ Mos 12/12/2012, 10/23/2013, 09/30/2014, 04/05/2016   PFIZER Comirnaty(Gray Top)Covid-19 Tri-Sucrose Vaccine 05/04/2020   PFIZER(Purple Top)SARS-COV-2 Vaccination 03/22/2019, 04/15/2019   Pneumococcal Conjugate-13 09/30/2014   Pneumococcal Polysaccharide-23 04/05/2016   Tdap 12/12/2012   Pertinent  Health Maintenance Due  Topic Date Due   Influenza Vaccine  Completed   Bone Density Scan  Completed   Mammogram  Discontinued   Colonoscopy  Discontinued      08/02/2023    9:33 AM 08/09/2023    8:56 AM 10/23/2023    9:59 AM 11/21/2023    1:35 PM 01/17/2024   10:06 AM  Fall Risk  Falls in the past year? 0 0 0 0 0  Was there an injury with Fall? 0  0  1  0  0  Fall Risk Category  Calculator 0 0 1 0 0  Patient at Risk for Falls Due to No Fall Risks Impaired balance/gait;Impaired mobility  No Fall Risks No Fall Risks  Fall risk Follow up  Falls evaluation completed  Falls evaluation completed Falls evaluation completed     Data saved with a previous flowsheet row definition   Functional Status Survey:    Vitals:   01/17/24 1010  BP: 104/62  Pulse: 72  Temp: (!) 97.5 F (36.4 C)  SpO2: 97%  Height: 5' 7 (1.702 m)   Body mass index is 20.71 kg/m. Physical Exam Vitals reviewed.  Constitutional:      General: She is not in acute distress.    Appearance: Normal appearance. She is normal weight. She is not ill-appearing or diaphoretic.  HENT:     Head: Normocephalic.     Nose: Nose normal. No congestion or rhinorrhea.     Mouth/Throat:     Mouth: Mucous membranes are moist.     Pharynx: Oropharynx is clear. No oropharyngeal exudate or posterior oropharyngeal erythema.  Neck:     Vascular: No carotid bruit.  Cardiovascular:     Rate and Rhythm: Normal rate and regular rhythm.     Pulses: Normal pulses.     Heart sounds: Normal heart sounds. No murmur heard.    No friction rub. No gallop.  Pulmonary:     Effort: Pulmonary effort is normal. No respiratory distress.     Breath sounds: Normal breath sounds. No wheezing, rhonchi or rales.  Chest:     Chest wall: No tenderness.  Abdominal:     General: Bowel sounds are normal. There is no distension.     Palpations: Abdomen is soft. There is no mass.     Tenderness: There is no abdominal tenderness. There is no right CVA tenderness, left CVA tenderness, guarding or rebound.  Musculoskeletal:        General: No swelling or tenderness.     Cervical back: Normal range of motion. No rigidity or tenderness.     Right lower leg: No edema.     Left lower leg: No edema.     Comments: Right UE weakness   Lymphadenopathy:     Cervical: No cervical adenopathy.  Skin:    General: Skin is warm and dry.      Coloration: Skin is not pale.     Findings: No bruising, erythema, lesion or rash.  Neurological:     Mental Status: She is alert and oriented to person, place, and  time.     Cranial Nerves: No cranial nerve deficit.     Sensory: No sensory deficit.     Coordination: Coordination normal.     Gait: Gait abnormal.     Comments: Right side weakness   Psychiatric:        Mood and Affect: Mood normal.        Speech: Speech normal.        Behavior: Behavior normal.    Labs reviewed: Recent Labs    05/16/23 1009 09/17/23 1107 09/26/23 0909  NA 137 138 140  K 4.2 4.1 4.4  CL 104 103 107  CO2 20* 21* 21*  GLUCOSE 77 84 98  BUN 25* 50* 14  CREATININE 1.39* 1.87* 1.26*  CALCIUM  9.1 9.2 9.0   No results for input(s): AST, ALT, ALKPHOS, BILITOT, PROT, ALBUMIN  in the last 8760 hours. No results for input(s): WBC, NEUTROABS, HGB, HCT, MCV, PLT in the last 8760 hours. Lab Results  Component Value Date   TSH 1.846 10/23/2013   Lab Results  Component Value Date   HGBA1C 6.1 (H) 06/06/2022   Lab Results  Component Value Date   CHOL 104 07/16/2023   HDL 40 (L) 07/16/2023   LDLCALC 51 07/16/2023   TRIG 65 07/16/2023   CHOLHDL 2.6 07/16/2023    Significant Diagnostic Results in last 30 days:  No results found.  Assessment/Plan 1. Long term (current) use of anticoagulants (Primary) - POC INR 1.6 previous was 1.8 goal 2.5 - 3.5  Will obtain lab to confirm INR  Continue on coumadin  5 mg tablet daily and increase coumadin  2.5 mg tablet to 3 mg on Wednesday  - Protime-INR - schedule for INR check in 3 weeks  - Advised to afford leafy green vegetables to prevent INR destabilization   2. Anticoagulation monitoring, INR range 2.5-3.5 Not at goal  - continue on Warfarin 5 mg tablet one by mouth daily except Wednesday. - Increase warfarin from 2.5 mg tablet to 3 mg tablet one by mouth on Wednesday.  Will confirm INR with lab draw  3. History of CVA  (cerebrovascular accident) With right side weakness  - continue on Atorvastatin ,Zetia , Entresto  and Warfarin for anticoagulation.   Family/ staff Communication: Reviewed plan of care with patient and Husband verbalized understanding   Labs/tests ordered:  - Protime-INR  Next Appointment : Return in about 3 weeks (around 02/07/2024) for INR .  Spent 20 minutes of Face to face and non-face to face with patient  >50% time spent counseling; reviewing medical record; tests; labs; documentation and developing future plan of care.   Tippi Mccrae C Quade Ramirez, NP      [1]  Allergies Allergen Reactions   Lexiscan  [Regadenoson ] Other (See Comments)    Perioral tingling and throat tightness   "

## 2024-01-17 NOTE — Patient Instructions (Signed)
-   continue on Warfarin 5 mg tablet one by mouth daily except Wednesday. - Increase warfarin from 2.5 mg tablet to 3 mg tablet one by mouth on Wednesday.

## 2024-01-18 LAB — PROTIME-INR
INR: 1.6 — ABNORMAL HIGH
Prothrombin Time: 16.4 s — ABNORMAL HIGH (ref 9.0–11.5)

## 2024-01-20 ENCOUNTER — Ambulatory Visit: Payer: Self-pay | Admitting: Family

## 2024-01-27 ENCOUNTER — Ambulatory Visit: Payer: Medicare HMO

## 2024-01-27 DIAGNOSIS — I5022 Chronic systolic (congestive) heart failure: Secondary | ICD-10-CM | POA: Diagnosis not present

## 2024-01-29 ENCOUNTER — Ambulatory Visit: Payer: Self-pay | Admitting: Cardiology

## 2024-01-29 ENCOUNTER — Other Ambulatory Visit: Payer: Self-pay | Admitting: Family

## 2024-01-29 DIAGNOSIS — Z7901 Long term (current) use of anticoagulants: Secondary | ICD-10-CM

## 2024-01-29 LAB — CUP PACEART REMOTE DEVICE CHECK
Battery Remaining Longevity: 44 mo
Battery Remaining Percentage: 48 %
Battery Voltage: 2.98 V
Brady Statistic AP VP Percent: 9.1 %
Brady Statistic AP VS Percent: 1 %
Brady Statistic AS VP Percent: 90 %
Brady Statistic AS VS Percent: 1 %
Brady Statistic RA Percent Paced: 8.6 %
Date Time Interrogation Session: 20260105025806
Implantable Lead Connection Status: 753985
Implantable Lead Connection Status: 753985
Implantable Lead Connection Status: 753985
Implantable Lead Implant Date: 20220411
Implantable Lead Implant Date: 20220411
Implantable Lead Implant Date: 20220411
Implantable Lead Location: 753858
Implantable Lead Location: 753859
Implantable Lead Location: 753860
Implantable Pulse Generator Implant Date: 20220411
Lead Channel Impedance Value: 360 Ohm
Lead Channel Impedance Value: 410 Ohm
Lead Channel Impedance Value: 530 Ohm
Lead Channel Pacing Threshold Amplitude: 0.75 V
Lead Channel Pacing Threshold Amplitude: 0.75 V
Lead Channel Pacing Threshold Amplitude: 0.75 V
Lead Channel Pacing Threshold Pulse Width: 0.5 ms
Lead Channel Pacing Threshold Pulse Width: 0.5 ms
Lead Channel Pacing Threshold Pulse Width: 0.5 ms
Lead Channel Sensing Intrinsic Amplitude: 3.4 mV
Lead Channel Sensing Intrinsic Amplitude: 9.6 mV
Lead Channel Setting Pacing Amplitude: 2 V
Lead Channel Setting Pacing Amplitude: 2 V
Lead Channel Setting Pacing Amplitude: 2 V
Lead Channel Setting Pacing Pulse Width: 0.5 ms
Lead Channel Setting Pacing Pulse Width: 0.5 ms
Lead Channel Setting Sensing Sensitivity: 4 mV
Pulse Gen Model: 3562
Pulse Gen Serial Number: 6262141

## 2024-01-31 NOTE — Progress Notes (Signed)
 Remote PPM Transmission

## 2024-02-05 ENCOUNTER — Other Ambulatory Visit: Payer: Self-pay | Admitting: Family Medicine

## 2024-02-05 MED ORDER — LACOSAMIDE 100 MG PO TABS: 100.0000 mg | ORAL_TABLET | Freq: Two times a day (BID) | ORAL | 1 refills | Status: AC

## 2024-02-05 NOTE — Telephone Encounter (Signed)
 Requested Prescriptions   Pending Prescriptions Disp Refills   Lacosamide  (VIMPAT ) 100 MG TABS 180 tablet 1    Sig: Take 1 tablet (100 mg total) by mouth 2 (two) times daily.   Last seen 06/11/23 Next appt 06/16/24  Dispenses  No dispenses within 180 days of the adherence period (since 02/09/2023)  I am unable to check dispense history   The last note from amy stated: Below is our plan:   We will continue levetiracetam  1500mg  and lacosamide  100mg  twice daily

## 2024-02-05 NOTE — Telephone Encounter (Signed)
 Spouse states [facility] is asking for a new Rx for pt's VIMPAT  100 MG TABS

## 2024-02-07 ENCOUNTER — Telehealth: Payer: Self-pay

## 2024-02-07 ENCOUNTER — Ambulatory Visit (INDEPENDENT_AMBULATORY_CARE_PROVIDER_SITE_OTHER): Admitting: Family

## 2024-02-07 ENCOUNTER — Encounter: Payer: Self-pay | Admitting: Family

## 2024-02-07 VITALS — BP 100/62 | HR 66 | Temp 97.1°F | Ht 67.0 in

## 2024-02-07 DIAGNOSIS — I69959 Hemiplegia and hemiparesis following unspecified cerebrovascular disease affecting unspecified side: Secondary | ICD-10-CM | POA: Diagnosis not present

## 2024-02-07 DIAGNOSIS — Z95811 Presence of heart assist device: Secondary | ICD-10-CM | POA: Diagnosis not present

## 2024-02-07 DIAGNOSIS — K219 Gastro-esophageal reflux disease without esophagitis: Secondary | ICD-10-CM | POA: Diagnosis not present

## 2024-02-07 DIAGNOSIS — G40909 Epilepsy, unspecified, not intractable, without status epilepticus: Secondary | ICD-10-CM | POA: Diagnosis not present

## 2024-02-07 DIAGNOSIS — N1831 Chronic kidney disease, stage 3a: Secondary | ICD-10-CM | POA: Diagnosis not present

## 2024-02-07 DIAGNOSIS — Z7901 Long term (current) use of anticoagulants: Secondary | ICD-10-CM | POA: Diagnosis not present

## 2024-02-07 LAB — POCT INR: INR: 1.9 — AB (ref 2.0–3.0)

## 2024-02-07 MED ORDER — WARFARIN SODIUM 3 MG PO TABS: ORAL_TABLET | ORAL | 3 refills | Status: AC

## 2024-02-07 MED ORDER — WARFARIN SODIUM 5 MG PO TABS: ORAL_TABLET | ORAL | 3 refills | Status: AC

## 2024-02-07 MED ORDER — OMEPRAZOLE 20 MG PO CPDR: 20.0000 mg | DELAYED_RELEASE_CAPSULE | Freq: Every day | ORAL | 3 refills | Status: AC | PRN

## 2024-02-07 NOTE — Telephone Encounter (Signed)
 Thank you :)

## 2024-02-07 NOTE — Progress Notes (Signed)
 "  Provider: Kemari Narez FNP-C  Lamario Mani, Roxan BROCKS, NP  Patient Care Team: Lizette Pazos, Roxan BROCKS, NP as PCP - General (Family Medicine) Inocencio Soyla Lunger, MD as PCP - Electrophysiology (Cardiology) Jeffrie Oneil BROCKS, MD as PCP - Cardiology (Cardiology) Ladona Heinz, MD as Attending Physician (Cardiology) Shepard Ade, MD (Internal Medicine) Raford Riggs, MD as Attending Physician (Cardiology) [name] Eduard SAUNDERS, MD as Consulting Physician (Neurology) Morgan [name] CROME, RN as [facility] Management  Extended Emergency Contact Information Primary Emergency Contact: Trosper,[name] Address: [address]          [place], [place] [address] [place] Home Phone: [phone] Mobile Phone: [phone] Relation: Spouse Secondary Emergency Contact: [name] Address: [address]          [place] [phone] [place] Home Phone: [phone] Relation: Daughter  Code Status: Full code Goals of care: Advanced Directive information    11/21/2023    1:35 PM  Advanced Directives  Does Patient Have a Medical Advance Directive? No  Would patient like information on creating a medical advance directive? No - Patient declined     Chief Complaint  Patient presents with   medication management of chronic issues    3 week follow up. INR due. Discuss the need for cologuard    History of Present Illness   [name] is a 74 year old female with a history of stroke who presents for follow-up on INR levels. She is accompanied by her husband, who provides HPI information.  She has a history of stroke and is currently on warfarin for anticoagulation. Her INR was previously checked on January 17, 2024, and was 1.6, below her goal range of 2.5 to 3.5. Today, her INR has increased to 1.9 but remains below the target range.  She is compliant with her medication regimen, including warfarin, atorvastatin , Zetia , and Triostat.  She avoids dark green vegetables to prevent INR destabilization. Her husband confirms her compliance with warfarin.  She experienced heartburn this morning, which is unusual for her. She has not taken any medication for heartburn in the past.  No bleeding issues, including epistaxis, bleeding gums, or blood in the stool. She takes Coumadin  5 mg daily and 3 mg on Wednesdays.   Past Medical History:  Diagnosis Date   Adenomatous polyp of colon 09/2012   repeat colonoscopy in 5 years by Dr. Jakie   CHF (congestive heart failure) (HCC)    EF 15-20% as of 05/28/11 (Dr Ozell Rigby-Hospital D/C summary)   CVA (cerebral infarction) 12/2007   H/O heart bypass surgery 2022   Hemiparesis (HCC)    Hyperlipidemia    Hypertension    Seizures (HCC)    Sickle cell anemia (HCC)    Stroke (HCC)    Vitamin D  deficiency 2010   Past Surgical History:  Procedure Laterality Date   ABDOMINAL HYSTERECTOMY     BIV PACEMAKER INSERTION CRT-P N/A 04/29/2020   Procedure: BIV PACEMAKER INSERTION CRT-P;  Surgeon: Inocencio Soyla Lunger, MD;  Location: [facility] INVASIVE CV LAB;  Service: Cardiovascular;  Laterality: N/A;   CORONARY ARTERY BYPASS GRAFT N/A 04/25/2020   Procedure: CORONARY ARTERY BYPASS GRAFTING (CABG) TIMES TWO USING LEFT INTERNAL MAMMARY ARTERY AND RIGHT GREATER SAPHENOUS VEIN HARVESTED ENDOSCOPICALLY;  Surgeon: Kerrin Elspeth BROCKS, MD;  Location: [facility] OR;  Service: Open Heart Surgery;  Laterality: N/A;   FRACTURE SURGERY     HIP SURGERY     LEFT HEART CATH AND CORONARY ANGIOGRAPHY  N/A 04/22/2020   Procedure: LEFT HEART CATH AND CORONARY ANGIOGRAPHY;  Surgeon: Rolan Ezra RAMAN, MD;  Location: [facility] INVASIVE CV LAB;  Service: Cardiovascular;  Laterality: N/A;   TEE WITHOUT CARDIOVERSION  04/25/2020   Procedure: TRANSESOPHAGEAL ECHOCARDIOGRAM (TEE);  Surgeon: Kerrin Elspeth BROCKS, MD;  Location: [facility] OR;  Service: Open Heart Surgery;;   TEMPORARY PACEMAKER N/A 04/21/2020   Procedure: TEMPORARY PACEMAKER;  Surgeon:  Rolan Ezra RAMAN, MD;  Location: [facility] INVASIVE CV LAB;  Service: Cardiovascular;  Laterality: N/A;   TUBAL LIGATION      Allergies[1]  Outpatient Encounter Medications as of 02/07/2024  Medication Sig   acetaminophen  (TYLENOL ) 325 MG tablet Take 650 mg by mouth 2 (two) times a week. As needed   atorvastatin  (LIPITOR ) 80 MG tablet TAKE 1 TABLET(80 MG) BY MOUTH AT BEDTIME   Cholecalciferol 50 MCG (2000 UT) TABS Take 1 tablet by mouth daily in the afternoon.   empagliflozin  (JARDIANCE ) 10 MG TABS tablet Take 1 tablet (10 mg total) by mouth daily.   Ensure (ENSURE) Take 237 mLs by mouth daily.   ezetimibe  (ZETIA ) 10 MG tablet Take 1 tablet (10 mg total) by mouth daily.   Lacosamide  (VIMPAT ) 100 MG TABS Take 1 tablet (100 mg total) by mouth 2 (two) times daily.   levETIRAcetam  (KEPPRA ) 750 MG tablet Take 2 tablets (1,500 mg total) by mouth 2 (two) times daily.   metoprolol  succinate (TOPROL -XL) 50 MG 24 hr tablet Take 0.5 tablets (25 mg total) by mouth daily. Take with or immediately following a meal.   nystatin  cream (MYCOSTATIN ) Apply 1 Application topically as needed for dry skin.   omeprazole  (PRILOSEC) 20 MG capsule Take 1 capsule (20 mg total) by mouth daily as needed.   sacubitril -valsartan  (ENTRESTO ) 49-51 MG Take 1 tablet by mouth 2 (two) times daily.   spironolactone  (ALDACTONE ) 25 MG tablet TAKE 1 TABLET(25 MG) BY MOUTH DAILY   [DISCONTINUED] warfarin (COUMADIN ) 3 MG tablet One tablet by mouth on Wednesday long with 5 mg tablet daily   [DISCONTINUED] warfarin (COUMADIN ) 5 MG tablet Take 5 mg tablet one by mouth daily except 3mg  tablet on Wednesday   warfarin (COUMADIN ) 3 MG tablet 3.5 mg tablet by mouth on Wednesday along with 5 mg tablet daily   warfarin (COUMADIN ) 5 MG tablet Take 5 mg tablet one by mouth daily except 3.5 mg tablet on Wednesday   No facility-administered encounter medications on file as of 02/07/2024.    Review of Systems  Constitutional:  Negative for appetite  change, chills, fatigue, fever and unexpected weight change.  HENT:  Positive for hearing loss. Negative for congestion, ear discharge, ear pain, nosebleeds, postnasal drip, rhinorrhea, sinus pressure, sinus pain, sneezing, sore throat, tinnitus and trouble swallowing.   Eyes:  Negative for pain, discharge, redness, itching and visual disturbance.  Respiratory:  Negative for cough, chest tightness, shortness of breath and wheezing.   Cardiovascular:  Negative for chest pain, palpitations and leg swelling.  Gastrointestinal:  Negative for abdominal distention, abdominal pain, blood in stool, constipation, diarrhea, nausea and vomiting.  Genitourinary:  Negative for difficulty urinating, dysuria, flank pain, frequency, hematuria and urgency.  Musculoskeletal:  Positive for gait problem. Negative for arthralgias, back pain, joint swelling and myalgias.  Skin:  Negative for color change, pallor, rash and wound.  Neurological:  Positive for weakness. Negative for dizziness, syncope, speech difficulty, light-headedness and headaches.       Right side weakness  Hematological:  Does not bruise/bleed easily.    Immunization  History  Administered Date(s) Administered   Fluad Quad(high Dose 65+) 10/10/2018, 10/26/2020, 11/29/2021   Fluad Trivalent(High Dose 65+) 12/05/2022   INFLUENZA, HIGH DOSE SEASONAL PF 09/25/2023   Influenza,inj,Quad PF,6+ Mos 12/12/2012, 10/23/2013, 09/30/2014, 04/05/2016   PFIZER Comirnaty(Gray Top)Covid-19 Tri-Sucrose Vaccine 05/04/2020   PFIZER(Purple Top)SARS-COV-2 Vaccination 03/22/2019, 04/15/2019   Pneumococcal Conjugate-13 09/30/2014   Pneumococcal Polysaccharide-23 04/05/2016   Tdap 12/12/2012   Pertinent  Health Maintenance Due  Topic Date Due   Influenza Vaccine  Completed   Bone Density Scan  Completed   Mammogram  Discontinued   Colonoscopy  Discontinued      08/09/2023    8:56 AM 10/23/2023    9:59 AM 11/21/2023    1:35 PM 01/17/2024   10:06 AM 02/07/2024     9:47 AM  Fall Risk  Falls in the past year? 0 0 0 0 0  Was there an injury with Fall? 0  1  0  0 0  Fall Risk Category Calculator 0 1 0 0 0  Patient at Risk for Falls Due to Impaired balance/gait;Impaired mobility  No Fall Risks No Fall Risks No Fall Risks  Fall risk Follow up Falls evaluation completed  Falls evaluation completed Falls evaluation completed Falls evaluation completed     Data saved with a previous flowsheet row definition   Functional Status Survey:    Vitals:   02/06/24 1637  BP: 100/62  Pulse: 66  Temp: (!) 97.1 F (36.2 C)  SpO2: 99%  Height: 5' 7 (1.702 m)   Body mass index is 20.71 kg/m. Physical Exam  VITALS: T- 97.1, P- 66, BP- 100/62, SaO2- 99% GENERAL: Alert, cooperative, well developed, no acute distress HEENT: Normocephalic, normal oropharynx, moist mucous membranes CHEST: Clear to auscultation bilaterally, no wheezes, rhonchi, or crackles CARDIOVASCULAR: Regular rate and rhythm, S1 and S2 normal without murmurs ABDOMEN: Soft, non-tender, non-distended, without organomegaly, normal bowel sounds EXTREMITIES: No cyanosis, edema, or swelling NEUROLOGICAL: Cranial nerves grossly intact, moves all extremities without gross motor or sensory deficit except right side weakness and unsteady gait   Labs reviewed: Recent Labs    05/16/23 1009 09/17/23 1107 09/26/23 0909  NA 137 138 140  K 4.2 4.1 4.4  CL 104 103 107  CO2 20* 21* 21*  GLUCOSE 77 84 98  BUN 25* 50* 14  CREATININE 1.39* 1.87* 1.26*  CALCIUM  9.1 9.2 9.0   No results for input(s): AST, ALT, ALKPHOS, BILITOT, PROT, ALBUMIN  in the last 8760 hours. No results for input(s): WBC, NEUTROABS, HGB, HCT, MCV, PLT in the last 8760 hours. Lab Results  Component Value Date   TSH 1.846 10/23/2013   Lab Results  Component Value Date   HGBA1C 6.1 (H) 06/06/2022   Lab Results  Component Value Date   CHOL 104 07/16/2023   HDL 40 (L) 07/16/2023   LDLCALC 51  07/16/2023   TRIG 65 07/16/2023   CHOLHDL 2.6 07/16/2023    Significant Diagnostic Results in last 30 days:  CUP PACEART REMOTE DEVICE CHECK Result Date: 01/29/2024 BIV PPM Scheduled remote reviewed. Normal device function.  Presenting rhythm: AS/BIV paced HF diagnostics have been abnormal this monitoring period. Next remote transmission per protocol. [place], CVRS   Assessment/Plan  Anticoagulation management for hemiplegia following cerebral infarction Right side weakness  INR increased to 1.9 from 1.6, still below the therapeutic range of 2.5 to 3.5. No bleeding complications reported. Compliance with warfarin regimen confirmed. Dietary adherence to avoid dark green vegetables noted. - Continue warfarin 5 mg daily  and increase 3 mg tablet to 3.5 mg on Wednesday. - Avoid dark green vegetables to prevent INR destabilization. - Scheduled follow-up in three weeks to recheck INR.  Gastroesophageal reflux disease New onset heartburn reported this morning. No prior history of heartburn. No current medication for heartburn. - Recommended omeprazole  as needed for heartburn. - Educated on over-the-counter options for heartburn management.   CKD stage 3  - latest CR 1.26 slightly improved previous 1.87 and 1.39  - Will continue to avoid Nephrotoxins and dose all other medication for renal clearance   Seizure disorder  - No seizure reported  - continue to follow up with Neurologist  - continue with safety precaution   Family/ staff Communication: Reviewed plan of care with patient and husband verbalized understanding  Labs/tests ordered:  POC INR   Next Appointment: Return in about 3 weeks (around 02/28/2024) for INR check .  Total time: 20 minutes. Greater than 50% of total time spent doing patient education regarding Anticoagulation management for hemiplegia following cerebral infarction,health maintenance including symptom/medication management.   Nicolas Sisler C Alyus Mofield, NP     [1]   Allergies Allergen Reactions   Lexiscan  [Regadenoson ] Other (See Comments)    Perioral tingling and throat tightness   "

## 2024-02-07 NOTE — Telephone Encounter (Signed)
 Spoke with nick from pharmacy and provided him with the instructions ,he verbalized understanding .

## 2024-02-07 NOTE — Telephone Encounter (Signed)
 Coumadin  5 mg tablet one by mouth daily except on Wednesday take 3.5 mg tablet.Patient has one mg tablet at home will break in half.

## 2024-02-07 NOTE — Telephone Encounter (Signed)
 Copied from CRM #[id number]. Topic: Clinical - Prescription Issue >> Feb 07, 2024 10:45 AM Debby BROCKS wrote: Reason for CRM: Karleen from [facility] pharmacy called in to verify instructions on  warfarin (COUMADIN ) 3 MG tablet for the patient.  Its a 3mg  tablet but it states patient needs to take 3.5mg  tablets which they dont make.  Callback: [phone]

## 2024-02-10 ENCOUNTER — Telehealth: Payer: Self-pay | Admitting: Cardiology

## 2024-02-10 ENCOUNTER — Telehealth (HOSPITAL_COMMUNITY): Payer: Self-pay

## 2024-02-10 NOTE — Telephone Encounter (Signed)
 Advanced Heart Failure Patient Advocate Encounter  The patient was approved for a [employer] grant that will help cover the cost of Entresto , Jardiance , Metoprolol , Spironolactone .  Total amount awarded, $7,500.  Effective: 01/11/2024 - 01/09/2025.  BIN [id number] PCN [id number] [id number] ID [id number]  Pharmacy provided with approval and processing information. Confirmed $0 copay for Jardiance . Patient spouse informed via phone.  Rachel DEL, CPhT Rx Patient Advocate Phone: [phone]

## 2024-02-10 NOTE — Telephone Encounter (Signed)
 Pt's spouse states grant no longer on file at [id number] - [place], [place] - [address]   Pt's spouse would like a c/b regarding this matter Please advise.

## 2024-02-12 ENCOUNTER — Other Ambulatory Visit (HOSPITAL_COMMUNITY): Payer: Self-pay

## 2024-02-12 DIAGNOSIS — I959 Hypotension, unspecified: Secondary | ICD-10-CM

## 2024-02-12 NOTE — Telephone Encounter (Signed)
 Pt's grant matter has already been addressed.

## 2024-02-14 ENCOUNTER — Telehealth (HOSPITAL_COMMUNITY): Payer: Self-pay

## 2024-02-14 DIAGNOSIS — I959 Hypotension, unspecified: Secondary | ICD-10-CM

## 2024-02-14 MED ORDER — METOPROLOL SUCCINATE ER 50 MG PO TB24: 25.0000 mg | ORAL_TABLET | Freq: Every day | ORAL | 0 refills | Status: AC

## 2024-02-14 NOTE — Telephone Encounter (Signed)
 Pt's  Metoprolol  medication has been sent to pharmacy.

## 2024-02-16 DIAGNOSIS — N1831 Chronic kidney disease, stage 3a: Secondary | ICD-10-CM | POA: Insufficient documentation

## 2024-02-16 DIAGNOSIS — Z95811 Presence of heart assist device: Secondary | ICD-10-CM | POA: Insufficient documentation

## 2024-02-27 ENCOUNTER — Ambulatory Visit: Admitting: Family

## 2024-03-02 ENCOUNTER — Ambulatory Visit: Admitting: Family

## 2024-03-25 ENCOUNTER — Ambulatory Visit: Admitting: Podiatry

## 2024-04-07 ENCOUNTER — Ambulatory Visit: Payer: Self-pay | Admitting: Family

## 2024-06-16 ENCOUNTER — Ambulatory Visit: Admitting: Family Medicine
